# Patient Record
Sex: Male | Born: 1955 | Race: Black or African American | Hispanic: No | Marital: Single | Smoking: Never smoker
Health system: Southern US, Community
[De-identification: ages and names within clinical notes are randomized; demographics above are authoritative.]

## PROBLEM LIST (undated history)

## (undated) DIAGNOSIS — M1990 Unspecified osteoarthritis, unspecified site: Secondary | ICD-10-CM

## (undated) DIAGNOSIS — I7101 Dissection of ascending aorta: Secondary | ICD-10-CM

## (undated) DIAGNOSIS — I82409 Acute embolism and thrombosis of unspecified deep veins of unspecified lower extremity: Secondary | ICD-10-CM

## (undated) DIAGNOSIS — N200 Calculus of kidney: Secondary | ICD-10-CM

## (undated) DIAGNOSIS — O2230 Deep phlebothrombosis in pregnancy, unspecified trimester: Secondary | ICD-10-CM

## (undated) DIAGNOSIS — E119 Type 2 diabetes mellitus without complications: Secondary | ICD-10-CM

## (undated) DIAGNOSIS — E785 Hyperlipidemia, unspecified: Secondary | ICD-10-CM

## (undated) DIAGNOSIS — I10 Essential (primary) hypertension: Secondary | ICD-10-CM

## (undated) HISTORY — PX: DENTAL SURGERY: SHX609

## (undated) HISTORY — PX: REPAIR THORACIC AORTA: SUR1211

## (undated) HISTORY — PX: CARDIAC SURGERY: SHX584

---

## 1997-07-01 ENCOUNTER — Emergency Department (HOSPITAL_COMMUNITY): Admission: EM | Admit: 1997-07-01 | Discharge: 1997-07-01 | Payer: Self-pay | Admitting: Emergency Medicine

## 1997-08-17 ENCOUNTER — Emergency Department (HOSPITAL_COMMUNITY): Admission: EM | Admit: 1997-08-17 | Discharge: 1997-08-17 | Payer: Self-pay | Admitting: Emergency Medicine

## 1997-09-23 ENCOUNTER — Emergency Department (HOSPITAL_COMMUNITY): Admission: EM | Admit: 1997-09-23 | Discharge: 1997-09-23 | Payer: Self-pay | Admitting: Emergency Medicine

## 1997-09-23 ENCOUNTER — Encounter: Payer: Self-pay | Admitting: Internal Medicine

## 1997-11-14 ENCOUNTER — Encounter: Payer: Self-pay | Admitting: *Deleted

## 1997-11-14 ENCOUNTER — Emergency Department (HOSPITAL_COMMUNITY): Admission: EM | Admit: 1997-11-14 | Discharge: 1997-11-14 | Payer: Self-pay | Admitting: *Deleted

## 1997-12-04 ENCOUNTER — Emergency Department (HOSPITAL_COMMUNITY): Admission: EM | Admit: 1997-12-04 | Discharge: 1997-12-04 | Payer: Self-pay | Admitting: Emergency Medicine

## 1997-12-04 ENCOUNTER — Encounter: Payer: Self-pay | Admitting: Emergency Medicine

## 1998-01-19 ENCOUNTER — Emergency Department (HOSPITAL_COMMUNITY): Admission: EM | Admit: 1998-01-19 | Discharge: 1998-01-19 | Payer: Self-pay | Admitting: Emergency Medicine

## 1998-04-06 ENCOUNTER — Emergency Department (HOSPITAL_COMMUNITY): Admission: EM | Admit: 1998-04-06 | Discharge: 1998-04-06 | Payer: Self-pay | Admitting: Emergency Medicine

## 1998-06-15 ENCOUNTER — Encounter: Payer: Self-pay | Admitting: Emergency Medicine

## 1998-06-15 ENCOUNTER — Emergency Department (HOSPITAL_COMMUNITY): Admission: EM | Admit: 1998-06-15 | Discharge: 1998-06-15 | Payer: Self-pay | Admitting: Emergency Medicine

## 1998-08-19 ENCOUNTER — Emergency Department (HOSPITAL_COMMUNITY): Admission: EM | Admit: 1998-08-19 | Discharge: 1998-08-19 | Payer: Self-pay | Admitting: Emergency Medicine

## 1998-08-19 ENCOUNTER — Encounter: Payer: Self-pay | Admitting: Emergency Medicine

## 1999-08-22 ENCOUNTER — Emergency Department (HOSPITAL_COMMUNITY): Admission: EM | Admit: 1999-08-22 | Discharge: 1999-08-23 | Payer: Self-pay | Admitting: Dentistry

## 2000-03-30 ENCOUNTER — Encounter: Admission: RE | Admit: 2000-03-30 | Discharge: 2000-03-30 | Payer: Self-pay | Admitting: *Deleted

## 2000-07-19 ENCOUNTER — Emergency Department (HOSPITAL_COMMUNITY): Admission: EM | Admit: 2000-07-19 | Discharge: 2000-07-19 | Payer: Self-pay | Admitting: Emergency Medicine

## 2000-07-19 ENCOUNTER — Encounter: Payer: Self-pay | Admitting: Emergency Medicine

## 2000-08-10 ENCOUNTER — Emergency Department (HOSPITAL_COMMUNITY): Admission: EM | Admit: 2000-08-10 | Discharge: 2000-08-10 | Payer: Self-pay | Admitting: Emergency Medicine

## 2000-08-10 ENCOUNTER — Encounter: Payer: Self-pay | Admitting: Emergency Medicine

## 2001-01-22 ENCOUNTER — Emergency Department (HOSPITAL_COMMUNITY): Admission: EM | Admit: 2001-01-22 | Discharge: 2001-01-22 | Payer: Self-pay | Admitting: Emergency Medicine

## 2001-02-27 ENCOUNTER — Emergency Department (HOSPITAL_COMMUNITY): Admission: EM | Admit: 2001-02-27 | Discharge: 2001-02-27 | Payer: Self-pay

## 2002-04-10 ENCOUNTER — Emergency Department (HOSPITAL_COMMUNITY): Admission: EM | Admit: 2002-04-10 | Discharge: 2002-04-10 | Payer: Self-pay | Admitting: Emergency Medicine

## 2002-08-22 ENCOUNTER — Emergency Department (HOSPITAL_COMMUNITY): Admission: EM | Admit: 2002-08-22 | Discharge: 2002-08-22 | Payer: Self-pay

## 2002-08-22 ENCOUNTER — Encounter: Payer: Self-pay | Admitting: Emergency Medicine

## 2009-11-29 ENCOUNTER — Emergency Department (HOSPITAL_COMMUNITY): Admission: EM | Admit: 2009-11-29 | Discharge: 2009-11-29 | Payer: Self-pay | Admitting: Emergency Medicine

## 2011-06-17 DIAGNOSIS — Z86718 Personal history of other venous thrombosis and embolism: Secondary | ICD-10-CM | POA: Insufficient documentation

## 2011-06-17 DIAGNOSIS — I82401 Acute embolism and thrombosis of unspecified deep veins of right lower extremity: Secondary | ICD-10-CM | POA: Insufficient documentation

## 2012-04-19 DIAGNOSIS — M1612 Unilateral primary osteoarthritis, left hip: Secondary | ICD-10-CM | POA: Insufficient documentation

## 2012-06-23 DIAGNOSIS — J811 Chronic pulmonary edema: Secondary | ICD-10-CM | POA: Insufficient documentation

## 2012-06-23 DIAGNOSIS — J9691 Respiratory failure, unspecified with hypoxia: Secondary | ICD-10-CM | POA: Insufficient documentation

## 2012-06-26 DIAGNOSIS — I2699 Other pulmonary embolism without acute cor pulmonale: Secondary | ICD-10-CM | POA: Insufficient documentation

## 2013-05-28 DIAGNOSIS — I10 Essential (primary) hypertension: Secondary | ICD-10-CM | POA: Insufficient documentation

## 2013-10-04 DIAGNOSIS — I7101 Dissection of ascending aorta: Secondary | ICD-10-CM | POA: Insufficient documentation

## 2014-02-06 DIAGNOSIS — E118 Type 2 diabetes mellitus with unspecified complications: Secondary | ICD-10-CM | POA: Insufficient documentation

## 2014-04-24 DIAGNOSIS — K219 Gastro-esophageal reflux disease without esophagitis: Secondary | ICD-10-CM | POA: Insufficient documentation

## 2014-04-24 DIAGNOSIS — Z7901 Long term (current) use of anticoagulants: Secondary | ICD-10-CM | POA: Insufficient documentation

## 2014-06-04 DIAGNOSIS — I7101 Dissection of thoracic aorta: Secondary | ICD-10-CM | POA: Diagnosis not present

## 2014-06-04 DIAGNOSIS — E119 Type 2 diabetes mellitus without complications: Secondary | ICD-10-CM | POA: Insufficient documentation

## 2014-06-04 DIAGNOSIS — R591 Generalized enlarged lymph nodes: Secondary | ICD-10-CM | POA: Diagnosis present

## 2014-06-04 DIAGNOSIS — I10 Essential (primary) hypertension: Secondary | ICD-10-CM | POA: Diagnosis not present

## 2014-06-04 DIAGNOSIS — E65 Localized adiposity: Secondary | ICD-10-CM | POA: Diagnosis not present

## 2014-06-04 NOTE — ED Notes (Signed)
Patient ambulatory to triage with steady gait, without difficulty or distress noted; pt reports noted swelling to left side neck/clavicular area; denies pain or itching

## 2014-06-05 ENCOUNTER — Emergency Department: Payer: Medicaid Other

## 2014-06-05 ENCOUNTER — Emergency Department
Admission: EM | Admit: 2014-06-05 | Discharge: 2014-06-05 | Disposition: A | Discharge status: Home / Self Care | Payer: Medicaid Other | Attending: Emergency Medicine | Admitting: Emergency Medicine

## 2014-06-05 DIAGNOSIS — I7101 Dissection of thoracic aorta: Secondary | ICD-10-CM

## 2014-06-05 DIAGNOSIS — E65 Localized adiposity: Secondary | ICD-10-CM

## 2014-06-05 DIAGNOSIS — I71019 Dissection of thoracic aorta, unspecified: Secondary | ICD-10-CM

## 2014-06-05 HISTORY — DX: Essential (primary) hypertension: I10

## 2014-06-05 HISTORY — DX: Type 2 diabetes mellitus without complications: E11.9

## 2014-06-05 LAB — CBC WITH DIFFERENTIAL/PLATELET
Basophils Absolute: 0 10*3/uL (ref 0–0.1)
Basophils Relative: 1 %
Eosinophils Absolute: 0.2 10*3/uL (ref 0–0.7)
Eosinophils Relative: 3 %
HCT: 36.5 % — ABNORMAL LOW (ref 40.0–52.0)
Hemoglobin: 11.5 g/dL — ABNORMAL LOW (ref 13.0–18.0)
Lymphocytes Relative: 23 %
Lymphs Abs: 1.7 10*3/uL (ref 1.0–3.6)
MCH: 23.9 pg — ABNORMAL LOW (ref 26.0–34.0)
MCHC: 31.5 g/dL — ABNORMAL LOW (ref 32.0–36.0)
MCV: 75.8 fL — ABNORMAL LOW (ref 80.0–100.0)
Monocytes Absolute: 0.7 10*3/uL (ref 0.2–1.0)
Monocytes Relative: 10 %
Neutro Abs: 4.5 10*3/uL (ref 1.4–6.5)
Neutrophils Relative %: 63 %
Platelets: 188 10*3/uL (ref 150–440)
RBC: 4.82 MIL/uL (ref 4.40–5.90)
RDW: 18.8 % — ABNORMAL HIGH (ref 11.5–14.5)
WBC: 7.2 10*3/uL (ref 3.8–10.6)

## 2014-06-05 LAB — BASIC METABOLIC PANEL
Anion gap: 7 (ref 5–15)
BUN: 16 mg/dL (ref 6–20)
CO2: 26 mmol/L (ref 22–32)
Calcium: 9.3 mg/dL (ref 8.9–10.3)
Chloride: 107 mmol/L (ref 101–111)
Creatinine, Ser: 1.18 mg/dL (ref 0.61–1.24)
GFR calc Af Amer: >60 mL/min (ref >60)
GFR calc non Af Amer: >60 mL/min (ref >60)
Glucose, Bld: 118 mg/dL — ABNORMAL HIGH (ref 65–99)
Potassium: 4 mmol/L (ref 3.5–5.1)
Sodium: 140 mmol/L (ref 135–145)

## 2014-06-05 LAB — PROTIME-INR
INR: 2.98
PROTHROMBIN TIME: 31 s — AB (ref 11.4–15.0)

## 2014-06-05 LAB — POCT RAPID STREP A: Strep A Ag: NOT DETECTED

## 2014-06-05 MED ORDER — IOHEXOL 300 MG/ML  SOLN
100.0000 mL | Freq: Once | INTRAMUSCULAR | Status: AC | PRN
  Administered 2014-06-05: 100 mL via INTRAVENOUS

## 2014-06-05 MED ORDER — SODIUM CHLORIDE 0.9 % IV BOLUS (SEPSIS)
500.0000 mL | Freq: Once | INTRAVENOUS | Status: AC
  Administered 2014-06-05: 500 mL via INTRAVENOUS

## 2014-06-05 NOTE — ED Notes (Signed)
Received report from Laura RN.

## 2014-06-05 NOTE — Discharge Instructions (Signed)
The lump near your left collarbone is a collection of fat and not infection. The CT scan also shows that you have a chronic aortic dissection. Please keep your appointment with your heart surgeon next Monday. Return to the ER for chest pain, difficulty breathing, feeling faint or other concerns.  Aortic Dissection Aortic dissection happens when there is a tear in the inner wall of the aorta. The aorta is the largest blood vessel in the body. It carries blood from the heart to the arteries, and those arteries send blood to all parts of the body. When there is a tear, the blood enters inside the aortic wall and creates a new space for the blood. This causes the aorta to split even more. The collection of blood into this new space may lower the amount of blood that reaches the rest of the body. A dissection causes the aortic wall to be weakened and can cause rupture. Death can occur. It is critical to have medical care right away. CAUSES  Aortic dissection is caused by two things:  A small tear that develops in a weak or injured part of the aortic wall.  The tear spreads inside the aortic wall and the aorta becomes "double-barreled." The following increase the risk of aortic dissection:  High blood pressure.  Hardening of the walls of arteries.  Use of cocaine.  Blunt injury to the chest.  Genetic disorders that affect the connective tissue (one example is Marfan syndrome).  Problems (such as infection) that affect either the aorta or the heart valve.  Problems that affect the tissue that holds together different parts of your body (connective tissue). For example, a few uncommon arthritis problems can increase the chance of an aortic dissection. SYMPTOMS   Sudden onset of severe chest pain. The pain may be sharp, stabbing, or ripping in nature.  The pain may then shift to the shoulder, arm, neck, jaw, abdomen, or hips. Other symptoms may include:  Breathing trouble.  Weakness of any  part of the body.  Decreased feeling of any part of the body.  Fainting.  Dizziness.  Feeling sick to your stomach (nausea) and vomiting.  Sweating a lot.  Confusion/dazed. DIAGNOSIS  Testing may include the following:  Electrocardiogram.  Chest x-ray.  Imaging study done after injecting a dye to clearly view the blood vessel (aortic angiography).  Specialized x-ray of the chest is done after injecting a dye (CT scan) or an MRI.  A test where the image of the heart is studied using sound waves (Echocardiogram). TREATMENT  Treatment will vary depending on what part of the aorta has the problem. Your caregiver may admit you into an intensive care unit. Medicines that reduce the blood pressure and strong painkillers may be given right away. In many cases, heart medicines may be given to relieve the symptoms and help blood pressure.  If medicines are used first and they do not help, then surgery may be done to repair or replace the damaged portion of the aorta. In some cases, surgery is needed right away. You may have to stay in the hospital for 7-10 days. If the aortic valve is damaged due to aortic dissection, repair or replacement of the valve may be done. If the arteries of the heart are affected, bypass surgery of the heart may be done. THIS IS AN EMERGENCY. Call your local emergency services (911 in [place] right away or the closest medical facility.  Document Released: 03/29/2007 Document Revised: 10/10/2012 Document Reviewed: 03/29/2007 [employer]  Patient Information 2015 [place]. This information is not intended to replace advice given to you by your health care provider. Make sure you discuss any questions you have with your health care provider.

## 2014-06-05 NOTE — ED Notes (Signed)
Pt laying quietly in stretcher, resp even and unlabored, NAD.

## 2014-06-05 NOTE — ED Notes (Signed)
Pt resting quietly at this time, resp even and unlabored.

## 2014-06-05 NOTE — ED Provider Notes (Signed)
-----------------------------------------   7:28 AM on 06/05/2014 -----------------------------------------  Patient care is him from Dr.Sung.  Patient's CT of his chest incidentally shows an aortic dissection, upon receiving [facility] this appears largely unchanged since 01/08/14. Patient denies any chest pain. Patient has a follow-up appointment with his cardiologist on Monday. Patient to follow-up as scheduled. We will discharge the patient home at this time.  Harvest Dark, MD 06/05/14 0730

## 2014-06-05 NOTE — ED Provider Notes (Signed)
[facility] Emergency Department Provider Note  ____________________________________________  Time seen: Approximately 3:19 AM  I have reviewed the triage vital signs and the nursing notes.   HISTORY  Chief Complaint Lymphadenopathy    HPI Shane Bean is a 59 y.o. male who presents with bilateral neck swelling, left greater than right. Patient states he incidentally noticed the swelling tonight while scratching his neck. Denies trauma/injury. Denies pain. Complains of recent sore throat. Denies fever, chills, breathing difficulty, chest pain, abdominal pain, vomiting, diarrhea, headache, weakness, numbness, tingling.Denies difficulty swallowing.   Past medical history  Diabetes mellitus without complication [B09.6]      Hypertension [I10]     Pertinent Negatives Date Noted Comments   Asthma [J45.909] 06/05/2014    Cancer [C80.1] 06/05/2014    Collagen vascular disease [M35.9, M31.0] 06/05/2014    CHF (congestive heart failure) [I50.9] 06/05/2014    Multiple myeloma [C90.00] 06/05/2014    Renal insufficiency [N28.9] 06/05/2014    Sickle cell anemia [D57.1] 06/05/2014   Gout   There are no active problems to display for this patient.   Past surgical history Thoracic aneurysm surgery   No current outpatient prescriptions on file.  Allergies Review of patient's allergies indicates no known allergies.  No family history on file.  Social History History  Substance Use Topics  . Smoking status: Not on file  . Smokeless tobacco: Not on file  . Alcohol Use: Not on file  Nonsmoker  Review of Systems Constitutional: No fever/chills Eyes: No visual changes. ENT: Positive for sore throat. Positive for neck swelling. Cardiovascular: Denies chest pain. Respiratory: Denies shortness of breath. Gastrointestinal: No abdominal pain.  No nausea, no vomiting.  No diarrhea.  No constipation. Genitourinary: Negative for dysuria. Musculoskeletal:  Negative for back pain. Skin: Negative for rash. Neurological: Negative for headaches, focal weakness or numbness.  10-point ROS otherwise negative.  ____________________________________________   PHYSICAL EXAM:  VITAL SIGNS: ED Triage Vitals  Enc Vitals Group     BP 06/04/14 2355 126/83 mmHg     Pulse Rate 06/04/14 2355 69     Resp 06/04/14 2355 18     Temp 06/04/14 2355 98.1 F (36.7 C)     Temp Source 06/04/14 2355 Oral     SpO2 06/04/14 2355 98 %     Weight 06/04/14 2355 298 lb (135.172 kg)     Height 06/04/14 2355 $RemoveBefor'5\' 10"'zQvLVGxEwaVD$  (1.778 m)     Head Cir --      Peak Flow --      Pain Score --      Pain Loc --      Pain Edu? --      Excl. in [place]? --     Constitutional: Alert and oriented. Well appearing and in no acute distress. Eyes: Conjunctivae are normal. PERRL. EOMI. Head: Atraumatic. Nose: No congestion/rhinnorhea. Mouth/Throat: Mucous membranes are moist.  Oropharynx non-erythematous. Neck: No stridor. Bilateral super clavicular masses which are soft and nontender; left greater than right.  Hematological/Lymphatic/Immunilogical: No cervical lymphadenopathy. Cardiovascular: Normal rate, regular rhythm. Grossly normal heart sounds.  Good peripheral circulation. Respiratory: Normal respiratory effort.  No retractions. Lungs CTAB. Gastrointestinal: Soft and nontender. No distention. No abdominal bruits. No CVA tenderness. Musculoskeletal: No lower extremity tenderness nor edema.  No joint effusions. Neurologic:  Normal speech and language. No gross focal neurologic deficits are appreciated. Speech is normal. No gait instability. Skin:  Skin is warm, dry and intact. No rash noted. Psychiatric: Mood and affect are normal. Speech  and behavior are normal.  ____________________________________________   LABS (all labs ordered are listed, but only abnormal results are displayed)  Labs Reviewed  CBC WITH DIFFERENTIAL/PLATELET - Abnormal; Notable for the following:     Hemoglobin 11.5 (*)    HCT 36.5 (*)    MCV 75.8 (*)    MCH 23.9 (*)    MCHC 31.5 (*)    RDW 18.8 (*)    All other components within normal limits  BASIC METABOLIC PANEL - Abnormal; Notable for the following:    Glucose, Bld 118 (*)    All other components within normal limits  PROTIME-INR - Abnormal; Notable for the following:    Prothrombin Time 31.0 (*)    All other components within normal limits   ____________________________________________  EKG  None ____________________________________________  RADIOLOGY  CT neck discussed with Dr. Dorann Lodge:  Prominent symmetric supraclavicular fat corresponding to palpable abnormality with acute process in the neck.  Ascending aortic dissection (Stanford type A), extending into the RIGHT subclavian artery and descending aorta is likely chronic though, would be better characterized on CT angiogram of the chest as clinically indicated.  Enlarged main pulmonary artery can be seen with chronic pulmonary arterial hypertension. _________________________________________   PROCEDURES  Procedure(s) performed: None  Critical Care performed: No  ____________________________________________   INITIAL IMPRESSION / ASSESSMENT AND PLAN / ED COURSE  Pertinent labs & imaging results that were available during my care of the patient were reviewed by me and considered in my medical decision making (see chart for details).  59 year old male with bilateral neck swelling, left greater than right. Will obtain rapid strep test, CT neck to evaluate etiology of masses.  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 Patient believes he has a chronic aortic dissection. 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 States he is due to get his Coumadin level  checked tomorrow as well as an appointment with his vascular surgeon on Monday. While he is here this morning we will check INR level. I am unable to view patient's records and care everywhere; medical record consent signed and requests fax to [facility] for patient's medical records, specifically recent CT results. Patient resting in no acute distress and denies chest pain or shortness of breath.  ----------------------------------------- 7:07 AM on 06/05/2014 -----------------------------------------  Pt resting in NAD. Awaiting medical records from [facility] discharge home if CT results stable. Care transferred to Dr. Kerman Passey. ____________________________________________   FINAL CLINICAL IMPRESSION(S) / ED DIAGNOSES  Final diagnoses:  Fat pad  Chronic thoracic aortic dissection      Paulette Blanch, MD 06/05/14 [phone]

## 2014-06-05 NOTE — ED Notes (Signed)
Lab stated they did not have blue top for PT-INR, RN to redraw.

## 2014-06-05 NOTE — ED Notes (Signed)
Records requested from [facility], consent signed by patient

## 2014-06-08 LAB — CULTURE, GROUP A STREP (THRC)

## 2014-09-20 ENCOUNTER — Emergency Department: Payer: Medicaid Other

## 2014-09-20 ENCOUNTER — Emergency Department
Admission: EM | Admit: 2014-09-20 | Discharge: 2014-09-20 | Disposition: A | Discharge status: Home / Self Care | Payer: Medicaid Other | Attending: Emergency Medicine | Admitting: Emergency Medicine

## 2014-09-20 ENCOUNTER — Encounter: Payer: Self-pay | Admitting: Emergency Medicine

## 2014-09-20 DIAGNOSIS — I10 Essential (primary) hypertension: Secondary | ICD-10-CM | POA: Insufficient documentation

## 2014-09-20 DIAGNOSIS — R319 Hematuria, unspecified: Secondary | ICD-10-CM

## 2014-09-20 DIAGNOSIS — E119 Type 2 diabetes mellitus without complications: Secondary | ICD-10-CM | POA: Insufficient documentation

## 2014-09-20 DIAGNOSIS — Z87891 Personal history of nicotine dependence: Secondary | ICD-10-CM | POA: Insufficient documentation

## 2014-09-20 DIAGNOSIS — N390 Urinary tract infection, site not specified: Secondary | ICD-10-CM | POA: Insufficient documentation

## 2014-09-20 HISTORY — DX: Calculus of kidney: N20.0

## 2014-09-20 LAB — CBC
HEMATOCRIT: 39.8 % — AB (ref 40.0–52.0)
Hemoglobin: 12.8 g/dL — ABNORMAL LOW (ref 13.0–18.0)
MCH: 25.5 pg — AB (ref 26.0–34.0)
MCHC: 32.1 g/dL (ref 32.0–36.0)
MCV: 79.5 fL — ABNORMAL LOW (ref 80.0–100.0)
Platelets: 197 10*3/uL (ref 150–440)
RBC: 5.01 MIL/uL (ref 4.40–5.90)
RDW: 17.1 % — AB (ref 11.5–14.5)
WBC: 8.2 10*3/uL (ref 3.8–10.6)

## 2014-09-20 LAB — URINALYSIS COMPLETE WITH MICROSCOPIC (ARMC ONLY)
BACTERIA UA: NONE SEEN
Bilirubin Urine: NEGATIVE
Glucose, UA: NEGATIVE mg/dL
Ketones, ur: NEGATIVE mg/dL
Nitrite: NEGATIVE
Protein, ur: 30 mg/dL — AB
Specific Gravity, Urine: 1.021 (ref 1.005–1.030)
pH: 5 (ref 5.0–8.0)

## 2014-09-20 LAB — BASIC METABOLIC PANEL
ANION GAP: 10 (ref 5–15)
BUN: 16 mg/dL (ref 6–20)
CO2: 24 mmol/L (ref 22–32)
CREATININE: 1.19 mg/dL (ref 0.61–1.24)
Calcium: 9.5 mg/dL (ref 8.9–10.3)
Chloride: 102 mmol/L (ref 101–111)
GFR calc Af Amer: >60 mL/min (ref >60)
GLUCOSE: 205 mg/dL — AB (ref 65–99)
Potassium: 4.4 mmol/L (ref 3.5–5.1)
Sodium: 136 mmol/L (ref 135–145)

## 2014-09-20 MED ORDER — LEVOFLOXACIN 500 MG PO TABS
500.0000 mg | ORAL_TABLET | Freq: Once | ORAL | Status: AC
  Administered 2014-09-20: 500 mg via ORAL
  Filled 2014-09-20: qty 1

## 2014-09-20 MED ORDER — LEVOFLOXACIN 500 MG PO TABS: 500.0000 mg | ORAL_TABLET | Freq: Every day | ORAL | Status: AC

## 2014-09-20 NOTE — Discharge Instructions (Signed)

## 2014-09-20 NOTE — ED Notes (Signed)
Assumed pt care at this time. NAD noted. RR even and nonlabored. Will continue to monitor. 

## 2014-09-20 NOTE — ED Notes (Signed)
Pt ambulatory to and from restroom without incident.

## 2014-09-20 NOTE — ED Notes (Signed)
Denies pain with urination

## 2014-09-20 NOTE — ED Provider Notes (Signed)
[facility] Emergency Department Provider Note  Time seen: 6:54 PM  I have reviewed the triage vital signs and the nursing notes.   HISTORY  Chief Complaint Hematuria    HPI Shane Bean is a 59 y.o. male with a past medical history of diabetes, hypertension, kidney stones. Just the emergency department with hematuria. According to the patient since this morning he has noted blood in his urine as well as some mild left flank pain. Patient states he has a history of kidney stones but has not had one in many many years. Denies any dysuria. Denies any abdominal pain. Denies any nausea, vomiting, fever. Denies diarrhea. Describes his abdominal pain is minimal to mild, dull pain in the left flank.   Past Medical History  Diagnosis Date  . Diabetes mellitus without complication   . Hypertension   . Kidney stones     There are no active problems to display for this patient.   Past Surgical History  Procedure Laterality Date  . Repair thoracic aorta      No current outpatient prescriptions on file.  Allergies Lisinopril  No family history on file.  Social History Social History  Substance Use Topics  . Smoking status: Former Research scientist (life sciences)  . Smokeless tobacco: None  . Alcohol Use: Yes    Review of Systems Constitutional: Negative for fever. Cardiovascular: Negative for chest pain. Respiratory: Negative for shortness of breath. Gastrointestinal: Mild left flank pain. Negative for nausea, vomiting, diarrhea. Genitourinary: Negative for dysuria. Positive for gross hematuria. Musculoskeletal: Negative for back pain. Neurological: Negative for headache 10-point ROS otherwise negative.  ____________________________________________   PHYSICAL EXAM:  VITAL SIGNS: ED Triage Vitals  Enc Vitals Group     BP 09/20/14 1642 139/76 mmHg     Pulse Rate 09/20/14 1642 69     Resp 09/20/14 1642 20     Temp 09/20/14 1642 98.8 F (37.1 C)     Temp Source  09/20/14 1642 Oral     SpO2 09/20/14 1642 94 %     Weight 09/20/14 1642 300 lb (136.079 kg)     Height 09/20/14 1642 5\' 10"  (1.778 m)     Head Cir --      Peak Flow --      Pain Score --      Pain Loc --      Pain Edu? --      Excl. in [place]? --     Constitutional: Alert and oriented. Well appearing and in no distress. Eyes: Normal exam ENT   Mouth/Throat: Mucous membranes are moist. Cardiovascular: Normal rate, regular rhythm. No murmur Respiratory: Normal respiratory effort without tachypnea nor retractions. Breath sounds are clear and equal bilaterally. No wheezes/rales/rhonchi. Gastrointestinal: Soft and nontender. No distention.  There is no CVA tenderness. Genitourinary: Minimal amount of bleeding from the urethral meatus, genitals otherwise appear normal, nontender. Musculoskeletal: Nontender with normal range of motion in all extremities. No lower extremity tenderness or edema. Neurologic:  Normal speech and language. No gross focal neurologic deficits are appreciated. Speech is normal. Skin:  Skin is warm, dry and intact.  Psychiatric: Mood and affect are normal. Speech and behavior are normal. Patient exhibits appropriate insight and judgment.  ____________________________________________   RADIOLOGY  CT shows no acute abnormality.  ____________________________________________    INITIAL IMPRESSION / ASSESSMENT AND PLAN / ED COURSE  Pertinent labs & imaging results that were available during my care of the patient were reviewed by me and considered in my medical  decision making (see chart for details).  Patient with mild left flank pain and hematuria since this morning. Labs are largely within normal limits besides hematuria on urinalysis. We'll obtain a noncontrasted CT scan to evaluate for ureterolithiasis.  CT shows no acute abnormalities. Discussed with the patient the need to follow-up with a urologist. Patient is agreeable. We'll discharge the patient on  Levaquin for possible urinary tract infection. Patient agreeable.  ____________________________________________   FINAL CLINICAL IMPRESSION(S) / ED DIAGNOSES  Hematuria UTI  Harvest Dark, MD 09/20/14 2021

## 2014-09-24 ENCOUNTER — Emergency Department
Admission: EM | Admit: 2014-09-24 | Discharge: 2014-09-24 | Disposition: A | Discharge status: Home / Self Care | Payer: Self-pay | Attending: Emergency Medicine | Admitting: Emergency Medicine

## 2014-09-24 ENCOUNTER — Encounter: Payer: Self-pay | Admitting: Emergency Medicine

## 2014-09-24 DIAGNOSIS — M542 Cervicalgia: Secondary | ICD-10-CM | POA: Insufficient documentation

## 2014-09-24 DIAGNOSIS — E119 Type 2 diabetes mellitus without complications: Secondary | ICD-10-CM | POA: Insufficient documentation

## 2014-09-24 DIAGNOSIS — Z792 Long term (current) use of antibiotics: Secondary | ICD-10-CM | POA: Insufficient documentation

## 2014-09-24 DIAGNOSIS — M436 Torticollis: Secondary | ICD-10-CM | POA: Insufficient documentation

## 2014-09-24 DIAGNOSIS — I10 Essential (primary) hypertension: Secondary | ICD-10-CM | POA: Insufficient documentation

## 2014-09-24 DIAGNOSIS — Z87891 Personal history of nicotine dependence: Secondary | ICD-10-CM | POA: Insufficient documentation

## 2014-09-24 DIAGNOSIS — N61 Inflammatory disorders of breast: Secondary | ICD-10-CM | POA: Insufficient documentation

## 2014-09-24 MED ORDER — CEPHALEXIN 500 MG PO CAPS: 500.0000 mg | ORAL_CAPSULE | Freq: Four times a day (QID) | ORAL | Status: DC

## 2014-09-24 MED ORDER — CYCLOBENZAPRINE HCL 5 MG PO TABS: 5.0000 mg | ORAL_TABLET | Freq: Three times a day (TID) | ORAL | Status: DC | PRN

## 2014-09-24 NOTE — ED Notes (Signed)
Patient to ER for c/o pain and swelling to area between neck and clavicles. Patient states pain sometimes goes from ears down to area. Also c/o stiffness in neck when waking in am. States he was seen at time symptoms first began approx 2 months ago and had negative CT scan.

## 2014-09-24 NOTE — ED Notes (Signed)
States he developed some neck to neck about 2 months ago.states pain is going from ears into clavicle areas . Swelling noted

## 2014-09-24 NOTE — Discharge Instructions (Signed)
Cellulitis Cellulitis is an infection of the skin and the tissue beneath it. The infected area is usually red and tender. Cellulitis occurs most often in the arms and lower legs.  CAUSES  Cellulitis is caused by bacteria that enter the skin through cracks or cuts in the skin. The most common types of bacteria that cause cellulitis are staphylococci and streptococci. SIGNS AND SYMPTOMS   Redness and warmth.  Swelling.  Tenderness or pain.  Fever. DIAGNOSIS  Your health care provider can usually determine what is wrong based on a physical exam. Blood tests may also be done. TREATMENT  Treatment usually involves taking an antibiotic medicine. HOME CARE INSTRUCTIONS   Take your antibiotic medicine as directed by your health care provider. Finish the antibiotic even if you start to feel better.  Keep the infected arm or leg elevated to reduce swelling.  Apply a warm cloth to the affected area up to 4 times per day to relieve pain.  Take medicines only as directed by your health care provider.  Keep all follow-up visits as directed by your health care provider. SEEK MEDICAL CARE IF:   You notice red streaks coming from the infected area.  Your red area gets larger or turns dark in color.  Your bone or joint underneath the infected area becomes painful after the skin has healed.  Your infection returns in the same area or another area.  You notice a swollen bump in the infected area.  You develop new symptoms.  You have a fever. SEEK IMMEDIATE MEDICAL CARE IF:   You feel very sleepy.  You develop vomiting or diarrhea.  You have a general ill feeling (malaise) with muscle aches and pains. MAKE SURE YOU:   Understand these instructions.  Will watch your condition.  Will get help right away if you are not doing well or get worse. Document Released: 09/29/2004 Document Revised: 05/06/2013 Document Reviewed: 03/07/2011 [facility] Patient Information 2015 [place], [place].  This information is not intended to replace advice given to you by your health care provider. Make sure you discuss any questions you have with your health care provider.  Torticollis, Acute You have suddenly (acutely) developed a twisted neck (torticollis). This is usually a self-limited condition. CAUSES  Acute torticollis may be caused by malposition, trauma or infection. Most commonly, acute torticollis is caused by sleeping in an awkward position. Torticollis may also be caused by the flexion, extension or twisting of the neck muscles beyond their normal position. Sometimes, the exact cause may not be known. SYMPTOMS  Usually, there is pain and limited movement of the neck. Your neck may twist to one side. DIAGNOSIS  The diagnosis is often made by physical examination. X-rays, CT scans or MRIs may be done if there is a history of trauma or concern of infection. TREATMENT  For a common, stiff neck that develops during sleep, treatment is focused on relaxing the contracted neck muscle. Medications (including shots) may be used to treat the problem. Most cases resolve in several days. Torticollis usually responds to conservative physical therapy. If left untreated, the shortened and spastic neck muscle can cause deformities in the face and neck. Rarely, surgery is required. HOME CARE INSTRUCTIONS   Use over-the-counter and prescription medications as directed by your caregiver.  Do stretching exercises and massage the neck as directed by your caregiver.  Follow up with physical therapy if needed and as directed by your caregiver. SEEK IMMEDIATE MEDICAL CARE IF:   You develop difficulty breathing or  noisy breathing (stridor).  You drool, develop trouble swallowing or have pain with swallowing.  You develop numbness or weakness in the hands or feet.  You have changes in speech or vision.  You have problems with urination or bowel movements.  You have difficulty walking.  You have a  fever.  You have increased pain. MAKE SURE YOU:   Understand these instructions.  Will watch your condition.  Will get help right away if you are not doing well or get worse. Document Released: 12/18/1999 Document Revised: 03/14/2011 Document Reviewed: 01/28/2009 [facility] Patient Information 2015 [place], [place]. This information is not intended to replace advice given to you by your health care provider. Make sure you discuss any questions you have with your health care provider.   Your CT scan from June did not show any bony changes to account for your neck pain. The fullness you note above your collar bones is found to be subcutaneous fat on the CT scan. You should take the prescription meds as directed for the infection in your nipple and your neck pain as directed. Apply warm compresses to the nipple as discussed. Follow-up with your provider or return as needed for further care.

## 2014-09-24 NOTE — ED Provider Notes (Signed)
[facility] Emergency Department Provider Note ____________________________________________  Time seen: 1247  I have reviewed the triage vital signs and the nursing notes.  HISTORY  Chief Complaint  Neck Pain  HPI Shane Bean is a 59 y.o. male reports to the ED for evaluation of ongoing neck pain over the last 2 months. He also reports continued swelling in the supraclavicular fossae, for which he was evaluated at that time.He has not followed up with his primary care provider as discussed. He reports that he is required to me a very high deductible with his insurance, was advised to report to the ED for any interim medical concerns. He reports pain to the posterior aspect of the neck that seemed to be worse at night as well as some stiffness with rotation of the neck. He denies any distal paresthesias, recent head injury, or neck trauma.  Past Medical History  Diagnosis Date  . Diabetes mellitus without complication   . Hypertension   . Kidney stones     There are no active problems to display for this patient.   Past Surgical History  Procedure Laterality Date  . Repair thoracic aorta      Current Outpatient Rx  Name  Route  Sig  Dispense  Refill  . cephALEXin (KEFLEX) 500 MG capsule   Oral   Take 1 capsule (500 mg total) by mouth 4 (four) times daily.   40 capsule   0   . cyclobenzaprine (FLEXERIL) 5 MG tablet   Oral   Take 1 tablet (5 mg total) by mouth every 8 (eight) hours as needed for muscle spasms.   12 tablet   0   . levofloxacin (LEVAQUIN) 500 MG tablet   Oral   Take 1 tablet (500 mg total) by mouth daily.   10 tablet   0    Allergies Lisinopril  No family history on file.  Social History Social History  Substance Use Topics  . Smoking status: Former Research scientist (life sciences)  . Smokeless tobacco: None  . Alcohol Use: Yes   Review of Systems  Constitutional: Negative for fever. Eyes: Negative for visual changes. ENT: Negative for  sore throat. Cardiovascular: Negative for chest pain. Respiratory: Negative for shortness of breath. Gastrointestinal: Negative for abdominal pain, vomiting and diarrhea. Genitourinary: Negative for dysuria. Musculoskeletal: Negative for back pain. Reports neck pain posteriorly. Skin: Negative for rash. Neurological: Negative for headaches, focal weakness or numbness. ____________________________________________  PHYSICAL EXAM:  VITAL SIGNS: ED Triage Vitals  Enc Vitals Group     BP 09/24/14 1200 138/76 mmHg     Pulse Rate 09/24/14 1200 72     Resp 09/24/14 1200 18     Temp 09/24/14 1200 98.9 F (37.2 C)     Temp Source 09/24/14 1200 Oral     SpO2 09/24/14 1200 96 %     Weight 09/24/14 1200 300 lb (136.079 kg)     Height 09/24/14 1200 5\' 10"  (1.778 m)     Head Cir --      Peak Flow --      Pain Score 09/24/14 1202 5     Pain Loc --      Pain Edu? --      Excl. in [place]? --    Constitutional: Alert and oriented. Well appearing and in no distress. Eyes: Conjunctivae are normal. PERRL. Normal extraocular movements. ENT   Head: Normocephalic and atraumatic.   Nose: No congestion/rhinorrhea.   Mouth/Throat: Mucous membranes are moist.   Neck:  Supple. No thyromegaly. Soft tissue swelling appreciated to the bilateral supraclavicular fossa, without indication of an acute process. Hematological/Lymphatic/Immunological: No cervical lymphadenopathy. Cardiovascular: Normal rate, regular rhythm.  Respiratory: Normal respiratory effort. No wheezes/rales/rhonchi. Gastrointestinal: Soft and nontender. No distention. Musculoskeletal: Normal spinal alignment without spasm, deformity, or step-off. He notes some decreased left neck rotation range of motion. Otherwise neck is supple without any palpable rigidity. Nontender with normal range of motion in all extremities.  Neurologic:  Normal gait without ataxia. Normal speech and language. No gross focal neurologic deficits are  appreciated. Skin:  Skin is warm, dry and intact. No rash noted. Psychiatric: Mood and affect are normal. Patient exhibits appropriate insight and judgment. ____________________________________________   RADIOLOGY  Soft Tissue Neck CT (06/05/14) IMPRESSION: Prominent symmetric supraclavicular fat corresponding to palpable abnormality with acute process in the neck. ____________________________________________  INITIAL IMPRESSION / ASSESSMENT AND PLAN / ED COURSE  Review of the previous soft tissue CT from June was reviewed with the patient. Reassurance to him that there are no significant findings on the CT except for a normal subcutaneous fat signaled to the area of concern at the collarbones. The posterior neck pain seems to be consistent with musculoskeletal strain. He'll be treated with anti-spasm medication as needed. He may dose over-the-counter anti-inflammatories as needed. Is also being treated for an acute left nipple cellulitis. He will follow with his primary care provider as needed. ____________________________________________  FINAL CLINICAL IMPRESSION(S) / ED DIAGNOSES  Final diagnoses:  Posterior neck pain  Wry neck  Cellulitis of areola or nipple of male      Melvenia Needles, PA-C 09/24/14 1546  Earleen Newport, MD 09/25/14 [phone]

## 2015-01-14 DIAGNOSIS — R5383 Other fatigue: Secondary | ICD-10-CM | POA: Insufficient documentation

## 2015-01-14 DIAGNOSIS — R058 Other specified cough: Secondary | ICD-10-CM | POA: Insufficient documentation

## 2015-01-14 DIAGNOSIS — E669 Obesity, unspecified: Secondary | ICD-10-CM | POA: Insufficient documentation

## 2015-01-14 DIAGNOSIS — R05 Cough: Secondary | ICD-10-CM | POA: Insufficient documentation

## 2015-06-28 ENCOUNTER — Encounter: Payer: Self-pay | Admitting: Emergency Medicine

## 2015-06-28 ENCOUNTER — Emergency Department
Admission: EM | Admit: 2015-06-28 | Discharge: 2015-06-28 | Disposition: A | Discharge status: Home / Self Care | Payer: Self-pay | Attending: Emergency Medicine | Admitting: Emergency Medicine

## 2015-06-28 DIAGNOSIS — E119 Type 2 diabetes mellitus without complications: Secondary | ICD-10-CM | POA: Insufficient documentation

## 2015-06-28 DIAGNOSIS — I10 Essential (primary) hypertension: Secondary | ICD-10-CM | POA: Insufficient documentation

## 2015-06-28 DIAGNOSIS — L03011 Cellulitis of right finger: Secondary | ICD-10-CM | POA: Insufficient documentation

## 2015-06-28 MED ORDER — MUPIROCIN 2 % EX OINT: TOPICAL_OINTMENT | CUTANEOUS | Status: AC

## 2015-06-28 MED ORDER — OXYCODONE-ACETAMINOPHEN 5-325 MG PO TABS: 1.0000 | ORAL_TABLET | ORAL | Status: DC | PRN

## 2015-06-28 MED ORDER — AMOXICILLIN-POT CLAVULANATE 875-125 MG PO TABS: 1.0000 | ORAL_TABLET | Freq: Two times a day (BID) | ORAL | Status: AC

## 2015-06-28 NOTE — ED Provider Notes (Signed)
[facility] Emergency Department Provider Note  ____________________________________________  Time seen: Approximately 1:34 PM  I have reviewed the triage vital signs and the nursing notes.   HISTORY  Chief Complaint Hand Pain    HPI Shane Bean is a 60 y.o. male presents for evaluation of swollen right index finger. Patient has noticed the fluid just inferior to the nail bed and to the medial aspect of his finger. 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 Reports pain is 7/10.   Past Medical History  Diagnosis Date  . Diabetes mellitus without complication ([place])   . Hypertension   . Kidney stones     There are no active problems to display for this patient.   Past Surgical History  Procedure Laterality Date  . Repair thoracic aorta      Current Outpatient Rx  Name  Route  Sig  Dispense  Refill  . amoxicillin-clavulanate (AUGMENTIN) 875-125 MG tablet   Oral   Take 1 tablet by mouth 2 (two) times daily.   20 tablet   0   . mupirocin ointment (BACTROBAN) 2 %      Apply to affected area 3 times daily   22 g   0   . oxyCODONE-acetaminophen (ROXICET) 5-325 MG tablet   Oral   Take 1-2 tablets by mouth every 4 (four) hours as needed for severe pain.   15 tablet   0     Allergies Lisinopril  History reviewed. No pertinent family history.  Social History Social History  Substance Use Topics  . Smoking status: Never Smoker   . Smokeless tobacco: None  . Alcohol Use: Yes    Review of Systems Constitutional: No fever/chills Cardiovascular: Denies chest pain. Respiratory: Denies shortness of breath. Gastrointestinal: No abdominal pain.  No nausea, no vomiting.  No diarrhea.  No constipation. Genitourinary: Negative for dysuria. Musculoskeletal: Negative for back pain. Skin: Positive for right index fingertip swelling. Neurological: Negative for headaches, focal weakness or  numbness.  10-point ROS otherwise negative.  ____________________________________________   PHYSICAL EXAM:  VITAL SIGNS: ED Triage Vitals  Enc Vitals Group     BP 06/28/15 1329 139/79 mmHg     Pulse Rate 06/28/15 1329 72     Resp 06/28/15 1329 19     Temp 06/28/15 1329 98.3 F (36.8 C)     Temp src --      SpO2 06/28/15 1329 96 %     Weight 06/28/15 1329 290 lb (131.543 kg)     Height 06/28/15 1329 5\' 10"  (1.778 m)     Head Cir --      Peak Flow --      Pain Score --      Pain Loc --      Pain Edu? --      Excl. in [place]? --     Constitutional: Alert and oriented. Well appearing and in no acute distress. Respiratory: Normal respiratory effort.  No retractions. Lungs CTAB. Gastrointestinal: Soft and nontender. No distention. No abdominal bruits. No CVA tenderness. Musculoskeletal: No lower extremity tenderness nor edema.  No joint effusions. Neurologic:  Normal speech and language. No gross focal neurologic deficits are appreciated. No gait instability. Skin:  Skin is warm, dry and intact. No rash noted.Erythema and edema noted to the distal aspect of the right index finger. Psychiatric: Mood and affect are normal. Speech and behavior are normal.  ____________________________________________   LABS (all labs ordered are listed,  but only abnormal results are displayed)  Labs Reviewed - No data to display ____________________________________________  EKG   ____________________________________________  RADIOLOGY   ____________________________________________   PROCEDURES  Procedure(s) performed: None  Critical Care performed: No  ____________________________________________   INITIAL IMPRESSION / ASSESSMENT AND PLAN / ED COURSE  Pertinent labs & imaging results that were available during my care of the patient were reviewed by me and considered in my medical decision making (see chart for details).  Acute. Neck into the right index finger. Area was  marked with a marker to determine growth in size. Patient placed on Augmentin 875 and mupirocin ointment. Patient given Percocet as needed for pain he is to follow-up with orthopedics for further evaluation and surgical consult needed. ____________________________________________   FINAL CLINICAL IMPRESSION(S) / ED DIAGNOSES  Final diagnoses:  Paronychia, right     This chart was dictated using voice recognition software/Dragon. Despite best efforts to proofread, errors can occur which can change the meaning. Any change was purely unintentional.   Arlyss Repress, PA-C 06/28/15 [place], MD 06/28/15 1356

## 2015-06-28 NOTE — ED Notes (Addendum)
Pt states last week right index finger started swelling.  Swells typically 2-3x per year.  Pt thinks there is an infection under his finger nail. Pt has not seen any drainage at this time. Denies injury.

## 2015-06-28 NOTE — Discharge Instructions (Signed)
Paronychia °Paronychia is an infection of the skin that surrounds a nail. It usually affects the skin around a fingernail, but it may also occur near a toenail. It often causes pain and swelling around the nail. This condition may come on suddenly or develop over a longer period. In some cases, a collection of pus (abscess) can form near or under the nail. Usually, paronychia is not serious and it clears up with treatment. °CAUSES °This condition may be caused by bacteria or fungi. It is commonly caused by either Streptococcus or Staphylococcus bacteria. The bacteria or fungi often cause the infection by getting into the affected area through an opening in the skin, such as a cut or a hangnail. °RISK FACTORS °This condition is more likely to develop in: °· People who get their hands wet often, such as those who work as dishwashers, bartenders, or nurses. °· People who bite their fingernails or suck their thumbs. °· People who trim their nails too short. °· People who have hangnails or injured fingertips. °· People who get manicures. °· People who have diabetes. °SYMPTOMS °Symptoms of this condition include: °· Redness and swelling of the skin near the nail. °· Tenderness around the nail when you touch the area. °· Pus-filled bumps under the cuticle. The cuticle is the skin at the base or sides of the nail. °· Fluid or pus under the nail. °· Throbbing pain in the area. °DIAGNOSIS °This condition is usually diagnosed with a physical exam. In some cases, a sample of pus may be taken from an abscess to be tested in a lab. This can help to determine what type of bacteria or fungi is causing the condition. °TREATMENT °Treatment for this condition depends on the cause and severity of the condition. If the condition is mild, it may clear up on its own in a few days. Your health care provider may recommend soaking the affected area in warm water a few times a day. When treatment is needed, the options may  include: °· Antibiotic medicine, if the condition is caused by a bacterial infection. °· Antifungal medicine, if the condition is caused by a fungal infection. °· Incision and drainage, if an abscess is present. In this procedure, the health care provider will cut open the abscess so the pus can drain out. °HOME CARE INSTRUCTIONS °· Soak the affected area in warm water if directed to do so by your health care provider. You may be told to do this for 20 minutes, 2-3 times a day. Keep the area dry in between soakings. °· Take medicines only as directed by your health care provider. °· If you were prescribed an antibiotic medicine, finish all of it even if you start to feel better. °· Keep the affected area clean. °· Do not try to drain a fluid-filled bump yourself. °· If you will be washing dishes or performing other tasks that require your hands to get wet, wear rubber gloves. You should also wear gloves if your hands might come in contact with irritating substances, such as cleaners or chemicals. °· Follow your health care provider's instructions about: °¨ Wound care. °¨ Bandage (dressing) changes and removal. °SEEK MEDICAL CARE IF: °· Your symptoms get worse or do not improve with treatment. °· You have a fever or chills. °· You have redness spreading from the affected area. °· You have continued or increased fluid, blood, or pus coming from the affected area. °· Your finger or knuckle becomes swollen or is difficult to move. °  °  This information is not intended to replace advice given to you by your health care provider. Make sure you discuss any questions you have with your health care provider. °  °Document Released: 06/15/2000 Document Revised: 05/06/2014 Document Reviewed: 11/27/2013 °Elsevier Interactive Patient Education ©2016 [employer] ° °

## 2015-11-06 DIAGNOSIS — N200 Calculus of kidney: Secondary | ICD-10-CM | POA: Insufficient documentation

## 2016-01-30 ENCOUNTER — Encounter: Payer: Self-pay | Admitting: Emergency Medicine

## 2016-01-30 ENCOUNTER — Emergency Department: Payer: Self-pay

## 2016-01-30 ENCOUNTER — Emergency Department
Admission: EM | Admit: 2016-01-30 | Discharge: 2016-01-30 | Disposition: A | Discharge status: Home / Self Care | Payer: Self-pay | Attending: Emergency Medicine | Admitting: Emergency Medicine

## 2016-01-30 DIAGNOSIS — E119 Type 2 diabetes mellitus without complications: Secondary | ICD-10-CM | POA: Insufficient documentation

## 2016-01-30 DIAGNOSIS — I10 Essential (primary) hypertension: Secondary | ICD-10-CM | POA: Insufficient documentation

## 2016-01-30 DIAGNOSIS — R609 Edema, unspecified: Secondary | ICD-10-CM | POA: Insufficient documentation

## 2016-01-30 DIAGNOSIS — R0989 Other specified symptoms and signs involving the circulatory and respiratory systems: Secondary | ICD-10-CM | POA: Insufficient documentation

## 2016-01-30 DIAGNOSIS — Z79899 Other long term (current) drug therapy: Secondary | ICD-10-CM | POA: Insufficient documentation

## 2016-01-30 HISTORY — DX: Dissection of ascending aorta: I71.010

## 2016-01-30 HISTORY — DX: Dissection of thoracic aorta: I71.01

## 2016-01-30 LAB — BASIC METABOLIC PANEL
Anion gap: 6 (ref 5–15)
BUN: 17 mg/dL (ref 6–20)
CALCIUM: 9.2 mg/dL (ref 8.9–10.3)
CO2: 26 mmol/L (ref 22–32)
Chloride: 102 mmol/L (ref 101–111)
Creatinine, Ser: 1.14 mg/dL (ref 0.61–1.24)
GFR calc Af Amer: >60 mL/min (ref >60)
GLUCOSE: 236 mg/dL — AB (ref 65–99)
Potassium: 4.2 mmol/L (ref 3.5–5.1)
Sodium: 134 mmol/L — ABNORMAL LOW (ref 135–145)

## 2016-01-30 LAB — CBC
HCT: 39.9 % — ABNORMAL LOW (ref 40.0–52.0)
Hemoglobin: 12.7 g/dL — ABNORMAL LOW (ref 13.0–18.0)
MCH: 25.5 pg — ABNORMAL LOW (ref 26.0–34.0)
MCHC: 31.8 g/dL — AB (ref 32.0–36.0)
MCV: 80.2 fL (ref 80.0–100.0)
Platelets: 249 10*3/uL (ref 150–440)
RBC: 4.98 MIL/uL (ref 4.40–5.90)
RDW: 15.5 % — ABNORMAL HIGH (ref 11.5–14.5)
WBC: 7.1 10*3/uL (ref 3.8–10.6)

## 2016-01-30 LAB — TROPONIN I

## 2016-01-30 MED ORDER — GUAIFENESIN 200 MG PO TABS: 200.0000 mg | ORAL_TABLET | ORAL | 0 refills | Status: DC | PRN

## 2016-01-30 MED ORDER — GUAIFENESIN-CODEINE 100-10 MG/5ML PO SOLN: 5.0000 mL | Freq: Four times a day (QID) | ORAL | 0 refills | Status: DC | PRN

## 2016-01-30 NOTE — ED Triage Notes (Signed)
Pt arrived with reports of chest tightness and cough. Recently been taking care of family member that had positive flu diagnosis.  Pt c/o congestion and drainage.  Pt has hx of AAA. Pt denies any shortness of breath.

## 2016-01-30 NOTE — ED Notes (Signed)

## 2016-01-30 NOTE — ED Provider Notes (Signed)
[facility] Emergency Department Provider Note  Time seen: 10:49 AM  I have reviewed the triage vital signs and the nursing notes.   HISTORY  Chief Complaint Chest Pain    HPI Shane Bean is a 61 y.o. 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 Patient states a feeling of congestion in the chest. Denies any fever nausea, vomiting or body aches. Denies any shortness of breath. Patient does state mild lower extremity edema but denies any increased edema. Patient states the chest is not hurting but it feels like there is congestion in the chest that will come out. He states since having the aortic dissection 2 years ago he is very cautious when it comes to any chest discomfort.  Past Medical History:  Diagnosis Date  . Ascending aortic dissection ([place])   . Diabetes mellitus without complication ([place])   . Hypertension   . Kidney stones     There are no active problems to display for this patient.   Past Surgical History:  Procedure Laterality Date  . CARDIAC SURGERY    . REPAIR THORACIC AORTA      Prior to Admission medications   Medication Sig Start Date End Date Taking? Authorizing Provider  mupirocin ointment (BACTROBAN) 2 % Apply to affected area 3 times daily 06/28/15 06/27/16  Arlyss Repress, PA-C  oxyCODONE-acetaminophen (ROXICET) 5-325 MG tablet Take 1-2 tablets by mouth every 4 (four) hours as needed for severe pain. 06/28/15   Arlyss Repress, PA-C    Allergies  Allergen Reactions  . Lisinopril Swelling    History reviewed. No pertinent family history.  Social History Social History  Substance Use Topics  . Smoking status: Never Smoker  . Smokeless tobacco: Never Used  . Alcohol use Yes     Review of Systems Constitutional: Negative for fever. Cardiovascular: Negative for chest pain. Positive for chest congestion. Respiratory: Negative for shortness of breath. Gastrointestinal: Negative for abdominal pain Neurological: Negative for headache 10-point ROS otherwise negative.  ____________________________________________   PHYSICAL EXAM:  VITAL SIGNS: ED Triage Vitals  Enc Vitals Group     BP 01/30/16 1011 (!) 148/83     Pulse Rate 01/30/16 1011 69     Resp 01/30/16 1011 18     Temp 01/30/16 1011 98.1 F (36.7 C)     Temp Source 01/30/16 1011 Oral     SpO2 01/30/16 1011 97 %     Weight 01/30/16 1010 290 lb (131.5 kg)     Height 01/30/16 1010 6' (1.829 m)     Head Circumference --      Peak Flow --      Pain Score 01/30/16 1011 4     Pain Loc --      Pain Edu? --      Excl. in [place]? --     Constitutional: Alert and oriented. Well appearing and in no distress. Eyes: Normal exam ENT   Head: Normocephalic and atraumatic.   Mouth/Throat: Mucous membranes are moist. Cardiovascular: Normal rate, regular rhythm. No murmur Respiratory: Normal respiratory effort without tachypnea nor retractions. Breath sounds are clear  Gastrointestinal: Soft and nontender. No distention.  Musculoskeletal: Nontender with normal range of motion in all extremities. Mild lower extremity edema which the patient states  is baseline. Neurologic:  Normal speech and language. No gross focal neurologic deficits  Skin:  Skin is warm, dry and intact.  Psychiatric: Mood and affect are normal.   ____________________________________________    EKG  EKG reviewed and interpreted by myself shows normal sinus rhythm at 70 bpm. Narrow QRS with normal axis, normal intervals. The patient does have nonspecific ST changes with T-wave inversions in the lateral leads. No ST elevation noted. No EKGs for comparison.  ____________________________________________    RADIOLOGY  Chest x-ray  shows cardiomegaly without any acute process.  ____________________________________________   INITIAL IMPRESSION / ASSESSMENT AND PLAN / ED COURSE  Pertinent labs & imaging results that were available during my care of the patient were reviewed by me and considered in my medical decision making (see chart for details).  Patient presents the emergency department with chest congestion. Denies any pain, shortness of breath or increased large from edema. Patient does state cough for the past 2 days with occasional white sputum production. Denies any fever bodyaches nausea or vomiting. We will check labs including cardiac enzymes and BNP, chest x-ray and closely monitor. Patient's EKG although abnormal does not appear to show any ST elevation. We have no EKG for comparison.  Patient's labs are within normal limits. Troponin is negative. Chest x-rays negative. Patient states he feels he just feels like he has congestion in his chest that he needs to cough out. We will place the patient on guaifenesin and a cough medication at night as he states he was up last night coughing. Denies any fever, nasal congestion or chest pain.  ____________________________________________   FINAL CLINICAL IMPRESSION(S) / ED DIAGNOSES  Cough    Harvest Dark, MD 01/30/16 [id number]

## 2016-01-30 NOTE — Discharge Instructions (Signed)
Please use her medications as needed, as prescribed. Please follow-up with your primary care provider in the next 2-3 days for recheck/reevaluation. Return to the emergency department for any chest pain, trouble breathing, or any other symptom personally concerning to yourself.

## 2016-01-30 NOTE — ED Notes (Signed)
Patient transported to X-ray 

## 2017-01-09 ENCOUNTER — Emergency Department
Admission: EM | Admit: 2017-01-09 | Discharge: 2017-01-09 | Disposition: A | Discharge status: Home / Self Care | Payer: Medicare Other | Attending: Emergency Medicine | Admitting: Emergency Medicine

## 2017-01-09 ENCOUNTER — Emergency Department: Payer: Medicare Other

## 2017-01-09 ENCOUNTER — Other Ambulatory Visit: Payer: Self-pay

## 2017-01-09 ENCOUNTER — Encounter: Payer: Self-pay | Admitting: Emergency Medicine

## 2017-01-09 DIAGNOSIS — E119 Type 2 diabetes mellitus without complications: Secondary | ICD-10-CM | POA: Insufficient documentation

## 2017-01-09 DIAGNOSIS — R52 Pain, unspecified: Secondary | ICD-10-CM

## 2017-01-09 DIAGNOSIS — I10 Essential (primary) hypertension: Secondary | ICD-10-CM | POA: Diagnosis not present

## 2017-01-09 DIAGNOSIS — R160 Hepatomegaly, not elsewhere classified: Secondary | ICD-10-CM | POA: Diagnosis not present

## 2017-01-09 DIAGNOSIS — R1013 Epigastric pain: Secondary | ICD-10-CM | POA: Diagnosis not present

## 2017-01-09 LAB — CBC
HCT: 38.8 % — ABNORMAL LOW (ref 40.0–52.0)
HEMOGLOBIN: 12.6 g/dL — AB (ref 13.0–18.0)
MCH: 25.2 pg — AB (ref 26.0–34.0)
MCHC: 32.6 g/dL (ref 32.0–36.0)
MCV: 77.3 fL — AB (ref 80.0–100.0)
Platelets: 161 10*3/uL (ref 150–440)
RBC: 5.02 MIL/uL (ref 4.40–5.90)
RDW: 16.4 % — ABNORMAL HIGH (ref 11.5–14.5)
WBC: 4.8 10*3/uL (ref 3.8–10.6)

## 2017-01-09 LAB — TROPONIN I: Troponin I: <0.03 ng/mL (ref <0.03)

## 2017-01-09 LAB — COMPREHENSIVE METABOLIC PANEL
ALBUMIN: 3.7 g/dL (ref 3.5–5.0)
ALT: 17 U/L (ref 17–63)
ANION GAP: 10 (ref 5–15)
AST: 31 U/L (ref 15–41)
Alkaline Phosphatase: 48 U/L (ref 38–126)
BUN: 13 mg/dL (ref 6–20)
CHLORIDE: 99 mmol/L — AB (ref 101–111)
CO2: 23 mmol/L (ref 22–32)
Calcium: 9.1 mg/dL (ref 8.9–10.3)
Creatinine, Ser: 1.01 mg/dL (ref 0.61–1.24)
GFR calc non Af Amer: >60 mL/min (ref >60)
GLUCOSE: 236 mg/dL — AB (ref 65–99)
Potassium: 3.6 mmol/L (ref 3.5–5.1)
SODIUM: 132 mmol/L — AB (ref 135–145)
Total Bilirubin: 0.8 mg/dL (ref 0.3–1.2)
Total Protein: 7.5 g/dL (ref 6.5–8.1)

## 2017-01-09 LAB — LIPASE, BLOOD: LIPASE: 54 U/L — AB (ref 11–51)

## 2017-01-09 MED ORDER — OMEPRAZOLE 40 MG PO CPDR: 40.0000 mg | DELAYED_RELEASE_CAPSULE | Freq: Every day | ORAL | 0 refills | Status: DC

## 2017-01-09 MED ORDER — ONDANSETRON 4 MG PO TBDP
4.0000 mg | ORAL_TABLET | Freq: Once | ORAL | Status: AC
  Administered 2017-01-09: 4 mg via ORAL

## 2017-01-09 MED ORDER — DICYCLOMINE HCL 10 MG PO CAPS
20.0000 mg | ORAL_CAPSULE | Freq: Once | ORAL | Status: AC
  Administered 2017-01-09: 20 mg via ORAL

## 2017-01-09 MED ORDER — ONDANSETRON 4 MG PO TBDP
ORAL_TABLET | ORAL | Status: AC
  Administered 2017-01-09: 4 mg via ORAL
  Filled 2017-01-09: qty 1

## 2017-01-09 MED ORDER — DICYCLOMINE HCL 10 MG PO CAPS
ORAL_CAPSULE | ORAL | Status: AC
  Administered 2017-01-09: 20 mg via ORAL
  Filled 2017-01-09: qty 2

## 2017-01-09 NOTE — ED Notes (Signed)
ED Provider at bedside. 

## 2017-01-09 NOTE — ED Provider Notes (Signed)
[facility] Emergency Department Provider Note  ____________________________________________   First MD Initiated Contact with Patient 01/09/17 1004     (approximate)  I have reviewed the triage vital signs and the nursing notes.   HISTORY  Chief Complaint Abdominal Pain    HPI Shane Bean is a 62 y.o. male who self presents the emergency department with 1 day worsening epigastric discomfort.  He has had roughly 2 weeks of symptoms.  His pain is postprandial associated with nausea but no vomiting.  It is epigastric burning aching moderate severity.  It seems to be somewhat worse when lying down and somewhat improved when sitting up.  Yesterday he ate fried chicken from [place] [employer].  His symptoms began insidiously at that point sleep.  He has had history of inguinal hernia repair but no other abdominal surgeries.  He has had normal bowel movements and flatus.  He did attempt to take ranitidine and Maalox with only minimal relief.  He denies chest pain or shortness of breath.  Past Medical History:  Diagnosis Date  . Ascending aortic dissection ([place])   . Diabetes mellitus without complication ([place])   . Hypertension   . Kidney stones     There are no active problems to display for this patient.   Past Surgical History:  Procedure Laterality Date  . CARDIAC SURGERY    . REPAIR THORACIC AORTA      Prior to Admission medications   Medication Sig Start Date End Date Taking? Authorizing Provider  guaiFENesin 200 MG tablet Take 1 tablet (200 mg total) by mouth every 4 (four) hours as needed for cough or to loosen phlegm. 01/30/16   Harvest Dark, MD  guaiFENesin-codeine 100-10 MG/5ML syrup Take 5 mLs by mouth every 6 (six) hours as needed for cough. 01/30/16   Harvest Dark, MD  omeprazole (PRILOSEC) 40 MG capsule Take 1 capsule (40 mg total) by mouth daily. 01/09/17 01/09/18  Darel Hong, MD  oxyCODONE-acetaminophen (ROXICET)  5-325 MG tablet Take 1-2 tablets by mouth every 4 (four) hours as needed for severe pain. 06/28/15   Beers, Pierce Crane, PA-C    Allergies Lisinopril  No family history on file.  Social History Social History   Tobacco Use  . Smoking status: Never Smoker  . Smokeless tobacco: Never Used  Substance Use Topics  . Alcohol use: Yes  . Drug use: Not on file    Review of Systems Constitutional: No fever/chills Eyes: No visual changes. ENT: No sore throat. Cardiovascular: Denies chest pain. Respiratory: Denies shortness of breath. Gastrointestinal: Positive for abdominal pain.  Positive for nausea, no vomiting.  No diarrhea.  No constipation. Genitourinary: Negative for dysuria. Musculoskeletal: Negative for back pain. Skin: Negative for rash. Neurological: Negative for headaches, focal weakness or numbness.   ____________________________________________   PHYSICAL EXAM:  VITAL SIGNS: ED Triage Vitals  Enc Vitals Group     BP 01/09/17 0808 (!) 158/91     Pulse Rate 01/09/17 0808 80     Resp 01/09/17 0808 18     Temp 01/09/17 0808 98.3 F (36.8 C)     Temp Source 01/09/17 0808 Oral     SpO2 01/09/17 0808 95 %     Weight 01/09/17 0810 295 lb (133.8 kg)     Height 01/09/17 0810 5\' 10"  (1.778 m)     Head Circumference --      Peak Flow --      Pain Score 01/09/17 0814 7  Pain Loc --      Pain Edu? --      Excl. in [place]? --     Constitutional: Alert and oriented x4 well-appearing nontoxic no diaphoresis speaks in full clear sentences Eyes: PERRL EOMI. Head: Atraumatic. Nose: No congestion/rhinnorhea. Mouth/Throat: No trismus Neck: No stridor.   Cardiovascular: Normal rate, regular rhythm. Grossly normal heart sounds.  Good peripheral circulation. Respiratory: Normal respiratory effort.  No retractions. Lungs CTAB and moving good air Gastrointestinal: Morbidly obese soft mild right upper quadrant tenderness with negative Murphy's no rebound or guarding no peritonitis  umbilical hernia easily reduced with no skin changes Musculoskeletal: No lower extremity edema   Neurologic:  Normal speech and language. No gross focal neurologic deficits are appreciated. Skin:  Skin is warm, dry and intact. No rash noted. Psychiatric: Mood and affect are normal. Speech and behavior are normal.    ____________________________________________   DIFFERENTIAL includes but not limited to  Biliary colic, cholecystitis, cholangitis, small bowel obstruction, gastric reflux, acute coronary syndrome ____________________________________________   LABS (all labs ordered are listed, but only abnormal results are displayed)  Labs Reviewed  LIPASE, BLOOD - Abnormal; Notable for the following components:      Result Value   Lipase 54 (*)    All other components within normal limits  COMPREHENSIVE METABOLIC PANEL - Abnormal; Notable for the following components:   Sodium 132 (*)    Chloride 99 (*)    Glucose, Bld 236 (*)    All other components within normal limits  CBC - Abnormal; Notable for the following components:   Hemoglobin 12.6 (*)    HCT 38.8 (*)    MCV 77.3 (*)    MCH 25.2 (*)    RDW 16.4 (*)    All other components within normal limits  TROPONIN I    Lab work reviewed by me shows slightly elevated lipase.  Also slightly elevated glucose but no signs of DKA. __________________________________________  EKG  ED ECG REPORT I, Darel Hong, the attending physician, personally viewed and interpreted this ECG.  Date: 01/09/2017 EKG Time:  Rate: 77 Rhythm: normal sinus rhythm QRS Axis: normal Intervals: First-degree AV block ST/T Wave abnormalities: T wave inversion in leads I and aVL consistent with old Narrative Interpretation: Normal sinus rhythm poor R wave progression first-degree AV block high lateral T wave inversion no ST depression or elevation abnormal EKG.  T wave inversions high laterally are consistent with previous EKG January 30, 2016  ____________________________________________  RADIOLOGY  Right upper quadrant ultrasound reviewed by me with a liver mass but no biliary pathology ____________________________________________   PROCEDURES  Procedure(s) performed: no  Procedures  Critical Care performed: no  Observation: no ____________________________________________   INITIAL IMPRESSION / ASSESSMENT AND PLAN / ED COURSE  Pertinent labs & imaging results that were available during my care of the patient were reviewed by me and considered in my medical decision making (see chart for details).  The patient arrives with postprandial abdominal acid for the past 2 weeks.  His symptoms are nonexertional and I do not suspect ACS.  Ultrasound right upper quadrant is pending to evaluate for biliary disease.    Fortunately the patient's right upper quadrant ultrasound is negative for acute pathology.  I did disclose the patient a liver mass that was identified and he verbalizes understanding that it needs to be followed up by his primary care physician.  His upper abdominal discomfort is likely gastric in etiology so we will treat him symptomatically and  refer him back to primary care.  Strict return precautions have been given and the patient verbalized understanding agree with plan. ____________________________________________   FINAL CLINICAL IMPRESSION(S) / ED DIAGNOSES  Final diagnoses:  Pain  Epigastric pain  Liver mass      NEW MEDICATIONS STARTED DURING THIS VISIT:  This SmartLink is deprecated. Use AVSMEDLIST instead to display the medication list for a patient.   Note:  This document was prepared using Dragon voice recognition software and may include unintentional dictation errors.     Darel Hong, MD 01/10/17 1119

## 2017-01-09 NOTE — ED Notes (Signed)
Patient transported to Ultrasound 

## 2017-01-09 NOTE — Discharge Instructions (Signed)
Fortunately today your blood work, your EKG, and your ultrasound were reassuring.  Your ultrasound did show a possible mass in your liver that needs to be evaluated by your primary care physician.  Please begin taking your acid reflux medication every day as prescribed and make an appointment to follow-up with the gastroenterologist.  Return to the emergency department for any concerns.  It was a pleasure to take care of you today, and thank you for coming to our emergency department.  If you have any questions or concerns before leaving please ask the nurse to grab me and I'm more than happy to go through your aftercare instructions again.  If you were prescribed any opioid pain medication today such as Norco, Vicodin, Percocet, morphine, hydrocodone, or oxycodone please make sure you do not drive when you are taking this medication as it can alter your ability to drive safely.  If you have any concerns once you are home that you are not improving or are in fact getting worse before you can make it to your follow-up appointment, please do not hesitate to call 911 and come back for further evaluation.  Darel Hong, MD  Results for orders placed or performed during the hospital encounter of 01/09/17  Lipase, blood  Result Value Ref Range   Lipase 54 (H) 11 - 51 U/L  Comprehensive metabolic panel  Result Value Ref Range   Sodium 132 (L) 135 - 145 mmol/L   Potassium 3.6 3.5 - 5.1 mmol/L   Chloride 99 (L) 101 - 111 mmol/L   CO2 23 22 - 32 mmol/L   Glucose, Bld 236 (H) 65 - 99 mg/dL   BUN 13 6 - 20 mg/dL   Creatinine, Ser 1.01 0.61 - 1.24 mg/dL   Calcium 9.1 8.9 - 10.3 mg/dL   Total Protein 7.5 6.5 - 8.1 g/dL   Albumin 3.7 3.5 - 5.0 g/dL   AST 31 15 - 41 U/L   ALT 17 17 - 63 U/L   Alkaline Phosphatase 48 38 - 126 U/L   Total Bilirubin 0.8 0.3 - 1.2 mg/dL   GFR calc non Af Amer >60 >60 mL/min   GFR calc Af Amer >60 >60 mL/min   Anion gap 10 5 - 15  CBC  Result Value Ref Range   WBC  4.8 3.8 - 10.6 K/uL   RBC 5.02 4.40 - 5.90 MIL/uL   Hemoglobin 12.6 (L) 13.0 - 18.0 g/dL   HCT 38.8 (L) 40.0 - 52.0 %   MCV 77.3 (L) 80.0 - 100.0 fL   MCH 25.2 (L) 26.0 - 34.0 pg   MCHC 32.6 32.0 - 36.0 g/dL   RDW 16.4 (H) 11.5 - 14.5 %   Platelets 161 150 - 440 K/uL  Troponin I  Result Value Ref Range   Troponin I <0.03 <0.03 ng/mL   US Abdomen Limited Ruq  Result Date: 01/09/2017 CLINICAL DATA:  Epigastric abdominal pain since last night. EXAM: ULTRASOUND ABDOMEN LIMITED RIGHT UPPER QUADRANT COMPARISON:  09/20/2014 CT abdomen/pelvis. FINDINGS: Gallbladder: No gallstones or wall thickening visualized. No sonographic Murphy sign noted by sonographer. Common bile duct: Diameter: 3 mm Liver: Liver parenchyma is mildly diffusely echogenic with mild posterior acoustic attenuation, most compatible with mild diffuse hepatic steatosis. No definite liver surface irregularity. There is a hypoechoic 1.3 x 1.1 x 1.4 cm focus in the caudate lobe, not definitely seen on the noncontrast 09/20/2014 CT study. No additional focal liver lesion, noting decreased sensitivity in the setting of an echogenic  liver. Portal vein is patent on color Doppler imaging with normal direction of blood flow towards the liver. IMPRESSION: 1. Normal gallbladder with no cholelithiasis. 2. No biliary ductal dilatation. 3. Mild diffuse hepatic steatosis. 4. Hypoechoic 1.4 cm focus in the caudate lobe, not seen on 09/20/2014 noncontrast CT study. New liver mass not excluded. Follow-up MRI abdomen without and with IV contrast recommended in 3-6 months, or earlier as clinically warranted. Electronically Signed   By: Ilona Sorrel M.D.   On: 01/09/2017 11:06

## 2017-01-09 NOTE — ED Triage Notes (Signed)
Epigastric pain since last night.

## 2017-01-09 NOTE — ED Notes (Signed)
Pt alert and oriented X4, active, cooperative, pt in NAD. RR even and unlabored, color WNL.  Pt informed to return if any life threatening symptoms occur.  Discharge and followup instructions reviewed. Pt left with all of belongings.

## 2017-01-12 ENCOUNTER — Other Ambulatory Visit: Payer: Self-pay

## 2017-01-12 ENCOUNTER — Encounter: Payer: Self-pay | Admitting: Gastroenterology

## 2017-01-12 ENCOUNTER — Other Ambulatory Visit
Admission: RE | Admit: 2017-01-12 | Discharge: 2017-01-12 | Disposition: A | Discharge status: Home / Self Care | Payer: Medicare Other | Source: Ambulatory Visit | Attending: Gastroenterology | Admitting: Gastroenterology

## 2017-01-12 ENCOUNTER — Ambulatory Visit (INDEPENDENT_AMBULATORY_CARE_PROVIDER_SITE_OTHER): Payer: Medicare Other | Admitting: Gastroenterology

## 2017-01-12 VITALS — BP 140/86 | HR 68 | Ht 70.0 in | Wt 298.0 lb

## 2017-01-12 DIAGNOSIS — K7031 Alcoholic cirrhosis of liver with ascites: Secondary | ICD-10-CM

## 2017-01-12 DIAGNOSIS — D649 Anemia, unspecified: Secondary | ICD-10-CM

## 2017-01-12 NOTE — Progress Notes (Signed)
Gastroenterology Consultation  Referring Provider:     Janyth Pupa,* Primary Care Physician:  Janyth Pupa, NP Primary Gastroenterologist:  Dr. Allen Norris     Reason for Consultation:     Liver lesion        HPI:   Shane Bean is a 62 y.o. y/o male referred for consultation & management of liver lesion by Dr. Noberto Retort, Waynard Reeds, NP.  This patient was in the emergency room with epigastric pain that he reported to have been present for 2 weeks. The patient underwent a ultrasound that showed him to have a liver lesion. The patient had previous imaging that did not show this liver lesion. The patient had any in [place] fried chicken prior to going to the emergency room which she reports made the pain starts and it was not helped by Maalox or Zantac. The patient reports that he drinks 2-3 beers a day. The patient hasn't had as much alcohol recently and states that his abdominal pain has been gone for 2 days. Does have a history of a valvular repair with open heart surgery. The patient also reports that he has had clots which require him to be on Coumadin. There is no report of any black stools or bloody stools. The patient also had a slightly low hemoglobin with a low MCV.  Past Medical History:  Diagnosis Date  . Ascending aortic dissection ([place])   . Diabetes mellitus without complication ([place])   . Hypertension   . Kidney stones     Past Surgical History:  Procedure Laterality Date  . CARDIAC SURGERY    . REPAIR THORACIC AORTA      Prior to Admission medications   Medication Sig Start Date End Date Taking? Authorizing Provider  guaiFENesin 200 MG tablet Take 1 tablet (200 mg total) by mouth every 4 (four) hours as needed for cough or to loosen phlegm. 01/30/16   Harvest Dark, MD  guaiFENesin-codeine 100-10 MG/5ML syrup Take 5 mLs by mouth every 6 (six) hours as needed for cough. 01/30/16   Harvest Dark, MD  omeprazole (PRILOSEC) 40 MG capsule Take 1  capsule (40 mg total) by mouth daily. 01/09/17 01/09/18  Darel Hong, MD  oxyCODONE-acetaminophen (ROXICET) 5-325 MG tablet Take 1-2 tablets by mouth every 4 (four) hours as needed for severe pain. 06/28/15   Beers, Pierce Crane, PA-C    No family history on file.   Social History   Tobacco Use  . Smoking status: Never Smoker  . Smokeless tobacco: Never Used  Substance Use Topics  . Alcohol use: Yes  . Drug use: Not on file    Allergies as of 01/12/2017 - Review Complete 01/09/2017  Allergen Reaction Noted  . Lisinopril Swelling 09/20/2014    Review of Systems:    All systems reviewed and negative except where noted in HPI.   Physical Exam:  There were no vitals taken for this visit. No LMP for male patient. Psych:  Alert and cooperative. Normal mood and affect. General:   Alert,  Well-developed, well-nourished, pleasant and cooperative in NAD Head:  Normocephalic and atraumatic. Eyes:  Sclera clear, no icterus.   Conjunctiva pink. Ears:  Normal auditory acuity. Nose:  No deformity, discharge, or lesions. Mouth:  No deformity or lesions,oropharynx pink & moist. Neck:  Supple; no masses or thyromegaly. Lungs:  Respirations even and unlabored.  Clear throughout to auscultation.   No wheezes, crackles, or rhonchi. No acute distress. Heart:  Regular rate and rhythm; no  murmurs, clicks, rubs, or gallops. Abdomen:  Normal bowel sounds.  No bruits.  Soft, non-tender and non-distended without masses, hepatosplenomegaly or hernias noted.  No guarding or rebound tenderness.  Negative Carnett sign.   Rectal:  Deferred.  Msk:  Symmetrical without gross deformities.  Good, equal movement & strength bilaterally. Pulses:  Normal pulses noted. Extremities:  No clubbing or edema.  No cyanosis. Neurologic:  Alert and oriented x3;  grossly normal neurologically. Skin:  Intact without significant lesions or rashes.  No jaundice. Lymph Nodes:  No significant cervical adenopathy. Psych:  Alert and  cooperative. Normal mood and affect.  Imaging Studies: US Abdomen Limited Ruq  Result Date: 01/09/2017 CLINICAL DATA:  Epigastric abdominal pain since last night. EXAM: ULTRASOUND ABDOMEN LIMITED RIGHT UPPER QUADRANT COMPARISON:  09/20/2014 CT abdomen/pelvis. FINDINGS: Gallbladder: No gallstones or wall thickening visualized. No sonographic Murphy sign noted by sonographer. Common bile duct: Diameter: 3 mm Liver: Liver parenchyma is mildly diffusely echogenic with mild posterior acoustic attenuation, most compatible with mild diffuse hepatic steatosis. No definite liver surface irregularity. There is a hypoechoic 1.3 x 1.1 x 1.4 cm focus in the caudate lobe, not definitely seen on the noncontrast 09/20/2014 CT study. No additional focal liver lesion, noting decreased sensitivity in the setting of an echogenic liver. Portal vein is patent on color Doppler imaging with normal direction of blood flow towards the liver. IMPRESSION: 1. Normal gallbladder with no cholelithiasis. 2. No biliary ductal dilatation. 3. Mild diffuse hepatic steatosis. 4. Hypoechoic 1.4 cm focus in the caudate lobe, not seen on 09/20/2014 noncontrast CT study. New liver mass not excluded. Follow-up MRI abdomen without and with IV contrast recommended in 3-6 months, or earlier as clinically warranted. Electronically Signed   By: Ilona Sorrel M.D.   On: 01/09/2017 11:06    Assessment and Plan:   Shane Bean is a 62 y.o. y/o male who has epigastric pain with imaging that showed a liver lesion. The patient will be set up for a blood test for alpha-fetoprotein and he will also be set up for an MRI of the liver lesion. The patient has not had a colonoscopy in the past and will be set up for an EGD and colonoscopy because of his anemia and epigastric pain. His cardiologist will be contacted to see if he can come off his Coumadin for the procedure. I have discussed risks & benefits which include, but are not limited to, bleeding,  infection, perforation & drug reaction.  The patient agrees with this plan & written consent will be obtained.     Lucilla Lame, MD. Marval Regal   Note: This dictation was prepared with Dragon dictation along with smaller phrase technology. Any transcriptional errors that result from this process are unintentional.

## 2017-01-13 LAB — AFP TUMOR MARKER: AFP, Serum, Tumor Marker: 4.9 ng/mL (ref 0.0–8.3)

## 2017-01-23 ENCOUNTER — Other Ambulatory Visit: Payer: Self-pay

## 2017-01-25 ENCOUNTER — Telehealth: Payer: Self-pay

## 2017-01-25 ENCOUNTER — Telehealth: Payer: Self-pay | Admitting: Gastroenterology

## 2017-01-25 NOTE — Telephone Encounter (Signed)
Patient needs to r/s his procedure.

## 2017-01-25 NOTE — Telephone Encounter (Signed)
Pt notified of lab results

## 2017-01-25 NOTE — Telephone Encounter (Signed)
-----   Message from Lucilla Lame, MD sent at 01/16/2017 11:51 AM EST ----- The patient know that his tumor marker was negative

## 2017-01-25 NOTE — Telephone Encounter (Signed)
Pt rescheduled his colonoscopy to 02/28/17 at [facility]. Message was given to [facility] End[facility].

## 2017-01-30 ENCOUNTER — Ambulatory Visit
Admission: RE | Admit: 2017-01-30 | Discharge: 2017-01-30 | Disposition: A | Discharge status: Home / Self Care | Payer: Medicare Other | Source: Ambulatory Visit | Attending: Gastroenterology | Admitting: Gastroenterology

## 2017-01-30 DIAGNOSIS — N2889 Other specified disorders of kidney and ureter: Secondary | ICD-10-CM | POA: Insufficient documentation

## 2017-01-30 DIAGNOSIS — K7689 Other specified diseases of liver: Secondary | ICD-10-CM | POA: Diagnosis not present

## 2017-01-30 DIAGNOSIS — K7031 Alcoholic cirrhosis of liver with ascites: Secondary | ICD-10-CM | POA: Diagnosis not present

## 2017-01-30 DIAGNOSIS — K760 Fatty (change of) liver, not elsewhere classified: Secondary | ICD-10-CM | POA: Diagnosis not present

## 2017-01-30 DIAGNOSIS — I700 Atherosclerosis of aorta: Secondary | ICD-10-CM | POA: Diagnosis not present

## 2017-01-30 MED ORDER — GADOBENATE DIMEGLUMINE 529 MG/ML IV SOLN
20.0000 mL | Freq: Once | INTRAVENOUS | Status: AC | PRN
  Administered 2017-01-30: 20 mL via INTRAVENOUS

## 2017-02-07 ENCOUNTER — Telehealth: Payer: Self-pay

## 2017-02-07 NOTE — Telephone Encounter (Signed)
-----   Message from Lucilla Lame, MD sent at 02/06/2017 10:09 AM EST ----- Let the patient know that his Liver lesion looks to be benign but the aorta or on the MRI did not appear normal and he should be set up to see vascular surgery as soon as possible.

## 2017-02-07 NOTE — Telephone Encounter (Signed)
Pt notified of MRI results. Results faxed to his vascular surgeon, Dr. Robynn Pane for review to see if this is a new issue that needs a sooner appt. Pt stated his had his Aorta repair in 2015 and follows Dr. Steva Ready every year.

## 2017-02-13 ENCOUNTER — Telehealth: Payer: Self-pay | Admitting: Gastroenterology

## 2017-02-13 NOTE — Telephone Encounter (Signed)
Left vm for Pt's NP, Reeves Dam to return my call regarding pt's blood thinner.

## 2017-02-13 NOTE — Telephone Encounter (Signed)
Per Tati patients NP LVM wanting to know if patient had a procedure on 1/29 and has questions regarding a blood thinner form. Reeves Dam [phone]

## 2017-02-15 NOTE — Telephone Encounter (Signed)
Spoke with Reeves Dam in regards to pt's upcoming colonoscopy. Advised her pt's procedure had been changed to 02/28/17. She will fax my request back this afternoon.

## 2017-02-16 ENCOUNTER — Other Ambulatory Visit: Payer: Self-pay

## 2017-02-16 ENCOUNTER — Emergency Department
Admission: EM | Admit: 2017-02-16 | Discharge: 2017-02-16 | Disposition: A | Discharge status: Home / Self Care | Payer: Medicare Other | Attending: Emergency Medicine | Admitting: Emergency Medicine

## 2017-02-16 ENCOUNTER — Emergency Department: Payer: Medicare Other

## 2017-02-16 ENCOUNTER — Encounter: Payer: Self-pay | Admitting: Emergency Medicine

## 2017-02-16 DIAGNOSIS — Z7982 Long term (current) use of aspirin: Secondary | ICD-10-CM | POA: Insufficient documentation

## 2017-02-16 DIAGNOSIS — J029 Acute pharyngitis, unspecified: Secondary | ICD-10-CM | POA: Diagnosis not present

## 2017-02-16 DIAGNOSIS — I10 Essential (primary) hypertension: Secondary | ICD-10-CM | POA: Diagnosis not present

## 2017-02-16 DIAGNOSIS — Z7901 Long term (current) use of anticoagulants: Secondary | ICD-10-CM | POA: Diagnosis not present

## 2017-02-16 DIAGNOSIS — R059 Cough, unspecified: Secondary | ICD-10-CM

## 2017-02-16 DIAGNOSIS — R079 Chest pain, unspecified: Secondary | ICD-10-CM | POA: Diagnosis not present

## 2017-02-16 DIAGNOSIS — E119 Type 2 diabetes mellitus without complications: Secondary | ICD-10-CM | POA: Insufficient documentation

## 2017-02-16 DIAGNOSIS — R05 Cough: Secondary | ICD-10-CM | POA: Diagnosis not present

## 2017-02-16 DIAGNOSIS — Z7984 Long term (current) use of oral hypoglycemic drugs: Secondary | ICD-10-CM | POA: Diagnosis not present

## 2017-02-16 LAB — BASIC METABOLIC PANEL
ANION GAP: 8 (ref 5–15)
BUN: 16 mg/dL (ref 6–20)
CHLORIDE: 102 mmol/L (ref 101–111)
CO2: 25 mmol/L (ref 22–32)
Calcium: 9.2 mg/dL (ref 8.9–10.3)
Creatinine, Ser: 1.2 mg/dL (ref 0.61–1.24)
GFR calc Af Amer: >60 mL/min (ref >60)
Glucose, Bld: 232 mg/dL — ABNORMAL HIGH (ref 65–99)
POTASSIUM: 4.2 mmol/L (ref 3.5–5.1)
Sodium: 135 mmol/L (ref 135–145)

## 2017-02-16 LAB — CBC
HEMATOCRIT: 39.5 % — AB (ref 40.0–52.0)
HEMOGLOBIN: 12.2 g/dL — AB (ref 13.0–18.0)
MCH: 23.9 pg — ABNORMAL LOW (ref 26.0–34.0)
MCHC: 30.9 g/dL — ABNORMAL LOW (ref 32.0–36.0)
MCV: 77.3 fL — AB (ref 80.0–100.0)
Platelets: 231 10*3/uL (ref 150–440)
RBC: 5.1 MIL/uL (ref 4.40–5.90)
RDW: 17 % — AB (ref 11.5–14.5)
WBC: 7.1 10*3/uL (ref 3.8–10.6)

## 2017-02-16 LAB — PROTIME-INR
INR: 2.77
PROTHROMBIN TIME: 29 s — AB (ref 11.4–15.2)

## 2017-02-16 LAB — TROPONIN I
Troponin I: <0.03 ng/mL (ref <0.03)
Troponin I: <0.03 ng/mL (ref <0.03)

## 2017-02-16 LAB — BRAIN NATRIURETIC PEPTIDE: B NATRIURETIC PEPTIDE 5: 90 pg/mL (ref 0.0–100.0)

## 2017-02-16 MED ORDER — IOPAMIDOL (ISOVUE-370) INJECTION 76%
100.0000 mL | Freq: Once | INTRAVENOUS | Status: AC | PRN
  Administered 2017-02-16: 100 mL via INTRAVENOUS
  Filled 2017-02-16: qty 100

## 2017-02-16 NOTE — ED Notes (Signed)
Pt in CT.

## 2017-02-16 NOTE — ED Notes (Signed)
Pt returned from CT °

## 2017-02-16 NOTE — ED Provider Notes (Addendum)
[facility] Emergency Department Provider Note  ____________________________________________   I have reviewed the triage vital signs and the nursing notes. Where available I have reviewed prior notes and, if possible and indicated, outside hospital notes.    HISTORY  Chief Complaint Chest Pain and Cough    HPI Shane Bean is a 62 y.o. male a history of aortic repair, and PEs in the past on Coumadin presents today complaining of runny nose, sore throat and cough.  When he coughs it hurts his chest.  The chest is also feeling a little bit tight in the upper parts of his chest.  This is been going on for several days.  Denies any exertional symptoms infected feels better when he walks around and worse when he sits down.  Does not have a history of stents he states. He has most of his care at outside hospital.  Has had several CTAs of his chest in his life, is known to have a type I aortic dissection with pseudoaneurysm.  CT in July 2018.  Had a aortic dissection in 2015.  He thereafter had PEs.  Has been on Coumadin ever since is compliant with his Coumadin.  Has had no bleeding or lightheadedness.  Only a sensation of tightness in his chest especially with coughing.  No leg swelling.  Denies orthopnea.  Really the only positives are productive cough, postnasal drip and rhinorrhea and tightness in conjunction with this. No radiation of the discomfort nothing makes it better except for walking around.  No pleuritic chest pain or shortness of breath   Past Medical History:  Diagnosis Date  . Ascending aortic dissection ([place])   . Diabetes mellitus without complication ([place])   . Hypertension   . Kidney stones     Patient Active Problem List   Diagnosis Date Noted  . Nephrolithiasis 11/06/2015  . Cough with sputum 01/14/2015  . Fatigue 01/14/2015  . Obesity 01/14/2015  . GERD (gastroesophageal reflux disease) 04/24/2014  . Long term current use of  anticoagulant therapy 04/24/2014  . Type 2 diabetes mellitus with complication ([place]) 81/27/5170  . Ascending aortic dissection ([place]) 10/04/2013  . HTN (hypertension) 05/28/2013  . Pulmonary emboli ([place]) 06/26/2012  . Pulmonary edema 06/23/2012  . Respiratory failure with hypoxia ([place]) 06/23/2012  . Osteoarthritis of left hip 04/19/2012  . Right leg DVT ([place]) 06/17/2011    Past Surgical History:  Procedure Laterality Date  . CARDIAC SURGERY    . REPAIR THORACIC AORTA      Prior to Admission medications   Medication Sig Start Date End Date Taking? Authorizing Provider  allopurinol (ZYLOPRIM) 100 MG tablet TAKE ONE TABLET BY MOUTH ONCE DAILY 09/16/16   [provider]  amLODipine (NORVASC) 10 MG tablet TAKE ONE TABLET BY MOUTH ONCE DAILY (FOR HIGH BLOOD PRESSURE) 11/15/16   [provider]  aspirin EC 325 MG tablet Take by mouth. 10/15/13   [provider]  doxazosin (CARDURA) 8 MG tablet TAKE TWO TABLETS BY MOUTH NIGHTLY 11/07/16   [provider]  glipiZIDE (GLUCOTROL) 5 MG tablet TAKE TWO TABLETS BY MOUTH TWICE DAILY BEFORE MEAL(S) 07/15/16   [provider]  guaiFENesin 200 MG tablet Take 1 tablet (200 mg total) by mouth every 4 (four) hours as needed for cough or to loosen phlegm. 01/30/16   Harvest Dark, MD  guaiFENesin-codeine 100-10 MG/5ML syrup Take 5 mLs by mouth every 6 (six) hours as needed for cough. 01/30/16   Harvest Dark, MD  metoprolol  tartrate (LOPRESSOR) 100 MG tablet TAKE ONE TABLET BY MOUTH TWICE DAILY 07/15/16   [provider]  omeprazole (PRILOSEC) 40 MG capsule Take 1 capsule (40 mg total) by mouth daily. 01/09/17 01/09/18  Darel Hong, MD  oxyCODONE-acetaminophen (ROXICET) 5-325 MG tablet Take 1-2 tablets by mouth every 4 (four) hours as needed for severe pain. 06/28/15   Beers, Pierce Crane, PA-C  ranitidine (ZANTAC) 150 MG capsule Take by mouth. 10/15/13   [provider]  simvastatin (ZOCOR) 10 MG  tablet TAKE ONE TABLET BY MOUTH ONCE DAILY 07/15/16   [provider]  spironolactone (ALDACTONE) 25 MG tablet TAKE ONE TABLET BY MOUTH ONCE DAILY 06/03/16   [provider]  warfarin (COUMADIN) 6 MG tablet TAKE ONE TABLET BY MOUTH ONCE DAILY OR  AS  DIRECTED 08/05/16   [provider]  warfarin (COUMADIN) 7.5 MG tablet Take as directed 05/23/14   [provider]    Allergies Chlorthalidone; Hydralazine; and Lisinopril  No family history on file.  Social History Social History   Tobacco Use  . Smoking status: Never Smoker  . Smokeless tobacco: Never Used  Substance Use Topics  . Alcohol use: Yes  . Drug use: No    Review of Systems Constitutional: No fever/chills Eyes: No visual changes. ENT: No sore throat. No stiff neck no neck pain Cardiovascular: Denies chest pain. Respiratory: Denies shortness of breath. Gastrointestinal:   no vomiting.  No diarrhea.  No constipation. Genitourinary: Negative for dysuria. Musculoskeletal: Negative lower extremity swelling Skin: Negative for rash. Neurological: Negative for severe headaches, focal weakness or numbness.   ____________________________________________   PHYSICAL EXAM:  VITAL SIGNS: ED Triage Vitals  Enc Vitals Group     BP 02/16/17 1543 128/80     Pulse Rate 02/16/17 1543 70     Resp 02/16/17 1543 18     Temp 02/16/17 1543 98.3 F (36.8 C)     Temp Source 02/16/17 1543 Oral     SpO2 02/16/17 1543 96 %     Weight 02/16/17 1544 295 lb (133.8 kg)     Height 02/16/17 1544 5\' 10"  (1.778 m)     Head Circumference --      Peak Flow --      Pain Score 02/16/17 1543 7     Pain Loc --      Pain Edu? --      Excl. in [place]? --     Constitutional: Alert and oriented. Well appearing and in no acute distress. Eyes: Conjunctivae are normal Head: Atraumatic HEENT: No congestion/rhinnorhea. Mucous membranes are moist.  Oropharynx non-erythematous Neck:   Nontender with no meningismus, no  masses, no stridor Cardiovascular: Normal rate, regular rhythm. Grossly normal heart sounds.  Good peripheral circulation. Respiratory: Normal respiratory effort.  No retractions. Lungs CTAB. Abdominal: Soft and nontender. No distention. No guarding no rebound Chest: Tender palpation left chest wall I touch this area patient states abscess the pain right there" and pulls back, but this is an area devoid of erythema, or other lesions, no flail chest no crepitus no rib fracture palpated Back:  There is no focal tenderness or step off.  there is no midline tenderness there are no lesions noted. there is no CVA tenderness Musculoskeletal: No lower extremity tenderness, no upper extremity tenderness. No joint effusions, no DVT signs strong distal pulses no edema Neurologic:  Normal speech and language. No gross focal neurologic deficits are appreciated.  Skin:  Skin is warm, dry and intact. No rash  noted. Psychiatric: Mood and affect are normal. Speech and behavior are normal.  ____________________________________________   LABS (all labs ordered are listed, but only abnormal results are displayed)  Labs Reviewed  BASIC METABOLIC PANEL - Abnormal; Notable for the following components:      Result Value   Glucose, Bld 232 (*)    All other components within normal limits  CBC - Abnormal; Notable for the following components:   Hemoglobin 12.2 (*)    HCT 39.5 (*)    MCV 77.3 (*)    MCH 23.9 (*)    MCHC 30.9 (*)    RDW 17.0 (*)    All other components within normal limits  TROPONIN I  TROPONIN I  PROTIME-INR  BRAIN NATRIURETIC PEPTIDE    Pertinent labs  results that were available during my care of the patient were reviewed by me and considered in my medical decision making (see chart for details). ____________________________________________  EKG  I personally interpreted any EKGs ordered by me or triage Sinus rhythm rate 74 bpm no acute ST elevation or depression normal axis,  nonspecific ST changes no acute ischemia ____________________________________________  RADIOLOGY  Pertinent labs & imaging results that were available during my care of the patient were reviewed by me and considered in my medical decision making (see chart for details). If possible, patient and/or family made aware of any abnormal findings.  Dg Chest 2 View  Result Date: 02/16/2017 CLINICAL DATA:  Chest pain.  Hypertension. EXAM: CHEST  2 VIEW COMPARISON:  January 30, 2016 FINDINGS: There is no edema or consolidation. Heart is upper normal in size with pulmonary vascularity within normal limits. No adenopathy. Aorta is mildly prominent, stable. Patient is status post median sternotomy. No adenopathy. There is degenerative change in the thoracic spine. IMPRESSION: No edema or consolidation. Stable cardiac silhouette. Aortic prominence is likely indicative of chronic hypertension. Electronically Signed   By: Lowella Grip III M.D.   On: 02/16/2017 16:06   ____________________________________________    PROCEDURES  Procedure(s) performed: None  Procedures  Critical Care performed: None  ____________________________________________   INITIAL IMPRESSION / ASSESSMENT AND PLAN / ED COURSE  Pertinent labs & imaging results that were available during my care of the patient were reviewed by me and considered in my medical decision making (see chart for details).  Patient with a history of PEs after and probably as a complication of aortic dissection and repair presents today complaining of runny nose, mild sore throat, productive cough and chest tightness.  Most likely, this is exactly what appears to be, is reproducible chest wall pain/discomfort in the context of a viral URI.  However, given that the patient has significant past history of as noted above, will obtain CT scan to rule out new PE, we will also send repeat troponin and BNP.  Patient in no acute  distress,  ----------------------------------------- 7:46 PM on 02/16/2017 -----------------------------------------  Patient in no acute distress vital signs are reassuring blood work is reassuring CT scan is reassuring, here with runny nose and cough, I do not see any indication for antibiotics and antibiotics only afffect his Coumadin anyway.  At this time, there does not appear to be clinical evidence to support the diagnosis of pulmonary embolus, dissection, myocarditis, endocarditis, pericarditis, pericardial tamponade, acute coronary syndrome, pneumothorax, pneumonia, or any other acute intrathoracic pathology that will require admission or acute intervention. Nor is there evidence of any significant intra-abdominal pathology causing this discomfort.   ____________________________________________   FINAL CLINICAL IMPRESSION(S) /  ED DIAGNOSES  Final diagnoses:  None      This chart was dictated using voice recognition software.  Despite best efforts to proofread,  errors can occur which can change meaning.      Schuyler Amor, MD 02/16/17 0932    Schuyler Amor, MD 02/16/17 1946

## 2017-02-16 NOTE — ED Triage Notes (Signed)
Pt states mid to upper sternal cp that has been off and on for a few days now. Pt states it has been when he is resting and active. Pt states s/p open heart surgery with aorta repair in 2015, had no chest pain prior to that surgery.  States he has been coughing with thick green sputum, occasional chills but no thermometer at home.  Occasional shob, none at this time.

## 2017-02-23 ENCOUNTER — Telehealth: Payer: Self-pay

## 2017-02-23 ENCOUNTER — Telehealth: Payer: Self-pay | Admitting: Gastroenterology

## 2017-02-23 NOTE — Telephone Encounter (Signed)
Received call from Mr. Shane Bean with concerns that he cannot afford his prep for colonoscopy. Mr. Shane Bean was redirected to [facility]. He states he just pulled in their parking lot to get further information about his procedure. Oncology Nurse Navigator Documentation  Navigator Location: [facility] (02/23/17 1400)   )                                                     Time Spent with Patient: 15 (02/23/17 1400)

## 2017-02-23 NOTE — Telephone Encounter (Signed)
Patient confused about procedure, please call patient.

## 2017-02-24 ENCOUNTER — Other Ambulatory Visit: Payer: Self-pay

## 2017-02-24 MED ORDER — NA SULFATE-K SULFATE-MG SULF 17.5-3.13-1.6 GM/177ML PO SOLN: 1.0000 | Freq: Once | ORAL | 0 refills | Status: AC

## 2017-02-27 ENCOUNTER — Telehealth: Payer: Self-pay | Admitting: Gastroenterology

## 2017-02-27 ENCOUNTER — Other Ambulatory Visit: Payer: Self-pay

## 2017-02-27 MED ORDER — PEG 3350-KCL-NABCB-NACL-NASULF 236 G PO SOLR: ORAL | 0 refills | Status: DC

## 2017-02-27 NOTE — Telephone Encounter (Signed)
pt states nurse called in cheaper bowel prep but when he went to pharmacy it was the same rx called in as before please call pt

## 2017-03-01 NOTE — Telephone Encounter (Signed)
Golytely rx has been sent to pt's pharmacy per his request.

## 2017-03-07 ENCOUNTER — Telehealth: Payer: Self-pay | Admitting: Gastroenterology

## 2017-03-07 NOTE — Telephone Encounter (Signed)
Spoke with Judson Roch from [facility] and advise her we prefer his PCP or cardiologist to make the decision on whether pt will need a Lovonox bridge for when he needs to stop his warfarin for the colonoscopy. Judson Roch will contact Reeves Dam, NP in regards to this.

## 2017-03-07 NOTE — Telephone Encounter (Signed)
Shane Bean with [facility]. Clarify Warfarin and bridge with Lovanox? [place]. Please call Sarah Warehouse manager)

## 2017-03-10 ENCOUNTER — Telehealth: Payer: Self-pay | Admitting: Gastroenterology

## 2017-03-10 NOTE — Telephone Encounter (Signed)
Patient needs to reschedule his colonoscopy. Please call.

## 2017-03-10 NOTE — Telephone Encounter (Signed)
Pt would like to reschedule colonoscopy to April 2nd.

## 2017-04-03 ENCOUNTER — Encounter: Payer: Self-pay | Admitting: *Deleted

## 2017-04-04 ENCOUNTER — Encounter
Admission: RE | Disposition: A | Discharge status: Home / Self Care | Payer: Self-pay | Source: Ambulatory Visit | Attending: Gastroenterology

## 2017-04-04 ENCOUNTER — Ambulatory Visit: Payer: Medicare Other | Admitting: Anesthesiology

## 2017-04-04 ENCOUNTER — Ambulatory Visit
Admission: RE | Admit: 2017-04-04 | Discharge: 2017-04-04 | Disposition: A | Discharge status: Home / Self Care | Payer: Medicare Other | Source: Ambulatory Visit | Attending: Gastroenterology | Admitting: Gastroenterology

## 2017-04-04 DIAGNOSIS — K2950 Unspecified chronic gastritis without bleeding: Secondary | ICD-10-CM | POA: Diagnosis not present

## 2017-04-04 DIAGNOSIS — Z87442 Personal history of urinary calculi: Secondary | ICD-10-CM | POA: Insufficient documentation

## 2017-04-04 DIAGNOSIS — D509 Iron deficiency anemia, unspecified: Secondary | ICD-10-CM | POA: Diagnosis not present

## 2017-04-04 DIAGNOSIS — Z888 Allergy status to other drugs, medicaments and biological substances status: Secondary | ICD-10-CM | POA: Diagnosis not present

## 2017-04-04 DIAGNOSIS — D125 Benign neoplasm of sigmoid colon: Secondary | ICD-10-CM

## 2017-04-04 DIAGNOSIS — I739 Peripheral vascular disease, unspecified: Secondary | ICD-10-CM | POA: Insufficient documentation

## 2017-04-04 DIAGNOSIS — K5790 Diverticulosis of intestine, part unspecified, without perforation or abscess without bleeding: Secondary | ICD-10-CM | POA: Diagnosis not present

## 2017-04-04 DIAGNOSIS — K2970 Gastritis, unspecified, without bleeding: Secondary | ICD-10-CM | POA: Diagnosis not present

## 2017-04-04 DIAGNOSIS — Z79899 Other long term (current) drug therapy: Secondary | ICD-10-CM | POA: Insufficient documentation

## 2017-04-04 DIAGNOSIS — Z794 Long term (current) use of insulin: Secondary | ICD-10-CM | POA: Insufficient documentation

## 2017-04-04 DIAGNOSIS — Z7982 Long term (current) use of aspirin: Secondary | ICD-10-CM | POA: Insufficient documentation

## 2017-04-04 DIAGNOSIS — E119 Type 2 diabetes mellitus without complications: Secondary | ICD-10-CM | POA: Diagnosis not present

## 2017-04-04 DIAGNOSIS — D124 Benign neoplasm of descending colon: Secondary | ICD-10-CM

## 2017-04-04 DIAGNOSIS — R1013 Epigastric pain: Secondary | ICD-10-CM

## 2017-04-04 DIAGNOSIS — I10 Essential (primary) hypertension: Secondary | ICD-10-CM | POA: Insufficient documentation

## 2017-04-04 DIAGNOSIS — Z951 Presence of aortocoronary bypass graft: Secondary | ICD-10-CM | POA: Diagnosis not present

## 2017-04-04 DIAGNOSIS — Z8679 Personal history of other diseases of the circulatory system: Secondary | ICD-10-CM | POA: Diagnosis not present

## 2017-04-04 DIAGNOSIS — Z7901 Long term (current) use of anticoagulants: Secondary | ICD-10-CM | POA: Insufficient documentation

## 2017-04-04 DIAGNOSIS — D120 Benign neoplasm of cecum: Secondary | ICD-10-CM | POA: Insufficient documentation

## 2017-04-04 DIAGNOSIS — K635 Polyp of colon: Secondary | ICD-10-CM

## 2017-04-04 DIAGNOSIS — D490 Neoplasm of unspecified behavior of digestive system: Secondary | ICD-10-CM | POA: Diagnosis not present

## 2017-04-04 DIAGNOSIS — B9681 Helicobacter pylori [H. pylori] as the cause of diseases classified elsewhere: Secondary | ICD-10-CM | POA: Diagnosis not present

## 2017-04-04 DIAGNOSIS — D649 Anemia, unspecified: Secondary | ICD-10-CM

## 2017-04-04 HISTORY — PX: ESOPHAGOGASTRODUODENOSCOPY (EGD) WITH PROPOFOL: SHX5813

## 2017-04-04 HISTORY — PX: COLONOSCOPY WITH PROPOFOL: SHX5780

## 2017-04-04 LAB — GLUCOSE, CAPILLARY: Glucose-Capillary: 157 mg/dL — ABNORMAL HIGH (ref 65–99)

## 2017-04-04 SURGERY — COLONOSCOPY WITH PROPOFOL
Anesthesia: General

## 2017-04-04 MED ORDER — SODIUM CHLORIDE 0.9 % IV SOLN
INTRAVENOUS | Status: DC
  Administered 2017-04-04: 1000 mL via INTRAVENOUS

## 2017-04-04 MED ORDER — PROPOFOL 10 MG/ML IV BOLUS
INTRAVENOUS | Status: DC | PRN
  Administered 2017-04-04: 60 mg via INTRAVENOUS
  Administered 2017-04-04: 40 mg via INTRAVENOUS
  Administered 2017-04-04: 60 mg via INTRAVENOUS
  Administered 2017-04-04: 40 mg via INTRAVENOUS

## 2017-04-04 MED ORDER — FENTANYL CITRATE (PF) 100 MCG/2ML IJ SOLN
INTRAMUSCULAR | Status: AC
  Filled 2017-04-04: qty 2

## 2017-04-04 MED ORDER — LACTATED RINGERS IV SOLN
INTRAVENOUS | Status: DC | PRN
  Administered 2017-04-04: 10:00:00 via INTRAVENOUS

## 2017-04-04 MED ORDER — GLYCOPYRROLATE 0.2 MG/ML IJ SOLN
INTRAMUSCULAR | Status: DC | PRN
  Administered 2017-04-04: 0.2 mg via INTRAVENOUS

## 2017-04-04 MED ORDER — GLYCOPYRROLATE 0.2 MG/ML IJ SOLN
INTRAMUSCULAR | Status: AC
  Filled 2017-04-04: qty 1

## 2017-04-04 MED ORDER — LIDOCAINE HCL (PF) 2 % IJ SOLN
INTRAMUSCULAR | Status: AC
  Filled 2017-04-04: qty 10

## 2017-04-04 MED ORDER — LIDOCAINE HCL (CARDIAC) 20 MG/ML IV SOLN
INTRAVENOUS | Status: DC | PRN
  Administered 2017-04-04: 60 mg via INTRAVENOUS
  Administered 2017-04-04: 40 mg via INTRAVENOUS

## 2017-04-04 MED ORDER — PROPOFOL 500 MG/50ML IV EMUL
INTRAVENOUS | Status: DC | PRN
  Administered 2017-04-04: 120 ug/kg/min via INTRAVENOUS

## 2017-04-04 MED ORDER — MIDAZOLAM HCL 2 MG/2ML IJ SOLN
INTRAMUSCULAR | Status: AC
  Filled 2017-04-04: qty 2

## 2017-04-04 MED ORDER — PHENYLEPHRINE HCL 10 MG/ML IJ SOLN
INTRAMUSCULAR | Status: DC | PRN
  Administered 2017-04-04: 100 ug via INTRAVENOUS
  Administered 2017-04-04: 150 ug via INTRAVENOUS
  Administered 2017-04-04: 50 ug via INTRAVENOUS

## 2017-04-04 MED ORDER — MIDAZOLAM HCL 2 MG/2ML IJ SOLN
INTRAMUSCULAR | Status: DC | PRN
  Administered 2017-04-04: 2 mg via INTRAVENOUS

## 2017-04-04 MED ORDER — FENTANYL CITRATE (PF) 100 MCG/2ML IJ SOLN
INTRAMUSCULAR | Status: DC | PRN
  Administered 2017-04-04 (×2): 50 ug via INTRAVENOUS

## 2017-04-04 NOTE — Anesthesia Preprocedure Evaluation (Signed)
Anesthesia Evaluation  Patient identified by MRN, date of birth, ID band Patient awake    Reviewed: Allergy & Precautions, H&P , NPO status , Patient's Chart, lab work & pertinent test results, reviewed documented beta blocker date and time   Airway Mallampati: III   Neck ROM: full    Dental  (+) Poor Dentition, Teeth Intact, Chipped   Pulmonary neg pulmonary ROS,    Pulmonary exam normal        Cardiovascular Exercise Tolerance: Poor hypertension, On Medications (-) angina+ CABG and + Peripheral Vascular Disease  negative cardio ROS Normal cardiovascular exam Rhythm:regular Rate:Normal     Neuro/Psych negative neurological ROS  negative psych ROS   GI/Hepatic negative GI ROS, Neg liver ROS, GERD  Medicated,  Endo/Other  negative endocrine ROSdiabetes, Well Controlled, Type 2, Oral Hypoglycemic Agents  Renal/GU Renal diseasenegative Renal ROS  negative genitourinary   Musculoskeletal   Abdominal   Peds  Hematology negative hematology ROS (+)   Anesthesia Other Findings Past Medical History: No date: Ascending aortic dissection ([facility]) No date: Diabetes mellitus without complication ([facility]) No date: Hypertension No date: Kidney stones Past Surgical History: No date: CARDIAC SURGERY No date: REPAIR THORACIC AORTA BMI    Body Mass Index:  41.61 kg/m     Reproductive/Obstetrics negative OB ROS                             Anesthesia Physical Anesthesia Plan  ASA: III  Anesthesia Plan: General   Post-op Pain Management:    Induction:   PONV Risk Score and Plan:   Airway Management Planned:   Additional Equipment:   Intra-op Plan:   Post-operative Plan:   Informed Consent: I have reviewed the patients History and Physical, chart, labs and discussed the procedure including the risks, benefits and alternatives for the proposed anesthesia with the patient or authorized  representative who has indicated his/her understanding and acceptance.   Dental Advisory Given  Plan Discussed with: CRNA  Anesthesia Plan Comments:         Anesthesia Quick Evaluation

## 2017-04-04 NOTE — Anesthesia Post-op Follow-up Note (Signed)
Anesthesia QCDR form completed.        

## 2017-04-04 NOTE — Op Note (Signed)
[facility] Patient Name: Shane Bean Procedure Date: 04/04/2017 9:38 AM MRN: [id number] Account #: [id number] Date of Birth: 28-Dec-1955 Admit Type: Outpatient Age: 62 Room: [facility] ENDO ROOM 4 Gender: Male Note Status: Finalized Procedure:            Upper GI endoscopy Indications:          Epigastric abdominal pain, Iron deficiency anemia Providers:            Lucilla Lame MD, MD Referring MD:         No Local Md, MD (Referring MD) Medicines:            Propofol per Anesthesia Complications:        No immediate complications. Procedure:            Pre-Anesthesia Assessment:                       - Prior to the procedure, a History and Physical was                        performed, and patient medications and allergies were                        reviewed. The patient's tolerance of previous                        anesthesia was also reviewed. The risks and benefits of                        the procedure and the sedation options and risks were                        discussed with the patient. All questions were                        answered, and informed consent was obtained. Prior                        Anticoagulants: The patient has taken no previous                        anticoagulant or antiplatelet agents. ASA Grade                        Assessment: II - A patient with mild systemic disease.                        After reviewing the risks and benefits, the patient was                        deemed in satisfactory condition to undergo the                        procedure.                       After obtaining informed consent, the endoscope was                        passed under direct vision. Throughout the procedure,  the patient's blood pressure, pulse, and oxygen                        saturations were monitored continuously. The Endoscope                        was introduced through the mouth, and advanced  to the                        second part of duodenum. The upper GI endoscopy was                        accomplished without difficulty. The patient tolerated                        the procedure well. Findings:      The examined esophagus was normal.      Localized moderate inflammation was found in the gastric body. Biopsies       were taken with a cold forceps for histology.      The examined duodenum was normal. Impression:           - Normal esophagus.                       - Gastritis. Biopsied.                       - Normal examined duodenum. Recommendation:       - Discharge patient to home.                       - Resume previous diet.                       - Continue present medications. Procedure Code(s):    --- Professional ---                       [phone], Esophagogastroduodenoscopy, flexible, transoral;                        with biopsy, single or multiple Diagnosis Code(s):    --- Professional ---                       D50.9, Iron deficiency anemia, unspecified                       R10.13, Epigastric pain                       K29.70, Gastritis, unspecified, without bleeding CPT copyright 2017 American Medical Association. All rights reserved. The codes documented in this report are preliminary and upon coder review may  be revised to meet current compliance requirements. Lucilla Lame MD, MD 04/04/2017 9:58:59 AM This report has been signed electronically. Number of Addenda: 0 Note Initiated On: 04/04/2017 9:38 AM      [facility]

## 2017-04-04 NOTE — Transfer of Care (Signed)
Immediate Anesthesia Transfer of Care Note  Patient: [name]  Procedure(s) Performed: COLONOSCOPY WITH PROPOFOL (N/A ) ESOPHAGOGASTRODUODENOSCOPY (EGD) WITH PROPOFOL (N/A )  Patient Location: PACU  Anesthesia Type:General  Level of Consciousness: awake, alert  and oriented  Airway & Oxygen Therapy: Patient Spontanous Breathing  Post-op Assessment: Report given to RN and Post -op Vital signs reviewed and stable  Post vital signs: Reviewed and stable  Last Vitals:  Vitals Value Taken Time  BP 99/53 04/04/2017 10:26 AM  Temp 36.3 C 04/04/2017 10:26 AM  Pulse 81 04/04/2017 10:27 AM  Resp 20 04/04/2017 10:27 AM  SpO2 93 % 04/04/2017 10:27 AM  Vitals shown include unvalidated device data.  Last Pain:  Vitals:   04/04/17 1026  TempSrc: Tympanic  PainSc: 0-No pain         Complications: No apparent anesthesia complications

## 2017-04-04 NOTE — H&P (Signed)
Lucilla Lame, MD [facility] [address], [place], [place] [address] Phone:[phone] Fax : [phone]  Primary Care Physician:  Janyth Pupa, NP Primary Gastroenterologist:  Dr. Allen Norris  Pre-Procedure History & Physical: HPI:  Shane Bean is a 62 y.o. male is here for an endoscopy and colonoscopy.   Past Medical History:  Diagnosis Date  . Ascending aortic dissection ([place])   . Diabetes mellitus without complication ([place])   . Hypertension   . Kidney stones     Past Surgical History:  Procedure Laterality Date  . CARDIAC SURGERY    . REPAIR THORACIC AORTA      Prior to Admission medications   Medication Sig Start Date End Date Taking? Authorizing Provider  allopurinol (ZYLOPRIM) 100 MG tablet TAKE ONE TABLET BY MOUTH ONCE DAILY 09/16/16  Yes [provider]  amLODipine (NORVASC) 10 MG tablet TAKE ONE TABLET BY MOUTH ONCE DAILY (FOR HIGH BLOOD PRESSURE) 11/15/16  Yes [provider]  aspirin EC 325 MG tablet Take by mouth. 10/15/13  Yes [provider]  doxazosin (CARDURA) 8 MG tablet TAKE TWO TABLETS BY MOUTH NIGHTLY 11/07/16  Yes [provider]  glipiZIDE (GLUCOTROL) 5 MG tablet TAKE TWO TABLETS BY MOUTH TWICE DAILY BEFORE MEAL(S) 07/15/16  Yes [provider]  guaiFENesin 200 MG tablet Take 1 tablet (200 mg total) by mouth every 4 (four) hours as needed for cough or to loosen phlegm. 01/30/16  Yes Harvest Dark, MD  guaiFENesin-codeine 100-10 MG/5ML syrup Take 5 mLs by mouth every 6 (six) hours as needed for cough. 01/30/16  Yes Paduchowski, Lennette Bihari, MD  metoprolol tartrate (LOPRESSOR) 100 MG tablet TAKE ONE TABLET BY MOUTH TWICE DAILY 07/15/16  Yes [provider]  omeprazole (PRILOSEC) 40 MG capsule Take 1 capsule (40 mg total) by mouth daily. 01/09/17 01/09/18 Yes Darel Hong, MD  oxyCODONE-acetaminophen (ROXICET) 5-325 MG tablet Take 1-2 tablets by mouth every 4 (four) hours as needed for severe pain.  06/28/15  Yes Beers, Pierce Crane, PA-C  polyethylene glycol (GOLYTELY) 236 g solution Drink one 8 oz glass every 20 mins until stools are clear starting at 5:00pm on 03/13/17 02/27/17  Yes Lucilla Lame, MD  ranitidine (ZANTAC) 150 MG capsule Take by mouth. 10/15/13  Yes [provider]  simvastatin (ZOCOR) 10 MG tablet TAKE ONE TABLET BY MOUTH ONCE DAILY 07/15/16  Yes [provider]  spironolactone (ALDACTONE) 25 MG tablet TAKE ONE TABLET BY MOUTH ONCE DAILY 06/03/16  Yes [provider]  warfarin (COUMADIN) 6 MG tablet TAKE ONE TABLET BY MOUTH ONCE DAILY OR  AS  DIRECTED 08/05/16  Yes [provider]  warfarin (COUMADIN) 7.5 MG tablet Take as directed 05/23/14  Yes [provider]    Allergies as of 01/12/2017 - Review Complete 01/12/2017  Allergen Reaction Noted  . Chlorthalidone Swelling 05/11/2011  . Hydralazine  06/22/2011  . Lisinopril Swelling 05/21/2012    History reviewed. No pertinent family history.  Social History   Socioeconomic History  . Marital status: Single    Spouse name: Not on file  . Number of children: Not on file  . Years of education: Not on file  . Highest education level: Not on file  Occupational History  . Not on file  Social Needs  . Financial resource strain: Not on file  . Food insecurity:    Worry: Not on file    Inability: Not on file  . Transportation needs:    Medical: Not on file  Non-medical: Not on file  Tobacco Use  . Smoking status: Never Smoker  . Smokeless tobacco: Never Used  Substance and Sexual Activity  . Alcohol use: Yes  . Drug use: No  . Sexual activity: Not on file  Lifestyle  . Physical activity:    Days per week: Not on file    Minutes per session: Not on file  . Stress: Not on file  Relationships  . Social connections:    Talks on phone: Not on file    Gets together: Not on file    Attends religious service: Not on file    Active member of club or organization: Not on file      Attends meetings of clubs or organizations: Not on file    Relationship status: Not on file  . Intimate partner violence:    Fear of current or ex partner: Not on file    Emotionally abused: Not on file    Physically abused: Not on file    Forced sexual activity: Not on file  Other Topics Concern  . Not on file  Social History Narrative  . Not on file    Review of Systems: See HPI, otherwise negative ROS  Physical Exam: BP (!) 154/94   Pulse 70   Temp (!) 97.2 F (36.2 C) (Tympanic)   Resp (!) 22   Ht 5\' 10"  (1.778 m)   Wt 290 lb (131.5 kg)   SpO2 97%   BMI 41.61 kg/m  General:   Alert,  pleasant and cooperative in NAD Head:  Normocephalic and atraumatic. Neck:  Supple; no masses or thyromegaly. Lungs:  Clear throughout to auscultation.    Heart:  Regular rate and rhythm. Abdomen:  Soft, nontender and nondistended. Normal bowel sounds, without guarding, and without rebound.   Neurologic:  Alert and  oriented x4;  grossly normal neurologically.  Impression/Plan: Shane Bean is here for an endoscopy and colonoscopy to be performed for IDA  Risks, benefits, limitations, and alternatives regarding  endoscopy and colonoscopy have been reviewed with the patient.  Questions have been answered.  All parties agreeable.   Lucilla Lame, MD  04/04/2017, 9:26 AM

## 2017-04-04 NOTE — Anesthesia Procedure Notes (Signed)
Date/Time: 04/04/2017 9:41 AM Performed by: Molli Barrows, MD Pre-anesthesia Checklist: Timeout performed, Patient being monitored, Suction available, Emergency Drugs available and Patient identified Patient Re-evaluated:Patient Re-evaluated prior to induction Oxygen Delivery Method: Nasal cannula

## 2017-04-04 NOTE — Op Note (Signed)
[facility] Patient Name: Shane Bean Procedure Date: 04/04/2017 9:37 AM MRN: [id number] Account #: [id number] Date of Birth: Dec 11, 1955 Admit Type: Outpatient Age: 62 Room: [facility] ENDO ROOM 4 Gender: Male Note Status: Finalized Procedure:            Colonoscopy Indications:          Iron deficiency anemia Providers:            Lucilla Lame MD, MD Referring MD:         No Local Md, MD (Referring MD) Medicines:            Propofol per Anesthesia Complications:        No immediate complications. Procedure:            Pre-Anesthesia Assessment:                       - Prior to the procedure, a History and Physical was                        performed, and patient medications and allergies were                        reviewed. The patient's tolerance of previous                        anesthesia was also reviewed. The risks and benefits of                        the procedure and the sedation options and risks were                        discussed with the patient. All questions were                        answered, and informed consent was obtained. Prior                        Anticoagulants: The patient has taken no previous                        anticoagulant or antiplatelet agents. ASA Grade                        Assessment: II - A patient with mild systemic disease.                        After reviewing the risks and benefits, the patient was                        deemed in satisfactory condition to undergo the                        procedure.                       After obtaining informed consent, the colonoscope was                        passed under direct vision. Throughout the procedure,  the patient's blood pressure, pulse, and oxygen                        saturations were monitored continuously. The                        Colonoscope was introduced through the anus and                        advanced to the  the cecum, identified by appendiceal                        orifice and ileocecal valve. The colonoscopy was                        performed without difficulty. The patient tolerated the                        procedure well. The quality of the bowel preparation                        was excellent. Findings:      The perianal and digital rectal examinations were normal.      A 3 mm polyp was found in the sigmoid colon. The polyp was sessile. The       polyp was removed with a cold biopsy forceps. Resection and retrieval       were complete.      A 3 mm polyp was found in the descending colon. The polyp was sessile.       The polyp was removed with a cold biopsy forceps. Resection and       retrieval were complete.      A non-obstructing large mass was found in the cecum. The mass was       non-circumferential. No bleeding was present. This was biopsied with a       cold forceps for histology.      Multiple small-mouthed diverticula were found in the entire colon. Impression:           - One 3 mm polyp in the sigmoid colon, removed with a                        cold biopsy forceps. Resected and retrieved.                       - One 3 mm polyp in the descending colon, removed with                        a cold biopsy forceps. Resected and retrieved.                       - Tumor in the cecum. Biopsied.                       - Diverticulosis in the entire examined colon. Recommendation:       - Discharge patient to home.                       - Resume previous diet.                       -  Continue present medications.                       - Await pathology results.                       - Refer to a Psychologist, sport and exercise. Procedure Code(s):    --- Professional ---                       [id number], Colonoscopy, flexible; with biopsy, single or                        multiple Diagnosis Code(s):    --- Professional ---                       D50.9, Iron deficiency anemia, unspecified                        D12.5, Benign neoplasm of sigmoid colon                       D12.4, Benign neoplasm of descending colon                       D49.0, Neoplasm of unspecified behavior of digestive                        system CPT copyright 2017 American Medical Association. All rights reserved. The codes documented in this report are preliminary and upon coder review may  be revised to meet current compliance requirements. Lucilla Lame MD, MD 04/04/2017 10:19:00 AM This report has been signed electronically. Number of Addenda: 0 Note Initiated On: 04/04/2017 9:37 AM Scope Withdrawal Time: 0 hours 9 minutes 19 seconds  Total Procedure Duration: 0 hours 12 minutes 36 seconds       [facility]

## 2017-04-05 NOTE — Anesthesia Postprocedure Evaluation (Signed)
Anesthesia Post Note  Patient: Shane Bean  Procedure(s) Performed: COLONOSCOPY WITH PROPOFOL (N/A ) ESOPHAGOGASTRODUODENOSCOPY (EGD) WITH PROPOFOL (N/A )  Patient location during evaluation: PACU Anesthesia Type: General Level of consciousness: awake and alert Pain management: pain level controlled Vital Signs Assessment: post-procedure vital signs reviewed and stable Respiratory status: spontaneous breathing, nonlabored ventilation, respiratory function stable and patient connected to nasal cannula oxygen Cardiovascular status: blood pressure returned to baseline and stable Postop Assessment: no apparent nausea or vomiting Anesthetic complications: no     Last Vitals:  Vitals:   04/04/17 1039 04/04/17 1049  BP: 114/74 118/78  Pulse:    Resp:    Temp:    SpO2:      Last Pain:  Vitals:   04/04/17 1029  TempSrc:   PainSc: 0-No pain                 Molli Barrows

## 2017-04-06 ENCOUNTER — Telehealth: Payer: Self-pay | Admitting: Gastroenterology

## 2017-04-06 LAB — SURGICAL PATHOLOGY

## 2017-04-06 NOTE — Telephone Encounter (Signed)
Patient LVM for pathology results. 

## 2017-04-06 NOTE — Telephone Encounter (Signed)
Returned pt's call and advised him results were not back yet. Advised him I will contact him as soon as they are available.

## 2017-04-07 ENCOUNTER — Other Ambulatory Visit: Payer: Self-pay

## 2017-04-07 ENCOUNTER — Telehealth: Payer: Self-pay | Admitting: Gastroenterology

## 2017-04-07 ENCOUNTER — Encounter: Payer: Self-pay | Admitting: Gastroenterology

## 2017-04-07 MED ORDER — CLARITHROMYCIN 500 MG PO TABS: 500.0000 mg | ORAL_TABLET | Freq: Two times a day (BID) | ORAL | 0 refills | Status: DC

## 2017-04-07 MED ORDER — AMOXICILLIN 500 MG PO CAPS: 1000.0000 mg | ORAL_CAPSULE | Freq: Two times a day (BID) | ORAL | 0 refills | Status: DC

## 2017-04-07 NOTE — Telephone Encounter (Signed)
Antibiotics was very expensive. It was 70.00

## 2017-04-07 NOTE — Telephone Encounter (Signed)
[facility] calling about medication (Biaxin) interaction simvastatin and Warifarin. Please call Pharmacy [phone]

## 2017-04-07 NOTE — Telephone Encounter (Signed)
Pt notified of procedure results.   Clarithromycin was too expensive. Please recommend different option.

## 2017-04-07 NOTE — Telephone Encounter (Signed)
PPI bid Pepto QID Tetracycline 300mg  QID Flgyl 250mg  qid

## 2017-04-07 NOTE — Telephone Encounter (Signed)
-----   Message from Lucilla Lame, MD sent at 04/07/2017  7:40 AM EDT ----- The patient should have a follow up with surgery but needs to be treated for H. Pylori.

## 2017-04-07 NOTE — Telephone Encounter (Signed)
Spoke with pt and faxed Clarithromycin and Biaxin to the [facility] per pt request. He was approved for pt assistance.

## 2017-04-17 ENCOUNTER — Other Ambulatory Visit: Payer: Self-pay | Admitting: Surgery

## 2017-04-17 DIAGNOSIS — K635 Polyp of colon: Secondary | ICD-10-CM

## 2017-05-01 ENCOUNTER — Telehealth: Payer: Self-pay | Admitting: Gastroenterology

## 2017-05-01 NOTE — Telephone Encounter (Signed)
Pt states he needs to schedule a Fu Colonoscopy per [facility] please call pt

## 2017-05-03 ENCOUNTER — Other Ambulatory Visit: Payer: Self-pay

## 2017-05-03 DIAGNOSIS — Z01818 Encounter for other preprocedural examination: Secondary | ICD-10-CM

## 2017-05-03 DIAGNOSIS — K6389 Other specified diseases of intestine: Secondary | ICD-10-CM

## 2017-05-03 MED ORDER — PEG 3350-KCL-NABCB-NACL-NASULF 236 G PO SOLR: ORAL | 0 refills | Status: DC

## 2017-05-03 NOTE — Telephone Encounter (Signed)
Tried calling pt back but vm was not set up.

## 2017-05-03 NOTE — Telephone Encounter (Signed)
Pt has been scheduled for repeat colonoscopy at [facility] with [place] on 05/30/17.

## 2017-05-19 ENCOUNTER — Telehealth: Payer: Self-pay | Admitting: Gastroenterology

## 2017-05-19 NOTE — Telephone Encounter (Signed)
Pt left vm to reschedule his procedure her states he has other procedure that need to be done

## 2017-05-19 NOTE — Telephone Encounter (Signed)
Pt has decided to keep colonoscopy appt for 05/30/17.

## 2017-05-30 ENCOUNTER — Encounter
Admission: RE | Disposition: A | Discharge status: Home / Self Care | Payer: Self-pay | Source: Ambulatory Visit | Attending: Gastroenterology

## 2017-05-30 ENCOUNTER — Ambulatory Visit: Payer: Medicare Other | Admitting: Anesthesiology

## 2017-05-30 ENCOUNTER — Ambulatory Visit
Admission: RE | Admit: 2017-05-30 | Discharge: 2017-05-30 | Disposition: A | Discharge status: Home / Self Care | Payer: Medicare Other | Source: Ambulatory Visit | Attending: Gastroenterology | Admitting: Gastroenterology

## 2017-05-30 ENCOUNTER — Encounter: Payer: Self-pay | Admitting: Anesthesiology

## 2017-05-30 ENCOUNTER — Other Ambulatory Visit: Payer: Self-pay

## 2017-05-30 DIAGNOSIS — Z5309 Procedure and treatment not carried out because of other contraindication: Secondary | ICD-10-CM | POA: Insufficient documentation

## 2017-05-30 DIAGNOSIS — D509 Iron deficiency anemia, unspecified: Secondary | ICD-10-CM | POA: Diagnosis present

## 2017-05-30 DIAGNOSIS — Z7901 Long term (current) use of anticoagulants: Secondary | ICD-10-CM | POA: Diagnosis not present

## 2017-05-30 DIAGNOSIS — K6389 Other specified diseases of intestine: Secondary | ICD-10-CM

## 2017-05-30 HISTORY — DX: Hyperlipidemia, unspecified: E78.5

## 2017-05-30 HISTORY — DX: Deep phlebothrombosis in pregnancy, unspecified trimester: O22.30

## 2017-05-30 LAB — GLUCOSE, CAPILLARY: Glucose-Capillary: 148 mg/dL — ABNORMAL HIGH (ref 65–99)

## 2017-05-30 SURGERY — COLONOSCOPY WITH PROPOFOL
Anesthesia: General

## 2017-05-30 MED ORDER — SODIUM CHLORIDE 0.9 % IV SOLN: INTRAVENOUS | Status: DC

## 2017-05-30 MED ORDER — PHENYLEPHRINE HCL 10 MG/ML IJ SOLN
INTRAMUSCULAR | Status: AC
  Filled 2017-05-30: qty 1

## 2017-05-30 MED ORDER — LIDOCAINE HCL (PF) 2 % IJ SOLN
INTRAMUSCULAR | Status: AC
  Filled 2017-05-30: qty 10

## 2017-05-30 MED ORDER — PROPOFOL 500 MG/50ML IV EMUL
INTRAVENOUS | Status: AC
  Filled 2017-05-30: qty 50

## 2017-05-30 NOTE — OR Nursing (Signed)
Dr. Allen Norris into speak with pt regarding stopping Coumadin 2 days ago.  Procedure has been cancelled for today. Dr. Allen Norris to talk with surgeon and will contact pt regarding plan of care.

## 2017-07-20 NOTE — Progress Notes (Signed)
Please place orders in epic. 

## 2017-07-25 ENCOUNTER — Ambulatory Visit: Payer: Self-pay | Admitting: Surgery

## 2017-07-25 NOTE — H&P (Signed)
CC: Endoscopically unresectable polyp - per colonoscopy report in cecum  HPI: Shane Bean is a very pleasant 62yoM with hx of HTN, DM, HLD, aortic valve s/p replacement (on warfarin), aortic dissection here today for follow-up from Dr. Allen Bean. He underwent screening colonoscopy 04/04/17 as well as EGD. On colonoscopy he had a 3 mm polyp in the sigmoid as well as descending colon which were both removed and came back HP in sigmoid and TA in descending; a large nonobstructing mass was found in the cecum which was non-circumferential and biopsied-returned tubular adenoma; pan colonic diverticulosis. The cecal polyp was not felt to be endoscopically resectable. On EGD was found to have H. pylori and is currently on treatment for this. He denies any complaints today. He denies any abdominal pain, nausea, vomiting, blood in stool or melena. 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  FHx: Denies FHx of malignancy  Social: Denies use of tobacco/EtOH/drugs  ROS: A comprehensive 10 system review of systems was completed with the patient and pertinent findings as noted above.  The patient is a 62 year old male.   Past Surgical History (Shane Bean, Shane Bean; 04/12/2017 3:41 PM) Colon Polyp Removal - Colonoscopy  Laparoscopic Inguinal Hernia Surgery  Left.  Diagnostic Studies History (Shane Bean, Shane Bean; 04/12/2017 3:41 PM) Colonoscopy  within last year  Allergies (Shane Bean, Bean; 04/12/2017 3:43 PM) Chlorthalidone *DIURETICS*  HydrALAZINE HCl *ANTIHYPERTENSIVES*  Lisinopril *ANTIHYPERTENSIVES*  Allergies Reconciled   Medication History (Shane Bean, Bean; 04/12/2017 3:47 PM) Allopurinol (100MG   Tablet, Oral) Active. AmLODIPine Besylate (10MG  Tablet, Oral) Active. Amoxicillin (500MG  Tablet, Oral) Active. Aspirin (325MG  Tablet, Oral) Active. Biaxin (500MG  Tablet, Oral) Active. Doxazosin Mesylate (8MG  Tablet, Oral) Active. GlipiZIDE (5MG  Tablet, Oral) Active. GuaiFENesin (200MG  Tablet, Oral) Active. Metoprolol Succinate ER (100MG  Tablet ER 24HR, Oral) Active. Omeprazole (40MG  Capsule DR, Oral) Active. Zantac (150MG  Tablet, Oral) Active. Simvastatin (10MG  Tablet, Oral) Active. Aldactone (25MG  Tablet, Oral) Active. Warfarin Sodium (7.5MG  Tablet, Oral) Active. Medications Reconciled  Social History (Shane Bean, [place]; 04/12/2017 3:41 PM) Alcohol use  Moderate alcohol use. No caffeine use  No drug use  Tobacco use  Never smoker.  Family History (Shane Bean, Shane Bean; 04/12/2017 3:41 PM) Arthritis  Father. Colon Cancer  Brother. Diabetes Mellitus  Family Members In General. Hypertension  Family Members In General, Father. Malignant Neoplasm Of Pancreas  Brother.  Other Problems (Shane Bean, Shane Bean; 04/12/2017 3:41 PM) Arthritis  Diabetes Mellitus  Gastroesophageal Reflux Disease  Hepatitis  High blood pressure  Inguinal Hernia  Kidney Stone     Review of Systems Shane Gave M. Shae Hinnenkamp MD; 04/12/2017 4:42 PM) General Not Present- Chills and Fever. Skin Not Present- Change in Wart/Mole, Dryness, Hives, Jaundice, New Lesions, Non-Healing Wounds, Rash and Ulcer. HEENT Present- Seasonal Allergies and Sinus Pain. Not Present- Earache, Hearing Loss, Hoarseness, Nose Bleed, Oral Ulcers, Ringing in the Ears, Sore Throat, Visual Disturbances, Wears glasses/contact lenses and Yellow Eyes. Cardiovascular Present- Swelling of Extremities. Not Present- Chest Pain, Difficulty Breathing Lying Down, Leg Cramps, Palpitations, Rapid Heart Rate and Shortness of Breath. Gastrointestinal Present- Bloating. Not Present- Abdominal Pain, Bloody Stool, Change in  Bowel Habits, Chronic diarrhea, Constipation, Difficulty Swallowing, Excessive gas, Gets full quickly at meals, Hemorrhoids, Indigestion, Nausea,  Rectal Pain and Vomiting. Male Genitourinary Not Present- Blood in Urine, Change in Urinary Stream, Frequency, Impotence, Nocturia, Painful Urination, Urgency and Urine Leakage. Musculoskeletal Present- Swelling of Extremities. Not Present- Back Pain, Joint Pain, Joint Stiffness, Muscle Pain and Muscle Weakness. Neurological Not Present- Decreased Memory, Fainting, Headaches, Numbness, Seizures, Tingling, Tremor, Trouble walking and Weakness. Psychiatric Not Present- Anxiety, Bipolar, Change in Sleep Pattern, Depression, Fearful and Frequent crying. Endocrine Not Present- Cold Intolerance, Excessive Hunger, Hair Changes, Heat Intolerance, Hot flashes and New Diabetes. Hematology Present- Blood Thinners. Not Present- Easy Bruising, Excessive bleeding, Gland problems, HIV and Persistent Infections.  Vitals (Shane Bean; 04/12/2017 3:42 PM) 04/12/2017 3:42 PM Weight: 292 lb Height: 70in Body Surface Area: 2.45 m Body Mass Index: 41.9 kg/m  Temp.: 97.10F  Pulse: 81 (Regular)  BP: 138/84 (Sitting, Left Arm, Standard)       Physical Exam Shane Gave M. Shane Stoltz MD; 04/12/2017 4:43 PM) The physical exam findings are as follows: Note:Constitutional: No acute distress; conversant; no deformities Eyes: Moist conjunctiva; no lid lag; anicteric sclerae; pupils equal round and reactive to light Neck: Trachea midline; no palpable thyromegaly Lungs: Normal respiratory effort; no tactile fremitus; midline sternal scar. CV: Regular rate and rhythm; no palpable thrill; no pitting edema GI: Abdomen obese soft, nontender, nondistended; freely reducible umbilial hernia. No palpable hepatosplenomegaly MSK: Normal gait; no clubbing/cyanosis Psychiatric: Appropriate affect; alert and oriented 3 Lymphatic: No palpable cervical or axillary  lymphadenopathy    Assessment & Plan Shane Gave M. Anthonymichael Munday MD; 04/12/2017 4:48 PM) COLON POLYP (K63.5) Impression: [name] is a very pleasant 62yoM with HTN, DM, HLD, aortic valve s/p replacement (on warfarin), aortic dissection - here with endoscopically unresectable polyp in the cecum  -Will have him evaluated for cardiac clearance, anticoagulation plan perioperatively and assessment of his chronic dissection with his cardiologist Dr. Corey Skains -CTA A/P for aortic dissection -I will have him follow-up following the above prior to scheulding for surgery -We discussed the anatomy physiology of the GI tract at length with associated pictures and pathophysiology of colon polyps. We discussed the potential for the polyp to harbor or develop into colon cancer. -In the event this is in the cecum, will plan laparoscopic vs open right hemicolectomy; all other indicated procedures. -The planned procedure, material risks (including, but not limited to, pain, bleeding, infection, scarring, need for blood transfusion, damage to surrounding structures- blood vessels/nerves/viscus/organs, damage to ureter, urine leak, leak from anastomosis, need for additional procedures, need for stoma which may be permanent, hernia, recurrence, pneumonia, heart attack, stroke, death) benefits and alternatives to surgery were discussed at length. The patient's questions were answered to his satisfaction, he voiced understanding and elected to proceed with surgery.

## 2017-08-08 NOTE — Progress Notes (Signed)
05-19-17 Cardiothoracic clearance from [facility], [place]  02-16-17 (Epic) EKG, and CXR

## 2017-08-08 NOTE — Patient Instructions (Addendum)
Shane Bean  08/08/2017   Your procedure is scheduled on: 08-17-17   Report to [facility] Main  Entrance    Report to Admitting at 7:30 AM    Call this number if you have problems the morning of surgery [phone]    Remember: DRINK 2 PRESURGERY ENSURE DRINKS THE NIGHT BEFORE SURGERY AT  1000 PM AND 1 PRESURGERY DRINK THE DAY OF THE PROCEDURE 3 HOURS PRIOR TO SCHEDULED SURGERY. NO SOLIDS AFTER MIDNIGHT THE DAY PRIOR TO THE SURGERY. NOTHING BY MOUTH EXCEPT CLEAR LIQUIDS UNTIL THREE HOURS PRIOR TO SCHEDULED SURGERY. PLEASE FINISH PRESURGERY ENSURE DRINK PER SURGEON ORDER 3 HOURS PRIOR TO SCHEDULED SURGERY TIME WHICH NEEDS TO BE COMPLETED AT 6:30 AM_________.     CLEAR LIQUID DIET   Foods Allowed                                                                     Foods Excluded  Coffee and tea, regular and decaf                             liquids that you cannot  Plain Jell-O in any flavor                                             see through such as: Fruit ices (not with fruit pulp)                                     milk, soups, orange juice  Iced Popsicles                                    All solid food Carbonated beverages, regular and diet                                    Cranberry, grape and apple juices Sports drinks like Gatorade Lightly seasoned clear broth or consume(fat free) Sugar, honey syrup  Sample Menu Breakfast                                Lunch                                     Supper Cranberry juice                    Beef broth                            Chicken broth Jell-O  Grape juice                           Apple juice Coffee or tea                        Jell-O                                      Popsicle                                                Coffee or tea                        Coffee or  tea  _____________________________________________________________________     Take these medicines the morning of surgery with A SIP OF WATER: Metoprolol Tartrate (Lopressor)   DO NOT TAKE ANY DIABETIC MEDICATIONS DAY OF YOUR SURGERY                               You may not have any metal on your body including hair pins and              piercings  Do not wear jewelry, make-up, lotions, powders, cologne or deodorant             Men may shave face and neck.   Do not bring valuables to the hospital. [place].  Contacts, dentures or bridgework may not be worn into surgery.  Leave suitcase in the car. After surgery it may be brought to your room.       Special Instructions: Follow your prep instructions as provided by your surgeon              Please read over the following fact sheets you were given: _____________________________________________________________________  How to Manage Your Diabetes Before and After Surgery  Why is it important to control my blood sugar before and after surgery? . Improving blood sugar levels before and after surgery helps healing and can limit problems. . A way of improving blood sugar control is eating a healthy diet by: o  Eating less sugar and carbohydrates o  Increasing activity/exercise o  Talking with your doctor about reaching your blood sugar goals . High blood sugars (greater than 180 mg/dL) can raise your risk of infections and slow your recovery, so you will need to focus on controlling your diabetes during the weeks before surgery. . Make sure that the doctor who takes care of your diabetes knows about your planned surgery including the date and location.  How do I manage my blood sugar before surgery? . Check your blood sugar at least 4 times a day, starting 2 days before surgery, to make sure that the level is not too high or low. o Check your blood sugar the morning of your surgery  when you wake up and every 2 hours until you get to the Short Stay unit. . If your blood sugar is less than 70 mg/dL, you will need to treat for  low blood sugar: o Do not take insulin. o Treat a low blood sugar (less than 70 mg/dL) with  cup of clear juice (cranberry or apple), 4 glucose tablets, OR glucose gel. o Recheck blood sugar in 15 minutes after treatment (to make sure it is greater than 70 mg/dL). If your blood sugar is not greater than 70 mg/dL on recheck, call [phone] for further instructions. . Report your blood sugar to the short stay nurse when you get to Short Stay.  . If you are admitted to the hospital after surgery: o Your blood sugar will be checked by the staff and you will probably be given insulin after surgery (instead of oral diabetes medicines) to make sure you have good blood sugar levels. o The goal for blood sugar control after surgery is 80-180 mg/dL.   WHAT DO I DO ABOUT MY DIABETES MEDICATION?  Marland Kitchen Do not take oral diabetes medicines (pills) the morning of surgery.  . THE DAY BEFORE SURGERY, take only your morning and/or lunch dose of Glipizide. Take your usual dose of Metformin                [facility] - Preparing for Surgery Before surgery, you can play an important role.  Because skin is not sterile, your skin needs to be as free of germs as possible.  You can reduce the number of germs on your skin by washing with CHG (chlorahexidine gluconate) soap before surgery.  CHG is an antiseptic cleaner which kills germs and bonds with the skin to continue killing germs even after washing. Please DO NOT use if you have an allergy to CHG or antibacterial soaps.  If your skin becomes reddened/irritated stop using the CHG and inform your nurse when you arrive at Short Stay. Do not shave (including legs and underarms) for at least 48 hours prior to the first CHG shower.  You may shave your face/neck. Please follow these instructions carefully:  1.  Shower with CHG  Soap the night before surgery and the  morning of Surgery.  2.  If you choose to wash your hair, wash your hair first as usual with your  normal  shampoo.  3.  After you shampoo, rinse your hair and body thoroughly to remove the  shampoo.                           4.  Use CHG as you would any other liquid soap.  You can apply chg directly  to the skin and wash                       Gently with a scrungie or clean washcloth.  5.  Apply the CHG Soap to your body ONLY FROM THE NECK DOWN.   Do not use on face/ open                           Wound or open sores. Avoid contact with eyes, ears mouth and genitals (private parts).                       Wash face,  Genitals (private parts) with your normal soap.             6.  Wash thoroughly, paying special attention to the area where your surgery  will be performed.  7.  Thoroughly rinse your body  with warm water from the neck down.  8.  DO NOT shower/wash with your normal soap after using and rinsing off  the CHG Soap.                9.  Pat yourself dry with a clean towel.            10.  Wear clean pajamas.            11.  Place clean sheets on your bed the night of your first shower and do not  sleep with pets. Day of Surgery : Do not apply any lotions/deodorants the morning of surgery.  Please wear clean clothes to the hospital/surgery center.  FAILURE TO FOLLOW THESE INSTRUCTIONS MAY RESULT IN THE CANCELLATION OF YOUR SURGERY PATIENT SIGNATURE_________________________________  NURSE SIGNATURE__________________________________  ________________________________________________________________________  WHAT IS A BLOOD TRANSFUSION? Blood Transfusion Information  A transfusion is the replacement of blood or some of its parts. Blood is made up of multiple cells which provide different functions.  Red blood cells carry oxygen and are used for blood loss replacement.  White blood cells fight against infection.  Platelets control  bleeding.  Plasma helps clot blood.  Other blood products are available for specialized needs, such as hemophilia or other clotting disorders. BEFORE THE TRANSFUSION  Who gives blood for transfusions?   Healthy volunteers who are fully evaluated to make sure their blood is safe. This is blood bank blood. Transfusion therapy is the safest it has ever been in the practice of medicine. Before blood is taken from a donor, a complete history is taken to make sure that person has no history of diseases nor engages in risky social behavior (examples are intravenous drug use or sexual activity with multiple partners). The donor's travel history is screened to minimize risk of transmitting infections, such as malaria. The donated blood is tested for signs of infectious diseases, such as HIV and hepatitis. The blood is then tested to be sure it is compatible with you in order to minimize the chance of a transfusion reaction. If you or a relative donates blood, this is often done in anticipation of surgery and is not appropriate for emergency situations. It takes many days to process the donated blood. RISKS AND COMPLICATIONS Although transfusion therapy is very safe and saves many lives, the main dangers of transfusion include:   Getting an infectious disease.  Developing a transfusion reaction. This is an allergic reaction to something in the blood you were given. Every precaution is taken to prevent this. The decision to have a blood transfusion has been considered carefully by your caregiver before blood is given. Blood is not given unless the benefits outweigh the risks. AFTER THE TRANSFUSION  Right after receiving a blood transfusion, you will usually feel much better and more energetic. This is especially true if your red blood cells have gotten low (anemic). The transfusion raises the level of the red blood cells which carry oxygen, and this usually causes an energy increase.  The nurse  administering the transfusion will monitor you carefully for complications. HOME CARE INSTRUCTIONS  No special instructions are needed after a transfusion. You may find your energy is better. Speak with your caregiver about any limitations on activity for underlying diseases you may have. SEEK MEDICAL CARE IF:   Your condition is not improving after your transfusion.  You develop redness or irritation at the intravenous (IV) site. SEEK IMMEDIATE MEDICAL CARE IF:  Any of the following symptoms occur  over the next 12 hours:  Shaking chills.  You have a temperature by mouth above 102 F (38.9 C), not controlled by medicine.  Chest, back, or muscle pain.  People around you feel you are not acting correctly or are confused.  Shortness of breath or difficulty breathing.  Dizziness and fainting.  You get a rash or develop hives.  You have a decrease in urine output.  Your urine turns a dark color or changes to pink, red, or brown. Any of the following symptoms occur over the next 10 days:  You have a temperature by mouth above 102 F (38.9 C), not controlled by medicine.  Shortness of breath.  Weakness after normal activity.  The white part of the eye turns yellow (jaundice).  You have a decrease in the amount of urine or are urinating less often.  Your urine turns a dark color or changes to pink, red, or brown. Document Released: 12/18/1999 Document Revised: 03/14/2011 Document Reviewed: 08/06/2007 [facility] Patient Information 2014 [place], [place].  _______________________________________________________________________

## 2017-08-10 ENCOUNTER — Encounter (HOSPITAL_COMMUNITY): Payer: Self-pay

## 2017-08-10 ENCOUNTER — Encounter (HOSPITAL_COMMUNITY)
Admission: RE | Admit: 2017-08-10 | Discharge: 2017-08-10 | Disposition: A | Discharge status: Home / Self Care | Payer: Medicare Other | Source: Ambulatory Visit | Attending: Surgery | Admitting: Surgery

## 2017-08-10 ENCOUNTER — Other Ambulatory Visit: Payer: Self-pay

## 2017-08-10 DIAGNOSIS — K635 Polyp of colon: Secondary | ICD-10-CM | POA: Insufficient documentation

## 2017-08-10 DIAGNOSIS — Z01812 Encounter for preprocedural laboratory examination: Secondary | ICD-10-CM | POA: Insufficient documentation

## 2017-08-10 LAB — CBC WITH DIFFERENTIAL/PLATELET
BASOS ABS: 0 10*3/uL (ref 0.0–0.1)
BASOS PCT: 0 %
EOS PCT: 4 %
Eosinophils Absolute: 0.3 10*3/uL (ref 0.0–0.7)
HCT: 39.3 % (ref 39.0–52.0)
Hemoglobin: 12.6 g/dL — ABNORMAL LOW (ref 13.0–17.0)
LYMPHS PCT: 21 %
Lymphs Abs: 1.5 10*3/uL (ref 0.7–4.0)
MCH: 25 pg — ABNORMAL LOW (ref 26.0–34.0)
MCHC: 32.1 g/dL (ref 30.0–36.0)
MCV: 78 fL (ref 78.0–100.0)
MONO ABS: 0.6 10*3/uL (ref 0.1–1.0)
Monocytes Relative: 9 %
NEUTROS ABS: 4.7 10*3/uL (ref 1.7–7.7)
Neutrophils Relative %: 66 %
PLATELETS: 228 10*3/uL (ref 150–400)
RBC: 5.04 MIL/uL (ref 4.22–5.81)
RDW: 16.4 % — AB (ref 11.5–15.5)
WBC: 7.1 10*3/uL (ref 4.0–10.5)

## 2017-08-10 LAB — COMPREHENSIVE METABOLIC PANEL
ALBUMIN: 3.9 g/dL (ref 3.5–5.0)
ALT: 21 U/L (ref 0–44)
AST: 24 U/L (ref 15–41)
Alkaline Phosphatase: 43 U/L (ref 38–126)
Anion gap: 10 (ref 5–15)
BUN: 19 mg/dL (ref 8–23)
CHLORIDE: 104 mmol/L (ref 98–111)
CO2: 25 mmol/L (ref 22–32)
CREATININE: 1.21 mg/dL (ref 0.61–1.24)
Calcium: 9.4 mg/dL (ref 8.9–10.3)
GFR calc Af Amer: >60 mL/min (ref >60)
GLUCOSE: 243 mg/dL — AB (ref 70–99)
POTASSIUM: 4.4 mmol/L (ref 3.5–5.1)
Sodium: 139 mmol/L (ref 135–145)
Total Bilirubin: 0.8 mg/dL (ref 0.3–1.2)
Total Protein: 7.6 g/dL (ref 6.5–8.1)

## 2017-08-10 LAB — HEMOGLOBIN A1C
Hgb A1c MFr Bld: 8.8 % — ABNORMAL HIGH (ref 4.8–5.6)
Mean Plasma Glucose: 205.86 mg/dL

## 2017-08-10 LAB — APTT: APTT: 32 s (ref 24–36)

## 2017-08-10 LAB — GLUCOSE, CAPILLARY: GLUCOSE-CAPILLARY: 223 mg/dL — AB (ref 70–99)

## 2017-08-10 LAB — PROTIME-INR
INR: 2.03
Prothrombin Time: 22.8 seconds — ABNORMAL HIGH (ref 11.4–15.2)

## 2017-08-11 LAB — ABO/RH: ABO/RH(D): B POS

## 2017-08-11 NOTE — Progress Notes (Signed)
HGA1C routed to Dr. Dema Severin for review.

## 2017-08-16 NOTE — Anesthesia Preprocedure Evaluation (Addendum)
Anesthesia Evaluation  Patient identified by MRN, date of birth, ID band Patient awake    Reviewed: Allergy & Precautions, H&P , NPO status , Patient's Chart, lab work & pertinent test results, reviewed documented beta blocker date and time   History of Anesthesia Complications Negative for: history of anesthetic complications  Airway Mallampati: III  TM Distance: >3 FB Neck ROM: Full    Dental  (+) Partial Upper, Dental Advisory Given   Pulmonary neg pulmonary ROS,    Pulmonary exam normal        Cardiovascular Exercise Tolerance: Poor hypertension, On Medications and Pt. on home beta blockers (-) angina+ CABG and + Peripheral Vascular Disease  Normal cardiovascular exam  The left ventricular size is normal.  Left ventricular systolic function is normal. LV ejection fraction = 65-70%. The left ventricular wall motion is normal.   Neuro/Psych negative neurological ROS  negative psych ROS   GI/Hepatic Neg liver ROS, GERD  Medicated,  Endo/Other  diabetes, Well Controlled, Type 2, Oral Hypoglycemic AgentsMorbid obesity  Renal/GU Renal disease  negative genitourinary   Musculoskeletal   Abdominal   Peds  Hematology negative hematology ROS (+)   Anesthesia Other Findings   Reproductive/Obstetrics negative OB ROS                          Anesthesia Physical  Anesthesia Plan  ASA: III  Anesthesia Plan: General   Post-op Pain Management:    Induction: Intravenous  PONV Risk Score and Plan: 3 and Ondansetron, Dexamethasone and Scopolamine patch - Pre-op  Airway Management Planned: Oral ETT  Additional Equipment:   Intra-op Plan:   Post-operative Plan: Extubation in OR  Informed Consent: I have reviewed the patients History and Physical, chart, labs and discussed the procedure including the risks, benefits and alternatives for the proposed anesthesia with the patient or authorized  representative who has indicated his/her understanding and acceptance.   Dental advisory given  Plan Discussed with: CRNA, Anesthesiologist and Surgeon  Anesthesia Plan Comments:        Anesthesia Quick Evaluation

## 2017-08-17 ENCOUNTER — Other Ambulatory Visit: Payer: Self-pay

## 2017-08-17 ENCOUNTER — Encounter (HOSPITAL_COMMUNITY): Payer: Self-pay

## 2017-08-17 ENCOUNTER — Encounter (HOSPITAL_COMMUNITY)
Admission: RE | Disposition: A | Discharge status: Home / Self Care | Payer: Self-pay | Source: Other Acute Inpatient Hospital | Attending: Surgery

## 2017-08-17 ENCOUNTER — Inpatient Hospital Stay (HOSPITAL_COMMUNITY): Payer: Medicare Other | Admitting: Certified Registered Nurse Anesthetist

## 2017-08-17 ENCOUNTER — Inpatient Hospital Stay (HOSPITAL_COMMUNITY)
Admission: RE | Admit: 2017-08-17 | Discharge: 2017-08-26 | DRG: 330 | Disposition: A | Discharge status: Home / Self Care | Payer: Medicare Other | Source: Other Acute Inpatient Hospital | Attending: Surgery | Admitting: Surgery

## 2017-08-17 DIAGNOSIS — E785 Hyperlipidemia, unspecified: Secondary | ICD-10-CM | POA: Diagnosis present

## 2017-08-17 DIAGNOSIS — K429 Umbilical hernia without obstruction or gangrene: Secondary | ICD-10-CM | POA: Diagnosis present

## 2017-08-17 DIAGNOSIS — Z952 Presence of prosthetic heart valve: Secondary | ICD-10-CM | POA: Diagnosis not present

## 2017-08-17 DIAGNOSIS — Z951 Presence of aortocoronary bypass graft: Secondary | ICD-10-CM | POA: Diagnosis not present

## 2017-08-17 DIAGNOSIS — I10 Essential (primary) hypertension: Secondary | ICD-10-CM | POA: Diagnosis present

## 2017-08-17 DIAGNOSIS — K635 Polyp of colon: Secondary | ICD-10-CM | POA: Diagnosis present

## 2017-08-17 DIAGNOSIS — Z86711 Personal history of pulmonary embolism: Secondary | ICD-10-CM | POA: Diagnosis not present

## 2017-08-17 DIAGNOSIS — D120 Benign neoplasm of cecum: Secondary | ICD-10-CM | POA: Diagnosis present

## 2017-08-17 DIAGNOSIS — K5730 Diverticulosis of large intestine without perforation or abscess without bleeding: Secondary | ICD-10-CM | POA: Diagnosis present

## 2017-08-17 DIAGNOSIS — Z6841 Body Mass Index (BMI) 40.0 and over, adult: Secondary | ICD-10-CM | POA: Diagnosis not present

## 2017-08-17 DIAGNOSIS — E1151 Type 2 diabetes mellitus with diabetic peripheral angiopathy without gangrene: Secondary | ICD-10-CM | POA: Diagnosis not present

## 2017-08-17 DIAGNOSIS — Z888 Allergy status to other drugs, medicaments and biological substances status: Secondary | ICD-10-CM | POA: Diagnosis not present

## 2017-08-17 DIAGNOSIS — K921 Melena: Secondary | ICD-10-CM | POA: Diagnosis not present

## 2017-08-17 DIAGNOSIS — K219 Gastro-esophageal reflux disease without esophagitis: Secondary | ICD-10-CM | POA: Diagnosis present

## 2017-08-17 DIAGNOSIS — Z86718 Personal history of other venous thrombosis and embolism: Secondary | ICD-10-CM

## 2017-08-17 HISTORY — PX: LAPAROSCOPIC RIGHT HEMI COLECTOMY: SHX5926

## 2017-08-17 LAB — PROTIME-INR
INR: 1.33
Prothrombin Time: 16.4 seconds — ABNORMAL HIGH (ref 11.4–15.2)

## 2017-08-17 LAB — GLUCOSE, CAPILLARY
GLUCOSE-CAPILLARY: 249 mg/dL — AB (ref 70–99)
GLUCOSE-CAPILLARY: 271 mg/dL — AB (ref 70–99)
GLUCOSE-CAPILLARY: 266 mg/dL — AB (ref 70–99)
GLUCOSE-CAPILLARY: 249 mg/dL — AB (ref 70–99)

## 2017-08-17 LAB — TYPE AND SCREEN
ABO/RH(D): B POS
ANTIBODY SCREEN: NEGATIVE

## 2017-08-17 SURGERY — LAPAROSCOPIC RIGHT HEMI COLECTOMY
Anesthesia: General | Laterality: Right

## 2017-08-17 MED ORDER — FAMOTIDINE 20 MG PO TABS
10.0000 mg | ORAL_TABLET | Freq: Every day | ORAL | Status: DC
  Administered 2017-08-17 – 2017-08-25 (×9): 10 mg via ORAL
  Filled 2017-08-17 (×10): qty 1

## 2017-08-17 MED ORDER — LIDOCAINE 2% (20 MG/ML) 5 ML SYRINGE
INTRAMUSCULAR | Status: DC | PRN
  Administered 2017-08-17: 100 mg via INTRAVENOUS

## 2017-08-17 MED ORDER — BUPIVACAINE LIPOSOME 1.3 % IJ SUSP
INTRAMUSCULAR | Status: DC | PRN
  Administered 2017-08-17: 20 mL

## 2017-08-17 MED ORDER — HYDROMORPHONE HCL 1 MG/ML IJ SOLN
0.5000 mg | INTRAMUSCULAR | Status: DC | PRN
  Administered 2017-08-17: 0.5 mg via INTRAVENOUS
  Filled 2017-08-17: qty 0.5

## 2017-08-17 MED ORDER — SACCHAROMYCES BOULARDII 250 MG PO CAPS
250.0000 mg | ORAL_CAPSULE | Freq: Two times a day (BID) | ORAL | Status: DC
  Administered 2017-08-17 – 2017-08-25 (×17): 250 mg via ORAL
  Filled 2017-08-17 (×18): qty 1

## 2017-08-17 MED ORDER — INSULIN ASPART 100 UNIT/ML ~~LOC~~ SOLN
0.0000 [IU] | Freq: Three times a day (TID) | SUBCUTANEOUS | Status: DC
  Administered 2017-08-17 (×2): 8 [IU] via SUBCUTANEOUS
  Administered 2017-08-18 (×2): 3 [IU] via SUBCUTANEOUS
  Administered 2017-08-18: 5 [IU] via SUBCUTANEOUS
  Administered 2017-08-19 (×2): 3 [IU] via SUBCUTANEOUS
  Administered 2017-08-19: 5 [IU] via SUBCUTANEOUS
  Administered 2017-08-20 (×2): 3 [IU] via SUBCUTANEOUS
  Administered 2017-08-20 – 2017-08-21 (×2): 5 [IU] via SUBCUTANEOUS

## 2017-08-17 MED ORDER — GABAPENTIN 300 MG PO CAPS
300.0000 mg | ORAL_CAPSULE | ORAL | Status: AC
  Administered 2017-08-17: 300 mg via ORAL
  Filled 2017-08-17: qty 1

## 2017-08-17 MED ORDER — LACTATED RINGERS IV SOLN
INTRAVENOUS | Status: DC | PRN
  Administered 2017-08-17: 08:00:00 via INTRAVENOUS

## 2017-08-17 MED ORDER — ONDANSETRON HCL 4 MG/2ML IJ SOLN
INTRAMUSCULAR | Status: DC | PRN
  Administered 2017-08-17: 4 mg via INTRAVENOUS

## 2017-08-17 MED ORDER — BUPIVACAINE-EPINEPHRINE (PF) 0.25% -1:200000 IJ SOLN
INTRAMUSCULAR | Status: AC
  Filled 2017-08-17: qty 30

## 2017-08-17 MED ORDER — PROPOFOL 10 MG/ML IV BOLUS
INTRAVENOUS | Status: AC
Start: 2017-08-17 — End: ?
  Filled 2017-08-17: qty 40

## 2017-08-17 MED ORDER — GLIPIZIDE 10 MG PO TABS
10.0000 mg | ORAL_TABLET | Freq: Two times a day (BID) | ORAL | Status: DC
  Administered 2017-08-17 – 2017-08-26 (×18): 10 mg via ORAL
  Filled 2017-08-17 (×18): qty 1

## 2017-08-17 MED ORDER — ALBUMIN HUMAN 5 % IV SOLN
INTRAVENOUS | Status: DC | PRN
  Administered 2017-08-17: 11:00:00 via INTRAVENOUS

## 2017-08-17 MED ORDER — FENTANYL CITRATE (PF) 100 MCG/2ML IJ SOLN
INTRAMUSCULAR | Status: AC
  Filled 2017-08-17: qty 2

## 2017-08-17 MED ORDER — SPIRONOLACTONE 25 MG PO TABS
25.0000 mg | ORAL_TABLET | Freq: Every day | ORAL | Status: DC
  Administered 2017-08-17 – 2017-08-25 (×9): 25 mg via ORAL
  Filled 2017-08-17 (×9): qty 1

## 2017-08-17 MED ORDER — DOXAZOSIN MESYLATE 2 MG PO TABS
8.0000 mg | ORAL_TABLET | Freq: Every day | ORAL | Status: DC
  Administered 2017-08-17 – 2017-08-25 (×9): 8 mg via ORAL
  Filled 2017-08-17 (×9): qty 4

## 2017-08-17 MED ORDER — ACETAMINOPHEN 500 MG PO TABS
1000.0000 mg | ORAL_TABLET | Freq: Four times a day (QID) | ORAL | Status: AC
  Administered 2017-08-17 – 2017-08-24 (×15): 1000 mg via ORAL
  Filled 2017-08-17 (×19): qty 2

## 2017-08-17 MED ORDER — ALVIMOPAN 12 MG PO CAPS
12.0000 mg | ORAL_CAPSULE | Freq: Two times a day (BID) | ORAL | Status: DC
  Administered 2017-08-18 – 2017-08-19 (×4): 12 mg via ORAL
  Filled 2017-08-17 (×4): qty 1

## 2017-08-17 MED ORDER — HEPARIN SODIUM (PORCINE) 5000 UNIT/ML IJ SOLN
5000.0000 [IU] | Freq: Once | INTRAMUSCULAR | Status: AC
  Administered 2017-08-17: 5000 [IU] via SUBCUTANEOUS
  Filled 2017-08-17: qty 1

## 2017-08-17 MED ORDER — ALVIMOPAN 12 MG PO CAPS
12.0000 mg | ORAL_CAPSULE | ORAL | Status: AC
  Administered 2017-08-17: 12 mg via ORAL
  Filled 2017-08-17: qty 1

## 2017-08-17 MED ORDER — ONDANSETRON HCL 4 MG/2ML IJ SOLN
INTRAMUSCULAR | Status: AC
  Filled 2017-08-17: qty 2

## 2017-08-17 MED ORDER — MIDAZOLAM HCL 2 MG/2ML IJ SOLN
INTRAMUSCULAR | Status: AC
  Filled 2017-08-17: qty 2

## 2017-08-17 MED ORDER — MIDAZOLAM HCL 5 MG/5ML IJ SOLN
INTRAMUSCULAR | Status: DC | PRN
  Administered 2017-08-17: 2 mg via INTRAVENOUS

## 2017-08-17 MED ORDER — SCOPOLAMINE 1 MG/3DAYS TD PT72
1.0000 | MEDICATED_PATCH | TRANSDERMAL | Status: DC
  Administered 2017-08-17: 1.5 mg via TRANSDERMAL
  Filled 2017-08-17: qty 1

## 2017-08-17 MED ORDER — METRONIDAZOLE 500 MG PO TABS: 1000.0000 mg | ORAL_TABLET | ORAL | Status: DC

## 2017-08-17 MED ORDER — LIDOCAINE HCL 2 % IJ SOLN
INTRAMUSCULAR | Status: AC
  Filled 2017-08-17: qty 20

## 2017-08-17 MED ORDER — ROCURONIUM BROMIDE 10 MG/ML (PF) SYRINGE
PREFILLED_SYRINGE | INTRAVENOUS | Status: DC | PRN
  Administered 2017-08-17: 20 mg via INTRAVENOUS
  Administered 2017-08-17 (×2): 10 mg via INTRAVENOUS
  Administered 2017-08-17: 50 mg via INTRAVENOUS
  Administered 2017-08-17: 10 mg via INTRAVENOUS

## 2017-08-17 MED ORDER — SUGAMMADEX SODIUM 500 MG/5ML IV SOLN
INTRAVENOUS | Status: DC | PRN
  Administered 2017-08-17: 260 mg via INTRAVENOUS

## 2017-08-17 MED ORDER — SODIUM CHLORIDE 0.9 % IV SOLN
2.0000 g | INTRAVENOUS | Status: AC
  Administered 2017-08-17: 2 g via INTRAVENOUS
  Filled 2017-08-17: qty 2

## 2017-08-17 MED ORDER — METOPROLOL TARTRATE 50 MG PO TABS
100.0000 mg | ORAL_TABLET | Freq: Two times a day (BID) | ORAL | Status: DC
  Administered 2017-08-17 – 2017-08-26 (×14): 100 mg via ORAL
  Filled 2017-08-17 (×17): qty 2

## 2017-08-17 MED ORDER — ALLOPURINOL 100 MG PO TABS
100.0000 mg | ORAL_TABLET | Freq: Every day | ORAL | Status: DC
  Administered 2017-08-17 – 2017-08-25 (×9): 100 mg via ORAL
  Filled 2017-08-17 (×10): qty 1

## 2017-08-17 MED ORDER — FENTANYL CITRATE (PF) 100 MCG/2ML IJ SOLN
25.0000 ug | INTRAMUSCULAR | Status: DC | PRN
  Administered 2017-08-17 (×2): 25 ug via INTRAVENOUS
  Administered 2017-08-17: 50 ug via INTRAVENOUS

## 2017-08-17 MED ORDER — BUPIVACAINE LIPOSOME 1.3 % IJ SUSP
20.0000 mL | Freq: Once | INTRAMUSCULAR | Status: DC
  Filled 2017-08-17: qty 20

## 2017-08-17 MED ORDER — ACETAMINOPHEN 500 MG PO TABS
1000.0000 mg | ORAL_TABLET | ORAL | Status: AC
  Administered 2017-08-17: 1000 mg via ORAL
  Filled 2017-08-17: qty 2

## 2017-08-17 MED ORDER — CHLORHEXIDINE GLUCONATE CLOTH 2 % EX PADS: 6.0000 | MEDICATED_PAD | Freq: Once | CUTANEOUS | Status: DC

## 2017-08-17 MED ORDER — DEXAMETHASONE SODIUM PHOSPHATE 10 MG/ML IJ SOLN
INTRAMUSCULAR | Status: AC
  Filled 2017-08-17: qty 1

## 2017-08-17 MED ORDER — ALUM & MAG HYDROXIDE-SIMETH 200-200-20 MG/5ML PO SUSP
30.0000 mL | Freq: Four times a day (QID) | ORAL | Status: DC | PRN
  Administered 2017-08-18 – 2017-08-20 (×4): 30 mL via ORAL
  Filled 2017-08-17 (×4): qty 30

## 2017-08-17 MED ORDER — POLYETHYLENE GLYCOL 3350 17 GM/SCOOP PO POWD: 1.0000 | Freq: Once | ORAL | Status: DC

## 2017-08-17 MED ORDER — ALBUMIN HUMAN 5 % IV SOLN
INTRAVENOUS | Status: AC
  Filled 2017-08-17: qty 250

## 2017-08-17 MED ORDER — BUPIVACAINE-EPINEPHRINE 0.25% -1:200000 IJ SOLN
INTRAMUSCULAR | Status: DC | PRN
  Administered 2017-08-17: 30 mL

## 2017-08-17 MED ORDER — SPOT INK MARKER SYRINGE KIT
PACK | SUBMUCOSAL | Status: AC
  Filled 2017-08-17: qty 5

## 2017-08-17 MED ORDER — SODIUM CHLORIDE 0.9 % IJ SOLN
INTRAMUSCULAR | Status: AC
  Filled 2017-08-17: qty 50

## 2017-08-17 MED ORDER — PROMETHAZINE HCL 25 MG/ML IJ SOLN: 6.2500 mg | INTRAMUSCULAR | Status: DC | PRN

## 2017-08-17 MED ORDER — FENTANYL CITRATE (PF) 250 MCG/5ML IJ SOLN
INTRAMUSCULAR | Status: AC
  Filled 2017-08-17: qty 5

## 2017-08-17 MED ORDER — TRAMADOL HCL 50 MG PO TABS
50.0000 mg | ORAL_TABLET | Freq: Four times a day (QID) | ORAL | Status: DC | PRN
  Administered 2017-08-18 – 2017-08-25 (×4): 50 mg via ORAL
  Filled 2017-08-17 (×5): qty 1

## 2017-08-17 MED ORDER — LIDOCAINE 2% (20 MG/ML) 5 ML SYRINGE
INTRAMUSCULAR | Status: DC | PRN
  Administered 2017-08-17: 1 mg/kg/h via INTRAVENOUS

## 2017-08-17 MED ORDER — DIPHENHYDRAMINE HCL 12.5 MG/5ML PO ELIX: 12.5000 mg | ORAL_SOLUTION | Freq: Four times a day (QID) | ORAL | Status: DC | PRN

## 2017-08-17 MED ORDER — DEXAMETHASONE SODIUM PHOSPHATE 10 MG/ML IJ SOLN
INTRAMUSCULAR | Status: DC | PRN
  Administered 2017-08-17: 4 mg via INTRAVENOUS

## 2017-08-17 MED ORDER — SUGAMMADEX SODIUM 500 MG/5ML IV SOLN
INTRAVENOUS | Status: AC
  Filled 2017-08-17: qty 5

## 2017-08-17 MED ORDER — DIPHENHYDRAMINE HCL 50 MG/ML IJ SOLN: 12.5000 mg | Freq: Four times a day (QID) | INTRAMUSCULAR | Status: DC | PRN

## 2017-08-17 MED ORDER — NEOMYCIN SULFATE 500 MG PO TABS: 1000.0000 mg | ORAL_TABLET | ORAL | Status: DC

## 2017-08-17 MED ORDER — LIDOCAINE 2% (20 MG/ML) 5 ML SYRINGE
INTRAMUSCULAR | Status: AC
  Filled 2017-08-17: qty 5

## 2017-08-17 MED ORDER — ONDANSETRON HCL 4 MG PO TABS: 4.0000 mg | ORAL_TABLET | Freq: Four times a day (QID) | ORAL | Status: DC | PRN

## 2017-08-17 MED ORDER — LACTATED RINGERS IV SOLN
INTRAVENOUS | Status: DC
  Administered 2017-08-17 – 2017-08-18 (×2): via INTRAVENOUS

## 2017-08-17 MED ORDER — LACTATED RINGERS IR SOLN
Status: DC | PRN
  Administered 2017-08-17: 1000 mL

## 2017-08-17 MED ORDER — FENTANYL CITRATE (PF) 100 MCG/2ML IJ SOLN
INTRAMUSCULAR | Status: DC | PRN
  Administered 2017-08-17 (×5): 50 ug via INTRAVENOUS

## 2017-08-17 MED ORDER — FENTANYL CITRATE (PF) 100 MCG/2ML IJ SOLN
INTRAMUSCULAR | Status: AC
  Administered 2017-08-17: 25 ug via INTRAVENOUS
  Filled 2017-08-17: qty 2

## 2017-08-17 MED ORDER — SIMVASTATIN 10 MG PO TABS
10.0000 mg | ORAL_TABLET | Freq: Every evening | ORAL | Status: DC
  Administered 2017-08-17 – 2017-08-25 (×9): 10 mg via ORAL
  Filled 2017-08-17 (×10): qty 1

## 2017-08-17 MED ORDER — HEPARIN SODIUM (PORCINE) 5000 UNIT/ML IJ SOLN
5000.0000 [IU] | Freq: Three times a day (TID) | INTRAMUSCULAR | Status: DC
  Administered 2017-08-17 – 2017-08-19 (×5): 5000 [IU] via SUBCUTANEOUS
  Filled 2017-08-17 (×5): qty 1

## 2017-08-17 MED ORDER — PROPOFOL 10 MG/ML IV BOLUS
INTRAVENOUS | Status: DC | PRN
  Administered 2017-08-17: 230 mg via INTRAVENOUS

## 2017-08-17 MED ORDER — SUCCINYLCHOLINE CHLORIDE 200 MG/10ML IV SOSY
PREFILLED_SYRINGE | INTRAVENOUS | Status: DC | PRN
  Administered 2017-08-17: 120 mg via INTRAVENOUS

## 2017-08-17 MED ORDER — AMLODIPINE BESYLATE 10 MG PO TABS
10.0000 mg | ORAL_TABLET | Freq: Every day | ORAL | Status: DC
  Administered 2017-08-17 – 2017-08-20 (×4): 10 mg via ORAL
  Filled 2017-08-17 (×4): qty 1

## 2017-08-17 MED ORDER — INSULIN ASPART 100 UNIT/ML ~~LOC~~ SOLN
SUBCUTANEOUS | Status: AC
  Administered 2017-08-17: 8 [IU] via SUBCUTANEOUS
  Filled 2017-08-17: qty 1

## 2017-08-17 MED ORDER — ONDANSETRON HCL 4 MG/2ML IJ SOLN: 4.0000 mg | Freq: Four times a day (QID) | INTRAMUSCULAR | Status: DC | PRN

## 2017-08-17 MED ORDER — ROCURONIUM BROMIDE 10 MG/ML (PF) SYRINGE
PREFILLED_SYRINGE | INTRAVENOUS | Status: AC
Start: 2017-08-17 — End: ?
  Filled 2017-08-17: qty 10

## 2017-08-17 SURGICAL SUPPLY — 64 items
ADH SKN CLS APL DERMABOND .7 (GAUZE/BANDAGES/DRESSINGS) ×1
APPLIER CLIP ROT 10 11.4 M/L (STAPLE)
APR CLP MED LRG 11.4X10 (STAPLE)
BLADE CLIPPER SURG (BLADE) ×2 IMPLANT
CABLE HIGH FREQUENCY MONO STRZ (ELECTRODE) ×6 IMPLANT
CELLS DAT CNTRL 66122 CELL SVR (MISCELLANEOUS) IMPLANT
CHLORAPREP W/TINT 26ML (MISCELLANEOUS) ×3 IMPLANT
CLIP APPLIE ROT 10 11.4 M/L (STAPLE) IMPLANT
DECANTER SPIKE VIAL GLASS SM (MISCELLANEOUS) ×3 IMPLANT
DERMABOND ADVANCED (GAUZE/BANDAGES/DRESSINGS) ×2
DERMABOND ADVANCED .7 DNX12 (GAUZE/BANDAGES/DRESSINGS) IMPLANT
DISSECTOR BLUNT TIP ENDO 5MM (MISCELLANEOUS) IMPLANT
DRAPE SHEET LG 3/4 BI-LAMINATE (DRAPES) ×4 IMPLANT
DRSG OPSITE POSTOP 4X6 (GAUZE/BANDAGES/DRESSINGS) ×2 IMPLANT
ELECT REM PT RETURN 15FT ADLT (MISCELLANEOUS) ×3 IMPLANT
GAUZE SPONGE 4X4 12PLY STRL (GAUZE/BANDAGES/DRESSINGS) ×1 IMPLANT
GLOVE BIO SURGEON STRL SZ7.5 (GLOVE) ×26 IMPLANT
GLOVE ECLIPSE 8.0 STRL XLNG CF (GLOVE) ×6 IMPLANT
GOWN STRL REUS W/TWL XL LVL3 (GOWN DISPOSABLE) ×24 IMPLANT
GRASPER SUT TROCAR 14GX15 (MISCELLANEOUS) ×2 IMPLANT
LIGASURE IMPACT 36 18CM CVD LR (INSTRUMENTS) IMPLANT
NS IRRIG 1000ML POUR BTL (IV SOLUTION) ×3 IMPLANT
PACK COLON (CUSTOM PROCEDURE TRAY) ×3 IMPLANT
PAD POSITIONING PINK XL (MISCELLANEOUS) ×2 IMPLANT
PORT LAP GEL ALEXIS MED 5-9CM (MISCELLANEOUS) ×2 IMPLANT
POSITIONER SURGICAL ARM (MISCELLANEOUS) ×4 IMPLANT
RELOAD PROXIMATE 75MM BLUE (ENDOMECHANICALS) ×6 IMPLANT
RELOAD STAPLE 75 3.8 BLU REG (ENDOMECHANICALS) IMPLANT
RETRACTOR WND ALEXIS 18 MED (MISCELLANEOUS) IMPLANT
RTRCTR WOUND ALEXIS 18CM MED (MISCELLANEOUS)
SCISSORS LAP 5X35 DISP (ENDOMECHANICALS) ×3 IMPLANT
SEALER TISSUE G2 STRG ARTC 35C (ENDOMECHANICALS) ×2 IMPLANT
SET IRRIG TUBING LAPAROSCOPIC (IRRIGATION / IRRIGATOR) ×2 IMPLANT
SHEARS HARMONIC ACE PLUS 36CM (ENDOMECHANICALS) IMPLANT
SLEEVE ADV FIXATION 5X100MM (TROCAR) ×4 IMPLANT
SLEEVE ENDOPATH XCEL 5M (ENDOMECHANICALS) ×1 IMPLANT
STAPLER 90 3.5 STAND SLIM (STAPLE) ×3
STAPLER 90 3.5 STD SLIM (STAPLE) IMPLANT
STAPLER PROXIMATE 75MM BLUE (STAPLE) ×2 IMPLANT
STAPLER VISISTAT 35W (STAPLE) ×1 IMPLANT
SUT MNCRL AB 4-0 PS2 18 (SUTURE) ×3 IMPLANT
SUT PDS AB 1 CT1 27 (SUTURE) ×8 IMPLANT
SUT PROLENE 2 0 CT2 30 (SUTURE) IMPLANT
SUT PROLENE 2 0 KS (SUTURE) IMPLANT
SUT SILK 2 0 (SUTURE)
SUT SILK 2 0 SH CR/8 (SUTURE) ×3 IMPLANT
SUT SILK 2-0 18XBRD TIE 12 (SUTURE) ×1 IMPLANT
SUT SILK 3 0 (SUTURE)
SUT SILK 3 0 SH CR/8 (SUTURE) ×3 IMPLANT
SUT SILK 3-0 18XBRD TIE 12 (SUTURE) ×1 IMPLANT
SUT VICRYL 0 UR6 27IN ABS (SUTURE) ×4 IMPLANT
SYS LAPSCP GELPORT 120MM (MISCELLANEOUS)
SYSTEM LAPSCP GELPORT 120MM (MISCELLANEOUS) IMPLANT
TAPE CLOTH 4X10 WHT NS (GAUZE/BANDAGES/DRESSINGS) ×2 IMPLANT
TOWEL OR 17X26 10 PK STRL BLUE (TOWEL DISPOSABLE) ×3 IMPLANT
TRAY FOLEY MTR SLVR 14FR STAT (SET/KITS/TRAYS/PACK) ×1 IMPLANT
TRAY FOLEY MTR SLVR 16FR STAT (SET/KITS/TRAYS/PACK) ×2 IMPLANT
TROCAR ADV FIXATION 5X100MM (TROCAR) ×3 IMPLANT
TROCAR BALLN 12MMX100 BLUNT (TROCAR) ×3 IMPLANT
TROCAR XCEL NON-BLD 11X100MML (ENDOMECHANICALS) IMPLANT
TUBING CONNECTING 10 (TUBING) ×4 IMPLANT
TUBING CONNECTING 10' (TUBING) ×2
TUBING INSUF HEATED (TUBING) ×3 IMPLANT
YANKAUER SUCT BULB TIP NO VENT (SUCTIONS) ×3 IMPLANT

## 2017-08-17 NOTE — Progress Notes (Signed)
Inpatient Diabetes Program Recommendations  AACE/ADA: New Consensus Statement on Inpatient Glycemic Control (2015)  Target Ranges:  Prepandial:   less than 140 mg/dL      Peak postprandial:   less than 180 mg/dL (1-2 hours)      Critically ill patients:  140 - 180 mg/dL   Lab Results  Component Value Date   GLUCAP 271 (H) 08/17/2017   HGBA1C 8.8 (H) 08/10/2017    Review of Glycemic Control  Diabetes history: DM 2 Outpatient Diabetes medications: Glipizide 10 mg BID Current orders for Inpatient glycemic control: Glipizide 10 mg BID, Novolog 0-15 units tid  Inpatient Diabetes Program Recommendations:    A1c 8.8% on 8/8  Consult received. Post op glucose 271 mg/dl. Patient received first dose of correction insulin. Patient received Decadron 4 mg during surgery. Renal function normal. Clear liquid diet ordered. Agree with current regimen. Will watch trends this evening and see what fasting glucose looks like.   Thanks,  Tama Headings RN, MSN, BC-ADM Inpatient Diabetes Coordinator Team Pager [phone] (8a-5p)

## 2017-08-17 NOTE — Op Note (Addendum)
PATIENT: Shane Bean  62 y.o. male  Patient Care Team: Janyth Pupa, NP as PCP - General (Internal Medicine)  PREOP DIAGNOSIS: ENDOSCOPICALLY UNRESECTABLE COLON POLYP  POSTOP DIAGNOSIS:  1. Endoscopically unresectable polyp of cecum 2. Umbilical hernia  PROCEDURE:  1. Laparoscopic right hemicolectomy 2. Colonoscopy 3. Bilateral transversus abdominis plane (TAP) block 4. Umbilical hernia repair  SURGEON: Sharon Mt. Dema Severin, MD  ASSISTANT: Michael Boston, MD  ANESTHESIA: General endotracheal  EBL: 50cc Total I/O In: 1250 [I.V.:1000; IV Piggyback:250] Out: 225 [Urine:175; Blood:50]  DRAINS: None  SPECIMEN: 1. Right colon + appendix + terminal ileum as one unit 2. Umbilical nodule  COUNTS: Sponge, needle and instrument counts were reported correct x2  FINDINGS: Colonoscopy demonstrated polypoid lesion to be in cecum. Right hemicolectomy performed with stapled side to side functional end to end ileocolic anastomosis. Umbilical hernia which contained omentum and presumably infarcted fat had to be reduced to perform the colectomy. Given this, the umbilical hernia was repaired in an open fashion without mesh. The hernia defect was approximately 2 x 2cm in size.  STATEMENT OF MEDICAL NECESSITY: Mr. Quast is a very pleasant 47yoM with hx of HTN, DM, HLD, aortic valve s/p replacement (on warfarin), aortic dissection whom underwent screening colonoscopy 04/04/17 as well as EGD. On colonoscopy he had a 3 mm polyp in the sigmoid as well as descending colon which were both removed and came back HP in sigmoid and TA in descending; a large nonobstructing mass was found in the cecum which was non-circumferential and biopsied-returned tubular adenoma; pan colonic diverticulosis. The cecal polyp was not felt to be endoscopically resectable. On EGD was found to have H. pylori and is currently on treatment for this. He saw Dr.Jada at [facility] preoperatively for cardiac  clearance given history and dissection. Options were discussed moving forward and he opted to undergo surgical resection of the right colon to address this polyp. Please refer to H&P for details regarding this discussion.  NARRATIVE:  The patient was identified & brought into the operating room, placed supine on the operating table and SCDs were applied to the lower extremities. General endotracheal anesthesia was induced. The patient was positioned in lithotomy using Allen stirrups with left arm tucked. Pressure points were padded and then verified. Antibiotics were administered. A foley catheter was placed under sterile conditions. The abdomen was prepped and draped in a sterile fashion. A timeout was performed confirming our patient and plan.   A supraumbilical midline incision was created at the location of the planned extraction site.  This was carried down to the level of the fascia.  Fascia was incised sharply.  The fascia was then grasped elevated and the peritoneum gently entered bluntly.  The wound was opened to accommodate a small Alexis wound protector.  The wound protector was placed and a yellow Placed on top.  A port was placed through the.  The abdomen is insufflated with CO2 to a pressure of 15 mmHg.  Inspection revealed a umbilical hernia which contained omentum and was in one location chronically incarcerated with associated nodule.  Attention was turned to performing bilateral transversus abdominis plane blocks.  This was done using a dilute mixture of 0.25% Marcaine with epinephrine and Exparel.  Following this, 3 additional 5 mm trochars were placed.  Two were placed in the left hemiabdomen and one in the right under direct visualization.  The patient was then positioned in Trendelenburg with right side up.  Attention was turned in the appendix and  cecum.  This was found and traced back to the terminal ileum.  The terminal ileum was then clamped with a bowel clamp.  Attention was then  turned to performing the colonoscopy to ensure location of the polyp as it has not been tattooed preoperatively.  I then went below and passed the colonoscope under direct visualization via the anus all the way through the colon into the cecum.  The polyp was identified in the cecum just proximal to the ileocecal valve.  The scope was then withdrawn under direct visualization and the colon decompressed on the way out.  Of note, there was pandiverticulosis noted but no other remarkable findings. The visualization was poor for visualizing polyps <1cm in size.  I then rescrubbed and changed gowns/gloves. The terminal ileal clamp was released.  The omentum containing hernia at the umbilicus was reduced and noted to be hemostatic.  There was a nodule at this level presumably prior infarct and omentum which was removed and passed off the specimen with extraction through the midline Sherrill port.  The cecum and terminal ileum were then elevated anteriorly and the ileocolic pedicle was identified. Gentle blunt dissection commenced around the pedicle after incising the overlying peritoneum. The pedicle was dissected circumferentially and the duodenum was identified lateral to the dissection and kept "down" for the dissection. The ileocolic pedicle at this point had been circumferentially dissected and the duodenum was well away from it.  The ileocolic pedicle was then ligated using the Enseal device.  The pedicle was inspected and noted to be hemostatic.  The plane was developed working cephalad.  This was carried up to the level of the hepatic flexure.  Following this, attention was turned to performing the lateral dissection and mobilization of the descending colon.  The patient was repositioned in reverse Trendelenburg with right side up.  The Shaylene Paganelli line of Toldt was then incised up to the level of the hepatic flexure and the colon was completely mobilized medially.  The retroperitoneal attachments of the terminal  ileum were also freed along the same plane working along the Jannell Franta line of Toldt.  Care was taken to preserve and protect the retroperitoneal structures.  The transverse colon was retracted caudad and the omentum was elevated anteriorly.  The omentum was entered just above the mid transverse colon and the lesser sac was then visualized.  The omentum was taken off the colon out to the level of the hepatic flexure.  Attachments of the hepatic flexure were freed from the hepatic flexure mesocolon.  At this point the entire right colon mobile and reached beyond the midline.  The cecum was grasped and noted to be able to be flipped into the left upper quadrant. The entire colon was then flipped medially and mobilized off of the retroperitoneal structures until the lateral edge of the duodenum was visualized underneath.  The abdomen was then desufflated and the [place] wound protector was removed. The terminal ileum and right colon were then removed from the extraction port. The ligament of Johnella Moloney was taken down. The terminal ileum was transected using a GIA blue load stapler after creating a window in the mesentery at this location. The distal point of transection was identified on the mid transverse colon at the level of the middle colic vessels.  Preserving the main branch of the middle colic, a window was created in the mesentery at this level.  The intervening mesentery between the cecum in this location were then ligated using the Enseal device. The transverse  colon was then transected using another blue load GIA stapler.  The staple lines were inspected and hemostasis was achieved with 3-0 silk U stitches.  The antimesenteric corners of the respective staple line were trimmed. The bowel orientation confirmed such that there was no twisting of either limb or mesentery. No small bowel was inside the mesenteric defect either.  The anastomosis was then created between the distal ileum and transverse colon using a 75  mm GIA blue load stapler.  Both sides were free of any intervening structures and mesentery.  The stapler was fired and the staple line inspected and noted to be hemostatic.  The common enterotomy channel was closed using a TA 90 blue load stapler.  Staple line oozers were controlled with 3-0 silk U stitches.  The corner of the staple lines were then dunked using 3-0 silk sutures.  A 3-0 silk suture was used to create a crotch stitch. The anastomosis was palpated and noted to be pink, widely patent and now hemostatic. Of note, the middle colic supplied the transverse colon at this level and there was a palpable pulse in the associated mesentery on both sides. This was then placed back into the abdomen. The omentum was then brought down over the anastomosis. The Shiquita Collignon [place] wound protector was removed. I opened the specimen on the back table to confirm findings. At this point, we switched to clean instruments, gowns and drapes.   The umbilical hernia was then addressed. Making a supraumbilical transverse incision, the skin was incised and dissection down to fascia carried out. The distance between skin and fascia was close enough that this could not be carried out laparoscopically with a PMI. The fascia was circumferentially freed from overlying subcutaneous tissue. The defect measured approximately 2 x 2 cm. The closure was under the least amount of tension when fascia approximated transversely. Therefore this closure technique was selected. The fascia was closed primarily using #1 PDS suture. A part of the umbilical skin was then excised to facilitate a cosmetically appealing closure. The fascia was then palpated and noted to be completely closed. All counts were reported correct x2.  The supraumbilical extraction site fascia was then closed using two running #1 non-looped suture. The fascia was palpated and noted to be completely closed with no gaps. The skin of all incision sites was approximated with staples and  subsequently covered with sterile dressings. The patient was then awakened from anesthesia, taken out of the lithotomy position, extubated and transferred to a stretcher for transport to PACU in satisfactory condition.  DISPOSITION: PACU in satisfactory condition

## 2017-08-17 NOTE — Transfer of Care (Signed)
Immediate Anesthesia Transfer of Care Note  Patient: Shane Bean  Procedure(s) Performed: LAPAROSCOPIC  RIGHT HEMI COLECTOMY, COLONOSCOPY, UMBILICAL HERNIA REPAIR (Right )  Patient Location: PACU  Anesthesia Type:General  Level of Consciousness: drowsy and patient cooperative  Airway & Oxygen Therapy: Patient Spontanous Breathing and Patient connected to face mask oxygen  Post-op Assessment: Report given to RN and Post -op Vital signs reviewed and stable  Post vital signs: Reviewed and stable  Last Vitals:  Vitals Value Taken Time  BP 127/82 08/17/2017 12:40 PM  Temp 37.5 C 08/17/2017 12:40 PM  Pulse 75 08/17/2017 12:45 PM  Resp 16 08/17/2017 12:45 PM  SpO2 100 % 08/17/2017 12:45 PM  Vitals shown include unvalidated device data.  Last Pain:  Vitals:   08/17/17 0822  TempSrc:   PainSc: 0-No pain         Complications: No apparent anesthesia complications

## 2017-08-17 NOTE — H&P (Signed)
CC: Here today for surgery - endoscopically unresectable polyp  HPI: Shane Bean is a very pleasant 52yoM with hx of HTN, DM, HLD, aortic valve s/p replacement (on warfarin), aortic dissection here today for follow-up from Dr. Allen Norris. He underwent screening colonoscopy 04/04/17 as well as EGD. On colonoscopy he had a 3 mm polyp in the sigmoid as well as descending colon which were both removed and came back HP in sigmoid and TA in descending; a large nonobstructing mass was found in the cecum which was non-circumferential and biopsied-returned tubular adenoma; pan colonic diverticulosis. The cecal polyp was not felt to be endoscopically resectable. On EGD was found to have H. pylori and is currently on treatment for this. He denies any complaints today. He denies any abdominal pain, nausea, vomiting, blood in stool or melena. He sees Dr[facility]Jada at [facility] whom he says has been managing his chronic aortic dissection with blood pressure control and also managing his anticoagulation  PMH: HTN, DM, HLD, aortic valve s/p replacement (on warfarin), aortic dissection  PSH: Left inguinal hernia repair open; denies any other prior abdominal procedures.  FHx: Denies FHx of malignancy  Social: Denies use of tobacco/EtOH/drugs  ROS: A comprehensive 10 system review of systems was completed with the patient and pertinent findings as noted above  Past Medical History:  Diagnosis Date  . Ascending aortic dissection ([place])   . Diabetes mellitus without complication ([place])   . DVT (deep vein thrombosis) in pregnancy (H[facility])    legs and lungs   . Hyperlipemia   . Hypertension   . Kidney stones     Past Surgical History:  Procedure Laterality Date  . CARDIAC SURGERY    . COLONOSCOPY WITH PROPOFOL N/A 04/04/2017   Procedure: COLONOSCOPY WITH PROPOFOL;  Surgeon: Lucilla Lame, MD;  Location: [facility] ENDOSCOPY;  Service: Endoscopy;  Laterality: N/A;  . DENTAL SURGERY    . ESOPHAGOGASTRODUODENOSCOPY (EGD)  WITH PROPOFOL N/A 04/04/2017   Procedure: ESOPHAGOGASTRODUODENOSCOPY (EGD) WITH PROPOFOL;  Surgeon: Lucilla Lame, MD;  Location: [facility] ENDOSCOPY;  Service: Endoscopy;  Laterality: N/A;  . REPAIR THORACIC AORTA     Social:  reports that he has never smoked. He has never used smokeless tobacco. He reports that he drinks about 28.0 standard drinks of alcohol per week. He reports that he does not use drugs.  Allergies:  Allergies  Allergen Reactions  . Chlorthalidone Swelling    Testicular/Scrotum edema  . Hydralazine Other (See Comments)    GI Upset (intolerance)  . Lisinopril Swelling    Facial Edema (intolerance)    Medications: I have reviewed the patient's current medications.  Results for orders placed or performed during the hospital encounter of 08/17/17 (from the past 48 hour(s))  Glucose, capillary     Status: Abnormal   Collection Time: 08/17/17  7:42 AM  Result Value Ref Range   Glucose-Capillary 249 (H) 70 - 99 mg/dL   Comment 1 Notify RN    Comment 2 Document in Chart     No results found.  ROS - all of the below systems have been reviewed with the patient and positives are indicated with bold text General: chills, fever or night sweats Eyes: blurry vision or double vision ENT: epistaxis or sore throat Allergy/Immunology: itchy/watery eyes or nasal congestion Hematologic/Lymphatic: bleeding problems, blood clots or swollen lymph nodes Endocrine: temperature intolerance or unexpected weight changes Breast: new or changing breast lumps or nipple discharge Resp: cough, shortness of breath, or wheezing CV: chest pain or dyspnea on exertion  GI: as per HPI GU: dysuria, trouble voiding, or hematuria MSK: joint pain or joint stiffness Neuro: TIA or stroke symptoms Derm: pruritus and skin lesion changes Psych: anxiety and depression  PE Blood pressure (!) 170/105, pulse 80, temperature 98.8 F (37.1 C), temperature source Oral, resp. rate 20, SpO2 99 %. Constitutional:  NAD; conversant; no deformities Eyes: Moist conjunctiva; no lid lag; anicteric; PERRL Neck: Trachea midline; no thyromegaly Lungs: Normal respiratory effort; no tactile fremitus CV: RRR; no palpable thrills; no pitting edema GI: Abd obese, soft, NT/ND; no palpable hepatosplenomegaly MSK: Normal gait; no clubbing/cyanosis Psychiatric: Appropriate affect; alert and oriented x3 Lymphatic: No palpable cervical or axillary lymphadenopathy  Results for orders placed or performed during the hospital encounter of 08/17/17 (from the past 48 hour(s))  Glucose, capillary     Status: Abnormal   Collection Time: 08/17/17  7:42 AM  Result Value Ref Range   Glucose-Capillary 249 (H) 70 - 99 mg/dL   Comment 1 Notify RN    Comment 2 Document in Chart      A/P: Shane Bean is a very pleasant 80yoM with HTN, DM, HLD, aortic valve s/p replacement (on warfarin), aortic dissection - here today for surgery for endoscopically unresectable polyp in the cecum although not tattoo'd  -Last dose of Lovenox was yesterday AM by his report;  Last dose of warfarin Saturday, 08/12/17 -We discussed again today the anatomy physiology of the GI tract at length with associated pictures and pathophysiology of colon polyps. We discussed the potential for the polyp to harbor or develop into colon cancer. -Will plan on table colonoscopy for localization to ensure in cecum and likely laparoscopic vs open right hemicolectomy; all other indicated procedures.  -The planned procedure, material risks (including, but not limited to, pain, bleeding, infection, scarring, need for blood transfusion, damage to surrounding structures- blood vessels/nerves/viscus/organs, damage to ureter, urine leak, leak from anastomosis, need for additional procedures, need for stoma which may be permanent, hernia, worsening of pre-existing medical conditions, recurrence, pneumonia, heart attack, stroke, death) benefits and alternatives to surgery were  discussed at length. The patient's questions were answered to his satisfaction, he voiced understanding and elected to proceed with surgery  Sharon Mt. Dema Severin, M.D. General and Colorectal Surgery [facility]

## 2017-08-17 NOTE — Anesthesia Procedure Notes (Signed)

## 2017-08-18 ENCOUNTER — Encounter (HOSPITAL_COMMUNITY): Payer: Self-pay | Admitting: Surgery

## 2017-08-18 LAB — PHOSPHORUS: PHOSPHORUS: 3.2 mg/dL (ref 2.5–4.6)

## 2017-08-18 LAB — GLUCOSE, CAPILLARY
GLUCOSE-CAPILLARY: 162 mg/dL — AB (ref 70–99)
GLUCOSE-CAPILLARY: 183 mg/dL — AB (ref 70–99)
GLUCOSE-CAPILLARY: 200 mg/dL — AB (ref 70–99)
Glucose-Capillary: 203 mg/dL — ABNORMAL HIGH (ref 70–99)

## 2017-08-18 LAB — BASIC METABOLIC PANEL
ANION GAP: 7 (ref 5–15)
BUN: 9 mg/dL (ref 8–23)
CHLORIDE: 106 mmol/L (ref 98–111)
CO2: 26 mmol/L (ref 22–32)
Calcium: 9.3 mg/dL (ref 8.9–10.3)
Creatinine, Ser: 1.06 mg/dL (ref 0.61–1.24)
GFR calc Af Amer: >60 mL/min (ref >60)
GFR calc non Af Amer: >60 mL/min (ref >60)
Glucose, Bld: 155 mg/dL — ABNORMAL HIGH (ref 70–99)
Potassium: 3.7 mmol/L (ref 3.5–5.1)
SODIUM: 139 mmol/L (ref 135–145)

## 2017-08-18 LAB — CBC
HEMATOCRIT: 35.2 % — AB (ref 39.0–52.0)
HEMOGLOBIN: 11.2 g/dL — AB (ref 13.0–17.0)
MCH: 25.1 pg — ABNORMAL LOW (ref 26.0–34.0)
MCHC: 31.8 g/dL (ref 30.0–36.0)
MCV: 78.7 fL (ref 78.0–100.0)
Platelets: 170 10*3/uL (ref 150–400)
RBC: 4.47 MIL/uL (ref 4.22–5.81)
RDW: 16.4 % — ABNORMAL HIGH (ref 11.5–15.5)
WBC: 9.4 10*3/uL (ref 4.0–10.5)

## 2017-08-18 LAB — MAGNESIUM: MAGNESIUM: 1.7 mg/dL (ref 1.7–2.4)

## 2017-08-18 MED ORDER — IBUPROFEN 200 MG PO TABS
600.0000 mg | ORAL_TABLET | Freq: Four times a day (QID) | ORAL | Status: DC | PRN
  Administered 2017-08-18 – 2017-08-19 (×2): 600 mg via ORAL
  Filled 2017-08-18 (×2): qty 3

## 2017-08-18 NOTE — Anesthesia Postprocedure Evaluation (Signed)
Anesthesia Post Note  Patient: [name]  Procedure(s) Performed: LAPAROSCOPIC  RIGHT HEMI COLECTOMY, COLONOSCOPY, UMBILICAL HERNIA REPAIR (Right )     Patient location during evaluation: PACU Anesthesia Type: General Level of consciousness: awake and alert Pain management: pain level controlled Vital Signs Assessment: post-procedure vital signs reviewed and stable Respiratory status: spontaneous breathing, nonlabored ventilation and respiratory function stable Cardiovascular status: blood pressure returned to baseline and stable Postop Assessment: no apparent nausea or vomiting Anesthetic complications: no    Last Vitals:  Vitals:   08/18/17 0141 08/18/17 0433  BP: 132/79 130/79  Pulse: 70 75  Resp: 18 18  Temp: 37.2 C 36.8 C  SpO2: 94% 95%    Last Pain:  Vitals:   08/18/17 0523  TempSrc:   PainSc: 0-No pain                 Lynda Rainwater

## 2017-08-18 NOTE — Progress Notes (Signed)
Subjective No acute events. Feeling well today. Mild soreness in bilateral lower quadrants. No n/v, tolerating liquids without issue. Had 2 small bowel movements last night and passed gas once or twice. Ambulating.  Objective: Vital signs in last 24 hours: Temp:  [98.3 F (36.8 C)-99.5 F (37.5 C)] 98.3 F (36.8 C) (08/16 0433) Pulse Rate:  [66-78] 75 (08/16 0433) Resp:  [15-25] 18 (08/16 0433) BP: (121-153)/(72-93) 130/79 (08/16 0433) SpO2:  [94 %-100 %] 95 % (08/16 0433) Weight:  [129 kg-129.5 kg] 129.5 kg (08/16 0528)    Intake/Output from previous day: 08/15 0701 - 08/16 0700 In: 3244.9 [P.O.:400; I.V.:2594.9; IV Piggyback:250] Out: 3046 [Urine:2995; Stool:1; Blood:50] Intake/Output this shift: No intake/output data recorded.  Gen: NAD, comfortable CV: RRR Pulm: Normal work of breathing Abd: Obese, soft, NT; no significantly distended relative to preop - large abdomen at baseline Ext: SCDs in place  Lab Results: CBC  Recent Labs    08/18/17 0437  WBC 9.4  HGB 11.2*  HCT 35.2*  PLT 170   BMET Recent Labs    08/18/17 0437  NA 139  K 3.7  CL 106  CO2 26  GLUCOSE 155*  BUN 9  CREATININE 1.06  CALCIUM 9.3   PT/INR Recent Labs    08/17/17 0819  LABPROT 16.4*  INR 1.33   ABG No results for input(s): PHART, HCO3 in the last 72 hours.  Invalid input(s): PCO2, PO2  Studies/Results:  Anti-infectives: Anti-infectives (From admission, onward)   Start     Dose/Rate Route Frequency Ordered Stop   08/17/17 1400  neomycin (MYCIFRADIN) tablet 1,000 mg  Status:  Discontinued     1,000 mg Oral 3 times per day 08/17/17 0752 08/17/17 0802   08/17/17 1400  metroNIDAZOLE (FLAGYL) tablet 1,000 mg  Status:  Discontinued     1,000 mg Oral 3 times per day 08/17/17 0752 08/17/17 0802   08/17/17 0800  cefoTEtan (CEFOTAN) 2 g in sodium chloride 0.9 % 100 mL IVPB     2 g 200 mL/hr over 30 Minutes Intravenous On call to O.R. 08/17/17 0752 08/17/17 0949        Assessment/Plan: Patient Active Problem List   Diagnosis Date Noted  . Colon polyp 08/17/2017  . Anemia   . Polyp of sigmoid colon   . Benign neoplasm of descending colon   . Colon neoplasm   . Abdominal pain, epigastric   . Gastritis without bleeding   . Nephrolithiasis 11/06/2015  . Cough with sputum 01/14/2015  . Fatigue 01/14/2015  . Obesity 01/14/2015  . GERD (gastroesophageal reflux disease) 04/24/2014  . Long term current use of anticoagulant therapy 04/24/2014  . Type 2 diabetes mellitus with complication ([place]) 94/85/4627  . Ascending aortic dissection ([place]) 10/04/2013  . HTN (hypertension) 05/28/2013  . Pulmonary emboli ([place]) 06/26/2012  . Pulmonary edema 06/23/2012  . Respiratory failure with hypoxia ([place]) 06/23/2012  . Osteoarthritis of left hip 04/19/2012  . Right leg DVT ([place]) 06/17/2011   s/p Procedure(s): LAPAROSCOPIC  RIGHT HEMICOLECTOMY, COLONOSCOPY, BILATERAL TAP BLOCKS, UMBILICAL HERNIA REPAIR 0/35/0093  -Ambulate 5x/day -Discontinue foley catheter -Full liquids - patient is a diabetic and should not have concentrated sweets -Continue sliding scale insulin - will transition back to glyburide once reliably tolerating diet -Continue Entereg today - 2 smears of BMs would not classify as actual BM yet. -Trend hemoglobin - if H&H stable after 48hrs (8/17), will consider stopping prophylactic heparin and starting him on a heparin drip without boluses and restarting home warfarin -PPx:  SQH, SCDs, H2B -Dispo: Pending toleration of diet, pain controlled on oral analgesics, having bowel fxn and back on therapeutic levels of warfarin.   LOS: 1 day   Sharon Mt. Dema Severin, M.D. General and Colorectal Surgery [facility]

## 2017-08-18 NOTE — Progress Notes (Signed)
Inpatient Diabetes Program Recommendations  AACE/ADA: New Consensus Statement on Inpatient Glycemic Control (2015)  Target Ranges:  Prepandial:   less than 140 mg/dL      Peak postprandial:   less than 180 mg/dL (1-2 hours)      Critically ill patients:  140 - 180 mg/dL   Review of Glycemic Control  Diabetes history: DM 2 Outpatient Diabetes medications: Glipizide 10 mg BID Current orders for Inpatient glycemic control: Glipizide 10 mg BID, Novolog 0-15 units tid  Inpatient Diabetes Program Recommendations:    A1c 8.8% on 8/8  Fasting glucose 162 mg/dl. Agree with current regimen.   Thanks,  Tama Headings RN, MSN, BC-ADM Inpatient Diabetes Coordinator Team Pager [phone] (8a-5p)

## 2017-08-19 LAB — BASIC METABOLIC PANEL
Anion gap: 5 (ref 5–15)
BUN: 10 mg/dL (ref 8–23)
CHLORIDE: 106 mmol/L (ref 98–111)
CO2: 27 mmol/L (ref 22–32)
CREATININE: 0.83 mg/dL (ref 0.61–1.24)
Calcium: 9.4 mg/dL (ref 8.9–10.3)
GFR calc Af Amer: >60 mL/min (ref >60)
GFR calc non Af Amer: >60 mL/min (ref >60)
GLUCOSE: 139 mg/dL — AB (ref 70–99)
Potassium: 3.7 mmol/L (ref 3.5–5.1)
SODIUM: 138 mmol/L (ref 135–145)

## 2017-08-19 LAB — CBC
HCT: 34.3 % — ABNORMAL LOW (ref 39.0–52.0)
HEMOGLOBIN: 10.7 g/dL — AB (ref 13.0–17.0)
MCH: 24.7 pg — AB (ref 26.0–34.0)
MCHC: 31.2 g/dL (ref 30.0–36.0)
MCV: 79.2 fL (ref 78.0–100.0)
Platelets: 166 10*3/uL (ref 150–400)
RBC: 4.33 MIL/uL (ref 4.22–5.81)
RDW: 16.6 % — ABNORMAL HIGH (ref 11.5–15.5)
WBC: 7.8 10*3/uL (ref 4.0–10.5)

## 2017-08-19 LAB — GLUCOSE, CAPILLARY
GLUCOSE-CAPILLARY: 181 mg/dL — AB (ref 70–99)
GLUCOSE-CAPILLARY: 204 mg/dL — AB (ref 70–99)
GLUCOSE-CAPILLARY: 159 mg/dL — AB (ref 70–99)
Glucose-Capillary: 164 mg/dL — ABNORMAL HIGH (ref 70–99)

## 2017-08-19 LAB — HEPARIN LEVEL (UNFRACTIONATED): HEPARIN UNFRACTIONATED: 0.15 [IU]/mL — AB (ref 0.30–0.70)

## 2017-08-19 LAB — PHOSPHORUS: Phosphorus: 3.5 mg/dL (ref 2.5–4.6)

## 2017-08-19 LAB — MAGNESIUM: MAGNESIUM: 1.9 mg/dL (ref 1.7–2.4)

## 2017-08-19 MED ORDER — WARFARIN - PHARMACIST DOSING INPATIENT
Freq: Every day | Status: DC
  Administered 2017-08-24: 17:00:00

## 2017-08-19 MED ORDER — WARFARIN SODIUM 5 MG PO TABS
5.0000 mg | ORAL_TABLET | Freq: Once | ORAL | Status: AC
  Administered 2017-08-19: 5 mg via ORAL
  Filled 2017-08-19: qty 1

## 2017-08-19 MED ORDER — HEPARIN (PORCINE) IN NACL 100-0.45 UNIT/ML-% IJ SOLN
1800.0000 [IU]/h | INTRAMUSCULAR | Status: DC
  Administered 2017-08-20: 1500 [IU]/h via INTRAVENOUS
  Administered 2017-08-20 – 2017-08-24 (×7): 1800 [IU]/h via INTRAVENOUS
  Filled 2017-08-19 (×10): qty 250

## 2017-08-19 MED ORDER — HEPARIN (PORCINE) IN NACL 100-0.45 UNIT/ML-% IJ SOLN
1200.0000 [IU]/h | INTRAMUSCULAR | Status: DC
  Administered 2017-08-19: 1200 [IU]/h via INTRAVENOUS
  Filled 2017-08-19 (×2): qty 250

## 2017-08-19 NOTE — Progress Notes (Signed)
Subjective No n/v.  No additional BMs, but some flatus.    Objective: Vital signs in last 24 hours: Temp:  [98.3 F (36.8 C)-98.6 F (37 C)] 98.6 F (37 C) (08/17 0557) Pulse Rate:  [61-73] 65 (08/17 0557) Resp:  [16-18] 18 (08/17 0557) BP: (115-143)/(64-89) 116/77 (08/17 0557) SpO2:  [94 %-97 %] 96 % (08/17 0557)    Intake/Output from previous day: 08/16 0701 - 08/17 0700 In: 1693.8 [P.O.:960; I.V.:733.8] Out: 1150 [Urine:1150] Intake/Output this shift: No intake/output data recorded.  Gen: NAD, comfortable Pulm: Normal work of breathing Abd: Obese, soft, NT; sl distended/protuberant. ? Any change from baseline.  Bruising abdominal wall LLQ Ext: SCDs in place  Lab Results: CBC  Recent Labs    08/18/17 0437 08/19/17 0440  WBC 9.4 7.8  HGB 11.2* 10.7*  HCT 35.2* 34.3*  PLT 170 166   BMET Recent Labs    08/18/17 0437 08/19/17 0440  NA 139 138  K 3.7 3.7  CL 106 106  CO2 26 27  GLUCOSE 155* 139*  BUN 9 10  CREATININE 1.06 0.83  CALCIUM 9.3 9.4   PT/INR Recent Labs    08/17/17 0819  LABPROT 16.4*  INR 1.33   ABG No results for input(s): PHART, HCO3 in the last 72 hours.  Invalid input(s): PCO2, PO2  Studies/Results:  Anti-infectives: Anti-infectives (From admission, onward)   Start     Dose/Rate Route Frequency Ordered Stop   08/17/17 1400  neomycin (MYCIFRADIN) tablet 1,000 mg  Status:  Discontinued     1,000 mg Oral 3 times per day 08/17/17 0752 08/17/17 0802   08/17/17 1400  metroNIDAZOLE (FLAGYL) tablet 1,000 mg  Status:  Discontinued     1,000 mg Oral 3 times per day 08/17/17 0752 08/17/17 0802   08/17/17 0800  cefoTEtan (CEFOTAN) 2 g in sodium chloride 0.9 % 100 mL IVPB     2 g 200 mL/hr over 30 Minutes Intravenous On call to O.R. 08/17/17 0752 08/17/17 0949       Assessment/Plan: Patient Active Problem List   Diagnosis Date Noted  . Colon polyp 08/17/2017  . Anemia   . Polyp of sigmoid colon   . Benign neoplasm of descending  colon   . Colon neoplasm   . Abdominal pain, epigastric   . Gastritis without bleeding   . Nephrolithiasis 11/06/2015  . Cough with sputum 01/14/2015  . Fatigue 01/14/2015  . Obesity 01/14/2015  . GERD (gastroesophageal reflux disease) 04/24/2014  . Long term current use of anticoagulant therapy 04/24/2014  . Type 2 diabetes mellitus with complication ([place]) 76/28/3151  . Ascending aortic dissection ([place]) 10/04/2013  . HTN (hypertension) 05/28/2013  . Pulmonary emboli ([place]) 06/26/2012  . Pulmonary edema 06/23/2012  . Respiratory failure with hypoxia ([place]) 06/23/2012  . Osteoarthritis of left hip 04/19/2012  . Right leg DVT ([place]) 06/17/2011   s/p Procedure(s): LAPAROSCOPIC  RIGHT HEMICOLECTOMY, COLONOSCOPY, BILATERAL TAP BLOCKS, UMBILICAL HERNIA REPAIR 7/61/6073  -Ambulate 5x/day -Full liquids -adv to soft diet. -Continue sliding scale insulin - will transition back to glyburide once reliably tolerating diet -Continue Entereg today - 2 smears of BMs would not classify as actual BM yet. Heparin gtt.   -PPx: SQH, SCDs, H2B -Dispo: Pending toleration of diet, pain controlled on oral analgesics, having bowel fxn and back on therapeutic levels of warfarin.   LOS: 2 days

## 2017-08-19 NOTE — Progress Notes (Signed)
[place] for Heparin/Warfarin Indication: History of DVT, aortic valve replacement  Allergies  Allergen Reactions  . Chlorthalidone Swelling    Testicular/Scrotum edema  . Hydralazine Other (See Comments)    GI Upset (intolerance)  . Lisinopril Swelling    Facial Edema (intolerance)    Patient Measurements: Height: 5\' 10"  (177.8 cm) Weight: 285 lb 6.4 oz (129.5 kg)(285.4 lb) IBW/kg (Calculated) : 73 Heparin Dosing Weight: 102.6  Vital Signs: Temp: 98.3 F (36.8 C) (08/17 1416) Temp Source: Oral (08/17 1416) BP: 110/60 (08/17 1416) Pulse Rate: 71 (08/17 1416)  Labs: Recent Labs    08/17/17 0819 08/18/17 0437 08/19/17 0440 08/19/17 1813  HGB  --  11.2* 10.7*  --   HCT  --  35.2* 34.3*  --   PLT  --  170 166  --   LABPROT 16.4*  --   --   --   INR 1.33  --   --   --   HEPARINUNFRC  --   --   --  0.15*  CREATININE  --  1.06 0.83  --    Estimated Creatinine Clearance: 126.4 mL/min (by C-G formula based on SCr of 0.83 mg/dL).  Medical History: Past Medical History:  Diagnosis Date  . Ascending aortic dissection ([place])   . Diabetes mellitus without complication ([place])   . DVT (deep vein thrombosis) in pregnancy (HCC)    legs and lungs   . Hyperlipemia   . Hypertension   . Kidney stones    Medications:  Scheduled:  . acetaminophen  1,000 mg Oral Q6H  . allopurinol  100 mg Oral Q1500  . alvimopan  12 mg Oral BID  . amLODipine  10 mg Oral QHS  . doxazosin  8 mg Oral QHS  . famotidine  10 mg Oral Daily  . glipiZIDE  10 mg Oral BID  . insulin aspart  0-15 Units Subcutaneous TID WC  . metoprolol tartrate  100 mg Oral BID  . saccharomyces boulardii  250 mg Oral BID  . simvastatin  10 mg Oral QPM  . spironolactone  25 mg Oral QHS  . Warfarin - Pharmacist Dosing Inpatient   Does not apply q1800   Infusions:  . heparin 1,200 Units/hr (08/19/17 1018)   Assessment: 62 yo male with history of HTN, DM, HLD, aortic valve s/p  replacement (on warfarin), aortic dissection admitted 8/15 for right hemicolectomy, colonoscopy, umbilical hernia repair. Anticoagulation has been on hold.  Today 8/17, POD2, starting IV heparin without a bolus and resume warfarin.  Warfarin dose PTA reported as 6mg  daily except 3mg  on Wednesdays; last dose was taken 8/10 in preparation for surgery.  He was bridged with Lovenox 150mg  SQ q12h with last dose taken 8/14.  Goal of Therapy:  Heparin level 0.3-0.7 units/ml  INR 2-3 (confirmed with outpatient anticoag clinic notes) Monitor platelets by anticoagulation protocol: Yes   Today, 08/19/2017  Heparin infusion begun at 1200 units/hr, no bolus 1st Heparin level = 0.15 units/ml, subtherapeutic   Plan:   Increase Heparin infusion to 1500 units/hr, no bolus  Next Heparin level 0400  Warfarin 5mg  PO x 1 today at 18:00  Monitor closely for signs/symptoms of bleeding  Daily heparin level, CBC and PT/INR  Minda Ditto PharmD Pager [phone] 08/19/2017, 7:31 PM

## 2017-08-19 NOTE — Progress Notes (Signed)
ANTICOAGULATION CONSULT NOTE - Initial Consult  Pharmacy Consult for Heparin/Warfarin Indication: History of DVT, aortic valve replacement  Allergies  Allergen Reactions  . Chlorthalidone Swelling    Testicular/Scrotum edema  . Hydralazine Other (See Comments)    GI Upset (intolerance)  . Lisinopril Swelling    Facial Edema (intolerance)    Patient Measurements: Height: 5\' 10"  (177.8 cm) Weight: 285 lb 6.4 oz (129.5 kg)(285.4 lb) IBW/kg (Calculated) : 73 Heparin Dosing Weight: 102.6  Vital Signs: Temp: 98.6 F (37 C) (08/17 0557) Temp Source: Oral (08/17 0557) BP: 116/77 (08/17 0557) Pulse Rate: 65 (08/17 0557)  Labs: Recent Labs    08/17/17 0819 08/18/17 0437 08/19/17 0440  HGB  --  11.2* 10.7*  HCT  --  35.2* 34.3*  PLT  --  170 166  LABPROT 16.4*  --   --   INR 1.33  --   --   CREATININE  --  1.06 0.83    Estimated Creatinine Clearance: 126.4 mL/min (by C-G formula based on SCr of 0.83 mg/dL).   Medical History: Past Medical History:  Diagnosis Date  . Ascending aortic dissection ([place])   . Diabetes mellitus without complication ([place])   . DVT (deep vein thrombosis) in pregnancy (HCC)    legs and lungs   . Hyperlipemia   . Hypertension   . Kidney stones     Medications:  Scheduled:  . acetaminophen  1,000 mg Oral Q6H  . allopurinol  100 mg Oral Q1500  . alvimopan  12 mg Oral BID  . amLODipine  10 mg Oral QHS  . doxazosin  8 mg Oral QHS  . famotidine  10 mg Oral Daily  . glipiZIDE  10 mg Oral BID  . heparin injection (subcutaneous)  5,000 Units Subcutaneous Q8H  . insulin aspart  0-15 Units Subcutaneous TID WC  . metoprolol tartrate  100 mg Oral BID  . saccharomyces boulardii  250 mg Oral BID  . simvastatin  10 mg Oral QPM  . spironolactone  25 mg Oral QHS   Infusions:   PRN: alum & mag hydroxide-simeth, diphenhydrAMINE **OR** diphenhydrAMINE, HYDROmorphone (DILAUDID) injection, ibuprofen, ondansetron **OR** ondansetron (ZOFRAN) IV,  traMADol  Assessment: 62 yo male with history of HTN, DM, HLD, aortic valve s/p replacement (on warfarin), aortic dissection admitted 8/15 for right hemicolectomy, colonoscopy, umbilical hernia repair. Anticoagulation has been on hold.  Today 8/17, POD2, starting IV heparin without a bolus and resume warfarin.  Warfarin dose PTA reported as 6mg  daily except 3mg  on Wednesdays; last dose was taken 8/10 in preparation for surgery.  He was bridged with Lovenox 150mg  SQ q12h with last dose taken 8/14.  Goal of Therapy:  Heparin level 0.3-0.7 units/ml  INR 2-3 (confirmed with outpatient anticoag clinic notes) Monitor platelets by anticoagulation protocol: Yes   Plan:   No heparin bolus per discussion with MD  Heparin 1200 units/hr (~12 units/kg/hr) IV infusion  Check heparin level in 8hrs  Warfarin 5mg  PO x 1 today at 18:00  Monitor closely for signs/symptoms of bleeding  Daily heparin level, CBC and PT/INR  Peggyann Juba, PharmD, BCPS Pager: [phone] 08/19/2017,9:01 AM

## 2017-08-20 LAB — CBC
HCT: 33.1 % — ABNORMAL LOW (ref 39.0–52.0)
HEMOGLOBIN: 10.5 g/dL — AB (ref 13.0–17.0)
MCH: 25.2 pg — AB (ref 26.0–34.0)
MCHC: 31.7 g/dL (ref 30.0–36.0)
MCV: 79.4 fL (ref 78.0–100.0)
PLATELETS: 184 10*3/uL (ref 150–400)
RBC: 4.17 MIL/uL — ABNORMAL LOW (ref 4.22–5.81)
RDW: 16.6 % — AB (ref 11.5–15.5)
WBC: 7.3 10*3/uL (ref 4.0–10.5)

## 2017-08-20 LAB — BASIC METABOLIC PANEL
Anion gap: 6 (ref 5–15)
BUN: 11 mg/dL (ref 8–23)
CALCIUM: 8.8 mg/dL — AB (ref 8.9–10.3)
CO2: 28 mmol/L (ref 22–32)
CREATININE: 0.9 mg/dL (ref 0.61–1.24)
Chloride: 103 mmol/L (ref 98–111)
Glucose, Bld: 159 mg/dL — ABNORMAL HIGH (ref 70–99)
Potassium: 3.7 mmol/L (ref 3.5–5.1)
Sodium: 137 mmol/L (ref 135–145)

## 2017-08-20 LAB — GLUCOSE, CAPILLARY
GLUCOSE-CAPILLARY: 205 mg/dL — AB (ref 70–99)
GLUCOSE-CAPILLARY: 163 mg/dL — AB (ref 70–99)
Glucose-Capillary: 197 mg/dL — ABNORMAL HIGH (ref 70–99)

## 2017-08-20 LAB — HEPARIN LEVEL (UNFRACTIONATED)
HEPARIN UNFRACTIONATED: 0.41 [IU]/mL (ref 0.30–0.70)
Heparin Unfractionated: 0.19 IU/mL — ABNORMAL LOW (ref 0.30–0.70)
Heparin Unfractionated: 0.35 IU/mL (ref 0.30–0.70)

## 2017-08-20 LAB — PHOSPHORUS: PHOSPHORUS: 3.4 mg/dL (ref 2.5–4.6)

## 2017-08-20 LAB — MAGNESIUM: Magnesium: 1.8 mg/dL (ref 1.7–2.4)

## 2017-08-20 LAB — PROTIME-INR
INR: 1.16
PROTHROMBIN TIME: 14.7 s (ref 11.4–15.2)

## 2017-08-20 MED ORDER — WARFARIN SODIUM 6 MG PO TABS
6.0000 mg | ORAL_TABLET | Freq: Once | ORAL | Status: AC
  Administered 2017-08-20: 6 mg via ORAL
  Filled 2017-08-20: qty 1

## 2017-08-20 NOTE — Progress Notes (Signed)
  Progress Note: General Surgery Service   Assessment/Plan:  Active Problems:   Colon polyp s/p Procedure(s): LAPAROSCOPIC  RIGHT HEMI COLECTOMY, COLONOSCOPY, UMBILICAL HERNIA REPAIR 7/48/2707 -warfarin restarted, no signs of bleeding -continue diet    LOS: 3 days  Chief Complaint/Subjective: Tolerating some diet, multiple bouts of diarrhea overnight, pain minimal  Objective: Vital signs in last 24 hours: Temp:  [98.3 F (36.8 C)-99.1 F (37.3 C)] 98.3 F (36.8 C) (08/18 0524) Pulse Rate:  [62-76] 62 (08/18 0524) Resp:  [16-20] 16 (08/18 0524) BP: (108-135)/(60-90) 109/77 (08/18 0524) SpO2:  [96 %-97 %] 96 % (08/18 0524) Weight:  [129.5 kg] 129.5 kg (08/17 0857)    Intake/Output from previous day: 08/17 0701 - 08/18 0700 In: 844.8 [P.O.:600; I.V.:244.8] Out: 900 [Urine:900] Intake/Output this shift: No intake/output data recorded.  Lungs: CTAB  Abd: dressings in place without seep through, left lower quadrant ecchymosis  Extremities: no edema  Neuro: AOx4  Lab Results: CBC  Recent Labs    08/19/17 0440 08/20/17 0441  WBC 7.8 7.3  HGB 10.7* 10.5*  HCT 34.3* 33.1*  PLT 166 184   BMET Recent Labs    08/19/17 0440 08/20/17 0441  NA 138 137  K 3.7 3.7  CL 106 103  CO2 27 28  GLUCOSE 139* 159*  BUN 10 11  CREATININE 0.83 0.90  CALCIUM 9.4 8.8*   PT/INR Recent Labs    08/20/17 0441  LABPROT 14.7  INR 1.16   ABG No results for input(s): PHART, HCO3 in the last 72 hours.  Invalid input(s): PCO2, PO2  Studies/Results:  Anti-infectives: Anti-infectives (From admission, onward)   Start     Dose/Rate Route Frequency Ordered Stop   08/17/17 1400  neomycin (MYCIFRADIN) tablet 1,000 mg  Status:  Discontinued     1,000 mg Oral 3 times per day 08/17/17 0752 08/17/17 0802   08/17/17 1400  metroNIDAZOLE (FLAGYL) tablet 1,000 mg  Status:  Discontinued     1,000 mg Oral 3 times per day 08/17/17 0752 08/17/17 0802   08/17/17 0800  cefoTEtan  (CEFOTAN) 2 g in sodium chloride 0.9 % 100 mL IVPB     2 g 200 mL/hr over 30 Minutes Intravenous On call to O.R. 08/17/17 8675 08/17/17 0949      Medications: Scheduled Meds: . acetaminophen  1,000 mg Oral Q6H  . allopurinol  100 mg Oral Q1500  . alvimopan  12 mg Oral BID  . amLODipine  10 mg Oral QHS  . doxazosin  8 mg Oral QHS  . famotidine  10 mg Oral Daily  . glipiZIDE  10 mg Oral BID  . insulin aspart  0-15 Units Subcutaneous TID WC  . metoprolol tartrate  100 mg Oral BID  . saccharomyces boulardii  250 mg Oral BID  . simvastatin  10 mg Oral QPM  . spironolactone  25 mg Oral QHS  . Warfarin - Pharmacist Dosing Inpatient   Does not apply q1800   Continuous Infusions: . heparin 1,800 Units/hr (08/20/17 0700)   PRN Meds:.alum & mag hydroxide-simeth, diphenhydrAMINE **OR** diphenhydrAMINE, HYDROmorphone (DILAUDID) injection, ibuprofen, ondansetron **OR** ondansetron (ZOFRAN) IV, traMADol  Mickeal Skinner, MD Pg# [phone] [facility]

## 2017-08-20 NOTE — Progress Notes (Signed)
[place] for Heparin/Warfarin Indication: History of DVT, aortic valve replacement  Allergies  Allergen Reactions  . Chlorthalidone Swelling    Testicular/Scrotum edema  . Hydralazine Other (See Comments)    GI Upset (intolerance)  . Lisinopril Swelling    Facial Edema (intolerance)    Patient Measurements: Height: 5\' 10"  (177.8 cm) Weight: 285 lb 6.4 oz (129.5 kg)(285.4 lb) IBW/kg (Calculated) : 73 Heparin Dosing Weight: 102.6  Vital Signs: Temp: 98.4 F (36.9 C) (08/18 1312) Temp Source: Oral (08/18 1312) BP: 97/68 (08/18 1312) Pulse Rate: 68 (08/18 1312)  Labs: Recent Labs    08/18/17 0437 08/19/17 0440 08/19/17 1813 08/20/17 0441 08/20/17 0448 08/20/17 1235  HGB 11.2* 10.7*  --  10.5*  --   --   HCT 35.2* 34.3*  --  33.1*  --   --   PLT 170 166  --  184  --   --   LABPROT  --   --   --  14.7  --   --   INR  --   --   --  1.16  --   --   HEPARINUNFRC  --   --  0.15*  --  0.19* 0.35  CREATININE 1.06 0.83  --  0.90  --   --    Estimated Creatinine Clearance: 116.5 mL/min (by C-G formula based on SCr of 0.9 mg/dL).  Medical History: Past Medical History:  Diagnosis Date  . Ascending aortic dissection ([place])   . Diabetes mellitus without complication ([place])   . DVT (deep vein thrombosis) in pregnancy (HCC)    legs and lungs   . Hyperlipemia   . Hypertension   . Kidney stones    Medications:  Scheduled:  . acetaminophen  1,000 mg Oral Q6H  . allopurinol  100 mg Oral Q1500  . amLODipine  10 mg Oral QHS  . doxazosin  8 mg Oral QHS  . famotidine  10 mg Oral Daily  . glipiZIDE  10 mg Oral BID  . insulin aspart  0-15 Units Subcutaneous TID WC  . metoprolol tartrate  100 mg Oral BID  . saccharomyces boulardii  250 mg Oral BID  . simvastatin  10 mg Oral QPM  . spironolactone  25 mg Oral QHS  . Warfarin - Pharmacist Dosing Inpatient   Does not apply q1800   Infusions:  . heparin 1,800 Units/hr (08/20/17 0700)    Assessment: 62 yo male with history of HTN, DM, HLD, aortic valve s/p replacement (on warfarin), aortic dissection admitted 8/15 for right hemicolectomy, colonoscopy, umbilical hernia repair. Anticoagulation has been on hold.  Today 8/17, POD2, starting IV heparin without a bolus and resume warfarin.  Warfarin dose PTA reported as 6mg  daily except 3mg  on Wednesdays; last dose was taken 8/10 in preparation for surgery.  He was bridged with Lovenox 150mg  SQ q12h with last dose taken 8/14.  Today, 08/20/2017  Heparin level therapeutic (0.35) on heparin 1800 units/hr  INR subtherapeutic (1.16) as expected after resuming last PM  CBC: Hgb low but stable at 10.5, Plts wnl  No bleeding reported  Regular diet ordered  PRN ibuprofen ordered, can increase risk of bleeding  Goal of Therapy:  Heparin level 0.3-0.7 units/ml  INR 2-3 (confirmed with outpatient anticoag clinic notes) Monitor platelets by anticoagulation protocol: Yes   Today, 08/20/2017    Plan:   Continue Heparin infusion at 1800 units/hr  Recheck heparin level in 6hr  Warfarin 6mg  PO x 1  today at 18:00  Monitor closely for signs/symptoms of bleeding  Daily heparin level, CBC and PT/INR  Peggyann Juba, PharmD, BCPS Pager: [phone] 08/20/2017, 1:25 PM

## 2017-08-20 NOTE — Progress Notes (Signed)
ANTICOAGULATION CONSULT NOTE - Follow Up Consult  Pharmacy Consult for Heparin Indication: History of DVT, aortic valve replacement  Allergies  Allergen Reactions  . Chlorthalidone Swelling    Testicular/Scrotum edema  . Hydralazine Other (See Comments)    GI Upset (intolerance)  . Lisinopril Swelling    Facial Edema (intolerance)    Patient Measurements: Height: 5\' 10"  (177.8 cm) Weight: 285 lb 6.4 oz (129.5 kg)(285.4 lb) IBW/kg (Calculated) : 73 Heparin Dosing Weight:   Vital Signs: Temp: 98.3 F (36.8 C) (08/18 0524) Temp Source: Oral (08/18 0524) BP: 109/77 (08/18 0524) Pulse Rate: 62 (08/18 0524)  Labs: Recent Labs    08/17/17 0819  08/18/17 0437 08/19/17 0440 08/19/17 1813 08/20/17 0441 08/20/17 0448  HGB  --    < > 11.2* 10.7*  --  10.5*  --   HCT  --   --  35.2* 34.3*  --  33.1*  --   PLT  --   --  170 166  --  184  --   LABPROT 16.4*  --   --   --   --  14.7  --   INR 1.33  --   --   --   --  1.16  --   HEPARINUNFRC  --   --   --   --  0.15*  --  0.19*  CREATININE  --   --  1.06 0.83  --  0.90  --    < > = values in this interval not displayed.    Estimated Creatinine Clearance: 116.5 mL/min (by C-G formula based on SCr of 0.9 mg/dL).   Medications:  Infusions:  . heparin 1,500 Units/hr (08/20/17 0205)    Assessment: Patient with low heparin level.  No heparin issues per RN.  Goal of Therapy:  Heparin level 0.3-0.7 units/ml Monitor platelets by anticoagulation protocol: Yes   Plan:  Increase heparin to 1800 units/hr Recheck level at [address] [place] 08/20/2017,6:34 AM

## 2017-08-20 NOTE — Progress Notes (Signed)
[place] for Heparin/Warfarin Indication: History of DVT, aortic valve replacement  Allergies  Allergen Reactions  . Chlorthalidone Swelling    Testicular/Scrotum edema  . Hydralazine Other (See Comments)    GI Upset (intolerance)  . Lisinopril Swelling    Facial Edema (intolerance)   Patient Measurements: Height: 5\' 10"  (177.8 cm) Weight: 285 lb 6.4 oz (129.5 kg)(285.4 lb) IBW/kg (Calculated) : 73 Heparin Dosing Weight: 102.6  Vital Signs: Temp: 98.4 F (36.9 C) (08/18 1312) Temp Source: Oral (08/18 1312) BP: 97/68 (08/18 1312) Pulse Rate: 68 (08/18 1312)  Labs: Recent Labs    08/18/17 0437 08/19/17 0440 08/19/17 1813 08/20/17 0441 08/20/17 0448 08/20/17 1235  HGB 11.2* 10.7*  --  10.5*  --   --   HCT 35.2* 34.3*  --  33.1*  --   --   PLT 170 166  --  184  --   --   LABPROT  --   --   --  14.7  --   --   INR  --   --   --  1.16  --   --   HEPARINUNFRC  --   --  0.15*  --  0.19* 0.35  CREATININE 1.06 0.83  --  0.90  --   --    Estimated Creatinine Clearance: 116.5 mL/min (by C-G formula based on SCr of 0.9 mg/dL).  Medical History: Past Medical History:  Diagnosis Date  . Ascending aortic dissection ([place])   . Diabetes mellitus without complication ([place])   . DVT (deep vein thrombosis) in pregnancy (HCC)    legs and lungs   . Hyperlipemia   . Hypertension   . Kidney stones    Medications:  Scheduled:  . acetaminophen  1,000 mg Oral Q6H  . allopurinol  100 mg Oral Q1500  . amLODipine  10 mg Oral QHS  . doxazosin  8 mg Oral QHS  . famotidine  10 mg Oral Daily  . glipiZIDE  10 mg Oral BID  . insulin aspart  0-15 Units Subcutaneous TID WC  . metoprolol tartrate  100 mg Oral BID  . saccharomyces boulardii  250 mg Oral BID  . simvastatin  10 mg Oral QPM  . spironolactone  25 mg Oral QHS  . Warfarin - Pharmacist Dosing Inpatient   Does not apply q1800   Infusions:  . heparin 1,800 Units/hr (08/20/17 1743)    Assessment: 62 yo male with history of HTN, DM, HLD, aortic valve s/p replacement (on warfarin), aortic dissection admitted 8/15 for right hemicolectomy, colonoscopy, umbilical hernia repair. Anticoagulation has been on hold.  Today 8/17, POD2, starting IV heparin without a bolus and resume warfarin.  Warfarin dose PTA reported as 6mg  daily except 3mg  on Wednesdays; last dose was taken 8/10 in preparation for surgery.  He was bridged with Lovenox 150mg  SQ q12h with last dose taken 8/14.  Today, 08/20/2017  Heparin level therapeutic (0.35) on heparin 1800 units/hr  Repeat Heparin level 0.41, remains in range  INR subtherapeutic (1.16) as expected after resuming last PM  CBC: Hgb low but stable at 10.5, Plts wnl  No bleeding reported  Regular diet ordered  PRN ibuprofen ordered, can increase risk of bleeding  Goal of Therapy:  Heparin level 0.3-0.7 units/ml  INR 2-3 (confirmed with outpatient anticoag clinic notes) Monitor platelets by anticoagulation protocol: Yes    Plan:   Continue Heparin infusion at 1800 units/hr  Warfarin 6mg  PO x 1 today at 18:00  Monitor closely for signs/symptoms of bleeding  Daily heparin level, CBC and PT/INR  Minda Ditto PharmD Pager [phone] 08/20/2017, 8:00 PM

## 2017-08-21 LAB — PROTIME-INR
INR: 1.14
Prothrombin Time: 14.6 seconds (ref 11.4–15.2)

## 2017-08-21 LAB — BASIC METABOLIC PANEL
ANION GAP: 9 (ref 5–15)
BUN: 14 mg/dL (ref 8–23)
CO2: 26 mmol/L (ref 22–32)
Calcium: 9.3 mg/dL (ref 8.9–10.3)
Chloride: 101 mmol/L (ref 98–111)
Creatinine, Ser: 0.99 mg/dL (ref 0.61–1.24)
GFR calc Af Amer: >60 mL/min (ref >60)
GFR calc non Af Amer: >60 mL/min (ref >60)
GLUCOSE: 205 mg/dL — AB (ref 70–99)
POTASSIUM: 3.7 mmol/L (ref 3.5–5.1)
Sodium: 136 mmol/L (ref 135–145)

## 2017-08-21 LAB — CBC
HEMATOCRIT: 32.5 % — AB (ref 39.0–52.0)
Hemoglobin: 10.3 g/dL — ABNORMAL LOW (ref 13.0–17.0)
MCH: 25.1 pg — ABNORMAL LOW (ref 26.0–34.0)
MCHC: 31.7 g/dL (ref 30.0–36.0)
MCV: 79.1 fL (ref 78.0–100.0)
Platelets: 201 10*3/uL (ref 150–400)
RBC: 4.11 MIL/uL — AB (ref 4.22–5.81)
RDW: 16.4 % — ABNORMAL HIGH (ref 11.5–15.5)
WBC: 6.4 10*3/uL (ref 4.0–10.5)

## 2017-08-21 LAB — HEMOGLOBIN AND HEMATOCRIT, BLOOD
HEMATOCRIT: 32.8 % — AB (ref 39.0–52.0)
HEMOGLOBIN: 10.4 g/dL — AB (ref 13.0–17.0)

## 2017-08-21 LAB — GLUCOSE, CAPILLARY
GLUCOSE-CAPILLARY: 133 mg/dL — AB (ref 70–99)
GLUCOSE-CAPILLARY: 218 mg/dL — AB (ref 70–99)
Glucose-Capillary: 128 mg/dL — ABNORMAL HIGH (ref 70–99)
Glucose-Capillary: 129 mg/dL — ABNORMAL HIGH (ref 70–99)
Glucose-Capillary: 138 mg/dL — ABNORMAL HIGH (ref 70–99)
Glucose-Capillary: 159 mg/dL — ABNORMAL HIGH (ref 70–99)

## 2017-08-21 LAB — PHOSPHORUS: Phosphorus: 3.3 mg/dL (ref 2.5–4.6)

## 2017-08-21 LAB — HEPARIN LEVEL (UNFRACTIONATED): HEPARIN UNFRACTIONATED: 0.43 [IU]/mL (ref 0.30–0.70)

## 2017-08-21 LAB — MAGNESIUM: Magnesium: 1.8 mg/dL (ref 1.7–2.4)

## 2017-08-21 MED ORDER — AMLODIPINE BESYLATE 5 MG PO TABS
5.0000 mg | ORAL_TABLET | Freq: Every day | ORAL | Status: DC
  Administered 2017-08-21 – 2017-08-25 (×5): 5 mg via ORAL
  Filled 2017-08-21 (×5): qty 1

## 2017-08-21 MED ORDER — METOCLOPRAMIDE HCL 5 MG PO TABS
5.0000 mg | ORAL_TABLET | Freq: Three times a day (TID) | ORAL | Status: DC
  Administered 2017-08-21 – 2017-08-24 (×13): 5 mg via ORAL
  Filled 2017-08-21 (×15): qty 1

## 2017-08-21 MED ORDER — INSULIN ASPART 100 UNIT/ML ~~LOC~~ SOLN
0.0000 [IU] | Freq: Three times a day (TID) | SUBCUTANEOUS | Status: DC
  Administered 2017-08-21: 3 [IU] via SUBCUTANEOUS
  Administered 2017-08-21 – 2017-08-22 (×2): 2 [IU] via SUBCUTANEOUS
  Administered 2017-08-22 (×2): 3 [IU] via SUBCUTANEOUS
  Administered 2017-08-23 (×3): 2 [IU] via SUBCUTANEOUS
  Administered 2017-08-24: 3 [IU] via SUBCUTANEOUS
  Administered 2017-08-24: 2 [IU] via SUBCUTANEOUS
  Administered 2017-08-24 – 2017-08-25 (×2): 3 [IU] via SUBCUTANEOUS
  Administered 2017-08-25 (×2): 2 [IU] via SUBCUTANEOUS

## 2017-08-21 MED ORDER — WARFARIN SODIUM 6 MG PO TABS
6.0000 mg | ORAL_TABLET | Freq: Once | ORAL | Status: AC
  Administered 2017-08-21: 6 mg via ORAL
  Filled 2017-08-21: qty 1

## 2017-08-21 NOTE — Progress Notes (Signed)
Subjective Had 2 BMs this morning that were brown admixed with dark red blood. Denies abdominal pain. Denies n/v; does have some early satiety but he states this was his baseline prior to surgery. Eating ~50% of trays. Ambulating. Denies any other complaints.  Objective: Vital signs in last 24 hours: Temp:  [98.4 F (36.9 C)-98.7 F (37.1 C)] 98.7 F (37.1 C) (08/19 0536) Pulse Rate:  [67-68] 67 (08/19 0536) Resp:  [16-18] 18 (08/19 0536) BP: (97-112)/(68-72) 112/72 (08/19 0536) SpO2:  [96 %-97 %] 97 % (08/19 0536) Weight:  [133.1 kg] 133.1 kg (08/19 0536) Last BM Date: 08/20/17  Intake/Output from previous day: 08/18 0701 - 08/19 0700 In: 908 [P.O.:480; I.V.:428] Out: 300 [Urine:300] Intake/Output this shift: No intake/output data recorded.  Gen: NAD, comfortable CV: RRR Pulm: Normal work of breathing Abd: Obese, soft, NT; no significantly distended relative to preop - large abdomen at baseline. Incisions c/d/i without erythema, staples in place Ext: SCDs in place  Lab Results: CBC  Recent Labs    08/20/17 0441 08/21/17 0456  WBC 7.3 6.4  HGB 10.5* 10.3*  HCT 33.1* 32.5*  PLT 184 201   BMET Recent Labs    08/20/17 0441 08/21/17 0456  NA 137 136  K 3.7 3.7  CL 103 101  CO2 28 26  GLUCOSE 159* 205*  BUN 11 14  CREATININE 0.90 0.99  CALCIUM 8.8* 9.3   PT/INR Recent Labs    08/20/17 0441 08/21/17 0456  LABPROT 14.7 14.6  INR 1.16 1.14   ABG No results for input(s): PHART, HCO3 in the last 72 hours.  Invalid input(s): PCO2, PO2  Studies/Results:  Anti-infectives: Anti-infectives (From admission, onward)   Start     Dose/Rate Route Frequency Ordered Stop   08/17/17 1400  neomycin (MYCIFRADIN) tablet 1,000 mg  Status:  Discontinued     1,000 mg Oral 3 times per day 08/17/17 0752 08/17/17 0802   08/17/17 1400  metroNIDAZOLE (FLAGYL) tablet 1,000 mg  Status:  Discontinued     1,000 mg Oral 3 times per day 08/17/17 0752 08/17/17 0802   08/17/17 0800   cefoTEtan (CEFOTAN) 2 g in sodium chloride 0.9 % 100 mL IVPB     2 g 200 mL/hr over 30 Minutes Intravenous On call to O.R. 08/17/17 0752 08/17/17 0949       Assessment/Plan: Patient Active Problem List   Diagnosis Date Noted  . Colon polyp 08/17/2017  . Anemia   . Polyp of sigmoid colon   . Benign neoplasm of descending colon   . Colon neoplasm   . Abdominal pain, epigastric   . Gastritis without bleeding   . Nephrolithiasis 11/06/2015  . Cough with sputum 01/14/2015  . Fatigue 01/14/2015  . Obesity 01/14/2015  . GERD (gastroesophageal reflux disease) 04/24/2014  . Long term current use of anticoagulant therapy 04/24/2014  . Type 2 diabetes mellitus with complication ([place]) 17/40/8144  . Ascending aortic dissection ([place]) 10/04/2013  . HTN (hypertension) 05/28/2013  . Pulmonary emboli ([place]) 06/26/2012  . Pulmonary edema 06/23/2012  . Respiratory failure with hypoxia ([place]) 06/23/2012  . Osteoarthritis of left hip 04/19/2012  . Right leg DVT ([place]) 06/17/2011   s/p Procedure(s): LAPAROSCOPIC  RIGHT HEMICOLECTOMY, COLONOSCOPY, BILATERAL TAP BLOCKS, UMBILICAL HERNIA REPAIR 08/20/5629  -Ambulate 5x/day -Diabetic diet as tolerated -Continue sliding scale insulin + glyburide -Repeat Hgb around noon; monitor for additional bloody BMs - if this occurs, will plan to hold heparin drip for another 24-48hrs -PPx: Heparin gtt; SCDs, H2B -Dispo: Pending  toleration of diet, pain controlled on oral analgesics, having bowel fxn and back on therapeutic levels of warfarin without bleeding   LOS: 4 days   Sharon Mt. Dema Severin, M.D. General and Colorectal Surgery [facility]

## 2017-08-21 NOTE — Care Management Important Message (Signed)
Important Message  Patient Details  Name: Shane Bean MRN: [id number] Date of Birth: 1955/01/11   Medicare Important Message Given:  Yes    Kerin Salen 08/21/2017, 11:27 AMImportant Message  Patient Details  Name: Shane Bean MRN: [id number] Date of Birth: 10/07/1955   Medicare Important Message Given:  Yes    Kerin Salen 08/21/2017, 11:27 AM

## 2017-08-21 NOTE — Progress Notes (Signed)
Was approached by lab and informed that a small skin tear occurred on patient's right [facility] after a previously put bandage was removed. Came into room and assessed patient. Ointment, a nonstick bandage, and tape applied. No signs of bleeding. Will continue to monitor.

## 2017-08-21 NOTE — Progress Notes (Signed)
[place] for Heparin/Warfarin Indication: History of DVT, aortic valve replacement  Allergies  Allergen Reactions  . Chlorthalidone Swelling    Testicular/Scrotum edema  . Hydralazine Other (See Comments)    GI Upset (intolerance)  . Lisinopril Swelling    Facial Edema (intolerance)   Patient Measurements: Height: 5\' 10"  (177.8 cm) Weight: 293 lb 6.9 oz (133.1 kg) IBW/kg (Calculated) : 73 Heparin Dosing Weight: 102.6  Vital Signs: Temp: 98.7 F (37.1 C) (08/19 0536) Temp Source: Oral (08/19 0536) BP: 110/72 (08/19 1003) Pulse Rate: 71 (08/19 1003)  Labs: Recent Labs    08/19/17 0440  08/20/17 0441  08/20/17 1235 08/20/17 1813 08/21/17 0456  HGB 10.7*  --  10.5*  --   --   --  10.3*  HCT 34.3*  --  33.1*  --   --   --  32.5*  PLT 166  --  184  --   --   --  201  LABPROT  --   --  14.7  --   --   --  14.6  INR  --   --  1.16  --   --   --  1.14  HEPARINUNFRC  --    < >  --    < > 0.35 0.41 0.43  CREATININE 0.83  --  0.90  --   --   --  0.99   < > = values in this interval not displayed.   Estimated Creatinine Clearance: 107.5 mL/min (by C-G formula based on SCr of 0.99 mg/dL).  Medical History: Past Medical History:  Diagnosis Date  . Ascending aortic dissection ([place])   . Diabetes mellitus without complication ([place])   . DVT (deep vein thrombosis) in pregnancy (HCC)    legs and lungs   . Hyperlipemia   . Hypertension   . Kidney stones    Medications:  Scheduled:  . acetaminophen  1,000 mg Oral Q6H  . allopurinol  100 mg Oral Q1500  . amLODipine  10 mg Oral QHS  . doxazosin  8 mg Oral QHS  . famotidine  10 mg Oral Daily  . glipiZIDE  10 mg Oral BID  . insulin aspart  0-15 Units Subcutaneous TID WC  . metoprolol tartrate  100 mg Oral BID  . saccharomyces boulardii  250 mg Oral BID  . simvastatin  10 mg Oral QPM  . spironolactone  25 mg Oral QHS  . Warfarin - Pharmacist Dosing Inpatient   Does not apply q1800    Infusions:  . heparin 1,800 Units/hr (08/21/17 7591)   Assessment: 62 yo male with history of HTN, DM, HLD, aortic valve s/p replacement (on warfarin), aortic dissection admitted 8/15 for right hemicolectomy, colonoscopy, umbilical hernia repair. Anticoagulation has been on hold.  Today 8/17, POD2, starting IV heparin without a bolus and resume warfarin.  Warfarin dose PTA reported as 6mg  daily except 3mg  on Wednesdays; last dose was taken 8/10 in preparation for surgery.  He was bridged with Lovenox 150mg  SQ q12h with last dose taken 8/14.  Today, 08/21/2017  Heparin level remains therapeutic on heparin 1800 units/hr  INR subtherapeutic as expected after 2 doses of warfarin so far post op  CBC: Hgb low but stable at 10.5, Plts wnl  Per Surgery note, some blood in bowel movements - to continue to monitor closely and may need to hold heparin if they continue  Regular diet ordered  PRN ibuprofen ordered, can increase risk of bleeding  Goal of Therapy:  Heparin level 0.3-0.7 units/ml  INR 2-3 (confirmed with outpatient anticoag clinic notes) Monitor platelets by anticoagulation protocol: Yes    Plan:   Continue Heparin infusion at 1800 units/hr  Repeat 6mg  warfarin today  Monitor closely for signs/symptoms of bleeding  Daily heparin level, CBC and PT/INR   Adrian Saran, PharmD, BCPS Pager [phone] 08/21/2017 11:05 AM

## 2017-08-22 LAB — MAGNESIUM: Magnesium: 1.7 mg/dL (ref 1.7–2.4)

## 2017-08-22 LAB — BASIC METABOLIC PANEL
Anion gap: 9 (ref 5–15)
BUN: 14 mg/dL (ref 8–23)
CO2: 27 mmol/L (ref 22–32)
Calcium: 9.4 mg/dL (ref 8.9–10.3)
Chloride: 103 mmol/L (ref 98–111)
Creatinine, Ser: 1.06 mg/dL (ref 0.61–1.24)
GFR calc Af Amer: >60 mL/min (ref >60)
GLUCOSE: 163 mg/dL — AB (ref 70–99)
Potassium: 3.7 mmol/L (ref 3.5–5.1)
Sodium: 139 mmol/L (ref 135–145)

## 2017-08-22 LAB — CBC
HCT: 31.8 % — ABNORMAL LOW (ref 39.0–52.0)
Hemoglobin: 10.2 g/dL — ABNORMAL LOW (ref 13.0–17.0)
MCH: 25.4 pg — ABNORMAL LOW (ref 26.0–34.0)
MCHC: 32.1 g/dL (ref 30.0–36.0)
MCV: 79.1 fL (ref 78.0–100.0)
PLATELETS: 192 10*3/uL (ref 150–400)
RBC: 4.02 MIL/uL — AB (ref 4.22–5.81)
RDW: 16.4 % — ABNORMAL HIGH (ref 11.5–15.5)
WBC: 6.3 10*3/uL (ref 4.0–10.5)

## 2017-08-22 LAB — GLUCOSE, CAPILLARY
GLUCOSE-CAPILLARY: 144 mg/dL — AB (ref 70–99)
Glucose-Capillary: 151 mg/dL — ABNORMAL HIGH (ref 70–99)
Glucose-Capillary: 200 mg/dL — ABNORMAL HIGH (ref 70–99)
Glucose-Capillary: 128 mg/dL — ABNORMAL HIGH (ref 70–99)
Glucose-Capillary: 172 mg/dL — ABNORMAL HIGH (ref 70–99)

## 2017-08-22 LAB — PHOSPHORUS: Phosphorus: 4 mg/dL (ref 2.5–4.6)

## 2017-08-22 LAB — PROTIME-INR
INR: 1.28
PROTHROMBIN TIME: 15.9 s — AB (ref 11.4–15.2)

## 2017-08-22 LAB — HEMOGLOBIN AND HEMATOCRIT, BLOOD
HEMATOCRIT: 32 % — AB (ref 39.0–52.0)
Hemoglobin: 10 g/dL — ABNORMAL LOW (ref 13.0–17.0)

## 2017-08-22 LAB — HEPARIN LEVEL (UNFRACTIONATED): HEPARIN UNFRACTIONATED: 0.42 [IU]/mL (ref 0.30–0.70)

## 2017-08-22 MED ORDER — WARFARIN SODIUM 6 MG PO TABS
6.0000 mg | ORAL_TABLET | Freq: Once | ORAL | Status: AC
  Administered 2017-08-22: 6 mg via ORAL
  Filled 2017-08-22: qty 1

## 2017-08-22 MED ORDER — PSYLLIUM 95 % PO PACK
1.0000 | PACK | Freq: Every day | ORAL | Status: DC
  Administered 2017-08-22 – 2017-08-25 (×4): 1 via ORAL
  Filled 2017-08-22 (×5): qty 1

## 2017-08-22 MED ORDER — MAGNESIUM OXIDE 400 (241.3 MG) MG PO TABS
400.0000 mg | ORAL_TABLET | Freq: Once | ORAL | Status: AC
  Administered 2017-08-22: 400 mg via ORAL
  Filled 2017-08-22: qty 1

## 2017-08-22 NOTE — Progress Notes (Signed)
[place] for Heparin/Warfarin Indication: History of DVT, aortic valve replacement  Allergies  Allergen Reactions  . Chlorthalidone Swelling    Testicular/Scrotum edema  . Hydralazine Other (See Comments)    GI Upset (intolerance)  . Lisinopril Swelling    Facial Edema (intolerance)   Patient Measurements: Height: 5\' 10"  (177.8 cm) Weight: 293 lb 6.9 oz (133.1 kg) IBW/kg (Calculated) : 73 Heparin Dosing Weight: 102.6  Vital Signs: Temp: 98.6 F (37 C) (08/20 0951) Temp Source: Oral (08/20 0648) BP: 101/68 (08/20 0951) Pulse Rate: 78 (08/20 0951)  Labs: Recent Labs    08/20/17 0441  08/20/17 1813 08/21/17 0456 08/21/17 1211 08/22/17 0535  HGB 10.5*  --   --  10.3* 10.4* 10.2*  HCT 33.1*  --   --  32.5* 32.8* 31.8*  PLT 184  --   --  201  --  192  LABPROT 14.7  --   --  14.6  --  15.9*  INR 1.16  --   --  1.14  --  1.28  HEPARINUNFRC  --    < > 0.41 0.43  --  0.42  CREATININE 0.90  --   --  0.99  --  1.06   < > = values in this interval not displayed.   Estimated Creatinine Clearance: 100.4 mL/min (by C-G formula based on SCr of 1.06 mg/dL).  Medical History: Past Medical History:  Diagnosis Date  . Ascending aortic dissection ([place])   . Diabetes mellitus without complication ([place])   . DVT (deep vein thrombosis) in pregnancy (HCC)    legs and lungs   . Hyperlipemia   . Hypertension   . Kidney stones    Medications:  Scheduled:  . acetaminophen  1,000 mg Oral Q6H  . allopurinol  100 mg Oral Q1500  . amLODipine  5 mg Oral QHS  . doxazosin  8 mg Oral QHS  . famotidine  10 mg Oral Daily  . glipiZIDE  10 mg Oral BID  . insulin aspart  0-15 Units Subcutaneous TID WC  . metoCLOPramide  5 mg Oral TID AC & HS  . metoprolol tartrate  100 mg Oral BID  . psyllium  1 packet Oral Daily  . saccharomyces boulardii  250 mg Oral BID  . simvastatin  10 mg Oral QPM  . spironolactone  25 mg Oral QHS  . Warfarin - Pharmacist Dosing  Inpatient   Does not apply q1800   Infusions:  . heparin 1,800 Units/hr (08/22/17 0600)   Assessment: 62 yo male with history of HTN, DM, HLD, aortic valve s/p replacement (on warfarin), aortic dissection admitted 8/15 for right hemicolectomy, colonoscopy, umbilical hernia repair. Anticoagulation has been on hold.  Today 8/17, POD2, starting IV heparin without a bolus and resume warfarin.  Warfarin dose PTA reported as 6mg  daily except 3mg  on Wednesdays; last dose was taken 8/10 in preparation for surgery.  He was bridged with Lovenox 150mg  SQ q12h with last dose taken 8/14.  Today, 08/22/2017  Heparin level remains therapeutic on heparin 1800 units/hr  INR subtherapeutic but starting to respond after warfarin doses of 5mg , 6mg  and 6mg   CBC: Hgb low but stable at 10.2, Plts wnl  Per Surgery note, blood has ceased being seen in bowel movement the last 24hr  Regular diet ordered  PRN ibuprofen ordered, can increase risk of bleeding  Goal of Therapy:  Heparin level 0.3-0.7 units/ml  INR 2-3 (confirmed with outpatient anticoag clinic notes) Monitor platelets  by anticoagulation protocol: Yes    Plan:   Continue Heparin infusion at 1800 units/hr  Repeat 6mg  warfarin today  Monitor closely for signs/symptoms of bleeding  Daily heparin level, CBC and PT/INR   Adrian Saran, PharmD, BCPS Pager [phone] 08/22/2017 10:28 AM

## 2017-08-22 NOTE — Progress Notes (Signed)
MD paged due to patient having an episode of a bloody bowel movement. Slight bright red blood noted in toilet and on toilet paper. No clots noted. Given verbal orders to place H&H for 1800. Will continue to monitor and await further orders.

## 2017-08-22 NOTE — Progress Notes (Signed)
Subjective Had 4 BMs in last 24hrs - no further dark red ones though, all brown and loose. Hgb stable. Denies abdominal pain. Denies n/v; early satiety better with reglan. Ate 2 chicken sandwiches yesterday and some grits. Ambulating. Denies any other complaints.  Objective: Vital signs in last 24 hours: Temp:  [98.3 F (36.8 C)-98.9 F (37.2 C)] 98.3 F (36.8 C) (08/20 0648) Pulse Rate:  [67-74] 71 (08/20 0648) Resp:  [17-18] 18 (08/20 0648) BP: (103-131)/(70-86) 131/86 (08/20 0648) SpO2:  [93 %-96 %] 93 % (08/20 0648) Last BM Date: 08/21/17  Intake/Output from previous day: 08/19 0701 - 08/20 0700 In: 1751.7 [P.O.:1320; I.V.:431.7] Out: 450 [Urine:450] Intake/Output this shift: No intake/output data recorded.  Gen: NAD, comfortable CV: RRR Pulm: Normal work of breathing Abd: Obese, soft, NT/ND. Incisions c/d/i without erythema, staples in place Ext: SCDs in place  Lab Results: CBC  Recent Labs    08/21/17 0456 08/21/17 1211 08/22/17 0535  WBC 6.4  --  6.3  HGB 10.3* 10.4* 10.2*  HCT 32.5* 32.8* 31.8*  PLT 201  --  192   BMET Recent Labs    08/21/17 0456 08/22/17 0535  NA 136 139  K 3.7 3.7  CL 101 103  CO2 26 27  GLUCOSE 205* 163*  BUN 14 14  CREATININE 0.99 1.06  CALCIUM 9.3 9.4   PT/INR Recent Labs    08/21/17 0456 08/22/17 0535  LABPROT 14.6 15.9*  INR 1.14 1.28   ABG No results for input(s): PHART, HCO3 in the last 72 hours.  Invalid input(s): PCO2, PO2  Studies/Results:  Anti-infectives: Anti-infectives (From admission, onward)   Start     Dose/Rate Route Frequency Ordered Stop   08/17/17 1400  neomycin (MYCIFRADIN) tablet 1,000 mg  Status:  Discontinued     1,000 mg Oral 3 times per day 08/17/17 0752 08/17/17 0802   08/17/17 1400  metroNIDAZOLE (FLAGYL) tablet 1,000 mg  Status:  Discontinued     1,000 mg Oral 3 times per day 08/17/17 0752 08/17/17 0802   08/17/17 0800  cefoTEtan (CEFOTAN) 2 g in sodium chloride 0.9 % 100 mL IVPB     2 g 200 mL/hr over 30 Minutes Intravenous On call to O.R. 08/17/17 0752 08/17/17 0949       Assessment/Plan: Patient Active Problem List   Diagnosis Date Noted  . Colon polyp 08/17/2017  . Anemia   . Polyp of sigmoid colon   . Benign neoplasm of descending colon   . Colon neoplasm   . Abdominal pain, epigastric   . Gastritis without bleeding   . Nephrolithiasis 11/06/2015  . Cough with sputum 01/14/2015  . Fatigue 01/14/2015  . Obesity 01/14/2015  . GERD (gastroesophageal reflux disease) 04/24/2014  . Long term current use of anticoagulant therapy 04/24/2014  . Type 2 diabetes mellitus with complication ([place]) 44/81/8563  . Ascending aortic dissection ([place]) 10/04/2013  . HTN (hypertension) 05/28/2013  . Pulmonary emboli ([place]) 06/26/2012  . Pulmonary edema 06/23/2012  . Respiratory failure with hypoxia ([place]) 06/23/2012  . Osteoarthritis of left hip 04/19/2012  . Right leg DVT ([place]) 06/17/2011   s/p Procedure(s): LAPAROSCOPIC  RIGHT HEMICOLECTOMY, COLONOSCOPY, BILATERAL TAP BLOCKS, UMBILICAL HERNIA REPAIR 1/49/7026  -Diarrhea expected after R hemicolectomy - afebrile and normal wbc. Starting metamucil today -Ambulate 5x/day -Hypomagnesemia - will replace with oral mag ox tabs today -Diabetic diet as tolerated -Continue sliding scale insulin + glyburide; reglan -PPx: Heparin gtt; SCDs, H2B -Dispo: Repeat INR and hgb tomorrow.   LOS: 5  days   Sharon Mt. Dema Severin, M.D. General and Colorectal Surgery [facility]

## 2017-08-23 LAB — CBC
HCT: 30.4 % — ABNORMAL LOW (ref 39.0–52.0)
HEMOGLOBIN: 9.7 g/dL — AB (ref 13.0–17.0)
MCH: 25.2 pg — ABNORMAL LOW (ref 26.0–34.0)
MCHC: 31.9 g/dL (ref 30.0–36.0)
MCV: 79 fL (ref 78.0–100.0)
Platelets: 195 10*3/uL (ref 150–400)
RBC: 3.85 MIL/uL — AB (ref 4.22–5.81)
RDW: 16.5 % — ABNORMAL HIGH (ref 11.5–15.5)
WBC: 5.6 10*3/uL (ref 4.0–10.5)

## 2017-08-23 LAB — HEPARIN LEVEL (UNFRACTIONATED): HEPARIN UNFRACTIONATED: 0.36 [IU]/mL (ref 0.30–0.70)

## 2017-08-23 LAB — GLUCOSE, CAPILLARY
GLUCOSE-CAPILLARY: 124 mg/dL — AB (ref 70–99)
GLUCOSE-CAPILLARY: 126 mg/dL — AB (ref 70–99)
Glucose-Capillary: 133 mg/dL — ABNORMAL HIGH (ref 70–99)

## 2017-08-23 LAB — PROTIME-INR
INR: 1.44
PROTHROMBIN TIME: 17.5 s — AB (ref 11.4–15.2)

## 2017-08-23 MED ORDER — WARFARIN SODIUM 7.5 MG PO TABS
7.5000 mg | ORAL_TABLET | Freq: Once | ORAL | Status: AC
  Administered 2017-08-23: 7.5 mg via ORAL
  Filled 2017-08-23: qty 1

## 2017-08-23 NOTE — Progress Notes (Signed)
Subjective Having BMs - blood tinged yesterday but hgb stable. Had another overnight that was brown with what he describes as minimal blood discoloration. Denies abdominal pain. Denies n/v; tolerating diet without n/v. Ambulating 3-4x/day. Denies any other complaints.  Objective: Vital signs in last 24 hours: Temp:  [98.2 F (36.8 C)-98.8 F (37.1 C)] 98.8 F (37.1 C) (08/21 0612) Pulse Rate:  [68-79] 79 (08/21 0612) Resp:  [14-18] 16 (08/21 0612) BP: (100-125)/(68-81) 114/76 (08/21 0612) SpO2:  [96 %-99 %] 96 % (08/21 0612) Weight:  [128.5 kg] 128.5 kg (08/21 0500) Last BM Date: 08/22/17  Intake/Output from previous day: 08/20 0701 - 08/21 0700 In: 1211.3 [P.O.:780; I.V.:431.3] Out: 1050 [Urine:1050] Intake/Output this shift: No intake/output data recorded.  Gen: NAD, comfortable CV: RRR Pulm: Normal work of breathing Abd: Obese, soft, NT/ND. Incisions c/d/i without erythema, staples in place Ext: SCDs in place  Lab Results: CBC  Recent Labs    08/22/17 0535 08/22/17 1752 08/23/17 0547  WBC 6.3  --  5.6  HGB 10.2* 10.0* 9.7*  HCT 31.8* 32.0* 30.4*  PLT 192  --  195   BMET Recent Labs    08/21/17 0456 08/22/17 0535  NA 136 139  K 3.7 3.7  CL 101 103  CO2 26 27  GLUCOSE 205* 163*  BUN 14 14  CREATININE 0.99 1.06  CALCIUM 9.3 9.4   PT/INR Recent Labs    08/22/17 0535 08/23/17 0547  LABPROT 15.9* 17.5*  INR 1.28 1.44   ABG No results for input(s): PHART, HCO3 in the last 72 hours.  Invalid input(s): PCO2, PO2  Studies/Results:  Anti-infectives: Anti-infectives (From admission, onward)   Start     Dose/Rate Route Frequency Ordered Stop   08/17/17 1400  neomycin (MYCIFRADIN) tablet 1,000 mg  Status:  Discontinued     1,000 mg Oral 3 times per day 08/17/17 0752 08/17/17 0802   08/17/17 1400  metroNIDAZOLE (FLAGYL) tablet 1,000 mg  Status:  Discontinued     1,000 mg Oral 3 times per day 08/17/17 0752 08/17/17 0802   08/17/17 0800  cefoTEtan  (CEFOTAN) 2 g in sodium chloride 0.9 % 100 mL IVPB     2 g 200 mL/hr over 30 Minutes Intravenous On call to O.R. 08/17/17 0752 08/17/17 0949       Assessment/Plan: Patient Active Problem List   Diagnosis Date Noted  . Colon polyp 08/17/2017  . Anemia   . Polyp of sigmoid colon   . Benign neoplasm of descending colon   . Colon neoplasm   . Abdominal pain, epigastric   . Gastritis without bleeding   . Nephrolithiasis 11/06/2015  . Cough with sputum 01/14/2015  . Fatigue 01/14/2015  . Obesity 01/14/2015  . GERD (gastroesophageal reflux disease) 04/24/2014  . Long term current use of anticoagulant therapy 04/24/2014  . Type 2 diabetes mellitus with complication ([place]) 28/41/3244  . Ascending aortic dissection ([place]) 10/04/2013  . HTN (hypertension) 05/28/2013  . Pulmonary emboli ([place]) 06/26/2012  . Pulmonary edema 06/23/2012  . Respiratory failure with hypoxia ([place]) 06/23/2012  . Osteoarthritis of left hip 04/19/2012  . Right leg DVT ([place]) 06/17/2011   s/p Procedure(s): LAPAROSCOPIC  RIGHT HEMICOLECTOMY, COLONOSCOPY, BILATERAL TAP BLOCKS, UMBILICAL HERNIA REPAIR 0/10/2723  -Continue metamucil -Ambulate 5x/day -Daily CBC -Diabetic diet as tolerated -Continue sliding scale insulin + glyburide; reglan -PPx: Heparin gtt; SCDs, H2B -Dispo: Repeat INR and hgb tomorrow.   LOS: 6 days   Sharon Mt. Dema Severin, M.D. General and Colorectal [facility]

## 2017-08-23 NOTE — Progress Notes (Signed)
[place] for Heparin/Warfarin Indication: History of DVT, aortic valve replacement  Allergies  Allergen Reactions  . Chlorthalidone Swelling    Testicular/Scrotum edema  . Hydralazine Other (See Comments)    GI Upset (intolerance)  . Lisinopril Swelling    Facial Edema (intolerance)   Patient Measurements: Height: 5\' 10"  (177.8 cm) Weight: 283 lb 3.2 oz (128.5 kg) IBW/kg (Calculated) : 73 Heparin Dosing Weight: 102.4  Vital Signs: Temp: 98.8 F (37.1 C) (08/21 0612) Temp Source: Oral (08/21 0612) BP: 114/76 (08/21 0612) Pulse Rate: 79 (08/21 0612)  Labs: Recent Labs    08/21/17 0456  08/22/17 0535 08/22/17 1752 08/23/17 0547  HGB 10.3*   < > 10.2* 10.0* 9.7*  HCT 32.5*   < > 31.8* 32.0* 30.4*  PLT 201  --  192  --  195  LABPROT 14.6  --  15.9*  --  17.5*  INR 1.14  --  1.28  --  1.44  HEPARINUNFRC 0.43  --  0.42  --  0.36  CREATININE 0.99  --  1.06  --   --    < > = values in this interval not displayed.   Estimated Creatinine Clearance: 98.5 mL/min (by C-G formula based on SCr of 1.06 mg/dL).  Medical History: Past Medical History:  Diagnosis Date  . Ascending aortic dissection ([place])   . Diabetes mellitus without complication ([place])   . DVT (deep vein thrombosis) in pregnancy (HCC)    legs and lungs   . Hyperlipemia   . Hypertension   . Kidney stones    Medications:  Scheduled:  . acetaminophen  1,000 mg Oral Q6H  . allopurinol  100 mg Oral Q1500  . amLODipine  5 mg Oral QHS  . doxazosin  8 mg Oral QHS  . famotidine  10 mg Oral Daily  . glipiZIDE  10 mg Oral BID  . insulin aspart  0-15 Units Subcutaneous TID WC  . metoCLOPramide  5 mg Oral TID AC & HS  . metoprolol tartrate  100 mg Oral BID  . psyllium  1 packet Oral Daily  . saccharomyces boulardii  250 mg Oral BID  . simvastatin  10 mg Oral QPM  . spironolactone  25 mg Oral QHS  . Warfarin - Pharmacist Dosing Inpatient   Does not apply q1800   Infusions:   . heparin 1,800 Units/hr (08/23/17 0600)   Assessment: 62 yo male with history of HTN, DM, HLD, aortic valve s/p replacement (on warfarin), aortic dissection admitted 8/15 for right hemicolectomy, colonoscopy, umbilical hernia repair. Anticoagulation has been on hold.  Today 8/17, POD2, starting IV heparin without a bolus and resume warfarin.  Warfarin dose PTA reported as 6mg  daily except 3mg  on Wednesdays; last dose was taken 8/10 in preparation for surgery.  He was bridged with Lovenox 150mg  SQ q12h with last dose taken 8/14.  Today, 08/23/2017  Heparin level remains therapeutic on heparin 1800 units/hr  INR subtherapeutic but starting to respond after warfarin doses of 5mg , 6mg , 6mg , and 6mg , but continues to rise slowly  CBC: Hgb low at 9.7, Plts wnl  Pt has blood in bowl movement yesterday, but appears to have improved since then per surgery note  Regular diet ordered  Goal of Therapy:  Heparin level 0.3-0.7 units/ml  INR 2-3 (confirmed with outpatient anticoag clinic notes) Monitor platelets by anticoagulation protocol: Yes    Plan:   Continue Heparin infusion at 1800 units/hr  Increase warfarin dose today to  7.5 mg  Monitor closely for signs/symptoms of bleeding  Daily heparin level, CBC and PT/INR   Simonne Come, PharmD Candidate

## 2017-08-23 NOTE — Progress Notes (Signed)
rm [place], 8/15 had R Hemicolectomy, Umbilicus hernia Repair. on Heparin gtt for hx Pulm Emboli.Tonight BP is 100/67 HR 71.  Notified Dr. Hassell Done orders to hold metoprolol as patient stated he does not take metoprolol at night.  It was held to night.

## 2017-08-24 LAB — CBC
HCT: 28.6 % — ABNORMAL LOW (ref 39.0–52.0)
HEMOGLOBIN: 9.1 g/dL — AB (ref 13.0–17.0)
MCH: 25.2 pg — ABNORMAL LOW (ref 26.0–34.0)
MCHC: 31.8 g/dL (ref 30.0–36.0)
MCV: 79.2 fL (ref 78.0–100.0)
Platelets: 196 10*3/uL (ref 150–400)
RBC: 3.61 MIL/uL — AB (ref 4.22–5.81)
RDW: 16.4 % — ABNORMAL HIGH (ref 11.5–15.5)
WBC: 5.5 10*3/uL (ref 4.0–10.5)

## 2017-08-24 LAB — GLUCOSE, CAPILLARY
GLUCOSE-CAPILLARY: 170 mg/dL — AB (ref 70–99)
GLUCOSE-CAPILLARY: 150 mg/dL — AB (ref 70–99)
Glucose-Capillary: 158 mg/dL — ABNORMAL HIGH (ref 70–99)
Glucose-Capillary: 144 mg/dL — ABNORMAL HIGH (ref 70–99)

## 2017-08-24 LAB — PROTIME-INR
INR: 1.74
PROTHROMBIN TIME: 20.2 s — AB (ref 11.4–15.2)

## 2017-08-24 LAB — HEPARIN LEVEL (UNFRACTIONATED): Heparin Unfractionated: 0.44 IU/mL (ref 0.30–0.70)

## 2017-08-24 MED ORDER — WARFARIN SODIUM 7.5 MG PO TABS
7.5000 mg | ORAL_TABLET | Freq: Once | ORAL | Status: AC
  Administered 2017-08-24: 7.5 mg via ORAL
  Filled 2017-08-24: qty 1

## 2017-08-24 NOTE — Care Management Important Message (Signed)
Important Message  Patient Details  Name: DEVARIUS NELLES MRN: [id number] Date of Birth: 1955-09-01   Medicare Important Message Given:  Yes    Kerin Salen 08/24/2017, 11:09 AMImportant Message  Patient Details  Name: SONU KRUCKENBERG MRN: [id number] Date of Birth: [birth year]   Medicare Important Message Given:  Yes    Kerin Salen 08/24/2017, 11:09 AM

## 2017-08-24 NOTE — Progress Notes (Signed)
[place] for Heparin/Warfarin Indication: History of DVT, aortic valve replacement  Allergies  Allergen Reactions  . Chlorthalidone Swelling    Testicular/Scrotum edema  . Hydralazine Other (See Comments)    GI Upset (intolerance)  . Lisinopril Swelling    Facial Edema (intolerance)   Patient Measurements: Height: 5\' 10"  (177.8 cm) Weight: 281 lb 3.2 oz (127.6 kg) IBW/kg (Calculated) : 73 Heparin Dosing Weight: 102.4  Vital Signs: Temp: 98.7 F (37.1 C) (08/22 0911) Temp Source: Oral (08/22 0911) BP: 113/63 (08/22 0911) Pulse Rate: 73 (08/22 0911)  Labs: Recent Labs    08/22/17 0535 08/22/17 1752 08/23/17 0547 08/24/17 0448  HGB 10.2* 10.0* 9.7* 9.1*  HCT 31.8* 32.0* 30.4* 28.6*  PLT 192  --  195 196  LABPROT 15.9*  --  17.5* 20.2*  INR 1.28  --  1.44 1.74  HEPARINUNFRC 0.42  --  0.36 0.44  CREATININE 1.06  --   --   --    Estimated Creatinine Clearance: 98.1 mL/min (by C-G formula based on SCr of 1.06 mg/dL).  Medical History: Past Medical History:  Diagnosis Date  . Ascending aortic dissection ([place])   . Diabetes mellitus without complication ([place])   . DVT (deep vein thrombosis) in pregnancy (HCC)    legs and lungs   . Hyperlipemia   . Hypertension   . Kidney stones    Medications:  Scheduled:  . acetaminophen  1,000 mg Oral Q6H  . allopurinol  100 mg Oral Q1500  . amLODipine  5 mg Oral QHS  . doxazosin  8 mg Oral QHS  . famotidine  10 mg Oral Daily  . glipiZIDE  10 mg Oral BID  . insulin aspart  0-15 Units Subcutaneous TID WC  . metoCLOPramide  5 mg Oral TID AC & HS  . metoprolol tartrate  100 mg Oral BID  . psyllium  1 packet Oral Daily  . saccharomyces boulardii  250 mg Oral BID  . simvastatin  10 mg Oral QPM  . spironolactone  25 mg Oral QHS  . Warfarin - Pharmacist Dosing Inpatient   Does not apply q1800   Infusions:   Assessment: 63 yo male with history of HTN, DM, HLD, aortic valve s/p  replacement (on warfarin), aortic dissection admitted 8/15 for right hemicolectomy, colonoscopy, umbilical hernia repair. Anticoagulation has been on hold.  Today 8/17, POD2, starting IV heparin without a bolus and resume warfarin.  Warfarin dose PTA reported as 6mg  daily except 3mg  on Wednesdays; last dose was taken 8/10 in preparation for surgery.  He was bridged with Lovenox 150mg  SQ q12h with last dose taken 8/14.  Today, 08/24/2017  Heparin level 0.44, remains therapeutic on heparin 1800 units/hr at the time when labs were drawn.  INR subtherapeutic but increased to 1.74.  Warfarin doses given:  5mg , 6mg  x3 days, then 7.5 mg   CBC: Hgb low/decreased to 9.1, Plts wnl  RN noted blood in BM on 8/21, BM this morning is described as having dark, old blood.  No bright red blood.  Per surgery note, NO active bleeding suspected.  Regular diet ordered  Goal of Therapy:  INR 2-3 Heparin level 0.3-0.7 units/ml  Monitor platelets by anticoagulation protocol: Yes    Plan:   Heparin infusion stopped at 10:23 on 8/22 per surgery d/t blood in BMs and Hgb drop.  Repeat Warfarin dose today 7.5 mg PO x1 dose  Per Surgery, OK to continue warfarin, but holding heparin gtt.  Monitor closely for signs/symptoms of bleeding  Daily heparin level, CBC and PT/INR  Gretta Arab PharmD, BCPS Pager [phone] 08/24/2017 11:30 AM

## 2017-08-24 NOTE — Progress Notes (Signed)
Subjective Having BMs - blood tinged yesterday - last BM was yesterday in early afternoon; nothing since.  Denies abdominal pain. Denies n/v; tolerating diet without n/v. Ambulating 3-4x/day. Denies any other complaints.  Objective: Vital signs in last 24 hours: Temp:  [98 F (36.7 C)-98.7 F (37.1 C)] 98.7 F (37.1 C) (08/22 0911) Pulse Rate:  [67-138] 73 (08/22 0911) Resp:  [18] 18 (08/22 0911) BP: (94-127)/(63-76) 113/63 (08/22 0911) SpO2:  [95 %-97 %] 95 % (08/22 0911) Weight:  [127.6 kg] 127.6 kg (08/22 0531) Last BM Date: 08/23/17  Intake/Output from previous day: 08/21 0701 - 08/22 0700 In: 1079.5 [P.O.:720; I.V.:359.5] Out: 1000 [Urine:1000] Intake/Output this shift: Total I/O In: 240 [P.O.:240] Out: 200 [Urine:200]  Gen: NAD, comfortable CV: RRR Pulm: Normal work of breathing Abd: Obese, soft, NT/ND. Incisions c/d/i without erythema, staples in place Ext: SCDs in place  Lab Results: CBC  Recent Labs    08/23/17 0547 08/24/17 0448  WBC 5.6 5.5  HGB 9.7* 9.1*  HCT 30.4* 28.6*  PLT 195 196   BMET Recent Labs    08/22/17 0535  NA 139  K 3.7  CL 103  CO2 27  GLUCOSE 163*  BUN 14  CREATININE 1.06  CALCIUM 9.4   PT/INR Recent Labs    08/23/17 0547 08/24/17 0448  LABPROT 17.5* 20.2*  INR 1.44 1.74   ABG No results for input(s): PHART, HCO3 in the last 72 hours.  Invalid input(s): PCO2, PO2  Studies/Results:  Anti-infectives: Anti-infectives (From admission, onward)   Start     Dose/Rate Route Frequency Ordered Stop   08/17/17 1400  neomycin (MYCIFRADIN) tablet 1,000 mg  Status:  Discontinued     1,000 mg Oral 3 times per day 08/17/17 0752 08/17/17 0802   08/17/17 1400  metroNIDAZOLE (FLAGYL) tablet 1,000 mg  Status:  Discontinued     1,000 mg Oral 3 times per day 08/17/17 0752 08/17/17 0802   08/17/17 0800  cefoTEtan (CEFOTAN) 2 g in sodium chloride 0.9 % 100 mL IVPB     2 g 200 mL/hr over 30 Minutes Intravenous On call to O.R. 08/17/17  0752 08/17/17 0949       Assessment/Plan: Patient Active Problem List   Diagnosis Date Noted  . Colon polyp 08/17/2017  . Anemia   . Polyp of sigmoid colon   . Benign neoplasm of descending colon   . Colon neoplasm   . Abdominal pain, epigastric   . Gastritis without bleeding   . Nephrolithiasis 11/06/2015  . Cough with sputum 01/14/2015  . Fatigue 01/14/2015  . Obesity 01/14/2015  . GERD (gastroesophageal reflux disease) 04/24/2014  . Long term current use of anticoagulant therapy 04/24/2014  . Type 2 diabetes mellitus with complication ([place]) 84/16/6063  . Ascending aortic dissection ([place]) 10/04/2013  . HTN (hypertension) 05/28/2013  . Pulmonary emboli ([place]) 06/26/2012  . Pulmonary edema 06/23/2012  . Respiratory failure with hypoxia ([place]) 06/23/2012  . Osteoarthritis of left hip 04/19/2012  . Right leg DVT ([place]) 06/17/2011   s/p Procedure(s): LAPAROSCOPIC  RIGHT HEMICOLECTOMY, COLONOSCOPY, BILATERAL TAP BLOCKS, UMBILICAL HERNIA REPAIR 0/16/0109  -Continue metamucil -Ambulate 5x/day -Daily CBC; holding heparin gtt today given hgb drop to 9 from 10 and some blood yesterday. If he was actively bleeding, would have expected multiple bloody BMs yesterday afternoon and overnight which was not the case. Will continue warfarin and daily cbc/trend -Diabetic diet as tolerated -Continue sliding scale insulin + glyburide; reglan -PPx: SCDs, H2B -Dispo: Repeat INR and hgb tomorrow.  LOS: 7 days   Sharon Mt. Dema Severin, M.D. General and Colorectal Surgery [facility]

## 2017-08-25 LAB — CBC
HCT: 28.5 % — ABNORMAL LOW (ref 39.0–52.0)
HEMOGLOBIN: 9 g/dL — AB (ref 13.0–17.0)
MCH: 25 pg — ABNORMAL LOW (ref 26.0–34.0)
MCHC: 31.6 g/dL (ref 30.0–36.0)
MCV: 79.2 fL (ref 78.0–100.0)
Platelets: 215 10*3/uL (ref 150–400)
RBC: 3.6 MIL/uL — AB (ref 4.22–5.81)
RDW: 16.2 % — ABNORMAL HIGH (ref 11.5–15.5)
WBC: 6.1 10*3/uL (ref 4.0–10.5)

## 2017-08-25 LAB — PROTIME-INR
INR: 1.94
PROTHROMBIN TIME: 22 s — AB (ref 11.4–15.2)

## 2017-08-25 LAB — GLUCOSE, CAPILLARY
GLUCOSE-CAPILLARY: 130 mg/dL — AB (ref 70–99)
GLUCOSE-CAPILLARY: 190 mg/dL — AB (ref 70–99)
GLUCOSE-CAPILLARY: 133 mg/dL — AB (ref 70–99)
GLUCOSE-CAPILLARY: 134 mg/dL — AB (ref 70–99)

## 2017-08-25 MED ORDER — WARFARIN SODIUM 7.5 MG PO TABS
7.5000 mg | ORAL_TABLET | Freq: Once | ORAL | Status: AC
  Administered 2017-08-25: 7.5 mg via ORAL
  Filled 2017-08-25: qty 1

## 2017-08-25 MED ORDER — TRAMADOL HCL 50 MG PO TABS: 50.0000 mg | ORAL_TABLET | Freq: Four times a day (QID) | ORAL | 0 refills | Status: AC | PRN

## 2017-08-25 NOTE — Progress Notes (Signed)
[place] for Warfarin Indication: History of DVT, aortic valve replacement  Allergies  Allergen Reactions  . Chlorthalidone Swelling    Testicular/Scrotum edema  . Hydralazine Other (See Comments)    GI Upset (intolerance)  . Lisinopril Swelling    Facial Edema (intolerance)   Patient Measurements: Height: 5\' 10"  (177.8 cm) Weight: 288 lb 12.8 oz (131 kg) IBW/kg (Calculated) : 73 Heparin Dosing Weight: 102.4  Vital Signs: Temp: 98.6 F (37 C) (08/23 0558) Temp Source: Oral (08/23 0558) BP: 105/68 (08/23 0558) Pulse Rate: 71 (08/23 0558)  Labs: Recent Labs    08/23/17 0547 08/24/17 0448 08/25/17 0411  HGB 9.7* 9.1* 9.0*  HCT 30.4* 28.6* 28.5*  PLT 195 196 215  LABPROT 17.5* 20.2* 22.0*  INR 1.44 1.74 1.94  HEPARINUNFRC 0.36 0.44  --    Estimated Creatinine Clearance: 99.6 mL/min (by C-G formula based on SCr of 1.06 mg/dL).  Medications:  Scheduled:  . allopurinol  100 mg Oral Q1500  . amLODipine  5 mg Oral QHS  . doxazosin  8 mg Oral QHS  . famotidine  10 mg Oral Daily  . glipiZIDE  10 mg Oral BID  . insulin aspart  0-15 Units Subcutaneous TID WC  . metoCLOPramide  5 mg Oral TID AC & HS  . metoprolol tartrate  100 mg Oral BID  . psyllium  1 packet Oral Daily  . saccharomyces boulardii  250 mg Oral BID  . simvastatin  10 mg Oral QPM  . spironolactone  25 mg Oral QHS  . Warfarin - Pharmacist Dosing Inpatient   Does not apply q1800   Infusions:   Assessment: 62 yo male with history of HTN, DM, HLD, aortic valve s/p replacement (on warfarin), aortic dissection admitted 8/15 for right hemicolectomy, colonoscopy, umbilical hernia repair. Anticoagulation has been on hold.  Pharmacy is consulted on 8/17, POD2, to start IV heparin without a bolus and resume warfarin.  Warfarin dose PTA reported as 6mg  daily except 3mg  on Wednesdays; last dose was taken 8/10 in preparation for surgery.  He was bridged with Lovenox 150mg  SQ  q12h with last dose taken 8/14.  Today, 08/25/2017  IR subtherapeutic but increased to 1.94  Warfarin doses given:  5mg , 6mg  x3 days, 7.5 mg x2 days  CBC: Hgb low/stable at 9, Plts wnl  RN noted blood in BM on 8/21; on 8/22 RN reports BM with dark, old blood in both AM and PM.  No BM yet on 8/23.  No bright red blood.  Per surgery note, NO active bleeding suspected.  Regular diet ordered  Goal of Therapy:  INR 2-3  Monitor platelets by anticoagulation protocol: Yes    Plan:   Repeat Warfarin dose today 7.5 mg PO x1 dose  Per Surgery, OK to continue warfarin, but holding heparin gtt.  Monitor closely for signs/symptoms of bleeding  Daily heparin level, CBC and PT/INR   Gretta Arab PharmD, BCPS Pager [phone] 08/25/2017 8:27 AM

## 2017-08-25 NOTE — Discharge Instructions (Addendum)
POST OP INSTRUCTIONS  1. DIET: As tolerated. Make sure you chew your food well and avoid raw fruits/vegetables if possible for the next 2-3 weeks.  2. Take your usually prescribed home medications unless otherwise directed.  3. PAIN CONTROL: a. Pain is best controlled by a usual combination of three different methods TOGETHER: i. Ice/Heat ii. Over the counter pain medication iii. Prescription pain medication b. Most patients will experience some swelling and bruising around the surgical site.  Ice packs or heating pads (30-60 minutes up to 6 times a day) will help. Some people prefer to use ice alone, heat alone, alternating between ice & heat.  Experiment to what works for you.  Swelling and bruising can take several weeks to resolve.   c. It is helpful to take an over-the-counter pain medication regularly for the first few weeks i. Acetaminophen (Tylenol) - you may take 650mg  every 6 hours as needed. You can take this with motrin as they act differently on the body. If you are taking a narcotic pain medication that has acetaminophen in it, do not take over the counter tylenol at the same time. A  prescription for pain medication should be given to you upon discharge.  Take your pain medication as prescribed if your pain is not adequatly controlled with the over-the-counter pain reliefs mentioned above.  4. Avoid getting constipated.  Between the surgery and the pain medications, it is common to experience some constipation.  Increasing fluid intake and taking a fiber supplement (such as Metamucil, Citrucel, FiberCon, MiraLax, etc) 1-2 times a day regularly will usually help prevent this problem from occurring.  A mild laxative (prune juice, Milk of Magnesia, MiraLax, etc) should be taken according to package directions if there are no bowel movements after 48 hours.    5. Dressing: You may bathe normally with soap/water over wounds. Your staples will be removed this week  In the office by one of  our nurses. They should be contacting you with this appointment information. I will see you back 1-2 weeks later for follow-up as well.  6. ACTIVITIES as tolerated:   a. Avoid heavy lifting (>10lbs or 1 gallon of milk) for the next 6 weeks. b. You may resume regular (light) daily activities beginning the next day--such as daily self-care, walking, climbing stairs--gradually increasing activities as tolerated.  If you can walk 30 minutes without difficulty, it is safe to try more intense activity such as jogging, treadmill, bicycling, low-impact aerobics.  c. DO NOT PUSH THROUGH PAIN.  Let pain be your guide: If it hurts to do something, don't do it. d. Dennis Bast may drive when you are no longer taking prescription pain medication, you can comfortably wear a seatbelt, and you can safely maneuver your car and apply brakes. e. Dennis Bast may have sexual intercourse when it is comfortable.   7. FOLLOW UP in our office a. Please call [facility] at [phone] to set up an appointment to see your surgeon in the office for a follow-up appointment approximately 2 weeks after your surgery. b. Make sure that you call for this appointment the day you arrive home to insure a convenient appointment time. c. You have an appt this Wednesday, 8/28, for staple removal.  Call the office to confirm the appt.  9. If you have disability or family leave forms that need to be completed, you may have them completed by your primary care physician's office; for return to work instructions, please ask our office staff and they will  be happy to assist you in obtaining this documentation   When to call us [phone]: 1. Poor pain control 2. Reactions / problems with new medications (rash/itching, etc)  3. Fever over 101.5 F (38.5 C) 4. Inability to urinate 5. Nausea/vomiting 6. Worsening swelling or bruising 7. Continued bleeding from incision. 8. Increased pain, redness, or drainage from the incision  The clinic staff is available  to answer your questions during regular business hours (8:30am-5pm).  Please dont hesitate to call and ask to speak to one of our nurses for clinical concerns.   A surgeon from [facility] is always on call at the hospitals   If you have a medical emergency, go to the nearest emergency room or call 911.  [facility], [place] [address] [place], [place], [place]  [address] MAIN: [phone] FAX: [phone] [email or web address]

## 2017-08-25 NOTE — Progress Notes (Signed)
Subjective BM yesterday with less blood, appears to have slowed/stopped. Denies abdominal pain. Denies n/v; tolerating diet. Ambulating 3-4x/day. Denies any other complaints.  Objective: Vital signs in last 24 hours: Temp:  [98.3 F (36.8 C)-98.7 F (37.1 C)] 98.6 F (37 C) (08/23 0558) Pulse Rate:  [71-76] 71 (08/23 0558) Resp:  [16-18] 18 (08/23 0558) BP: (105-130)/(63-75) 105/68 (08/23 0558) SpO2:  [94 %-97 %] 94 % (08/23 0558) Weight:  [131 kg] 131 kg (08/23 0558) Last BM Date: 08/24/17  Intake/Output from previous day: 08/22 0701 - 08/23 0700 In: 1320 [P.O.:1320] Out: 875 [Urine:875] Intake/Output this shift: No intake/output data recorded.  Gen: NAD, comfortable CV: RRR Pulm: Normal work of breathing Abd: Obese, soft, NT/ND. Incisions c/d/i without erythema, staples in place Ext: SCDs in place  Lab Results: CBC  Recent Labs    08/24/17 0448 08/25/17 0411  WBC 5.5 6.1  HGB 9.1* 9.0*  HCT 28.6* 28.5*  PLT 196 215   BMET No results for input(s): NA, K, CL, CO2, GLUCOSE, BUN, CREATININE, CALCIUM in the last 72 hours. PT/INR Recent Labs    08/24/17 0448 08/25/17 0411  LABPROT 20.2* 22.0*  INR 1.74 1.94   ABG No results for input(s): PHART, HCO3 in the last 72 hours.  Invalid input(s): PCO2, PO2  Studies/Results:  Anti-infectives: Anti-infectives (From admission, onward)   Start     Dose/Rate Route Frequency Ordered Stop   08/17/17 1400  neomycin (MYCIFRADIN) tablet 1,000 mg  Status:  Discontinued     1,000 mg Oral 3 times per day 08/17/17 0752 08/17/17 0802   08/17/17 1400  metroNIDAZOLE (FLAGYL) tablet 1,000 mg  Status:  Discontinued     1,000 mg Oral 3 times per day 08/17/17 0752 08/17/17 0802   08/17/17 0800  cefoTEtan (CEFOTAN) 2 g in sodium chloride 0.9 % 100 mL IVPB     2 g 200 mL/hr over 30 Minutes Intravenous On call to O.R. 08/17/17 0752 08/17/17 0949       Assessment/Plan: Patient Active Problem List   Diagnosis Date Noted  . Colon  polyp 08/17/2017  . Anemia   . Polyp of sigmoid colon   . Benign neoplasm of descending colon   . Colon neoplasm   . Abdominal pain, epigastric   . Gastritis without bleeding   . Nephrolithiasis 11/06/2015  . Cough with sputum 01/14/2015  . Fatigue 01/14/2015  . Obesity 01/14/2015  . GERD (gastroesophageal reflux disease) 04/24/2014  . Long term current use of anticoagulant therapy 04/24/2014  . Type 2 diabetes mellitus with complication ([place]) 38/88/7579  . Ascending aortic dissection ([place]) 10/04/2013  . HTN (hypertension) 05/28/2013  . Pulmonary emboli ([place]) 06/26/2012  . Pulmonary edema 06/23/2012  . Respiratory failure with hypoxia ([place]) 06/23/2012  . Osteoarthritis of left hip 04/19/2012  . 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 Bloody BMs have almost resolved now that heparin gtt is off. Will continue warfarin, repeat CBC/INR tomorrow. If near therapeutic, will plan discharge home assuming no further bloody BMs -Diabetic diet as tolerated -Continue sliding scale insulin + glyburide; reglan -PPx: SCDs, H2B   LOS: 8 days   Sharon Mt. Dema Severin, M.D. General and Colorectal Surgery [facility]

## 2017-08-26 LAB — PROTIME-INR
INR: 1.95
PROTHROMBIN TIME: 22.1 s — AB (ref 11.4–15.2)

## 2017-08-26 LAB — GLUCOSE, CAPILLARY
Glucose-Capillary: 111 mg/dL — ABNORMAL HIGH (ref 70–99)
Glucose-Capillary: 173 mg/dL — ABNORMAL HIGH (ref 70–99)

## 2017-08-26 LAB — CBC
HCT: 30.4 % — ABNORMAL LOW (ref 39.0–52.0)
HEMOGLOBIN: 9.4 g/dL — AB (ref 13.0–17.0)
MCH: 24.6 pg — AB (ref 26.0–34.0)
MCHC: 30.9 g/dL (ref 30.0–36.0)
MCV: 79.6 fL (ref 78.0–100.0)
Platelets: 215 10*3/uL (ref 150–400)
RBC: 3.82 MIL/uL — ABNORMAL LOW (ref 4.22–5.81)
RDW: 16.4 % — AB (ref 11.5–15.5)
WBC: 6.3 10*3/uL (ref 4.0–10.5)

## 2017-08-26 MED ORDER — WARFARIN SODIUM 7.5 MG PO TABS
7.5000 mg | ORAL_TABLET | Freq: Once | ORAL | Status: DC
  Filled 2017-08-26: qty 1

## 2017-08-26 NOTE — Progress Notes (Signed)
[place] for Warfarin Indication: History of DVT, aortic valve replacement  Allergies  Allergen Reactions  . Chlorthalidone Swelling    Testicular/Scrotum edema  . Hydralazine Other (See Comments)    GI Upset (intolerance)  . Lisinopril Swelling    Facial Edema (intolerance)   Patient Measurements: Height: 5\' 10"  (177.8 cm) Weight: 288 lb 12.8 oz (131 kg) IBW/kg (Calculated) : 73 Heparin Dosing Weight: 102.4  Vital Signs: Temp: 98.1 F (36.7 C) (08/24 0643) Temp Source: Oral (08/24 0643) BP: 106/64 (08/24 0643) Pulse Rate: 72 (08/24 0643)  Labs: Recent Labs    08/24/17 0448 08/25/17 0411 08/26/17 0522  HGB 9.1* 9.0* 9.4*  HCT 28.6* 28.5* 30.4*  PLT 196 215 215  LABPROT 20.2* 22.0* 22.1*  INR 1.74 1.94 1.95  HEPARINUNFRC 0.44  --   --    Estimated Creatinine Clearance: 99.6 mL/min (by C-G formula based on SCr of 1.06 mg/dL).  Medications:  Scheduled:  . allopurinol  100 mg Oral Q1500  . amLODipine  5 mg Oral QHS  . doxazosin  8 mg Oral QHS  . famotidine  10 mg Oral Daily  . glipiZIDE  10 mg Oral BID  . insulin aspart  0-15 Units Subcutaneous TID WC  . metoCLOPramide  5 mg Oral TID AC & HS  . metoprolol tartrate  100 mg Oral BID  . psyllium  1 packet Oral Daily  . saccharomyces boulardii  250 mg Oral BID  . simvastatin  10 mg Oral QPM  . spironolactone  25 mg Oral QHS  . Warfarin - Pharmacist Dosing Inpatient   Does not apply q1800   Infusions:   Assessment: 62 yo male with history of HTN, DM, HLD, aortic valve s/p replacement (on warfarin), aortic dissection admitted 8/15 for right hemicolectomy, colonoscopy, umbilical hernia repair. Anticoagulation has been on hold.  Pharmacy is consulted on 8/17, POD2, to start IV heparin without a bolus and resume warfarin.  Warfarin dose PTA reported as 6mg  daily except 3mg  on Wednesdays; last dose was taken 8/10 in preparation for surgery.  He was bridged with Lovenox 150mg  SQ  q12h with last dose taken 8/14.  Today, 08/26/2017 INR remains slightly subtherapeutic at 1.95, may require higher weekly dose than PTA. H/H low but remains stable, plts wnl, and no bleeding or recent bloody BM reported at this time.    Goal of Therapy:  INR 2-3  Monitor platelets by anticoagulation protocol: Yes    Plan:  Warfarin 7.5mg  PO x 1 tonight Daily INR, CBC, s/s bleeding  Bertis Ruddy, PharmD Clinical Pharmacist Please check AMION for all [place] numbers 08/26/2017 8:53 AM

## 2017-08-26 NOTE — Plan of Care (Signed)
Reviewed discharge instructions. IV removed. Pt ready for discharge.

## 2017-08-26 NOTE — Discharge Summary (Signed)
Physician Discharge Summary  Patient ID:  Shane Bean  MRN: [id number]  DOB/AGE: 05-15-1955 62 y.o.  Admit date: 08/17/2017 Discharge date: 08/26/2017  Discharge Diagnoses:  1.  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  HTN 3.  DM 4.  Mechanical heart valve - [facility] - 2015             Anticoagulated - Coumadin - PT/INR - 22.1/1.95  He has follow up this Wednesday, 8/28, with Coumadin clinic 5.  Hgb - 9.4 - 08/26/2017   Active Problems:   Colon polyp  Operation: Procedure(s):  LAPAROSCOPIC  RIGHT HEMI COLECTOMY, COLONOSCOPY, UMBILICAL HERNIA REPAIR on 08/17/2017 - C. White  Discharged Condition: good  Hospital Course: TAAVI HOOSE is an 62 y.o. male whose primary care physician is Janyth Pupa, NP and who was admitted 08/17/2017 with a chief complaint of right colon polyp that is not amendable to colonic resection.   He was brought to the operating room on 08/17/2017 and underwent LAPAROSCOPIC  RIGHT HEMI COLECTOMY, COLONOSCOPY, UMBILICAL HERNIA REPAIR.   Hx of HTN, DM and mechanical aortic valve. On anticoagulation - had issues preop with spontaneous bleeding on Lovenox (150mg  BID) so was bridged postop on heparin. He developed some bloody stools on heparin while awaiting therapeutic INR for warfarin. Dr. Dema Severin stopped his heparin 8/22 for this reason and the bleeding stopped. Hgb has been relatively stable, dropped 1-1.5 pts but nothing too major.  He has otherwise done well post op.  His INR is at therapeutic level, he is tolerating a diet, and he is ready to go home.  The discharge instructions were reviewed with the patient.  Consults: None  Significant Diagnostic Studies: Results for orders placed or performed during the hospital encounter of 08/17/17  Glucose, capillary  Result Value Ref Range   Glucose-Capillary 249 (H) 70 - 99 mg/dL   Comment 1 Notify RN    Comment 2 Document in Chart   Protime-INR  Result Value Ref Range    Prothrombin Time 16.4 (H) 11.4 - 15.2 seconds   INR 1.33   Glucose, capillary  Result Value Ref Range   Glucose-Capillary 271 (H) 70 - 99 mg/dL   Comment 1 Document in Chart    Comment 2 Call MD NNP PA CNM   Glucose, capillary  Result Value Ref Range   Glucose-Capillary 266 (H) 70 - 99 mg/dL  CBC  Result Value Ref Range   WBC 9.4 4.0 - 10.5 K/uL   RBC 4.47 4.22 - 5.81 MIL/uL   Hemoglobin 11.2 (L) 13.0 - 17.0 g/dL   HCT 35.2 (L) 39.0 - 52.0 %   MCV 78.7 78.0 - 100.0 fL   MCH 25.1 (L) 26.0 - 34.0 pg   MCHC 31.8 30.0 - 36.0 g/dL   RDW 16.4 (H) 11.5 - 15.5 %   Platelets 170 150 - 400 K/uL  Basic metabolic panel  Result Value Ref Range   Sodium 139 135 - 145 mmol/L   Potassium 3.7 3.5 - 5.1 mmol/L   Chloride 106 98 - 111 mmol/L   CO2 26 22 - 32 mmol/L   Glucose, Bld 155 (H) 70 - 99 mg/dL   BUN 9 8 - 23 mg/dL   Creatinine, Ser 1.06 0.61 - 1.24 mg/dL   Calcium 9.3 8.9 - 10.3 mg/dL   GFR calc non Af Amer >60 >60 mL/min   GFR calc Af Amer >60 >60 mL/min   Anion gap 7 5 - 15  Magnesium  Result Value Ref Range   Magnesium 1.7 1.7 - 2.4 mg/dL  Phosphorus  Result Value Ref Range   Phosphorus 3.2 2.5 - 4.6 mg/dL  Glucose, capillary  Result Value Ref Range   Glucose-Capillary 249 (H) 70 - 99 mg/dL  Glucose, capillary  Result Value Ref Range   Glucose-Capillary 162 (H) 70 - 99 mg/dL  Glucose, capillary  Result Value Ref Range   Glucose-Capillary 203 (H) 70 - 99 mg/dL  Glucose, capillary  Result Value Ref Range   Glucose-Capillary 183 (H) 70 - 99 mg/dL  CBC  Result Value Ref Range   WBC 7.8 4.0 - 10.5 K/uL   RBC 4.33 4.22 - 5.81 MIL/uL   Hemoglobin 10.7 (L) 13.0 - 17.0 g/dL   HCT 34.3 (L) 39.0 - 52.0 %   MCV 79.2 78.0 - 100.0 fL   MCH 24.7 (L) 26.0 - 34.0 pg   MCHC 31.2 30.0 - 36.0 g/dL   RDW 16.6 (H) 11.5 - 15.5 %   Platelets 166 150 - 400 K/uL  Basic metabolic panel  Result Value Ref Range   Sodium 138 135 - 145 mmol/L   Potassium 3.7 3.5 - 5.1 mmol/L   Chloride  106 98 - 111 mmol/L   CO2 27 22 - 32 mmol/L   Glucose, Bld 139 (H) 70 - 99 mg/dL   BUN 10 8 - 23 mg/dL   Creatinine, Ser 0.83 0.61 - 1.24 mg/dL   Calcium 9.4 8.9 - 10.3 mg/dL   GFR calc non Af Amer >60 >60 mL/min   GFR calc Af Amer >60 >60 mL/min   Anion gap 5 5 - 15  Magnesium  Result Value Ref Range   Magnesium 1.9 1.7 - 2.4 mg/dL  Phosphorus  Result Value Ref Range   Phosphorus 3.5 2.5 - 4.6 mg/dL  Glucose, capillary  Result Value Ref Range   Glucose-Capillary 200 (H) 70 - 99 mg/dL  Glucose, capillary  Result Value Ref Range   Glucose-Capillary 164 (H) 70 - 99 mg/dL  Heparin level (unfractionated)  Result Value Ref Range   Heparin Unfractionated 0.15 (L) 0.30 - 0.70 IU/mL  Glucose, capillary  Result Value Ref Range   Glucose-Capillary 181 (H) 70 - 99 mg/dL  Glucose, capillary  Result Value Ref Range   Glucose-Capillary 204 (H) 70 - 99 mg/dL  Basic metabolic panel  Result Value Ref Range   Sodium 137 135 - 145 mmol/L   Potassium 3.7 3.5 - 5.1 mmol/L   Chloride 103 98 - 111 mmol/L   CO2 28 22 - 32 mmol/L   Glucose, Bld 159 (H) 70 - 99 mg/dL   BUN 11 8 - 23 mg/dL   Creatinine, Ser 0.90 0.61 - 1.24 mg/dL   Calcium 8.8 (L) 8.9 - 10.3 mg/dL   GFR calc non Af Amer >60 >60 mL/min   GFR calc Af Amer >60 >60 mL/min   Anion gap 6 5 - 15  Magnesium  Result Value Ref Range   Magnesium 1.8 1.7 - 2.4 mg/dL  Phosphorus  Result Value Ref Range   Phosphorus 3.4 2.5 - 4.6 mg/dL  CBC  Result Value Ref Range   WBC 7.3 4.0 - 10.5 K/uL   RBC 4.17 (L) 4.22 - 5.81 MIL/uL   Hemoglobin 10.5 (L) 13.0 - 17.0 g/dL   HCT 33.1 (L) 39.0 - 52.0 %   MCV 79.4 78.0 - 100.0 fL   MCH 25.2 (L) 26.0 - 34.0 pg   MCHC 31.7 30.0 -  36.0 g/dL   RDW 16.6 (H) 11.5 - 15.5 %   Platelets 184 150 - 400 K/uL  Protime-INR  Result Value Ref Range   Prothrombin Time 14.7 11.4 - 15.2 seconds   INR 1.16   Glucose, capillary  Result Value Ref Range   Glucose-Capillary 159 (H) 70 - 99 mg/dL  Heparin level  (unfractionated)  Result Value Ref Range   Heparin Unfractionated 0.19 (L) 0.30 - 0.70 IU/mL  Heparin level (unfractionated)  Result Value Ref Range   Heparin Unfractionated 0.35 0.30 - 0.70 IU/mL  Glucose, capillary  Result Value Ref Range   Glucose-Capillary 205 (H) 70 - 99 mg/dL  Glucose, capillary  Result Value Ref Range   Glucose-Capillary 163 (H) 70 - 99 mg/dL  Heparin level (unfractionated)  Result Value Ref Range   Heparin Unfractionated 0.41 0.30 - 0.70 IU/mL  Basic metabolic panel  Result Value Ref Range   Sodium 136 135 - 145 mmol/L   Potassium 3.7 3.5 - 5.1 mmol/L   Chloride 101 98 - 111 mmol/L   CO2 26 22 - 32 mmol/L   Glucose, Bld 205 (H) 70 - 99 mg/dL   BUN 14 8 - 23 mg/dL   Creatinine, Ser 0.99 0.61 - 1.24 mg/dL   Calcium 9.3 8.9 - 10.3 mg/dL   GFR calc non Af Amer >60 >60 mL/min   GFR calc Af Amer >60 >60 mL/min   Anion gap 9 5 - 15  Magnesium  Result Value Ref Range   Magnesium 1.8 1.7 - 2.4 mg/dL  Phosphorus  Result Value Ref Range   Phosphorus 3.3 2.5 - 4.6 mg/dL  CBC  Result Value Ref Range   WBC 6.4 4.0 - 10.5 K/uL   RBC 4.11 (L) 4.22 - 5.81 MIL/uL   Hemoglobin 10.3 (L) 13.0 - 17.0 g/dL   HCT 32.5 (L) 39.0 - 52.0 %   MCV 79.1 78.0 - 100.0 fL   MCH 25.1 (L) 26.0 - 34.0 pg   MCHC 31.7 30.0 - 36.0 g/dL   RDW 16.4 (H) 11.5 - 15.5 %   Platelets 201 150 - 400 K/uL  Protime-INR  Result Value Ref Range   Prothrombin Time 14.6 11.4 - 15.2 seconds   INR 1.14   Heparin level (unfractionated)  Result Value Ref Range   Heparin Unfractionated 0.43 0.30 - 0.70 IU/mL  Glucose, capillary  Result Value Ref Range   Glucose-Capillary 197 (H) 70 - 99 mg/dL  Glucose, capillary  Result Value Ref Range   Glucose-Capillary 218 (H) 70 - 99 mg/dL  Glucose, capillary  Result Value Ref Range   Glucose-Capillary 133 (H) 70 - 99 mg/dL  Hemoglobin and hematocrit, blood  Result Value Ref Range   Hemoglobin 10.4 (L) 13.0 - 17.0 g/dL   HCT 32.8 (L) 39.0 - 52.0 %   Glucose, capillary  Result Value Ref Range   Glucose-Capillary 159 (H) 70 - 99 mg/dL  Basic metabolic panel  Result Value Ref Range   Sodium 139 135 - 145 mmol/L   Potassium 3.7 3.5 - 5.1 mmol/L   Chloride 103 98 - 111 mmol/L   CO2 27 22 - 32 mmol/L   Glucose, Bld 163 (H) 70 - 99 mg/dL   BUN 14 8 - 23 mg/dL   Creatinine, Ser 1.06 0.61 - 1.24 mg/dL   Calcium 9.4 8.9 - 10.3 mg/dL   GFR calc non Af Amer >60 >60 mL/min   GFR calc Af Amer >60 >60 mL/min   Anion gap 9  5 - 15  Magnesium  Result Value Ref Range   Magnesium 1.7 1.7 - 2.4 mg/dL  Phosphorus  Result Value Ref Range   Phosphorus 4.0 2.5 - 4.6 mg/dL  CBC  Result Value Ref Range   WBC 6.3 4.0 - 10.5 K/uL   RBC 4.02 (L) 4.22 - 5.81 MIL/uL   Hemoglobin 10.2 (L) 13.0 - 17.0 g/dL   HCT 31.8 (L) 39.0 - 52.0 %   MCV 79.1 78.0 - 100.0 fL   MCH 25.4 (L) 26.0 - 34.0 pg   MCHC 32.1 30.0 - 36.0 g/dL   RDW 16.4 (H) 11.5 - 15.5 %   Platelets 192 150 - 400 K/uL  Protime-INR  Result Value Ref Range   Prothrombin Time 15.9 (H) 11.4 - 15.2 seconds   INR 1.28   Heparin level (unfractionated)  Result Value Ref Range   Heparin Unfractionated 0.42 0.30 - 0.70 IU/mL  Glucose, capillary  Result Value Ref Range   Glucose-Capillary 129 (H) 70 - 99 mg/dL  Glucose, capillary  Result Value Ref Range   Glucose-Capillary 138 (H) 70 - 99 mg/dL  Glucose, capillary  Result Value Ref Range   Glucose-Capillary 128 (H) 70 - 99 mg/dL  Glucose, capillary  Result Value Ref Range   Glucose-Capillary 144 (H) 70 - 99 mg/dL  Glucose, capillary  Result Value Ref Range   Glucose-Capillary 151 (H) 70 - 99 mg/dL  Glucose, capillary  Result Value Ref Range   Glucose-Capillary 200 (H) 70 - 99 mg/dL  Hemoglobin and hematocrit, blood  Result Value Ref Range   Hemoglobin 10.0 (L) 13.0 - 17.0 g/dL   HCT 32.0 (L) 39.0 - 52.0 %  CBC  Result Value Ref Range   WBC 5.6 4.0 - 10.5 K/uL   RBC 3.85 (L) 4.22 - 5.81 MIL/uL   Hemoglobin 9.7 (L) 13.0 - 17.0  g/dL   HCT 30.4 (L) 39.0 - 52.0 %   MCV 79.0 78.0 - 100.0 fL   MCH 25.2 (L) 26.0 - 34.0 pg   MCHC 31.9 30.0 - 36.0 g/dL   RDW 16.5 (H) 11.5 - 15.5 %   Platelets 195 150 - 400 K/uL  Protime-INR  Result Value Ref Range   Prothrombin Time 17.5 (H) 11.4 - 15.2 seconds   INR 1.44   Heparin level (unfractionated)  Result Value Ref Range   Heparin Unfractionated 0.36 0.30 - 0.70 IU/mL  Glucose, capillary  Result Value Ref Range   Glucose-Capillary 128 (H) 70 - 99 mg/dL  Glucose, capillary  Result Value Ref Range   Glucose-Capillary 172 (H) 70 - 99 mg/dL  Glucose, capillary  Result Value Ref Range   Glucose-Capillary 124 (H) 70 - 99 mg/dL  Glucose, capillary  Result Value Ref Range   Glucose-Capillary 126 (H) 70 - 99 mg/dL  Glucose, capillary  Result Value Ref Range   Glucose-Capillary 133 (H) 70 - 99 mg/dL  CBC  Result Value Ref Range   WBC 5.5 4.0 - 10.5 K/uL   RBC 3.61 (L) 4.22 - 5.81 MIL/uL   Hemoglobin 9.1 (L) 13.0 - 17.0 g/dL   HCT 28.6 (L) 39.0 - 52.0 %   MCV 79.2 78.0 - 100.0 fL   MCH 25.2 (L) 26.0 - 34.0 pg   MCHC 31.8 30.0 - 36.0 g/dL   RDW 16.4 (H) 11.5 - 15.5 %   Platelets 196 150 - 400 K/uL  Protime-INR  Result Value Ref Range   Prothrombin Time 20.2 (H) 11.4 - 15.2 seconds  INR 1.74   Heparin level (unfractionated)  Result Value Ref Range   Heparin Unfractionated 0.44 0.30 - 0.70 IU/mL  Glucose, capillary  Result Value Ref Range   Glucose-Capillary 170 (H) 70 - 99 mg/dL  Glucose, capillary  Result Value Ref Range   Glucose-Capillary 150 (H) 70 - 99 mg/dL  Glucose, capillary  Result Value Ref Range   Glucose-Capillary 158 (H) 70 - 99 mg/dL  CBC  Result Value Ref Range   WBC 6.1 4.0 - 10.5 K/uL   RBC 3.60 (L) 4.22 - 5.81 MIL/uL   Hemoglobin 9.0 (L) 13.0 - 17.0 g/dL   HCT 28.5 (L) 39.0 - 52.0 %   MCV 79.2 78.0 - 100.0 fL   MCH 25.0 (L) 26.0 - 34.0 pg   MCHC 31.6 30.0 - 36.0 g/dL   RDW 16.2 (H) 11.5 - 15.5 %   Platelets 215 150 - 400 K/uL   Protime-INR  Result Value Ref Range   Prothrombin Time 22.0 (H) 11.4 - 15.2 seconds   INR 1.94   Glucose, capillary  Result Value Ref Range   Glucose-Capillary 144 (H) 70 - 99 mg/dL  Glucose, capillary  Result Value Ref Range   Glucose-Capillary 130 (H) 70 - 99 mg/dL  Glucose, capillary  Result Value Ref Range   Glucose-Capillary 190 (H) 70 - 99 mg/dL  Glucose, capillary  Result Value Ref Range   Glucose-Capillary 133 (H) 70 - 99 mg/dL  CBC  Result Value Ref Range   WBC 6.3 4.0 - 10.5 K/uL   RBC 3.82 (L) 4.22 - 5.81 MIL/uL   Hemoglobin 9.4 (L) 13.0 - 17.0 g/dL   HCT 30.4 (L) 39.0 - 52.0 %   MCV 79.6 78.0 - 100.0 fL   MCH 24.6 (L) 26.0 - 34.0 pg   MCHC 30.9 30.0 - 36.0 g/dL   RDW 16.4 (H) 11.5 - 15.5 %   Platelets 215 150 - 400 K/uL  Protime-INR  Result Value Ref Range   Prothrombin Time 22.1 (H) 11.4 - 15.2 seconds   INR 1.95   Glucose, capillary  Result Value Ref Range   Glucose-Capillary 134 (H) 70 - 99 mg/dL  Glucose, capillary  Result Value Ref Range   Glucose-Capillary 111 (H) 70 - 99 mg/dL    No results found.  Discharge Exam:  Vitals:   08/25/17 2141 08/26/17 0643  BP: 132/84 106/64  Pulse: 69 72  Resp: 16 18  Temp: 99.2 F (37.3 C) 98.1 F (36.7 C)  SpO2: 96% 100%    General: Obese AA M who is alert and generally healthy appearing.  Lungs: Clear to auscultation and symmetric breath sounds. Heart:  RRR. No murmur or rub. Abdomen: Soft. No hernia. Normal bowel sounds.  He has BS.  His incisions look good.  Will remove the single staples prior to discharge.  Discharge Medications:   Allergies as of 08/26/2017      Reactions   Chlorthalidone Swelling   Testicular/Scrotum edema   Hydralazine Other (See Comments)   GI Upset (intolerance)   Lisinopril Swelling   Facial Edema (intolerance)      Medication List    STOP taking these medications   amoxicillin 500 MG capsule Commonly known as:  AMOXIL   clarithromycin 500 MG tablet Commonly  known as:  BIAXIN   enoxaparin 150 MG/ML injection Commonly known as:  LOVENOX   metroNIDAZOLE 500 MG tablet Commonly known as:  FLAGYL   neomycin 500 MG tablet Commonly known as:  MYCIFRADIN  TAKE these medications   allopurinol 100 MG tablet Commonly known as:  ZYLOPRIM Take 100 mg by mouth daily at 3 pm.   amLODipine 10 MG tablet Commonly known as:  NORVASC Take 10 mg by mouth at bedtime.   aspirin EC 325 MG tablet Take 162.5 mg by mouth daily.   doxazosin 8 MG tablet Commonly known as:  CARDURA Take 8 mg by mouth at bedtime.   glipiZIDE 5 MG tablet Commonly known as:  GLUCOTROL Take 10 mg by mouth 2 (two) times daily.   metoprolol tartrate 100 MG tablet Commonly known as:  LOPRESSOR Take 100 mg by mouth 2 (two) times daily.   ranitidine 150 MG capsule Commonly known as:  ZANTAC Take 150 mg by mouth daily at 3 pm.   simvastatin 10 MG tablet Commonly known as:  ZOCOR Take 10 mg by mouth every evening.   spironolactone 25 MG tablet Commonly known as:  ALDACTONE Take 25 mg by mouth at bedtime.   traMADol 50 MG tablet Commonly known as:  ULTRAM Take 1 tablet (50 mg total) by mouth every 6 (six) hours as needed for up to 7 days (postoperative pain not controlled with tylenol).   warfarin 6 MG tablet Commonly known as:  COUMADIN Take 6 mg by mouth every evening.       Disposition: Discharge disposition: 01-Home or Self Care       Discharge Instructions    Diet - low sodium heart healthy   Complete by:  As directed    Increase activity slowly   Complete by:  As directed       Follow-up Information    Ileana Roup, MD Follow up in 2 week(s).   Specialty:  General Surgery Contact information: [place] [address] [phone]            Signed: Alphonsa Overall, M.D., [facility] Office:  [phone]  08/26/2017, 10:06 AM

## 2017-08-26 NOTE — Progress Notes (Signed)
[place] [facility] Office:  [phone] General Surgery Progress Note   LOS: 9 days  POD -  9 Days Post-Op  Chief Complaint: Colonic polyp  Assessment and Plan: 1.  LAPAROSCOPIC  RIGHT HEMI COLECTOMY, COLONOSCOPY, UMBILICAL HERNIA REPAIR - 08/17/2017 - [facility]. [facility]  For unresectable polyp - tubular adenoma with focal high grade dysplasia  2.  HTN 3.  DM 4.  Mechanical heart valve - [facility] - 2015  Anticoagulated - Coumadin - PT/INR - 22.1/1.95  5.  Hgb - 9.4 - 08/26/2017 6.  DVT prophylaxis - On coumadin   Active Problems:   Colon polyp   Subjective:  Doing well.  Ready to go home.  Objective:   Vitals:   08/25/17 2141 08/26/17 0643  BP: 132/84 106/64  Pulse: 69 72  Resp: 16 18  Temp: 99.2 F (37.3 C) 98.1 F (36.7 C)  SpO2: 96% 100%     Intake/Output from previous day:  08/23 0701 - 08/24 0700 In: 1180 [P.O.:1180] Out: 1125 [Urine:1125]  Intake/Output this shift:  Total I/O In: 325 [P.O.:325] Out: -    Physical Exam:   General: Obese AA M who is alert and oriented.    HEENT: Normal. Pupils equal. .   Lungs: Clear.  IS= 2,000 cc   Abdomen: Large.  Has BS.   Wound: Clean.   Lab Results:    Recent Labs    08/25/17 0411 08/26/17 0522  WBC 6.1 6.3  HGB 9.0* 9.4*  HCT 28.5* 30.4*  PLT 215 215    BMET  No results for input(s): NA, K, CL, CO2, GLUCOSE, BUN, CREATININE, CALCIUM in the last 72 hours.  PT/INR   Recent Labs    08/25/17 0411 08/26/17 0522  LABPROT 22.0* 22.1*  INR 1.94 1.95    ABG  No results for input(s): PHART, HCO3 in the last 72 hours.  Invalid input(s): PCO2, PO2   Studies/Results:  No results found.   Anti-infectives:   Anti-infectives (From admission, onward)   Start     Dose/Rate Route Frequency Ordered Stop   08/17/17 1400  neomycin (MYCIFRADIN) tablet 1,000 mg  Status:  Discontinued     1,000 mg Oral 3 times per day 08/17/17 0752 08/17/17 0802   08/17/17 1400  metroNIDAZOLE (FLAGYL) tablet 1,000 mg  Status:   Discontinued     1,000 mg Oral 3 times per day 08/17/17 0752 08/17/17 0802   08/17/17 0800  cefoTEtan (CEFOTAN) 2 g in sodium chloride 0.9 % 100 mL IVPB     2 g 200 mL/hr over 30 Minutes Intravenous On call to O.R. 08/17/17 0752 08/17/17 0949      Alphonsa Overall, MD, FACS Pager: [place] [facility] Office: [phone] 08/26/2017

## 2018-02-14 ENCOUNTER — Emergency Department: Payer: Medicare Other

## 2018-02-14 ENCOUNTER — Encounter: Payer: Self-pay | Admitting: *Deleted

## 2018-02-14 ENCOUNTER — Emergency Department
Admission: EM | Admit: 2018-02-14 | Discharge: 2018-02-14 | Disposition: A | Discharge status: Home / Self Care | Payer: Medicare Other | Attending: Emergency Medicine | Admitting: Emergency Medicine

## 2018-02-14 DIAGNOSIS — Z7984 Long term (current) use of oral hypoglycemic drugs: Secondary | ICD-10-CM | POA: Insufficient documentation

## 2018-02-14 DIAGNOSIS — I10 Essential (primary) hypertension: Secondary | ICD-10-CM | POA: Diagnosis not present

## 2018-02-14 DIAGNOSIS — M542 Cervicalgia: Secondary | ICD-10-CM | POA: Diagnosis present

## 2018-02-14 DIAGNOSIS — M62838 Other muscle spasm: Secondary | ICD-10-CM | POA: Diagnosis not present

## 2018-02-14 DIAGNOSIS — R51 Headache: Secondary | ICD-10-CM | POA: Insufficient documentation

## 2018-02-14 DIAGNOSIS — E119 Type 2 diabetes mellitus without complications: Secondary | ICD-10-CM | POA: Insufficient documentation

## 2018-02-14 DIAGNOSIS — Z7901 Long term (current) use of anticoagulants: Secondary | ICD-10-CM | POA: Diagnosis not present

## 2018-02-14 DIAGNOSIS — Z7982 Long term (current) use of aspirin: Secondary | ICD-10-CM | POA: Diagnosis not present

## 2018-02-14 DIAGNOSIS — Z79899 Other long term (current) drug therapy: Secondary | ICD-10-CM | POA: Diagnosis not present

## 2018-02-14 HISTORY — DX: Unspecified osteoarthritis, unspecified site: M19.90

## 2018-02-14 MED ORDER — METHOCARBAMOL 500 MG PO TABS: 500.0000 mg | ORAL_TABLET | Freq: Three times a day (TID) | ORAL | 0 refills | Status: AC | PRN

## 2018-02-14 MED ORDER — DEXAMETHASONE SODIUM PHOSPHATE 10 MG/ML IJ SOLN
10.0000 mg | Freq: Once | INTRAMUSCULAR | Status: AC
  Administered 2018-02-14: 10 mg via INTRAMUSCULAR
  Filled 2018-02-14: qty 1

## 2018-02-14 MED ORDER — ORPHENADRINE CITRATE 30 MG/ML IJ SOLN
60.0000 mg | Freq: Two times a day (BID) | INTRAMUSCULAR | Status: DC
  Administered 2018-02-14: 60 mg via INTRAMUSCULAR
  Filled 2018-02-14: qty 2

## 2018-02-14 MED ORDER — PREDNISONE 50 MG PO TABS: ORAL_TABLET | ORAL | 0 refills | Status: DC

## 2018-02-14 NOTE — ED Triage Notes (Signed)
Pt states that his arthritis flared up and it is in his neck and hips.  Pt states that he could barely move due to the pain and stiffness in his neck.  Pt states that he has had this before but this time is the worst.

## 2018-02-14 NOTE — ED Notes (Signed)
See triage note  Presents with pain from top of head  And pain is moving into neck and shoulder  States he is also having pain to left hip which he thinks is arthritis and feels like neck/shoulder pain is the same  Denies any injury

## 2018-02-14 NOTE — ED Provider Notes (Signed)
[facility] Emergency Department Provider Note  ____________________________________________  Time seen: Approximately 9:01 PM  I have reviewed the triage vital signs and the nursing notes.   HISTORY  Chief Complaint Arthritis    HPI Shane Bean is a 63 y.o. male with a history of hypertension, hyperlipidemia and aortic dissection, presents to the emergency department with posterior neck pain that radiates along the occipital scalp.  Patient also reports that "my hips hurt sometimes".  Patient reports that discomfort has kept him from doing typical activities such as cleaning his house.  He denies falls or mechanisms of trauma.  He denies changes in vision or numbness or tingling in the upper or lower extremities.  No recent falls.  Patient reports that he has been steady on his feet.  No recent episodes of heavy lifting.  He is accompanied by his brother.   Past Medical History:  Diagnosis Date  . Arthritis   . Ascending aortic dissection ([place])   . Diabetes mellitus without complication ([place])   . 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 Hyperlipemia   . Hypertension   . Kidney stones     Patient Active Problem List   Diagnosis Date Noted  . Colon polyp 08/17/2017  . Anemia   . Polyp of sigmoid colon   . Benign neoplasm of descending colon   . Colon neoplasm   . Abdominal pain, epigastric   . Gastritis without bleeding   . Nephrolithiasis 11/06/2015  . Cough with sputum 01/14/2015  . Fatigue 01/14/2015  . Obesity 01/14/2015  . GERD (gastroesophageal reflux disease) 04/24/2014  . Long term current use of anticoagulant therapy 04/24/2014  . Type 2 diabetes mellitus with complication ([place]) 94/70/9628  . Ascending aortic dissection ([place]) 10/04/2013  . HTN (hypertension) 05/28/2013  . Pulmonary emboli ([place]) 06/26/2012  . Pulmonary edema 06/23/2012  . Respiratory failure with hypoxia ([place]) 06/23/2012  . Osteoarthritis of  left hip 04/19/2012  . Right leg DVT ([place]) 06/17/2011    Past Surgical History:  Procedure Laterality Date  . CARDIAC SURGERY    . COLONOSCOPY WITH PROPOFOL N/A 04/04/2017   Procedure: COLONOSCOPY WITH PROPOFOL;  Surgeon: Lucilla Lame, MD;  Location: [facility] ENDOSCOPY;  Service: Endoscopy;  Laterality: N/A;  . DENTAL SURGERY    . ESOPHAGOGASTRODUODENOSCOPY (EGD) WITH PROPOFOL N/A 04/04/2017   Procedure: ESOPHAGOGASTRODUODENOSCOPY (EGD) WITH PROPOFOL;  Surgeon: Lucilla Lame, MD;  Location: [facility] ENDOSCOPY;  Service: Endoscopy;  Laterality: N/A;  . LAPAROSCOPIC RIGHT HEMI COLECTOMY Right 08/17/2017   Procedure: LAPAROSCOPIC  RIGHT HEMI COLECTOMY, COLONOSCOPY, UMBILICAL HERNIA REPAIR;  Surgeon: Ileana Roup, MD;  Location: [facility] ORS;  Service: General;  Laterality: Right;  . REPAIR THORACIC AORTA      Prior to Admission medications   Medication Sig Start Date End Date Taking? Authorizing Provider  allopurinol (ZYLOPRIM) 100 MG tablet Take 100 mg by mouth daily at 3 pm.    [provider]  amLODipine (NORVASC) 10 MG tablet Take 10 mg by mouth at bedtime.    [provider]  aspirin EC 325 MG tablet Take 162.5 mg by mouth daily.  10/15/13   [provider]  doxazosin (CARDURA) 8 MG tablet Take 8 mg by mouth at bedtime.     [provider]  glipiZIDE (GLUCOTROL) 5 MG tablet Take 10 mg by mouth 2 (two) times daily.    [provider]  methocarbamol (ROBAXIN) 500 MG tablet Take 1 tablet (500  mg total) by mouth every 8 (eight) hours as needed for up to 5 days. 02/14/18 02/19/18  Lannie Fields, PA-C  metoprolol tartrate (LOPRESSOR) 100 MG tablet Take 100 mg by mouth 2 (two) times daily.    [provider]  predniSONE (DELTASONE) 50 MG tablet Take one 50 mg tablet once daily for the next five days. 02/14/18   Lannie Fields, PA-C  ranitidine (ZANTAC) 150 MG capsule Take 150 mg by mouth daily at 3 pm.  10/15/13   [provider]  simvastatin  (ZOCOR) 10 MG tablet Take 10 mg by mouth every evening.    [provider]  spironolactone (ALDACTONE) 25 MG tablet Take 25 mg by mouth at bedtime.    [provider]  warfarin (COUMADIN) 6 MG tablet Take 6 mg by mouth every evening.    [provider]    Allergies Chlorthalidone; Hydralazine; and Lisinopril  No family history on file.  Social History Social History   Tobacco Use  . Smoking status: Never Smoker  . Smokeless tobacco: Never Used  Substance Use Topics  . Alcohol use: Yes    Alcohol/week: 28.0 standard drinks    Types: 28 Cans of beer per week  . Drug use: No     Review of Systems  Constitutional: No fever/chills Eyes: No visual changes. No discharge ENT: No upper respiratory complaints. Cardiovascular: no chest pain. Respiratory: no cough. No SOB. Gastrointestinal: No abdominal pain.  No nausea, no vomiting.  No diarrhea.  No constipation. Genitourinary: Negative for dysuria. No hematuria Musculoskeletal: Patient has neck pain.  Skin: Negative for rash, abrasions, lacerations, ecchymosis. Neurological: Negative for headaches, focal weakness or numbness.  ____________________________________________   PHYSICAL EXAM:  VITAL SIGNS: ED Triage Vitals [02/14/18 1847]  Enc Vitals Group     BP (!) 143/82     Pulse Rate 69     Resp 16     Temp 98.9 F (37.2 C)     Temp Source Oral     SpO2 96 %     Weight 270 lb (122.5 kg)     Height      Head Circumference      Peak Flow      Pain Score 8     Pain Loc      Pain Edu?      Excl. in [place]?      Constitutional: Alert and oriented. Well appearing and in no acute distress. Eyes: Conjunctivae are normal. PERRL. EOMI. Head: Atraumatic. ENT:      Ears: TMs are pearly.      Nose: No congestion/rhinnorhea.      Mouth/Throat: Mucous membranes are moist.  Neck: No stridor.  Patient has pain with palpation over the upper trapezius and pain with paraspinal muscle palpation along the  cervical spine. Hematological/Lymphatic/Immunilogical: No cervical lymphadenopathy.  Cardiovascular: Normal rate, regular rhythm. Normal S1 and S2.  Good peripheral circulation. Respiratory: Normal respiratory effort without tachypnea or retractions. Lungs CTAB. Good air entry to the bases with no decreased or absent breath sounds. Gastrointestinal: Bowel sounds 4 quadrants. Soft and nontender to palpation. No guarding or rigidity. No palpable masses. No distention. No CVA tenderness. Musculoskeletal: Full range of motion to all extremities. No gross deformities appreciated. Neurologic:  Normal speech and language. No gross focal neurologic deficits are appreciated.  Skin:  Skin is warm, dry and intact. No rash noted. Psychiatric: Mood and affect are normal. Speech and behavior are normal. Patient exhibits appropriate insight and judgement.  ____________________________________________   LABS (all labs ordered are listed, but only abnormal results are displayed)  Labs Reviewed - No data to display ____________________________________________  EKG   ____________________________________________  RADIOLOGY I personally viewed and evaluated these images as part of my medical decision making, as well as reviewing the written report by the radiologist.  Ct Head Wo Contrast  Result Date: 02/14/2018 CLINICAL DATA:  Pain and stiffness and neck from arthritis. Head trauma. Intracranial venous injury suspected. EXAM: CT HEAD WITHOUT CONTRAST CT CERVICAL SPINE WITHOUT CONTRAST TECHNIQUE: Multidetector CT imaging of the head and cervical spine was performed following the standard protocol without intravenous contrast. Multiplanar CT image reconstructions of the cervical spine were also generated. COMPARISON:  Neck CT 06/05/2014 FINDINGS: CT HEAD FINDINGS Brain: Age related involutional changes of the brain with likely chronic minimal small vessel ischemic disease of periventricular and subcortical  white matter. No acute intracranial hemorrhage, large vascular territory infarction, intra-axial mass nor extra-axial fluid collections. No hydrocephalus. Midline fourth ventricle and basal cisterns. Brainstem and cerebellum are nonacute. Vascular: Atherosclerosis of the carotid siphons bilaterally. No hyperdense vessel sign. Skull: Intact Sinuses/Orbits: Nonacute Other: None CT CERVICAL SPINE FINDINGS Alignment: Straightening of cervical lordosis. Skull base and vertebrae: Intact craniocervical relationship and the Lantus dental interval. Skull base is intact. No acute cervical spine fracture or suspicious osseous lesions. Soft tissues and spinal canal: No prevertebral fluid or swelling. No visible canal hematoma. Disc levels: C5-6 and C7-T1 degenerative disc disease. Uncovertebral joint osteoarthritis with mild uncinate spurring is noted on the right at C2-3, bilaterally at C3-4 and C5-6. Associated right-sided C2-3, left C3-4 and right C4-5 facet arthrosis. Upper chest: Negative Other: None IMPRESSION: 1. Age related involutional changes of the brain without acute intracranial abnormality. 2. Cervical spondylosis without acute cervical spine fracture or posttraumatic listhesis. Electronically Signed   By: Ashley Royalty M.D.   On: 02/14/2018 20:04   Ct Cervical Spine Wo Contrast  Result Date: 02/14/2018 CLINICAL DATA:  Pain and stiffness and neck from arthritis. Head trauma. Intracranial venous injury suspected. EXAM: CT HEAD WITHOUT CONTRAST CT CERVICAL SPINE WITHOUT CONTRAST TECHNIQUE: Multidetector CT imaging of the head and cervical spine was performed following the standard protocol without intravenous contrast. Multiplanar CT image reconstructions of the cervical spine were also generated. COMPARISON:  Neck CT 06/05/2014 FINDINGS: CT HEAD FINDINGS Brain: Age related involutional changes of the brain with likely chronic minimal small vessel ischemic disease of periventricular and subcortical white matter.  No acute intracranial hemorrhage, large vascular territory infarction, intra-axial mass nor extra-axial fluid collections. No hydrocephalus. Midline fourth ventricle and basal cisterns. Brainstem and cerebellum are nonacute. Vascular: Atherosclerosis of the carotid siphons bilaterally. No hyperdense vessel sign. Skull: Intact Sinuses/Orbits: Nonacute Other: None CT CERVICAL SPINE FINDINGS Alignment: Straightening of cervical lordosis. Skull base and vertebrae: Intact craniocervical relationship and the Lantus dental interval. Skull base is intact. No acute cervical spine fracture or suspicious osseous lesions. Soft tissues and spinal canal: No prevertebral fluid or swelling. No visible canal hematoma. Disc levels: C5-6 and C7-T1 degenerative disc disease. Uncovertebral joint osteoarthritis with mild uncinate spurring is noted on the right at C2-3, bilaterally at C3-4 and C5-6. Associated right-sided C2-3, left C3-4 and right C4-5 facet arthrosis. Upper chest: Negative Other: None IMPRESSION: 1. Age related involutional changes of the brain without acute intracranial abnormality. 2. Cervical spondylosis without acute cervical spine fracture or posttraumatic listhesis. Electronically Signed   By: Ashley Royalty M.D.   On: 02/14/2018 20:04    ____________________________________________  PROCEDURES  Procedure(s) performed:    Procedures    Medications  orphenadrine (NORFLEX) injection 60 mg (60 mg Intramuscular Given 02/14/18 1936)  dexamethasone (DECADRON) injection 10 mg (10 mg Intramuscular Given 02/14/18 2015)     ____________________________________________   INITIAL IMPRESSION / ASSESSMENT AND PLAN / ED COURSE  Pertinent labs & imaging results that were available during my care of the patient were reviewed by me and considered in my medical decision making (see chart for details).  Review of the [place] CSRS was performed in accordance of the [place] prior to dispensing any controlled drugs.       Assessment and plan Muscle spasm Patient presents to the emergency department with neck pain that radiates along the occipital scalp that has kept patient from typical activities of daily living.  CT head and CT cervical spine revealed no acute abnormality.  Patient's pain improved significantly in the emergency department with Decadron and Norflex.  Patient was discharged with prednisone and Robaxin.  He was advised to follow-up with primary care if symptoms persist.  All patient questions were answered.     ____________________________________________  FINAL CLINICAL IMPRESSION(S) / ED DIAGNOSES  Final diagnoses:  Muscle spasm      NEW MEDICATIONS STARTED DURING THIS VISIT:  ED Discharge Orders         Ordered    predniSONE (DELTASONE) 50 MG tablet     02/14/18 2056    methocarbamol (ROBAXIN) 500 MG tablet  Every 8 hours PRN     02/14/18 2056              This chart was dictated using voice recognition software/Dragon. Despite best efforts to proofread, errors can occur which can change the meaning. Any change was purely unintentional.    Karren Cobble 02/14/18 2108    Orbie Pyo, MD 02/14/18 [phone]

## 2018-02-22 ENCOUNTER — Other Ambulatory Visit: Payer: Self-pay

## 2018-02-22 ENCOUNTER — Encounter: Payer: Self-pay | Admitting: *Deleted

## 2018-02-22 ENCOUNTER — Emergency Department: Payer: Medicare Other

## 2018-02-22 ENCOUNTER — Emergency Department
Admission: EM | Admit: 2018-02-22 | Discharge: 2018-02-23 | Disposition: A | Discharge status: Home / Self Care | Payer: Medicare Other | Source: Home / Self Care | Attending: Student in an Organized Health Care Education/Training Program | Admitting: Student in an Organized Health Care Education/Training Program

## 2018-02-22 DIAGNOSIS — Z79899 Other long term (current) drug therapy: Secondary | ICD-10-CM | POA: Insufficient documentation

## 2018-02-22 DIAGNOSIS — Z7984 Long term (current) use of oral hypoglycemic drugs: Secondary | ICD-10-CM | POA: Insufficient documentation

## 2018-02-22 DIAGNOSIS — N509 Disorder of male genital organs, unspecified: Secondary | ICD-10-CM

## 2018-02-22 DIAGNOSIS — Z7901 Long term (current) use of anticoagulants: Secondary | ICD-10-CM | POA: Insufficient documentation

## 2018-02-22 DIAGNOSIS — N50819 Testicular pain, unspecified: Secondary | ICD-10-CM

## 2018-02-22 DIAGNOSIS — N433 Hydrocele, unspecified: Secondary | ICD-10-CM

## 2018-02-22 DIAGNOSIS — E119 Type 2 diabetes mellitus without complications: Secondary | ICD-10-CM | POA: Insufficient documentation

## 2018-02-22 DIAGNOSIS — R1030 Lower abdominal pain, unspecified: Secondary | ICD-10-CM | POA: Diagnosis not present

## 2018-02-22 LAB — URINALYSIS, COMPLETE (UACMP) WITH MICROSCOPIC
BACTERIA UA: NONE SEEN
Bilirubin Urine: NEGATIVE
Glucose, UA: >=500 mg/dL — AB
Ketones, ur: 5 mg/dL — AB
Nitrite: NEGATIVE
PROTEIN: 100 mg/dL — AB
RBC / HPF: >50 RBC/hpf — ABNORMAL HIGH (ref 0–5)
Specific Gravity, Urine: 1.024 (ref 1.005–1.030)
pH: 5 (ref 5.0–8.0)

## 2018-02-22 MED ORDER — OXYCODONE-ACETAMINOPHEN 5-325 MG PO TABS
1.0000 | ORAL_TABLET | Freq: Once | ORAL | Status: AC
  Administered 2018-02-22: 1 via ORAL

## 2018-02-22 MED ORDER — LEVOFLOXACIN 500 MG PO TABS
500.0000 mg | ORAL_TABLET | Freq: Once | ORAL | Status: AC
  Administered 2018-02-23: 500 mg via ORAL
  Filled 2018-02-22: qty 1

## 2018-02-22 MED ORDER — HYDROCODONE-ACETAMINOPHEN 5-325 MG PO TABS: 1.0000 | ORAL_TABLET | ORAL | 0 refills | Status: DC | PRN

## 2018-02-22 MED ORDER — LEVOFLOXACIN 500 MG PO TABS: 500.0000 mg | ORAL_TABLET | Freq: Every day | ORAL | 0 refills | Status: DC

## 2018-02-22 MED ORDER — OXYCODONE-ACETAMINOPHEN 5-325 MG PO TABS
ORAL_TABLET | ORAL | Status: AC
  Administered 2018-02-22: 1 via ORAL
  Filled 2018-02-22: qty 1

## 2018-02-22 NOTE — Discharge Instructions (Signed)
As we discussed, you need to pay very close attention to your blood sugars while taking this antibiotic as it can increase the effect of your glipizide medication.  You also need to have follow-up with your PCP to check your INR as this medication also can interact with Coumadin.  Unfortunately, according to IDSA guidelines and recommendations for treatment of epididymitis there are not any other alternatives

## 2018-02-22 NOTE — ED Provider Notes (Signed)
[facility] Emergency Department Provider Note    First MD Initiated Contact with Patient 02/22/18 2157     (approximate)  I have reviewed the triage vital signs and the nursing notes.   HISTORY  Chief Complaint Groin Pain    HPI Shane Bean is a 63 y.o. male for evaluation of left groin and testicle pain for the past several days.  He states he has a history of kidney stones as well as many hernias.  Denies any fevers or chills.  Denies any dysuria but does endorse increased urinary frequency.  States the pain is mild to moderate and has essentially been constant since Tuesday.  Denies any heavy straining at that point.  Denies any new rashes.  Pain is worse with palpation but bearable when he is not moving.    Past Medical History:  Diagnosis Date  . Arthritis   . Ascending aortic dissection ([place])   . Diabetes mellitus without complication ([place])   . 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 Hyperlipemia   . Hypertension   . Kidney stones    History reviewed. No pertinent family history. Past Surgical History:  Procedure Laterality Date  . CARDIAC SURGERY    . COLONOSCOPY WITH PROPOFOL N/A 04/04/2017   Procedure: COLONOSCOPY WITH PROPOFOL;  Surgeon: Lucilla Lame, MD;  Location: [facility] ENDOSCOPY;  Service: Endoscopy;  Laterality: N/A;  . DENTAL SURGERY    . ESOPHAGOGASTRODUODENOSCOPY (EGD) WITH PROPOFOL N/A 04/04/2017   Procedure: ESOPHAGOGASTRODUODENOSCOPY (EGD) WITH PROPOFOL;  Surgeon: Lucilla Lame, MD;  Location: [facility] ENDOSCOPY;  Service: Endoscopy;  Laterality: N/A;  . LAPAROSCOPIC RIGHT HEMI COLECTOMY Right 08/17/2017   Procedure: LAPAROSCOPIC  RIGHT HEMI COLECTOMY, COLONOSCOPY, UMBILICAL HERNIA REPAIR;  Surgeon: Ileana Roup, MD;  Location: [facility] ORS;  Service: General;  Laterality: Right;  . REPAIR THORACIC AORTA     Patient Active Problem List   Diagnosis Date Noted  . Colon polyp 08/17/2017  . Anemia   . Polyp  of sigmoid colon   . Benign neoplasm of descending colon   . Colon neoplasm   . Abdominal pain, epigastric   . Gastritis without bleeding   . Nephrolithiasis 11/06/2015  . Cough with sputum 01/14/2015  . Fatigue 01/14/2015  . Obesity 01/14/2015  . GERD (gastroesophageal reflux disease) 04/24/2014  . Long term current use of anticoagulant therapy 04/24/2014  . Type 2 diabetes mellitus with complication ([place]) 41/93/7902  . Ascending aortic dissection ([place]) 10/04/2013  . HTN (hypertension) 05/28/2013  . Pulmonary emboli ([place]) 06/26/2012  . Pulmonary edema 06/23/2012  . Respiratory failure with hypoxia ([place]) 06/23/2012  . Osteoarthritis of left hip 04/19/2012  . Right leg DVT ([place]) 06/17/2011      Prior to Admission medications   Medication Sig Start Date End Date Taking? Authorizing Provider  allopurinol (ZYLOPRIM) 100 MG tablet Take 100 mg by mouth daily at 3 pm.    [provider]  amLODipine (NORVASC) 10 MG tablet Take 10 mg by mouth at bedtime.    [provider]  aspirin EC 325 MG tablet Take 162.5 mg by mouth daily.  10/15/13   [provider]  doxazosin (CARDURA) 8 MG tablet Take 8 mg by mouth at bedtime.     [provider]  glipiZIDE (GLUCOTROL) 5 MG tablet Take 10 mg by mouth 2 (two) times daily.    [provider]  HYDROcodone-acetaminophen (NORCO) 5-325 MG tablet Take 1 tablet by mouth  every 4 (four) hours as needed for moderate pain. 02/22/18   Merlyn Lot, MD  levofloxacin (LEVAQUIN) 500 MG tablet Take 1 tablet (500 mg total) by mouth daily for 10 days. 02/22/18 03/04/18  Merlyn Lot, MD  metoprolol tartrate (LOPRESSOR) 100 MG tablet Take 100 mg by mouth 2 (two) times daily.    [provider]  predniSONE (DELTASONE) 50 MG tablet Take one 50 mg tablet once daily for the next five days. 02/14/18   Lannie Fields, PA-C  ranitidine (ZANTAC) 150 MG capsule Take 150 mg by mouth daily at 3 pm.  10/15/13   [provider]  simvastatin (ZOCOR) 10 MG tablet Take 10 mg by mouth every evening.    [provider]  spironolactone (ALDACTONE) 25 MG tablet Take 25 mg by mouth at bedtime.    [provider]  warfarin (COUMADIN) 6 MG tablet Take 6 mg by mouth every evening.    [provider]    Allergies Chlorthalidone; Hydralazine; and Lisinopril    Social History Social History   Tobacco Use  . Smoking status: Never Smoker  . Smokeless tobacco: Never Used  Substance Use Topics  . Alcohol use: Yes    Alcohol/week: 28.0 standard drinks    Types: 28 Cans of beer per week  . Drug use: No    Review of Systems Patient denies headaches, rhinorrhea, blurry vision, numbness, shortness of breath, chest pain, edema, cough, abdominal pain, nausea, vomiting, diarrhea, dysuria, fevers, rashes or hallucinations unless otherwise stated above in HPI. ____________________________________________   PHYSICAL EXAM:  VITAL SIGNS: Vitals:   02/22/18 1657  BP: 115/68  Pulse: 84  Resp: 18  Temp: 98.6 F (37 C)  SpO2: 98%    Constitutional: Alert and oriented.  Eyes: Conjunctivae are normal.  Head: Atraumatic. Nose: No congestion/rhinnorhea. Mouth/Throat: Mucous membranes are moist.   Neck: No stridor. Painless ROM.  Cardiovascular: Normal rate, regular rhythm. Grossly normal heart sounds.  Good peripheral circulation. Respiratory: Normal respiratory effort.  No retractions. Lungs CTAB. Gastrointestinal: Soft and nontender. No distention. No abdominal bruits. No CVA tenderness. Genitourinary: Tenderness over the posterior aspect of the left testicle.  Cremasteric reflexes present and equal bilaterally.  No crepitus erythema or blistering.  No evidence of cellulitic changes particularly in the perineum. Musculoskeletal: No lower extremity tenderness nor edema.  No joint effusions. Neurologic:  Normal speech and language. No gross focal neurologic deficits are appreciated.  No facial droop Skin:  Skin is warm, dry and intact. No rash noted. Psychiatric: Mood and affect are normal. Speech and behavior are normal.  ____________________________________________   LABS (all labs ordered are listed, but only abnormal results are displayed)  Results for orders placed or performed during the hospital encounter of 02/22/18 (from the past 24 hour(s))  Urinalysis, Complete w Microscopic     Status: Abnormal   Collection Time: 02/22/18  5:19 PM  Result Value Ref Range   Color, Urine AMBER (A) YELLOW   APPearance HAZY (A) CLEAR   Specific Gravity, Urine 1.024 1.005 - 1.030   pH 5.0 5.0 - 8.0   Glucose, UA >=500 (A) NEGATIVE mg/dL   Hgb urine dipstick MODERATE (A) NEGATIVE   Bilirubin Urine NEGATIVE NEGATIVE   Ketones, ur 5 (A) NEGATIVE mg/dL   Protein, ur 100 (A) NEGATIVE mg/dL   Nitrite NEGATIVE NEGATIVE   Leukocytes,Ua TRACE (A) NEGATIVE   RBC / HPF >50 (H) 0 - 5 RBC/hpf   WBC, UA 21-50 0 - 5 WBC/hpf  Bacteria, UA NONE SEEN NONE SEEN   Squamous Epithelial / LPF 0-5 0 - 5   Mucus PRESENT    ____________________________________________ ____________________________________________  RADIOLOGY  I personally reviewed all radiographic images ordered to evaluate for the above acute complaints and reviewed radiology reports and findings.  These findings were personally discussed with the patient.  Please see medical record for radiology report.  ____________________________________________   PROCEDURES  Procedure(s) performed:  Procedures    Critical Care performed: no ____________________________________________   INITIAL IMPRESSION / ASSESSMENT AND PLAN / ED COURSE  Pertinent labs & imaging results that were available during my care of the patient were reviewed by me and considered in my medical decision making (see chart for details).   DDX: Epididymitis, stone, torsion, hernia, orchitis, abscess, malignancy  Shane Bean is a 63 y.o. who  presents to the ED with symptoms as described above.  Patient's exam without any abdominal tenderness.  No perennial abnormality or anything to suggest Fournier's gangrene.  Does have tenderness palpation particular the left posterior scrotum and testicle.  Ultrasound of scrotum shows bilateral complex hydroceles without any evidence of torsion.  Given his history of stone and as does have significant history of hernias will also order CT imaging to exclude complex hernia that could be causing the patient's pain.  Clinical Course as of Feb 23 2307  Thu Feb 22, 2018  2256 CT imaging results discussed with patient.  On exam there is no signs of [place] green green.  Seems to be focally tender in the posterior aspect of the left testicle concerning clinically for epididymitis.  This also may be where the complex hydrocele is however.  Given his symptoms I will start on antibiotics for epididymitis.  We will give pain medication and referral to urologist.  The remainder of exam is reassuring.  He is afebrile and vital signs are otherwise stable.  Discussed signs and symptoms for which she should return to the ER.   [PR]    Clinical Course User Index [PR] Merlyn Lot, MD     As part of my medical decision making, I reviewed the following data within the [place] notes reviewed and incorporated, Labs reviewed, notes from prior ED visits and [place] Controlled Substance Database   ____________________________________________   FINAL CLINICAL IMPRESSION(S) / ED DIAGNOSES  Final diagnoses:  Bilateral hydrocele  Testicle tenderness      NEW MEDICATIONS STARTED DURING THIS VISIT:  New Prescriptions   HYDROCODONE-ACETAMINOPHEN (NORCO) 5-325 MG TABLET    Take 1 tablet by mouth every 4 (four) hours as needed for moderate pain.   LEVOFLOXACIN (LEVAQUIN) 500 MG TABLET    Take 1 tablet (500 mg total) by mouth daily for 10 days.     Note:  This document was prepared using  Dragon voice recognition software and may include unintentional dictation errors.    Merlyn Lot, MD 02/22/18 [phone]

## 2018-02-22 NOTE — ED Notes (Signed)
Spoke with triage nurse who st verbal order obtained for u/s, no labs at this time

## 2018-02-22 NOTE — ED Triage Notes (Signed)
Pt to ED reporting pain in his groin since Tuesday. Pain is worse in the left testicle and left groin. Pt reports swelling in the left testicle. No trauma noted. Pt has been having difficulty with ambulation due to the pain. Pt was placed on prednisone last week for arthritis but since starting medication groin has worsened. No changes in urination.

## 2018-02-24 ENCOUNTER — Emergency Department
Admission: EM | Admit: 2018-02-24 | Discharge: 2018-02-24 | Disposition: A | Discharge status: Home / Self Care | Payer: Medicare Other | Attending: Emergency Medicine | Admitting: Emergency Medicine

## 2018-02-24 ENCOUNTER — Encounter: Payer: Self-pay | Admitting: Emergency Medicine

## 2018-02-24 ENCOUNTER — Emergency Department: Payer: Medicare Other

## 2018-02-24 ENCOUNTER — Other Ambulatory Visit: Payer: Self-pay

## 2018-02-24 DIAGNOSIS — N433 Hydrocele, unspecified: Secondary | ICD-10-CM

## 2018-02-24 DIAGNOSIS — E119 Type 2 diabetes mellitus without complications: Secondary | ICD-10-CM | POA: Insufficient documentation

## 2018-02-24 DIAGNOSIS — Z7984 Long term (current) use of oral hypoglycemic drugs: Secondary | ICD-10-CM | POA: Insufficient documentation

## 2018-02-24 DIAGNOSIS — Z7901 Long term (current) use of anticoagulants: Secondary | ICD-10-CM | POA: Insufficient documentation

## 2018-02-24 DIAGNOSIS — Z79899 Other long term (current) drug therapy: Secondary | ICD-10-CM | POA: Insufficient documentation

## 2018-02-24 DIAGNOSIS — D124 Benign neoplasm of descending colon: Secondary | ICD-10-CM | POA: Insufficient documentation

## 2018-02-24 DIAGNOSIS — L03818 Cellulitis of other sites: Secondary | ICD-10-CM

## 2018-02-24 DIAGNOSIS — I10 Essential (primary) hypertension: Secondary | ICD-10-CM | POA: Insufficient documentation

## 2018-02-24 LAB — CBC WITH DIFFERENTIAL/PLATELET
ABS IMMATURE GRANULOCYTES: 0.11 10*3/uL — AB (ref 0.00–0.07)
BASOS PCT: 0 %
Basophils Absolute: 0 10*3/uL (ref 0.0–0.1)
Eosinophils Absolute: 0.1 10*3/uL (ref 0.0–0.5)
Eosinophils Relative: 1 %
HCT: 32 % — ABNORMAL LOW (ref 39.0–52.0)
Hemoglobin: 9.5 g/dL — ABNORMAL LOW (ref 13.0–17.0)
Immature Granulocytes: 1 %
Lymphocytes Relative: 11 %
Lymphs Abs: 1.3 10*3/uL (ref 0.7–4.0)
MCH: 20.9 pg — AB (ref 26.0–34.0)
MCHC: 29.7 g/dL — ABNORMAL LOW (ref 30.0–36.0)
MCV: 70.5 fL — AB (ref 80.0–100.0)
MONO ABS: 0.9 10*3/uL (ref 0.1–1.0)
MONOS PCT: 8 %
Neutro Abs: 8.8 10*3/uL — ABNORMAL HIGH (ref 1.7–7.7)
Neutrophils Relative %: 79 %
PLATELETS: 227 10*3/uL (ref 150–400)
RBC: 4.54 MIL/uL (ref 4.22–5.81)
RDW: 19.6 % — ABNORMAL HIGH (ref 11.5–15.5)
WBC: 11.1 10*3/uL — ABNORMAL HIGH (ref 4.0–10.5)
nRBC: 0 % (ref 0.0–0.2)

## 2018-02-24 LAB — COMPREHENSIVE METABOLIC PANEL
ALT: 13 U/L (ref 0–44)
AST: 16 U/L (ref 15–41)
Albumin: 3.7 g/dL (ref 3.5–5.0)
Alkaline Phosphatase: 38 U/L (ref 38–126)
Anion gap: 8 (ref 5–15)
BUN: 15 mg/dL (ref 8–23)
CO2: 25 mmol/L (ref 22–32)
Calcium: 9 mg/dL (ref 8.9–10.3)
Chloride: 100 mmol/L (ref 98–111)
Creatinine, Ser: 1.09 mg/dL (ref 0.61–1.24)
GFR calc Af Amer: >60 mL/min (ref >60)
GFR calc non Af Amer: >60 mL/min (ref >60)
GLUCOSE: 229 mg/dL — AB (ref 70–99)
Potassium: 3.9 mmol/L (ref 3.5–5.1)
Sodium: 133 mmol/L — ABNORMAL LOW (ref 135–145)
TOTAL PROTEIN: 7.4 g/dL (ref 6.5–8.1)
Total Bilirubin: 0.9 mg/dL (ref 0.3–1.2)

## 2018-02-24 LAB — PROTIME-INR
INR: 2.24
Prothrombin Time: 24.5 seconds — ABNORMAL HIGH (ref 11.4–15.2)

## 2018-02-24 MED ORDER — IOHEXOL 300 MG/ML  SOLN
100.0000 mL | Freq: Once | INTRAMUSCULAR | Status: AC | PRN
  Administered 2018-02-24: 100 mL via INTRAVENOUS

## 2018-02-24 MED ORDER — OXYCODONE-ACETAMINOPHEN 5-325 MG PO TABS: 1.0000 | ORAL_TABLET | Freq: Four times a day (QID) | ORAL | 0 refills | Status: DC | PRN

## 2018-02-24 MED ORDER — OXYCODONE-ACETAMINOPHEN 5-325 MG PO TABS
1.0000 | ORAL_TABLET | Freq: Once | ORAL | Status: AC
  Administered 2018-02-24: 1 via ORAL
  Filled 2018-02-24: qty 1

## 2018-02-24 MED ORDER — SULFAMETHOXAZOLE-TRIMETHOPRIM 800-160 MG PO TABS: 1.0000 | ORAL_TABLET | Freq: Two times a day (BID) | ORAL | 0 refills | Status: DC

## 2018-02-24 MED ORDER — SULFAMETHOXAZOLE-TRIMETHOPRIM 800-160 MG PO TABS
1.0000 | ORAL_TABLET | Freq: Once | ORAL | Status: AC
  Administered 2018-02-24: 1 via ORAL
  Filled 2018-02-24: qty 1

## 2018-02-24 NOTE — ED Notes (Signed)
Reports swelling to left testicle. Awaiting md to assess,. Labs sent from triage.

## 2018-02-24 NOTE — ED Notes (Signed)
Off unit to CT, Lab drawn and sent.

## 2018-02-24 NOTE — ED Provider Notes (Signed)
[facility] Emergency Department Provider Note  ____________________________________________  Time seen: Approximately 8:37 PM  I have reviewed the triage vital signs and the nursing notes.   HISTORY  Chief Complaint Groin Swelling    HPI Shane Bean is a 63 y.o. male with a history of diabetes DVT hypertension who complains of left scrotal swelling for the past 4 days, gradual onset, worsening.  Was here 2 days ago and discharged on Levaquin for hydrocele.  Denies fevers chills or vomiting.  Pain is moderate intensity, nonradiating, no aggravating or alleviating factors.  Denies penile discharge or hematuria.      Past Medical History:  Diagnosis Date  . Arthritis   . Ascending aortic dissection ([place])   . Diabetes mellitus without complication ([place])   . 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 Hyperlipemia   . Hypertension   . Kidney stones      Patient Active Problem List   Diagnosis Date Noted  . Colon polyp 08/17/2017  . Anemia   . Polyp of sigmoid colon   . Benign neoplasm of descending colon   . Colon neoplasm   . Abdominal pain, epigastric   . Gastritis without bleeding   . Nephrolithiasis 11/06/2015  . Cough with sputum 01/14/2015  . Fatigue 01/14/2015  . Obesity 01/14/2015  . GERD (gastroesophageal reflux disease) 04/24/2014  . Long term current use of anticoagulant therapy 04/24/2014  . Type 2 diabetes mellitus with complication ([place]) 62/83/1517  . Ascending aortic dissection ([place]) 10/04/2013  . HTN (hypertension) 05/28/2013  . Pulmonary emboli ([place]) 06/26/2012  . Pulmonary edema 06/23/2012  . Respiratory failure with hypoxia ([place]) 06/23/2012  . Osteoarthritis of left hip 04/19/2012  . Right leg DVT ([place]) 06/17/2011     Past Surgical History:  Procedure Laterality Date  . CARDIAC SURGERY    . COLONOSCOPY WITH PROPOFOL N/A 04/04/2017   Procedure: COLONOSCOPY WITH PROPOFOL;  Surgeon: Lucilla Lame,  MD;  Location: [facility] ENDOSCOPY;  Service: Endoscopy;  Laterality: N/A;  . DENTAL SURGERY    . ESOPHAGOGASTRODUODENOSCOPY (EGD) WITH PROPOFOL N/A 04/04/2017   Procedure: ESOPHAGOGASTRODUODENOSCOPY (EGD) WITH PROPOFOL;  Surgeon: Lucilla Lame, MD;  Location: [facility] ENDOSCOPY;  Service: Endoscopy;  Laterality: N/A;  . LAPAROSCOPIC RIGHT HEMI COLECTOMY Right 08/17/2017   Procedure: LAPAROSCOPIC  RIGHT HEMI COLECTOMY, COLONOSCOPY, UMBILICAL HERNIA REPAIR;  Surgeon: Ileana Roup, MD;  Location: [facility] ORS;  Service: General;  Laterality: Right;  . REPAIR THORACIC AORTA       Prior to Admission medications   Medication Sig Start Date End Date Taking? Authorizing Provider  allopurinol (ZYLOPRIM) 100 MG tablet Take 100 mg by mouth daily at 3 pm.    [provider]  amLODipine (NORVASC) 10 MG tablet Take 10 mg by mouth at bedtime.    [provider]  aspirin EC 325 MG tablet Take 162.5 mg by mouth daily.  10/15/13   [provider]  doxazosin (CARDURA) 8 MG tablet Take 8 mg by mouth at bedtime.     [provider]  glipiZIDE (GLUCOTROL) 5 MG tablet Take 10 mg by mouth 2 (two) times daily.    [provider]  HYDROcodone-acetaminophen (NORCO) 5-325 MG tablet Take 1 tablet by mouth every 4 (four) hours as needed for moderate pain. 02/22/18   Merlyn Lot, MD  levofloxacin (LEVAQUIN) 500 MG tablet Take 1 tablet (500 mg total) by mouth daily for 10 days. 02/22/18 03/04/18  Merlyn Lot, MD  metoprolol tartrate (LOPRESSOR) 100 MG tablet Take 100 mg by mouth 2 (two) times daily.    [provider]  oxyCODONE-acetaminophen (PERCOCET) 5-325 MG tablet Take 1 tablet by mouth every 6 (six) hours as needed for severe pain. 02/24/18 02/24/19  Carrie Mew, MD  predniSONE (DELTASONE) 50 MG tablet Take one 50 mg tablet once daily for the next five days. 02/14/18   Lannie Fields, PA-C  ranitidine (ZANTAC) 150 MG capsule Take 150 mg by mouth daily at 3 pm.   10/15/13   [provider]  simvastatin (ZOCOR) 10 MG tablet Take 10 mg by mouth every evening.    [provider]  spironolactone (ALDACTONE) 25 MG tablet Take 25 mg by mouth at bedtime.    [provider]  sulfamethoxazole-trimethoprim (BACTRIM DS) 800-160 MG tablet Take 1 tablet by mouth 2 (two) times daily. 02/24/18   Carrie Mew, MD  warfarin (COUMADIN) 6 MG tablet Take 6 mg by mouth every evening.    [provider]     Allergies Chlorthalidone; Hydralazine; and Lisinopril   No family history on file.  Social History Social History   Tobacco Use  . Smoking status: Never Smoker  . Smokeless tobacco: Never Used  Substance Use Topics  . Alcohol use: Yes    Alcohol/week: 28.0 standard drinks    Types: 28 Cans of beer per week  . Drug use: No    Review of Systems  Constitutional:   No fever or chills.  ENT:   No sore throat. No rhinorrhea. Cardiovascular:   No chest pain or syncope. Respiratory:   No dyspnea or cough. Gastrointestinal:   Negative for abdominal pain, vomiting and diarrhea.  Musculoskeletal:   Negative for focal pain or swelling All other systems reviewed and are negative except as documented above in ROS and HPI.  ____________________________________________   PHYSICAL EXAM:  VITAL SIGNS: ED Triage Vitals  Enc Vitals Group     BP 02/24/18 1201 119/72     Pulse Rate 02/24/18 1201 72     Resp 02/24/18 1201 18     Temp 02/24/18 1201 98.5 F (36.9 C)     Temp Source 02/24/18 1201 Oral     SpO2 02/24/18 1201 97 %     Weight 02/24/18 1202 280 lb (127 kg)     Height 02/24/18 1202 5\' 10"  (1.778 m)     Head Circumference --      Peak Flow --      Pain Score 02/24/18 1202 10     Pain Loc --      Pain Edu? --      Excl. in [place]? --     Vital signs reviewed, nursing assessments reviewed.   Constitutional:   Alert and oriented. Non-toxic appearance. Eyes:   Conjunctivae are normal. EOMI. PERRL. ENT       Head:   Normocephalic and atraumatic.      Nose:   No congestion/rhinnorhea.       Mouth/Throat:   MMM, no pharyngeal erythema. No peritonsillar mass.       Neck:   No meningismus. Full ROM. Hematological/Lymphatic/Immunilogical:   No cervical or inguinal lymphadenopathy. Cardiovascular:   RRR. Symmetric bilateral radial and DP pulses.  No murmurs. Cap refill less than 2 seconds. Respiratory:   Normal respiratory effort without tachypnea/retractions. Breath sounds are clear and equal bilaterally. No wheezes/rales/rhonchi. Gastrointestinal:   Soft and nontender. Non distended. There is no CVA tenderness.  No rebound, rigidity, or guarding. Genitourinary:  Scrotal swelling, scrotal induration on the left.  Tenderness on the left.  Mild erythema as well.  No crepitus.  Perineum normal.  No inguinal lymphadenopathy.  No hernias. Musculoskeletal:   Normal range of motion in all extremities. No joint effusions.  No lower extremity tenderness.  No edema. Neurologic:   Normal speech and language.  Motor grossly intact. No acute focal neurologic deficits are appreciated.  Skin:    Skin is warm, dry and intact. No rash noted.  No petechiae, purpura, or bullae.  ____________________________________________    LABS (pertinent positives/negatives) (all labs ordered are listed, but only abnormal results are displayed) Labs Reviewed  CBC WITH DIFFERENTIAL/PLATELET - Abnormal; Notable for the following components:      Result Value   WBC 11.1 (*)    Hemoglobin 9.5 (*)    HCT 32.0 (*)    MCV 70.5 (*)    MCH 20.9 (*)    MCHC 29.7 (*)    RDW 19.6 (*)    Neutro Abs 8.8 (*)    Abs Immature Granulocytes 0.11 (*)    All other components within normal limits  COMPREHENSIVE METABOLIC PANEL - Abnormal; Notable for the following components:   Sodium 133 (*)    Glucose, Bld 229 (*)    All other components within normal limits  PROTIME-INR - Abnormal; Notable for the following components:   Prothrombin  Time 24.5 (*)    All other components within normal limits   ____________________________________________   EKG    ____________________________________________    RADIOLOGY  Ct Pelvis W Contrast  Result Date: 02/24/2018 CLINICAL DATA:  Left testicle swelling. EXAM: CT PELVIS WITH CONTRAST TECHNIQUE: Multidetector CT imaging of the pelvis was performed using the standard protocol following the bolus administration of intravenous contrast. CONTRAST:  175mL OMNIPAQUE IOHEXOL 300 MG/ML  SOLN COMPARISON:  02/22/2018 FINDINGS: Urinary Tract: Visualized distal ureters are decompressed as is the urinary bladder, grossly unremarkable. Bowel: Diverticulosis in the visualized sigmoid colon and cecum. Visualized small bowel decompressed. Appendix is normal. Vascular/Lymphatic: Atherosclerotic change.  No aneurysm. Reproductive: Mild prominence of the prostate. Bilateral hydroceles noted, left greater than right. This is similar to prior study. Other:  No free fluid or free air. Musculoskeletal: No acute bony abnormality. IMPRESSION: Bilateral hydroceles, left larger than right. This is similar to recent study. Diverticulosis in the visualized sigmoid colon and cecum. Electronically Signed   By: Rolm Baptise M.D.   On: 02/24/2018 18:10    ____________________________________________   PROCEDURES Procedures  ____________________________________________  DIFFERENTIAL DIAGNOSIS   Inguinal hernia, scrotal abscess, Fournier's gangrene  CLINICAL IMPRESSION / ASSESSMENT AND PLAN / ED COURSE  Medications ordered in the ED: Medications  sulfamethoxazole-trimethoprim (BACTRIM DS,SEPTRA DS) 800-160 MG per tablet 1 tablet (has no administration in time range)  iohexol (OMNIPAQUE) 300 MG/ML solution 100 mL (100 mLs Intravenous Contrast Given 02/24/18 1759)    Pertinent labs & imaging results that were available during my care of the patient were reviewed by me and considered in my medical decision  making (see chart for details).    Patient presents with worsening scrotal swelling and pain despite Levaquin for 48 hours.  He is obese and diabetic, concerning for complicated infection.  Will obtain CT scan and labs.  Clinical Course as of Feb 25 2035  Sat Feb 24, 2018  1844 CT unremarkable.  Will recommend he follow-up with urology in 2 days for further assessment of his large hydroceles which are symptomatic.  Continue the Levaquin for now.  INR is normal, vitals and white count are normal.   [PS]    Clinical Course User Index [PS] Carrie Mew, MD     ----------------------------------------- 8:39 PM on 02/24/2018 -----------------------------------------  Bactrim added for MRSA coverage.  He will need a INR recheck in the next week.  He will follow-up with urology on Monday.  Doubt torsion, no incarcerated hernia.  No evidence of Fournier's gangrene or deeper space infection. ____________________________________________   FINAL CLINICAL IMPRESSION(S) / ED DIAGNOSES    Final diagnoses:  Hydrocele, unspecified hydrocele type  Cellulitis of other specified site     ED Discharge Orders         Ordered    sulfamethoxazole-trimethoprim (BACTRIM DS) 800-160 MG tablet  2 times daily     02/24/18 2037    oxyCODONE-acetaminophen (PERCOCET) 5-325 MG tablet  Every 6 hours PRN     02/24/18 2037          Portions of this note were generated with dragon dictation software. Dictation errors may occur despite best attempts at proofreading.   Carrie Mew, MD 02/24/18 2042

## 2018-02-24 NOTE — ED Triage Notes (Signed)
L scotal swelling x 4 days, was here Thursday and has been taking antibiotics since yesterday. States swelling getting worse.

## 2018-02-24 NOTE — ED Triage Notes (Signed)
First Nurse Note:  Arrives with C/O groin swelling.  Seen through ED for same complaint two days ago.  States no improvement.

## 2018-02-25 LAB — URINE CULTURE

## 2018-02-26 ENCOUNTER — Emergency Department: Payer: Medicare Other

## 2018-02-26 ENCOUNTER — Other Ambulatory Visit: Payer: Self-pay

## 2018-02-26 ENCOUNTER — Inpatient Hospital Stay
Admission: EM | Admit: 2018-02-26 | Discharge: 2018-02-28 | DRG: 729 | Disposition: A | Discharge status: Home / Self Care | Payer: Medicare Other | Attending: Internal Medicine | Admitting: Internal Medicine

## 2018-02-26 ENCOUNTER — Encounter: Payer: Self-pay | Admitting: Emergency Medicine

## 2018-02-26 DIAGNOSIS — N200 Calculus of kidney: Secondary | ICD-10-CM | POA: Diagnosis present

## 2018-02-26 DIAGNOSIS — I10 Essential (primary) hypertension: Secondary | ICD-10-CM | POA: Diagnosis present

## 2018-02-26 DIAGNOSIS — Z9049 Acquired absence of other specified parts of digestive tract: Secondary | ICD-10-CM | POA: Diagnosis not present

## 2018-02-26 DIAGNOSIS — Z79899 Other long term (current) drug therapy: Secondary | ICD-10-CM

## 2018-02-26 DIAGNOSIS — N390 Urinary tract infection, site not specified: Secondary | ICD-10-CM | POA: Diagnosis present

## 2018-02-26 DIAGNOSIS — E785 Hyperlipidemia, unspecified: Secondary | ICD-10-CM | POA: Diagnosis present

## 2018-02-26 DIAGNOSIS — N430 Encysted hydrocele: Secondary | ICD-10-CM | POA: Diagnosis not present

## 2018-02-26 DIAGNOSIS — Z79891 Long term (current) use of opiate analgesic: Secondary | ICD-10-CM | POA: Diagnosis not present

## 2018-02-26 DIAGNOSIS — N5082 Scrotal pain: Secondary | ICD-10-CM

## 2018-02-26 DIAGNOSIS — N289 Disorder of kidney and ureter, unspecified: Secondary | ICD-10-CM

## 2018-02-26 DIAGNOSIS — N503 Cyst of epididymis: Secondary | ICD-10-CM | POA: Diagnosis present

## 2018-02-26 DIAGNOSIS — B9561 Methicillin susceptible Staphylococcus aureus infection as the cause of diseases classified elsewhere: Secondary | ICD-10-CM | POA: Diagnosis present

## 2018-02-26 DIAGNOSIS — Z7982 Long term (current) use of aspirin: Secondary | ICD-10-CM | POA: Diagnosis not present

## 2018-02-26 DIAGNOSIS — Z7901 Long term (current) use of anticoagulants: Secondary | ICD-10-CM | POA: Diagnosis not present

## 2018-02-26 DIAGNOSIS — Z86718 Personal history of other venous thrombosis and embolism: Secondary | ICD-10-CM

## 2018-02-26 DIAGNOSIS — N433 Hydrocele, unspecified: Secondary | ICD-10-CM | POA: Diagnosis not present

## 2018-02-26 DIAGNOSIS — N452 Orchitis: Secondary | ICD-10-CM | POA: Diagnosis present

## 2018-02-26 DIAGNOSIS — I7100 Dissection of unspecified site of aorta: Secondary | ICD-10-CM | POA: Diagnosis present

## 2018-02-26 DIAGNOSIS — R310 Gross hematuria: Secondary | ICD-10-CM | POA: Diagnosis present

## 2018-02-26 DIAGNOSIS — Z23 Encounter for immunization: Secondary | ICD-10-CM | POA: Diagnosis present

## 2018-02-26 DIAGNOSIS — Z888 Allergy status to other drugs, medicaments and biological substances status: Secondary | ICD-10-CM

## 2018-02-26 DIAGNOSIS — E119 Type 2 diabetes mellitus without complications: Secondary | ICD-10-CM | POA: Diagnosis present

## 2018-02-26 DIAGNOSIS — G8929 Other chronic pain: Secondary | ICD-10-CM | POA: Diagnosis present

## 2018-02-26 DIAGNOSIS — N179 Acute kidney failure, unspecified: Secondary | ICD-10-CM | POA: Diagnosis present

## 2018-02-26 DIAGNOSIS — Z7984 Long term (current) use of oral hypoglycemic drugs: Secondary | ICD-10-CM

## 2018-02-26 DIAGNOSIS — N23 Unspecified renal colic: Secondary | ICD-10-CM

## 2018-02-26 DIAGNOSIS — Z87442 Personal history of urinary calculi: Secondary | ICD-10-CM | POA: Diagnosis not present

## 2018-02-26 DIAGNOSIS — R609 Edema, unspecified: Secondary | ICD-10-CM

## 2018-02-26 DIAGNOSIS — R1030 Lower abdominal pain, unspecified: Secondary | ICD-10-CM | POA: Diagnosis present

## 2018-02-26 LAB — CBC
HCT: 32.8 % — ABNORMAL LOW (ref 39.0–52.0)
Hemoglobin: 9.5 g/dL — ABNORMAL LOW (ref 13.0–17.0)
MCH: 20.8 pg — ABNORMAL LOW (ref 26.0–34.0)
MCHC: 29 g/dL — ABNORMAL LOW (ref 30.0–36.0)
MCV: 71.9 fL — ABNORMAL LOW (ref 80.0–100.0)
Platelets: 244 10*3/uL (ref 150–400)
RBC: 4.56 MIL/uL (ref 4.22–5.81)
RDW: 19.6 % — ABNORMAL HIGH (ref 11.5–15.5)
WBC: 10.9 10*3/uL — ABNORMAL HIGH (ref 4.0–10.5)
nRBC: 0 % (ref 0.0–0.2)

## 2018-02-26 LAB — URINALYSIS, ROUTINE W REFLEX MICROSCOPIC
Bacteria, UA: NONE SEEN
Bilirubin Urine: NEGATIVE
Glucose, UA: >=500 mg/dL — AB
Ketones, ur: NEGATIVE mg/dL
Leukocytes,Ua: NEGATIVE
Nitrite: NEGATIVE
PROTEIN: 30 mg/dL — AB
RBC / HPF: >50 RBC/hpf — ABNORMAL HIGH (ref 0–5)
Specific Gravity, Urine: 1.024 (ref 1.005–1.030)
pH: 5 (ref 5.0–8.0)

## 2018-02-26 LAB — COMPREHENSIVE METABOLIC PANEL
ALT: 14 U/L (ref 0–44)
AST: 19 U/L (ref 15–41)
Albumin: 3.5 g/dL (ref 3.5–5.0)
Alkaline Phosphatase: 41 U/L (ref 38–126)
Anion gap: 10 (ref 5–15)
BUN: 19 mg/dL (ref 8–23)
CO2: 24 mmol/L (ref 22–32)
Calcium: 9.2 mg/dL (ref 8.9–10.3)
Chloride: 101 mmol/L (ref 98–111)
Creatinine, Ser: 1.32 mg/dL — ABNORMAL HIGH (ref 0.61–1.24)
GFR calc Af Amer: >60 mL/min (ref >60)
GFR calc non Af Amer: 57 mL/min — ABNORMAL LOW (ref >60)
Glucose, Bld: 255 mg/dL — ABNORMAL HIGH (ref 70–99)
Potassium: 4.1 mmol/L (ref 3.5–5.1)
Sodium: 135 mmol/L (ref 135–145)
TOTAL PROTEIN: 7.4 g/dL (ref 6.5–8.1)
Total Bilirubin: 0.7 mg/dL (ref 0.3–1.2)

## 2018-02-26 LAB — HEMOGLOBIN A1C
Hgb A1c MFr Bld: 8 % — ABNORMAL HIGH (ref 4.8–5.6)
Mean Plasma Glucose: 182.9 mg/dL

## 2018-02-26 LAB — GLUCOSE, CAPILLARY: Glucose-Capillary: 154 mg/dL — ABNORMAL HIGH (ref 70–99)

## 2018-02-26 LAB — PROTIME-INR
INR: 2.3
Prothrombin Time: 24.8 seconds — ABNORMAL HIGH (ref 11.4–15.2)

## 2018-02-26 MED ORDER — SODIUM CHLORIDE 0.9 % IV SOLN
INTRAVENOUS | Status: DC
  Administered 2018-02-26 – 2018-02-27 (×2): via INTRAVENOUS

## 2018-02-26 MED ORDER — ONDANSETRON HCL 4 MG PO TABS: 4.0000 mg | ORAL_TABLET | Freq: Four times a day (QID) | ORAL | Status: DC | PRN

## 2018-02-26 MED ORDER — HYDROCODONE-ACETAMINOPHEN 5-325 MG PO TABS
1.0000 | ORAL_TABLET | ORAL | Status: DC | PRN
  Administered 2018-02-26 – 2018-02-27 (×3): 1 via ORAL
  Filled 2018-02-26 (×3): qty 1

## 2018-02-26 MED ORDER — INSULIN ASPART 100 UNIT/ML ~~LOC~~ SOLN
0.0000 [IU] | Freq: Three times a day (TID) | SUBCUTANEOUS | Status: DC
  Administered 2018-02-27 – 2018-02-28 (×5): 2 [IU] via SUBCUTANEOUS
  Filled 2018-02-26 (×5): qty 1

## 2018-02-26 MED ORDER — HYDROMORPHONE HCL 1 MG/ML IJ SOLN
0.5000 mg | Freq: Once | INTRAMUSCULAR | Status: AC
  Administered 2018-02-26: 0.5 mg via INTRAVENOUS
  Filled 2018-02-26: qty 1

## 2018-02-26 MED ORDER — ASPIRIN EC 81 MG PO TBEC
162.5000 mg | DELAYED_RELEASE_TABLET | Freq: Every day | ORAL | Status: DC
  Filled 2018-02-26: qty 3

## 2018-02-26 MED ORDER — ONDANSETRON HCL 4 MG/2ML IJ SOLN: 4.0000 mg | Freq: Four times a day (QID) | INTRAMUSCULAR | Status: DC | PRN

## 2018-02-26 MED ORDER — HYDROMORPHONE HCL 1 MG/ML IJ SOLN
1.0000 mg | INTRAMUSCULAR | Status: DC | PRN
  Administered 2018-02-27: 1 mg via INTRAVENOUS
  Filled 2018-02-26: qty 1

## 2018-02-26 MED ORDER — LEVOFLOXACIN IN D5W 750 MG/150ML IV SOLN
750.0000 mg | Freq: Once | INTRAVENOUS | Status: AC
  Administered 2018-02-26: 750 mg via INTRAVENOUS
  Filled 2018-02-26: qty 150

## 2018-02-26 MED ORDER — LEVOFLOXACIN 500 MG PO TABS
500.0000 mg | ORAL_TABLET | Freq: Every day | ORAL | Status: DC
  Administered 2018-02-27: 500 mg via ORAL
  Filled 2018-02-26 (×2): qty 1

## 2018-02-26 MED ORDER — ALLOPURINOL 100 MG PO TABS
100.0000 mg | ORAL_TABLET | Freq: Every day | ORAL | Status: DC
  Administered 2018-02-27: 100 mg via ORAL
  Filled 2018-02-26: qty 1

## 2018-02-26 MED ORDER — SODIUM CHLORIDE 0.9 % IV BOLUS
1000.0000 mL | Freq: Once | INTRAVENOUS | Status: AC
  Administered 2018-02-26: 1000 mL via INTRAVENOUS

## 2018-02-26 MED ORDER — PNEUMOCOCCAL VAC POLYVALENT 25 MCG/0.5ML IJ INJ
0.5000 mL | INJECTION | INTRAMUSCULAR | Status: AC
  Administered 2018-02-28: 0.5 mL via INTRAMUSCULAR
  Filled 2018-02-26: qty 0.5

## 2018-02-26 MED ORDER — SULFAMETHOXAZOLE-TRIMETHOPRIM 800-160 MG PO TABS
1.0000 | ORAL_TABLET | Freq: Two times a day (BID) | ORAL | Status: DC
  Administered 2018-02-26 – 2018-02-27 (×2): 1 via ORAL
  Filled 2018-02-26 (×3): qty 1

## 2018-02-26 MED ORDER — POLYETHYLENE GLYCOL 3350 17 G PO PACK: 17.0000 g | PACK | Freq: Every day | ORAL | Status: DC | PRN

## 2018-02-26 MED ORDER — ACETAMINOPHEN 650 MG RE SUPP: 650.0000 mg | Freq: Four times a day (QID) | RECTAL | Status: DC | PRN

## 2018-02-26 MED ORDER — INSULIN ASPART 100 UNIT/ML ~~LOC~~ SOLN: 0.0000 [IU] | Freq: Every day | SUBCUTANEOUS | Status: DC

## 2018-02-26 MED ORDER — ACETAMINOPHEN 325 MG PO TABS: 650.0000 mg | ORAL_TABLET | Freq: Four times a day (QID) | ORAL | Status: DC | PRN

## 2018-02-26 NOTE — Consult Note (Signed)
H&P Physician requesting consult: Eula Listen, MD  Chief Complaint: Left scrotal pain, left hydrocele  History of Present Illness: 63 year old male currently on warfarin for history of DVT presents with a one-week history of left-sided scrotal swelling and pain.  He states that he has a difficult time walking and that the pain is severe.  INR is 2.3.  He denies any trauma to the area.  He also admits to history of nephrolithiasis.  This is his third presentation to the emergency department in the last week.  On 02/22/2018, he underwent a scrotal ultrasound that confirmed a left-sided hydrocele.  Had a small right-sided hydrocele.  CT scan of the abdomen and pelvis was performed as well which revealed nonobstructing renal calculus but no ureteral calculi.  He does admit to gross hematuria.  He had no associated pain but thinks that he passed a stone yesterday.  Creatinine is mildly elevated.  He is afebrile with vital signs stable, no leukocytosis.  Diabetes appears to be inadequately controlled.  Scrotal ultrasound today showed no evidence of testicular mass or torsion.  There were multiple bilateral epididymal cyst.  Bilateral hydroceles were observed which were larger left.  Past Medical History:  Diagnosis Date  . Arthritis   . Ascending aortic dissection ([place])   . Diabetes mellitus without complication ([place])   . 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 Hyperlipemia   . Hypertension   . Kidney stones    Past Surgical History:  Procedure Laterality Date  . CARDIAC SURGERY    . COLONOSCOPY WITH PROPOFOL N/A 04/04/2017   Procedure: COLONOSCOPY WITH PROPOFOL;  Surgeon: Lucilla Lame, MD;  Location: [facility] ENDOSCOPY;  Service: Endoscopy;  Laterality: N/A;  . DENTAL SURGERY    . ESOPHAGOGASTRODUODENOSCOPY (EGD) WITH PROPOFOL N/A 04/04/2017   Procedure: ESOPHAGOGASTRODUODENOSCOPY (EGD) WITH PROPOFOL;  Surgeon: Lucilla Lame, MD;  Location: [facility] ENDOSCOPY;  Service:  Endoscopy;  Laterality: N/A;  . LAPAROSCOPIC RIGHT HEMI COLECTOMY Right 08/17/2017   Procedure: LAPAROSCOPIC  RIGHT HEMI COLECTOMY, COLONOSCOPY, UMBILICAL HERNIA REPAIR;  Surgeon: Ileana Roup, MD;  Location: [facility] ORS;  Service: General;  Laterality: Right;  . REPAIR THORACIC AORTA      Home Medications:  (Not in a hospital admission)  Allergies:  Allergies  Allergen Reactions  . Chlorthalidone Swelling    Testicular/Scrotum edema  . Hydralazine Other (See Comments)    GI Upset (intolerance)  . Lisinopril Swelling    Facial Edema (intolerance)    No family history on file. Social History:  reports that he has never smoked. He has never used smokeless tobacco. He reports current alcohol use of about 28.0 standard drinks of alcohol per week. He reports that he does not use drugs.  ROS: A complete review of systems was performed.  All systems are negative except for pertinent findings as noted. ROS   Physical Exam:  Vital signs in last 24 hours: Temp:  [98.1 F (36.7 C)] 98.1 F (36.7 C) (02/24 1021) Pulse Rate:  [63-65] 63 (02/24 1200) Resp:  [16] 16 (02/24 1021) BP: (97-130)/(66-88) 130/88 (02/24 1200) SpO2:  [95 %-98 %] 98 % (02/24 1200) Weight:  [127 kg] 127 kg (02/24 1018) General:  Alert and oriented, No acute distress HEENT: Normocephalic, atraumatic Neck: No JVD or lymphadenopathy Cardiovascular: Regular rate and rhythm Lungs: Regular rate and effort Abdomen: Soft, nontender, nondistended, no abdominal masses, obese Genitourinary: Phallus is normal.  He has left hemiscrotal swelling that is tender to palpation  consistent with hydrocele.  No evidence of Fournier's or obvious superficial scrotal infection.  Digital rectal exam deferred Back: No CVA tenderness Extremities: No edema Neurologic: Grossly intact  Laboratory Data:  Results for orders placed or performed during the hospital encounter of 02/26/18 (from the past 24 hour(s))  Comprehensive metabolic  panel     Status: Abnormal   Collection Time: 02/26/18 10:30 AM  Result Value Ref Range   Sodium 135 135 - 145 mmol/L   Potassium 4.1 3.5 - 5.1 mmol/L   Chloride 101 98 - 111 mmol/L   CO2 24 22 - 32 mmol/L   Glucose, Bld 255 (H) 70 - 99 mg/dL   BUN 19 8 - 23 mg/dL   Creatinine, Ser 1.32 (H) 0.61 - 1.24 mg/dL   Calcium 9.2 8.9 - 10.3 mg/dL   Total Protein 7.4 6.5 - 8.1 g/dL   Albumin 3.5 3.5 - 5.0 g/dL   AST 19 15 - 41 U/L   ALT 14 0 - 44 U/L   Alkaline Phosphatase 41 38 - 126 U/L   Total Bilirubin 0.7 0.3 - 1.2 mg/dL   GFR calc non Af Amer 57 (L) >60 mL/min   GFR calc Af Amer >60 >60 mL/min   Anion gap 10 5 - 15  CBC     Status: Abnormal   Collection Time: 02/26/18 10:30 AM  Result Value Ref Range   WBC 10.9 (H) 4.0 - 10.5 K/uL   RBC 4.56 4.22 - 5.81 MIL/uL   Hemoglobin 9.5 (L) 13.0 - 17.0 g/dL   HCT 32.8 (L) 39.0 - 52.0 %   MCV 71.9 (L) 80.0 - 100.0 fL   MCH 20.8 (L) 26.0 - 34.0 pg   MCHC 29.0 (L) 30.0 - 36.0 g/dL   RDW 19.6 (H) 11.5 - 15.5 %   Platelets 244 150 - 400 K/uL   nRBC 0.0 0.0 - 0.2 %  Urinalysis, Routine w reflex microscopic     Status: Abnormal   Collection Time: 02/26/18 12:22 PM  Result Value Ref Range   Color, Urine YELLOW (A) YELLOW   APPearance CLEAR (A) CLEAR   Specific Gravity, Urine 1.024 1.005 - 1.030   pH 5.0 5.0 - 8.0   Glucose, UA >=500 (A) NEGATIVE mg/dL   Hgb urine dipstick LARGE (A) NEGATIVE   Bilirubin Urine NEGATIVE NEGATIVE   Ketones, ur NEGATIVE NEGATIVE mg/dL   Protein, ur 30 (A) NEGATIVE mg/dL   Nitrite NEGATIVE NEGATIVE   Leukocytes,Ua NEGATIVE NEGATIVE   RBC / HPF >50 (H) 0 - 5 RBC/hpf   WBC, UA 6-10 0 - 5 WBC/hpf   Bacteria, UA NONE SEEN NONE SEEN   Squamous Epithelial / LPF 0-5 0 - 5   Mucus PRESENT    Budding Yeast PRESENT    Hyaline Casts, UA PRESENT   Protime-INR     Status: Abnormal   Collection Time: 02/26/18 12:58 PM  Result Value Ref Range   Prothrombin Time 24.8 (H) 11.4 - 15.2 seconds   INR 2.3    Recent  Results (from the past 240 hour(s))  Urine culture     Status: Abnormal   Collection Time: 02/22/18  5:19 PM  Result Value Ref Range Status   Specimen Description   Final    URINE, RANDOM Performed at [facility], [address], [place], [place] [address]    Special Requests   Final    NONE Performed at [facility], [address] [place], [place] [address]  Culture >=100,000 COLONIES/mL STAPHYLOCOCCUS AUREUS (A)  Final   Report Status 02/25/2018 FINAL  Final   Organism ID, Bacteria STAPHYLOCOCCUS AUREUS (A)  Final      Susceptibility   Staphylococcus aureus - MIC*    CIPROFLOXACIN <=0.5 SENSITIVE Sensitive     GENTAMICIN <=0.5 SENSITIVE Sensitive     NITROFURANTOIN <=16 SENSITIVE Sensitive     OXACILLIN <=0.25 SENSITIVE Sensitive     TETRACYCLINE <=1 SENSITIVE Sensitive     VANCOMYCIN 1 SENSITIVE Sensitive     TRIMETH/SULFA <=10 SENSITIVE Sensitive     CLINDAMYCIN RESISTANT Resistant     RIFAMPIN <=0.5 SENSITIVE Sensitive     Inducible Clindamycin POSITIVE Resistant     * >=100,000 COLONIES/mL STAPHYLOCOCCUS AUREUS   Creatinine: Recent Labs    02/24/18 1209 02/26/18 1030  CREATININE 1.09 1.32*   CT scan of the abdomen and pelvis from 02/22/2018 personally reviewed as well as his ultrasound from today.  Impression/Assessment:  Left hydrocele gross hematuria, resolved Acute renal insufficiency Left-sided testicular pain History of nephrolithiasis  Plan:  No acute surgical intervention necessary.  He is being admitted for pain control and monitoring of blood sugar and renal function.  If renal insufficiency does not improve, consider renal bladder ultrasound.  Would not perform a CT scan unless there is hydronephrosis.  Once renal function improves, recommend outpatient hematuria work-up with cystoscopy and CT IVP.  I discussed with him the management of left-sided hydrocele.  He is currently on warfarin and his INR is 2.3.  1 option is scrotal  hydrocelectomy which would require him to be off warfarin.  There is no urgent indication for this.  Another option would be percutaneous drainage but this has a high rate of recurrence.  Also no urgent indication for this especially if he can come off blood thinner.  If pain does not improve, I would favor percutaneous drainage in the more urgent setting.  If we are able to get his pain under control, can consider hydrocelectomy as an outpatient assuming he can come off blood thinner for a period of time.  He also needs good control of his diabetes.  Marton Redwood, III 02/26/2018, 5:42 PM

## 2018-02-26 NOTE — ED Triage Notes (Signed)
Says pain left groin and has blood in urine since yesterday.  Says this happens when he gets a kidney stone.

## 2018-02-26 NOTE — ED Provider Notes (Addendum)
[facility] Emergency Department Provider Note  ____________________________________________  Time seen: Approximately 12:39 PM  I have reviewed the triage vital signs and the nursing notes.   HISTORY  Chief Complaint Hematuria and Groin Pain    HPI BASCOM BIEL is a 63 y.o. male diagnosed with bilateral left greater than right hydroceles on 02/22/2018, with repeat presentation 02/24/2018, presenting for scrotal pain and swelling.  The patient was seen here 2/20 for scrotal pain and ultrasound did not show any torsion; he did have evidence of complex left greater than right hydroceles.  Was discharged with Levaquin.  He did have a CT scan which did not show any other acute process.  On 2/22, the patient was more symptomatic and came back.  After reassuring examination and repeat CT scan not showing any stones or evidence of Fournier's growing gangrene or any other acute process, the patient was discharged with a prescription for Bactrim for double coverage and instructions to follow-up with urology on Monday.  The patient states that he has been unable to fill his prescription due to the amount of pain and swelling that he has because he is unable to walk.  He has not had any fevers or chills, nausea or vomiting.  Yesterday, he began to have hematuria and passed a small stone which he brought with him.  He denies any nausea or vomiting.  Past Medical History:  Diagnosis Date  . Arthritis   . Ascending aortic dissection ([place])   . Diabetes mellitus without complication ([place])   . 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 Hyperlipemia   . Hypertension   . Kidney stones     Patient Active Problem List   Diagnosis Date Noted  . Colon polyp 08/17/2017  . Anemia   . Polyp of sigmoid colon   . Benign neoplasm of descending colon   . Colon neoplasm   . Abdominal pain, epigastric   . Gastritis without bleeding   . Nephrolithiasis 11/06/2015   . Cough with sputum 01/14/2015  . Fatigue 01/14/2015  . Obesity 01/14/2015  . GERD (gastroesophageal reflux disease) 04/24/2014  . Long term current use of anticoagulant therapy 04/24/2014  . Type 2 diabetes mellitus with complication ([place]) 76/28/3151  . Ascending aortic dissection ([place]) 10/04/2013  . HTN (hypertension) 05/28/2013  . Pulmonary emboli ([place]) 06/26/2012  . Pulmonary edema 06/23/2012  . Respiratory failure with hypoxia ([place]) 06/23/2012  . Osteoarthritis of left hip 04/19/2012  . Right leg DVT ([place]) 06/17/2011    Past Surgical History:  Procedure Laterality Date  . CARDIAC SURGERY    . COLONOSCOPY WITH PROPOFOL N/A 04/04/2017   Procedure: COLONOSCOPY WITH PROPOFOL;  Surgeon: Lucilla Lame, MD;  Location[facility] ENDOSCOPY;  Service: Endoscopy;  Laterality: N/A;  . DENTAL SURGERY    . ESOPHAGOGASTRODUODENOSCOPY (EGD) WITH PROPOFOL N/A 04/04/2017   Procedure: ESOPHAGOGASTRODUODENOSCOPY (EGD) WITH PROPOFOL;  Surgeon: Lucilla Lame, MD;  Location: [facility] ENDOSCOPY;  Service: Endoscopy;  Laterality: N/A;  . LAPAROSCOPIC RIGHT HEMI COLECTOMY Right 08/17/2017   Procedure: LAPAROSCOPIC  RIGHT HEMI COLECTOMY, COLONOSCOPY, UMBILICAL HERNIA REPAIR;  Surgeon: Ileana Roup, MD;  Location: [facility] ORS;  Service: General;  Laterality: Right;  . REPAIR THORACIC AORTA      Current Outpatient Rx  . Order #: [id number] Class: Historical Med  . Order #: [id number] Class: Historical Med  . Order #: [id number] Class: Historical Med  . Order #: [id number] Class: Historical Med  . Order #: [id number] Class: Historical Med  .  Order #: [id number] Class: Normal  . Order #: [id number] Class: Normal  . Order #: [id number] Class: Historical Med  . Order #: [id number] Class: Normal  . Order #: [id number] Class: Print  . Order #: [id number] Class: Historical Med  . Order #: [id number] Class: Historical Med  . Order #: [id number] Class: Historical Med  . Order #: [id number] Class: Normal  . Order #: [id number] Class: Historical  Med    Allergies Chlorthalidone; Hydralazine; and Lisinopril  No family history on file.  Social History Social History   Tobacco Use  . Smoking status: Never Smoker  . Smokeless tobacco: Never Used  Substance Use Topics  . Alcohol use: Yes    Alcohol/week: 28.0 standard drinks    Types: 28 Cans of beer per week  . Drug use: No    Review of Systems Constitutional: No fever/chills.  S or syncope. Eyes: No visual changes. ENT: No sore throat. No congestion or rhinorrhea. Cardiovascular: Denies chest pain. Denies palpitations. Respiratory: Denies shortness of breath.  No cough. Gastrointestinal: No abdominal pain.  No flank pain.  No nausea, no vomiting.  No diarrhea.  No constipation. Genitourinary: + urinary frequency.  Positive hematuria.  Positive passage of renal stone.  Positive left greater than right scrotal swelling and pain. Musculoskeletal: Negative for back pain. Skin: Negative for rash. Neurological: Negative for headaches. No focal numbness, tingling or weakness.     ____________________________________________   PHYSICAL EXAM:  VITAL SIGNS: ED Triage Vitals  Enc Vitals Group     BP 02/26/18 1021 97/66     Pulse Rate 02/26/18 1021 65     Resp 02/26/18 1021 16     Temp 02/26/18 1021 98.1 F (36.7 C)     Temp Source 02/26/18 1021 Oral     SpO2 02/26/18 1021 95 %     Weight 02/26/18 1018 279 lb 15.8 oz (127 kg)     Height 02/26/18 1018 5\' 10"  (1.778 m)     Head Circumference --      Peak Flow --      Pain Score 02/26/18 1018 10     Pain Loc --      Pain Edu? --      Excl. in [place]? --     Constitutional: Alert and oriented. Answers questions appropriately.  Chronically ill-appearing Eyes: Conjunctivae are normal.  EOMI. No scleral icterus. Head: Atraumatic. Nose: No congestion/rhinnorhea. Mouth/Throat: Mucous membranes are moist.  Neck: No stridor.  Supple.   Cardiovascular: Normal rate, regular rhythm. No murmurs, rubs or gallops.  Respiratory:  Normal respiratory effort.  No accessory muscle use or retractions. Lungs CTAB.  No wheezes, rales or ronchi. Gastrointestinal: Soft, nontender and nondistended.  No guarding or rebound.  No peritoneal signs. Genitourinary: Normal-appearing penis without lesions.  The patient has gross swelling of the scrotal sac with significant tenderness and boggy sensation in the left testicle, and swelling with pain on the right testicle as well.  The patient is unable to tolerate inguinal canal evaluation.  There are no external skin lesions. Musculoskeletal: No LE edema.  Neurologic:  A&Ox3.  Speech is clear.  Face and smile are symmetric.  EOMI.  Moves all extremities well. Skin:  Skin is warm, dry and intact. No rash noted. Psychiatric: Mood and affect are normal. Speech and behavior are normal.  Normal judgement.  ____________________________________________   LABS (all labs ordered are listed, but only abnormal results are displayed)  Labs Reviewed  COMPREHENSIVE METABOLIC PANEL - Abnormal; Notable for the following components:      Result  Value   Glucose, Bld 255 (*)    Creatinine, Ser 1.32 (*)    GFR calc non Af Amer 57 (*)    All other components within normal limits  CBC - Abnormal; Notable for the following components:   WBC 10.9 (*)    Hemoglobin 9.5 (*)    HCT 32.8 (*)    MCV 71.9 (*)    MCH 20.8 (*)    MCHC 29.0 (*)    RDW 19.6 (*)    All other components within normal limits  URINALYSIS, ROUTINE W REFLEX MICROSCOPIC - Abnormal; Notable for the following components:   Color, Urine YELLOW (*)    APPearance CLEAR (*)    Glucose, UA >=500 (*)    Hgb urine dipstick LARGE (*)    Protein, ur 30 (*)    RBC / HPF >50 (*)    All other components within normal limits  PROTIME-INR - Abnormal; Notable for the following components:   Prothrombin Time 24.8 (*)    All other components within normal limits   ____________________________________________  EKG  Not  indicated ____________________________________________  RADIOLOGY  US Scrotum W/doppler  Result Date: 02/26/2018 CLINICAL DATA:  Scrotal pain and swelling. EXAM: SCROTAL ULTRASOUND DOPPLER ULTRASOUND OF THE TESTICLES TECHNIQUE: Complete ultrasound examination of the testicles, epididymis, and other scrotal structures was performed. Color and spectral Doppler ultrasound were also utilized to evaluate blood flow to the testicles. COMPARISON:  None. FINDINGS: Right testicle Measurements: 5.7 x 3.3 x 3.3 cm. No mass or microlithiasis visualized. Left testicle Measurements: 4.5 x 3.5 x 3.4 cm. No mass or microlithiasis visualized. Right epididymis: Multiple epididymal cysts are noted with the largest measuring 7 mm. Left epididymis: Multiple epididymal cysts are noted with the largest measuring 7 mm. Hydrocele: Bilateral hydroceles are noted, with left greater than right. Varicocele:  None visualized. Pulsed Doppler interrogation of both testes demonstrates normal low resistance arterial and venous waveforms bilaterally. IMPRESSION: No evidence of testicular mass or torsion. Multiple bilateral epididymal cysts are noted. Bilateral hydroceles are noted, left greater than right. Electronically Signed   By: Marijo Conception, M.D.   On: 02/26/2018 13:57    ____________________________________________   PROCEDURES  Procedure(s) performed: None  Procedures  Critical Care performed: No ____________________________________________   INITIAL IMPRESSION / ASSESSMENT AND PLAN / ED COURSE  Pertinent labs & imaging results that were available during my care of the patient were reviewed by me and considered in my medical decision making (see chart for details).  63 y.o. male with a history of bilateral hydroceles on Levaquin presenting for worsening scrotal swelling and pain.  Patient also has been passing kidney stones.  Today, I am concerned that the pain patient is unable to be compliant with his regimen,  and that his scrotum has been increasing in size.  I will repeat his ultrasound and talk to urology.  In addition, he has some renal insufficiency which may be due to dehydration or his kidney stone.  ----------------------------------------- 3:33 PM on 02/26/2018 -----------------------------------------  The patient's ultrasound shows bilateral hydroceles, as well as epididymal cysts.  I have tried to contact Dr. Gloriann Loan, the urologist on-call; unfortunately he is in a procedure but I have left a detailed description of the patient's evaluation with his nurse, and he will call back.  The patient will be signed out to Dr. Kerman Passey regarding his urologic recommendations, but I will admit the patient to the hospitalist for ongoing pain control and fluids for his renal insufficiency.  ____________________________________________  FINAL CLINICAL IMPRESSION(S) / ED DIAGNOSES  Final diagnoses:  Epididymal cyst  Bilateral hydrocele  Acute renal insufficiency         NEW MEDICATIONS STARTED DURING THIS VISIT:  New Prescriptions   No medications on file      Eula Listen, MD 02/26/18 1535    Eula Listen, MD 02/26/18 [phone]

## 2018-02-26 NOTE — ED Notes (Signed)
Report received from [place], [place]. Pt care assumed at this time.

## 2018-02-26 NOTE — ED Notes (Signed)
Pt was here Thursday for left sided groin swelling was given antibiotic and pain medicine - filled prescription Friday morning and took around noon Friday - pt was seen Saturday as well and given another antibiotic  - did not fill pick up prescription d/t scrotal pain and swelling worsened to the point of not being able to ambulate. Pt is now having hematuria "after passing kidney stone yesterday" - pt has kidney stone in envelope to show provider

## 2018-02-26 NOTE — ED Notes (Signed)
Pt off the floor at this time with NA and family.

## 2018-02-26 NOTE — H&P (Signed)
[place] [facility] [place] NAME: Shane Bean    MR#:  [id number]  DATE OF BIRTH:  February 21, 1955  DATE OF ADMISSION:  02/26/2018  PRIMARY CARE PHYSICIAN: Janyth Pupa, NP   REQUESTING/REFERRING PHYSICIAN: dr Mariea Clonts  CHIEF COMPLAINT:   Assistant groin pain HISTORY OF PRESENT ILLNESS:  Shane Bean  is a 63 y.o. male with a known history of DVT on Coumadin who presents to the emergency room due to above complaint. Patient has been seen twice in the emergency room due to scrotal pain and swelling.  He was diagnosed with bilateral hydroceles.  He was discharged on Levaquin on 20 February and then again seen on the 22nd due to symptomatic hydroceles and sent home with Bactrim in addition to Levaquin for MRSA coverage.  He was supposed to follow-up with urology as an outpatient this week.  He had a CT scan that did not show evidence of stones or Fourneirs gangrene. He presents today complaining of hematuria and felt that he passed a small stone.  He also has scrotal pain and swelling and is unable to walk.  He denies fever, chills, nausea or vomiting.  He denies hematochezia. ER physician has spoken with urologist who is currently recommending pain control and IV fluids. Urologist will see patient later today and possibly schedule surgery for tomorrow depending on the INR. PAST MEDICAL HISTORY:   Past Medical History:  Diagnosis Date  . Arthritis   . Ascending aortic dissection ([place])   . Diabetes mellitus without complication ([place])   . 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 Hyperlipemia   . Hypertension   . Kidney stones     PAST SURGICAL HISTORY:   Past Surgical History:  Procedure Laterality Date  . CARDIAC SURGERY    . COLONOSCOPY WITH PROPOFOL N/A 04/04/2017   Procedure: COLONOSCOPY WITH PROPOFOL;  Surgeon: Lucilla Lame, MD;  Location: [facility] ENDOSCOPY;  Service: Endoscopy;  Laterality: N/A;  . DENTAL SURGERY     . ESOPHAGOGASTRODUODENOSCOPY (EGD) WITH PROPOFOL N/A 04/04/2017   Procedure: ESOPHAGOGASTRODUODENOSCOPY (EGD) WITH PROPOFOL;  Surgeon: Lucilla Lame, MD;  Location[facility] ENDOSCOPY;  Service: Endoscopy;  Laterality: N/A;  . LAPAROSCOPIC RIGHT HEMI COLECTOMY Right 08/17/2017   Procedure: LAPAROSCOPIC  RIGHT HEMI COLECTOMY, COLONOSCOPY, UMBILICAL HERNIA REPAIR;  Surgeon: Ileana Roup, MD;  Location: [facility] ORS;  Service: General;  Laterality: Right;  . REPAIR THORACIC AORTA      SOCIAL HISTORY:   Social History   Tobacco Use  . Smoking status: Never Smoker  . Smokeless tobacco: Never Used  Substance Use Topics  . Alcohol use: Yes    Alcohol/week: 28.0 standard drinks    Types: 28 Cans of beer per week    FAMILY HISTORY:  No family history on file.  DRUG ALLERGIES:   Allergies  Allergen Reactions  . Chlorthalidone Swelling    Testicular/Scrotum edema  . Hydralazine Other (See Comments)    GI Upset (intolerance)  . Lisinopril Swelling    Facial Edema (intolerance)    REVIEW OF SYSTEMS:   Review of Systems  Constitutional: Negative.  Negative for chills, fever and malaise/fatigue.  HENT: Negative.  Negative for ear discharge, ear pain, hearing loss, nosebleeds and sore throat.   Eyes: Negative.  Negative for blurred vision and pain.  Respiratory: Negative.  Negative for cough, hemoptysis, shortness of breath and wheezing.   Cardiovascular: Negative.  Negative for chest pain, palpitations and leg  swelling.  Gastrointestinal: Negative.  Negative for abdominal pain, blood in stool, diarrhea, nausea and vomiting.  Genitourinary: Positive for hematuria. Negative for dysuria.       Scrotal pain and edema  Musculoskeletal: Negative.  Negative for back pain.  Skin: Negative.   Neurological: Negative for dizziness, tremors, speech change, focal weakness, seizures and headaches.  Endo/Heme/Allergies: Negative.  Does not bruise/bleed easily.  Psychiatric/Behavioral: Negative.   Negative for depression, hallucinations and suicidal ideas.    MEDICATIONS AT HOME:   Prior to Admission medications   Medication Sig Start Date End Date Taking? Authorizing Provider  allopurinol (ZYLOPRIM) 100 MG tablet Take 100 mg by mouth daily at 3 pm.   Yes [provider]  amLODipine (NORVASC) 10 MG tablet Take 10 mg by mouth at bedtime.   Yes [provider]  aspirin EC 325 MG tablet Take 162.5 mg by mouth daily.  10/15/13  Yes [provider]  doxazosin (CARDURA) 8 MG tablet Take 8 mg by mouth at bedtime.    Yes [provider]  glipiZIDE (GLUCOTROL) 5 MG tablet Take 10 mg by mouth 2 (two) times daily.   Yes [provider]  HYDROcodone-acetaminophen (NORCO) 5-325 MG tablet Take 1 tablet by mouth every 4 (four) hours as needed for moderate pain. 02/22/18  Yes Merlyn Lot, MD  levofloxacin (LEVAQUIN) 500 MG tablet Take 1 tablet (500 mg total) by mouth daily for 10 days. 02/22/18 03/04/18 Yes Merlyn Lot, MD  metoprolol tartrate (LOPRESSOR) 100 MG tablet Take 100 mg by mouth 2 (two) times daily.   Yes [provider]  oxyCODONE-acetaminophen (PERCOCET) 5-325 MG tablet Take 1 tablet by mouth every 6 (six) hours as needed for severe pain. 02/24/18 02/24/19 Yes Carrie Mew, MD  predniSONE (DELTASONE) 50 MG tablet Take one 50 mg tablet once daily for the next five days. 02/14/18  Yes Vallarie Mare M, PA-C  ranitidine (ZANTAC) 150 MG capsule Take 150 mg by mouth daily at 3 pm.  10/15/13  Yes [provider]  simvastatin (ZOCOR) 10 MG tablet Take 10 mg by mouth every evening.   Yes [provider]  spironolactone (ALDACTONE) 25 MG tablet Take 25 mg by mouth at bedtime.   Yes [provider]  warfarin (COUMADIN) 6 MG tablet Take 6 mg by mouth every evening.   Yes [provider]  sulfamethoxazole-trimethoprim (BACTRIM DS) 800-160 MG tablet Take 1 tablet by mouth 2 (two) times daily. 02/24/18    Carrie Mew, MD      VITAL SIGNS:  Blood pressure 130/88, pulse 63, temperature 98.1 F (36.7 C), temperature source Oral, resp. rate 16, height 5\' 10"  (1.778 m), weight 127 kg, SpO2 98 %.  PHYSICAL EXAMINATION:   Physical Exam Constitutional:      General: He is not in acute distress. HENT:     Head: Normocephalic.  Eyes:     General: No scleral icterus. Neck:     Musculoskeletal: Normal range of motion and neck supple.     Vascular: No JVD.     Trachea: No tracheal deviation.  Cardiovascular:     Rate and Rhythm: Normal rate and regular rhythm.     Heart sounds: Normal heart sounds. No murmur. No friction rub. No gallop.   Pulmonary:     Effort: Pulmonary effort is normal. No respiratory distress.     Breath sounds: Normal breath sounds. No wheezing or rales.  Chest:     Chest wall: No tenderness.  Abdominal:  General: Bowel sounds are normal. There is no distension.     Palpations: Abdomen is soft. There is no mass.     Tenderness: There is no abdominal tenderness. There is no guarding or rebound.  Genitourinary:    Comments: gross swelling of the scrotal sac with significant tenderness both testicles Musculoskeletal: Normal range of motion.  Skin:    General: Skin is warm.     Findings: No erythema or rash.  Neurological:     Mental Status: He is alert and oriented to person, place, and time.  Psychiatric:        Mood and Affect: Mood normal.        Behavior: Behavior normal.        Judgment: Judgment normal.       LABORATORY PANEL:   CBC Recent Labs  Lab 02/26/18 1030  WBC 10.9*  HGB 9.5*  HCT 32.8*  PLT 244   ------------------------------------------------------------------------------------------------------------------  Chemistries  Recent Labs  Lab 02/26/18 1030  NA 135  K 4.1  CL 101  CO2 24  GLUCOSE 255*  BUN 19  CREATININE 1.32*  CALCIUM 9.2  AST 19  ALT 14  ALKPHOS 41  BILITOT 0.7    ------------------------------------------------------------------------------------------------------------------  Cardiac Enzymes No results for input(s): TROPONINI in the last 168 hours. ------------------------------------------------------------------------------------------------------------------  RADIOLOGY:  Ct Pelvis W Contrast  Result Date: 02/24/2018 CLINICAL DATA:  Left testicle swelling. EXAM: CT PELVIS WITH CONTRAST TECHNIQUE: Multidetector CT imaging of the pelvis was performed using the standard protocol following the bolus administration of intravenous contrast. CONTRAST:  163mL OMNIPAQUE IOHEXOL 300 MG/ML  SOLN COMPARISON:  02/22/2018 FINDINGS: Urinary Tract: Visualized distal ureters are decompressed as is the urinary bladder, grossly unremarkable. Bowel: Diverticulosis in the visualized sigmoid colon and cecum. Visualized small bowel decompressed. Appendix is normal. Vascular/Lymphatic: Atherosclerotic change.  No aneurysm. Reproductive: Mild prominence of the prostate. Bilateral hydroceles noted, left greater than right. This is similar to prior study. Other:  No free fluid or free air. Musculoskeletal: No acute bony abnormality. IMPRESSION: Bilateral hydroceles, left larger than right. This is similar to recent study. Diverticulosis in the visualized sigmoid colon and cecum. Electronically Signed   By: Rolm Baptise M.D.   On: 02/24/2018 18:10   US Scrotum W/doppler  Result Date: 02/26/2018 CLINICAL DATA:  Scrotal pain and swelling. EXAM: SCROTAL ULTRASOUND DOPPLER ULTRASOUND OF THE TESTICLES TECHNIQUE: Complete ultrasound examination of the testicles, epididymis, and other scrotal structures was performed. Color and spectral Doppler ultrasound were also utilized to evaluate blood flow to the testicles. COMPARISON:  None. FINDINGS: Right testicle Measurements: 5.7 x 3.3 x 3.3 cm. No mass or microlithiasis visualized. Left testicle Measurements: 4.5 x 3.5 x 3.4 cm. No mass or  microlithiasis visualized. Right epididymis: Multiple epididymal cysts are noted with the largest measuring 7 mm. Left epididymis: Multiple epididymal cysts are noted with the largest measuring 7 mm. Hydrocele: Bilateral hydroceles are noted, with left greater than right. Varicocele:  None visualized. Pulsed Doppler interrogation of both testes demonstrates normal low resistance arterial and venous waveforms bilaterally. IMPRESSION: No evidence of testicular mass or torsion. Multiple bilateral epididymal cysts are noted. Bilateral hydroceles are noted, left greater than right. Electronically Signed   By: Marijo Conception, M.D.   On: 02/26/2018 13:57    EKG:   Orders placed or performed during the hospital encounter of 02/16/17  . EKG 12-Lead  . EKG 12-Lead  . ED EKG within 10 minutes  . ED EKG within 10 minutes  .  EKG    IMPRESSION AND PLAN:   63 year old male with history of DVT on Coumadin who presents for the third time to the emergency room due to groin pain and diagnosed with bilateral hydrocele.  1.  Bilateral hydrocele: Further management as per urology Pain control Elevate scrotum Continue Bactrim and Levaquin   2.  Acute kidney injury in the setting of poor p.o. intake due to pain: Continue IV fluids and hold nephrotoxic medications  3.  History of DVT on Coumadin: Hold Coumadin for presumed surgery May need to transition to heparin depending on INR  4.  Diabetes: Hold diabetic medications Diabetes nurse consult Sliding scale initiated  5.  Essential hypertension: Continue Norvasc, Cardura, metoprolol Hold Aldactone for now  6.  Hyperlipidemia: Continue statin  All the records are reviewed and case discussed with ED provider. Management plans discussed with the patient and he is in agreement  CODE STATUS: FULL  TOTAL TIME TAKING CARE OF THIS PATIENT: 44 minutes.    Sunny Aguon M.D on 02/26/2018 at 4:38 PM  Between 7am to 6pm - Pager - [phone]  After 6pm  go to [email or web address] - password [email or web address] [facility]  Office  [phone]  CC: Primary care physician; Janyth Pupa, NP

## 2018-02-26 NOTE — ED Notes (Signed)
Pt scrotum elevated and ice pack applied

## 2018-02-26 NOTE — ED Notes (Signed)
Patient transported to Ultrasound 

## 2018-02-27 DIAGNOSIS — N433 Hydrocele, unspecified: Principal | ICD-10-CM

## 2018-02-27 LAB — CBC
HEMATOCRIT: 31.2 % — AB (ref 39.0–52.0)
HEMOGLOBIN: 9.2 g/dL — AB (ref 13.0–17.0)
MCH: 21 pg — ABNORMAL LOW (ref 26.0–34.0)
MCHC: 29.5 g/dL — ABNORMAL LOW (ref 30.0–36.0)
MCV: 71.2 fL — ABNORMAL LOW (ref 80.0–100.0)
Platelets: 215 10*3/uL (ref 150–400)
RBC: 4.38 MIL/uL (ref 4.22–5.81)
RDW: 19.2 % — ABNORMAL HIGH (ref 11.5–15.5)
WBC: 9.9 10*3/uL (ref 4.0–10.5)
nRBC: 0 % (ref 0.0–0.2)

## 2018-02-27 LAB — BASIC METABOLIC PANEL
ANION GAP: 4 — AB (ref 5–15)
BUN: 13 mg/dL (ref 8–23)
CHLORIDE: 105 mmol/L (ref 98–111)
CO2: 26 mmol/L (ref 22–32)
Calcium: 8.9 mg/dL (ref 8.9–10.3)
Creatinine, Ser: 1.03 mg/dL (ref 0.61–1.24)
GFR calc Af Amer: >60 mL/min (ref >60)
GFR calc non Af Amer: >60 mL/min (ref >60)
Glucose, Bld: 161 mg/dL — ABNORMAL HIGH (ref 70–99)
Potassium: 4 mmol/L (ref 3.5–5.1)
Sodium: 135 mmol/L (ref 135–145)

## 2018-02-27 LAB — GLUCOSE, CAPILLARY
Glucose-Capillary: 169 mg/dL — ABNORMAL HIGH (ref 70–99)
Glucose-Capillary: 140 mg/dL — ABNORMAL HIGH (ref 70–99)
Glucose-Capillary: 151 mg/dL — ABNORMAL HIGH (ref 70–99)
Glucose-Capillary: 166 mg/dL — ABNORMAL HIGH (ref 70–99)

## 2018-02-27 MED ORDER — NEPRO/CARBSTEADY PO LIQD
237.0000 mL | Freq: Two times a day (BID) | ORAL | Status: DC
  Administered 2018-02-27: 237 mL via ORAL

## 2018-02-27 MED ORDER — WARFARIN - PHARMACIST DOSING INPATIENT
Freq: Every day | Status: DC
  Administered 2018-02-27: 18:00:00

## 2018-02-27 MED ORDER — HYDROCODONE-ACETAMINOPHEN 5-325 MG PO TABS
1.0000 | ORAL_TABLET | ORAL | Status: DC | PRN
  Administered 2018-02-27 – 2018-02-28 (×5): 2 via ORAL
  Filled 2018-02-27 (×5): qty 2

## 2018-02-27 MED ORDER — WARFARIN SODIUM 6 MG PO TABS
6.0000 mg | ORAL_TABLET | Freq: Once | ORAL | Status: AC
  Administered 2018-02-27: 6 mg via ORAL
  Filled 2018-02-27: qty 1

## 2018-02-27 MED ORDER — ASPIRIN EC 81 MG PO TBEC
162.0000 mg | DELAYED_RELEASE_TABLET | Freq: Every day | ORAL | Status: DC
  Administered 2018-02-27 – 2018-02-28 (×2): 162 mg via ORAL
  Filled 2018-02-27: qty 2

## 2018-02-27 NOTE — ACP (Advance Care Planning) (Signed)
Pt declined the AD completion while in the hospital. Pt shared he did not want to make a decision while in pain. Ch understood but still educated pt on options. Pt shared that he will talked to daughter about it. AD provided to pt to complete at his leisure.

## 2018-02-27 NOTE — Plan of Care (Signed)
  Problem: Education: Goal: Knowledge of General Education information will improve Description Including pain rating scale, medication(s)/side effects and non-pharmacologic comfort measures Outcome: Progressing   Problem: Clinical Measurements: Goal: Ability to maintain clinical measurements within normal limits will improve Outcome: Progressing Goal: Will remain free from infection Outcome: Progressing Goal: Diagnostic test results will improve Outcome: Progressing Goal: Respiratory complications will improve Outcome: Progressing Goal: Cardiovascular complication will be avoided Outcome: Progressing   Problem: Elimination: Goal: Will not experience complications related to bowel motility Outcome: Progressing Goal: Will not experience complications related to urinary retention Outcome: Progressing   Problem: Nutrition: Goal: Adequate nutrition will be maintained Outcome: Progressing  Patient added nutritional supplement

## 2018-02-27 NOTE — Consult Note (Signed)
ANTICOAGULATION CONSULT NOTE  Pharmacy Consult for warfarin dose management Indication: VTE prophylaxis  Patient Measurements: Height: 5\' 10"  (177.8 cm) Weight: 276 lb 10.8 oz (125.5 kg) IBW/kg (Calculated) : 73  Vital Signs: Temp: 98.4 F (36.9 C) (02/25 1124) Temp Source: Oral (02/25 1124) BP: 126/84 (02/25 1124) Pulse Rate: 69 (02/25 1124)  Labs: Recent Labs    02/24/18 1753 02/26/18 1030 02/26/18 1258 02/27/18 0428  HGB  --  9.5*  --  9.2*  HCT  --  32.8*  --  31.2*  PLT  --  244  --  215  LABPROT 24.5*  --  24.8*  --   INR 2.24  --  2.3  --   CREATININE  --  1.32*  --  1.03    Estimated Creatinine Clearance: 98.9 mL/min (by C-G formula based on SCr of 1.03 mg/dL).   Medical History: Past Medical History:  Diagnosis Date  . Arthritis   . Ascending aortic dissection ([place])   . Diabetes mellitus without complication ([place])   . 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 Hyperlipemia   . Hypertension   . Kidney stones     Medications:  Scheduled:  . allopurinol  100 mg Oral Q1500  . aspirin EC  162 mg Oral Daily  . feeding supplement (NEPRO CARB STEADY)  237 mL Oral BID BM  . insulin aspart  0-5 Units Subcutaneous QHS  . insulin aspart  0-9 Units Subcutaneous TID WC  . levofloxacin  500 mg Oral Daily  . pneumococcal 23 valent vaccine  0.5 mL Intramuscular Tomorrow-1000  . warfarin  6 mg Oral ONCE-1800  . Warfarin - Pharmacist Dosing Inpatient   Does not apply q1800    Assessment: 63 y.o. male with a known history of DVT on Coumadin who presents to the ED due to a hydrocele. He was admitted with a therapeutic INR on his home dose of 6 mg daily. His last dose of warfarin was 2/23. New DDIs include levofloxacin and Bactrim, which he received only a single dose of and is no longer taking. Hgb low at baseline but stable; will follow.  Goal of Therapy:  INR 2-3 Monitor platelets by anticoagulation protocol: Yes   Plan: INR remains  therapeutic despite missed dose. We will restart at 6 mg today and allow INRs to guide therapy beginning tomorrow.  Dallie Piles, PharmD 02/27/2018,1:33 PM

## 2018-02-27 NOTE — Progress Notes (Signed)
Inpatient Diabetes Program Recommendations  AACE/ADA: New Consensus Statement on Inpatient Glycemic Control (2015)  Target Ranges:  Prepandial:   less than 140 mg/dL      Peak postprandial:   less than 180 mg/dL (1-2 hours)      Critically ill patients:  140 - 180 mg/dL   Lab Results  Component Value Date   GLUCAP 169 (H) 02/27/2018   HGBA1C 8.0 (H) 02/26/2018    Review of Glycemic Control Results for HUNT, ZAJICEK (MRN [id number]) as of 02/27/2018 09:38  Ref. Range 02/26/2018 21:39 02/27/2018 07:28  Glucose-Capillary Latest Ref Range: 70 - 99 mg/dL 154 (H) 169 (H)   Diabetes history: DM 2 Outpatient Diabetes medications:  Glucotrol 10 mg bid Current orders for Inpatient glycemic control:  Novolog sensitive tid with meals and HS Inpatient Diabetes Program Recommendations:   Referral received.  Blood sugars currently within hospital goals.  A1C is > goal however improved from August 2019.  Agree with current orders.  Will follow.   Thanks,  Adah Perl, RN, BC-ADM Inpatient Diabetes Coordinator Pager [phone] (8a-5p)

## 2018-02-27 NOTE — Progress Notes (Signed)
Initial Nutrition Assessment  DOCUMENTATION CODES:   Obesity unspecified  INTERVENTION:   Nepro Shake po BID, each supplement provides 425 kcal and 19 grams protein  NUTRITION DIAGNOSIS:   Inadequate oral intake related to acute illness as evidenced by per patient/family report.  GOAL:   Patient will meet greater than or equal to 90% of their needs  MONITOR:   PO intake, Supplement acceptance, Labs, Weight trends, Skin, I & O's  REASON FOR ASSESSMENT:   Malnutrition Screening Tool    ASSESSMENT:   63 y.o. male with a history of diabetes DVT hypertension admitted with bilateral hydrocele   Met with pt in room today. Pt reports decreased appetite and oral intake for several days pta r/t pain. Pt reports that he is afraid to eat because, that will cause him to have to go to the bathroom which is painful. Pt reports that he is willing to drink a protein supplement; pt would like butter pecan Nepro. Pt reports his UBW is ~275lbs; pt reports his weight is stable.   Of note, pt with microcytic anemia. Pt reports passing a kidney stone yesterday with some blood loss. Would recommend checking iron/anemia labs to r/o deficiency.   Medications reviewed and include: allopurinol, aspirin, insulin  Labs reviewed: Hgb 9.2(L), Hct 31.2(L), MCV 71.2(L), MCH 21.0(L), MCHC 29.5(L) cbgs- 255, 161 x 24 hrs AIC 8.0(H)- 2/24  NUTRITION - FOCUSED PHYSICAL EXAM:    Most Recent Value  Orbital Region  No depletion  Upper Arm Region  No depletion  Thoracic and Lumbar Region  No depletion  Buccal Region  No depletion  Temple Region  No depletion  Clavicle Bone Region  No depletion  Clavicle and Acromion Bone Region  No depletion  Scapular Bone Region  No depletion  Dorsal Hand  No depletion  Patellar Region  No depletion  Anterior Thigh Region  No depletion  Posterior Calf Region  No depletion  Edema (RD Assessment)  None  Hair  Reviewed  Eyes  Reviewed  Mouth  Reviewed  Skin  Reviewed   Nails  Reviewed     Diet Order:   Diet Order            Diet heart healthy/carb modified Room service appropriate? Yes; Fluid consistency: Thin  Diet effective now             EDUCATION NEEDS:   Education needs have been addressed  Skin:  Skin Assessment: Reviewed RN Assessment  Last BM:  2/22  Height:   Ht Readings from Last 1 Encounters:  02/26/18 5' 10"  (1.778 m)    Weight:   Wt Readings from Last 1 Encounters:  02/27/18 125.5 kg    Ideal Body Weight:  75.4 kg  BMI:  Body mass index is 39.7 kg/m.  Estimated Nutritional Needs:   Kcal:  2300-2600kcal/day   Protein:  >125g/day   Fluid:  2.2L/day   Koleen Distance MS, RD, LDN Pager #- [phone] Office#- [phone] After Hours Pager: [phone]

## 2018-02-27 NOTE — Progress Notes (Signed)
Ch provided education to pt about AD. Pt declined moving forward with completing it while the hospital. Pt shared he did not want to make a decision while in pain. Pt provided a life review and ch provided a compassionate presence. Ch prayed w/ pt for him take have a change of perspective about what it means to help others and to take the time to recover so he can improve physically. Pt seemed spiritually sound yet may benefit from social interaction that is more recreational rather than work/service related. Pt is open to future visits.

## 2018-02-27 NOTE — Progress Notes (Signed)
[facility] at [facility]                                                                                                                                                                                  Patient Demographics   Shane Bean, is a 63 y.o. male, DOB - [birth year], KYH:[id number]  Admit date - 02/26/2018   Admitting Physician Bettey Costa, MD  Outpatient Primary MD for the patient is Janyth Pupa, NP   LOS - 1  Subjective:  Patient complains of pain in both of the scrotal areas and swelling   Review of Systems:   CONSTITUTIONAL: No documented fever. No fatigue, weakness. No weight gain, no weight loss.  EYES: No blurry or double vision.  ENT: No tinnitus. No postnasal drip. No redness of the oropharynx.  RESPIRATORY: No cough, no wheeze, no hemoptysis. No dyspnea.  CARDIOVASCULAR: No chest pain. No orthopnea. No palpitations. No syncope.  GASTROINTESTINAL: No nausea, no vomiting or diarrhea. No abdominal pain. No melena or hematochezia.  GENITOURINARY: No dysuria positive hematuria positive scrotal swelling ENDOCRINE: No polyuria or nocturia. No heat or cold intolerance.  HEMATOLOGY: No anemia. No bruising. No bleeding.  INTEGUMENTARY: No rashes. No lesions.  MUSCULOSKELETAL: No arthritis. No swelling. No gout.  NEUROLOGIC: No numbness, tingling, or ataxia. No seizure-type activity.  PSYCHIATRIC: No anxiety. No insomnia. No ADD.    Vitals:   Vitals:   02/26/18 2047 02/27/18 0500 02/27/18 0600 02/27/18 1124  BP: 121/79  118/83 126/84  Pulse: 72  72 69  Resp: 20  20   Temp: 99 F (37.2 C)  98.8 F (37.1 C) 98.4 F (36.9 C)  TempSrc: Oral  Oral Oral  SpO2: 97%  97% 97%  Weight: 123.4 kg 125.5 kg    Height: 5\' 10"  (1.778 m)       Wt Readings from Last 3 Encounters:  02/27/18 125.5 kg  02/24/18 127 kg  02/22/18 122.5 kg     Intake/Output Summary (Last 24 hours) at 02/27/2018 1247 Last data filed at 02/27/2018  1002 Gross per 24 hour  Intake 2296.53 ml  Output 275 ml  Net 2021.53 ml    Physical Exam:   GENERAL: Pleasant-appearing in no apparent distress.  HEAD, EYES, EARS, NOSE AND THROAT: Atraumatic, normocephalic. Extraocular muscles are intact. Pupils equal and reactive to light. Sclerae anicteric. No conjunctival injection. No oro-pharyngeal erythema.  NECK: Supple. There is no jugular venous distention. No bruits, no lymphadenopathy, no thyromegaly.  HEART: Regular rate and rhythm,. No murmurs, no rubs, no clicks.  LUNGS: Clear to auscultation bilaterally. No rales or rhonchi. No wheezes.  ABDOMEN: Soft, flat, nontender, nondistended. Has good  bowel sounds. No hepatosplenomegaly appreciated.  EXTREMITIES: No evidence of any cyanosis, clubbing, or peripheral edema.  +2 pedal and radial pulses bilaterally.  NEUROLOGIC: The patient is alert, awake, and oriented x3 with no focal motor or sensory deficits appreciated bilaterally.  SKIN: Moist and warm with no rashes appreciated.  Psych: Not anxious, depressed LN: No inguinal LN enlargement GU: Scrotal swelling with some tenderness   Antibiotics   Anti-infectives (From admission, onward)   Start     Dose/Rate Route Frequency Ordered Stop   02/27/18 1800  levofloxacin (LEVAQUIN) tablet 500 mg     500 mg Oral Daily 02/26/18 2034     02/26/18 2200  sulfamethoxazole-trimethoprim (BACTRIM DS,SEPTRA DS) 800-160 MG per tablet 1 tablet  Status:  Discontinued     1 tablet Oral Every 12 hours 02/26/18 1638 02/27/18 1155   02/26/18 1545  levofloxacin (LEVAQUIN) IVPB 750 mg     750 mg 100 mL/hr over 90 Minutes Intravenous  Once 02/26/18 1534 02/26/18 1847      Medications   Scheduled Meds: . allopurinol  100 mg Oral Q1500  . aspirin EC  162 mg Oral Daily  . feeding supplement (NEPRO CARB STEADY)  237 mL Oral BID BM  . insulin aspart  0-5 Units Subcutaneous QHS  . insulin aspart  0-9 Units Subcutaneous TID WC  . levofloxacin  500 mg Oral Daily   . pneumococcal 23 valent vaccine  0.5 mL Intramuscular Tomorrow-1000   Continuous Infusions: PRN Meds:.acetaminophen **OR** acetaminophen, HYDROcodone-acetaminophen, HYDROmorphone (DILAUDID) injection, ondansetron **OR** ondansetron (ZOFRAN) IV, polyethylene glycol   Data Review:   Micro Results Recent Results (from the past 240 hour(s))  Urine culture     Status: Abnormal   Collection Time: 02/22/18  5:19 PM  Result Value Ref Range Status   Specimen Description   Final    URINE, RANDOM Performed at [facility], [address] [place], [place] [address]    Special Requests   Final    NONE Performed at [facility], [address] [place], [place] [address]    Culture >=100,000 COLONIES/mL STAPHYLOCOCCUS AUREUS (A)  Final   Report Status 02/25/2018 FINAL  Final   Organism ID, Bacteria STAPHYLOCOCCUS AUREUS (A)  Final      Susceptibility   Staphylococcus aureus - MIC*    CIPROFLOXACIN <=0.5 SENSITIVE Sensitive     GENTAMICIN <=0.5 SENSITIVE Sensitive     NITROFURANTOIN <=16 SENSITIVE Sensitive     OXACILLIN <=0.25 SENSITIVE Sensitive     TETRACYCLINE <=1 SENSITIVE Sensitive     VANCOMYCIN 1 SENSITIVE Sensitive     TRIMETH/SULFA <=10 SENSITIVE Sensitive     CLINDAMYCIN RESISTANT Resistant     RIFAMPIN <=0.5 SENSITIVE Sensitive     Inducible Clindamycin POSITIVE Resistant     * >=100,000 COLONIES/mL STAPHYLOCOCCUS AUREUS    Radiology Reports Ct Head Wo Contrast  Result Date: 02/14/2018 CLINICAL DATA:  Pain and stiffness and neck from arthritis. Head trauma. Intracranial venous injury suspected. EXAM: CT HEAD WITHOUT CONTRAST CT CERVICAL SPINE WITHOUT CONTRAST TECHNIQUE: Multidetector CT imaging of the head and cervical spine was performed following the standard protocol without intravenous contrast. Multiplanar CT image reconstructions of the cervical spine were also generated. COMPARISON:  Neck CT 06/05/2014 FINDINGS: CT HEAD FINDINGS Brain: Age related  involutional changes of the brain with likely chronic minimal small vessel ischemic disease of periventricular and subcortical white matter. No acute intracranial hemorrhage, large vascular territory infarction, intra-axial mass nor extra-axial fluid collections. No hydrocephalus. Midline  fourth ventricle and basal cisterns. Brainstem and cerebellum are nonacute. Vascular: Atherosclerosis of the carotid siphons bilaterally. No hyperdense vessel sign. Skull: Intact Sinuses/Orbits: Nonacute Other: None CT CERVICAL SPINE FINDINGS Alignment: Straightening of cervical lordosis. Skull base and vertebrae: Intact craniocervical relationship and the Lantus dental interval. Skull base is intact. No acute cervical spine fracture or suspicious osseous lesions. Soft tissues and spinal canal: No prevertebral fluid or swelling. No visible canal hematoma. Disc levels: C5-6 and C7-T1 degenerative disc disease. Uncovertebral joint osteoarthritis with mild uncinate spurring is noted on the right at C2-3, bilaterally at C3-4 and C5-6. Associated right-sided C2-3, left C3-4 and right C4-5 facet arthrosis. Upper chest: Negative Other: None IMPRESSION: 1. Age related involutional changes of the brain without acute intracranial abnormality. 2. Cervical spondylosis without acute cervical spine fracture or posttraumatic listhesis. Electronically Signed   By: Ashley Royalty M.D.   On: 02/14/2018 20:04   Ct Cervical Spine Wo Contrast  Result Date: 02/14/2018 CLINICAL DATA:  Pain and stiffness and neck from arthritis. Head trauma. Intracranial venous injury suspected. EXAM: CT HEAD WITHOUT CONTRAST CT CERVICAL SPINE WITHOUT CONTRAST TECHNIQUE: Multidetector CT imaging of the head and cervical spine was performed following the standard protocol without intravenous contrast. Multiplanar CT image reconstructions of the cervical spine were also generated. COMPARISON:  Neck CT 06/05/2014 FINDINGS: CT HEAD FINDINGS Brain: Age related involutional  changes of the brain with likely chronic minimal small vessel ischemic disease of periventricular and subcortical white matter. No acute intracranial hemorrhage, large vascular territory infarction, intra-axial mass nor extra-axial fluid collections. No hydrocephalus. Midline fourth ventricle and basal cisterns. Brainstem and cerebellum are nonacute. Vascular: Atherosclerosis of the carotid siphons bilaterally. No hyperdense vessel sign. Skull: Intact Sinuses/Orbits: Nonacute Other: None CT CERVICAL SPINE FINDINGS Alignment: Straightening of cervical lordosis. Skull base and vertebrae: Intact craniocervical relationship and the Lantus dental interval. Skull base is intact. No acute cervical spine fracture or suspicious osseous lesions. Soft tissues and spinal canal: No prevertebral fluid or swelling. No visible canal hematoma. Disc levels: C5-6 and C7-T1 degenerative disc disease. Uncovertebral joint osteoarthritis with mild uncinate spurring is noted on the right at C2-3, bilaterally at C3-4 and C5-6. Associated right-sided C2-3, left C3-4 and right C4-5 facet arthrosis. Upper chest: Negative Other: None IMPRESSION: 1. Age related involutional changes of the brain without acute intracranial abnormality. 2. Cervical spondylosis without acute cervical spine fracture or posttraumatic listhesis. Electronically Signed   By: Ashley Royalty M.D.   On: 02/14/2018 20:04   Ct Pelvis W Contrast  Result Date: 02/24/2018 CLINICAL DATA:  Left testicle swelling. EXAM: CT PELVIS WITH CONTRAST TECHNIQUE: Multidetector CT imaging of the pelvis was performed using the standard protocol following the bolus administration of intravenous contrast. CONTRAST:  186mL OMNIPAQUE IOHEXOL 300 MG/ML  SOLN COMPARISON:  02/22/2018 FINDINGS: Urinary Tract: Visualized distal ureters are decompressed as is the urinary bladder, grossly unremarkable. Bowel: Diverticulosis in the visualized sigmoid colon and cecum. Visualized small bowel decompressed.  Appendix is normal. Vascular/Lymphatic: Atherosclerotic change.  No aneurysm. Reproductive: Mild prominence of the prostate. Bilateral hydroceles noted, left greater than right. This is similar to prior study. Other:  No free fluid or free air. Musculoskeletal: No acute bony abnormality. IMPRESSION: Bilateral hydroceles, left larger than right. This is similar to recent study. Diverticulosis in the visualized sigmoid colon and cecum. Electronically Signed   By: Rolm Baptise M.D.   On: 02/24/2018 18:10   Ct Renal Stone Study  Result Date: 02/22/2018 CLINICAL DATA:  Pain radiating to  RIGHT groin for 3 days. LEFT testicle swelling. History of kidney stones, diabetes and aortic dissection. EXAM: CT ABDOMEN AND PELVIS WITHOUT CONTRAST TECHNIQUE: Multidetector CT imaging of the abdomen and pelvis was performed following the standard protocol without IV contrast. COMPARISON:  CT abdomen and pelvis September 20, 2014 FINDINGS: LOWER CHEST: Lung bases are clear. The visualized heart size is normal. No pericardial effusion. HEPATOBILIARY: Mild suspected hepatomegaly limited by habitus. PANCREAS: Normal. SPLEEN: Normal. ADRENALS/URINARY TRACT: Kidneys are orthotopic, Atrophic RIGHT kidney, compensatory LEFT nephromegaly. 3 LEFT lower pole stones measuring to 10 mm. 7 mm RIGHT lower pole nephrolithiasis. No hydronephrosis; limited assessment for renal masses by nonenhanced CT. Similar exophytic low-density probable cyst upper pole RIGHT kidney measuring to 13 mm. Additional too small to characterize hypodensities bilateral kidneys. The unopacified ureters are normal in course and caliber. Urinary bladder is partially distended and unremarkable. 1 cm benign RIGHT adrenal adenoma. STOMACH/BOWEL: The stomach, small and large bowel are normal in course and caliber without inflammatory changes, sensitivity decreased by lack of enteric contrast. Moderate colonic diverticulosis. Status post RIGHT hemicolectomy. VASCULAR/LYMPHATIC:  Aortoiliac vessels are normal in caliber. Mild calcific atherosclerosis with central luminal calcifications most compatible with chronic dissection. No lymphadenopathy by CT size criteria. REPRODUCTIVE: Normal prostate size. Hydroceles incompletely evaluated. OTHER: No intraperitoneal free fluid or free air. MUSCULOSKELETAL: Non-acute. Small LEFT fat containing inguinal hernia. Anterior abdominal wall ligamentous laxity. LEFT bridging sacroiliac osteophyte. IMPRESSION: 1. Bilateral nonobstructing nephrolithiasis. Similarly Atrophic RIGHT kidney. 2. Bilateral hydroceles. 3. Colonic diverticulosis without acute diverticulitis nor acute intra-abdominal/pelvic process. Aortic Atherosclerosis (ICD10-I70.0). Electronically Signed   By: Elon Alas M.D.   On: 02/22/2018 22:29   US Scrotum W/doppler  Result Date: 02/26/2018 CLINICAL DATA:  Scrotal pain and swelling. EXAM: SCROTAL ULTRASOUND DOPPLER ULTRASOUND OF THE TESTICLES TECHNIQUE: Complete ultrasound examination of the testicles, epididymis, and other scrotal structures was performed. Color and spectral Doppler ultrasound were also utilized to evaluate blood flow to the testicles. COMPARISON:  None. FINDINGS: Right testicle Measurements: 5.7 x 3.3 x 3.3 cm. No mass or microlithiasis visualized. Left testicle Measurements: 4.5 x 3.5 x 3.4 cm. No mass or microlithiasis visualized. Right epididymis: Multiple epididymal cysts are noted with the largest measuring 7 mm. Left epididymis: Multiple epididymal cysts are noted with the largest measuring 7 mm. Hydrocele: Bilateral hydroceles are noted, with left greater than right. Varicocele:  None visualized. Pulsed Doppler interrogation of both testes demonstrates normal low resistance arterial and venous waveforms bilaterally. IMPRESSION: No evidence of testicular mass or torsion. Multiple bilateral epididymal cysts are noted. Bilateral hydroceles are noted, left greater than right. Electronically Signed   By: Marijo Conception, M.D.   On: 02/26/2018 13:57   US Scrotum W/doppler  Result Date: 02/22/2018 CLINICAL DATA:  LEFT scrotal pain and swelling for 3 days. EXAM: SCROTAL ULTRASOUND DOPPLER ULTRASOUND OF THE TESTICLES TECHNIQUE: Complete ultrasound examination of the testicles, epididymis, and other scrotal structures was performed. Color and spectral Doppler ultrasound were also utilized to evaluate blood flow to the testicles. COMPARISON:  None. FINDINGS: Right testicle Measurements: 5.6 x 3.5 x 3 cm. Homogeneous echogenicity. No mass. Scattered microlithiasis. Left testicle Measurements: 4.7 x 3.3 x 3.2 cm. Homogeneous echogenicity. No mass. Scattered microlithiasis. Right epididymis: Normal in size. Multiple anechoic cysts measuring 8 mm. Left epididymis: Normal in size. Multiple anechoic cysts measuring to 9 mm. Hydrocele:  Moderate LEFT and mild RIGHT hydroceles. Varicocele:  None visualized. Pulsed Doppler interrogation of both testes demonstrates normal low resistance arterial and venous  waveforms bilaterally. IMPRESSION: 1. Complex moderate LEFT and mild RIGHT hydroceles. 2. Bilateral testicular microlithiasis. Current literature suggests that testicular microlithiasis is not a significant independent risk factor for development of testicular carcinoma, and that follow up imaging is not warranted in the absence of other risk factors. Monthly testicular self-examination and annual physical exams are considered appropriate surveillance. If patient has other risk factors for testicular carcinoma, then referral to Urology should be considered. (Reference: DeCastro, et al.: A 5-Year Follow up Study of Asymptomatic Men with Testicular Microlithiasis. J Urol 2008; [id number].) Electronically Signed   By: Elon Alas M.D.   On: 02/22/2018 20:56     CBC Recent Labs  Lab 02/24/18 1209 02/26/18 1030 02/27/18 0428  WBC 11.1* 10.9* 9.9  HGB 9.5* 9.5* 9.2*  HCT 32.0* 32.8* 31.2*  PLT 227 244 215  MCV 70.5*  71.9* 71.2*  MCH 20.9* 20.8* 21.0*  MCHC 29.7* 29.0* 29.5*  RDW 19.6* 19.6* 19.2*  LYMPHSABS 1.3  --   --   MONOABS 0.9  --   --   EOSABS 0.1  --   --   BASOSABS 0.0  --   --     Chemistries  Recent Labs  Lab 02/24/18 1209 02/26/18 1030 02/27/18 0428  NA 133* 135 135  K 3.9 4.1 4.0  CL 100 101 105  CO2 25 24 26   GLUCOSE 229* 255* 161*  BUN 15 19 13   CREATININE 1.09 1.32* 1.03  CALCIUM 9.0 9.2 8.9  AST 16 19  --   ALT 13 14  --   ALKPHOS 38 41  --   BILITOT 0.9 0.7  --    ------------------------------------------------------------------------------------------------------------------ estimated creatinine clearance is 98.9 mL/min (by C-G formula based on SCr of 1.03 mg/dL). ------------------------------------------------------------------------------------------------------------------ Recent Labs    02/26/18 1030  HGBA1C 8.0*   ------------------------------------------------------------------------------------------------------------------ No results for input(s): CHOL, HDL, LDLCALC, TRIG, CHOLHDL, LDLDIRECT in the last 72 hours. ------------------------------------------------------------------------------------------------------------------ No results for input(s): TSH, T4TOTAL, T3FREE, THYROIDAB in the last 72 hours.  Invalid input(s): FREET3 ------------------------------------------------------------------------------------------------------------------ No results for input(s): VITAMINB12, FOLATE, FERRITIN, TIBC, IRON, RETICCTPCT in the last 72 hours.  Coagulation profile Recent Labs  Lab 02/24/18 1753 02/26/18 1258  INR 2.24 2.3    No results for input(s): DDIMER in the last 72 hours.  Cardiac Enzymes No results for input(s): CKMB, TROPONINI, MYOGLOBIN in the last 168 hours.  Invalid input(s): CK ------------------------------------------------------------------------------------------------------------------ Invalid input(s):  POCBNP    Assessment & Plan   63 year old male with history of DVT on Coumadin who presents for the third time to the emergency room due to groin pain and diagnosed with bilateral hydrocele.  1.  Bilateral hydrocele: No plan for surgery per urology Pain control Elevate scrotum We will discontinue Bactrim just to Levaquin in light of acute kidney injury   2.  Acute kidney injury in the setting of poor p.o. intake due to pain: This has resolved with IV fluids  3.  History of DVT on Coumadin: Coumadin to be resumed   4.  Diabetes: Continue sliding scale insulin  5.  Essential hypertension: Continue Norvasc, Cardura, metoprolol Hold Aldactone for now  6.  Hyperlipidemia: Continue statin      Code Status Orders  (From admission, onward)         Start     Ordered   02/26/18 2035  Full code  Continuous     02/26/18 2034        Code Status History    Date Active Date Inactive  Code Status Order ID Comments User Context   08/17/2017 1408 08/26/2017 1639 Full Code [id number]  Ileana Roup, MD Inpatient           Consults neurology  DVT Prophylaxis Coumadin Lab Results  Component Value Date   PLT 215 02/27/2018     Time Spent in minutes 35 minutes greater than 50% of time spent in care coordination and counseling patient regarding the condition and plan of care.   Dustin Flock M.D on 02/27/2018 at 12:47 PM  Between 7am to 6pm - Pager - [phone]  After 6pm go to [email or web address] - Proofreader  [employer]   Office  [phone]

## 2018-02-27 NOTE — Progress Notes (Signed)
   Subjective Afebrile overnight Moderately controlled bilateral scrotal pain, worse on the left  Physical Exam: BP 118/83 (BP Location: Right Arm)   Pulse 72   Temp 98.8 F (37.1 C) (Oral)   Resp 20   Ht 5\' 10"  (1.778 m)   Wt 125.5 kg   SpO2 97%   BMI 39.70 kg/m    Constitutional:  Alert and oriented, No acute distress. Respiratory: Normal respiratory effort, no increased work of breathing. GI: Abdomen is soft, non-tender, non-distended GU: No CVA tenderness, phallus without lesions, hydroceles bilaterally, scrotum tender to palpation bilaterally, worse on the left.  There is no erythema, induration, or drainage  Laboratory Data: Reviewed  Pertinent Imaging: Ultrasound and CT scan personally reviewed  Assessment & Plan:   Shane Bean is a 63 year old male with chronic pain who has had multiple ER visits over the last week for scrotal pain, worse on the left.  He has had a reassuring CT abdomen pelvis and 2 scrotal ultrasounds with no indication of obstructing ureteral stones or abscess.  He does have a culture documented staph aureus UTI from 2/20.  I suspect his pain is secondary to a UTI with orchitis.  He has numerous comorbidities including anticoagulation for history of DVT, and poorly controlled diabetes.  Continue culture appropriate antibiotics, recommend 14-day course total Continue snug fitting underwear and anti-inflammatories PRN We will arrange urology follow-up in 4 weeks for symptom check No plans for surgical intervention Call if questions  Billey Co, MD

## 2018-02-28 LAB — PROTIME-INR
INR: 2.3 — ABNORMAL HIGH (ref 0.8–1.2)
Prothrombin Time: 25.2 seconds — ABNORMAL HIGH (ref 11.4–15.2)

## 2018-02-28 LAB — CBC
HCT: 31.9 % — ABNORMAL LOW (ref 39.0–52.0)
Hemoglobin: 9.5 g/dL — ABNORMAL LOW (ref 13.0–17.0)
MCH: 20.8 pg — ABNORMAL LOW (ref 26.0–34.0)
MCHC: 29.8 g/dL — ABNORMAL LOW (ref 30.0–36.0)
MCV: 70 fL — AB (ref 80.0–100.0)
Platelets: 227 10*3/uL (ref 150–400)
RBC: 4.56 MIL/uL (ref 4.22–5.81)
RDW: 19 % — ABNORMAL HIGH (ref 11.5–15.5)
WBC: 10.3 10*3/uL (ref 4.0–10.5)
nRBC: 0 % (ref 0.0–0.2)

## 2018-02-28 LAB — BASIC METABOLIC PANEL
Anion gap: 8 (ref 5–15)
BUN: 9 mg/dL (ref 8–23)
CO2: 25 mmol/L (ref 22–32)
Calcium: 9.2 mg/dL (ref 8.9–10.3)
Chloride: 101 mmol/L (ref 98–111)
Creatinine, Ser: 0.95 mg/dL (ref 0.61–1.24)
GFR calc Af Amer: >60 mL/min (ref >60)
Glucose, Bld: 169 mg/dL — ABNORMAL HIGH (ref 70–99)
Potassium: 4 mmol/L (ref 3.5–5.1)
Sodium: 134 mmol/L — ABNORMAL LOW (ref 135–145)

## 2018-02-28 LAB — GLUCOSE, CAPILLARY
Glucose-Capillary: 167 mg/dL — ABNORMAL HIGH (ref 70–99)
Glucose-Capillary: 163 mg/dL — ABNORMAL HIGH (ref 70–99)

## 2018-02-28 LAB — HIV ANTIBODY (ROUTINE TESTING W REFLEX): HIV Screen 4th Generation wRfx: NONREACTIVE

## 2018-02-28 MED ORDER — WARFARIN SODIUM 4 MG PO TABS
4.0000 mg | ORAL_TABLET | Freq: Once | ORAL | Status: DC
  Filled 2018-02-28: qty 1

## 2018-02-28 MED ORDER — LEVOFLOXACIN 500 MG PO TABS: 500.0000 mg | ORAL_TABLET | Freq: Every day | ORAL | 0 refills | Status: AC

## 2018-02-28 MED ORDER — LEVOFLOXACIN 500 MG PO TABS
500.0000 mg | ORAL_TABLET | Freq: Every day | ORAL | Status: DC
  Administered 2018-02-28: 500 mg via ORAL
  Filled 2018-02-28: qty 1

## 2018-02-28 MED ORDER — HYDROCODONE-ACETAMINOPHEN 5-325 MG PO TABS: 1.0000 | ORAL_TABLET | ORAL | 0 refills | Status: DC | PRN

## 2018-02-28 NOTE — Progress Notes (Signed)
Discharge instructions reviewed with the patient and his brother.;  Patient will be sent out via wheelchair with belongings

## 2018-02-28 NOTE — Discharge Summary (Signed)
[place] at [facility], 63 y.o., DOB 04-15-1955, MRN [id number]. Admission date: 02/26/2018 Discharge Date 02/28/2018 Primary MD Janyth Pupa, NP Admitting Physician Bettey Costa, MD  Admission Diagnosis  Renal colic [M57] Epididymal cyst [N50.3] Swelling [R60.9] Acute renal insufficiency [N28.9] Bilateral hydrocele [N43.3]  Discharge Diagnosis   Active Problems: Bilateral hydrocele Orchitis UTI Acute kidney injury History of DVT Diabetes type 2 Essential hypertension Hyperlipidemia  Hospital Course  63 year old male with history of DVT on Coumadin who presents for the third time to the emergency room due to groin pain and diagnosed with bilateral hydrocele.  Patient was having significant pain therefore he was admitted to the hospital.  He was treated with antibiotics and pain control.  He was seen in consultation by urology who recommended conservative approach.  They stated that if patient symptoms worsen then potentially he would need surgery or drainage.  Patient is doing better and is stable for discharge.  With outpatient urology follow-up.             Consults  urology  Significant Tests:  See full reports for all details     Ct Head Wo Contrast  Result Date: 02/14/2018 CLINICAL DATA:  Pain and stiffness and neck from arthritis. Head trauma. Intracranial venous injury suspected. EXAM: CT HEAD WITHOUT CONTRAST CT CERVICAL SPINE WITHOUT CONTRAST TECHNIQUE: Multidetector CT imaging of the head and cervical spine was performed following the standard protocol without intravenous contrast. Multiplanar CT image reconstructions of the cervical spine were also generated. COMPARISON:  Neck CT 06/05/2014 FINDINGS: CT HEAD FINDINGS Brain: Age related involutional changes of the brain with likely chronic minimal small vessel ischemic disease of periventricular and subcortical white matter. No acute intracranial hemorrhage,  large vascular territory infarction, intra-axial mass nor extra-axial fluid collections. No hydrocephalus. Midline fourth ventricle and basal cisterns. Brainstem and cerebellum are nonacute. Vascular: Atherosclerosis of the carotid siphons bilaterally. No hyperdense vessel sign. Skull: Intact Sinuses/Orbits: Nonacute Other: None CT CERVICAL SPINE FINDINGS Alignment: Straightening of cervical lordosis. Skull base and vertebrae: Intact craniocervical relationship and the Lantus dental interval. Skull base is intact. No acute cervical spine fracture or suspicious osseous lesions. Soft tissues and spinal canal: No prevertebral fluid or swelling. No visible canal hematoma. Disc levels: C5-6 and C7-T1 degenerative disc disease. Uncovertebral joint osteoarthritis with mild uncinate spurring is noted on the right at C2-3, bilaterally at C3-4 and C5-6. Associated right-sided C2-3, left C3-4 and right C4-5 facet arthrosis. Upper chest: Negative Other: None IMPRESSION: 1. Age related involutional changes of the brain without acute intracranial abnormality. 2. Cervical spondylosis without acute cervical spine fracture or posttraumatic listhesis. Electronically Signed   By: Ashley Royalty M.D.   On: 02/14/2018 20:04   Ct Cervical Spine Wo Contrast  Result Date: 02/14/2018 CLINICAL DATA:  Pain and stiffness and neck from arthritis. Head trauma. Intracranial venous injury suspected. EXAM: CT HEAD WITHOUT CONTRAST CT CERVICAL SPINE WITHOUT CONTRAST TECHNIQUE: Multidetector CT imaging of the head and cervical spine was performed following the standard protocol without intravenous contrast. Multiplanar CT image reconstructions of the cervical spine were also generated. COMPARISON:  Neck CT 06/05/2014 FINDINGS: CT HEAD FINDINGS Brain: Age related involutional changes of the brain with likely chronic minimal small vessel ischemic disease of periventricular and subcortical white matter. No acute intracranial hemorrhage, large vascular  territory infarction, intra-axial mass nor extra-axial fluid collections. No hydrocephalus. Midline fourth ventricle and basal cisterns. Brainstem and cerebellum are nonacute. Vascular: Atherosclerosis of the  carotid siphons bilaterally. No hyperdense vessel sign. Skull: Intact Sinuses/Orbits: Nonacute Other: None CT CERVICAL SPINE FINDINGS Alignment: Straightening of cervical lordosis. Skull base and vertebrae: Intact craniocervical relationship and the Lantus dental interval. Skull base is intact. No acute cervical spine fracture or suspicious osseous lesions. Soft tissues and spinal canal: No prevertebral fluid or swelling. No visible canal hematoma. Disc levels: C5-6 and C7-T1 degenerative disc disease. Uncovertebral joint osteoarthritis with mild uncinate spurring is noted on the right at C2-3, bilaterally at C3-4 and C5-6. Associated right-sided C2-3, left C3-4 and right C4-5 facet arthrosis. Upper chest: Negative Other: None IMPRESSION: 1. Age related involutional changes of the brain without acute intracranial abnormality. 2. Cervical spondylosis without acute cervical spine fracture or posttraumatic listhesis. Electronically Signed   By: Ashley Royalty M.D.   On: 02/14/2018 20:04   Ct Pelvis W Contrast  Result Date: 02/24/2018 CLINICAL DATA:  Left testicle swelling. EXAM: CT PELVIS WITH CONTRAST TECHNIQUE: Multidetector CT imaging of the pelvis was performed using the standard protocol following the bolus administration of intravenous contrast. CONTRAST:  177mL OMNIPAQUE IOHEXOL 300 MG/ML  SOLN COMPARISON:  02/22/2018 FINDINGS: Urinary Tract: Visualized distal ureters are decompressed as is the urinary bladder, grossly unremarkable. Bowel: Diverticulosis in the visualized sigmoid colon and cecum. Visualized small bowel decompressed. Appendix is normal. Vascular/Lymphatic: Atherosclerotic change.  No aneurysm. Reproductive: Mild prominence of the prostate. Bilateral hydroceles noted, left greater than  right. This is similar to prior study. Other:  No free fluid or free air. Musculoskeletal: No acute bony abnormality. IMPRESSION: Bilateral hydroceles, left larger than right. This is similar to recent study. Diverticulosis in the visualized sigmoid colon and cecum. Electronically Signed   By: Rolm Baptise M.D.   On: 02/24/2018 18:10   Ct Renal Stone Study  Result Date: 02/22/2018 CLINICAL DATA:  Pain radiating to RIGHT groin for 3 days. LEFT testicle swelling. History of kidney stones, diabetes and aortic dissection. EXAM: CT ABDOMEN AND PELVIS WITHOUT CONTRAST TECHNIQUE: Multidetector CT imaging of the abdomen and pelvis was performed following the standard protocol without IV contrast. COMPARISON:  CT abdomen and pelvis September 20, 2014 FINDINGS: LOWER CHEST: Lung bases are clear. The visualized heart size is normal. No pericardial effusion. HEPATOBILIARY: Mild suspected hepatomegaly limited by habitus. PANCREAS: Normal. SPLEEN: Normal. ADRENALS/URINARY TRACT: Kidneys are orthotopic, Atrophic RIGHT kidney, compensatory LEFT nephromegaly. 3 LEFT lower pole stones measuring to 10 mm. 7 mm RIGHT lower pole nephrolithiasis. No hydronephrosis; limited assessment for renal masses by nonenhanced CT. Similar exophytic low-density probable cyst upper pole RIGHT kidney measuring to 13 mm. Additional too small to characterize hypodensities bilateral kidneys. The unopacified ureters are normal in course and caliber. Urinary bladder is partially distended and unremarkable. 1 cm benign RIGHT adrenal adenoma. STOMACH/BOWEL: The stomach, small and large bowel are normal in course and caliber without inflammatory changes, sensitivity decreased by lack of enteric contrast. Moderate colonic diverticulosis. Status post RIGHT hemicolectomy. VASCULAR/LYMPHATIC: Aortoiliac vessels are normal in caliber. Mild calcific atherosclerosis with central luminal calcifications most compatible with chronic dissection. No lymphadenopathy by  CT size criteria. REPRODUCTIVE: Normal prostate size. Hydroceles incompletely evaluated. OTHER: No intraperitoneal free fluid or free air. MUSCULOSKELETAL: Non-acute. Small LEFT fat containing inguinal hernia. Anterior abdominal wall ligamentous laxity. LEFT bridging sacroiliac osteophyte. IMPRESSION: 1. Bilateral nonobstructing nephrolithiasis. Similarly Atrophic RIGHT kidney. 2. Bilateral hydroceles. 3. Colonic diverticulosis without acute diverticulitis nor acute intra-abdominal/pelvic process. Aortic Atherosclerosis (ICD10-I70.0). Electronically Signed   By: Elon Alas M.D.   On: 02/22/2018 22:29  US Scrotum W/doppler  Result Date: 02/26/2018 CLINICAL DATA:  Scrotal pain and swelling. EXAM: SCROTAL ULTRASOUND DOPPLER ULTRASOUND OF THE TESTICLES TECHNIQUE: Complete ultrasound examination of the testicles, epididymis, and other scrotal structures was performed. Color and spectral Doppler ultrasound were also utilized to evaluate blood flow to the testicles. COMPARISON:  None. FINDINGS: Right testicle Measurements: 5.7 x 3.3 x 3.3 cm. No mass or microlithiasis visualized. Left testicle Measurements: 4.5 x 3.5 x 3.4 cm. No mass or microlithiasis visualized. Right epididymis: Multiple epididymal cysts are noted with the largest measuring 7 mm. Left epididymis: Multiple epididymal cysts are noted with the largest measuring 7 mm. Hydrocele: Bilateral hydroceles are noted, with left greater than right. Varicocele:  None visualized. Pulsed Doppler interrogation of both testes demonstrates normal low resistance arterial and venous waveforms bilaterally. IMPRESSION: No evidence of testicular mass or torsion. Multiple bilateral epididymal cysts are noted. Bilateral hydroceles are noted, left greater than right. Electronically Signed   By: Marijo Conception, M.D.   On: 02/26/2018 13:57   US Scrotum W/doppler  Result Date: 02/22/2018 CLINICAL DATA:  LEFT scrotal pain and swelling for 3 days. EXAM: SCROTAL  ULTRASOUND DOPPLER ULTRASOUND OF THE TESTICLES TECHNIQUE: Complete ultrasound examination of the testicles, epididymis, and other scrotal structures was performed. Color and spectral Doppler ultrasound were also utilized to evaluate blood flow to the testicles. COMPARISON:  None. FINDINGS: Right testicle Measurements: 5.6 x 3.5 x 3 cm. Homogeneous echogenicity. No mass. Scattered microlithiasis. Left testicle Measurements: 4.7 x 3.3 x 3.2 cm. Homogeneous echogenicity. No mass. Scattered microlithiasis. Right epididymis: Normal in size. Multiple anechoic cysts measuring 8 mm. Left epididymis: Normal in size. Multiple anechoic cysts measuring to 9 mm. Hydrocele:  Moderate LEFT and mild RIGHT hydroceles. Varicocele:  None visualized. Pulsed Doppler interrogation of both testes demonstrates normal low resistance arterial and venous waveforms bilaterally. IMPRESSION: 1. Complex moderate LEFT and mild RIGHT hydroceles. 2. Bilateral testicular microlithiasis. Current literature suggests that testicular microlithiasis is not a significant independent risk factor for development of testicular carcinoma, and that follow up imaging is not warranted in the absence of other risk factors. Monthly testicular self-examination and annual physical exams are considered appropriate surveillance. If patient has other risk factors for testicular carcinoma, then referral to Urology should be considered. (Reference: DeCastro, et al.: A 5-Year Follow up Study of Asymptomatic Men with Testicular Microlithiasis. J Urol 2008; 737:1062-6948.) Electronically Signed   By: Elon Alas M.D.   On: 02/22/2018 20:56       Today   Subjective:   Shane Bean patient doing better scrotal swelling less  Objective:   Blood pressure 135/86, pulse 78, temperature 98.8 F (37.1 C), temperature source Oral, resp. rate 20, height 5\' 10"  (1.778 m), weight 125.5 kg, SpO2 97 %.  .  Intake/Output Summary (Last 24 hours) at 02/28/2018  1502 Last data filed at 02/28/2018 1300 Gross per 24 hour  Intake 240 ml  Output 350 ml  Net -110 ml    Exam VITAL SIGNS: Blood pressure 135/86, pulse 78, temperature 98.8 F (37.1 C), temperature source Oral, resp. rate 20, height 5\' 10"  (1.778 m), weight 125.5 kg, SpO2 97 %.  GENERAL:  63 y.o.-year-old patient lying in the bed with no acute distress.  EYES: Pupils equal, round, reactive to light and accommodation. No scleral icterus. Extraocular muscles intact.  HEENT: Head atraumatic, normocephalic. Oropharynx and nasopharynx clear.  NECK:  Supple, no jugular venous distention. No thyroid enlargement, no tenderness.  LUNGS: Normal breath sounds bilaterally, no  wheezing, rales,rhonchi or crepitation. No use of accessory muscles of respiration.  CARDIOVASCULAR: S1, S2 normal. No murmurs, rubs, or gallops.  ABDOMEN: Soft, nontender, nondistended. Bowel sounds present. No organomegaly or mass.  EXTREMITIES: No pedal edema, cyanosis, or clubbing.  NEUROLOGIC: Cranial nerves II through XII are intact. Muscle strength 5/5 in all extremities. Sensation intact. Gait not checked.  PSYCHIATRIC: The patient is alert and oriented x 3.  SKIN: No obvious rash, lesion, or ulcer.   Data Review     CBC w Diff:  Lab Results  Component Value Date   WBC 10.3 02/28/2018   HGB 9.5 (L) 02/28/2018   HCT 31.9 (L) 02/28/2018   PLT 227 02/28/2018   LYMPHOPCT 11 02/24/2018   MONOPCT 8 02/24/2018   EOSPCT 1 02/24/2018   BASOPCT 0 02/24/2018   CMP:  Lab Results  Component Value Date   NA 134 (L) 02/28/2018   K 4.0 02/28/2018   CL 101 02/28/2018   CO2 25 02/28/2018   BUN 9 02/28/2018   CREATININE 0.95 02/28/2018   PROT 7.4 02/26/2018   ALBUMIN 3.5 02/26/2018   BILITOT 0.7 02/26/2018   ALKPHOS 41 02/26/2018   AST 19 02/26/2018   ALT 14 02/26/2018  .  Micro Results Recent Results (from the past 240 hour(s))  Urine culture     Status: Abnormal   Collection Time: 02/22/18  5:19 PM  Result  Value Ref Range Status   Specimen Description   Final    URINE, RANDOM Performed at [facility], [address] [place], [place] [address]    Special Requests   Final    NONE Performed at [facility], [address] [place], [place] [address]    Culture >=100,000 COLONIES/mL STAPHYLOCOCCUS AUREUS (A)  Final   Report Status 02/25/2018 FINAL  Final   Organism ID, Bacteria STAPHYLOCOCCUS AUREUS (A)  Final      Susceptibility   Staphylococcus aureus - MIC*    CIPROFLOXACIN <=0.5 SENSITIVE Sensitive     GENTAMICIN <=0.5 SENSITIVE Sensitive     NITROFURANTOIN <=16 SENSITIVE Sensitive     OXACILLIN <=0.25 SENSITIVE Sensitive     TETRACYCLINE <=1 SENSITIVE Sensitive     VANCOMYCIN 1 SENSITIVE Sensitive     TRIMETH/SULFA <=10 SENSITIVE Sensitive     CLINDAMYCIN RESISTANT Resistant     RIFAMPIN <=0.5 SENSITIVE Sensitive     Inducible Clindamycin POSITIVE Resistant     * >=100,000 COLONIES/mL STAPHYLOCOCCUS AUREUS        Code Status Orders  (From admission, onward)         Start     Ordered   02/26/18 2035  Full code  Continuous     02/26/18 2034        Code Status History    Date Active Date Inactive Code Status Order ID Comments User Context   08/17/2017 1408 08/26/2017 1639 Full Code [id number]  Ileana Roup, MD Inpatient          Follow-up Information    Janyth Pupa, NP. Schedule an appointment as soon as possible for a visit in 6 days.   Specialty:  Internal Medicine Why:  Please have patient to call to schedule an appointment for a hospital follow up Contact information: [place] [address] [phone]        Billey Co, MD. Go on 04/04/2018.   Specialty:  Urology Why:  @9 :45am Contact information: [place]  [place] [address] [phone]           Discharge Medications   Allergies as of 02/28/2018      Reactions   Chlorthalidone Swelling    Testicular/Scrotum edema   Hydralazine Other (See Comments)   GI Upset (intolerance)   Lisinopril Swelling   Facial Edema (intolerance)      Medication List    STOP taking these medications   oxyCODONE-acetaminophen 5-325 MG tablet Commonly known as:  PERCOCET   predniSONE 50 MG tablet Commonly known as:  DELTASONE   sulfamethoxazole-trimethoprim 800-160 MG tablet Commonly known as:  BACTRIM DS     TAKE these medications   allopurinol 100 MG tablet Commonly known as:  ZYLOPRIM Take 100 mg by mouth daily at 3 pm.   amLODipine 10 MG tablet Commonly known as:  NORVASC Take 10 mg by mouth at bedtime.   aspirin EC 325 MG tablet Take 162.5 mg by mouth daily.   doxazosin 8 MG tablet Commonly known as:  CARDURA Take 8 mg by mouth at bedtime.   glipiZIDE 5 MG tablet Commonly known as:  GLUCOTROL Take 10 mg by mouth 2 (two) times daily.   HYDROcodone-acetaminophen 5-325 MG tablet Commonly known as:  NORCO Take 1 tablet by mouth every 4 (four) hours as needed for moderate pain. Notes to patient:  Last dose given today at 5:35am   levofloxacin 500 MG tablet Commonly known as:  LEVAQUIN Take 1 tablet (500 mg total) by mouth daily for 10 days.   metoprolol tartrate 100 MG tablet Commonly known as:  LOPRESSOR Take 100 mg by mouth 2 (two) times daily.   ranitidine 150 MG capsule Commonly known as:  ZANTAC Take 150 mg by mouth daily at 3 pm.   simvastatin 10 MG tablet Commonly known as:  ZOCOR Take 10 mg by mouth every evening.   spironolactone 25 MG tablet Commonly known as:  ALDACTONE Take 25 mg by mouth at bedtime.   warfarin 6 MG tablet Commonly known as:  COUMADIN Take 6 mg by mouth every evening.          Total Time in preparing paper work, data evaluation and todays exam - 79 minutes  Dustin Flock M.D on 02/28/2018 at 3:02 PM [place]  [phone]

## 2018-02-28 NOTE — Consult Note (Signed)
ANTICOAGULATION CONSULT NOTE  Pharmacy Consult for warfarin dose management Indication: VTE prophylaxis  Patient Measurements: Height: 5\' 10"  (177.8 cm) Weight: 276 lb 10.8 oz (125.5 kg) IBW/kg (Calculated) : 73  Vital Signs: Temp: 98.7 F (37.1 C) (02/26 0556) Temp Source: Oral (02/26 0556) BP: 110/73 (02/26 0556) Pulse Rate: 75 (02/26 0556)  Labs: Recent Labs    02/26/18 1030 02/26/18 1258 02/27/18 0428 02/28/18 0520  HGB 9.5*  --  9.2* 9.5*  HCT 32.8*  --  31.2* 31.9*  PLT 244  --  215 227  LABPROT  --  24.8*  --  25.2*  INR  --  2.3  --  2.3*  CREATININE 1.32*  --  1.03 0.95    Estimated Creatinine Clearance: 107.2 mL/min (by C-G formula based on SCr of 0.95 mg/dL).   Medical History: Past Medical History:  Diagnosis Date  . Arthritis   . Ascending aortic dissection ([place])   . Diabetes mellitus without complication ([place])   . 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 Hyperlipemia   . Hypertension   . Kidney stones     Medications:  Scheduled:  . allopurinol  100 mg Oral Q1500  . aspirin EC  162 mg Oral Daily  . feeding supplement (NEPRO CARB STEADY)  237 mL Oral BID BM  . insulin aspart  0-5 Units Subcutaneous QHS  . insulin aspart  0-9 Units Subcutaneous TID WC  . levofloxacin  500 mg Oral Daily  . pneumococcal 23 valent vaccine  0.5 mL Intramuscular Tomorrow-1000  . Warfarin - Pharmacist Dosing Inpatient   Does not apply q1800    Assessment: 63 y.o. male with a known history of DVT on Coumadin who presents to the ED due to a hydrocele. He was admitted with a therapeutic INR on his home dose of 6 mg daily. His last dose of warfarin was 2/23. New DDIs include levofloxacin and Bactrim, which he received only a single dose of and is no longer taking. Hgb low at baseline but stable; will follow.  Goal of Therapy:  INR 2-3 Monitor platelets by anticoagulation protocol: Yes   Plan:  --INR remains therapeutic despite missed dose on  2/24 --Home dose was given yesterday due to a missed dose but the patient is still on Levaquin therefore a smaller than home dose of warfarin is warranted. --We will give a 4 mg dose of warfarin today and allow INRs to guide therapy  Dallie Piles, PharmD 02/28/2018,7:13 AM

## 2018-02-28 NOTE — Progress Notes (Signed)
   Subjective Afebrile overnight, continues to report left scrotal "soreness"  Physical Exam: BP 110/73 (BP Location: Right Arm)   Pulse 75   Temp 98.7 F (37.1 C) (Oral)   Resp 18   Ht 5\' 10"  (1.778 m)   Wt 125.5 kg   SpO2 95%   BMI 39.70 kg/m    Constitutional:  Alert and oriented, No acute distress. Respiratory: Normal respiratory effort, no increased work of breathing. GI: Abdomen is soft, non-tender, non-distended GU: No CVA tenderness, phallus without lesions, hydroceles bilaterally, scrotum tender to palpation bilaterally, worse on the left.  There is no erythema, induration, or drainage  Laboratory Data: Reviewed  Pertinent Imaging: Ultrasound and CT scan personally reviewed  Assessment & Plan:   Shane Bean is a 63 year old male with chronic pain who has had multiple ER visits over the last week for scrotal pain, worse on the left.  He has had a reassuring CT abdomen pelvis and 2 scrotal ultrasounds with no indication of obstructing ureteral stones or scrotal abscess.  He does have a culture documented staph aureus UTI from 2/20.  I suspect his pain is secondary to a UTI with orchitis.  He has numerous co-morbidities including anticoagulation for history of DVT, and poorly controlled diabetes.  Continue culture appropriate antibiotics, recommend 14-day course total Continue snug fitting underwear and anti-inflammatories We will arrange urology follow-up in 4 weeks for symptom check No plans for surgical intervention Call if questions  Billey Co, MD

## 2018-03-07 ENCOUNTER — Telehealth: Payer: Self-pay | Admitting: Urology

## 2018-03-07 NOTE — Telephone Encounter (Signed)
Has a question about his diagnosis for Hydrocele, Please advise

## 2018-03-09 ENCOUNTER — Telehealth: Payer: Self-pay | Admitting: Urology

## 2018-03-09 NOTE — Telephone Encounter (Signed)
Pt calling to state that his symptoms have not improved and swelling seems to be getting "bigger" pt also states he has been vomiting, not sure what's causing vomiting. Pt states he can't sleep at night due to pain, taking a lot of tylenol with no relief. Please advise pt at [phone] Pt states he has been calling for days to get some advice of what to do. pLease advise.

## 2018-03-09 NOTE — Telephone Encounter (Signed)
Patient spoke with [facility]. Appt made

## 2018-03-09 NOTE — Telephone Encounter (Signed)
Called pt advised him that per Dr.Sninsky hydrocele does not warrant narcotic pain medication. Advised pt on alternating motrin and tylenol in addition to warm compresses not applied directly to the skin. Pt gave verbal understanding.

## 2018-03-13 ENCOUNTER — Ambulatory Visit (INDEPENDENT_AMBULATORY_CARE_PROVIDER_SITE_OTHER): Payer: Medicare Other | Admitting: Urology

## 2018-03-13 ENCOUNTER — Encounter: Payer: Self-pay | Admitting: Urology

## 2018-03-13 VITALS — BP 124/75 | HR 66 | Ht 70.0 in | Wt 270.0 lb

## 2018-03-13 DIAGNOSIS — N453 Epididymo-orchitis: Secondary | ICD-10-CM

## 2018-03-13 NOTE — Progress Notes (Signed)
   03/13/2018 4:35 PM   [name] December 22, 1955 [id number]  Reason for visit: Follow up UTI  HPI: I saw Mr. Mode in urology clinic in hospital follow-up.  Briefly, he is an extremely comorbid 63 year old male with history of DVT and diabetes on anticoagulation with Coumadin who was admitted to the hospital 02/26/2018 with severe left scrotal pain.  He had a culture documented UTI at that time with greater than 100K staph aureus.  He had a CT scan and scrotal ultrasound that showed simple appearing hydroceles bilaterally.  There is no hydronephrosis or urolithiasis.  He was treated with a 14-day course of culture appropriate Bactrim.  He reports he is doing much better since his hospitalization.  He does have some mild residual left scrotal pain, but this continues to improve.  He continues to have some left-sided scrotal swelling which is also improved from discharge.  He denies any fevers or chills.  Denies any difficulty with urination.  On physical exam, he has minimal tenderness to palpation of the scrotum, there is a stable left hydrocele.  63 year old comorbid man recovering appropriately after hospitalization for likely UTI with epididymoorchitis.  His exam is benign today.  I provided reassurance that it can take many weeks for epididymoorchitis to completely resolve in terms of swelling and discomfort.  We discussed options for his hydrocele in the future if he continues to have significant left-sided pain, however his comorbidities make any intervention high risk.  I would recommend percutaneous drainage in clinic before a hydrocelectomy in the setting of his anticoagulation and poorly controlled diabetes.  RTC 8 weeks for symptom check  A total of 15 minutes were spent face-to-face with the patient, greater than 50% was spent in patient education, counseling, and coordination of care regarding UTI and epididymoorchitis and hydrocele.   Billey Co, [place]  [facility] [address] [place], [place] [address] [phone]

## 2018-03-13 NOTE — Patient Instructions (Signed)
Hydrocele, Adult  A hydrocele is a collection of fluid in the loose pouch of skin that holds the testicles (scrotum). This may happen because:  The amount of fluid produced in the scrotum is not absorbed by the rest of the body.  Fluid from the abdomen fills the scrotum. Normally, the testicles develop in the abdomen then move (drop) into to the scrotum before birth. The tube that the testicles travel through usually closes after the testicles drop. If the tube does not close, fluid from the abdomen can fill the scrotum. This is less common in adults.  What are the causes?  The cause of a hydrocele in adults is usually not known. However, it may be caused by:  An injury to the scrotum.  An infection (epididymitis).  Decreased blood flow to the scrotum.  Twisting of a testicle (testicular torsion).  A birth defect.  A tumor or cancer of the testicle.  What are the signs or symptoms?  A hydrocele feels like a water-filled balloon. It may also feel heavy. Other symptoms include:  Swelling of the scrotum. The swelling may decrease when you lie down. You may also notice more swelling at night than in the morning.  Swelling of the groin.  Mild discomfort in the scrotum.  Pain. This can develop if the hydrocele was caused by infection or twisting. The larger the hydrocele, the more likely you are to have pain.  How is this diagnosed?  This condition may be diagnosed based on:  Physical exam.  Medical history.  You may also have other tests, including:  Imaging tests, such as ultrasound.  Blood or urine tests.  How is this treated?  Most hydroceles go away on their own. If you have no discomfort or pain, your health care provider may suggest close monitoring of your condition (called watch and wait or watchful waiting) until the condition goes away or symptoms develop. If treatment is needed, it may include:  Treating an underlying condition. This may include using an antibiotic medicine to treat an infection.  Surgery to  stop fluid from collecting in the scrotum.  Surgery to drain the fluid. Options include:  Needle aspiration. A needle is used to drain fluid. However, the fluid buildup will come back quickly.  Hydrocelectomy. For this procedure, an incision is made in the scrotum to remove the fluid sac.  Follow these instructions at home:  Watch the hydrocele for any changes.  Take over-the-counter and prescription medicines only as told by your health care provider.  If you were prescribed an antibiotic medicine, use it as told by your health care provider. Do not stop taking the antibiotic even if you start to feel better.  Keep all follow-up visits as told by your health care provider. This is important.  Contact a health care provider if:  You notice any changes in the hydrocele.  The swelling in your scrotum or groin gets worse.  The hydrocele becomes red, firm, painful, or tender to the touch.  You have a fever.  Get help right away if you:  Develop a lot of pain, or your pain becomes worse.  Summary  A hydrocele is a collection of fluid in the loose pouch of skin that holds the testicles (scrotum).  Hydroceles can cause swelling, discomfort, and sometimes pain.  In adults, the cause of a hydrocele usually is not known. However, it is sometimes caused by an infection or a rotation and twisting of the scrotum.  Treatment is usually not   may be given to ease the pain. This information is not intended to replace advice given to you by your health care provider. Make sure you discuss any questions you have with your health care provider. Document Released: 06/09/2009 Document Revised: 12/31/2016 Document Reviewed: 12/31/2016 Elsevier Interactive Patient Education  2019 [employer]     Orchitis  Orchitis is inflammation of a testicle. Testicles are the male organs that  produce sperm. The testicles are held in a fleshy sac (scrotum) located behind the penis. Orchitis usually affects only one testicle, but it can affect both. Orchitis is caused by infection. Many kinds of bacteria and viruses can cause this infection. The condition can develop suddenly. What are the causes? This condition may be caused by:  Infection from viruses or bacteria.  Other organisms, such as fungi or parasites (rare). This is common in men who have a weak body defense system (immune system), such as men with HIV. Bacteria   Bacterial orchitis often occurs along with an infection of the tube that collects and stores sperm (epididymis).  In men who are not sexually active, this infection usually starts as a urinary tract infection and spreads to the testicle.  In sexually active men, sexually transmitted infections (STIs) are the most common cause of bacterial orchitis. These can include: ? Gonorrhea. ? Chlamydia. Viruses  Mumps is the most common cause of viral orchitis, though mumps is now rare in many areas because of vaccination.  Other viruses that can cause orchitis include: ? The chickenpox virus (varicella-zoster virus). ? The virus that causes mononucleosis (Epstein-Barr virus). What increases the risk? The following factors may make you more likely to develop this condition:  For viral orchitis: ? Not having been vaccinated against mumps.  For bacterial orchitis: ? Having had frequent urinary tract infections. ? Engaging in high-risk sexual behaviors, such as having multiple sexual partners or having sex without using a condom. ? Having a sexual partner with an STI. ? Having had urinary tract surgery. ? Using a tube that is passed through the penis to drain urine (Foley catheter). ? Having an enlarged prostate gland. What are the signs or symptoms? The most common symptoms of orchitis are swelling and pain in the scrotum. Other signs and symptoms may  include:  Feeling generally sick (malaise).  Fever and chills.  Painful urination.  Painful ejaculation.  Headache.  Fatigue.  Nausea.  Blood or discharge from the penis.  Swollen lymph nodes in the groin area (inguinal nodes). How is this diagnosed? This condition may be diagnosed based on:  Your symptoms. Your health care provider may suspect orchitis if you have a painful, swollen testicle along with other signs and symptoms of the condition.  A physical exam. You may also have other tests, including:  A blood test to check for signs of infection.  A urine test to check for a urinary tract infection or STI.  Using a swab to collect a fluid sample from the tip of the penis to test for STIs.  Taking an image of the testicle using sound waves and a computer (testicular ultrasound). How is this treated? Treatment for this condition depends on the cause.  For bacterial orchitis, your health care provider may prescribe antibiotic medicines. Bacterial infections usually clear up within a few days. For both viral infections and bacterial infections, you may be treated with:  Rest.  Anti-inflammatory medicines.  Pain medicines.  Raising (elevating) the scrotum with a towel or pillow underneath and applying ice.  Follow these instructions at home:  Rest as directed by your health care provider.  Take over-the-counter and prescription medicines only as told by your health care provider.  If you were prescribed an antibiotic medicine, take it as told by your health care provider. Do not stop taking the antibiotic even if you start to feel better.  Do not have sex until your health care provider says it is okay to do so.  Elevate your scrotum and apply ice as directed: ? Put ice in a plastic bag. ? Place a small towel or pillow between your legs. ? Rest your scrotum on the pillow or towel. ? Place another towel between your skin and the plastic bag. ? Leave the ice  on for 20 minutes, 2-3 times a day.  Keep all follow-up visits as told by your health care provider. This is important. Contact a health care provider if:  You have a fever.  Pain and swelling have not gotten better after 3 days. Get help right away if:  Your pain is getting worse.  The swelling in your testicle gets worse. Summary  Orchitis is inflammation of a testicle. It is caused by an infection from bacteria or a virus.  The most common symptoms of orchitis are swelling and pain in the scrotum.  Treatment for this condition depends on the cause. It may include medicines to fight the infection, reduce inflammation, and relieve the pain.  Follow your health care provider's instructions about resting, icing, not having sex, and taking medicines. This information is not intended to replace advice given to you by your health care provider. Make sure you discuss any questions you have with your health care provider. Document Released: 12/18/1999 Document Revised: 01/06/2017 Document Reviewed: 01/06/2017 Elsevier Interactive Patient Education  2019 [employer].

## 2018-03-30 ENCOUNTER — Telehealth: Payer: Self-pay | Admitting: Urology

## 2018-03-30 NOTE — Telephone Encounter (Signed)
Pt called and states that he is still having pain in his groin and can not wait until 05/10/2018. Please advise.

## 2018-03-30 NOTE — Telephone Encounter (Signed)
This is a challenging patient with chronic pain, and normal imaging in the last month. We can have him drop off a urine sample next week to see if has another UTI, but he may be better suited for chronic pain clinic. I am not prescribing any additional narcotics. He was doing well when I saw him a few weeks ago and his pain had almost completely resolved.  Nickolas Madrid, MD 03/30/2018

## 2018-03-30 NOTE — Telephone Encounter (Signed)
I have spoken with this patient in the past, he does seem to have significant ongoing pain. Is he a candidate to be moved up? Please advise on scheduling. Thanks

## 2018-04-02 ENCOUNTER — Telehealth: Payer: Self-pay | Admitting: Urology

## 2018-04-02 NOTE — Telephone Encounter (Signed)
Patient called stating that the hydrocele has not improved and that it is still the same size it was when he came in for his first appointment. He said that it has gotten harder? He said that the pain is not all the time. I mentioned  Him coming in to provide a UA but he said he doesn't know why he would have any kind of infection in his body?  Please have a clinical staff call him.   Sharyn Lull

## 2018-04-02 NOTE — Telephone Encounter (Signed)
Spoke with [place] and he stated that we unfortunately are unable to treat the hydrocele at this time. It is stable and until we are able to do surgery this will have to be put on hold. I tried calling the patient back to let him know but his voicemail was not set up.  Sharyn Lull

## 2018-04-03 NOTE — Telephone Encounter (Signed)
Sharyn Lull attempted to contact pt no answer. See documentation

## 2018-04-04 ENCOUNTER — Ambulatory Visit: Payer: Self-pay | Admitting: Urology

## 2018-05-07 ENCOUNTER — Telehealth (INDEPENDENT_AMBULATORY_CARE_PROVIDER_SITE_OTHER): Payer: Medicare Other | Admitting: Urology

## 2018-05-07 ENCOUNTER — Other Ambulatory Visit: Payer: Self-pay

## 2018-05-07 DIAGNOSIS — N433 Hydrocele, unspecified: Secondary | ICD-10-CM | POA: Diagnosis not present

## 2018-05-07 NOTE — Progress Notes (Signed)
Virtual Visit via Telephone Note  I connected with Shane Bean on 05/07/18 at  9:30 AM EDT by telephone and verified that I am speaking with the correct person using two identifiers.   I discussed the limitations, risks, security and privacy concerns of performing an evaluation and management service by telephone and the availability of in person appointments. We discussed the impact of the COVID-19 on the healthcare system, and the importance of social distancing and reducing patient and provider exposure. I also discussed with the patient that there may be a patient responsible charge related to this service. The patient expressed understanding and agreed to proceed.  Reason for visit: Follow up left hydrocele, hx of UTI/orchitis  History of Present Illness: Shane Bean is a 63 year old extremely co-morbid male with history of DVT/PE, CHF, uncontrolled diabetes, and history of aortic arch and aortic valve repair in 2015, and chronic anticoagulation with Coumadin who presents for follow-up of a left hydrocele.  Briefly, he was hospitalized in February 2020 for uncontrol left scrotal pain, grossly infected urine with >100k Staph Aureus, benign scrotal ultrasound, and likely UTI/left epididymoorchitis.  His pain improved with antibiotics, splint fitting underwear, and anti-inflammatories.  I last saw him 03/13/2018 in hospital follow-up, and he was doing relatively well with decreased left-sided scrotal pain.  He reports since then he is continued to improve.  He denies any significant scrotal pain, however he is bothered by the bulk of his left hydrocele and it can be uncomfortable when he lays down to sleep.  He denies any flank pain, hematuria, dysuria, or fevers.  He remains on chronic anticoagulation Coumadin.  Assessment and Plan: In summary, the patient is a 63 year old extremely co-morbid male on chronic anticoagulation with a stable left hydrocele.  His pain has improved over the last few  months, but he continues to have some left-sided scrotal discomfort secondary to the bulk of his moderate size left hydrocele.  I had a very long and frank conversation with the patient today about his numerous comorbidities putting him at high risk for any surgical intervention.  We discussed options including ongoing conservative management with snug fitting underwear, clinic aspiration of hydrocele with high risk for recurrence, or definitive management with a left hydrocelectomy.  He would have to be off all anticoagulation for any type of surgical intervention, and with his poorly controlled diabetes he would be at high risk for wound infection and poor wound healing.  I strongly recommended conservative management as he does not seem to be severely bothered by the hydrocele.  Additionally, in the setting of the COVID-19 pandemic, I reiterated that we are not performing any elective surgeries at this time.  Follow Up: -RTC 2 to 3 months for symptom check   I discussed the assessment and treatment plan with the patient. The patient was provided an opportunity to ask questions and all were answered. The patient agreed with the plan and demonstrated an understanding of the instructions.   The patient was advised to call back or seek an in-person evaluation if the symptoms worsen or if the condition fails to improve as anticipated.  I provided 15 minutes of non-face-to-face time during this encounter.   Billey Co, MD

## 2018-05-10 ENCOUNTER — Ambulatory Visit: Payer: Medicare Other | Admitting: Urology

## 2018-07-18 ENCOUNTER — Encounter: Payer: Self-pay | Admitting: Urology

## 2018-07-18 ENCOUNTER — Ambulatory Visit: Payer: Medicare Other | Admitting: Urology

## 2018-10-23 ENCOUNTER — Other Ambulatory Visit: Payer: Self-pay | Admitting: *Deleted

## 2018-10-23 DIAGNOSIS — Z20822 Contact with and (suspected) exposure to covid-19: Secondary | ICD-10-CM

## 2018-10-24 LAB — NOVEL CORONAVIRUS, NAA: SARS-CoV-2, NAA: NOT DETECTED

## 2018-12-07 ENCOUNTER — Other Ambulatory Visit: Payer: Self-pay

## 2018-12-07 DIAGNOSIS — Z20822 Contact with and (suspected) exposure to covid-19: Secondary | ICD-10-CM

## 2018-12-09 LAB — NOVEL CORONAVIRUS, NAA: SARS-CoV-2, NAA: NOT DETECTED

## 2018-12-10 ENCOUNTER — Telehealth: Payer: Self-pay

## 2018-12-10 NOTE — Telephone Encounter (Signed)

## 2019-01-16 ENCOUNTER — Ambulatory Visit: Payer: Medicare PPO | Attending: Internal Medicine

## 2019-01-16 DIAGNOSIS — Z20822 Contact with and (suspected) exposure to covid-19: Secondary | ICD-10-CM

## 2019-01-17 LAB — NOVEL CORONAVIRUS, NAA: SARS-CoV-2, NAA: NOT DETECTED

## 2019-02-27 ENCOUNTER — Ambulatory Visit: Payer: Medicare PPO | Attending: Internal Medicine

## 2019-02-27 DIAGNOSIS — Z20822 Contact with and (suspected) exposure to covid-19: Secondary | ICD-10-CM

## 2019-02-28 LAB — NOVEL CORONAVIRUS, NAA: SARS-CoV-2, NAA: NOT DETECTED

## 2019-04-17 ENCOUNTER — Telehealth: Payer: Self-pay

## 2019-04-17 ENCOUNTER — Other Ambulatory Visit: Payer: Self-pay

## 2019-04-17 DIAGNOSIS — Z85038 Personal history of other malignant neoplasm of large intestine: Secondary | ICD-10-CM

## 2019-04-17 NOTE — Telephone Encounter (Signed)
Pt has been scheduled for a repeat colonoscopy with Wohl at [facility] on 05/21/19. Paperwork mailed. Request for blood thinner clearance has been sent to pt's PCP.

## 2019-04-17 NOTE — Telephone Encounter (Signed)
Patient says he's over due for his colon procedure. Says Dr. Allen Norris recommends he get one every year. Hasn't had one since 2019 due to Covid. Please research and advise.

## 2019-04-29 ENCOUNTER — Telehealth: Payer: Self-pay

## 2019-04-29 NOTE — Telephone Encounter (Signed)
Patient left a voicemail to find out if he still needed the COVID test on 05/17/2019 since he has had both COVID vaccines. Tried to call patient back to inform patient he did but the voicemail is not set up

## 2019-05-15 ENCOUNTER — Telehealth: Payer: Self-pay

## 2019-05-15 NOTE — Telephone Encounter (Signed)
Pt notified today, 05/15/19 to stop his Warfarin tomorrow, 05/16/19 5 days prior to his colonoscopy. See clearance scanned into chart.

## 2019-05-17 ENCOUNTER — Other Ambulatory Visit: Payer: Self-pay

## 2019-05-17 ENCOUNTER — Other Ambulatory Visit
Admission: RE | Admit: 2019-05-17 | Discharge: 2019-05-17 | Disposition: A | Discharge status: Home / Self Care | Payer: Medicare PPO | Source: Ambulatory Visit | Attending: Gastroenterology | Admitting: Gastroenterology

## 2019-05-17 DIAGNOSIS — Z01812 Encounter for preprocedural laboratory examination: Secondary | ICD-10-CM | POA: Diagnosis present

## 2019-05-17 DIAGNOSIS — Z20822 Contact with and (suspected) exposure to covid-19: Secondary | ICD-10-CM | POA: Diagnosis not present

## 2019-05-17 LAB — SARS CORONAVIRUS 2 (TAT 6-24 HRS): SARS Coronavirus 2: NEGATIVE

## 2019-05-20 ENCOUNTER — Encounter: Payer: Self-pay | Admitting: Gastroenterology

## 2019-05-21 ENCOUNTER — Ambulatory Visit: Payer: Medicare PPO | Admitting: Anesthesiology

## 2019-05-21 ENCOUNTER — Other Ambulatory Visit: Payer: Self-pay

## 2019-05-21 ENCOUNTER — Encounter: Payer: Self-pay | Admitting: Gastroenterology

## 2019-05-21 ENCOUNTER — Ambulatory Visit
Admission: RE | Admit: 2019-05-21 | Discharge: 2019-05-21 | Disposition: A | Discharge status: Home / Self Care | Payer: Medicare PPO | Attending: Gastroenterology | Admitting: Gastroenterology

## 2019-05-21 ENCOUNTER — Encounter
Admission: RE | Disposition: A | Discharge status: Home / Self Care | Payer: Self-pay | Source: Home / Self Care | Attending: Gastroenterology

## 2019-05-21 DIAGNOSIS — Z7982 Long term (current) use of aspirin: Secondary | ICD-10-CM | POA: Insufficient documentation

## 2019-05-21 DIAGNOSIS — K5730 Diverticulosis of large intestine without perforation or abscess without bleeding: Secondary | ICD-10-CM | POA: Diagnosis not present

## 2019-05-21 DIAGNOSIS — K635 Polyp of colon: Secondary | ICD-10-CM

## 2019-05-21 DIAGNOSIS — E119 Type 2 diabetes mellitus without complications: Secondary | ICD-10-CM | POA: Insufficient documentation

## 2019-05-21 DIAGNOSIS — I10 Essential (primary) hypertension: Secondary | ICD-10-CM | POA: Diagnosis not present

## 2019-05-21 DIAGNOSIS — Z1211 Encounter for screening for malignant neoplasm of colon: Secondary | ICD-10-CM | POA: Insufficient documentation

## 2019-05-21 DIAGNOSIS — D123 Benign neoplasm of transverse colon: Secondary | ICD-10-CM | POA: Insufficient documentation

## 2019-05-21 DIAGNOSIS — Z86010 Personal history of colonic polyps: Secondary | ICD-10-CM | POA: Insufficient documentation

## 2019-05-21 DIAGNOSIS — Z86718 Personal history of other venous thrombosis and embolism: Secondary | ICD-10-CM | POA: Diagnosis not present

## 2019-05-21 DIAGNOSIS — Z7901 Long term (current) use of anticoagulants: Secondary | ICD-10-CM | POA: Insufficient documentation

## 2019-05-21 DIAGNOSIS — Z79899 Other long term (current) drug therapy: Secondary | ICD-10-CM | POA: Diagnosis not present

## 2019-05-21 DIAGNOSIS — Z888 Allergy status to other drugs, medicaments and biological substances status: Secondary | ICD-10-CM | POA: Insufficient documentation

## 2019-05-21 DIAGNOSIS — M1990 Unspecified osteoarthritis, unspecified site: Secondary | ICD-10-CM | POA: Insufficient documentation

## 2019-05-21 DIAGNOSIS — Z7984 Long term (current) use of oral hypoglycemic drugs: Secondary | ICD-10-CM | POA: Insufficient documentation

## 2019-05-21 DIAGNOSIS — I7100 Dissection of unspecified site of aorta: Secondary | ICD-10-CM | POA: Diagnosis not present

## 2019-05-21 DIAGNOSIS — Z85038 Personal history of other malignant neoplasm of large intestine: Secondary | ICD-10-CM

## 2019-05-21 DIAGNOSIS — E785 Hyperlipidemia, unspecified: Secondary | ICD-10-CM | POA: Diagnosis not present

## 2019-05-21 HISTORY — PX: COLONOSCOPY WITH PROPOFOL: SHX5780

## 2019-05-21 HISTORY — DX: Acute embolism and thrombosis of unspecified deep veins of unspecified lower extremity: I82.409

## 2019-05-21 LAB — GLUCOSE, CAPILLARY
Glucose-Capillary: 223 mg/dL — ABNORMAL HIGH (ref 70–99)
Glucose-Capillary: 189 mg/dL — ABNORMAL HIGH (ref 70–99)

## 2019-05-21 SURGERY — COLONOSCOPY WITH PROPOFOL
Anesthesia: General

## 2019-05-21 MED ORDER — PROPOFOL 10 MG/ML IV BOLUS
INTRAVENOUS | Status: DC | PRN
  Administered 2019-05-21: 80 mg via INTRAVENOUS
  Administered 2019-05-21: 20 mg via INTRAVENOUS

## 2019-05-21 MED ORDER — PROPOFOL 500 MG/50ML IV EMUL
INTRAVENOUS | Status: AC
  Filled 2019-05-21: qty 50

## 2019-05-21 MED ORDER — PROPOFOL 500 MG/50ML IV EMUL
INTRAVENOUS | Status: DC | PRN
  Administered 2019-05-21: 150 ug/kg/min via INTRAVENOUS

## 2019-05-21 MED ORDER — PROPOFOL 10 MG/ML IV BOLUS
INTRAVENOUS | Status: AC
  Filled 2019-05-21: qty 20

## 2019-05-21 MED ORDER — SPOT INK MARKER SYRINGE KIT
PACK | SUBMUCOSAL | Status: DC | PRN
  Administered 2019-05-21: 5 mL via SUBMUCOSAL

## 2019-05-21 MED ORDER — LIDOCAINE 2% (20 MG/ML) 5 ML SYRINGE
INTRAMUSCULAR | Status: DC | PRN
  Administered 2019-05-21: 50 mg via INTRAVENOUS

## 2019-05-21 MED ORDER — SODIUM CHLORIDE 0.9 % IV SOLN: INTRAVENOUS | Status: DC

## 2019-05-21 NOTE — Op Note (Signed)
[facility] Gastroenterology Patient Name: Shane Bean Procedure Date: 05/21/2019 9:56 AM MRN: [id number] Account #: [id number] Date of Birth: December 20, 1955 Admit Type: Outpatient Age: 64 Room: [facility] ENDO ROOM 4 Gender: Male Note Status: Finalized Procedure:             Colonoscopy Indications:           High risk colon cancer surveillance: Personal history                         of colonic polyps Providers:             Lucilla Lame MD, MD Referring MD:          Janyth Pupa NP Medicines:             Propofol per Anesthesia Complications:         No immediate complications. Procedure:             Pre-Anesthesia Assessment:                        - Prior to the procedure, a History and Physical was                         performed, and patient medications and allergies were                         reviewed. The patient's tolerance of previous                         anesthesia was also reviewed. The risks and benefits                         of the procedure and the sedation options and risks                         were discussed with the patient. All questions were                         answered, and informed consent was obtained. Prior                         Anticoagulants: The patient has taken no previous                         anticoagulant or antiplatelet agents. ASA Grade                         Assessment: II - A patient with mild systemic disease.                         After reviewing the risks and benefits, the patient                         was deemed in satisfactory condition to undergo the                         procedure.  After obtaining informed consent, the colonoscope was                         passed under direct vision. Throughout the procedure,                         the patient's blood pressure, pulse, and oxygen                         saturations were monitored continuously. The        Colonoscope was introduced through the anus and                         advanced to the the ileocolonic anastomosis. The                         colonoscopy was performed without difficulty. The                         patient tolerated the procedure well. The quality of                         the bowel preparation was good. Findings:      The perianal and digital rectal examinations were normal.      A 15 mm polyp was found in the transverse colon. The polyp was sessile.       Area was successfully injected with 5 mL saline with indigo carmine for       a lift polypectomy. Area was successfully injected with 5 mL [place] ink       for tattooing. The polyp was removed with a hot snare. Resection and       retrieval were complete. To prevent bleeding post-intervention, four       hemostatic clips were successfully placed (MR conditional). There was no       bleeding at the end of the procedure.      Multiple small-mouthed diverticula were found in the entire colon. Impression:            - One 15 mm polyp in the transverse colon, removed                         with a hot snare. Resected and retrieved. Injected.                         Clips (MR conditional) were placed.                        - Diverticulosis in the entire examined colon. Recommendation:        - Discharge patient to home.                        - Resume previous diet.                        - Continue present medications.                        - Await pathology results.                        -  Repeat colonoscopy in 1 year for surveillance. Procedure Code(s):     --- Professional ---                        [phone], Colonoscopy, flexible; with removal of                         tumor(s), polyp(s), or other lesion(s) by snare                         technique                        [id number], Colonoscopy, flexible; with directed submucosal                         injection(s), any substance Diagnosis Code(s):     ---  Professional ---                        Z86.010, Personal history of colonic polyps                        K63.5, Polyp of colon CPT copyright 2019 [employer]. All rights reserved. The codes documented in this report are preliminary and upon coder review may  be revised to meet current compliance requirements. Lucilla Lame MD, MD 05/21/2019 10:46:55 AM This report has been signed electronically. Number of Addenda: 0 Note Initiated On: 05/21/2019 9:56 AM Scope Withdrawal Time: 0 hours 31 minutes 43 seconds  Total Procedure Duration: 0 hours 35 minutes 58 seconds  Estimated Blood Loss:  Estimated blood loss: none.      [facility]

## 2019-05-21 NOTE — H&P (Signed)
Lucilla Lame, MD [facility] [address], [place], [place] [address] Phone:[phone] Fax : [phone]  Primary Care Physician:  Janyth Pupa, NP Primary Gastroenterologist:  Dr. Allen Norris  Pre-Procedure History & Physical: HPI:  Shane Bean is a 64 y.o. male is here for an colonoscopy.   Past Medical History:  Diagnosis Date  . Arthritis   . Ascending aortic dissection ([place])   . Diabetes mellitus without complication ([place])   . DVT (deep venous thrombosis) (HCC)    legs and lungs   . Hyperlipemia   . Hypertension   . Kidney stones     Past Surgical History:  Procedure Laterality Date  . CARDIAC SURGERY    . COLONOSCOPY WITH PROPOFOL N/A 04/04/2017   Procedure: COLONOSCOPY WITH PROPOFOL;  Surgeon: Lucilla Lame, MD;  Location: [facility] ENDOSCOPY;  Service: Endoscopy;  Laterality: N/A;  . DENTAL SURGERY    . ESOPHAGOGASTRODUODENOSCOPY (EGD) WITH PROPOFOL N/A 04/04/2017   Procedure: ESOPHAGOGASTRODUODENOSCOPY (EGD) WITH PROPOFOL;  Surgeon: Lucilla Lame, MD;  Location: [facility] ENDOSCOPY;  Service: Endoscopy;  Laterality: N/A;  . LAPAROSCOPIC RIGHT HEMI COLECTOMY Right 08/17/2017   Procedure: LAPAROSCOPIC  RIGHT HEMI COLECTOMY, COLONOSCOPY, UMBILICAL HERNIA REPAIR;  Surgeon: Ileana Roup, MD;  Location: [facility] ORS;  Service: General;  Laterality: Right;  . REPAIR THORACIC AORTA      Prior to Admission medications   Medication Sig Start Date End Date Taking? Authorizing Provider  allopurinol (ZYLOPRIM) 100 MG tablet Take 100 mg by mouth daily at 3 pm.   Yes [provider]  amLODipine (NORVASC) 10 MG tablet Take 10 mg by mouth at bedtime.   Yes [provider]  doxazosin (CARDURA) 8 MG tablet Take 8 mg by mouth at bedtime.    Yes [provider]  glipiZIDE (GLUCOTROL) 5 MG tablet Take 10 mg by mouth 2 (two) times daily.   Yes [provider]  metoprolol tartrate (LOPRESSOR) 100 MG tablet Take 100 mg by mouth 2 (two) times daily.    Yes [provider]  ranitidine (ZANTAC) 150 MG capsule Take 150 mg by mouth daily at 3 pm.  10/15/13  Yes [provider]  simvastatin (ZOCOR) 10 MG tablet Take 10 mg by mouth every evening.   Yes [provider]  spironolactone (ALDACTONE) 25 MG tablet Take 25 mg by mouth at bedtime.   Yes [provider]  aspirin EC 325 MG tablet Take 162.5 mg by mouth daily.  10/15/13   [provider]  HYDROcodone-acetaminophen (NORCO) 5-325 MG tablet Take 1 tablet by mouth every 4 (four) hours as needed for moderate pain. 02/28/18   Dustin Flock, MD  warfarin (COUMADIN) 6 MG tablet Take 6 mg by mouth every evening.    [provider]    Allergies as of 04/18/2019 - Review Complete 03/13/2018  Allergen Reaction Noted  . Chlorthalidone Swelling 05/11/2011  . Hydralazine Other (See Comments) 06/22/2011  . Lisinopril Swelling 05/21/2012    History reviewed. No pertinent family history.  Social History   Socioeconomic History  . Marital status: Single    Spouse name: Not on file  . Number of children: Not on file  . Years of education: Not on file  . Highest education level: Not on file  Occupational History  . Not on file  Tobacco Use  . Smoking status: Never Smoker  . Smokeless tobacco: Never Used  Substance and Sexual Activity  . Alcohol use: Yes    Alcohol/week: 28.0 standard drinks  Types: 28 Cans of beer per week    Comment: none olast 24hrs  . Drug use: No  . Sexual activity: Not on file  Other Topics Concern  . Not on file  Social History Narrative  . Not on file   Social Determinants of Health   Financial Resource Strain:   . Difficulty of Paying Living Expenses:   Food Insecurity:   . Worried About Charity fundraiser in the Last Year:   . Arboriculturist in the Last Year:   Transportation Needs:   . Film/video editor (Medical):   Marland Kitchen Lack of Transportation (Non-Medical):   Physical Activity:   . Days of  Exercise per Week:   . Minutes of Exercise per Session:   Stress:   . Feeling of Stress :   Social Connections:   . Frequency of Communication with Friends and Family:   . Frequency of Social Gatherings with Friends and Family:   . Attends Religious Services:   . Active Member of Clubs or Organizations:   . Attends Archivist Meetings:   Marland Kitchen Marital Status:   Intimate Partner Violence:   . Fear of Current or Ex-Partner:   . Emotionally Abused:   Marland Kitchen Physically Abused:   . Sexually Abused:     Review of Systems: See HPI, otherwise negative ROS  Physical Exam: BP (!) 168/90   Pulse 80   Temp 97.9 F (36.6 C) (Temporal)   Resp (!) 22   Ht 5\' 10"  (1.778 m)   Wt 129.3 kg   SpO2 96%   BMI 40.89 kg/m  General:   Alert,  pleasant and cooperative in NAD Head:  Normocephalic and atraumatic. Neck:  Supple; no masses or thyromegaly. Lungs:  Clear throughout to auscultation.    Heart:  Regular rate and rhythm. Abdomen:  Soft, nontender and nondistended. Normal bowel sounds, without guarding, and without rebound.   Neurologic:  Alert and  oriented x4;  grossly normal neurologically.  Impression/Plan: Shane Bean is here for an colonoscopy to be performed for cecal 4 cm adenomatous polyp with high-grade dysplasia last year.  The lesion was removed surgically.  Risks, benefits, limitations, and alternatives regarding  colonoscopy have been reviewed with the patient.  Questions have been answered.  All parties agreeable.   Lucilla Lame, MD  05/21/2019, 9:26 AM

## 2019-05-21 NOTE — OR Nursing (Signed)
PER DR [email or web address] TO RESUME COUMADIN IN 3DAYS ON Thursday. PT VERBALIZED UNDERSTANDING

## 2019-05-21 NOTE — Transfer of Care (Signed)
Immediate Anesthesia Transfer of Care Note  Patient: Shane Bean  Procedure(s) Performed: COLONOSCOPY WITH PROPOFOL (N/A )  Patient Location: Endoscopy Unit  Anesthesia Type:General  Level of Consciousness: awake  Airway & Oxygen Therapy: Patient connected to nasal cannula oxygen  Post-op Assessment: Post -op Vital signs reviewed and stable  Post vital signs: stable  Last Vitals:  Vitals Value Taken Time  BP 96/74 05/21/19 1048  Temp    Pulse 86 05/21/19 1048  Resp 32 05/21/19 1048  SpO2 97 % 05/21/19 1048  Vitals shown include unvalidated device data.  Last Pain:  Vitals:   05/21/19 0913  TempSrc: Temporal  PainSc: 0-No pain         Complications: No apparent anesthesia complications

## 2019-05-21 NOTE — Anesthesia Preprocedure Evaluation (Signed)
Anesthesia Evaluation  Patient identified by MRN, date of birth, ID band Patient awake    Reviewed: Allergy & Precautions, H&P , NPO status , Patient's Chart, lab work & pertinent test results, reviewed documented beta blocker date and time   Airway Mallampati: III   Neck ROM: full    Dental  (+) Poor Dentition   Pulmonary neg pulmonary ROS,    Pulmonary exam normal        Cardiovascular Exercise Tolerance: Poor hypertension, On Medications Normal cardiovascular exam+ Valvular Problems/Murmurs AS  Rhythm:regular Rate:Normal  Sp repair of aortic valve.    Neuro/Psych negative neurological ROS  negative psych ROS   GI/Hepatic Neg liver ROS, GERD  Medicated,  Endo/Other  diabetes, Well Controlled, Type 2, Oral Hypoglycemic AgentsMorbid obesity  Renal/GU Renal disease  negative genitourinary   Musculoskeletal   Abdominal   Peds  Hematology  (+) Blood dyscrasia, anemia ,   Anesthesia Other Findings Past Medical History: No date: Arthritis No date: Ascending aortic dissection (HCC) No date: Diabetes mellitus without complication (HCC) No date: DVT (deep venous thrombosis) (HCC)     Comment:  legs and lungs  No date: Hyperlipemia No date: Hypertension No date: Kidney stones Past Surgical History: No date: CARDIAC SURGERY 04/04/2017: COLONOSCOPY WITH PROPOFOL; N/A     Comment:  Procedure: COLONOSCOPY WITH PROPOFOL;  Surgeon: Lucilla Lame, MD;  Location: [facility] ENDOSCOPY;  Service:               Endoscopy;  Laterality: N/A; No date: DENTAL SURGERY 04/04/2017: ESOPHAGOGASTRODUODENOSCOPY (EGD) WITH PROPOFOL; N/A     Comment:  Procedure: ESOPHAGOGASTRODUODENOSCOPY (EGD) WITH               PROPOFOL;  Surgeon: Lucilla Lame, MD;  Location: [facility]               ENDOSCOPY;  Service: Endoscopy;  Laterality: N/A; 08/17/2017: LAPAROSCOPIC RIGHT HEMI COLECTOMY; Right     Comment:  Procedure: LAPAROSCOPIC  RIGHT HEMI  COLECTOMY,               COLONOSCOPY, UMBILICAL HERNIA REPAIR;  Surgeon: Ileana Roup, MD;  Location: [facility] ORS;  Service: General;               Laterality: Right; No date: REPAIR THORACIC AORTA BMI    Body Mass Index: 40.89 kg/m     Reproductive/Obstetrics negative OB ROS                             Anesthesia Physical Anesthesia Plan  ASA: III  Anesthesia Plan: General   Post-op Pain Management:    Induction:   PONV Risk Score and Plan:   Airway Management Planned:   Additional Equipment:   Intra-op Plan:   Post-operative Plan:   Informed Consent: I have reviewed the patients History and Physical, chart, labs and discussed the procedure including the risks, benefits and alternatives for the proposed anesthesia with the patient or authorized representative who has indicated his/her understanding and acceptance.     Dental Advisory Given  Plan Discussed with: CRNA  Anesthesia Plan Comments:         Anesthesia Quick Evaluation

## 2019-05-21 NOTE — OR Nursing (Signed)
Extent reached at 10:44 on; surgical anastomosis reached and withdrawal started at 10:44

## 2019-05-22 ENCOUNTER — Encounter: Payer: Self-pay | Admitting: *Deleted

## 2019-05-22 ENCOUNTER — Encounter: Payer: Self-pay | Admitting: Gastroenterology

## 2019-05-22 LAB — SURGICAL PATHOLOGY

## 2019-05-24 ENCOUNTER — Encounter: Payer: Self-pay | Admitting: Emergency Medicine

## 2019-05-24 ENCOUNTER — Emergency Department: Payer: Medicare PPO

## 2019-05-24 ENCOUNTER — Emergency Department
Admission: EM | Admit: 2019-05-24 | Discharge: 2019-05-24 | Disposition: A | Discharge status: Home / Self Care | Payer: Medicare PPO | Attending: Emergency Medicine | Admitting: Emergency Medicine

## 2019-05-24 ENCOUNTER — Other Ambulatory Visit: Payer: Self-pay

## 2019-05-24 DIAGNOSIS — I10 Essential (primary) hypertension: Secondary | ICD-10-CM | POA: Diagnosis not present

## 2019-05-24 DIAGNOSIS — R109 Unspecified abdominal pain: Secondary | ICD-10-CM | POA: Insufficient documentation

## 2019-05-24 DIAGNOSIS — Z7984 Long term (current) use of oral hypoglycemic drugs: Secondary | ICD-10-CM | POA: Diagnosis not present

## 2019-05-24 DIAGNOSIS — K625 Hemorrhage of anus and rectum: Secondary | ICD-10-CM | POA: Diagnosis present

## 2019-05-24 DIAGNOSIS — Z79899 Other long term (current) drug therapy: Secondary | ICD-10-CM | POA: Insufficient documentation

## 2019-05-24 DIAGNOSIS — Z7901 Long term (current) use of anticoagulants: Secondary | ICD-10-CM | POA: Insufficient documentation

## 2019-05-24 DIAGNOSIS — E119 Type 2 diabetes mellitus without complications: Secondary | ICD-10-CM | POA: Diagnosis not present

## 2019-05-24 DIAGNOSIS — K922 Gastrointestinal hemorrhage, unspecified: Secondary | ICD-10-CM | POA: Insufficient documentation

## 2019-05-24 LAB — URINALYSIS, COMPLETE (UACMP) WITH MICROSCOPIC
Bacteria, UA: NONE SEEN
Bilirubin Urine: NEGATIVE
Glucose, UA: NEGATIVE mg/dL
Hgb urine dipstick: NEGATIVE
Ketones, ur: 5 mg/dL — AB
Nitrite: NEGATIVE
Protein, ur: NEGATIVE mg/dL
Specific Gravity, Urine: 1.024 (ref 1.005–1.030)
pH: 5 (ref 5.0–8.0)

## 2019-05-24 LAB — CBC WITH DIFFERENTIAL/PLATELET
Abs Immature Granulocytes: 0.04 10*3/uL (ref 0.00–0.07)
Basophils Absolute: 0 10*3/uL (ref 0.0–0.1)
Basophils Relative: 0 %
Eosinophils Absolute: 0.1 10*3/uL (ref 0.0–0.5)
Eosinophils Relative: 2 %
HCT: 31.2 % — ABNORMAL LOW (ref 39.0–52.0)
Hemoglobin: 9 g/dL — ABNORMAL LOW (ref 13.0–17.0)
Immature Granulocytes: 1 %
Lymphocytes Relative: 21 %
Lymphs Abs: 1.4 10*3/uL (ref 0.7–4.0)
MCH: 20.8 pg — ABNORMAL LOW (ref 26.0–34.0)
MCHC: 28.8 g/dL — ABNORMAL LOW (ref 30.0–36.0)
MCV: 72.2 fL — ABNORMAL LOW (ref 80.0–100.0)
Monocytes Absolute: 0.6 10*3/uL (ref 0.1–1.0)
Monocytes Relative: 9 %
Neutro Abs: 4.5 10*3/uL (ref 1.7–7.7)
Neutrophils Relative %: 67 %
Platelets: 212 10*3/uL (ref 150–400)
RBC: 4.32 MIL/uL (ref 4.22–5.81)
RDW: 20.6 % — ABNORMAL HIGH (ref 11.5–15.5)
WBC: 6.6 10*3/uL (ref 4.0–10.5)
nRBC: 0 % (ref 0.0–0.2)

## 2019-05-24 LAB — COMPREHENSIVE METABOLIC PANEL
ALT: 16 U/L (ref 0–44)
AST: 22 U/L (ref 15–41)
Albumin: 3.8 g/dL (ref 3.5–5.0)
Alkaline Phosphatase: 40 U/L (ref 38–126)
Anion gap: 11 (ref 5–15)
BUN: 22 mg/dL (ref 8–23)
CO2: 25 mmol/L (ref 22–32)
Calcium: 9.4 mg/dL (ref 8.9–10.3)
Chloride: 102 mmol/L (ref 98–111)
Creatinine, Ser: 1.38 mg/dL — ABNORMAL HIGH (ref 0.61–1.24)
GFR calc Af Amer: >60 mL/min (ref >60)
GFR calc non Af Amer: 54 mL/min — ABNORMAL LOW (ref >60)
Glucose, Bld: 184 mg/dL — ABNORMAL HIGH (ref 70–99)
Potassium: 3.8 mmol/L (ref 3.5–5.1)
Sodium: 138 mmol/L (ref 135–145)
Total Bilirubin: 0.7 mg/dL (ref 0.3–1.2)
Total Protein: 7.5 g/dL (ref 6.5–8.1)

## 2019-05-24 LAB — TYPE AND SCREEN
ABO/RH(D): B POS
Antibody Screen: NEGATIVE

## 2019-05-24 LAB — LIPASE, BLOOD: Lipase: 48 U/L (ref 11–51)

## 2019-05-24 LAB — HEMOGLOBIN AND HEMATOCRIT, BLOOD
HCT: 29.4 % — ABNORMAL LOW (ref 39.0–52.0)
Hemoglobin: 8.9 g/dL — ABNORMAL LOW (ref 13.0–17.0)

## 2019-05-24 LAB — PROTIME-INR
INR: 1.2 (ref 0.8–1.2)
Prothrombin Time: 14.8 seconds (ref 11.4–15.2)

## 2019-05-24 MED ORDER — IOHEXOL 350 MG/ML SOLN
125.0000 mL | Freq: Once | INTRAVENOUS | Status: AC | PRN
  Administered 2019-05-24: 125 mL via INTRAVENOUS
  Filled 2019-05-24: qty 125

## 2019-05-24 NOTE — ED Provider Notes (Signed)
[facility] Emergency Department Provider Note  ____________________________________________   First MD Initiated Contact with Patient 05/24/19 0510     (approximate)  I have reviewed the triage vital signs and the nursing notes.   HISTORY  Chief Complaint Abdominal Pain   HPI Shane Bean is a 64 y.o. male with below list of previous medical conditions including recent colonoscopy that was performed on Tuesday with polypectomies performed at that time presents to the emergency department secondary to painless bright red blood per rectum which patient states that he noted tonight.  Patient states that he had 2 episodes of diarrheal stool with bright red blood noted and subsequently 1 episode of formed stool with bright red blood noted again.  Patient admits to 5 out of 10 left-sided abdominal discomfort.  Patient denies any nausea or vomiting.  Patient denies any fever.    Past Medical History:  Diagnosis Date  . Arthritis   . Ascending aortic dissection ([place])   . Diabetes mellitus without complication ([place])   . DVT (deep venous thrombosis) (HCC)    legs and lungs   . Hyperlipemia   . Hypertension   . Kidney stones     Patient Active Problem List   Diagnosis Date Noted  . Personal history of colon cancer   . Hydrocele 02/26/2018  . Colon polyp 08/17/2017  . Anemia   . Polyp of sigmoid colon   . Benign neoplasm of descending colon   . Colon neoplasm   . Abdominal pain, epigastric   . Gastritis without bleeding   . Nephrolithiasis 11/06/2015  . Cough with sputum 01/14/2015  . Fatigue 01/14/2015  . Obesity 01/14/2015  . GERD (gastroesophageal reflux disease) 04/24/2014  . Long term current use of anticoagulant therapy 04/24/2014  . Type 2 diabetes mellitus with complication ([place]) [id number]  . Ascending aortic dissection ([place]) 10/04/2013  . HTN (hypertension) 05/28/2013  . Pulmonary emboli ([place]) 06/26/2012  . Pulmonary edema 06/23/2012   . Respiratory failure with hypoxia ([place]) 06/23/2012  . Osteoarthritis of left hip 04/19/2012  . Right leg DVT ([place]) 06/17/2011    Past Surgical History:  Procedure Laterality Date  . CARDIAC SURGERY    . COLONOSCOPY WITH PROPOFOL N/A 04/04/2017   Procedure: COLONOSCOPY WITH PROPOFOL;  Surgeon: Lucilla Lame, MD;  Location: [facility] ENDOSCOPY;  Service: Endoscopy;  Laterality: N/A;  . COLONOSCOPY WITH PROPOFOL N/A 05/21/2019   Procedure: COLONOSCOPY WITH PROPOFOL;  Surgeon: Lucilla Lame, MD;  Location: [facility] ENDOSCOPY;  Service: Endoscopy;  Laterality: N/A;  . DENTAL SURGERY    . ESOPHAGOGASTRODUODENOSCOPY (EGD) WITH PROPOFOL N/A 04/04/2017   Procedure: ESOPHAGOGASTRODUODENOSCOPY (EGD) WITH PROPOFOL;  Surgeon: Lucilla Lame, MD;  Location: [facility] ENDOSCOPY;  Service: Endoscopy;  Laterality: N/A;  . LAPAROSCOPIC RIGHT HEMI COLECTOMY Right 08/17/2017   Procedure: LAPAROSCOPIC  RIGHT HEMI COLECTOMY, COLONOSCOPY, UMBILICAL HERNIA REPAIR;  Surgeon: Ileana Roup, MD;  Location: [facility] ORS;  Service: General;  Laterality: Right;  . REPAIR THORACIC AORTA      Prior to Admission medications   Medication Sig Start Date End Date Taking? Authorizing Provider  allopurinol (ZYLOPRIM) 100 MG tablet Take 100 mg by mouth daily at 3 pm.    [provider]  amLODipine (NORVASC) 10 MG tablet Take 10 mg by mouth at bedtime.    [provider]  aspirin EC 325 MG tablet Take 162.5 mg by mouth daily.  10/15/13   [provider]  doxazosin (CARDURA) 8 MG tablet Take 8 mg by mouth  at bedtime.     [provider]  glipiZIDE (GLUCOTROL) 5 MG tablet Take 10 mg by mouth 2 (two) times daily.    [provider]  HYDROcodone-acetaminophen (NORCO) 5-325 MG tablet Take 1 tablet by mouth every 4 (four) hours as needed for moderate pain. 02/28/18   Dustin Flock, MD  metoprolol tartrate (LOPRESSOR) 100 MG tablet Take 100 mg by mouth 2 (two) times daily.    [provider]   ranitidine (ZANTAC) 150 MG capsule Take 150 mg by mouth daily at 3 pm.  10/15/13   [provider]  simvastatin (ZOCOR) 10 MG tablet Take 10 mg by mouth every evening.    [provider]  spironolactone (ALDACTONE) 25 MG tablet Take 25 mg by mouth at bedtime.    [provider]  warfarin (COUMADIN) 6 MG tablet Take 6 mg by mouth every evening.    [provider]    Allergies Chlorthalidone, Hydralazine, and Lisinopril  No family history on file.  Social History Social History   Tobacco Use  . Smoking status: Never Smoker  . Smokeless tobacco: Never Used  Substance Use Topics  . Alcohol use: Yes    Alcohol/week: 28.0 standard drinks    Types: 28 Cans of beer per week    Comment: none olast 24hrs  . Drug use: No    Review of Systems Constitutional: No fever/chills Eyes: No visual changes. ENT: No sore throat. Cardiovascular: Denies chest pain. Respiratory: Denies shortness of breath. Gastrointestinal: Positive for abdominal pain.  No nausea, no vomiting.  No diarrhea.  No constipation.  Positive for bright red blood per rectum Genitourinary: Negative for dysuria. Musculoskeletal: Negative for neck pain.  Negative for back pain. Integumentary: Negative for rash. Neurological: Negative for headaches, focal weakness or numbness.   ____________________________________________   PHYSICAL EXAM:  VITAL SIGNS: ED Triage Vitals [05/24/19 0324]  Enc Vitals Group     BP 126/66     Pulse Rate 78     Resp 20     Temp 98.2 F (36.8 C)     Temp Source Oral     SpO2 92 %     Weight 131.1 kg (289 lb)     Height 1.778 m (5\' 10" )     Head Circumference      Peak Flow      Pain Score 5     Pain Loc      Pain Edu?      Excl. in [name]?     Constitutional: Alert and oriented.  Eyes: Conjunctivae are normal.  Mouth/Throat: Patient is wearing a mask. Neck: No stridor.  No meningeal signs.   Cardiovascular: Normal rate, regular rhythm. Good  peripheral circulation. Grossly normal heart sounds. Respiratory: Normal respiratory effort.  No retractions. Gastrointestinal: Soft and nontender. No distention.  Musculoskeletal: No lower extremity tenderness nor edema. No gross deformities of extremities. Neurologic:  Normal speech and language. No gross focal neurologic deficits are appreciated.  Skin:  Skin is warm, dry and intact. Psychiatric: Mood and affect are normal. Speech and behavior are normal.  ____________________________________________   LABS (all labs ordered are listed, but only abnormal results are displayed)  Labs Reviewed  CBC WITH DIFFERENTIAL/PLATELET - Abnormal; Notable for the following components:      Result Value   Hemoglobin 9.0 (*)    HCT 31.2 (*)    MCV 72.2 (*)    MCH 20.8 (*)    MCHC 28.8 (*)    RDW 20.6 (*)  All other components within normal limits  COMPREHENSIVE METABOLIC PANEL - Abnormal; Notable for the following components:   Glucose, Bld 184 (*)    Creatinine, Ser 1.38 (*)    GFR calc non Af Amer 54 (*)    All other components within normal limits  URINALYSIS, COMPLETE (UACMP) WITH MICROSCOPIC - Abnormal; Notable for the following components:   Color, Urine YELLOW (*)    APPearance HAZY (*)    Ketones, ur 5 (*)    Leukocytes,Ua SMALL (*)    All other components within normal limits  LIPASE, BLOOD  TYPE AND SCREEN   ____________________________________________  EKG    RADIOLOGY I, Hansen N Jesyka Slaght, personally viewed and evaluated these images (plain radiographs) as part of my medical decision making, as well as reviewing the written report by the radiologist.  ED MD interpretation: No acute findings no visible intraluminal hemorrhage at the site of the recent colonic polypectomy.  Chronic thoracoabdominal aortic dissection.  Official radiology report(s): CT Angio Abd/Pel w/ and/or w/o  Result Date: 05/24/2019 CLINICAL DATA:  GI bleeding after colonoscopy and polypectomy.  EXAM: CTA ABDOMEN AND PELVIS WITHOUT AND WITH CONTRAST TECHNIQUE: Multidetector CT imaging of the abdomen and pelvis was performed using the standard protocol during bolus administration of intravenous contrast. Multiplanar reconstructed images and MIPs were obtained and reviewed to evaluate the vascular anatomy. CONTRAST:  144mL OMNIPAQUE IOHEXOL 350 MG/ML SOLN COMPARISON:  02/22/2018 FINDINGS: VASCULAR Aorta: Dissection with flap roughly dividing the lumen. Flap continues from the lower chest, where it is incompletely covered, to the aortic terminus. There was chest CTA 02/16/2017. No aneurysm. Celiac: Arises from the true lumen-see chest CTA from 2019. No branch occlusion or aneurysm is seen. SMA: Arises from the true lumen. Calcified plaque at the ostium. No visible extravasation into the postoperative colonic lumen. Renals: The right vessel originates from the true lumen and may have been previously compromised as there is right renal atrophy. On prior chest CTA of the associated lumen enhances early. IMA: Patent and arising from the true lumen. Inflow: Mild atheromatous changes.  No visible stenosis or aneurysm Proximal Outflow: Mild atheromatous changes. No visible stenosis or aneurysm Veins: No venous phase occlusion or bleeding seen. Review of the MIP images confirms the above findings. NON-VASCULAR Lower chest:  No contributory findings. Hepatobiliary: Subtle hemangioma in the caudate lobe as seen by MRI in 2019.no evidence of biliary obstruction or stone. Pancreas: Unremarkable. Spleen: Unremarkable. Adrenals/Urinary Tract: Negative adrenals. No hydronephrosis or stone. Asymmetric right renal atrophy. Cystic densities in the right kidney without worrisome enhancement or convincing change from 2019 abdominal MRI. Prominent bladder wall thickness, but incompletely distended. Stomach/Bowel: Right hemicolectomy. Clips within the proximal transverse colon correlating with recent polypectomy. No intraluminal  hemorrhage or swelling is seen. Colonic diverticulosis. No small bowel obstruction. Lymphatic: No mass or adenopathy. Reproductive:No pathologic findings. Other: No ascites or pneumoperitoneum. Musculoskeletal: Prominent spondylosis. No acute or aggressive finding Other: Suboptimal arterial phase due to injector difficulties. IMPRESSION: 1. No acute finding. No visible intraluminal hemorrhage at the site of recent colonic polypectomy. 2. Chronic thoracoabdominal aortic dissection. 3. Colonic diverticulosis.  Right hemicolectomy. Electronically Signed   By: Monte Fantasia M.D.   On: 05/24/2019 06:17      Procedures   ____________________________________________   INITIAL IMPRESSION / MDM / [place] / ED COURSE  As part of my medical decision making, I reviewed the following data within the electronic MEDICAL RECORD NUMBER  64 year old male presented with above-stated history and physical  exam secondary to painless rectal bleeding.  Differential diagnosis including but not limited to bleeding secondary to recent polypectomy, diverticulosis diverticulitis colitis.  Laboratory data revealed a hemoglobin of 9.0 which is consistent with the patient's hemoglobin from February 26 of 2020.  Patient admits to known history of anemia.  CT scan revealed no active hemorrhage from polypectomy site as well as chronic thoracoabdominal aortic aneurysm which is consistent with scan that was performed at [name] on 12/04/2018.  We will obtain a repeat H&H on the patient and if stable patient will be cleared for discharge home with outpatient follow-up with Dr. Verl Blalock gastroenterology. ____________________________________________  FINAL CLINICAL IMPRESSION(S) / ED DIAGNOSES  Final diagnoses:  Gastrointestinal hemorrhage, unspecified gastrointestinal hemorrhage type     MEDICATIONS GIVEN DURING THIS VISIT:  Medications  iohexol (OMNIPAQUE) 350 MG/ML injection 125 mL (125 mLs Intravenous  Contrast Given 05/24/19 0547)     ED Discharge Orders    None      *Please note:  Shane Bean was evaluated in Emergency Department on 05/24/2019 for the symptoms described in the history of present illness. He was evaluated in the context of the global COVID-19 pandemic, which necessitated consideration that the patient might be at risk for infection with the SARS-CoV-2 virus that causes COVID-19. Institutional protocols and algorithms that pertain to the evaluation of patients at risk for COVID-19 are in a state of rapid change based on information released by regulatory bodies including the CDC and federal and state organizations. These policies and algorithms were followed during the patient's care in the ED.  Some ED evaluations and interventions may be delayed as a result of limited staffing during the pandemic.*  Note:  This document was prepared using Dragon voice recognition software and may include unintentional dictation errors.   Gregor Hams, MD 05/29/19 [phone]

## 2019-05-24 NOTE — ED Notes (Signed)
Pt to CT scan.

## 2019-05-24 NOTE — ED Notes (Signed)
Pt states had colonoscopy Tuesday and had some polyps clipped, states yesterday he started having some bloody stools. States has had 2 episodes of bloody diarrhea, no vomiting. Does co nausea, denies any abd pain at this time.

## 2019-05-24 NOTE — ED Triage Notes (Addendum)
Patient ambulatory to triage with steady gait, without difficulty or distress noted, mask in place; pt reports colonoscopy on Tuesday with polyp removal; tonight noted some generalized abd discomfort and rectal bleeding with diarrheal stool x 2

## 2019-05-27 NOTE — Anesthesia Postprocedure Evaluation (Signed)
Anesthesia Post Note  Patient: Shane Bean  Procedure(s) Performed: COLONOSCOPY WITH PROPOFOL (N/A )  Patient location during evaluation: PACU Anesthesia Type: General Level of consciousness: awake and alert Pain management: pain level controlled Vital Signs Assessment: post-procedure vital signs reviewed and stable Respiratory status: spontaneous breathing, nonlabored ventilation, respiratory function stable and patient connected to nasal cannula oxygen Cardiovascular status: blood pressure returned to baseline and stable Postop Assessment: no apparent nausea or vomiting Anesthetic complications: no     Last Vitals:  Vitals:   05/21/19 1110 05/21/19 1120  BP: 114/68 128/74  Pulse: 85 71  Resp: 18 18  Temp:    SpO2: 97% 97%    Last Pain:  Vitals:   05/21/19 1040  TempSrc: Temporal  PainSc:                  Molli Barrows

## 2019-06-27 IMAGING — MR MR ABDOMEN WO/W CM
11 of 17 series · 22 of 48 positions shown · IV contrast (20mL Multihance)
Comparison: None.

CLINICAL DATA: 61-year-old male with history of abnormal
ultrasound, with indeterminate liver lesion in the caudate lobe,
presenting with bloating and trouble breathing.

EXAM:
MRI ABDOMEN WITHOUT AND WITH CONTRAST
TECHNIQUE: Multiplanar multisequence MR imaging of the abdomen was performed
both before and after the administration of intravenous contrast.
CONTRAST:  20mL MULTIHANCE GADOBENATE DIMEGLUMINE 529 MG/ML IV SOLN

[Series 3: T2 · coronal · 8.0mm · 0.78mm/px · 1 of 30 slices shown (1 of 2)]
[im 1/30]
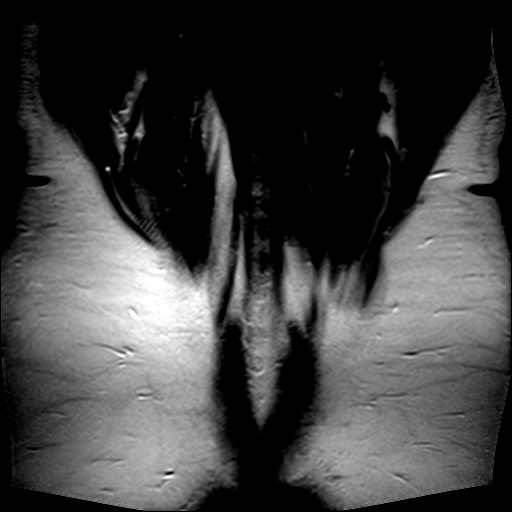

[Series 4: T2 fat-sat · axial · 8.0mm · 0.78mm/px · 1 of 28 slices shown]
[im 1/28]
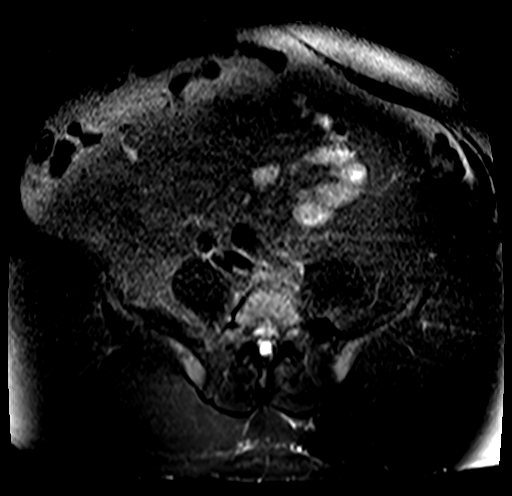

[Series 5: T1 · axial · 8.0mm · 0.78mm/px · 1 of 56 slices shown]
[im 1/56]
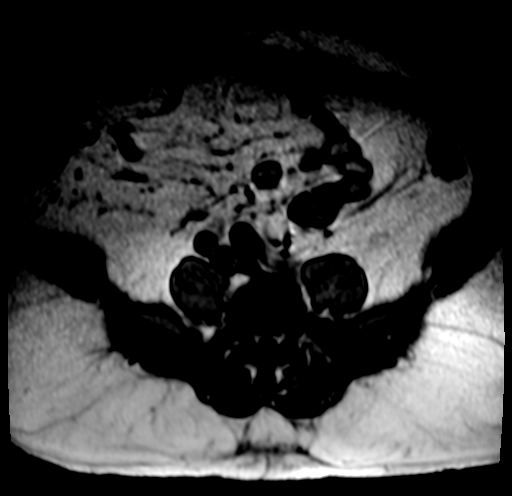

[Series 6: T2 · axial · 8.0mm · 0.78mm/px · 1 of 28 slices shown (2 of 2)]
[im 1/28]
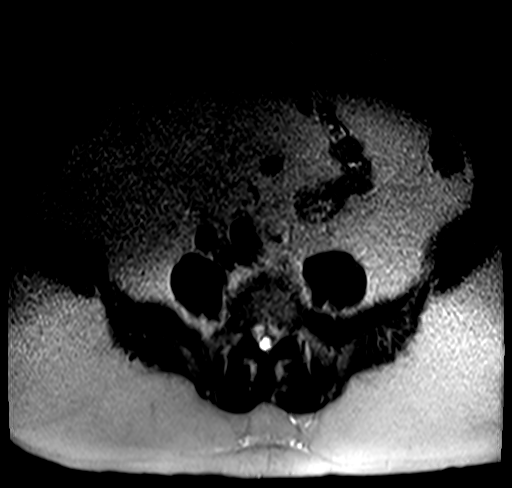

[Series 7: ax fisp · axial · 4.0mm · 0.78mm/px · 1 of 65 slices shown]
[im 1/65]
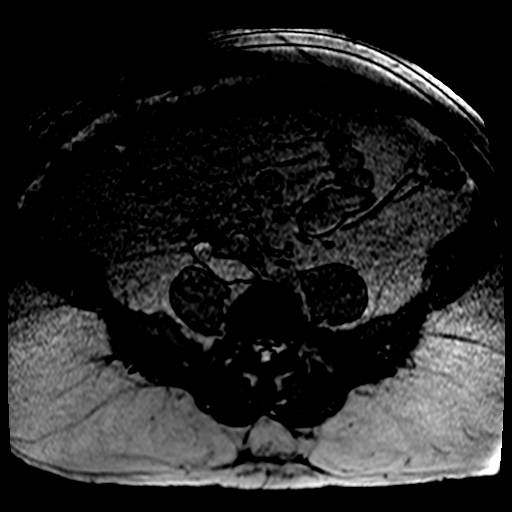

[Series 8: DWI · axial · 6.0mm · 2.08mm/px · z∈[-143,+137]mm · 3 of 120 slices shown]
[im 1/120]
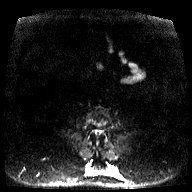
[im 60/120]
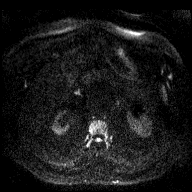
[im 120/120]
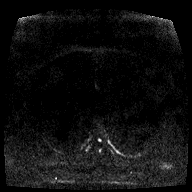

[Series 9: ax dwi_adc · axial · 6.0mm · 2.08mm/px · 1 of 40 slices shown]
[im 1/40]
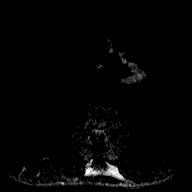

[Series 10: T1 dynamic fat-sat · axial · non-contrast · 2.5mm · 0.82mm/px · z∈[-166,+92]mm · 3 of 104 slices shown (1 of 3)]
[im 1/104]
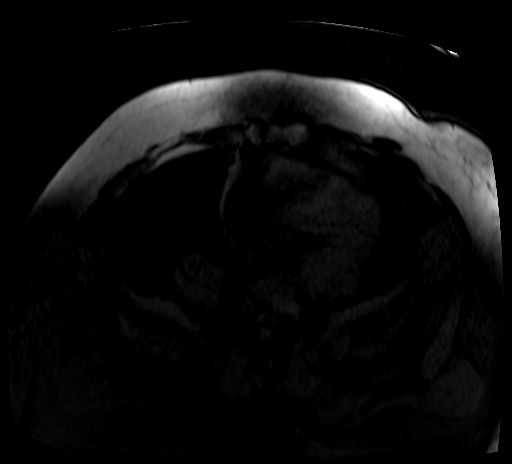
[im 52/104]
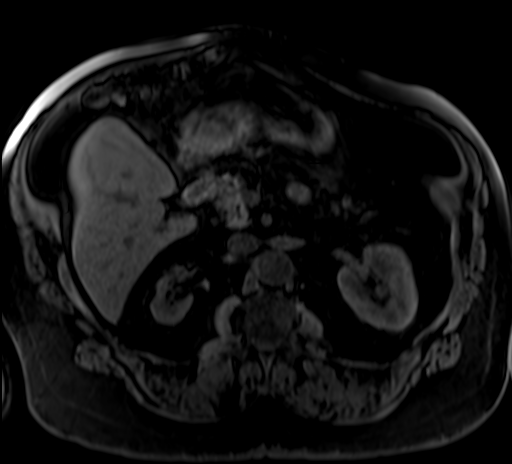
[im 104/104]
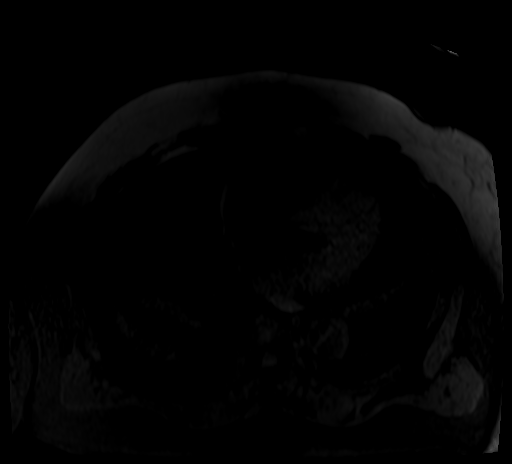

[Series 11: T1 dynamic fat-sat · axial · 2.5mm · 0.82mm/px · z∈[-166,+92]mm · 4 of 104 slices shown (2 of 3)]
[im 1/104]
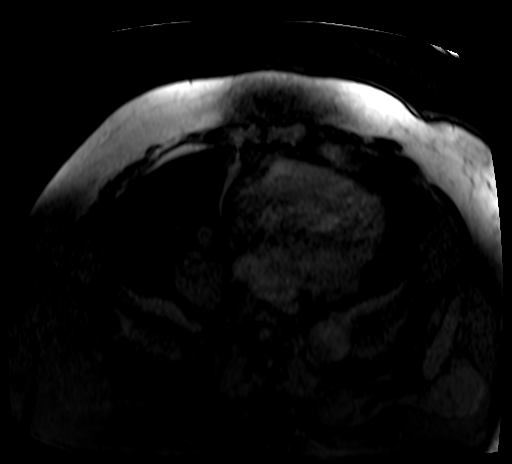
[im 35/104]
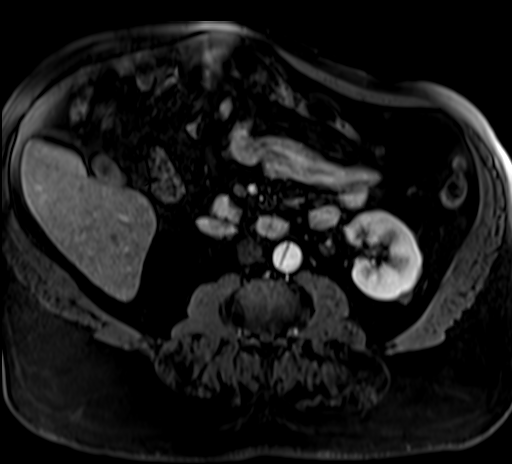
[im 69/104]
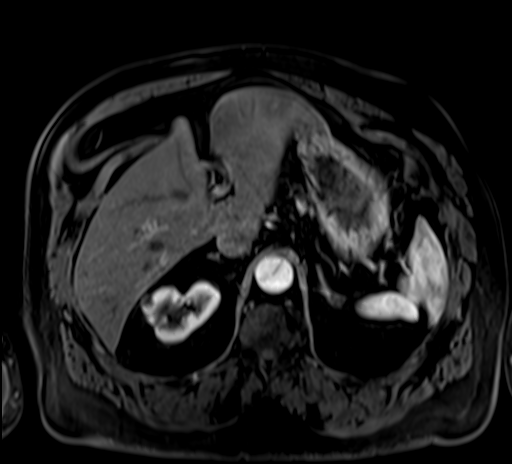
[im 104/104]
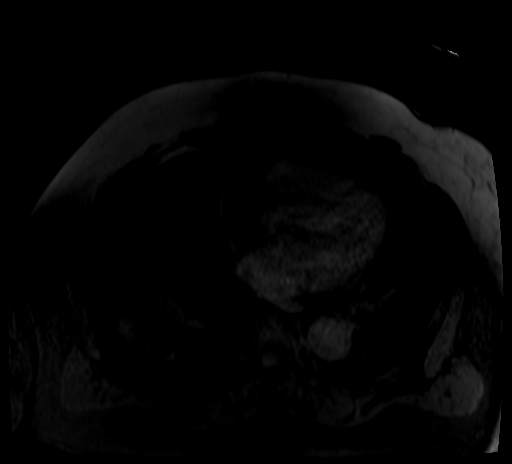

[Series 12: T1 dynamic fat-sat · axial · 2.5mm · 0.82mm/px · z∈[-166,+92]mm · 4 of 104 slices shown (3 of 3)]
[im 1/104]
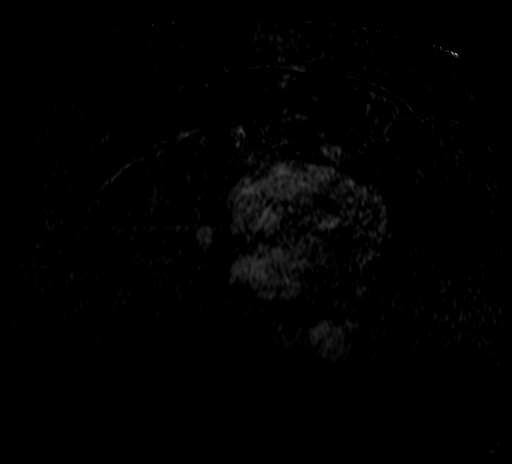
[im 35/104]
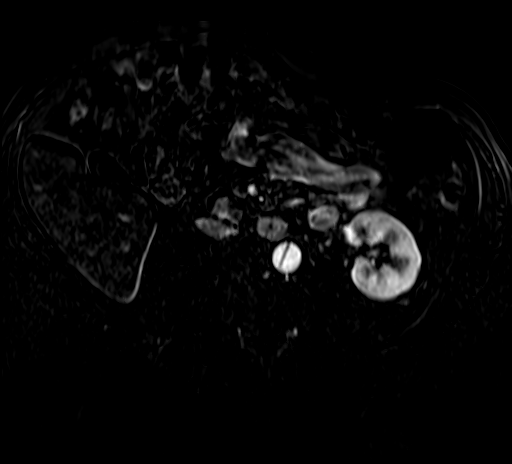
[im 69/104]
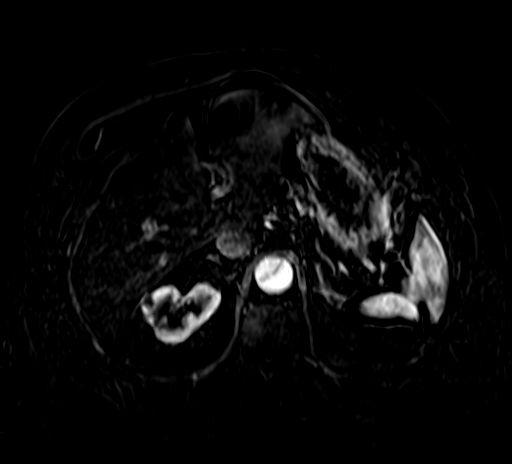
[im 104/104]
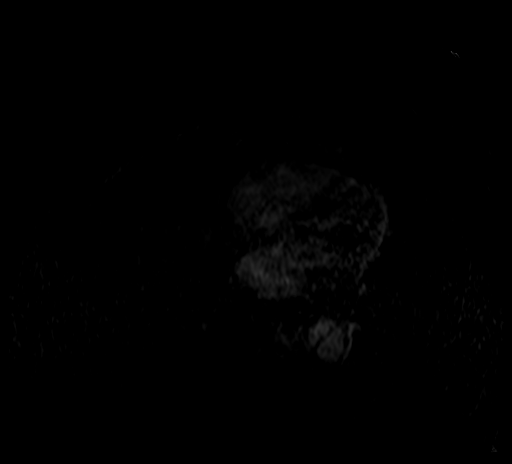

[Series 13: T1 dynamic fat-sat post-contrast · axial · 2.5mm · 0.82mm/px · z∈[-166,-81]mm · 2 of 104 slices shown]
[im 1/104]
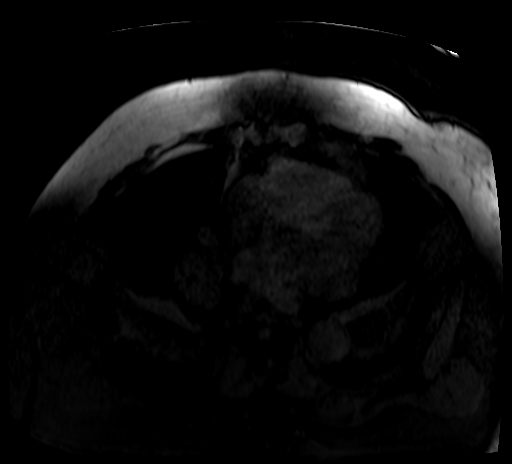
[im 35/104]
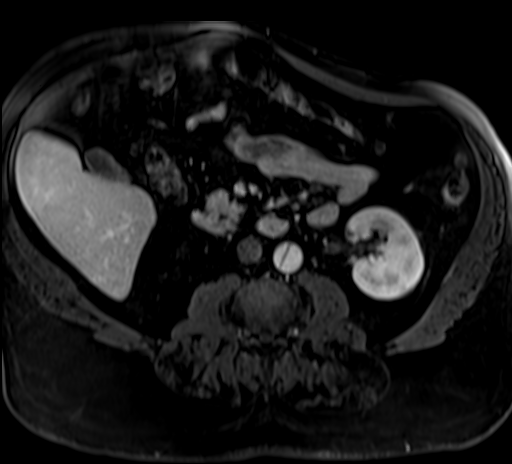

[22 of 48 positions shown; findings below may reference images not displayed]

FINDINGS: Lower chest: Susceptibility artifact in the sternum related to
sternal wires.

Hepatobiliary: Diffuse loss of signal intensity throughout the
hepatic parenchyma on out of phase dual echo images, indicative of a
background of hepatic steatosis. The lesion of concern in the
caudate lobe of the liver measures 1 cm (axial image 9 of series 4)
and is low T1 signal intensity, slightly high T2 signal intensity,
demonstrates peripheral nodular enhancement on arterial phase
imaging with progressive centripetal filling, diagnostic of a
cavernous hemangioma. A tiny 4 mm T1 hypointense, T2 hyperintense,
nonenhancing lesion in segment 5 of the liver is compatible with a
tiny cyst or biliary hamartoma. No other suspicious hepatic lesions
are noted. MRCP images demonstrate no intra or extrahepatic biliary
ductal dilatation. Gallbladder is normal in appearance.

Pancreas: No pancreatic mass. No pancreatic ductal dilatation. No
pancreatic or peripancreatic fluid or inflammatory changes.

Spleen:  Unremarkable.

Adrenals/Urinary Tract: Several areas of severe cortical thinning in
the right kidney, presumably scarring from prior infection or
infarction. Multiple well-defined T1 hypointense, T2 hyperintense,
nonenhancing lesions in the kidneys bilaterally are compatible with
simple cysts, largest of which measures 12 mm in the interpolar
region of the right kidney. No suspicious renal lesions. No
hydroureteronephrosis in the visualized portions of the abdomen.
Bilateral adrenal glands are normal in appearance.

Stomach/Bowel: Visualized portions are unremarkable.

Vascular/Lymphatic: Aortic atherosclerosis with dissection flap
extending from the distal descending thoracic aorta in to the distal
infrarenal abdominal aorta, likely extending into the left common
iliac artery (although assessment is limited by signal loss). The
dissection flap overrides the ostium of the celiac axis, however the
celiac axis is grossly patent. Renal artery origins arise from
separate lumens, but both appear patent. No lymphadenopathy noted in
the abdomen.

Other: No significant volume of ascites noted in the visualized
portions of the peritoneal cavity.

Musculoskeletal: No aggressive appearing osseous lesions are noted
in the visualized portions of the skeleton.
IMPRESSION: 1. The lesion of concern in the caudate lobe of the liver has
imaging characteristics compatible with a small cavernous
hemangioma. No suspicious hepatic lesions are noted.
2. Aortic atherosclerosis with probable chronic aortic dissection
involving at least the distal descending thoracic aorta and the
abdominal aorta, with likely extension into the left common iliac
artery. This is favored to be chronic based on displacement of
intimal calcification on prior CT the abdomen and pelvis 09/20/2014
which is evident in retrospect on axial image 27 of series 2 of that
examination. Both of the lumens of the aortic dissection are patent,
and although the dissection flap overrides the ostium of the celiac
axis, the celiac axis is patent at this time. There is extensive
scarring in the right kidney which is likely sequela of prior
infarctions. Followup nonemergent evaluation with CTA chest, abdomen
and pelvis is recommended in the near future to better evaluate this
finding and to establish a baseline for future followup
examinations.
3. Mild hepatic steatosis.

These results will be called to the ordering clinician or
representative by the Radiologist Assistant, and communication
documented in the PACS or zVision Dashboard.

Aortic Atherosclerosis ([id number]).

## 2019-11-01 ENCOUNTER — Other Ambulatory Visit: Payer: Self-pay | Admitting: Internal Medicine

## 2019-11-01 ENCOUNTER — Ambulatory Visit: Payer: Medicare PPO | Attending: Internal Medicine

## 2019-11-01 DIAGNOSIS — Z23 Encounter for immunization: Secondary | ICD-10-CM

## 2019-11-01 NOTE — Progress Notes (Signed)
   Covid-19 Vaccination Clinic  Name:  DENNIES COATE    MRN: [id number] DOB: November 13, 1955  11/01/2019  Mr. Haider was observed post Covid-19 immunization for 15 minutes without incident. He was provided with Vaccine Information Sheet and instruction to access the V-Safe system.   Mr. Rio was instructed to call 911 with any severe reactions post vaccine: Marland Kitchen Difficulty breathing  . Swelling of face and throat  . A fast heartbeat  . A bad rash all over body  . Dizziness and weakness

## 2019-11-01 NOTE — Progress Notes (Signed)
   Covid-19 Vaccination Clinic  Name:  DEWIE AHART    MRN: [id number] DOB: 1955-03-24  11/01/2019  Mr. Bahri was observed post Covid-19 immunization for 15 minutes without incident. He was provided with Vaccine Information Sheet and instruction to access the V-Safe system.   Mr. Macera was instructed to call 911 with any severe reactions post vaccine: Marland Kitchen Difficulty breathing  . Swelling of face and throat  . A fast heartbeat  . A bad rash all over body  . Dizziness and weakness

## 2020-04-29 ENCOUNTER — Emergency Department
Admission: EM | Admit: 2020-04-29 | Discharge: 2020-04-29 | Disposition: A | Discharge status: Home / Self Care | Payer: Medicare PPO | Attending: Emergency Medicine | Admitting: Emergency Medicine

## 2020-04-29 ENCOUNTER — Emergency Department: Payer: Medicare PPO

## 2020-04-29 ENCOUNTER — Other Ambulatory Visit: Payer: Self-pay

## 2020-04-29 DIAGNOSIS — Z7982 Long term (current) use of aspirin: Secondary | ICD-10-CM | POA: Insufficient documentation

## 2020-04-29 DIAGNOSIS — M79605 Pain in left leg: Secondary | ICD-10-CM

## 2020-04-29 DIAGNOSIS — Z7901 Long term (current) use of anticoagulants: Secondary | ICD-10-CM | POA: Insufficient documentation

## 2020-04-29 DIAGNOSIS — I10 Essential (primary) hypertension: Secondary | ICD-10-CM | POA: Insufficient documentation

## 2020-04-29 DIAGNOSIS — Z79899 Other long term (current) drug therapy: Secondary | ICD-10-CM | POA: Diagnosis not present

## 2020-04-29 DIAGNOSIS — E119 Type 2 diabetes mellitus without complications: Secondary | ICD-10-CM | POA: Diagnosis not present

## 2020-04-29 DIAGNOSIS — Z7984 Long term (current) use of oral hypoglycemic drugs: Secondary | ICD-10-CM | POA: Diagnosis not present

## 2020-04-29 DIAGNOSIS — L98491 Non-pressure chronic ulcer of skin of other sites limited to breakdown of skin: Secondary | ICD-10-CM | POA: Diagnosis not present

## 2020-04-29 LAB — CBC WITH DIFFERENTIAL/PLATELET
Abs Immature Granulocytes: 0.02 10*3/uL (ref 0.00–0.07)
Basophils Absolute: 0 10*3/uL (ref 0.0–0.1)
Basophils Relative: 0 %
Eosinophils Absolute: 0.1 10*3/uL (ref 0.0–0.5)
Eosinophils Relative: 3 %
HCT: 32.7 % — ABNORMAL LOW (ref 39.0–52.0)
Hemoglobin: 10.1 g/dL — ABNORMAL LOW (ref 13.0–17.0)
Immature Granulocytes: 0 %
Lymphocytes Relative: 18 %
Lymphs Abs: 1 10*3/uL (ref 0.7–4.0)
MCH: 22.7 pg — ABNORMAL LOW (ref 26.0–34.0)
MCHC: 30.9 g/dL (ref 30.0–36.0)
MCV: 73.6 fL — ABNORMAL LOW (ref 80.0–100.0)
Monocytes Absolute: 0.4 10*3/uL (ref 0.1–1.0)
Monocytes Relative: 8 %
Neutro Abs: 3.8 10*3/uL (ref 1.7–7.7)
Neutrophils Relative %: 71 %
Platelets: 198 10*3/uL (ref 150–400)
RBC: 4.44 MIL/uL (ref 4.22–5.81)
RDW: 17.9 % — ABNORMAL HIGH (ref 11.5–15.5)
WBC: 5.4 10*3/uL (ref 4.0–10.5)
nRBC: 0 % (ref 0.0–0.2)

## 2020-04-29 LAB — COMPREHENSIVE METABOLIC PANEL
ALT: 13 U/L (ref 0–44)
AST: 25 U/L (ref 15–41)
Albumin: 3.5 g/dL (ref 3.5–5.0)
Alkaline Phosphatase: 40 U/L (ref 38–126)
Anion gap: 10 (ref 5–15)
BUN: 10 mg/dL (ref 8–23)
CO2: 23 mmol/L (ref 22–32)
Calcium: 8.8 mg/dL — ABNORMAL LOW (ref 8.9–10.3)
Chloride: 101 mmol/L (ref 98–111)
Creatinine, Ser: 1.05 mg/dL (ref 0.61–1.24)
GFR, Estimated: >60 mL/min (ref >60)
Glucose, Bld: 219 mg/dL — ABNORMAL HIGH (ref 70–99)
Potassium: 3.4 mmol/L — ABNORMAL LOW (ref 3.5–5.1)
Sodium: 134 mmol/L — ABNORMAL LOW (ref 135–145)
Total Bilirubin: 1.2 mg/dL (ref 0.3–1.2)
Total Protein: 7 g/dL (ref 6.5–8.1)

## 2020-04-29 MED ORDER — CEPHALEXIN 500 MG PO CAPS: 500.0000 mg | ORAL_CAPSULE | Freq: Three times a day (TID) | ORAL | 0 refills | Status: DC

## 2020-04-29 NOTE — Discharge Instructions (Addendum)
Keep your appointment with your doctor on May 3.  Keep the area clean and dry and watch for any signs of drainage or increased redness.  Return to the emergency department if you begin running fever over 101.  Also begin watching your sugar intake to help control your diabetes.  Elevate your leg often to reduce any swelling that may occur during the day when you are up walking.  The antibiotic was sent to your pharmacy.  Begin taking as directed and continue until you have completely finished this.  You may take Tylenol if needed for pain sparingly.

## 2020-04-29 NOTE — ED Notes (Signed)
Ultrasound tech at bedside Labs obtained and sent to lab.

## 2020-04-29 NOTE — ED Notes (Signed)
Restore dressing applied

## 2020-04-29 NOTE — ED Provider Notes (Signed)
[facility] Emergency Department Provider Note  ____________________________________________   Event Date/Time   First MD Initiated Contact with Patient 04/29/20 1023     (approximate)  I have reviewed the triage vital signs and the nursing notes.   HISTORY  Chief Complaint Skin Ulcer   HPI Shane Bean is a 65 y.o. male presents to the ED with complaint of ulcer to the posterior left lower leg that has been going on for "months".  Patient states he thought it was healing and has an appointment to see his PCP on 05/05/2020.  He states that during the night pain is worse and "sharp".  Patient states the pain is relieved with elevation and is increased with weightbearing and walking.  Patient denies any fever, chills or known drainage from the area.  Patient is type II diabetic and states that his blood sugars usually run around the 120s.  Currently patient rates his pain as a 0/10.         Past Medical History:  Diagnosis Date  . Arthritis   . Ascending aortic dissection ([place])   . Diabetes mellitus without complication ([place])   . DVT (deep venous thrombosis) ([facility])    legs and lungs   . Hyperlipemia   . Hypertension   . Kidney stones     Patient Active Problem List   Diagnosis Date Noted  . Personal history of colon cancer   . Hydrocele 02/26/2018  . Colon polyp 08/17/2017  . Anemia   . Polyp of sigmoid colon   . Benign neoplasm of descending colon   . Colon neoplasm   . Abdominal pain, epigastric   . Gastritis without bleeding   . Nephrolithiasis 11/06/2015  . Cough with sputum 01/14/2015  . Fatigue 01/14/2015  . Obesity 01/14/2015  . GERD (gastroesophageal reflux disease) 04/24/2014  . Long term current use of anticoagulant therapy 04/24/2014  . Type 2 diabetes mellitus with complication ([place]) [id number]  . Ascending aortic dissection ([place]) 10/04/2013  . HTN (hypertension) 05/28/2013  . Pulmonary emboli ([place]) 06/26/2012  . Pulmonary  edema 06/23/2012  . Respiratory failure with hypoxia ([place]) 06/23/2012  . Osteoarthritis of left hip 04/19/2012  . Right leg DVT ([place]) 06/17/2011    Past Surgical History:  Procedure Laterality Date  . CARDIAC SURGERY    . COLONOSCOPY WITH PROPOFOL N/A 04/04/2017   Procedure: COLONOSCOPY WITH PROPOFOL;  Surgeon: Lucilla Lame, MD;  Location: [facility] ENDOSCOPY;  Service: Endoscopy;  Laterality: N/A;  . COLONOSCOPY WITH PROPOFOL N/A 05/21/2019   Procedure: COLONOSCOPY WITH PROPOFOL;  Surgeon: Lucilla Lame, MD;  Location: [facility] ENDOSCOPY;  Service: Endoscopy;  Laterality: N/A;  . DENTAL SURGERY    . ESOPHAGOGASTRODUODENOSCOPY (EGD) WITH PROPOFOL N/A 04/04/2017   Procedure: ESOPHAGOGASTRODUODENOSCOPY (EGD) WITH PROPOFOL;  Surgeon: Lucilla Lame, MD;  Location: [facility] ENDOSCOPY;  Service: Endoscopy;  Laterality: N/A;  . LAPAROSCOPIC RIGHT HEMI COLECTOMY Right 08/17/2017   Procedure: LAPAROSCOPIC  RIGHT HEMI COLECTOMY, COLONOSCOPY, UMBILICAL HERNIA REPAIR;  Surgeon: Ileana Roup, MD;  Location: [facility] ORS;  Service: General;  Laterality: Right;  . REPAIR THORACIC AORTA      Prior to Admission medications   Medication Sig Start Date End Date Taking? Authorizing Provider  cephALEXin (KEFLEX) 500 MG capsule Take 1 capsule (500 mg total) by mouth 3 (three) times daily. 04/29/20  Yes Johnn Hai, PA-C  allopurinol (ZYLOPRIM) 100 MG tablet Take 100 mg by mouth daily at 3 pm.    [provider]  amLODipine ([place])  10 MG tablet Take 10 mg by mouth at bedtime.    [provider]  aspirin EC 325 MG tablet Take 162.5 mg by mouth daily.  10/15/13   [provider]  COVID-19 mRNA vaccine, Moderna, 100 MCG/0.5ML injection USE AS DIRECTED 11/01/19 10/31/20  Carlyle Basques, MD  doxazosin (CARDURA) 8 MG tablet Take 8 mg by mouth at bedtime.     [provider]  glipiZIDE (GLUCOTROL) 5 MG tablet Take 10 mg by mouth 2 (two) times daily.    [provider]   HYDROcodone-acetaminophen (NORCO) 5-325 MG tablet Take 1 tablet by mouth every 4 (four) hours as needed for moderate pain. 02/28/18   Dustin Flock, MD  metoprolol tartrate (LOPRESSOR) 100 MG tablet Take 100 mg by mouth 2 (two) times daily.    [provider]  ranitidine (ZANTAC) 150 MG capsule Take 150 mg by mouth daily at 3 pm.  10/15/13   [provider]  simvastatin (ZOCOR) 10 MG tablet Take 10 mg by mouth every evening.    [provider]  spironolactone (ALDACTONE) 25 MG tablet Take 25 mg by mouth at bedtime.    [provider]  warfarin (COUMADIN) 6 MG tablet Take 6 mg by mouth every evening.    [provider]    Allergies Chlorthalidone, Hydralazine, and Lisinopril  No family history on file.  Social History Social History   Tobacco Use  . Smoking status: Never Smoker  . Smokeless tobacco: Never Used  Vaping Use  . Vaping Use: Never used  Substance Use Topics  . Alcohol use: Yes    Alcohol/week: 28.0 standard drinks    Types: 28 Cans of beer per week    Comment: none olast 24hrs  . Drug use: No    Review of Systems Constitutional: No fever/chills Eyes: No visual changes. ENT: No sore throat. Cardiovascular: Denies chest pain. Respiratory: Denies shortness of breath.  Negative for cough. Gastrointestinal: No abdominal pain.  No nausea, no vomiting.  Genitourinary: Negative for dysuria. Musculoskeletal: Positive for left leg pain. Skin: Positive for chronic skin ulcer to left leg. Neurological: Negative for headaches, focal weakness or numbness. Endocrine: Diabetes mellitus ____________________________________________   PHYSICAL EXAM:  VITAL SIGNS: ED Triage Vitals [04/29/20 1023]  Enc Vitals Group     BP 133/78     Pulse Rate 82     Resp 16     Temp 98.1 F (36.7 C)     Temp Source Oral     SpO2 97 %     Weight      Height      Head Circumference      Peak Flow      Pain Score 0     Pain Loc       Pain Edu?      Excl. in [place]?    Constitutional: Alert and oriented. Well appearing and in no acute distress. Eyes: Conjunctivae are normal.  Head: Atraumatic. Neck: No stridor.   Cardiovascular: Normal rate, regular rhythm. Grossly normal heart sounds.  Good peripheral circulation. Respiratory: Normal respiratory effort.  No retractions. Lungs CTAB. Gastrointestinal: Soft and nontender. No distention. Musculoskeletal: Examination of the left lower extremity there is no gross deformity however there is a superficial skin ulcer present in the lower one third tib-fib.  No active drainage is noted at this time.  There is no erythema present.  Area is mildly tender to palpation.  Mild soft tissue edema is noted lower extremity but in  comparison with the right is equal.  Patient has fungal nails present on his left foot.  Bevelyn Buckles' sign was negative.  Good muscle strength bilaterally.  No warmth or erythema is appreciated in the left lower extremity. Neurologic:  Normal speech and language. No gross focal neurologic deficits are appreciated.  Skin:  Skin is warm, dry.  Superficial skin ulcer measuring approximately 2 cm and shallow as discussed above. Psychiatric: Mood and affect are normal. Speech and behavior are normal.  ____________________________________________   LABS (all labs ordered are listed, but only abnormal results are displayed)  Labs Reviewed  CBC WITH DIFFERENTIAL/PLATELET - Abnormal; Notable for the following components:      Result Value   Hemoglobin 10.1 (*)    HCT 32.7 (*)    MCV 73.6 (*)    MCH 22.7 (*)    RDW 17.9 (*)    All other components within normal limits  COMPREHENSIVE METABOLIC PANEL - Abnormal; Notable for the following components:   Sodium 134 (*)    Potassium 3.4 (*)    Glucose, Bld 219 (*)    Calcium 8.8 (*)    All other components within normal limits   ____________________________________________   RADIOLOGY Leana Gamer, personally viewed  and evaluated these images (plain radiographs) as part of my medical decision making, as well as reviewing the written report by the radiologist.   Official radiology report(s): US Venous Img Lower Unilateral Left  Result Date: 04/29/2020 CLINICAL DATA:  Lower leg pain, cutaneous ulceration EXAM: Left LOWER EXTREMITY VENOUS DOPPLER ULTRASOUND TECHNIQUE: Gray-scale sonography with compression, as well as color and duplex ultrasound, were performed to evaluate the deep venous system(s) from the level of the common femoral vein through the popliteal and proximal calf veins. COMPARISON:  None. FINDINGS: VENOUS Normal compressibility of the common femoral, superficial femoral, and popliteal veins, as well as the visualized calf veins. Visualized portions of profunda femoral vein and great saphenous vein unremarkable. No filling defects to suggest DVT on grayscale or color Doppler imaging. Doppler waveforms show normal direction of venous flow, normal respiratory plasticity and response to augmentation. Limited views of the contralateral common femoral vein are unremarkable. OTHER None. Limitations: none IMPRESSION: No findings of left lower extremity DVT. Electronically Signed   By: Van Clines M.D.   On: 04/29/2020 11:32    ____________________________________________   PROCEDURES  Procedure(s) performed (including Critical Care):  Procedures   ____________________________________________   INITIAL IMPRESSION / ASSESSMENT AND PLAN / ED COURSE  As part of my medical decision making, I reviewed the following data within the electronic MEDICAL RECORD NUMBER Notes from prior ED visits and [place] Controlled Substance Database  65 year old male presents to the ED with concerns of a chronic skin ulcer posterior calf area lower one third that has been there for several months.  Patient states that for the last week it has been increasingly painful.  Patient states he has a PCP visit on 05/05/2020 but was  concerned that there was infection or a DVT.  Ultrasound was negative for DVT and white count was reassuring.  His hemoglobin was 10.1 which in looking back through his chart hemoglobin has been as low as 9 and seems to be chronic.  Patient denies any symptoms.  He was informed that his blood glucose was 219 and he agrees to pay closer attention to his diabetic diet.  Patient was started on a prescription of Keflex 500 mg 3 times daily for 7 days.  He is to  elevate his legs as needed for discomfort or swelling.  A restore dressing was applied to the skin ulcer until he is seen by his PCP.  ____________________________________________   FINAL CLINICAL IMPRESSION(S) / ED DIAGNOSES  Final diagnoses:  Leg pain, posterior, left  Chronic skin ulcer, limited to breakdown of skin [facility])     ED Discharge Orders         Ordered    cephALEXin (KEFLEX) 500 MG capsule  3 times daily        04/29/20 1212          *Please note:  Shane Bean was evaluated in Emergency Department on 04/29/2020 for the symptoms described in the history of present illness. He was evaluated in the context of the global COVID-19 pandemic, which necessitated consideration that the patient might be at risk for infection with the SARS-CoV-2 virus that causes COVID-19. Institutional protocols and algorithms that pertain to the evaluation of patients at risk for COVID-19 are in a state of rapid change based on information released by regulatory bodies including the CDC and federal and state organizations. These policies and algorithms were followed during the patient's care in the ED.  Some ED evaluations and interventions may be delayed as a result of limited staffing during and the pandemic.*   Note:  This document was prepared using Dragon voice recognition software and may include unintentional dictation errors.    Johnn Hai, PA-C 04/29/20 1611    Merlyn Lot, MD 04/29/20 [phone]

## 2020-04-29 NOTE — ED Triage Notes (Signed)
Pt to ED for ulcer to posterior LLE, states he has had the ulcer for "months" and "thought it was healing." appointment to see PCP on 5/3 however pain is worsening at night, described as "sharp." States pain is relieved or eased with elevation. AO x4, talking in full sentences with regular and unlabored breathing.

## 2020-05-10 ENCOUNTER — Encounter: Payer: Self-pay | Admitting: Physician Assistant

## 2020-05-10 ENCOUNTER — Other Ambulatory Visit: Payer: Self-pay

## 2020-05-10 ENCOUNTER — Emergency Department
Admission: EM | Admit: 2020-05-10 | Discharge: 2020-05-10 | Disposition: A | Discharge status: Home / Self Care | Payer: Medicare (Managed Care) | Attending: Emergency Medicine | Admitting: Emergency Medicine

## 2020-05-10 DIAGNOSIS — S81809D Unspecified open wound, unspecified lower leg, subsequent encounter: Secondary | ICD-10-CM

## 2020-05-10 DIAGNOSIS — Z7984 Long term (current) use of oral hypoglycemic drugs: Secondary | ICD-10-CM | POA: Diagnosis not present

## 2020-05-10 DIAGNOSIS — Z79899 Other long term (current) drug therapy: Secondary | ICD-10-CM | POA: Diagnosis not present

## 2020-05-10 DIAGNOSIS — Z7901 Long term (current) use of anticoagulants: Secondary | ICD-10-CM | POA: Diagnosis not present

## 2020-05-10 DIAGNOSIS — E119 Type 2 diabetes mellitus without complications: Secondary | ICD-10-CM | POA: Diagnosis not present

## 2020-05-10 DIAGNOSIS — X58XXXD Exposure to other specified factors, subsequent encounter: Secondary | ICD-10-CM | POA: Insufficient documentation

## 2020-05-10 DIAGNOSIS — Z7982 Long term (current) use of aspirin: Secondary | ICD-10-CM | POA: Insufficient documentation

## 2020-05-10 DIAGNOSIS — Z86718 Personal history of other venous thrombosis and embolism: Secondary | ICD-10-CM | POA: Insufficient documentation

## 2020-05-10 DIAGNOSIS — S81802D Unspecified open wound, left lower leg, subsequent encounter: Secondary | ICD-10-CM | POA: Diagnosis not present

## 2020-05-10 DIAGNOSIS — Z85038 Personal history of other malignant neoplasm of large intestine: Secondary | ICD-10-CM | POA: Insufficient documentation

## 2020-05-10 DIAGNOSIS — S8992XD Unspecified injury of left lower leg, subsequent encounter: Secondary | ICD-10-CM | POA: Diagnosis present

## 2020-05-10 DIAGNOSIS — I10 Essential (primary) hypertension: Secondary | ICD-10-CM | POA: Insufficient documentation

## 2020-05-10 LAB — CBC WITH DIFFERENTIAL/PLATELET
Abs Immature Granulocytes: 0.02 10*3/uL (ref 0.00–0.07)
Basophils Absolute: 0 10*3/uL (ref 0.0–0.1)
Basophils Relative: 0 %
Eosinophils Absolute: 0.2 10*3/uL (ref 0.0–0.5)
Eosinophils Relative: 4 %
HCT: 34.1 % — ABNORMAL LOW (ref 39.0–52.0)
Hemoglobin: 10.5 g/dL — ABNORMAL LOW (ref 13.0–17.0)
Immature Granulocytes: 0 %
Lymphocytes Relative: 23 %
Lymphs Abs: 1.5 10*3/uL (ref 0.7–4.0)
MCH: 22.7 pg — ABNORMAL LOW (ref 26.0–34.0)
MCHC: 30.8 g/dL (ref 30.0–36.0)
MCV: 73.8 fL — ABNORMAL LOW (ref 80.0–100.0)
Monocytes Absolute: 0.5 10*3/uL (ref 0.1–1.0)
Monocytes Relative: 7 %
Neutro Abs: 4.1 10*3/uL (ref 1.7–7.7)
Neutrophils Relative %: 66 %
Platelets: 144 10*3/uL — ABNORMAL LOW (ref 150–400)
RBC: 4.62 MIL/uL (ref 4.22–5.81)
RDW: 18.1 % — ABNORMAL HIGH (ref 11.5–15.5)
WBC: 6.3 10*3/uL (ref 4.0–10.5)
nRBC: 0 % (ref 0.0–0.2)

## 2020-05-10 LAB — COMPREHENSIVE METABOLIC PANEL
ALT: 14 U/L (ref 0–44)
AST: 24 U/L (ref 15–41)
Albumin: 3.7 g/dL (ref 3.5–5.0)
Alkaline Phosphatase: 43 U/L (ref 38–126)
Anion gap: 9 (ref 5–15)
BUN: 12 mg/dL (ref 8–23)
CO2: 24 mmol/L (ref 22–32)
Calcium: 8.7 mg/dL — ABNORMAL LOW (ref 8.9–10.3)
Chloride: 99 mmol/L (ref 98–111)
Creatinine, Ser: 1.01 mg/dL (ref 0.61–1.24)
GFR, Estimated: >60 mL/min (ref >60)
Glucose, Bld: 213 mg/dL — ABNORMAL HIGH (ref 70–99)
Potassium: 3.6 mmol/L (ref 3.5–5.1)
Sodium: 132 mmol/L — ABNORMAL LOW (ref 135–145)
Total Bilirubin: 0.8 mg/dL (ref 0.3–1.2)
Total Protein: 7.2 g/dL (ref 6.5–8.1)

## 2020-05-10 MED ORDER — TRAMADOL HCL 50 MG PO TABS
50.0000 mg | ORAL_TABLET | Freq: Once | ORAL | Status: AC
  Administered 2020-05-10: 50 mg via ORAL
  Filled 2020-05-10: qty 1

## 2020-05-10 MED ORDER — TRAMADOL HCL 50 MG PO TABS: 50.0000 mg | ORAL_TABLET | Freq: Two times a day (BID) | ORAL | 0 refills | Status: AC

## 2020-05-10 MED ORDER — SULFAMETHOXAZOLE-TRIMETHOPRIM 800-160 MG PO TABS: 1.0000 | ORAL_TABLET | Freq: Two times a day (BID) | ORAL | 0 refills | Status: DC

## 2020-05-10 MED ORDER — SULFAMETHOXAZOLE-TRIMETHOPRIM 800-160 MG PO TABS
1.0000 | ORAL_TABLET | Freq: Once | ORAL | Status: AC
  Administered 2020-05-10: 1 via ORAL
  Filled 2020-05-10: qty 1

## 2020-05-10 NOTE — ED Notes (Signed)
Pt c/o throbbing lower left leg pain that has not improved w/ antibiotics/OTC meds. Wound noted on left calf- no swelling, redness, or discharge noted.

## 2020-05-10 NOTE — Discharge Instructions (Addendum)
Take the antibiotic as directed, and the pain medicine as needed.  Keep the wound clean, dry, and covered as discussed.  Follow-up with your primary provider for wound check as scheduled.  Consider referral to the [facility] wound care center as above.

## 2020-05-10 NOTE — ED Provider Notes (Signed)
[facility] Emergency Department Provider Note   ____________________________________________   Event Date/Time   First MD Initiated Contact with Patient 05/10/20 1924     (approximate)  I have reviewed the triage vital signs and the nursing notes.   HISTORY  Chief Complaint Leg Pain  HPI Shane Bean is a 65 y.o. male below medical history, presents himself to the ED for evaluation of a persistent leg pain and a chronic wound.  Patient presents with a chronic, nonhealing wound to the left lower extremity.  He apparently made contact with his PCP in [place] via telephone, with request for a interim wound check on his visit on May 4.  Based on chart review that appointment has been rescheduled to May 10.  According to chart review, he was expecting a referral to the wound care center pending that visit.  His primary complaint at this time is some sharp, intermittent "nerve" pain at the proximal calf.  Patient reports this is the pain that keeps him up at night, secondary to this lower leg wound.  Patient denies any interim fever, chills, sweats, chest pain, or distal paresthesias. He denies any skin temp or color changes, and denies any swelling. He also denies any spontaneous purulent drainage from the wound.  He had previously been using Silvadene ointment but discontinued all but after it began to slough off the scab with each dressing change.  He has been leaving the wound uncovered to air for the last 3 to 4 days.     Past Medical History:  Diagnosis Date  . Arthritis   . Ascending aortic dissection ([place])   . Diabetes mellitus without complication ([place])   . DVT (deep venous thrombosis) ([facility])    legs and lungs   . Hyperlipemia   . Hypertension   . Kidney stones     Patient Active Problem List   Diagnosis Date Noted  . Personal history of colon cancer   . Hydrocele 02/26/2018  . Colon polyp 08/17/2017  . Anemia   . Polyp of sigmoid colon    . Benign neoplasm of descending colon   . Colon neoplasm   . Abdominal pain, epigastric   . Gastritis without bleeding   . Nephrolithiasis 11/06/2015  . Cough with sputum 01/14/2015  . Fatigue 01/14/2015  . Obesity 01/14/2015  . GERD (gastroesophageal reflux disease) 04/24/2014  . Long term current use of anticoagulant therapy 04/24/2014  . Type 2 diabetes mellitus with complication ([place]) [id number]  . Ascending aortic dissection ([place]) 10/04/2013  . HTN (hypertension) 05/28/2013  . Pulmonary emboli ([place]) 06/26/2012  . Pulmonary edema 06/23/2012  . Respiratory failure with hypoxia ([place]) 06/23/2012  . Osteoarthritis of left hip 04/19/2012  . Right leg DVT ([place]) 06/17/2011    Past Surgical History:  Procedure Laterality Date  . CARDIAC SURGERY    . COLONOSCOPY WITH PROPOFOL N/A 04/04/2017   Procedure: COLONOSCOPY WITH PROPOFOL;  Surgeon: Lucilla Lame, MD;  Location: [facility] ENDOSCOPY;  Service: Endoscopy;  Laterality: N/A;  . COLONOSCOPY WITH PROPOFOL N/A 05/21/2019   Procedure: COLONOSCOPY WITH PROPOFOL;  Surgeon: Lucilla Lame, MD;  Location: [facility] ENDOSCOPY;  Service: Endoscopy;  Laterality: N/A;  . DENTAL SURGERY    . ESOPHAGOGASTRODUODENOSCOPY (EGD) WITH PROPOFOL N/A 04/04/2017   Procedure: ESOPHAGOGASTRODUODENOSCOPY (EGD) WITH PROPOFOL;  Surgeon: Lucilla Lame, MD;  Location[facility] ENDOSCOPY;  Service: Endoscopy;  Laterality: N/A;  . LAPAROSCOPIC RIGHT HEMI COLECTOMY Right 08/17/2017   Procedure: LAPAROSCOPIC  RIGHT HEMI COLECTOMY, COLONOSCOPY, UMBILICAL HERNIA  REPAIR;  Surgeon: Ileana Roup, MD;  Location: [facility] ORS;  Service: General;  Laterality: Right;  . REPAIR THORACIC AORTA      Prior to Admission medications   Medication Sig Start Date End Date Taking? Authorizing Provider  sulfamethoxazole-trimethoprim (BACTRIM DS) 800-160 MG tablet Take 1 tablet by mouth 2 (two) times daily. 05/10/20  Yes Jencarlo Bonadonna, Dannielle Karvonen, PA-C  traMADol (ULTRAM) 50 MG tablet Take 1 tablet (50 mg  total) by mouth 2 (two) times daily for 5 days. 05/10/20 05/15/20 Yes Merlon Alcorta, Dannielle Karvonen, PA-C  allopurinol (ZYLOPRIM) 100 MG tablet Take 100 mg by mouth daily at 3 pm.    [provider]  amLODipine (NORVASC) 10 MG tablet Take 10 mg by mouth at bedtime.    [provider]  aspirin EC 325 MG tablet Take 162.5 mg by mouth daily.  10/15/13   [provider]  COVID-19 mRNA vaccine, Moderna, 100 MCG/0.5ML injection USE AS DIRECTED 11/01/19 10/31/20  Carlyle Basques, MD  doxazosin (CARDURA) 8 MG tablet Take 8 mg by mouth at bedtime.     [provider]  glipiZIDE (GLUCOTROL) 5 MG tablet Take 10 mg by mouth 2 (two) times daily.    [provider]  HYDROcodone-acetaminophen (NORCO) 5-325 MG tablet Take 1 tablet by mouth every 4 (four) hours as needed for moderate pain. 02/28/18   Dustin Flock, MD  metoprolol tartrate (LOPRESSOR) 100 MG tablet Take 100 mg by mouth 2 (two) times daily.    [provider]  ranitidine (ZANTAC) 150 MG capsule Take 150 mg by mouth daily at 3 pm.  10/15/13   [provider]  simvastatin (ZOCOR) 10 MG tablet Take 10 mg by mouth every evening.    [provider]  spironolactone (ALDACTONE) 25 MG tablet Take 25 mg by mouth at bedtime.    [provider]  warfarin (COUMADIN) 6 MG tablet Take 6 mg by mouth every evening.    [provider]    Allergies Chlorthalidone, Hydralazine, and Lisinopril  History reviewed. No pertinent family history.  Social History Social History   Tobacco Use  . Smoking status: Never Smoker  . Smokeless tobacco: Never Used  Vaping Use  . Vaping Use: Never used  Substance Use Topics  . Alcohol use: Yes    Alcohol/week: 28.0 standard drinks    Types: 28 Cans of beer per week    Comment: none olast 24hrs  . Drug use: No    Review of Systems Constitutional: No fever/chills Eyes: No visual changes. ENT: No sore throat. Cardiovascular: Denies chest  pain. Respiratory: Denies shortness of breath. Gastrointestinal: No abdominal pain.  No nausea, no vomiting.  No diarrhea.  No constipation. Genitourinary: Negative for dysuria. Musculoskeletal: Negative for back pain. Skin: Negative for rash. Neurological: Negative for headaches, focal weakness or numbness. ____________________________________________   PHYSICAL EXAM:  VITAL SIGNS: ED Triage Vitals  Enc Vitals Group     BP 05/10/20 1919 (!) 154/87     Pulse Rate 05/10/20 1919 77     Resp 05/10/20 1919 16     Temp 05/10/20 1919 99.2 F (37.3 C)     Temp Source 05/10/20 1919 Oral     SpO2 05/10/20 1919 95 %     Weight --      Height --      Head Circumference --      Peak Flow --      Pain Score 05/10/20 1920 9     Pain  Loc --      Pain Edu? --      Excl. in [place]? --     Constitutional: Alert and oriented. Well appearing and in no acute distress. Eyes: Conjunctivae are normal. PERRL. EOMI. Head: Atraumatic. Nose: No congestion/rhinnorhea. Mouth/Throat: Mucous membranes are moist.  Oropharynx non-erythematous. Neck: No stridor.   Cardiovascular: Normal rate, regular rhythm. Grossly normal heart sounds.  Good peripheral circulation. Respiratory: Normal respiratory effort.  No retractions. Lungs CTAB. Gastrointestinal: Soft and nontender. No distention. No abdominal bruits. No CVA tenderness. Musculoskeletal: No lower extremity tenderness nor edema.  No joint effusions. Neurologic:  Normal speech and language. No gross focal neurologic deficits are appreciated. No gait instability. Skin:  Skin is warm, dry and intact. No rash noted.  No CCE noted distally.  The left posterior lower calf has a scabbed wound with some mild surrounding erythema.  The wound measures approximately 4 cm in diameter.  No warmth, induration, or fluctuance is noted.  No lymphangitis is appreciated. ____________________________________________   LABS (all labs ordered are listed, but only abnormal  results are displayed)  Labs Reviewed  CBC WITH DIFFERENTIAL/PLATELET - Abnormal; Notable for the following components:      Result Value   Hemoglobin 10.5 (*)    HCT 34.1 (*)    MCV 73.8 (*)    MCH 22.7 (*)    RDW 18.1 (*)    Platelets 144 (*)    All other components within normal limits  COMPREHENSIVE METABOLIC PANEL - Abnormal; Notable for the following components:   Sodium 132 (*)    Glucose, Bld 213 (*)    Calcium 8.7 (*)    All other components within normal limits   ____________________________________________  EKG  ____________________________________________  RADIOLOGY I, Melvenia Needles, personally viewed and evaluated these images (plain radiographs) as part of my medical decision making, as well as reviewing the written report by the radiologist.  ED MD interpretation:    Official radiology report(s): No results found.  ____________________________________________   PROCEDURES  Procedure(s) performed (including Critical Care):  Procedures  Bactrim DS 1 p.o. Tramadol 50 mg 1 p.o.  ____________________________________________   INITIAL IMPRESSION / ASSESSMENT AND PLAN / ED COURSE  As part of my medical decision making, I reviewed the following data within the [place] reviewed  WNL, Notes from prior ED visits and [place] Controlled Substance Database    DDX: cellulitis, abscess, venous stasis ulcer, non-healing wound, paresthesia, RLS, neuropathy  Patient with ED evaluation of left leg pain in the presence of a chronic wound.  Patient clinical picture is reassuring at this time.  Labs not reveal any acute signs of infectious process or sepsis.  Exam is overall benign as the wound is not open, weeping, or showing signs of secondary infection.  Patient will be discharged however, started on a empiric course of Bactrim at this time.  Wound care instructions and supplies also provided.  Patient will follow with his PCP in 2 days  for interim wound check.  Has been referred to [place] wound care center for further wound care management.   ____________________________________________  FINAL CLINICAL IMPRESSION(S) / ED DIAGNOSES  Final diagnoses:  Open leg wound, unspecified laterality, subsequent encounter     ED Discharge Orders         Ordered    traMADol (ULTRAM) 50 MG tablet  2 times daily        05/10/20 2117    sulfamethoxazole-trimethoprim (BACTRIM DS) 800-160 MG tablet  2 times daily        05/10/20 2117          *Please note:  Shane Bean was evaluated in Emergency Department on 05/10/2020 for the symptoms described in the history of present illness. He was evaluated in the context of the global COVID-19 pandemic, which necessitated consideration that the patient might be at risk for infection with the SARS-CoV-2 virus that causes COVID-19. Institutional protocols and algorithms that pertain to the evaluation of patients at risk for COVID-19 are in a state of rapid change based on information released by regulatory bodies including the CDC and federal and state organizations. These policies and algorithms were followed during the patient's care in the ED.  Some ED evaluations and interventions may be delayed as a result of limited staffing during and the pandemic.*   Note:  This document was prepared using Dragon voice recognition software and may include unintentional dictation errors.    Melvenia Needles, PA-C 05/10/20 2259    Duffy Bruce, MD 05/15/20 0830

## 2020-05-10 NOTE — ED Notes (Signed)
Wound bandaged by EDP

## 2020-05-10 NOTE — ED Triage Notes (Signed)
Pt presents via POV c/o LLE wound and pain x1 month. Reports seen in ED for wound and given abx which has been completed. Reports pain not improved with Tylenol. Reports pain wakes up in the middle of the night.

## 2020-05-18 ENCOUNTER — Other Ambulatory Visit: Payer: Self-pay

## 2020-05-18 ENCOUNTER — Encounter: Payer: Medicare (Managed Care) | Attending: Physician Assistant | Admitting: Physician Assistant

## 2020-05-18 DIAGNOSIS — I10 Essential (primary) hypertension: Secondary | ICD-10-CM | POA: Diagnosis not present

## 2020-05-18 DIAGNOSIS — I872 Venous insufficiency (chronic) (peripheral): Secondary | ICD-10-CM | POA: Diagnosis not present

## 2020-05-18 DIAGNOSIS — E11622 Type 2 diabetes mellitus with other skin ulcer: Secondary | ICD-10-CM | POA: Insufficient documentation

## 2020-05-18 DIAGNOSIS — L97822 Non-pressure chronic ulcer of other part of left lower leg with fat layer exposed: Secondary | ICD-10-CM | POA: Diagnosis not present

## 2020-05-18 DIAGNOSIS — E1140 Type 2 diabetes mellitus with diabetic neuropathy, unspecified: Secondary | ICD-10-CM | POA: Diagnosis not present

## 2020-05-19 NOTE — Progress Notes (Signed)
Shane Bean ([id number]) Visit Report for 05/18/2020 Chief Complaint Document Details Patient Name: Shane Bean, Shane Bean Date of Service: 05/18/2020 8:30 AM Medical Record Number: [id number] Patient Account Number: [id number] Date of Birth/Sex: 28-Jan-1955 (65 y.o. M) Treating RN: Donnamarie Poag Primary Care Provider: Reeves Dam Other Clinician: Referring Provider: Duffy Bruce Treating Provider/Extender: Skipper Cliche in Treatment: 0 Information Obtained from: Patient Chief Complaint Left LE Ulcer Electronic Signature(s) Signed: 05/18/2020 9:39:10 AM By: Worthy Keeler PA-C Entered By: Worthy Keeler on 05/18/2020 09:39:10 Shane Bean ([id number]) -------------------------------------------------------------------------------- HPI Details Patient Name: Shane Bean Date of Service: 05/18/2020 8:30 AM Medical Record Number: [id number] Patient Account Number: [id number] Date of Birth/Sex: July 19, 1955 (65 y.o. M) Treating RN: Donnamarie Poag Primary Care Provider: Reeves Dam Other Clinician: Referring Provider: Duffy Bruce Treating Provider/Extender: Skipper Cliche in Treatment: 0 History of Present Illness HPI Description: 05/18/2020 upon evaluation today patient appears for initial evaluation here in clinic concerning issues that he has been having with the wound on his left lateral leg. Fortunately there does not appear to be any signs of obvious an active infection at this time which is great news. With that being said the patient unfortunately is continued to have issues with pain although he tells me it is gotten a lot better. He was on Keflex as well as Bactrim DS which seems to have done a good job there. His most recent hemoglobin A1c was 9.8 that was on 04/14/2020. Subsequently I do feel like that the patient is making progress here. He does have a history of chronic venous insufficiency as well as hypertension. I do not see any need for antibiotics  at this point. Electronic Signature(s) Signed: 05/18/2020 10:05:41 AM By: Worthy Keeler PA-C Entered By: Worthy Keeler on 05/18/2020 10:05:41 Shane Bean ([id number]) -------------------------------------------------------------------------------- Physical Exam Details Patient Name: Shane Bean Date of Service: 05/18/2020 8:30 AM Medical Record Number: [id number] Patient Account Number: [id number] Date of Birth/Sex: Jan 03, 1956 (65 y.o. M) Treating RN: Donnamarie Poag Primary Care Provider: Reeves Dam Other Clinician: Referring Provider: Duffy Bruce Treating Provider/Extender: Skipper Cliche in Treatment: 0 Constitutional sitting or standing blood pressure is within target range for patient.. pulse regular and within target range for patient.Marland Kitchen respirations regular, non- labored and within target range for patient.Marland Kitchen temperature within target range for patient.. Well-nourished and well-hydrated in no acute distress. Eyes conjunctiva clear no eyelid edema noted. pupils equal round and reactive to light and accommodation. Ears, Nose, Mouth, and Throat no gross abnormality of ear auricles or external auditory canals. normal hearing noted during conversation. mucus membranes moist. Respiratory normal breathing without difficulty. Cardiovascular 2+ dorsalis pedis/posterior tibialis pulses. 1+ pitting edema of the bilateral lower extremities. Musculoskeletal normal gait and posture. no significant deformity or arthritic changes, no loss or range of motion, no clubbing. Psychiatric this patient is able to make decisions and demonstrates good insight into disease process. Alert and Oriented x 3. pleasant and cooperative. Notes Upon inspection patient's wound bed actually showed signs of good granulation epithelization at this point. There does not appear to be any signs of active infection which is great news and overall very pleased with where things stand today. His blood  pressure seems to be doing quite well today as well. Electronic Signature(s) Signed: 05/18/2020 10:06:28 AM By: Worthy Keeler PA-C Entered By: Worthy Keeler on 05/18/2020 10:06:28 Shane Bean ([id number]) -------------------------------------------------------------------------------- Physician Orders Details Patient Name: Shane Bean Date of Service: 05/18/2020 8:30 AM Medical  Record Number: [id number] Patient Account Number: [id number] Date of Birth/Sex: 1955-06-28 (65 y.o. M) Treating RN: Donnamarie Poag Primary Care Provider: Reeves Dam Other Clinician: Referring Provider: Duffy Bruce Treating Provider/Extender: Skipper Cliche in Treatment: 0 Verbal / Phone Orders: No Diagnosis Coding ICD-10 Coding Code Description E11.622 Type 2 diabetes mellitus with other skin ulcer I87.2 Venous insufficiency (chronic) (peripheral) 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 layer exposed I10 Essential (primary) hypertension Follow-up Appointments o Return Appointment in 1 week. o Nurse Visit as needed o Other: - plan nurse visit for first week on Thursday/Friday Bathing/ Shower/ Hygiene o May shower with wound dressing protected with water repellent cover or cast protector. - cover dressing during shower-do not get it wet Edema Control - Lymphedema / Segmental Compressive Device / Other o Optional: One layer of unna paste to top of compression wrap (to act as an anchor). Wound Treatment Wound #1 - Lower Leg Wound Laterality: Left, Posterior Cleanser: Soap and Water Discharge Instructions: Gently cleanse wound with antibacterial soap, rinse and pat dry prior to dressing wounds Primary Dressing: Iodosorb 40 (g) Discharge Instructions: Apply IodoSorb to wound bed only as directed. Secondary Dressing: ABD Pad 5x9 (in/in) Discharge Instructions: Cover with ABD pad Compression Wrap: Profore Lite LF 3 Multilayer Compression  Bandaging System Discharge Instructions: Apply 3 multi-layer wrap as prescribed. Electronic Signature(s) Signed: 05/18/2020 5:15:41 PM By: Worthy Keeler PA-C Signed: 05/19/2020 2:35:55 PM By: Donnamarie Poag Entered By: Donnamarie Poag on 05/18/2020 09:50:56 Shane Bean ([id number]) -------------------------------------------------------------------------------- Problem List Details Patient Name: Shane Bean Date of Service: 05/18/2020 8:30 AM Medical Record Number: [id number] Patient Account Number: [id number] Date of Birth/Sex: 1955-09-24 (65 y.o. M) Treating RN: Donnamarie Poag Primary Care Provider: Reeves Dam Other Clinician: Referring Provider: Duffy Bruce Treating Provider/Extender: Skipper Cliche in Treatment: 0 Active Problems ICD-10 Encounter Code Description Active Date MDM Diagnosis E11.622 Type 2 diabetes mellitus with other skin ulcer 05/18/2020 No Yes I87.2 Venous insufficiency (chronic) (peripheral) 05/18/2020 No Yes 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 layer 05/18/2020 No Yes exposed [place] (primary) hypertension 05/18/2020 No Yes Inactive Problems Resolved Problems Electronic Signature(s) Signed: 05/18/2020 9:32:15 AM By: Worthy Keeler PA-C Entered By: Worthy Keeler on 05/18/2020 09:32:15 Shane Bean ([id number]) -------------------------------------------------------------------------------- Progress Note Details Patient Name: Shane Bean Date of Service: 05/18/2020 8:30 AM Medical Record Number: [id number] Patient Account Number: [id number] Date of Birth/Sex: [birth year] (65 y.o. M) Treating RN: Donnamarie Poag Primary Care Provider: Reeves Dam Other Clinician: Referring Provider: Duffy Bruce Treating Provider/Extender: Skipper Cliche in Treatment: 0 Subjective Chief Complaint Information obtained from Patient Left LE Ulcer History of Present Illness (HPI) 05/18/2020 upon  evaluation today patient appears for initial evaluation here in clinic concerning issues that he has been having with the wound on his left lateral leg. Fortunately there does not appear to be any signs of obvious an active infection at this time which is great news. With that being said the patient unfortunately is continued to have issues with pain although he tells me it is gotten a lot better. He was on Keflex as well as Bactrim DS which seems to have done a good job there. His most recent hemoglobin A1c was 9.8 that was on 04/14/2020. Subsequently I do feel like that the patient is making progress here. He does have a history of chronic venous insufficiency as well as hypertension. I do not see any need  for antibiotics at this point. Patient History Information obtained from Patient. Allergies chlorthalidone, hydralazine, lisinopril Social History Never smoker, Marital Status - Single, Alcohol Use - Moderate, Drug Use - No History, Caffeine Use - Never. Medical History Eyes Denies history of Cataracts, Glaucoma, Optic Neuritis Ear/Nose/Mouth/Throat Denies history of Chronic sinus problems/congestion Hematologic/Lymphatic Denies history of Anemia, Hemophilia, Human Immunodeficiency Virus, Lymphedema Respiratory Denies history of Aspiration, Asthma, Chronic Obstructive Pulmonary Disease (COPD), Pneumothorax, Sleep Apnea, Tuberculosis Cardiovascular Patient has history of Hypertension Denies history of Angina, Arrhythmia, Congestive Heart Failure, Coronary Artery Disease, Deep Vein Thrombosis, Hypotension, Myocardial Infarction, Peripheral Arterial Disease, Peripheral Venous Disease, Phlebitis, Vasculitis Gastrointestinal Denies history of Cirrhosis , Colitis, Crohn s, Hepatitis A, Hepatitis B Endocrine Patient has history of Type II Diabetes Denies history of Type I Diabetes Genitourinary Denies history of End Stage Renal Disease Immunological Denies history of Lupus Erythematosus,  Raynaud s, Scleroderma Integumentary (Skin) Denies history of History of Burn, History of pressure wounds Musculoskeletal Denies history of Gout, Rheumatoid Arthritis, Osteoarthritis, Osteomyelitis Neurologic Patient has history of Neuropathy Denies history of Dementia, Quadriplegia, Paraplegia, Seizure Disorder Oncologic Denies history of Received Chemotherapy, Received Radiation Psychiatric Denies history of Anorexia/bulimia, Confinement Anxiety Patient is treated with Oral Agents. Blood sugar is not tested. Review of Systems (ROS) Constitutional Symptoms (General Health) Denies complaints or symptoms of Fatigue, Fever, Chills, Marked Weight Change. Eyes Denies complaints or symptoms of Dry Eyes, Vision Changes, Glasses / Contacts. Ear/Nose/Mouth/Throat [place], Virgil Benedict ([id number]) Denies complaints or symptoms of Difficult clearing ears, Sinusitis. Hematologic/Lymphatic Denies complaints or symptoms of Bleeding / Clotting Disorders, Human Immunodeficiency Virus. Respiratory Denies complaints or symptoms of Chronic or frequent coughs, Shortness of Breath. Cardiovascular Denies complaints or symptoms of Chest pain, LE edema. Gastrointestinal Denies complaints or symptoms of Frequent diarrhea, Nausea, Vomiting. Endocrine Denies complaints or symptoms of Hepatitis, Thyroid disease, Polydypsia (Excessive Thirst). Genitourinary Denies complaints or symptoms of Kidney failure/ Dialysis, Incontinence/dribbling. Immunological Denies complaints or symptoms of Hives, Itching. Integumentary (Skin) Complains or has symptoms of Wounds. Denies complaints or symptoms of Bleeding or bruising tendency, Breakdown, Swelling. Musculoskeletal Denies complaints or symptoms of Muscle Pain, Muscle Weakness. Neurologic Denies complaints or symptoms of Numbness/parasthesias, Focal/Weakness. Psychiatric Denies complaints or symptoms of Anxiety, Claustrophobia. Objective Constitutional sitting  or standing blood pressure is within target range for patient.. pulse regular and within target range for patient.Marland Kitchen respirations regular, non- labored and within target range for patient.Marland Kitchen temperature within target range for patient.. Well-nourished and well-hydrated in no acute distress. Vitals Time Taken: 9:00 AM, Height: 70 in, Source: Stated, Weight: 290 lbs, Source: Stated, BMI: 41.6, Temperature: 98.4 F, Pulse: 74 bpm, Respiratory Rate: 18 breaths/min, Blood Pressure: 120/74 mmHg. Eyes conjunctiva clear no eyelid edema noted. pupils equal round and reactive to light and accommodation. Ears, Nose, Mouth, and Throat no gross abnormality of ear auricles or external auditory canals. normal hearing noted during conversation. mucus membranes moist. Respiratory normal breathing without difficulty. Cardiovascular 2+ dorsalis pedis/posterior tibialis pulses. 1+ pitting edema of the bilateral lower extremities. Musculoskeletal normal gait and posture. no significant deformity or arthritic changes, no loss or range of motion, no clubbing. Psychiatric this patient is able to make decisions and demonstrates good insight into disease process. Alert and Oriented x 3. pleasant and cooperative. General Notes: Upon inspection patient's wound bed actually showed signs of good granulation epithelization at this point. There does not appear to be any signs of active infection which is great news and overall very pleased with where things stand today. His  blood pressure seems to be doing quite well today as well. Integumentary (Hair, Skin) Wound #1 status is Open. Original cause of wound was Gradually Appeared. The date acquired was: 04/08/2020. The wound is located on the Left,Posterior Lower Leg. The wound measures 4cm length x 3.5cm width x 0.1cm depth; 10.996cm^2 area and 1.1cm^3 volume. There is Fat Layer (Subcutaneous Tissue) exposed. There is no tunneling or undermining noted. There is a small amount of  serosanguineous drainage noted. There is no granulation within the wound bed. There is a large (67-100%) amount of necrotic tissue within the wound bed including Eschar and Adherent Slough. GREELY, ATIYEH ([id number]) Assessment Active Problems ICD-10 Type 2 diabetes mellitus with other skin ulcer Venous insufficiency (chronic) (peripheral) Non-pressure chronic ulcer of other part of left lower leg with fat layer exposed Essential (primary) hypertension Procedures Wound #1 Pre-procedure diagnosis of Wound #1 is a Diabetic Wound/Ulcer of the Lower Extremity located on the Left,Posterior Lower Leg . There was a Three Layer Compression Therapy Procedure by Donnamarie Poag, RN. Post procedure Diagnosis Wound #1: Same as Pre-Procedure Plan Follow-up Appointments: Return Appointment in 1 week. Nurse Visit as needed Other: - plan nurse visit for first week on Thursday/Friday Bathing/ Shower/ Hygiene: May shower with wound dressing protected with water repellent cover or cast protector. - cover dressing during shower-do not get it wet Edema Control - Lymphedema / Segmental Compressive Device / Other: Optional: One layer of unna paste to top of compression wrap (to act as an anchor). WOUND #1: - Lower Leg Wound Laterality: Left, Posterior Cleanser: Soap and Water Discharge Instructions: Gently cleanse wound with antibacterial soap, rinse and pat dry prior to dressing wounds Primary Dressing: Iodosorb 40 (g) Discharge Instructions: Apply IodoSorb to wound bed only as directed. Secondary Dressing: ABD Pad 5x9 (in/in) Discharge Instructions: Cover with ABD pad Compression Wrap: Profore Lite LF 3 Multilayer Compression Bandaging System Discharge Instructions: Apply 3 multi-layer wrap as prescribed. 1. Would recommend that we go ahead and initiate treatment with Iodosorb gel I think this will probably be the best way to go. The patient is in agreement with that plan. The patient is going to  require sharp debridement but this week I felt like it would be much too painful as it was very dry and hardened on as far as the eschar was concerned. I am hoping the Iodosorb gel will help soften this up a little bit under the wrap of the next week. 2. I am also can recommend that we cover this with an ABD pad and a 3 layer compression wrap to help with edema control. 3. We will have him come back for a nurse visit at the end of this week in order to reevaluate and make sure the wrap is not slipping down. Obviously I think that is of concern for sure at this point. We will see patient back for reevaluation in 1 week here in the clinic. If anything worsens or changes patient will contact our office for additional recommendations. Electronic Signature(s) Signed: 05/18/2020 10:08:19 AM By: Worthy Keeler PA-C Entered By: Worthy Keeler on 05/18/2020 10:08:19 Shane Bean ([id number]) -------------------------------------------------------------------------------- ROS/PFSH Details Patient Name: Shane Bean Date of Service: 05/18/2020 8:30 AM Medical Record Number: [id number] Patient Account Number: [id number] Date of Birth/Sex: September 28, 1955 (65 y.o. M) Treating RN: Carlene Coria Primary Care Provider: Reeves Dam Other Clinician: Referring Provider: Duffy Bruce Treating Provider/Extender: Skipper Cliche in Treatment: 0 Information Obtained From Patient Constitutional Symptoms (General Health) Complaints  and Symptoms: Negative for: Fatigue; Fever; Chills; Marked Weight Change Eyes Complaints and Symptoms: Negative for: Dry Eyes; Vision Changes; Glasses / Contacts Medical History: Negative for: Cataracts; Glaucoma; Optic Neuritis Ear/Nose/Mouth/Throat Complaints and Symptoms: Negative for: Difficult clearing ears; Sinusitis Medical History: Negative for: Chronic sinus problems/congestion Hematologic/Lymphatic Complaints and Symptoms: Negative for: Bleeding /  Clotting Disorders; Human Immunodeficiency Virus Medical History: Negative for: Anemia; Hemophilia; Human Immunodeficiency Virus; Lymphedema Respiratory Complaints and Symptoms: Negative for: Chronic or frequent coughs; Shortness of Breath Medical History: Negative for: Aspiration; Asthma; Chronic Obstructive Pulmonary Disease (COPD); Pneumothorax; Sleep Apnea; Tuberculosis Cardiovascular Complaints and Symptoms: Negative for: Chest pain; LE edema Medical History: Positive for: Hypertension Negative for: Angina; Arrhythmia; Congestive Heart Failure; Coronary Artery Disease; Deep Vein Thrombosis; Hypotension; Myocardial Infarction; Peripheral Arterial Disease; Peripheral Venous Disease; Phlebitis; Vasculitis Gastrointestinal Complaints and Symptoms: Negative for: Frequent diarrhea; Nausea; Vomiting Medical History: Negative for: Cirrhosis ; Colitis; Crohnos; Hepatitis A; Hepatitis B Endocrine Kampe, Vasili V. ([id number]) Complaints and Symptoms: Negative for: Hepatitis; Thyroid disease; Polydypsia (Excessive Thirst) Medical History: Positive for: Type II Diabetes Negative for: Type I Diabetes Time with diabetes: 3 Treated with: Oral agents Blood sugar tested every day: No Genitourinary Complaints and Symptoms: Negative for: Kidney failure/ Dialysis; Incontinence/dribbling Medical History: Negative for: End Stage Renal Disease Immunological Complaints and Symptoms: Negative for: Hives; Itching Medical History: Negative for: Lupus Erythematosus; Raynaudos; Scleroderma Integumentary (Skin) Complaints and Symptoms: Positive for: Wounds Negative for: Bleeding or bruising tendency; Breakdown; Swelling Medical History: Negative for: History of Burn; History of pressure wounds Musculoskeletal Complaints and Symptoms: Negative for: Muscle Pain; Muscle Weakness Medical History: Negative for: Gout; Rheumatoid Arthritis; Osteoarthritis; Osteomyelitis Neurologic Complaints  and Symptoms: Negative for: Numbness/parasthesias; Focal/Weakness Medical History: Positive for: Neuropathy Negative for: Dementia; Quadriplegia; Paraplegia; Seizure Disorder Psychiatric Complaints and Symptoms: Negative for: Anxiety; Claustrophobia Medical History: Negative for: Anorexia/bulimia; Confinement Anxiety Oncologic Medical History: Negative for: Received Chemotherapy; Received Radiation Immunizations Pneumococcal Vaccine: Received Pneumococcal Vaccination: LEXX, MONTE ([id number]) Implantable Devices None Family and Social History Never smoker; Marital Status - Single; Alcohol Use: Moderate; Drug Use: No History; Caffeine Use: Never; Financial Concerns: No; Food, Clothing or Shelter Needs: No; Support System Lacking: No; Transportation Concerns: No Electronic Signature(s) Signed: 05/18/2020 5:15:41 PM By: Worthy Keeler PA-C Signed: 05/18/2020 8:20:21 PM By: Carlene Coria RN Entered By: Carlene Coria on 05/18/2020 09:05:11 Shane Bean ([id number]) -------------------------------------------------------------------------------- SuperBill Details Patient Name: Shane Bean Date of Service: 05/18/2020 Medical Record Number: [id number] Patient Account Number: [id number] Date of Birth/Sex: [birth year] (65 y.o. M) Treating RN: Donnamarie Poag Primary Care Provider: Reeves Dam Other Clinician: Referring Provider: Duffy Bruce Treating Provider/Extender: Skipper Cliche in Treatment: 0 Diagnosis Coding ICD-10 Codes Code Description E11.622 Type 2 diabetes mellitus with other skin ulcer I87.2 Venous insufficiency (chronic) (peripheral) 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 layer exposed I10 Essential (primary) hypertension Facility Procedures CPT4 Code: [id number] Description: 99213 - WOUND CARE VISIT-LEV 3 EST PT Modifier: Quantity: 1 Physician Procedures CPT4 Code: [id number] Description: WC PHYS LEVEL 3 o  NEW PT Modifier: Quantity: 1 CPT4 Code: Description: ICD-10 Diagnosis Description E11.622 Type 2 diabetes mellitus with other skin ulcer I87.2 Venous insufficiency (chronic) (peripheral) 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 l I10 Essential (primary)  hypertension Modifier: ayer exposed Quantity: Electronic Signature(s) Signed: 05/18/2020 10:08:34 AM By: Worthy Keeler PA-C Entered By: Worthy Keeler on 05/18/2020 10:08:34

## 2020-05-19 NOTE — Progress Notes (Signed)
Shane Bean, Shane Bean ([id number]) Visit Report for 05/18/2020 Abuse/Suicide Risk Screen Details Patient Name: Shane Bean, Shane Bean Date of Service: 05/18/2020 8:30 AM Medical Record Number: [id number] Patient Account Number: [id number] Date of Birth/Sex: 03/14/1955 (64 y.o. M) Treating RN: Carlene Coria Primary Care Zane Samson: Reeves Dam Other Clinician: Referring Maudell Stanbrough: Duffy Bruce Treating Antonique Langford/Extender: Skipper Cliche in Treatment: 0 Abuse/Suicide Risk Screen Items Answer ABUSE RISK SCREEN: Has anyone close to you tried to hurt or harm you recentlyo No Do you feel uncomfortable with anyone in your familyo No Has anyone forced you do things that you didnot want to doo No Electronic Signature(s) Signed: 05/18/2020 8:20:21 PM By: Carlene Coria RN Entered By: Carlene Coria on 05/18/2020 09:05:18 Shane Bean ([id number]) -------------------------------------------------------------------------------- Activities of Daily Living Details Patient Name: Shane Bean Date of Service: 05/18/2020 8:30 AM Medical Record Number: [id number] Patient Account Number: [id number] Date of Birth/Sex: 11-07-1955 (64 y.o. M) Treating RN: Carlene Coria Primary Care Ryoma Nofziger: Reeves Dam Other Clinician: Referring Tyron Manetta: Duffy Bruce Treating Redell Nazir/Extender: Skipper Cliche in Treatment: 0 Activities of Daily Living Items Answer Activities of Daily Living (Please select one for each item) Drive Automobile Completely Able Take Medications Completely Able Use Telephone Completely Able Care for Appearance Completely Able Use Toilet Completely Able Bath / Shower Completely Able Dress Self Completely Able Feed Self Completely Able Walk Completely Able Get In / Out Bed Completely Able Housework Completely Able Prepare Meals Completely Able Handle Money Completely Able Shop for Self Completely Able Electronic Signature(s) Signed: 05/18/2020 8:20:21 PM By: Carlene Coria  RN Entered By: Carlene Coria on 05/18/2020 09:05:39 Shane Bean ([id number]) -------------------------------------------------------------------------------- Education Screening Details Patient Name: Shane Bean Date of Service: 05/18/2020 8:30 AM Medical Record Number: [id number] Patient Account Number: [id number] Date of Birth/Sex: 24-Feb-1955 (64 y.o. M) Treating RN: Carlene Coria Primary Care Lamoyne Hessel: Reeves Dam Other Clinician: Referring Esteen Delpriore: Duffy Bruce Treating Abigail Marsiglia/Extender: Skipper Cliche in Treatment: 0 Primary Learner Assessed: Patient Learning Preferences/Education Level/Primary Language Learning Preference: Explanation Highest Education Level: High School Preferred Language: English Cognitive Barrier Language Barrier: No Translator Needed: No Memory Deficit: No Emotional Barrier: No Cultural/Religious Beliefs Affecting Medical Care: No Physical Barrier Impaired Vision: No Impaired Hearing: No Decreased Hand dexterity: No Knowledge/Comprehension Knowledge Level: Medium Comprehension Level: Medium Ability to understand written instructions: High Ability to understand verbal instructions: High Motivation Anxiety Level: Calm Cooperation: Cooperative Education Importance: Acknowledges Need Interest in Health Problems: Asks Questions Perception: Coherent Willingness to Engage in Self-Management High Activities: Electronic Signature(s) Signed: 05/18/2020 8:20:21 PM By: Carlene Coria RN Entered By: Carlene Coria on 05/18/2020 09:06:10 Shane Bean ([id number]) -------------------------------------------------------------------------------- Fall Risk Assessment Details Patient Name: Shane Bean Date of Service: 05/18/2020 8:30 AM Medical Record Number: [id number] Patient Account Number: [id number] Date of Birth/Sex: June 09, 1955 (64 y.o. M) Treating RN: Carlene Coria Primary Care Braedin Millhouse: Reeves Dam Other  Clinician: Referring Jemar Paulsen: Duffy Bruce Treating Almeter Westhoff/Extender: Skipper Cliche in Treatment: 0 Fall Risk Assessment Items Have you had 2 or more falls in the last 12 monthso 0 No Have you had any fall that resulted in injury in the last 12 monthso 0 No FALLS RISK SCREEN History of falling - immediate or within 3 months 0 No Secondary diagnosis (Do you have 2 or more medical diagnoseso) 0 No Ambulatory aid None/bed rest/wheelchair/nurse 0 No Crutches/cane/walker 0 No Furniture 0 No Intravenous therapy Access/Saline/Heparin Lock 0 No Gait/Transferring Normal/ bed rest/ wheelchair 0 No Weak (short steps with or without shuffle, stooped but able to lift  head while walking, may 0 No seek support from furniture) Impaired (short steps with shuffle, may have difficulty arising from chair, head down, impaired 0 No balance) Mental Status Oriented to own ability 0 No Electronic Signature(s) Signed: 05/18/2020 8:20:21 PM By: Carlene Coria RN Entered By: Carlene Coria on 05/18/2020 09:06:21 Shane Bean ([id number]) -------------------------------------------------------------------------------- Foot Assessment Details Patient Name: Shane Bean Date of Service: 05/18/2020 8:30 AM Medical Record Number: [id number] Patient Account Number: [id number] Date of Birth/Sex: 1955/11/09 (64 y.o. M) Treating RN: Carlene Coria Primary Care Fatisha Rabalais: Reeves [place] Other Clinician: Referring Haruki Arnold: Duffy Bruce Treating Amyria Komar/Extender: Skipper Cliche in Treatment: 0 Foot Assessment Items Site Locations + = Sensation present, - = Sensation absent, C = Callus, U = Ulcer R = Redness, W = Warmth, M = Maceration, PU = Pre-ulcerative lesion F = Fissure, S = Swelling, D = Dryness Assessment Right: Left: Other Deformity: No No Prior Foot Ulcer: No No Prior Amputation: No No Charcot Joint: No No Ambulatory Status: Ambulatory Without Help Gait: Steady Electronic  Signature(s) Signed: 05/18/2020 8:20:21 PM By: Carlene Coria RN Entered By: Carlene Coria on 05/18/2020 [place], [place]. ([id number]) -------------------------------------------------------------------------------- Nutrition Risk Screening Details Patient Name: Shane Bean Date of Service: 05/18/2020 8:30 AM Medical Record Number: [id number] Patient Account Number: [id number] Date of Birth/Sex: Nov 17, 1955 (64 y.o. M) Treating RN: Carlene Coria Primary Care Jamy Whyte: Reeves Dam Other Clinician: Referring Josaiah Muhammed: Duffy Bruce Treating Demarus Latterell/Extender: Skipper Cliche in Treatment: 0 Height (in): 70 Weight (lbs): 290 Body Mass Index (BMI): 41.6 Nutrition Risk Screening Items Score Screening NUTRITION RISK SCREEN: I have an illness or condition that made me change the kind and/or amount of food I eat 0 No I eat fewer than two meals per day 0 No I eat few fruits and vegetables, or milk products 0 No I have three or more drinks of beer, liquor or wine almost every day 0 No I have tooth or mouth problems that make it hard for me to eat 0 No I don't always have enough money to buy the food I need 0 No I eat alone most of the time 0 No I take three or more different prescribed or over-the-counter drugs a day 1 Yes Without wanting to, I have lost or gained 10 pounds in the last six months 0 No I am not always physically able to shop, cook and/or feed myself 0 No Nutrition Protocols Good Risk Protocol 0 No interventions needed Moderate Risk Protocol High Risk Proctocol Risk Level: Good Risk Score: 1 Electronic Signature(s) Signed: 05/18/2020 8:20:21 PM By: Carlene Coria RN Entered By: Carlene Coria on 05/18/2020 09:06:31

## 2020-05-19 NOTE — Progress Notes (Signed)
AZAHEL, RISH ([id number]) Visit Report for 05/18/2020 Allergy List Details Patient Name: Shane Bean, Shane Bean Date of Service: 05/18/2020 8:30 AM Medical Record Number: [id number] Patient Account Number: [id number] Date of Birth/Sex: [birth year] (64 y.o. M) Treating RN: Carlene Coria Primary Care Audyn Dimercurio: Reeves Dam Other Clinician: Referring Marbella Markgraf: Duffy Bruce Treating Javante Nilsson/Extender: Skipper Cliche in Treatment: 0 Allergies Active Allergies chlorthalidone hydralazine lisinopril Allergy Notes Electronic Signature(s) Signed: 05/18/2020 8:20:21 PM By: Carlene Coria RN Entered By: Carlene Coria on 05/18/2020 09:02:01 Shane Bean ([id number]) -------------------------------------------------------------------------------- Arrival Information Details Patient Name: Shane Bean Date of Service: 05/18/2020 8:30 AM Medical Record Number: [id number] Patient Account Number: [id number] Date of Birth/Sex: 10-03-1955 (64 y.o. M) Treating RN: Carlene Coria Primary Care Garris Melhorn: Reeves Dam Other Clinician: Referring Bessie Livingood: Duffy Bruce Treating Tommy Goostree/Extender: Skipper Cliche in Treatment: 0 Visit Information Patient Arrived: Ambulatory Arrival Time: 08:56 Accompanied By: self Transfer Assistance: None Patient Identification Verified: Yes Secondary Verification Process Completed: Yes Patient Requires Transmission-Based No Precautions: Patient Has Alerts: Yes Patient Alerts: Patient on Blood Thinner DIABETIC Electronic Signature(s) Signed: 05/19/2020 2:35:55 PM By: Donnamarie Poag Entered By: Donnamarie Poag on 05/18/2020 09:52:14 Shane Bean ([id number]) -------------------------------------------------------------------------------- Clinic Level of Care Assessment Details Patient Name: Shane Bean Date of Service: 05/18/2020 8:30 AM Medical Record Number: [id number] Patient Account Number: [id number] Date of Birth/Sex: 06-26-1955 (64  y.o. M) Treating RN: Donnamarie Poag Primary Care Takako Minckler: Reeves Dam Other Clinician: Referring Caio Devera: Duffy Bruce Treating Marshun Duva/Extender: Skipper Cliche in Treatment: 0 Clinic Level of Care Assessment Items TOOL 1 Quantity Score []  - Use when EandM and Procedure is performed on INITIAL visit 0 ASSESSMENTS - Nursing Assessment / Reassessment X - General Physical Exam (combine w/ comprehensive assessment (listed just below) when performed on new 1 20 pt. evals) X- 1 25 Comprehensive Assessment (HX, ROS, Risk Assessments, Wounds Hx, etc.) ASSESSMENTS - Wound and Skin Assessment / Reassessment []  - Dermatologic / Skin Assessment (not related to wound area) 0 ASSESSMENTS - Ostomy and/or Continence Assessment and Care []  - Incontinence Assessment and Management 0 []  - 0 Ostomy Care Assessment and Management (repouching, etc.) PROCESS - Coordination of Care X - Simple Patient / Family Education for ongoing care 1 15 []  - 0 Complex (extensive) Patient / Family Education for ongoing care X- 1 10 Staff obtains Programmer, systems, Records, Test Results / Process Orders []  - 0 Staff telephones HHA, Nursing Homes / Clarify orders / etc []  - 0 Routine Transfer to another Facility (non-emergent condition) []  - 0 Routine Hospital Admission (non-emergent condition) X- 1 15 New Admissions / Biomedical engineer / Ordering NPWT, Apligraf, etc. []  - 0 Emergency Hospital Admission (emergent condition) PROCESS - Special Needs []  - Pediatric / Minor Patient Management 0 []  - 0 Isolation Patient Management []  - 0 Hearing / Language / Visual special needs []  - 0 Assessment of Community assistance (transportation, D/C planning, etc.) []  - 0 Additional assistance / Altered mentation []  - 0 Support Surface(s) Assessment (bed, cushion, seat, etc.) INTERVENTIONS - Miscellaneous []  - External ear exam 0 []  - 0 Patient Transfer (multiple staff / Civil Service fast streamer / Similar devices) []  -  0 Simple Staple / Suture removal (25 or less) []  - 0 Complex Staple / Suture removal (26 or more) []  - 0 Hypo/Hyperglycemic Management (do not check if billed separately) X- 1 15 Ankle / Brachial Index (ABI) - do not check if billed separately Has the patient been seen at the hospital within the last three years: Yes Total  Score: 100 Level Of Care: New/Established - Level 3 Shane Bean, Shane Bean ([id number]) Electronic Signature(s) Signed: 05/19/2020 2:35:55 PM By: Donnamarie Poag Entered By: Donnamarie Poag on 05/18/2020 09:44:54 Shane Bean ([id number]) -------------------------------------------------------------------------------- Compression Therapy Details Patient Name: Shane Bean Date of Service: 05/18/2020 8:30 AM Medical Record Number: [id number] Patient Account Number: [id number] Date of Birth/Sex: 15-Sep-1955 (64 y.o. M) Treating RN: Donnamarie Poag Primary Care Alleya Demeter: Reeves Dam Other Clinician: Referring Othar Curto: Duffy Bruce Treating Zayan Delvecchio/Extender: Skipper Cliche in Treatment: 0 Compression Therapy Performed for Wound Assessment: Wound #1 Left,Posterior Lower Leg Performed By: Clinician Donnamarie Poag, RN Compression Type: Three Layer Post Procedure Diagnosis Same as Pre-procedure Electronic Signature(s) Signed: 05/19/2020 2:35:55 PM By: Donnamarie Poag Entered By: Donnamarie Poag on 05/18/2020 09:46:07 Shane Bean ([id number]) -------------------------------------------------------------------------------- Encounter Discharge Information Details Patient Name: Shane Bean Date of Service: 05/18/2020 8:30 AM Medical Record Number: [id number] Patient Account Number: [id number] Date of Birth/Sex: 1955-07-02 (64 y.o. M) Treating RN: Donnamarie Poag Primary Care Juanette Urizar: Reeves Dam Other Clinician: Jeanine Luz Referring Kaysen Sefcik: Duffy Bruce Treating Marieme Mcmackin/Extender: Skipper Cliche in Treatment: 0 Encounter Discharge Information  Items Discharge Condition: Stable Ambulatory Status: Ambulatory Discharge Destination: Home Transportation: Private Auto Accompanied By: self Schedule Follow-up Appointment: Yes Clinical Summary of Care: Electronic Signature(s) Signed: 05/18/2020 5:31:07 PM By: Jeanine Luz Entered By: Jeanine Luz on 05/18/2020 10:06:19 Shane Bean ([id number]) -------------------------------------------------------------------------------- Lower Extremity Assessment Details Patient Name: Shane Bean Date of Service: 05/18/2020 8:30 AM Medical Record Number: [id number] Patient Account Number: [id number] Date of Birth/Sex: 04/16/1955 (64 y.o. M) Treating RN: Carlene Coria Primary Care Meriah Shands: Reeves Dam Other Clinician: Referring Yazmin Locher: Duffy Bruce Treating Darric Plante/Extender: Skipper Cliche in Treatment: 0 Edema Assessment Assessed: [Left: No] [Right: No] Edema: [Left: Ye] [Right: s] Calf Left: Right: Point of Measurement: 39 cm From Medial Instep 41 cm Ankle Left: Right: Point of Measurement: 10 cm From Medial Instep 26 cm Knee To Floor Left: Right: From Medial Instep 45 cm Vascular Assessment Pulses: Dorsalis Pedis Palpable: [Left:Yes] Doppler Audible: [Left:Yes] Blood Pressure: Brachial: [Left:120] Ankle: [Left:Dorsalis Pedis: 170 1.42] Electronic Signature(s) Signed: 05/18/2020 8:20:21 PM By: Carlene Coria RN Entered By: Carlene Coria on 05/18/2020 09:22:43 Shane Bean ([id number]) -------------------------------------------------------------------------------- Multi Wound Chart Details Patient Name: Shane Bean Date of Service: 05/18/2020 8:30 AM Medical Record Number: [id number] Patient Account Number: [id number] Date of Birth/Sex: 1955-01-23 (64 y.o. M) Treating RN: Donnamarie Poag Primary Care Denys Salinger: Reeves Dam Other Clinician: Referring Suvan Stcyr: Duffy Bruce Treating Ashyr Hedgepath/Extender: Skipper Cliche in Treatment:  0 Vital Signs Height(in): 70 Pulse(bpm): 74 Weight(lbs): 290 Blood Pressure(mmHg): 120/74 Body Mass Index(BMI): 42 Temperature(F): 98.4 Respiratory Rate(breaths/min): 18 Photos: [N/A:N/A] Wound Location: Left, Posterior Lower Leg N/A N/A Wounding Event: Gradually Appeared N/A N/A Primary Etiology: Diabetic Wound/Ulcer of the Lower N/A N/A Extremity Comorbid History: Hypertension, Type II Diabetes, N/A N/A Neuropathy Date Acquired: 04/08/2020 N/A N/A Weeks of Treatment: 0 N/A N/A Wound Status: Open N/A N/A Measurements L x W x D (cm) 4x3.5x0.1 N/A N/A Area (cm) : 10.996 N/A N/A Volume (cm) : 1.1 N/A N/A % Reduction in Area: 0.00% N/A N/A % Reduction in Volume: 0.00% N/A N/A Classification: Grade 2 N/A N/A Exudate Amount: Small N/A N/A Exudate Type: Serosanguineous N/A N/A Exudate Color: red, brown N/A N/A Granulation Amount: None Present (0%) N/A N/A Necrotic Amount: Large (67-100%) N/A N/A Necrotic Tissue: Eschar, Adherent Slough N/A N/A Exposed Structures: Fat Layer (Subcutaneous Tissue): N/A N/A Yes Fascia: No Tendon: No Muscle: No Joint: No Bone:  No Epithelialization: None N/A N/A Treatment Notes Electronic Signature(s) Signed: 05/19/2020 2:35:55 PM By: Donnamarie Poag Entered By: Donnamarie Poag on 05/18/2020 09:43:10 Shane Bean ([id number]) -------------------------------------------------------------------------------- [place] Details Patient Name: Shane Bean Date of Service: 05/18/2020 8:30 AM Medical Record Number: [id number] Patient Account Number: [id number] Date of Birth/Sex: 1955-10-03 (64 y.o. M) Treating RN: Donnamarie Poag Primary Care Miley Blanchett: Reeves Dam Other Clinician: Referring Ramere Downs: Duffy Bruce Treating Britian Jentz/Extender: Skipper Cliche in Treatment: 0 Active Inactive Nutrition Nursing Diagnoses: Imbalanced nutrition Impaired glucose control: actual or potential Potential for alteratiion in  Nutrition/Potential for imbalanced nutrition Goals: Patient/caregiver agrees to and verbalizes understanding of need to obtain nutritional consultation Date Initiated: 05/18/2020 Target Resolution Date: 06/19/2020 Goal Status: Active Interventions: Assess HgA1c results as ordered upon admission and as needed Assess patient nutrition upon admission and as needed per policy Provide education on elevated blood sugars and impact on wound healing Notes: Orientation to the Wound Care Program Nursing Diagnoses: Knowledge deficit related to the wound healing center program Goals: Patient/caregiver will verbalize understanding of the [place] Date Initiated: 05/18/2020 Target Resolution Date: 06/15/2020 Goal Status: Active Interventions: Provide education on orientation to the wound center Notes: Wound/Skin Impairment Nursing Diagnoses: Impaired tissue integrity Knowledge deficit related to smoking impact on wound healing Knowledge deficit related to ulceration/compromised skin integrity Goals: Patient/caregiver will verbalize understanding of skin care regimen Date Initiated: 05/18/2020 Target Resolution Date: 06/19/2020 Goal Status: Active Ulcer/skin breakdown will have a volume reduction of 30% by week 4 Date Initiated: 05/18/2020 Target Resolution Date: 06/15/2020 Goal Status: Active Ulcer/skin breakdown will have a volume reduction of 50% by week 8 Date Initiated: 05/18/2020 Target Resolution Date: 07/13/2020 Goal Status: Active Ulcer/skin breakdown will have a volume reduction of 80% by week 12 Date Initiated: 05/18/2020 Target Resolution Date: 08/10/2020 Shane Bean, Shane Bean ([id number]) Goal Status: Active Interventions: Assess patient/caregiver ability to obtain necessary supplies Assess patient/caregiver ability to perform ulcer/skin care regimen upon admission and as needed Assess ulceration(s) every visit Notes: Electronic Signature(s) Signed: 05/19/2020  2:35:55 PM By: Donnamarie Poag Entered By: Donnamarie Poag on 05/18/2020 09:42:47 Shane Bean ([id number]) -------------------------------------------------------------------------------- Pain Assessment Details Patient Name: Shane Bean Date of Service: 05/18/2020 8:30 AM Medical Record Number: [id number] Patient Account Number: [id number] Date of Birth/Sex: 08-24-1955 (64 y.o. M) Treating RN: Carlene Coria Primary Care Cortnie Ringel: Reeves Dam Other Clinician: Referring Kelsea Mousel: Duffy Bruce Treating Ransom Nickson/Extender: Skipper Cliche in Treatment: 0 Active Problems Location of Pain Severity and Description of Pain Patient Has Paino Yes Site Locations With Dressing Change: Yes Duration of the Pain. Constant / Intermittento Intermittent How Long Does it Lasto Hours: Minutes: 15 Rate the pain. Current Pain Level: 6 Worst Pain Level: 7 Least Pain Level: 0 Tolerable Pain Level: 5 Character of Pain Describe the Pain: Tender Pain Management and Medication Current Pain Management: Medication: No Cold Application: No Rest: Yes Massage: No Activity: No T.E.N.S.: No Heat Application: No Leg drop or elevation: No Is the Current Pain Management Adequate: Inadequate How does your wound impact your activities of daily livingo Sleep: No Bathing: No Appetite: No Relationship With Others: No Bladder Continence: No Emotions: No Bowel Continence: No Work: No Toileting: No Drive: No Dressing: No Hobbies: No Electronic Signature(s) Signed: 05/18/2020 8:20:21 PM By: Carlene Coria RN Entered By: Carlene Coria on 05/18/2020 09:00:34 Shane Bean ([id number]) -------------------------------------------------------------------------------- Patient/Caregiver Education Details Patient Name: Shane Bean Date of Service: 05/18/2020 8:30 AM Medical Record Number: [id number] Patient Account Number: [id number] Date of  Birth/Gender: December 25, 1955 (65 y.o. M) Treating RN:  Donnamarie Poag Primary Care Physician: Reeves Dam Other Clinician: Referring Physician: Duffy Bruce Treating Physician/Extender: Skipper Cliche in Treatment: 0 Education Assessment Education Provided To: Patient Education Topics Provided Basic Hygiene: Methods: Explain/Verbal Elevated Blood Sugar/ Impact on Healing: Methods: Explain/Verbal Nutrition: Methods: Explain/Verbal, Printed Responses: State content correctly Welcome To [facility]: Methods: Explain/Verbal Responses: State content correctly Wound/Skin Impairment: Methods: Explain/Verbal Responses: State content correctly Electronic Signature(s) Signed: 05/19/2020 2:35:55 PM By: Donnamarie Poag Entered By: Donnamarie Poag on 05/18/2020 09:43:56 Shane Bean ([id number]) -------------------------------------------------------------------------------- Wound Assessment Details Patient Name: Shane Bean Date of Service: 05/18/2020 8:30 AM Medical Record Number: [id number] Patient Account Number: [id number] Date of Birth/Sex: 11-03-1955 (64 y.o. M) Treating RN: Carlene Coria Primary Care Aeisha Minarik: Reeves Dam Other Clinician: Referring Drina Jobst: Duffy Bruce Treating Nakyra Bourn/Extender: Skipper Cliche in Treatment: 0 Wound Status Wound Number: 1 Primary Etiology: Diabetic Wound/Ulcer of the Lower Extremity Wound Location: Left, Posterior Lower Leg Wound Status: Open Wounding Event: Gradually Appeared Comorbid History: Hypertension, Type II Diabetes, Neuropathy Date Acquired: 04/08/2020 Weeks Of Treatment: 0 Clustered Wound: No Photos Wound Measurements Length: (cm) 4 Width: (cm) 3.5 Depth: (cm) 0.1 Area: (cm) 10.996 Volume: (cm) 1.1 % Reduction in Area: 0% % Reduction in Volume: 0% Epithelialization: None Tunneling: No Undermining: No Wound Description Classification: Grade 2 Exudate Amount: Small Exudate Type: Serosanguineous Exudate Color: red, brown Foul Odor After  Cleansing: No Slough/Fibrino Yes Wound Bed Granulation Amount: None Present (0%) Exposed Structure Necrotic Amount: Large (67-100%) Fascia Exposed: No Necrotic Quality: Eschar, Adherent Slough Fat Layer (Subcutaneous Tissue) Exposed: Yes Tendon Exposed: No Muscle Exposed: No Joint Exposed: No Bone Exposed: No Treatment Notes Wound #1 (Lower Leg) Wound Laterality: Left, Posterior Cleanser Soap and Water Discharge Instruction: Gently cleanse wound with antibacterial soap, rinse and pat dry prior to dressing wounds Peri-Wound Care Shane Bean, Shane Bean ([id number]) Topical Primary Dressing Iodosorb 40 (g) Discharge Instruction: Apply IodoSorb to wound bed only as directed. Secondary Dressing ABD Pad 5x9 (in/in) Discharge Instruction: Cover with ABD pad Secured With Compression Wrap Profore Lite LF 3 Multilayer Compression Bandaging System Discharge Instruction: Apply 3 multi-layer wrap as prescribed. Compression Stockings Add-Ons Electronic Signature(s) Signed: 05/18/2020 9:37:11 AM By: Worthy Keeler PA-C Signed: 05/18/2020 8:20:21 PM By: Carlene Coria RN Entered By: Worthy Keeler on 05/18/2020 09:37:11 Shane Bean ([id number]) -------------------------------------------------------------------------------- Vitals Details Patient Name: Shane Bean Date of Service: 05/18/2020 8:30 AM Medical Record Number: [id number] Patient Account Number: [id number] Date of Birth/Sex: Nov 23, 1955 (64 y.o. M) Treating RN: Carlene Coria Primary Care Evelise Reine: Reeves Dam Other Clinician: Referring Lajuanda Penick: Duffy Bruce Treating Omarrion Carmer/Extender: Skipper Cliche in Treatment: 0 Vital Signs Time Taken: 09:00 Temperature (F): 98.4 Height (in): 70 Pulse (bpm): 74 Source: Stated Respiratory Rate (breaths/min): 18 Weight (lbs): 290 Blood Pressure (mmHg): 120/74 Source: Stated Reference Range: 80 - 120 mg / dl Body Mass Index (BMI): 41.6 Electronic  Signature(s) Signed: 05/18/2020 8:20:21 PM By: Carlene Coria RN Entered By: Carlene Coria on 05/18/2020 09:01:02

## 2020-05-21 ENCOUNTER — Other Ambulatory Visit: Payer: Self-pay

## 2020-05-21 DIAGNOSIS — E11622 Type 2 diabetes mellitus with other skin ulcer: Secondary | ICD-10-CM | POA: Diagnosis not present

## 2020-05-21 NOTE — Progress Notes (Signed)
REINER, LOEWEN ([id number]) Visit Report for 05/21/2020 Arrival Information Details Patient Name: Shane Bean, Shane Bean Date of Service: 05/21/2020 11:30 AM Medical Record Number: [id number] Patient Account Number: [id number] Date of Birth/Sex: 09-Aug-1955 (65 y.o. M) Treating RN: Donnamarie Poag Primary Care Charrisse Masley: Reeves Dam Other Clinician: Referring Emersyn Kotarski: Reeves Dam Treating Kidada Ging/Extender: Skipper Cliche in Treatment: 0 Visit Information History Since Last Visit Has Dressing in Place as Prescribed: Yes Patient Arrived: Ambulatory Has Compression in Place as Prescribed: Yes Arrival Time: 11:27 Pain Present Now: Yes Accompanied By: self Transfer Assistance: None Patient Identification Verified: Yes Patient Requires Transmission-Based No Precautions: Patient Has Alerts: Yes Patient Alerts: Patient on Blood Thinner DIABETIC Electronic Signature(s) Signed: 05/21/2020 11:43:10 AM By: Donnamarie Poag Entered By: Donnamarie Poag on 05/21/2020 11:27:34 Shane Bean ([id number]) -------------------------------------------------------------------------------- Clinic Level of Care Assessment Details Patient Name: Shane Bean Date of Service: 05/21/2020 11:30 AM Medical Record Number: [id number] Patient Account Number: [id number] Date of Birth/Sex: March 07, 1955 (65 y.o. M) Treating RN: Donnamarie Poag Primary Care Rocky Rishel: Reeves Dam Other Clinician: Referring Ceilidh Torregrossa: Reeves Dam Treating Jatniel Verastegui/Extender: Skipper Cliche in Treatment: 0 Clinic Level of Care Assessment Items TOOL 1 Quantity Score []  - Use when EandM and Procedure is performed on INITIAL visit 0 ASSESSMENTS - Nursing Assessment / Reassessment []  - General Physical Exam (combine w/ comprehensive assessment (listed just below) when performed on new 0 pt. evals) []  - 0 Comprehensive Assessment (HX, ROS, Risk Assessments, Wounds Hx, etc.) ASSESSMENTS - Wound and Skin Assessment /  Reassessment []  - Dermatologic / Skin Assessment (not related to wound area) 0 ASSESSMENTS - Ostomy and/or Continence Assessment and Care []  - Incontinence Assessment and Management 0 []  - 0 Ostomy Care Assessment and Management (repouching, etc.) PROCESS - Coordination of Care []  - Simple Patient / Family Education for ongoing care 0 []  - 0 Complex (extensive) Patient / Family Education for ongoing care []  - 0 Staff obtains Programmer, systems, Records, Test Results / Process Orders []  - 0 Staff telephones HHA, Nursing Homes / Clarify orders / etc []  - 0 Routine Transfer to another Facility (non-emergent condition) []  - 0 Routine Hospital Admission (non-emergent condition) []  - 0 New Admissions / Biomedical engineer / Ordering NPWT, Apligraf, etc. []  - 0 Emergency Hospital Admission (emergent condition) PROCESS - Special Needs []  - Pediatric / Minor Patient Management 0 []  - 0 Isolation Patient Management []  - 0 Hearing / Language / Visual special needs []  - 0 Assessment of Community assistance (transportation, D/C planning, etc.) []  - 0 Additional assistance / Altered mentation []  - 0 Support Surface(s) Assessment (bed, cushion, seat, etc.) INTERVENTIONS - Miscellaneous []  - External ear exam 0 []  - 0 Patient Transfer (multiple staff / Civil Service fast streamer / Similar devices) []  - 0 Simple Staple / Suture removal (25 or less) []  - 0 Complex Staple / Suture removal (26 or more) []  - 0 Hypo/Hyperglycemic Management (do not check if billed separately) []  - 0 Ankle / Brachial Index (ABI) - do not check if billed separately Has the patient been seen at the hospital within the last three years: Yes Total Score: 0 Level Of Care: ____ Shane Bean ([id number]) Electronic Signature(s) Signed: 05/21/2020 11:43:10 AM By: Donnamarie Poag Entered By: Donnamarie Poag on 05/21/2020 11:42:05 Shane Bean  ([id number]) -------------------------------------------------------------------------------- Compression Therapy Details Patient Name: Shane Bean Date of Service: 05/21/2020 11:30 AM Medical Record Number: [id number] Patient Account Number: [id number] Date of Birth/Sex: 15-Jun-1955 (65 y.o. M) Treating RN: Donnamarie Poag Primary Care Razan Siler:  Reeves Dam Other Clinician: Referring Iseah Plouff: Reeves Dam Treating Lena Gores/Extender: Skipper Cliche in Treatment: 0 Compression Therapy Performed for Wound Assessment: Wound #1 Left,Posterior Lower Leg Performed By: Clinician Donnamarie Poag, RN Compression Type: Three Layer Electronic Signature(s) Signed: 05/21/2020 11:43:10 AM By: Donnamarie Poag Entered By: Donnamarie Poag on 05/21/2020 11:33:01 Shane Bean ([id number]) -------------------------------------------------------------------------------- Encounter Discharge Information Details Patient Name: Shane Bean Date of Service: 05/21/2020 11:30 AM Medical Record Number: [id number] Patient Account Number: [id number] Date of Birth/Sex: Nov 01, 1955 (65 y.o. M) Treating RN: Donnamarie Poag Primary Care Yakira Duquette: Reeves Dam Other Clinician: Referring Yennifer Segovia: Reeves Dam Treating Jhoanna Heyde/Extender: Skipper Cliche in Treatment: 0 Encounter Discharge Information Items Discharge Condition: Stable Ambulatory Status: Ambulatory Discharge Destination: Home Transportation: Private Auto Accompanied By: self Schedule Follow-up Appointment: Yes Clinical Summary of Care: Electronic Signature(s) Signed: 05/21/2020 11:43:10 AM By: Donnamarie Poag Entered By: Donnamarie Poag on 05/21/2020 11:41:57 Shane Bean ([id number]) -------------------------------------------------------------------------------- Wound Assessment Details Patient Name: Shane Bean Date of Service: 05/21/2020 11:30 AM Medical Record Number: [id number] Patient Account Number: [id number] Date of Birth/Sex:  1955/01/22 (65 y.o. M) Treating RN: Donnamarie Poag Primary Care Jaleal Schliep: Reeves Dam Other Clinician: Referring Sheretha Shadd: Reeves Dam Treating Sagan Maselli/Extender: Skipper Cliche in Treatment: 0 Wound Status Wound Number: 1 Primary Etiology: Diabetic Wound/Ulcer of the Lower Extremity Wound Location: Left, Posterior Lower Leg Wound Status: Open Wounding Event: Gradually Appeared Comorbid History: Hypertension, Type II Diabetes, Neuropathy Date Acquired: 04/08/2020 Weeks Of Treatment: 0 Clustered Wound: No Wound Measurements Length: (cm) 4 Width: (cm) 3.5 Depth: (cm) 0.1 Area: (cm) 10.996 Volume: (cm) 1.1 % Reduction in Area: 0% % Reduction in Volume: 0% Epithelialization: None Wound Description Classification: Grade 2 Exudate Amount: Small Exudate Type: Serosanguineous Exudate Color: red, brown Foul Odor After Cleansing: No Slough/Fibrino Yes Wound Bed Granulation Amount: None Present (0%) Exposed Structure Necrotic Amount: Large (67-100%) Fascia Exposed: No Necrotic Quality: Eschar, Adherent Slough Fat Layer (Subcutaneous Tissue) Exposed: Yes Tendon Exposed: No Muscle Exposed: No Joint Exposed: No Bone Exposed: No Treatment Notes Wound #1 (Lower Leg) Wound Laterality: Left, Posterior Cleanser Soap and Water Discharge Instruction: Gently cleanse wound with antibacterial soap, rinse and pat dry prior to dressing wounds Peri-Wound Care Topical Primary Dressing Iodosorb 40 (g) Discharge Instruction: Apply IodoSorb to wound bed only as directed. Secondary Dressing ABD Pad 5x9 (in/in) Discharge Instruction: Cover with ABD pad Secured With Compression Wrap Profore Lite LF 3 Multilayer Compression Bandaging System Discharge Instruction: Apply 3 multi-layer wrap as prescribed. DWIGHT, ADAMCZAK ([id number]) Compression Stockings Add-Ons Electronic Signature(s) Signed: 05/21/2020 11:43:10 AM By: Donnamarie Poag Entered ByDonnamarie Poag on 05/21/2020 11:30:18

## 2020-05-27 ENCOUNTER — Other Ambulatory Visit: Payer: Self-pay

## 2020-05-27 ENCOUNTER — Encounter: Payer: Medicare (Managed Care) | Admitting: Internal Medicine

## 2020-05-27 DIAGNOSIS — E11622 Type 2 diabetes mellitus with other skin ulcer: Secondary | ICD-10-CM | POA: Diagnosis not present

## 2020-05-28 NOTE — Progress Notes (Signed)
Shane Bean, Shane Bean ([id number]) Visit Report for 05/27/2020 Arrival Information Details Patient Name: Shane Bean, Shane Bean Date of Service: 05/27/2020 12:30 PM Medical Record Number: [id number] Patient Account Number: [id number] Date of Birth/Sex: 03/23/1955 (65 y.o. M) Treating RN: Cornell Barman Primary Care Aseret Hoffman: Reeves Dam Other Clinician: Referring Quinnten Calvin: Reeves Dam Treating Briston Lax/Extender: Tito Dine in Treatment: 1 Visit Information History Since Last Visit Added or deleted any medications: No Patient Arrived: Shane Bean Had a fall or experienced change in No Arrival Time: 12:39 activities of daily living that may affect Accompanied By: self risk of falls: Transfer Assistance: None Hospitalized since last visit: No Patient Identification Verified: Yes Pain Present Now: Yes Secondary Verification Process Completed: Yes Patient Requires Transmission-Based No Precautions: Patient Has Alerts: Yes Patient Alerts: Patient on Blood Thinner DIABETIC Electronic Signature(s) Signed: 05/28/2020 4:37:07 PM By: Jeanine Luz Entered By: Jeanine Luz on 05/27/2020 12:42:58 Shane Bean ([id number]) -------------------------------------------------------------------------------- Clinic Level of Care Assessment Details Patient Name: Shane Bean Date of Service: 05/27/2020 12:30 PM Medical Record Number: [id number] Patient Account Number: [id number] Date of Birth/Sex: 1955/03/11 (65 y.o. M) Treating RN: Cornell Barman Primary Care Shivali Quackenbush: Reeves Dam Other Clinician: Referring Ayrabella Labombard: Reeves Dam Treating Eagan Shifflett/Extender: Tito Dine in Treatment: 1 Clinic Level of Care Assessment Items TOOL 1 Quantity Score []  - Use when EandM and Procedure is performed on INITIAL visit 0 ASSESSMENTS - Nursing Assessment / Reassessment []  - General Physical Exam (combine w/ comprehensive assessment (listed just below) when performed on  new 0 pt. evals) []  - 0 Comprehensive Assessment (HX, ROS, Risk Assessments, Wounds Hx, etc.) ASSESSMENTS - Wound and Skin Assessment / Reassessment []  - Dermatologic / Skin Assessment (not related to wound area) 0 ASSESSMENTS - Ostomy and/or Continence Assessment and Care []  - Incontinence Assessment and Management 0 []  - 0 Ostomy Care Assessment and Management (repouching, etc.) PROCESS - Coordination of Care []  - Simple Patient / Family Education for ongoing care 0 []  - 0 Complex (extensive) Patient / Family Education for ongoing care []  - 0 Staff obtains Programmer, systems, Records, Test Results / Process Orders []  - 0 Staff telephones HHA, Nursing Homes / Clarify orders / etc []  - 0 Routine Transfer to another Facility (non-emergent condition) []  - 0 Routine Hospital Admission (non-emergent condition) []  - 0 New Admissions / Biomedical engineer / Ordering NPWT, Apligraf, etc. []  - 0 Emergency Hospital Admission (emergent condition) PROCESS - Special Needs []  - Pediatric / Minor Patient Management 0 []  - 0 Isolation Patient Management []  - 0 Hearing / Language / Visual special needs []  - 0 Assessment of Community assistance (transportation, D/C planning, etc.) []  - 0 Additional assistance / Altered mentation []  - 0 Support Surface(s) Assessment (bed, cushion, seat, etc.) INTERVENTIONS - Miscellaneous []  - External ear exam 0 []  - 0 Patient Transfer (multiple staff / Civil Service fast streamer / Similar devices) []  - 0 Simple Staple / Suture removal (25 or less) []  - 0 Complex Staple / Suture removal (26 or more) []  - 0 Hypo/Hyperglycemic Management (do not check if billed separately) []  - 0 Ankle / Brachial Index (ABI) - do not check if billed separately Has the patient been seen at the hospital within the last three years: Yes Total Score: 0 Level Of Care: ____ Shane Bean ([id number]) Electronic Signature(s) Signed: 05/28/2020 9:33:55 AM By: Gretta Cool, BSN, RN, CWS, Kim RN,  BSN Entered By: Gretta Cool, BSN, RN, CWS, Kim on 05/27/2020 13:32:55 Shane Bean ([id number]) -------------------------------------------------------------------------------- Encounter Discharge Information Details Patient Name:  Shane Bean Date of Service: 05/27/2020 12:30 PM Medical Record Number: [id number] Patient Account Number: [id number] Date of Birth/Sex: June 28, 1955 (65 y.o. M) Treating RN: Dolan Amen Primary Care Mickey Hebel: Reeves Dam Other Clinician: Referring Jhanae Jaskowiak: Reeves Dam Treating Claramae Rigdon/Extender: Tito Dine in Treatment: 1 Encounter Discharge Information Items Post Procedure Vitals Discharge Condition: Stable Temperature (F): 98.3 Ambulatory Status: Ambulatory Pulse (bpm): 71 Discharge Destination: Home Respiratory Rate (breaths/min): 18 Transportation: Private Auto Blood Pressure (mmHg): 130/76 Accompanied By: self Schedule Follow-up Appointment: Yes Clinical Summary of Care: Electronic Signature(s) Signed: 05/27/2020 2:36:15 PM By: Georges Mouse, Minus Breeding RN Entered By: Georges Mouse, Minus Breeding on 05/27/2020 14:36:15 Shane Bean ([id number]) -------------------------------------------------------------------------------- Lower Extremity Assessment Details Patient Name: Shane Bean Date of Service: 05/27/2020 12:30 PM Medical Record Number: [id number] Patient Account Number: [id number] Date of Birth/Sex: [birth year] (65 y.o. M) Treating RN: Cornell Barman Primary Care Rami Waddle: Reeves Dam Other Clinician: Referring Nysia Dell: Reeves Dam Treating Chailyn Racette/Extender: Tito Dine in Treatment: 1 Edema Assessment Assessed: [Left: Yes] [Right: No] Edema: [Left: Ye] [Right: s] Calf Left: Right: Point of Measurement: 39 cm From Medial Instep 42 cm Ankle Left: Right: Point of Measurement: 10 cm From Medial Instep 26 cm Vascular Assessment Pulses: Dorsalis Pedis Palpable: [Left:Yes] Electronic  Signature(s) Signed: 05/28/2020 9:33:55 AM By: Gretta Cool, BSN, RN, CWS, Kim RN, BSN Signed: 05/28/2020 4:37:07 PM By: Jeanine Luz Entered By: Jeanine Luz on 05/27/2020 12:59:06 Shane Bean ([id number]) -------------------------------------------------------------------------------- Multi Wound Chart Details Patient Name: Shane Bean Date of Service: 05/27/2020 12:30 PM Medical Record Number: [id number] Patient Account Number: [id number] Date of Birth/Sex: [birth year] (64 y.o. M) Treating RN: Cornell Barman Primary Care Torence Palmeri: Reeves Dam Other Clinician: Referring Axelle Szwed: Reeves Dam Treating Klover Priestly/Extender: Tito Dine in Treatment: 1 Vital Signs Height(in): 70 Pulse(bpm): 88 Weight(lbs): 290 Blood Pressure(mmHg): 130/76 Body Mass Index(BMI): 42 Temperature(F): 98.3 Respiratory Rate(breaths/min): 18 Photos: [N/A:N/A] Wound Location: Left, Posterior Lower Leg N/A N/A Wounding Event: Gradually Appeared N/A N/A Primary Etiology: Diabetic Wound/Ulcer of the Lower N/A N/A Extremity Comorbid History: Hypertension, Type II Diabetes, N/A N/A Neuropathy Date Acquired: 04/08/2020 N/A N/A Weeks of Treatment: 1 N/A N/A Wound Status: Open N/A N/A Measurements L x W x D (cm) 3x3.8x0.1 N/A N/A Area (cm) : 8.954 N/A N/A Volume (cm) : 0.895 N/A N/A % Reduction in Area: 18.60% N/A N/A % Reduction in Volume: 18.60% N/A N/A Classification: Grade 2 N/A N/A Exudate Amount: Small N/A N/A Exudate Type: Serosanguineous N/A N/A Exudate Color: red, brown N/A N/A Granulation Amount: None Present (0%) N/A N/A Necrotic Amount: Large (67-100%) N/A N/A Necrotic Tissue: Eschar N/A N/A Exposed Structures: Fat Layer (Subcutaneous Tissue): N/A N/A Yes Fascia: No Tendon: No Muscle: No Joint: No Bone: No Epithelialization: None N/A N/A Debridement: Debridement - Excisional N/A N/A Pre-procedure Verification/Time 13:05 N/A N/A Out Taken: Tissue Debrided:  Necrotic/Eschar, Subcutaneous, N/A N/A Slough Level: Skin/Subcutaneous Tissue N/A N/A Debridement Area (sq cm): 11.4 N/A N/A Instrument: Curette N/A N/A Bleeding: Minimum N/A N/A Hemostasis Achieved: Pressure N/A N/A Debridement Treatment Procedure was tolerated well N/A N/A Response: Post Debridement 3x3.8x0.1 N/A N/A Measurements L x W x D (cm) Shane Bean, Shane Bean ([id number]) Post Debridement Volume: 0.895 N/A N/A (cm) Procedures Performed: Debridement N/A N/A Treatment Notes Electronic Signature(s) Signed: 05/28/2020 8:39:06 AM By: Linton Ham MD Entered By: Linton Ham on 05/27/2020 13:12:20 Shane Bean ([id number]) -------------------------------------------------------------------------------- Multi-Disciplinary Care Plan Details Patient Name: Shane Bean Date of Service: 05/27/2020 12:30 PM Medical Record Number: [id number] Patient Account  Number: [id number] Date of Birth/Sex: January 30, 1955 (65 y.o. M) Treating RN: Cornell Barman Primary Care Janice Seales: Reeves Dam Other Clinician: Referring Meilah Delrosario: Reeves Dam Treating Stephnie Parlier/Extender: Tito Dine in Treatment: 1 Active Inactive Nutrition Nursing Diagnoses: Imbalanced nutrition Impaired glucose control: actual or potential Potential for alteratiion in Nutrition/Potential for imbalanced nutrition Goals: Patient/caregiver agrees to and verbalizes understanding of need to obtain nutritional consultation Date Initiated: 05/18/2020 Target Resolution Date: 06/19/2020 Goal Status: Active Interventions: Assess HgA1c results as ordered upon admission and as needed Assess patient nutrition upon admission and as needed per policy Provide education on elevated blood sugars and impact on wound healing Notes: Orientation to the Wound Care Program Nursing Diagnoses: Knowledge deficit related to the wound healing center program Goals: Patient/caregiver will verbalize understanding of the [place] Date Initiated: 05/18/2020 Target Resolution Date: 06/15/2020 Goal Status: Active Interventions: Provide education on orientation to the wound center Notes: Wound/Skin Impairment Nursing Diagnoses: Impaired tissue integrity Knowledge deficit related to smoking impact on wound healing Knowledge deficit related to ulceration/compromised skin integrity Goals: Patient/caregiver will verbalize understanding of skin care regimen Date Initiated: 05/18/2020 Target Resolution Date: 06/19/2020 Goal Status: Active Ulcer/skin breakdown will have a volume reduction of 30% by week 4 Date Initiated: 05/18/2020 Target Resolution Date: 06/15/2020 Goal Status: Active Ulcer/skin breakdown will have a volume reduction of 50% by week 8 Date Initiated: 05/18/2020 Target Resolution Date: 07/13/2020 Goal Status: Active Ulcer/skin breakdown will have a volume reduction of 80% by week 12 Date Initiated: 05/18/2020 Target Resolution Date: 08/10/2020 Shane Bean, Shane Bean ([id number]) Goal Status: Active Interventions: Assess patient/caregiver ability to obtain necessary supplies Assess patient/caregiver ability to perform ulcer/skin care regimen upon admission and as needed Assess ulceration(s) every visit Notes: Electronic Signature(s) Signed: 05/28/2020 9:33:55 AM By: Gretta Cool, BSN, RN, CWS, Kim RN, BSN Entered By: Gretta Cool, BSN, RN, CWS, Kim on 05/27/2020 13:04:19 Shane Bean ([id number]) -------------------------------------------------------------------------------- Pain Assessment Details Patient Name: Shane Bean Date of Service: 05/27/2020 12:30 PM Medical Record Number: [id number] Patient Account Number: [id number] Date of Birth/Sex: 1955/09/14 (64 y.o. M) Treating RN: Cornell Barman Primary Care Akera Snowberger: Reeves Dam Other Clinician: Referring Kazuto Sevey: Reeves Dam Treating Nelle Sayed/Extender: Tito Dine in Treatment: 1 Active Problems Location of Pain  Severity and Description of Pain Patient Has Paino Yes Site Locations Rate the pain. Current Pain Level: 9 Character of Pain Describe the Pain: Stabbing, Tiring Pain Management and Medication Current Pain Management: Electronic Signature(s) Signed: 05/28/2020 9:33:55 AM By: Gretta Cool, BSN, RN, CWS, Kim RN, BSN Signed: 05/28/2020 4:37:07 PM By: Jeanine Luz Entered By: Jeanine Luz on 05/27/2020 12:45:05 Shane Bean ([id number]) -------------------------------------------------------------------------------- Patient/Caregiver Education Details Patient Name: Shane Bean Date of Service: 05/27/2020 12:30 PM Medical Record Number: [id number] Patient Account Number: [id number] Date of Birth/Gender: 1955-09-30 (64 y.o. M) Treating RN: Cornell Barman Primary Care Physician: Reeves Dam Other Clinician: Referring Physician: Reeves Dam Treating Physician/Extender: Tito Dine in Treatment: 1 Education Assessment Education Provided To: Patient Education Topics Provided Wound Debridement: Handouts: Wound Debridement Methods: Demonstration, Explain/Verbal Responses: State content correctly Wound/Skin Impairment: Handouts: Caring for Your Ulcer Methods: Demonstration, Explain/Verbal Responses: State content correctly Electronic Signature(s) Signed: 05/28/2020 9:33:55 AM By: Gretta Cool, BSN, RN, CWS, Kim RN, BSN Entered By: Gretta Cool, BSN, RN, CWS, Kim on 05/27/2020 13:33:29 Shane Bean ([id number]) -------------------------------------------------------------------------------- Wound Assessment Details Patient Name: Shane Bean Date of Service: 05/27/2020 12:30 PM Medical Record Number: [id number] Patient Account Number: [id number] Date of Birth/Sex: 04-05-1955 (64 y.o. M) Treating RN: Gretta Cool,  [place] Primary Care Shamarion Coots: Reeves Dam Other Clinician: Referring Hadas Jessop: Reeves Dam Treating Arin Vanosdol/Extender: Tito Dine in Treatment:  1 Wound Status Wound Number: 1 Primary Etiology: Diabetic Wound/Ulcer of the Lower Extremity Wound Location: Left, Posterior Lower Leg Wound Status: Open Wounding Event: Gradually Appeared Comorbid History: Hypertension, Type II Diabetes, Neuropathy Date Acquired: 04/08/2020 Weeks Of Treatment: 1 Clustered Wound: No Photos Wound Measurements Length: (cm) 3 Width: (cm) 3.8 Depth: (cm) 0.1 Area: (cm) 8.954 Volume: (cm) 0.895 % Reduction in Area: 18.6% % Reduction in Volume: 18.6% Epithelialization: None Tunneling: No Undermining: No Wound Description Classification: Grade 2 Exudate Amount: Small Exudate Type: Serosanguineous Exudate Color: red, brown Foul Odor After Cleansing: No Slough/Fibrino Yes Wound Bed Granulation Amount: None Present (0%) Exposed Structure Necrotic Amount: Large (67-100%) Fascia Exposed: No Necrotic Quality: Eschar Fat Layer (Subcutaneous Tissue) Exposed: Yes Tendon Exposed: No Muscle Exposed: No Joint Exposed: No Bone Exposed: No Treatment Notes Wound #1 (Lower Leg) Wound Laterality: Left, Posterior Cleanser Soap and Water Discharge Instruction: Gently cleanse wound with antibacterial soap, rinse and pat dry prior to dressing wounds Peri-Wound Care Shane Bean, Shane Bean ([id number]) Topical Primary Dressing Iodosorb 40 (g) Discharge Instruction: Apply IodoSorb to wound bed only as directed. Secondary Dressing ABD Pad 5x9 (in/in) Discharge Instruction: Cover with ABD pad Secured With Compression Wrap Profore Lite LF 3 Multilayer Compression Bandaging System Discharge Instruction: Apply 3 multi-layer wrap as prescribed. Compression Stockings Add-Ons Electronic Signature(s) Signed: 05/28/2020 9:33:55 AM By: Gretta Cool, BSN, RN, CWS, Kim RN, BSN Signed: 05/28/2020 4:37:07 PM By: Jeanine Luz Entered By: Jeanine Luz on 05/27/2020 12:56:52 Shane Bean  ([id number]) -------------------------------------------------------------------------------- Vitals Details Patient Name: Shane Bean Date of Service: 05/27/2020 12:30 PM Medical Record Number: [id number] Patient Account Number: [id number] Date of Birth/Sex: August 25, 1955 (64 y.o. M) Treating RN: Cornell Barman Primary Care Yehoshua Vitelli: Reeves Dam Other Clinician: Referring Ashley Bultema: Reeves Dam Treating Natahlia Hoggard/Extender: Tito Dine in Treatment: 1 Vital Signs Time Taken: 12:42 Temperature (F): 98.3 Height (in): 70 Pulse (bpm): 71 Weight (lbs): 290 Respiratory Rate (breaths/min): 18 Body Mass Index (BMI): 41.6 Blood Pressure (mmHg): 130/76 Reference Range: 80 - 120 mg / dl Electronic Signature(s) Signed: 05/28/2020 4:37:07 PM By: Jeanine Luz Entered By: Jeanine Luz on 05/27/2020 12:44:37

## 2020-05-28 NOTE — Progress Notes (Signed)
Shane Bean, Shane Bean ([id number]) Visit Report for 05/27/2020 Debridement Details Patient Name: Shane Bean, Shane Bean Date of Service: 05/27/2020 12:30 PM Medical Record Number: [id number] Patient Account Number: [id number] Date of Birth/Sex: 08/25/1955 (64 y.o. M) Treating RN: Shane Bean Primary Care Provider: Reeves Dam Other Clinician: Referring Provider: Reeves Dam Treating Provider/Extender: Shane Bean in Treatment: 1 Debridement Performed for Wound #1 Left,Posterior Lower Leg Assessment: Performed By: Physician Shane Dillon, MD Debridement Type: Debridement Severity of Tissue Pre Debridement: Fat layer exposed Level of Consciousness (Pre- Awake and Alert procedure): Pre-procedure Verification/Time Out Yes - 13:05 Taken: Total Area Debrided (L x W): 3 (cm) x 3.8 (cm) = 11.4 (cm) Tissue and other material Viable, Non-Viable, Eschar, Slough, Subcutaneous, Skin: Dermis , Skin: Epidermis, Biofilm, Slough debrided: Level: Skin/Subcutaneous Tissue Debridement Description: Excisional Instrument: Curette Bleeding: Minimum Hemostasis Achieved: Pressure Response to Treatment: Procedure was tolerated well Level of Consciousness (Post- Awake and Alert procedure): Post Debridement Measurements of Total Wound Length: (cm) 3 Width: (cm) 3.8 Depth: (cm) 0.1 Volume: (cm) 0.895 Character of Wound/Ulcer Post Debridement: Stable Severity of Tissue Post Debridement: Fat layer exposed Post Procedure Diagnosis Same as Pre-procedure Electronic Signature(s) Signed: 05/28/2020 8:39:06 AM By: Shane Ham MD Signed: 05/28/2020 9:33:55 AM By: Shane Bean, BSN, RN, CWS, Kim RN, BSN Entered By: Shane Bean on 05/27/2020 13:12:33 Shane Bean ([id number]) -------------------------------------------------------------------------------- HPI Details Patient Name: Shane Bean Date of Service: 05/27/2020 12:30 PM Medical Record Number: [id number] Patient Account  Number: [id number] Date of Birth/Sex: 1955/07/23 (64 y.o. M) Treating RN: Shane Bean Primary Care Provider: Reeves Dam Other Clinician: Referring Provider: Reeves Dam Treating Provider/Extender: Shane Bean in Treatment: 1 History of Present Illness HPI Description: 05/18/2020 upon evaluation today patient appears for initial evaluation here in clinic concerning issues that he has been having with the wound on his left lateral leg. Fortunately there does not appear to be any signs of obvious an active infection at this time which is great news. With that being said the patient unfortunately is continued to have issues with pain although he tells me it is gotten a lot better. He was on Keflex as well as Bactrim DS which seems to have done a good job there. His most recent hemoglobin A1c was 9.8 that was on 04/14/2020. Subsequently I do feel like that the patient is making progress here. He does have a history of chronic venous insufficiency as well as hypertension. I do not see any need for antibiotics at this point. 5/25; follow-up of the wound on the left posterior calf. This is completely necrotic on the surface. It does not look infected but it is painful. He is a diabetic but I think there is some suggestion here of chronic venous disease as well. We used Iodoflex under compression last week Electronic Signature(s) Signed: 05/28/2020 8:39:06 AM By: Shane Ham MD Entered By: Shane Bean on 05/27/2020 13:15:43 Shane Bean ([id number]) -------------------------------------------------------------------------------- Physical Exam Details Patient Name: Shane Bean Date of Service: 05/27/2020 12:30 PM Medical Record Number: [id number] Patient Account Number: [id number] Date of Birth/Sex: 01-13-1955 (64 y.o. M) Treating RN: Shane Bean Primary Care Provider: Reeves Dam Other Clinician: Referring Provider: Reeves Dam Treating Provider/Extender: Shane Bean Weeks in Treatment: 1 Constitutional Sitting or standing Blood Pressure is within target range for patient.. Pulse regular and within target range for patient.Marland Kitchen Respirations regular, non- labored and within target range.. Temperature is normal and within the target range for the patient.Marland Kitchen appears in no distress. Cardiovascular  Pedal pulses are palpable. Notes Wound exam; this week there is absolutely no good granulation on this wound thick adherent black/yellow necrotic surface. I used a #5 curette to try and remove as much of this as I could although he tolerated this very poorly. No clear evidence of infection no surrounding soft tissue crepitus. Electronic Signature(s) Signed: 05/28/2020 8:39:06 AM By: Shane Ham MD Entered By: Shane Bean on 05/27/2020 13:16:45 Shane Bean ([id number]) -------------------------------------------------------------------------------- Physician Orders Details Patient Name: Shane Bean Date of Service: 05/27/2020 12:30 PM Medical Record Number: [id number] Patient Account Number: [id number] Date of Birth/Sex: Feb 25, 1955 (64 y.o. M) Treating RN: Shane Bean Primary Care Provider: Reeves Dam Other Clinician: Referring Provider: Reeves Dam Treating Provider/Extender: Shane Bean in Treatment: 1 Verbal / Phone Orders: No Diagnosis Coding ICD-10 Coding Code Description E11.622 Type 2 diabetes mellitus with other skin ulcer I87.2 Venous insufficiency (chronic) (peripheral) 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 layer exposed I10 Essential (primary) hypertension Follow-up Appointments o Return Appointment in 1 week. o Nurse Visit as needed o Other: - plan nurse visit for first week on Thursday/Friday Bathing/ Shower/ Hygiene o May shower with wound dressing protected with water repellent cover or cast protector. - cover dressing during shower-do not get it  wet Edema Control - Lymphedema / Segmental Compressive Device / Other o Optional: One layer of unna paste to top of compression wrap (to act as an anchor). Wound Treatment Wound #1 - Lower Leg Wound Laterality: Left, Posterior Cleanser: Soap and Water Discharge Instructions: Gently cleanse wound with antibacterial soap, rinse and pat dry prior to dressing wounds Primary Dressing: Iodosorb 40 (g) Discharge Instructions: Apply IodoSorb to wound bed only as directed. Secondary Dressing: ABD Pad 5x9 (in/in) Discharge Instructions: Cover with ABD pad Compression Wrap: Profore Lite LF 3 Multilayer Compression Bandaging System Discharge Instructions: Apply 3 multi-layer wrap as prescribed. Electronic Signature(s) Signed: 05/27/2020 1:32:49 PM By: Shane Bean, BSN, RN, CWS, Kim RN, BSN Signed: 05/28/2020 8:39:06 AM By: Shane Ham MD Entered By: Shane Bean, BSN, RN, CWS, Kim on 05/27/2020 13:32:49 Shane Bean, Shane Bean ([id number]) -------------------------------------------------------------------------------- Problem List Details Patient Name: Shane Bean Date of Service: 05/27/2020 12:30 PM Medical Record Number: [id number] Patient Account Number: [id number] Date of Birth/Sex: 1955-04-19 (64 y.o. M) Treating RN: Shane Bean Primary Care Provider: Reeves Dam Other Clinician: Referring Provider: Reeves Dam Treating Provider/Extender: Shane Bean in Treatment: 1 Active Problems ICD-10 Encounter Code Description Active Date MDM Diagnosis E11.622 Type 2 diabetes mellitus with other skin ulcer 05/18/2020 No Yes I87.2 Venous insufficiency (chronic) (peripheral) 05/18/2020 No Yes 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 layer 05/18/2020 No Yes exposed [place] (primary) hypertension 05/18/2020 No Yes Inactive Problems Resolved Problems Electronic Signature(s) Signed: 05/28/2020 8:39:06 AM By: Shane Ham MD Entered By: Shane Bean  on 05/27/2020 13:12:12 Shane Bean ([id number]) -------------------------------------------------------------------------------- Progress Note Details Patient Name: Shane Bean Date of Service: 05/27/2020 12:30 PM Medical Record Number: [id number] Patient Account Number: [id number] Date of Birth/Sex: 1955/01/31 (64 y.o. M) Treating RN: Shane Bean Primary Care Provider: Reeves Dam Other Clinician: Referring Provider: Reeves Dam Treating Provider/Extender: Shane Bean in Treatment: 1 Subjective History of Present Illness (HPI) 05/18/2020 upon evaluation today patient appears for initial evaluation here in clinic concerning issues that he has been having with the wound on his left lateral leg. Fortunately there does not appear to be any signs of obvious an active infection  at this time which is great news. With that being said the patient unfortunately is continued to have issues with pain although he tells me it is gotten a lot better. He was on Keflex as well as Bactrim DS which seems to have done a good job there. His most recent hemoglobin A1c was 9.8 that was on 04/14/2020. Subsequently I do feel like that the patient is making progress here. He does have a history of chronic venous insufficiency as well as hypertension. I do not see any need for antibiotics at this point. 5/25; follow-up of the wound on the left posterior calf. This is completely necrotic on the surface. It does not look infected but it is painful. He is a diabetic but I think there is some suggestion here of chronic venous disease as well. We used Iodoflex under compression last week Objective Constitutional Sitting or standing Blood Pressure is within target range for patient.. Pulse regular and within target range for patient.Marland Kitchen Respirations regular, non- labored and within target range.. Temperature is normal and within the target range for the patient.Marland Kitchen appears in no distress. Vitals Time  Taken: 12:42 PM, Height: 70 in, Weight: 290 lbs, BMI: 41.6, Temperature: 98.3 F, Pulse: 71 bpm, Respiratory Rate: 18 breaths/min, Blood Pressure: 130/76 mmHg. Cardiovascular Pedal pulses are palpable. General Notes: Wound exam; this week there is absolutely no good granulation on this wound thick adherent black/yellow necrotic surface. I used a #5 curette to try and remove as much of this as I could although he tolerated this very poorly. No clear evidence of infection no surrounding soft tissue crepitus. Integumentary (Hair, Skin) Wound #1 status is Open. Original cause of wound was Gradually Appeared. The date acquired was: 04/08/2020. The wound has been in treatment 1 weeks. The wound is located on the Left,Posterior Lower Leg. The wound measures 3cm length x 3.8cm width x 0.1cm depth; 8.954cm^2 area and 0.895cm^3 volume. There is Fat Layer (Subcutaneous Tissue) exposed. There is no tunneling or undermining noted. There is a small amount of serosanguineous drainage noted. There is no granulation within the wound bed. There is a large (67-100%) amount of necrotic tissue within the wound bed including Eschar. Assessment Active Problems ICD-10 Type 2 diabetes mellitus with other skin ulcer Venous insufficiency (chronic) (peripheral) Non-pressure chronic ulcer of other part of left lower leg with fat layer exposed Essential (primary) hypertension Vejar, Lucile V. ([id number]) Procedures Wound #1 Pre-procedure diagnosis of Wound #1 is a Diabetic Wound/Ulcer of the Lower Extremity located on the Left,Posterior Lower Leg .Severity of Tissue Pre Debridement is: Fat layer exposed. There was a Excisional Skin/Subcutaneous Tissue Debridement with a total area of 11.4 sq cm performed by Shane Dillon, MD. With the following instrument(s): Curette to remove Viable and Non-Viable tissue/material. Material removed includes Eschar, Subcutaneous Tissue, Slough, Skin: Dermis, Skin: Epidermis, and  Biofilm. No specimens were taken. A time out was conducted at 13:05, prior to the start of the procedure. A Minimum amount of bleeding was controlled with Pressure. The procedure was tolerated well. Post Debridement Measurements: 3cm length x 3.8cm width x 0.1cm depth; 0.895cm^3 volume. Character of Wound/Ulcer Post Debridement is stable. Severity of Tissue Post Debridement is: Fat layer exposed. Post procedure Diagnosis Wound #1: Same as Pre-Procedure Plan 1. I continued with the Iodoflex under compression 2. He does have a history of wounds and he shows me to scarred areas on the right anterior mid tibia suggest to me that this is probably chronic venous disease. 3. He  is going to need ongoing debridement both mechanically and in terms of what dressings we choose. He was nowhere near a viable granulated surface. 4. Edema was well controlled Electronic Signature(s) Signed: 05/28/2020 8:39:06 AM By: Shane Ham MD Entered By: Shane Bean on 05/27/2020 13:17:33 Shane Bean ([id number]) -------------------------------------------------------------------------------- SuperBill Details Patient Name: Shane Bean Date of Service: 05/27/2020 Medical Record Number: [id number] Patient Account Number: [id number] Date of Birth/Sex: 1955/01/18 (64 y.o. M) Treating RN: Shane Bean Primary Care Provider: Reeves Dam Other Clinician: Referring Provider: Reeves Dam Treating Provider/Extender: Shane Bean in Treatment: 1 Diagnosis Coding ICD-10 Codes Code Description E11.622 Type 2 diabetes mellitus with other skin ulcer I87.2 Venous insufficiency (chronic) (peripheral) 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 layer exposed I10 Essential (primary) hypertension Facility Procedures CPT4 Code: [id number] Description: 67893 - DEB SUBQ TISSUE 20 SQ CM/< Modifier: Quantity: 1 CPT4 Code: Description: ICD-10 Diagnosis Description  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 layer Modifier: exposed Quantity: Physician Procedures CPT4 Code: 8101751 Description: 11042 - WC PHYS SUBQ TISS 20 SQ CM Modifier: Quantity: 1 CPT4 Code: Description: ICD-10 Diagnosis Description 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 layer Modifier: exposed Quantity: Electronic Signature(s) Signed: 05/28/2020 8:39:06 AM By: Shane Ham MD Entered By: Shane Bean on 05/27/2020 14:28:21

## 2020-06-03 ENCOUNTER — Other Ambulatory Visit: Payer: Self-pay

## 2020-06-03 ENCOUNTER — Encounter: Payer: Medicare (Managed Care) | Attending: Internal Medicine | Admitting: Internal Medicine

## 2020-06-03 DIAGNOSIS — I872 Venous insufficiency (chronic) (peripheral): Secondary | ICD-10-CM | POA: Insufficient documentation

## 2020-06-03 DIAGNOSIS — Z7901 Long term (current) use of anticoagulants: Secondary | ICD-10-CM | POA: Insufficient documentation

## 2020-06-03 DIAGNOSIS — E11622 Type 2 diabetes mellitus with other skin ulcer: Secondary | ICD-10-CM | POA: Diagnosis not present

## 2020-06-03 DIAGNOSIS — L97222 Non-pressure chronic ulcer of left calf with fat layer exposed: Secondary | ICD-10-CM | POA: Diagnosis not present

## 2020-06-03 DIAGNOSIS — L97229 Non-pressure chronic ulcer of left calf with unspecified severity: Secondary | ICD-10-CM | POA: Diagnosis present

## 2020-06-03 DIAGNOSIS — I10 Essential (primary) hypertension: Secondary | ICD-10-CM | POA: Insufficient documentation

## 2020-06-04 NOTE — Progress Notes (Signed)
Shane Bean, Shane Bean ([id number]) Visit Report for 06/03/2020 Arrival Information Details Patient Name: Shane Bean, Shane Bean Date of Service: 06/03/2020 1:00 PM Medical Record Number: [id number] Patient Account Number: [id number] Date of Birth/Sex: [birth year] (64 y.o. M) Treating RN: Donnamarie Poag Primary Care Lexany Belknap: Reeves Dam Other Clinician: Referring Susa Bones: Reeves Dam Treating Rylan Bernard/Extender: Tito Dine in Treatment: 2 Visit Information History Since Last Visit Added or deleted any medications: No Patient Arrived: Ambulatory Had a fall or experienced change in No Arrival Time: 12:59 activities of daily living that may affect Accompanied By: self risk of falls: Transfer Assistance: None Hospitalized since last visit: No Patient Requires Transmission-Based No Has Dressing in Place as Prescribed: Yes Precautions: Has Compression in Place as Prescribed: Yes Patient Has Alerts: Yes Pain Present Now: Yes Patient Alerts: Patient on Blood Thinner DIABETIC Electronic Signature(s) Signed: 06/04/2020 9:13:32 AM By: Donnamarie Poag Entered By: Donnamarie Poag on 06/03/2020 13:01:33 Shane Bean ([id number]) -------------------------------------------------------------------------------- Clinic Level of Care Assessment Details Patient Name: Shane Bean Date of Service: 06/03/2020 1:00 PM Medical Record Number: [id number] Patient Account Number: [id number] Date of Birth/Sex: [birth year] (64 y.o. M) Treating RN: Dolan Amen Primary Care Shyne Resch: Reeves Dam Other Clinician: Referring Burnell Matlin: Reeves Dam Treating Tiyonna Sardinha/Extender: Tito Dine in Treatment: 2 Clinic Level of Care Assessment Items TOOL 1 Quantity Score []  - Use when EandM and Procedure is performed on INITIAL visit 0 ASSESSMENTS - Nursing Assessment / Reassessment []  - General Physical Exam (combine w/ comprehensive assessment (listed just below) when performed on new 0 pt.  evals) []  - 0 Comprehensive Assessment (HX, ROS, Risk Assessments, Wounds Hx, etc.) ASSESSMENTS - Wound and Skin Assessment / Reassessment []  - Dermatologic / Skin Assessment (not related to wound area) 0 ASSESSMENTS - Ostomy and/or Continence Assessment and Care []  - Incontinence Assessment and Management 0 []  - 0 Ostomy Care Assessment and Management (repouching, etc.) PROCESS - Coordination of Care []  - Simple Patient / Family Education for ongoing care 0 []  - 0 Complex (extensive) Patient / Family Education for ongoing care []  - 0 Staff obtains Programmer, systems, Records, Test Results / Process Orders []  - 0 Staff telephones HHA, Nursing Homes / Clarify orders / etc []  - 0 Routine Transfer to another Facility (non-emergent condition) []  - 0 Routine Hospital Admission (non-emergent condition) []  - 0 New Admissions / Biomedical engineer / Ordering NPWT, Apligraf, etc. []  - 0 Emergency Hospital Admission (emergent condition) PROCESS - Special Needs []  - Pediatric / Minor Patient Management 0 []  - 0 Isolation Patient Management []  - 0 Hearing / Language / Visual special needs []  - 0 Assessment of Community assistance (transportation, D/C planning, etc.) []  - 0 Additional assistance / Altered mentation []  - 0 Support Surface(s) Assessment (bed, cushion, seat, etc.) INTERVENTIONS - Miscellaneous []  - External ear exam 0 []  - 0 Patient Transfer (multiple staff / Civil Service fast streamer / Similar devices) []  - 0 Simple Staple / Suture removal (25 or less) []  - 0 Complex Staple / Suture removal (26 or more) []  - 0 Hypo/Hyperglycemic Management (do not check if billed separately) []  - 0 Ankle / Brachial Index (ABI) - do not check if billed separately Has the patient been seen at the hospital within the last three years: Yes Total Score: 0 Level Of Care: ____ Shane Bean ([id number]) Electronic Signature(s) Signed: 06/03/2020 4:52:16 PM By: Georges Mouse, Minus Breeding RN Entered By:  Georges Mouse, Minus Breeding on 06/03/2020 13:40:47 Shane Bean ([id number]) -------------------------------------------------------------------------------- Compression Therapy Details Patient Name: Shane Bean,  Shane Bean Date of Service: 06/03/2020 1:00 PM Medical Record Number: [id number] Patient Account Number: [id number] Date of Birth/Sex: 04/11/1955 (64 y.o. M) Treating RN: Dolan Amen Primary Care Amaree Loisel: Reeves Dam Other Clinician: Referring Jamani Bearce: Reeves Dam Treating An Schnabel/Extender: Tito Dine in Treatment: 2 Compression Therapy Performed for Wound Assessment: Wound #1 Left,Posterior Lower Leg Performed By: Clinician Dolan Amen, RN Compression Type: Three Layer Pre Treatment ABI: 1.4 Post Procedure Diagnosis Same as Pre-procedure Electronic Signature(s) Signed: 06/03/2020 4:52:16 PM By: Georges Mouse, Minus Breeding RN Entered By: Georges Mouse, Minus Breeding on 06/03/2020 13:39:22 Shane Bean ([id number]) -------------------------------------------------------------------------------- Encounter Discharge Information Details Patient Name: Shane Bean Date of Service: 06/03/2020 1:00 PM Medical Record Number: [id number] Patient Account Number: [id number] Date of Birth/Sex: Mar 03, 1955 (64 y.o. M) Treating RN: Donnamarie Poag Primary Care Hildred Pharo: Reeves Dam Other Clinician: Referring Belvin Gauss: Reeves Dam Treating Nyron Mozer/Extender: Tito Dine in Treatment: 2 Encounter Discharge Information Items Post Procedure Vitals Discharge Condition: Stable Temperature (F): 97.8 Ambulatory Status: Ambulatory Pulse (bpm): 76 Discharge Destination: Home Respiratory Rate (breaths/min): 16 Transportation: Private Auto Blood Pressure (mmHg): 146/75 Accompanied By: self Schedule Follow-up Appointment: Yes Clinical Summary of Care: Electronic Signature(s) Signed: 06/04/2020 9:13:32 AM By: Donnamarie Poag Entered By: Donnamarie Poag on 06/03/2020  13:53:26 Shane Bean ([id number]) -------------------------------------------------------------------------------- Lower Extremity Assessment Details Patient Name: Shane Bean Date of Service: 06/03/2020 1:00 PM Medical Record Number: [id number] Patient Account Number: [id number] Date of Birth/Sex: [birth year] (64 y.o. M) Treating RN: Donnamarie Poag Primary Care Saki Legore: Reeves Dam Other Clinician: Referring Savannaha Stonerock: Reeves Dam Treating Corrigan Kretschmer/Extender: Tito Dine in Treatment: 2 Edema Assessment Assessed: [Left: Yes] [Right: No] [Left: Edema] [Right: :] Calf Left: Right: Point of Measurement: 39 cm From Medial Instep 41 cm Ankle Left: Right: Point of Measurement: 10 cm From Medial Instep 25.5 cm Knee To Floor Left: Right: From Medial Instep 44 cm Vascular Assessment Pulses: Dorsalis Pedis Palpable: [Left:Yes] Electronic Signature(s) Signed: 06/04/2020 9:13:32 AM By: Donnamarie Poag Entered By: Donnamarie Poag on 06/03/2020 13:08:02 Shane Bean ([id number]) -------------------------------------------------------------------------------- Multi Wound Chart Details Patient Name: Shane Bean Date of Service: 06/03/2020 1:00 PM Medical Record Number: [id number] Patient Account Number: [id number] Date of Birth/Sex: 02/03/1955 (64 y.o. M) Treating RN: Dolan Amen Primary Care Jolena Kittle: Reeves Dam Other Clinician: Referring Michell Kader: Reeves Dam Treating Alashia Brownfield/Extender: Tito Dine in Treatment: 2 Vital Signs Height(in): 70 Pulse(bpm): 69 Weight(lbs): 290 Blood Pressure(mmHg): 146/75 Body Mass Index(BMI): 42 Temperature(F): 98.2 Respiratory Rate(breaths/min): 18 Photos: [N/A:N/A] Wound Location: Left, Posterior Lower Leg N/A N/A Wounding Event: Gradually Appeared N/A N/A Primary Etiology: Diabetic Wound/Ulcer of the Lower N/A N/A Extremity Comorbid History: Hypertension, Type II Diabetes, N/A  N/A Neuropathy Date Acquired: 04/08/2020 N/A N/A Weeks of Treatment: 2 N/A N/A Wound Status: Open N/A N/A Measurements L x W x D (cm) 3x4x0.1 N/A N/A Area (cm) : 9.425 N/A N/A Volume (cm) : 0.942 N/A N/A % Reduction in Area: 14.30% N/A N/A % Reduction in Volume: 14.40% N/A N/A Classification: Grade 2 N/A N/A Exudate Amount: Small N/A N/A Exudate Type: Serosanguineous N/A N/A Exudate Color: red, brown N/A N/A Granulation Amount: None Present (0%) N/A N/A Necrotic Amount: Large (67-100%) N/A N/A Necrotic Tissue: Eschar N/A N/A Exposed Structures: Fat Layer (Subcutaneous Tissue): N/A N/A Yes Fascia: No Tendon: No Muscle: No Joint: No Bone: No Epithelialization: Small (1-33%) N/A N/A Debridement: Debridement - Excisional N/A N/A Pre-procedure Verification/Time 13:37 N/A N/A Out Taken: Tissue Debrided: Necrotic/Eschar, Subcutaneous N/A N/A Level: Skin/Subcutaneous Tissue N/A N/A  Debridement Area (sq cm): 1.5 N/A N/A Instrument: Curette N/A N/A Bleeding: Minimum N/A N/A Hemostasis Achieved: Pressure N/A N/A Debridement Treatment Procedure was tolerated well N/A N/A Response: Post Debridement 3x4x0.2 N/A N/A Measurements L x W x D (cm) 1.885 N/A N/A Shane Bean, Shane Bean ([id number]) Post Debridement Volume: (cm) Procedures Performed: Compression Therapy N/A N/A Debridement Treatment Notes Electronic Signature(s) Signed: 06/03/2020 5:08:05 PM By: Linton Ham MD Entered By: Linton Ham on 06/03/2020 13:43:52 Shane Bean ([id number]) -------------------------------------------------------------------------------- [place] Details Patient Name: Shane Bean Date of Service: 06/03/2020 1:00 PM Medical Record Number: [id number] Patient Account Number: [id number] Date of Birth/Sex: Sep 12, 1955 (64 y.o. M) Treating RN: Dolan Amen Primary Care Pyper Olexa: Reeves Dam Other Clinician: Referring Jasiel Apachito: Reeves Dam Treating  Abou Sterkel/Extender: Tito Dine in Treatment: 2 Active Inactive Nutrition Nursing Diagnoses: Imbalanced nutrition Impaired glucose control: actual or potential Potential for alteratiion in Nutrition/Potential for imbalanced nutrition Goals: Patient/caregiver agrees to and verbalizes understanding of need to obtain nutritional consultation Date Initiated: 05/18/2020 Target Resolution Date: 06/19/2020 Goal Status: Active Interventions: Assess HgA1c results as ordered upon admission and as needed Assess patient nutrition upon admission and as needed per policy Provide education on elevated blood sugars and impact on wound healing Notes: Wound/Skin Impairment Nursing Diagnoses: Impaired tissue integrity Knowledge deficit related to smoking impact on wound healing Knowledge deficit related to ulceration/compromised skin integrity Goals: Patient/caregiver will verbalize understanding of skin care regimen Date Initiated: 05/18/2020 Target Resolution Date: 06/19/2020 Goal Status: Active Ulcer/skin breakdown will have a volume reduction of 30% by week 4 Date Initiated: 05/18/2020 Target Resolution Date: 06/15/2020 Goal Status: Active Ulcer/skin breakdown will have a volume reduction of 50% by week 8 Date Initiated: 05/18/2020 Target Resolution Date: 07/13/2020 Goal Status: Active Ulcer/skin breakdown will have a volume reduction of 80% by week 12 Date Initiated: 05/18/2020 Target Resolution Date: 08/10/2020 Goal Status: Active Interventions: Assess patient/caregiver ability to obtain necessary supplies Assess patient/caregiver ability to perform ulcer/skin care regimen upon admission and as needed Assess ulceration(s) every visit Notes: Electronic Signature(s) Signed: 06/03/2020 4:52:16 PM By: Georges Mouse, Minus Breeding RN Entered By: Georges Mouse, Minus Breeding on 06/03/2020 13:36:02 Shane Bean  ([id number]) -------------------------------------------------------------------------------- Pain Assessment Details Patient Name: Shane Bean Date of Service: 06/03/2020 1:00 PM Medical Record Number: [id number] Patient Account Number: [id number] Date of Birth/Sex: [birth year] (64 y.o. M) Treating RN: Donnamarie Poag Primary Care Chesley Veasey: Reeves Dam Other Clinician: Referring Corazon Nickolas: Reeves Dam Treating Terena Bohan/Extender: Tito Dine in Treatment: 2 Active Problems Location of Pain Severity and Description of Pain Patient Has Paino Yes Site Locations Pain Location: Generalized Pain, Pain in Ulcers Rate the pain. Current Pain Level: 10 Character of Pain Describe the Pain: Shooting Pain Management and Medication Current Pain Management: Notes states feels like needles Electronic Signature(s) Signed: 06/04/2020 9:13:32 AM By: Donnamarie Poag Entered By: Donnamarie Poag on 06/03/2020 13:02:30 Shane Bean ([id number]) -------------------------------------------------------------------------------- Patient/Caregiver Education Details Patient Name: Shane Bean Date of Service: 06/03/2020 1:00 PM Medical Record Number: [id number] Patient Account Number: [id number] Date of Birth/Gender: 1955-04-02 (64 y.o. M) Treating RN: Dolan Amen Primary Care Physician: Reeves Dam Other Clinician: Referring Physician: Reeves Dam Treating Physician/Extender: Tito Dine in Treatment: 2 Education Assessment Education Provided To: Patient Education Topics Provided Wound/Skin Impairment: Methods: Explain/Verbal Responses: State content correctly Electronic Signature(s) Signed: 06/03/2020 4:52:16 PM By: Georges Mouse, Minus Breeding RN Entered By: Georges Mouse, Minus Breeding on 06/03/2020 13:41:03 Shane Bean ([id number]) -------------------------------------------------------------------------------- Wound Assessment Details Patient Name: Shane Bean  Date of Service: 06/03/2020 1:00 PM Medical Record Number: [id number] Patient Account Number: [id number] Date of Birth/Sex: [birth year] (64 y.o. M) Treating RN: Donnamarie Poag Primary Care Zaidee Rion: Reeves Dam Other Clinician: Referring Aryanah Enslow: Reeves Dam Treating Safiyya Stokes/Extender: Tito Dine in Treatment: 2 Wound Status Wound Number: 1 Primary Etiology: Diabetic Wound/Ulcer of the Lower Extremity Wound Location: Left, Posterior Lower Leg Wound Status: Open Wounding Event: Gradually Appeared Comorbid History: Hypertension, Type II Diabetes, Neuropathy Date Acquired: 04/08/2020 Weeks Of Treatment: 2 Clustered Wound: No Photos Wound Measurements Length: (cm) 3 Width: (cm) 4 Depth: (cm) 0.1 Area: (cm) 9.425 Volume: (cm) 0.942 % Reduction in Area: 14.3% % Reduction in Volume: 14.4% Epithelialization: Small (1-33%) Tunneling: No Undermining: No Wound Description Classification: Grade 2 Exudate Amount: Small Exudate Type: Serosanguineous Exudate Color: red, brown Foul Odor After Cleansing: No Slough/Fibrino Yes Wound Bed Granulation Amount: None Present (0%) Exposed Structure Necrotic Amount: Large (67-100%) Fascia Exposed: No Necrotic Quality: Eschar Fat Layer (Subcutaneous Tissue) Exposed: Yes Tendon Exposed: No Muscle Exposed: No Joint Exposed: No Bone Exposed: No Treatment Notes Wound #1 (Lower Leg) Wound Laterality: Left, Posterior Cleanser Soap and Water Discharge Instruction: Gently cleanse wound with antibacterial soap, rinse and pat dry prior to dressing wounds Peri-Wound Care Shane Bean, Shane Bean ([id number]) Topical Primary Dressing Cutimed Sorbact 1.5x 2.38 (in/in) Discharge Instruction: Moisten with hydrogel-A bacteria- and fungi binding wound dressing, suitable for cavities and fistulas. It is suitable as a wound filler and allows the passage of wound exudate into a secondary dressing. The dressing helps reducing odor and pain  and can improve healing. Secondary Dressing ABD Pad 5x9 (in/in) Discharge Instruction: Cover with ABD pad Secured With Compression Wrap Profore Lite LF 3 Multilayer Compression Bandaging System Discharge Instruction: Apply 3 multi-layer wrap as prescribed. Compression Stockings Add-Ons Electronic Signature(s) Signed: 06/04/2020 9:13:32 AM By: Donnamarie Poag Entered By: Donnamarie Poag on 06/03/2020 13:06:28 Shane Bean ([id number]) -------------------------------------------------------------------------------- Vitals Details Patient Name: Shane Bean Date of Service: 06/03/2020 1:00 PM Medical Record Number: [id number] Patient Account Number: [id number] Date of Birth/Sex: 1955/02/07 (64 y.o. M) Treating RN: Donnamarie Poag Primary Care Nielle Duford: Reeves Dam Other Clinician: Referring Klaus Casteneda: Reeves Dam Treating Volanda Mangine/Extender: Tito Dine in Treatment: 2 Vital Signs Time Taken: 13:01 Temperature (F): 98.2 Height (in): 70 Pulse (bpm): 71 Weight (lbs): 290 Respiratory Rate (breaths/min): 18 Body Mass Index (BMI): 41.6 Blood Pressure (mmHg): 146/75 Reference Range: 80 - 120 mg / dl Electronic Signature(s) Signed: 06/04/2020 9:13:32 AM By: Donnamarie Poag Entered ByDonnamarie Poag on 06/03/2020 13:01:59

## 2020-06-04 NOTE — Progress Notes (Signed)
TYRIE, PORZIO ([id number]) Visit Report for 06/03/2020 Debridement Details Patient Name: Shane Bean Date of Service: 06/03/2020 1:00 PM Medical Record Number: [id number] Patient Account Number: [id number] Date of Birth/Sex: 1955/07/08 (65 y.o. M) Treating RN: Cornell Barman Primary Care Provider: Reeves Dam Other Clinician: Referring Provider: Reeves Dam Treating Provider/Extender: Tito Dine in Treatment: 2 Debridement Performed for Wound #1 Left,Posterior Lower Leg Assessment: Performed By: Physician Ricard Dillon, MD Debridement Type: Debridement Severity of Tissue Pre Debridement: Fat layer exposed Level of Consciousness (Pre- Awake and Alert procedure): Pre-procedure Verification/Time Out Yes - 13:37 Taken: Start Time: 13:37 Total Area Debrided (L x W): 1 (cm) x 1.5 (cm) = 1.5 (cm) Tissue and other material Viable, Non-Viable, Eschar, Subcutaneous debrided: Level: Skin/Subcutaneous Tissue Debridement Description: Excisional Instrument: Curette Bleeding: Minimum Hemostasis Achieved: Pressure Response to Treatment: Procedure was tolerated well Level of Consciousness (Post- Awake and Alert procedure): Post Debridement Measurements of Total Wound Length: (cm) 3 Width: (cm) 4 Depth: (cm) 0.2 Volume: (cm) 1.885 Character of Wound/Ulcer Post Debridement: Stable Severity of Tissue Post Debridement: Fat layer exposed Post Procedure Diagnosis Same as Pre-procedure Electronic Signature(s) Signed: 06/03/2020 5:08:05 PM By: Linton Ham MD Signed: 06/04/2020 2:40:56 PM By: Gretta Cool, BSN, RN, CWS, Kim RN, BSN Entered By: Linton Ham on 06/03/2020 13:44:07 Shane Bean ([id number]) -------------------------------------------------------------------------------- HPI Details Patient Name: Shane Bean Date of Service: 06/03/2020 1:00 PM Medical Record Number: [id number] Patient Account Number: [id number] Date of Birth/Sex: [birth year]  (65 y.o. M) Treating RN: Cornell Barman Primary Care Provider: Reeves Dam Other Clinician: Referring Provider: Reeves Dam Treating Provider/Extender: Tito Dine in Treatment: 2 History of Present Illness HPI Description: 05/18/2020 upon evaluation today patient appears for initial evaluation here in clinic concerning issues that he has been having with the wound on his left lateral leg. Fortunately there does not appear to be any signs of obvious an active infection at this time which is great news. With that being said the patient unfortunately is continued to have issues with pain although he tells me it is gotten a lot better. He was on Keflex as well as Bactrim DS which seems to have done a good job there. His most recent hemoglobin A1c was 9.8 that was on 04/14/2020. Subsequently I do feel like that the patient is making progress here. He does have a history of chronic venous insufficiency as well as hypertension. I do not see any need for antibiotics at this point. 5/25; follow-up of the wound on the left posterior calf. This is completely necrotic on the surface. It does not look infected but it is painful. He is a diabetic but I think there is some suggestion here of chronic venous disease as well. We used Iodoflex under compression last week 6/1; again a completely necrotic surface on this with the wound. We have been using Iodoflex under compression he complains of gnawing pain. He has had wounds previously a lot of this looks like chronic venous disease. Electronic Signature(s) Signed: 06/03/2020 5:08:05 PM By: Linton Ham MD Entered By: Linton Ham on 06/03/2020 13:45:02 Shane Bean ([id number]) -------------------------------------------------------------------------------- Physical Exam Details Patient Name: Shane Bean Date of Service: 06/03/2020 1:00 PM Medical Record Number: [id number] Patient Account Number: [id number] Date of Birth/Sex:  07-04-1955 (65 y.o. M) Treating RN: Cornell Barman Primary Care Provider: Reeves Dam Other Clinician: Referring Provider: Reeves Dam Treating Provider/Extender: Tito Dine in Treatment: 2 Constitutional Patient is hypertensive.. Pulse regular and within target range for patient.Marland Kitchen  Respirations regular, non-labored and within target range.. Temperature is normal and within the target range for the patient.Marland Kitchen appears in no distress. Notes Wound exam; completely nonviable surface in spite of last weeks attempt at debridement and 2 weeks worth of Iodoflex. I used a #5 curette doing a painstaking debridement however he does not tolerate this. I was able to get to may be 33% of the wound area. There is absolutely no evidence of surrounding infection the edges are not rolled or heat there is no discoloration Electronic Signature(s) Signed: 06/03/2020 5:08:05 PM By: Linton Ham MD Entered By: Linton Ham on 06/03/2020 13:46:10 Shane Bean ([id number]) -------------------------------------------------------------------------------- Physician Orders Details Patient Name: Shane Bean Date of Service: 06/03/2020 1:00 PM Medical Record Number: [id number] Patient Account Number: [id number] Date of Birth/Sex: 1955-08-18 (65 y.o. M) Treating RN: Dolan Amen Primary Care Provider: Reeves Dam Other Clinician: Referring Provider: Reeves Dam Treating Provider/Extender: Tito Dine in Treatment: 2 Verbal / Phone Orders: No Diagnosis Coding Follow-up Appointments o Return Appointment in 1 week. o Nurse Visit as needed Bathing/ Shower/ Hygiene o May shower with wound dressing protected with water repellent cover or cast protector. - cover dressing during shower-do not get it wet Edema Control - Lymphedema / Segmental Compressive Device / Other o Optional: One layer of unna paste to top of compression wrap (to act as an anchor). Wound  Treatment Wound #1 - Lower Leg Wound Laterality: Left, Posterior Cleanser: Soap and Water Discharge Instructions: Gently cleanse wound with antibacterial soap, rinse and pat dry prior to dressing wounds Primary Dressing: Cutimed Sorbact 1.5x 2.38 (in/in) Discharge Instructions: Moisten with hydrogel-A bacteria- and fungi binding wound dressing, suitable for cavities and fistulas. It is suitable as a wound filler and allows the passage of wound exudate into a secondary dressing. The dressing helps reducing odor and pain and can improve healing. Secondary Dressing: ABD Pad 5x9 (in/in) Discharge Instructions: Cover with ABD pad Compression Wrap: Profore Lite LF 3 Multilayer Compression Bandaging System Discharge Instructions: Apply 3 multi-layer wrap as prescribed. Electronic Signature(s) Signed: 06/03/2020 4:52:16 PM By: Georges Mouse, Minus Breeding RN Signed: 06/03/2020 5:08:05 PM By: Linton Ham MD Entered By: Georges Mouse, Minus Breeding on 06/03/2020 McLaughlin, Morral V. ([id number]) -------------------------------------------------------------------------------- Problem List Details Patient Name: Shane Bean Date of Service: 06/03/2020 1:00 PM Medical Record Number: [id number] Patient Account Number: [id number] Date of Birth/Sex: November 26, 1955 (65 y.o. 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 M) Treating RN: Cornell Barman Primary Care Provider: Reeves Dam Other Clinician: Referring Provider: Reeves Dam Treating Provider/Extender: Tito Dine in Treatment: 2 Subjective History of Present Illness (HPI) 05/18/2020 upon evaluation today patient appears for initial evaluation here in clinic concerning issues that he has been having with the wound on  his left lateral leg. Fortunately there does not appear to be any signs of obvious an active infection at this time which is great news. With that being said the patient unfortunately is continued to have issues with pain although he tells me it is gotten a lot better. He was on Keflex as well as Bactrim DS which seems to have done a good job there. His most recent hemoglobin A1c was 9.8 that was on 04/14/2020. Subsequently I do feel like that the patient is making progress here. He does have a history of chronic venous insufficiency as well as hypertension. I do not see any need for antibiotics at this point. 5/25; follow-up of the wound on the left posterior calf. This is completely necrotic on the surface. It does not look infected but it is painful. He is a diabetic but I think there is some suggestion here of chronic venous disease as well. We used Iodoflex under compression last week 6/1; again a completely necrotic surface on this with the wound. We have been using Iodoflex under compression he complains of gnawing pain. He has had wounds previously a lot of this looks like chronic venous disease. Objective Constitutional Patient  is hypertensive.. Pulse regular and within target range for patient.Marland Kitchen Respirations regular, non-labored and within target range.. Temperature is normal and within the target range for the patient.Marland Kitchen appears in no distress. Vitals Time Taken: 1:01 PM, Height: 70 in, Weight: 290 lbs, BMI: 41.6, Temperature: 98.2 F, Pulse: 71 bpm, Respiratory Rate: 18 breaths/min, Blood Pressure: 146/75 mmHg. General Notes: Wound exam; completely nonviable surface in spite of last weeks attempt at debridement and 2 weeks worth of Iodoflex. I used a #5 curette doing a painstaking debridement however he does not tolerate this. I was able to get to may be 33% of the wound area. There is absolutely no evidence of surrounding infection the edges are not rolled or heat there is no discoloration Integumentary (Hair, Skin) Wound #1 status is Open. Original cause of wound was Gradually Appeared. The date acquired was: 04/08/2020. The wound has been in treatment 2 weeks. The wound is located on the Left,Posterior Lower Leg. The wound measures 3cm length x 4cm width x 0.1cm depth; 9.425cm^2 area and 0.942cm^3 volume. There is Fat Layer (Subcutaneous Tissue) exposed. There is no tunneling or undermining noted. There is a small amount of serosanguineous drainage noted. There is no granulation within the wound bed. There is a large (67-100%) amount of necrotic tissue within the wound bed including Eschar. Assessment Active Problems ICD-10 Type 2 diabetes mellitus with other skin ulcer Venous insufficiency (chronic) (peripheral) Non-pressure chronic ulcer of other part of left lower leg with fat layer exposed Essential (primary) hypertension Bott, Rocco V. ([id number]) Procedures Wound #1 Pre-procedure diagnosis of Wound #1 is a Diabetic Wound/Ulcer of the Lower Extremity located on the Left,Posterior Lower Leg .Severity of Tissue Pre Debridement is: Fat layer exposed. There was a Excisional Skin/Subcutaneous Tissue  Debridement with a total area of 1.5 sq cm performed by Ricard Dillon, MD. With the following instrument(s): Curette to remove Viable and Non-Viable tissue/material. Material removed includes Eschar and Subcutaneous Tissue and. A time out was conducted at 13:37, prior to the start of the procedure. A Minimum amount of bleeding was controlled with Pressure. The procedure was tolerated well. Post Debridement Measurements: 3cm length x 4cm width x 0.2cm depth; 1.885cm^3 volume. Character of Wound/Ulcer Post Debridement is stable. Severity of Tissue Post Debridement is: Fat layer exposed. Post procedure  Diagnosis Wound #1: Same as Pre-Procedure Pre-procedure diagnosis of Wound #1 is a Diabetic Wound/Ulcer of the Lower Extremity located on the Left,Posterior Lower Leg . There was a Three Layer Compression Therapy Procedure with a pre-treatment ABI of 1.4 by Dolan Amen, RN. Post procedure Diagnosis Wound #1: Same as Pre-Procedure Plan Follow-up Appointments: Return Appointment in 1 week. Nurse Visit as needed Bathing/ Shower/ Hygiene: May shower with wound dressing protected with water repellent cover or cast protector. - cover dressing during shower-do not get it wet Edema Control - Lymphedema / Segmental Compressive Device / Other: Optional: One layer of unna paste to top of compression wrap (to act as an anchor). WOUND #1: - Lower Leg Wound Laterality: Left, Posterior Cleanser: Soap and Water Discharge Instructions: Gently cleanse wound with antibacterial soap, rinse and pat dry prior to dressing wounds Primary Dressing: Cutimed Sorbact 1.5x 2.38 (in/in) Discharge Instructions: Moisten with hydrogel-A bacteria- and fungi binding wound dressing, suitable for cavities and fistulas. It is suitable as a wound filler and allows the passage of wound exudate into a secondary dressing. The dressing helps reducing odor and pain and can improve healing. Secondary Dressing: ABD Pad 5x9  (in/in) Discharge Instructions: Cover with ABD pad Compression Wrap: Profore Lite LF 3 Multilayer Compression Bandaging System Discharge Instructions: Apply 3 multi-layer wrap as prescribed. 1. Left posterior calf. Change the primary dressing from Iodoflex to Sorbact to see if Iodoflex was behind some of the pain he is complaining about. 2. For now I am still keeping this under compression. There is no doubt he has some component of chronic venous insufficiency. 3. Absolutely no evidence of surrounding infection 4. ABIs noncompressible however pedal pulses are palpable I do not believe he has an underlying arterial issue here. 5. I had some thoughts about biopsying this although I put this off for now Electronic Signature(s) Signed: 06/03/2020 5:08:05 PM By: Linton Ham MD Entered By: Linton Ham on 06/03/2020 13:47:56 Shane Bean ([id number]) -------------------------------------------------------------------------------- [place] Details Patient Name: Shane Bean Date of Service: 06/03/2020 Medical Record Number: [id number] Patient Account Number: [id number] Date of Birth/Sex: 10/08/1955 (65 y.o. M) Treating RN: Cornell Barman Primary Care Provider: Reeves Dam Other Clinician: Referring Provider: Reeves Dam Treating Provider/Extender: Tito Dine in Treatment: 2 Diagnosis Coding ICD-10 Codes Code Description E11.622 Type 2 diabetes mellitus with other skin ulcer I87.2 Venous insufficiency (chronic) (peripheral) 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 layer exposed I10 Essential (primary) hypertension Facility Procedures CPT4 Code: [id number] Description: 28315 - DEB SUBQ TISSUE 20 SQ CM/< Modifier: Quantity: 1 CPT4 Code: Description: ICD-10 Diagnosis Description 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 layer I87.2 Venous insufficiency (chronic) (peripheral) Modifier:  exposed Quantity: Physician Procedures CPT4 Code: 1761607 Description: 11042 - WC PHYS SUBQ TISS 20 SQ CM Modifier: Quantity: 1 CPT4 Code: Description: ICD-10 Diagnosis Description 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 layer I87.2 Venous insufficiency (chronic) (peripheral) Modifier: exposed Quantity: Electronic Signature(s) Signed: 06/03/2020 5:08:05 PM By: Linton Ham MD Entered By: Linton Ham on 06/03/2020 13:48:11

## 2020-06-10 ENCOUNTER — Encounter: Payer: Medicare (Managed Care) | Admitting: Internal Medicine

## 2020-06-10 ENCOUNTER — Other Ambulatory Visit: Payer: Self-pay

## 2020-06-10 DIAGNOSIS — E11622 Type 2 diabetes mellitus with other skin ulcer: Secondary | ICD-10-CM | POA: Diagnosis not present

## 2020-06-11 NOTE — Progress Notes (Signed)
BROGAN, MARTIS ([id number]) Visit Report for 06/10/2020 HPI Details Patient Name: Shane Bean, Shane Bean Date of Service: 06/10/2020 1:00 PM Medical Record Number: [id number] Patient Account Number: [id number] Date of Birth/Sex: January 10, 1955 (64 y.o. M) Treating RN: Cornell Barman Primary Care Provider: Reeves Dam Other Clinician: Referring Provider: Reeves Dam Treating Provider/Extender: Tito Dine in Treatment: 3 History of Present Illness HPI Description: 05/18/2020 upon evaluation today patient appears for initial evaluation here in clinic concerning issues that he has been having with the wound on his left lateral leg. Fortunately there does not appear to be any signs of obvious an active infection at this time which is great news. With that being said the patient unfortunately is continued to have issues with pain although he tells me it is gotten a lot better. He was on Keflex as well as Bactrim DS which seems to have done a good job there. His most recent hemoglobin A1c was 9.8 that was on 04/14/2020. Subsequently I do feel like that the patient is making progress here. He does have a history of chronic venous insufficiency as well as hypertension. I do not see any need for antibiotics at this point. 5/25; follow-up of the wound on the left posterior calf. This is completely necrotic on the surface. It does not look infected but it is painful. He is a diabetic but I think there is some suggestion here of chronic venous disease as well. We used Iodoflex under compression last week 6/1; again a completely necrotic surface on this with the wound. We have been using Iodoflex under compression he complains of gnawing pain. He has had wounds previously a lot of this looks like chronic venous disease. 6/8; about two thirds of the surface of this wound is still covered in a black necrotic eschar. I changed him from Iodoflex to [place] last week because of complaints of pain. He actually  was seen by her neurologist this weeko Peripheral neuropathy as a cause of the pain and they changed him from Neurontin to Lyrica but he still has not had any relief. He says that most of the pain however is in his heel not in his wound per se. He is a diabetic Engineer, maintenance) Signed: 06/10/2020 4:14:58 PM By: Linton Ham MD Entered By: Linton Ham on 06/10/2020 13:51:08 Shane Bean ([id number]) -------------------------------------------------------------------------------- Physical Exam Details Patient Name: Shane Bean Date of Service: 06/10/2020 1:00 PM Medical Record Number: [id number] Patient Account Number: [id number] Date of Birth/Sex: 1955-09-19 (64 y.o. M) Treating RN: Cornell Barman Primary Care Provider: Reeves Dam Other Clinician: Referring Provider: Reeves Dam Treating Provider/Extender: Tito Dine in Treatment: 3 Constitutional Sitting or standing Blood Pressure is within target range for patient.. Pulse regular and within target range for patient.Marland Kitchen Respirations regular, non- labored and within target range.. Temperature is normal and within the target range for the patient.Marland Kitchen appears in no distress. Respiratory Respiratory effort is easy and symmetric bilaterally. Rate is normal at rest and on room air.. Cardiovascular Pedal pulses are palpable and robust at both the dorsalis pedis and posterior tibia. Notes Wound exam; fairly large venous looking wound on the left posterior calf. Probably three quarters of this still covered in the same adherent black necrotic material/eschar. He does not tolerate debridement well in fact he asked me not to do this this week. Electronic Signature(s) Signed: 06/10/2020 4:14:58 PM By: Linton Ham MD Entered By: Linton Ham on 06/10/2020 13:52:20 Shane Bean ([id number]) -------------------------------------------------------------------------------- Physician Orders Details Patient  Name: Shane Bean Date of Service: 06/10/2020 1:00 PM Medical Record Number: [id number] Patient Account Number: [id number] Date of Birth/Sex: 1955-11-13 (64 y.o. M) Treating RN: Cornell Barman Primary Care Provider: Reeves Dam Other Clinician: Referring Provider: Reeves Dam Treating Provider/Extender: Tito Dine in Treatment: 3 Verbal / Phone Orders: No Diagnosis Coding Follow-up Appointments o Return Appointment in 1 week. o Nurse Visit as needed Bathing/ Shower/ Hygiene o May shower with wound dressing protected with water repellent cover or cast protector. - cover dressing during shower-do not get it wet Edema Control - Lymphedema / Segmental Compressive Device / Other o Optional: One layer of unna paste to top of compression wrap (to act as an anchor). Wound Treatment Wound #1 - Lower Leg Wound Laterality: Left, Posterior Cleanser: Soap and Water Discharge Instructions: Gently cleanse wound with antibacterial soap, rinse and pat dry prior to dressing wounds Primary Dressing: Cutimed Sorbact 1.5x 2.38 (in/in) Discharge Instructions: Moisten with hydrogel-A bacteria- and fungi binding wound dressing, suitable for cavities and fistulas. It is suitable as a wound filler and allows the passage of wound exudate into a secondary dressing. The dressing helps reducing odor and pain and can improve healing. Secondary Dressing: ABD Pad 5x9 (in/in) Discharge Instructions: Cover with ABD pad Secured With: Coban Cohesive Bandage 4x5 (yds) Stretched Discharge Instructions: Apply Coban as directed. Secured With: [employer] or Non-Sterile 6-ply 4.5x4 (yd/yd) Discharge Instructions: Apply Kerlix as directed Electronic Signature(s) Signed: 06/10/2020 4:14:58 PM By: Linton Ham MD Signed: 06/10/2020 4:36:28 PM By: Gretta Cool, BSN, RN, CWS, Kim RN, BSN Entered By: Gretta Cool, BSN, RN, CWS, Kim on 06/10/2020 13:48:26 Shane Bean  ([id number]) -------------------------------------------------------------------------------- Problem List Details Patient Name: Shane Bean Date of Service: 06/10/2020 1:00 PM Medical Record Number: [id number] Patient Account Number: [id number] Date of Birth/Sex: 06-23-1955 (64 y.o. M) Treating RN: Cornell Barman Primary Care Provider: Reeves Dam Other Clinician: Referring Provider: Reeves Dam Treating Provider/Extender: Tito Dine in Treatment: 3 Active Problems ICD-10 Encounter Code Description Active Date MDM Diagnosis E11.622 Type 2 diabetes mellitus with other skin ulcer 05/18/2020 No Yes I87.2 Venous insufficiency (chronic) (peripheral) 05/18/2020 No Yes 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 layer 05/18/2020 No Yes exposed I10 Essential (primary) hypertension 05/18/2020 No Yes Inactive Problems Resolved Problems Electronic Signature(s) Signed: 06/10/2020 4:14:58 PM By: Linton Ham MD Entered By: Linton Ham on 06/10/2020 13:49:56 Shane Bean ([id number]) -------------------------------------------------------------------------------- Progress Note Details Patient Name: Shane Bean Date of Service: 06/10/2020 1:00 PM Medical Record Number: [id number] Patient Account Number: [id number] Date of Birth/Sex: [birth year] (64 y.o. M) Treating RN: Cornell Barman Primary Care Provider: Reeves Dam Other Clinician: Referring Provider: Reeves Dam Treating Provider/Extender: Tito Dine in Treatment: 3 Subjective History of Present Illness (HPI) 05/18/2020 upon evaluation today patient appears for initial evaluation here in clinic concerning issues that he has been having with the wound on his left lateral leg. Fortunately there does not appear to be any signs of obvious an active infection at this time which is great news. With that being said the patient unfortunately is continued to have issues  with pain although he tells me it is gotten a lot better. He was on Keflex as well as Bactrim DS which seems to have done a good job there. His most recent hemoglobin A1c was 9.8 that was on 04/14/2020. Subsequently I do feel like that the patient is making progress here. He does have a history of chronic venous  insufficiency as well as hypertension. I do not see any need for antibiotics at this point. 5/25; follow-up of the wound on the left posterior calf. This is completely necrotic on the surface. It does not look infected but it is painful. He is a diabetic but I think there is some suggestion here of chronic venous disease as well. We used Iodoflex under compression last week 6/1; again a completely necrotic surface on this with the wound. We have been using Iodoflex under compression he complains of gnawing pain. He has had wounds previously a lot of this looks like chronic venous disease. 6/8; about two thirds of the surface of this wound is still covered in a black necrotic eschar. I changed him from Iodoflex to [place] last week because of complaints of pain. He actually was seen by her neurologist this weeko Peripheral neuropathy as a cause of the pain and they changed him from Neurontin to Lyrica but he still has not had any relief. He says that most of the pain however is in his heel not in his wound per se. He is a diabetic Objective Constitutional Sitting or standing Blood Pressure is within target range for patient.. Pulse regular and within target range for patient.Marland Kitchen Respirations regular, non- labored and within target range.. Temperature is normal and within the target range for the patient.Marland Kitchen appears in no distress. Vitals Time Taken: 1:23 PM, Height: 70 in, Weight: 290 lbs, BMI: 41.6, Temperature: 99.4 F, Pulse: 83 bpm, Respiratory Rate: 18 breaths/min, Blood Pressure: 146/83 mmHg. Respiratory Respiratory effort is easy and symmetric bilaterally. Rate is normal at rest and on  room air.. Cardiovascular Pedal pulses are palpable and robust at both the dorsalis pedis and posterior tibia. General Notes: Wound exam; fairly large venous looking wound on the left posterior calf. Probably three quarters of this still covered in the same adherent black necrotic material/eschar. He does not tolerate debridement well in fact he asked me not to do this this week. Integumentary (Hair, Skin) Wound #1 status is Open. Original cause of wound was Gradually Appeared. The date acquired was: 04/08/2020. The wound has been in treatment 3 weeks. The wound is located on the Left,Posterior Lower Leg. The wound measures 3.8cm length x 4cm width x 0.1cm depth; 11.938cm^2 area and 1.194cm^3 volume. There is Fat Layer (Subcutaneous Tissue) exposed. There is no tunneling or undermining noted. There is a medium amount of serosanguineous drainage noted. There is small (1-33%) red granulation within the wound bed. There is a large (67-100%) amount of necrotic tissue within the wound bed including Eschar and Adherent Slough. Assessment Active Problems ICD-10 Type 2 diabetes mellitus with other skin ulcer Venous insufficiency (chronic) (peripheral) Shane Bean, Shane V. ([id number]) Non-pressure chronic ulcer of other part of left lower leg with fat layer exposed Essential (primary) hypertension Plan Follow-up Appointments: Return Appointment in 1 week. Nurse Visit as needed Bathing/ Shower/ Hygiene: May shower with wound dressing protected with water repellent cover or cast protector. - cover dressing during shower-do not get it wet Edema Control - Lymphedema / Segmental Compressive Device / Other: Optional: One layer of unna paste to top of compression wrap (to act as an anchor). WOUND #1: - Lower Leg Wound Laterality: Left, Posterior Cleanser: Soap and Water Discharge Instructions: Gently cleanse wound with antibacterial soap, rinse and pat dry prior to dressing wounds Primary Dressing:  Cutimed Sorbact 1.5x 2.38 (in/in) Discharge Instructions: Moisten with hydrogel-A bacteria- and fungi binding wound dressing, suitable for cavities and fistulas. It is suitable  as a wound filler and allows the passage of wound exudate into a secondary dressing. The dressing helps reducing odor and pain and can improve healing. Secondary Dressing: ABD Pad 5x9 (in/in) Discharge Instructions: Cover with ABD pad Secured With: Coban Cohesive Bandage 4x5 (yds) Stretched Discharge Instructions: Apply Coban as directed. Secured With: [employer] or Non-Sterile 6-ply 4.5x4 (yd/yd) Discharge Instructions: Apply Kerlix as directed 1. I am going to continue the Sorbact 2. Because of His complaints of pain I reduced him to simply kerlix Coban wrap I will have to monitor his edema. His neurologist seems to think a lot of this is neuropathy and I think he is ordering EMGs and nerve conduction studies. 3. I do not believe he has an arterial issue specific to this wound area his pedal pulses are robust although his ABI was noncompressible Electronic Signature(s) Signed: 06/10/2020 4:14:58 PM By: Linton Ham MD Entered By: Linton Ham on 06/10/2020 13:53:20 Shane Bean ([id number]) -------------------------------------------------------------------------------- SuperBill Details Patient Name: Shane Bean Date of Service: 06/10/2020 Medical Record Number: [id number] Patient Account Number: [id number] Date of Birth/Sex: 06-Feb-1955 (64 y.o. M) Treating RN: Cornell Barman Primary Care Provider: Reeves Dam Other Clinician: Referring Provider: Reeves Dam Treating Provider/Extender: Tito Dine in Treatment: 3 Diagnosis Coding ICD-10 Codes Code Description E11.622 Type 2 diabetes mellitus with other skin ulcer I87.2 Venous insufficiency (chronic) (peripheral) 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 layer exposed I10 Essential  (primary) hypertension Facility Procedures CPT4 Code: [id number] Description: (Facility Use Only) [phone] - [place] LWR LT LEG Modifier: Quantity: 1 Physician Procedures CPT4 Code: [id number] Description: 74259 - WC PHYS LEVEL 3 - EST PT Modifier: Quantity: 1 CPT4 Code: Description: ICD-10 Diagnosis Description 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 lay I87.2 Venous insufficiency (chronic) (peripheral) E11.622 Type 2 diabetes mellitus with other skin ulcer Modifier: er exposed Quantity: Electronic Signature(s) Signed: 06/10/2020 4:14:58 PM By: Linton Ham MD Entered By: Linton Ham on 06/10/2020 13:53:46

## 2020-06-11 NOTE — Progress Notes (Signed)
VINAYAK, BOBIER ([id number]) Visit Report for 06/10/2020 Arrival Information Details Patient Name: Shane Bean, Shane Bean Date of Service: 06/10/2020 1:00 PM Medical Record Number: [id number] Patient Account Number: [id number] Date of Birth/Sex: November 10, 1955 (64 y.o. M) Treating RN: Dolan Amen Primary Care Ra Pfiester: Reeves Dam Other Clinician: Referring Mazi Schuff: Reeves Dam Treating Lon Klippel/Extender: Tito Dine in Treatment: 3 Visit Information History Since Last Visit Added or deleted any medications: Yes Patient Arrived: Ambulatory Has Dressing in Place as Prescribed: No Arrival Time: 13:22 Pain Present Now: Yes Accompanied By: self Transfer Assistance: None Patient Identification Verified: Yes Secondary Verification Process Completed: Yes Patient Requires Transmission-Based No Precautions: Patient Has Alerts: Yes Patient Alerts: Patient on Blood Thinner DIABETIC Notes Compression wrap was rolled all the way down Electronic Signature(s) Signed: 06/10/2020 1:42:32 PM By: Georges Mouse, Minus Breeding RN Entered By: Georges Mouse, Minus Breeding on 06/10/2020 13:42:32 Shane Bean ([id number]) -------------------------------------------------------------------------------- Clinic Level of Care Assessment Details Patient Name: Shane Bean Date of Service: 06/10/2020 1:00 PM Medical Record Number: [id number] Patient Account Number: [id number] Date of Birth/Sex: 11/05/1955 (64 y.o. M) Treating RN: Cornell Barman Primary Care Athziri Freundlich: Reeves Dam Other Clinician: Referring Vivianna Piccini: Reeves Dam Treating Lakevia Perris/Extender: Tito Dine in Treatment: 3 Clinic Level of Care Assessment Items TOOL 4 Quantity Score []  - Use when only an EandM is performed on FOLLOW-UP visit 0 ASSESSMENTS - Nursing Assessment / Reassessment X - Reassessment of Co-morbidities (includes updates in patient status) 1 10 X- 1 5 Reassessment of Adherence to Treatment  Plan ASSESSMENTS - Wound and Skin Assessment / Reassessment X - Simple Wound Assessment / Reassessment - one wound 1 5 []  - 0 Complex Wound Assessment / Reassessment - multiple wounds []  - 0 Dermatologic / Skin Assessment (not related to wound area) ASSESSMENTS - Focused Assessment X - Circumferential Edema Measurements - multi extremities 1 5 []  - 0 Nutritional Assessment / Counseling / Intervention []  - 0 Lower Extremity Assessment (monofilament, tuning fork, pulses) []  - 0 Peripheral Arterial Disease Assessment (using hand held doppler) ASSESSMENTS - Ostomy and/or Continence Assessment and Care []  - Incontinence Assessment and Management 0 []  - 0 Ostomy Care Assessment and Management (repouching, etc.) PROCESS - Coordination of Care X - Simple Patient / Family Education for ongoing care 1 15 []  - 0 Complex (extensive) Patient / Family Education for ongoing care X- 1 10 Staff obtains Consents, Records, Test Results / Process Orders []  - 0 Staff telephones HHA, Nursing Homes / Clarify orders / etc []  - 0 Routine Transfer to another Facility (non-emergent condition) []  - 0 Routine Hospital Admission (non-emergent condition) []  - 0 New Admissions / Biomedical engineer / Ordering NPWT, Apligraf, etc. []  - 0 Emergency Hospital Admission (emergent condition) X- 1 10 Simple Discharge Coordination []  - 0 Complex (extensive) Discharge Coordination PROCESS - Special Needs []  - Pediatric / Minor Patient Management 0 []  - 0 Isolation Patient Management []  - 0 Hearing / Language / Visual special needs []  - 0 Assessment of Community assistance (transportation, D/C planning, etc.) []  - 0 Additional assistance / Altered mentation []  - 0 Support Surface(s) Assessment (bed, cushion, seat, etc.) INTERVENTIONS - Wound Cleansing / Measurement Doom, Bane V. ([id number]) X- 1 5 Simple Wound Cleansing - one wound []  - 0 Complex Wound Cleansing - multiple wounds X- 1  5 Wound Imaging (photographs - any number of wounds) []  - 0 Wound Tracing (instead of photographs) X- 1 5 Simple Wound Measurement - one wound []  - 0 Complex Wound Measurement -  multiple wounds INTERVENTIONS - Wound Dressings []  - Small Wound Dressing one or multiple wounds 0 []  - 0 Medium Wound Dressing one or multiple wounds X- 1 20 Large Wound Dressing one or multiple wounds []  - 0 Application of Medications - topical []  - 0 Application of Medications - injection INTERVENTIONS - Miscellaneous []  - External ear exam 0 []  - 0 Specimen Collection (cultures, biopsies, blood, body fluids, etc.) []  - 0 Specimen(s) / Culture(s) sent or taken to Lab for analysis []  - 0 Patient Transfer (multiple staff / Civil Service fast streamer / Similar devices) []  - 0 Simple Staple / Suture removal (25 or less) []  - 0 Complex Staple / Suture removal (26 or more) []  - 0 Hypo / Hyperglycemic Management (close monitor of Blood Glucose) []  - 0 Ankle / Brachial Index (ABI) - do not check if billed separately X- 1 5 Vital Signs Has the patient been seen at the hospital within the last three years: Yes Total Score: 100 Level Of Care: New/Established - Level 3 Electronic Signature(s) Signed: 06/10/2020 4:36:28 PM By: Gretta Cool, BSN, RN, CWS, Kim RN, BSN Entered By: Gretta Cool, BSN, RN, CWS, Kim on 06/10/2020 13:49:14 Shane Bean ([id number]) -------------------------------------------------------------------------------- Encounter Discharge Information Details Patient Name: Shane Bean Date of Service: 06/10/2020 1:00 PM Medical Record Number: [id number] Patient Account Number: [id number] Date of Birth/Sex: [birth year] (64 y.o. M) Treating RN: Dolan Amen Primary Care Shivani Barrantes: Reeves Dam Other Clinician: Referring Abir Craine: Reeves Dam Treating Tony Granquist/Extender: Tito Dine in Treatment: 3 Encounter Discharge Information Items Discharge Condition: Stable Ambulatory Status:  Ambulatory Discharge Destination: Home Transportation: Private Auto Accompanied By: self Schedule Follow-up Appointment: Yes Clinical Summary of Care: Electronic Signature(s) Signed: 06/10/2020 4:25:56 PM By: Georges Mouse, Minus Breeding RN Entered By: Georges Mouse, Minus Breeding on 06/10/2020 14:00:41 Shane Bean ([id number]) -------------------------------------------------------------------------------- Lower Extremity Assessment Details Patient Name: Shane Bean Date of Service: 06/10/2020 1:00 PM Medical Record Number: [id number] Patient Account Number: [id number] Date of Birth/Sex: [birth year] (64 y.o. M) Treating RN: Dolan Amen Primary Care Stavroula Rohde: Reeves Dam Other Clinician: Referring Latrelle Bazar: Reeves Dam Treating Genoveva Singleton/Extender: Tito Dine in Treatment: 3 Edema Assessment Assessed: [Left: Yes] [Right: No] Edema: [Left: Ye] [Right: s] Calf Left: Right: Point of Measurement: 34 cm From Medial Instep 44.5 cm Ankle Left: Right: Point of Measurement: 10 cm From Medial Instep 25.5 cm Vascular Assessment Pulses: Dorsalis Pedis Palpable: [Left:Yes] Electronic Signature(s) Signed: 06/10/2020 4:25:56 PM By: Georges Mouse, Minus Breeding RN Entered By: Georges Mouse, Minus Breeding on 06/10/2020 13:35:14 Shane Bean ([id number]) -------------------------------------------------------------------------------- Multi Wound Chart Details Patient Name: Shane Bean Date of Service: 06/10/2020 1:00 PM Medical Record Number: [id number] Patient Account Number: [id number] Date of Birth/Sex: 01/07/1955 (64 y.o. M) Treating RN: Cornell Barman Primary Care Berline Semrad: Reeves Dam Other Clinician: Referring Miski Feldpausch: Reeves Dam Treating Alexsandra Shontz/Extender: Tito Dine in Treatment: 3 Vital Signs Height(in): 70 Pulse(bpm): 51 Weight(lbs): 290 Blood Pressure(mmHg): 146/83 Body Mass Index(BMI): 42 Temperature(F): 99.4 Respiratory Rate(breaths/min):  18 Photos: [N/A:N/A] Wound Location: Left, Posterior Lower Leg N/A N/A Wounding Event: Gradually Appeared N/A N/A Primary Etiology: Diabetic Wound/Ulcer of the Lower N/A N/A Extremity Comorbid History: Hypertension, Type II Diabetes, N/A N/A Neuropathy Date Acquired: 04/08/2020 N/A N/A Weeks of Treatment: 3 N/A N/A Wound Status: Open N/A N/A Measurements L x W x D (cm) 3.8x4x0.1 N/A N/A Area (cm) : 11.938 N/A N/A Volume (cm) : 1.194 N/A N/A % Reduction in Area: -8.60% N/A N/A % Reduction in Volume: -8.50% N/A N/A Classification: Grade 2 N/A  N/A Exudate Amount: Medium N/A N/A Exudate Type: Serosanguineous N/A N/A Exudate Color: red, brown N/A N/A Granulation Amount: Small (1-33%) N/A N/A Granulation Quality: Red N/A N/A Necrotic Amount: Large (67-100%) N/A N/A Necrotic Tissue: Eschar, Adherent Slough N/A N/A Exposed Structures: Fat Layer (Subcutaneous Tissue): N/A N/A Yes Fascia: No Tendon: No Muscle: No Joint: No Bone: No Epithelialization: Small (1-33%) N/A N/A Treatment Notes Electronic Signature(s) Signed: 06/10/2020 4:14:58 PM By: Linton Ham MD Entered By: Linton Ham on 06/10/2020 13:50:04 Shane Bean ([id number]) -------------------------------------------------------------------------------- [place] Details Patient Name: Shane Bean Date of Service: 06/10/2020 1:00 PM Medical Record Number: [id number] Patient Account Number: [id number] Date of Birth/Sex: 11-Apr-1955 (64 y.o. M) Treating RN: Cornell Barman Primary Care Kaidyn Hernandes: Reeves Dam Other Clinician: Referring Merlina Marchena: Reeves Dam Treating Donovan Gatchel/Extender: Tito Dine in Treatment: 3 Active Inactive Nutrition Nursing Diagnoses: Imbalanced nutrition Impaired glucose control: actual or potential Potential for alteratiion in Nutrition/Potential for imbalanced nutrition Goals: Patient/caregiver agrees to and verbalizes understanding of need to obtain  nutritional consultation Date Initiated: 05/18/2020 Target Resolution Date: 06/19/2020 Goal Status: Active Interventions: Assess HgA1c results as ordered upon admission and as needed Assess patient nutrition upon admission and as needed per policy Provide education on elevated blood sugars and impact on wound healing Notes: Wound/Skin Impairment Nursing Diagnoses: Impaired tissue integrity Knowledge deficit related to smoking impact on wound healing Knowledge deficit related to ulceration/compromised skin integrity Goals: Patient/caregiver will verbalize understanding of skin care regimen Date Initiated: 05/18/2020 Target Resolution Date: 06/19/2020 Goal Status: Active Ulcer/skin breakdown will have a volume reduction of 30% by week 4 Date Initiated: 05/18/2020 Target Resolution Date: 06/15/2020 Goal Status: Active Ulcer/skin breakdown will have a volume reduction of 50% by week 8 Date Initiated: 05/18/2020 Target Resolution Date: 07/13/2020 Goal Status: Active Ulcer/skin breakdown will have a volume reduction of 80% by week 12 Date Initiated: 05/18/2020 Target Resolution Date: 08/10/2020 Goal Status: Active Interventions: Assess patient/caregiver ability to obtain necessary supplies Assess patient/caregiver ability to perform ulcer/skin care regimen upon admission and as needed Assess ulceration(s) every visit Notes: Electronic Signature(s) Signed: 06/10/2020 4:36:28 PM By: Gretta Cool, BSN, RN, CWS, Kim RN, BSN Entered By: Gretta Cool, BSN, RN, CWS, Kim on 06/10/2020 13:44:01 Shane Bean ([id number]) -------------------------------------------------------------------------------- Pain Assessment Details Patient Name: Shane Bean Date of Service: 06/10/2020 1:00 PM Medical Record Number: [id number] Patient Account Number: [id number] Date of Birth/Sex: 12/05/1955 (64 y.o. M) Treating RN: Dolan Amen Primary Care Miranda Garber: Reeves Dam Other Clinician: Referring Reighan Hipolito: Reeves Dam Treating Karsyn Rochin/Extender: Tito Dine in Treatment: 3 Active Problems Location of Pain Severity and Description of Pain Patient Has Paino Yes Site Locations Pain Location: Pain in Ulcers Rate the pain. Current Pain Level: 8 Pain Management and Medication Current Pain Management: Electronic Signature(s) Signed: 06/10/2020 1:41:42 PM By: Georges Mouse, Minus Breeding RN Entered By: Georges Mouse, [place] on 06/10/2020 13:41:42 Shane Bean ([id number]) -------------------------------------------------------------------------------- Patient/Caregiver Education Details Patient Name: Shane Bean Date of Service: 06/10/2020 1:00 PM Medical Record Number: [id number] Patient Account Number: [id number] Date of Birth/Gender: 07/21/1955 (64 y.o. M) Treating RN: Cornell Barman Primary Care Physician: Reeves Dam Other Clinician: Referring Physician: Reeves Dam Treating Physician/Extender: Tito Dine in Treatment: 3 Education Assessment Education Provided To: Patient Education Topics Provided Venous: Handouts: Controlling Swelling with Multilayered Compression Wraps Methods: Demonstration, Explain/Verbal Responses: State content correctly Wound/Skin Impairment: Handouts: Caring for Your Ulcer Methods: Demonstration, Explain/Verbal Responses: State content correctly Electronic Signature(s) Signed: 06/10/2020 4:36:28 PM By: Gretta Cool, BSN, RN, CWS, Kim  RN, BSN Entered By: Gretta Cool, BSN, RN, CWS, Kim on 06/10/2020 [place], [place]. ([id number]) -------------------------------------------------------------------------------- Wound Assessment Details Patient Name: Shane Bean Date of Service: 06/10/2020 1:00 PM Medical Record Number: [id number] Patient Account Number: [id number] Date of Birth/Sex: 1955/11/08 (64 y.o. M) Treating RN: Dolan Amen Primary Care Yarenis Cerino: Reeves Dam Other Clinician: Referring Roan Miklos: Reeves Dam Treating  Renu Asby/Extender: Tito Dine in Treatment: 3 Wound Status Wound Number: 1 Primary Etiology: Diabetic Wound/Ulcer of the Lower Extremity Wound Location: Left, Posterior Lower Leg Wound Status: Open Wounding Event: Gradually Appeared Comorbid History: Hypertension, Type II Diabetes, Neuropathy Date Acquired: 04/08/2020 Weeks Of Treatment: 3 Clustered Wound: No Photos Wound Measurements Length: (cm) 3.8 Width: (cm) 4 Depth: (cm) 0.1 Area: (cm) 11.938 Volume: (cm) 1.194 % Reduction in Area: -8.6% % Reduction in Volume: -8.5% Epithelialization: Small (1-33%) Tunneling: No Undermining: No Wound Description Classification: Grade 2 Exudate Amount: Medium Exudate Type: Serosanguineous Exudate Color: red, brown Foul Odor After Cleansing: No Slough/Fibrino Yes Wound Bed Granulation Amount: Small (1-33%) Exposed Structure Granulation Quality: Red Fascia Exposed: No Necrotic Amount: Large (67-100%) Fat Layer (Subcutaneous Tissue) Exposed: Yes Necrotic Quality: Eschar, Adherent Slough Tendon Exposed: No Muscle Exposed: No Joint Exposed: No Bone Exposed: No Treatment Notes Wound #1 (Lower Leg) Wound Laterality: Left, Posterior Cleanser Soap and Water Discharge Instruction: Gently cleanse wound with antibacterial soap, rinse and pat dry prior to dressing wounds Peri-Wound Care Shane Bean, Shane Bean ([id number]) Topical Primary Dressing Cutimed Sorbact 1.5x 2.38 (in/in) Discharge Instruction: Moisten with hydrogel-A bacteria- and fungi binding wound dressing, suitable for cavities and fistulas. It is suitable as a wound filler and allows the passage of wound exudate into a secondary dressing. The dressing helps reducing odor and pain and can improve healing. Secondary Dressing ABD Pad 5x9 (in/in) Discharge Instruction: Cover with ABD pad Secured With Kerlix Roll Sterile or Non-Sterile 6-ply 4.5x4 (yd/yd) Discharge Instruction: Apply Kerlix as directed Coban  Cohesive Bandage 4x5 (yds) Stretched Discharge Instruction: Apply Coban as directed. Compression Wrap Compression Stockings Add-Ons Electronic Signature(s) Signed: 06/10/2020 4:25:56 PM By: Georges Mouse, Minus Breeding RN Entered By: Georges Mouse, Minus Breeding on 06/10/2020 13:34:03 Shane Bean ([id number]) -------------------------------------------------------------------------------- Vitals Details Patient Name: Shane Bean Date of Service: 06/10/2020 1:00 PM Medical Record Number: [id number] Patient Account Number: [id number] Date of Birth/Sex: [birth year] (64 y.o. M) Treating RN: Dolan Amen Primary Care Antoney Biven: Reeves Dam Other Clinician: Referring Alper Guilmette: Reeves Dam Treating Kawhi Diebold/Extender: Tito Dine in Treatment: 3 Vital Signs Time Taken: 13:23 Temperature (F): 99.4 Height (in): 70 Pulse (bpm): 83 Weight (lbs): 290 Respiratory Rate (breaths/min): 18 Body Mass Index (BMI): 41.6 Blood Pressure (mmHg): 146/83 Reference Range: 80 - 120 mg / dl Electronic Signature(s) Signed: 06/10/2020 4:25:56 PM By: Georges Mouse, Minus Breeding RN Entered By: Georges Mouse, Minus Breeding on 06/10/2020 13:25:37

## 2020-06-17 ENCOUNTER — Encounter: Payer: Medicare (Managed Care) | Admitting: Internal Medicine

## 2020-06-17 ENCOUNTER — Other Ambulatory Visit: Payer: Self-pay

## 2020-06-17 DIAGNOSIS — E11622 Type 2 diabetes mellitus with other skin ulcer: Secondary | ICD-10-CM | POA: Diagnosis not present

## 2020-06-18 NOTE — Progress Notes (Signed)
DEMONE, LYLES ([id number]) Visit Report for 06/17/2020 Debridement Details Patient Name: Shane Bean, Shane Bean Date of Service: 06/17/2020 1:00 PM Medical Record Number: [id number] Patient Account Number: [id number] Date of Birth/Sex: May 04, 1955 (65 y.o. M) Treating RN: Cornell Barman Primary Care Provider: Reeves Dam Other Clinician: Referring Provider: Reeves Dam Treating Provider/Extender: Tito Dine in Treatment: 4 Debridement Performed for Wound #1 Left,Posterior Lower Leg Assessment: Performed By: Physician Ricard Dillon, MD Debridement Type: Debridement Severity of Tissue Pre Debridement: Fat layer exposed Level of Consciousness (Pre- Awake and Alert procedure): Pre-procedure Verification/Time Out Yes - 13:41 Taken: Pain Control: Lidocaine Total Area Debrided (L x W): 4 (cm) x 4.5 (cm) = 18 (cm) Tissue and other material Non-Viable, Eschar, Subcutaneous debrided: Level: Skin/Subcutaneous Tissue Debridement Description: Excisional Instrument: Curette Bleeding: Minimum Hemostasis Achieved: Pressure Response to Treatment: Procedure was tolerated well Level of Consciousness (Post- Awake and Alert procedure): Post Debridement Measurements of Total Wound Length: (cm) 4 Width: (cm) 4.5 Depth: (cm) 0.1 Volume: (cm) 1.414 Character of Wound/Ulcer Post Debridement: Stable Severity of Tissue Post Debridement: Fat layer exposed Post Procedure Diagnosis Same as Pre-procedure Electronic Signature(s) Signed: 06/17/2020 4:10:05 PM By: Linton Ham MD Signed: 06/17/2020 5:10:04 PM By: Gretta Cool, BSN, RN, CWS, Kim RN, BSN Entered By: Linton Ham on 06/17/2020 13:47:01 Shane Bean ([id number]) -------------------------------------------------------------------------------- HPI Details Patient Name: Shane Bean Date of Service: 06/17/2020 1:00 PM Medical Record Number: [id number] Patient Account Number: [id number] Date of Birth/Sex:  May 16, 1955 (65 y.o. M) Treating RN: Cornell Barman Primary Care Provider: Reeves Dam Other Clinician: Referring Provider: Reeves Dam Treating Provider/Extender: Tito Dine in Treatment: 4 History of Present Illness HPI Description: 05/18/2020 upon evaluation today patient appears for initial evaluation here in clinic concerning issues that he has been having with the wound on his left lateral leg. Fortunately there does not appear to be any signs of obvious an active infection at this time which is great news. With that being said the patient unfortunately is continued to have issues with pain although he tells me it is gotten a lot better. He was on Keflex as well as Bactrim DS which seems to have done a good job there. His most recent hemoglobin A1c was 9.8 that was on 04/14/2020. Subsequently I do feel like that the patient is making progress here. He does have a history of chronic venous insufficiency as well as hypertension. I do not see any need for antibiotics at this point. 5/25; follow-up of the wound on the left posterior calf. This is completely necrotic on the surface. It does not look infected but it is painful. He is a diabetic but I think there is some suggestion here of chronic venous disease as well. We used Iodoflex under compression last week 6/1; again a completely necrotic surface on this with the wound. We have been using Iodoflex under compression he complains of gnawing pain. He has had wounds previously a lot of this looks like chronic venous disease. 6/8; about two thirds of the surface of this wound is still covered in a black necrotic eschar. I changed him from Iodoflex to [place] last week because of complaints of pain. He actually was seen by her neurologist this weeko Peripheral neuropathy as a cause of the pain and they changed him from Neurontin to Lyrica but he still has not had any relief. He says that most of the pain however is in his heel not in  his wound per se. He is a diabetic 6/15.  This is a patient with what looks to be a venous wound on the left lateral lower leg. He is also a diabetic. Currently being worked up for diabetic neuropathy. He complains of pain out of proportion to the size of the wound although to be fair the entire area here was eschared. We have been working to get a viable surface. We are using [employer]) Signed: 06/17/2020 4:10:05 PM By: Linton Ham MD Entered By: Linton Ham on 06/17/2020 13:48:59 Shane Bean ([id number]) -------------------------------------------------------------------------------- Physical Exam Details Patient Name: Shane Bean Date of Service: 06/17/2020 1:00 PM Medical Record Number: [id number] Patient Account Number: [id number] Date of Birth/Sex: 09-Aug-1955 (65 y.o. M) Treating RN: Cornell Barman Primary Care Provider: Reeves Dam Other Clinician: Referring Provider: Reeves Dam Treating Provider/Extender: Tito Dine in Treatment: 4 Constitutional Sitting or standing Blood Pressure is within target range for patient.. Pulse regular and within target range for patient.Marland Kitchen Respirations regular, non- labored and within target range.. Temperature is normal and within the target range for the patient.Marland Kitchen appears in no distress. Cardiovascular Pedal pulses are palpable in his foot both dorsalis pedis and posterior tibial on the left. Notes Wound exam; fairly large wound on the left posterior calf. Probably mostly venous. This was initially 100% covered by a very fibrinous necrotic black surface. I been gradually debriding this although any debridement on this man is exceptionally difficult today no exception. I used a #5 curette I was able to remove some necrotic surface and underlying subcutaneous tissue. We actually have some epithelialization and some parts of this wound Electronic Signature(s) Signed: 06/17/2020 4:10:05 PM By:  Linton Ham MD Entered By: Linton Ham on 06/17/2020 13:56:29 Shane Bean ([id number]) -------------------------------------------------------------------------------- Physician Orders Details Patient Name: Shane Bean Date of Service: 06/17/2020 1:00 PM Medical Record Number: [id number] Patient Account Number: [id number] Date of Birth/Sex: 04/07/1955 (64 y.o. M) Treating RN: Cornell Barman Primary Care Provider: Reeves Dam Other Clinician: Referring Provider: Reeves Dam Treating Provider/Extender: Tito Dine in Treatment: 4 Verbal / Phone Orders: No Diagnosis Coding Follow-up Appointments o Return Appointment in 1 week. o Nurse Visit as needed Bathing/ Shower/ Hygiene o May shower with wound dressing protected with water repellent cover or cast protector. - cover dressing during shower-do not get it wet Edema Control - Lymphedema / Segmental Compressive Device / Other o Elevate, Exercise Daily and Avoid Standing for Long Periods of Time. o Elevate legs to the level of the heart and pump ankles as often as possible o Elevate leg(s) parallel to the floor when sitting. Wound Treatment Wound #1 - Lower Leg Wound Laterality: Left, Posterior Cleanser: Soap and Water Discharge Instructions: Gently cleanse wound with antibacterial soap, rinse and pat dry prior to dressing wounds Primary Dressing: Cutimed Sorbact 1.5x 2.38 (in/in) Discharge Instructions: Moisten with hydrogel-A bacteria- and fungi binding wound dressing, suitable for cavities and fistulas. It is suitable as a wound filler and allows the passage of wound exudate into a secondary dressing. The dressing helps reducing odor and pain and can improve healing. Secondary Dressing: ABD Pad 5x9 (in/in) Discharge Instructions: Cover with ABD pad Secured With: Coban Cohesive Bandage 4x5 (yds) Stretched Discharge Instructions: Apply Coban as directed. Secured With: [employer]  or Non-Sterile 6-ply 4.5x4 (yd/yd) Discharge Instructions: Apply Kerlix as directed Notes May take dressing off before going to appointment to evaluate for neuropathy. Electronic Signature(s) Signed: 06/17/2020 4:10:05 PM By: Linton Ham MD Signed: 06/17/2020 5:10:04 PM By: Gretta Cool, BSN, RN, CWS, Kim  RN, BSN Entered By: Gretta Cool, BSN, RN, CWS, Kim on 06/17/2020 13:45:48 Shane Bean ([id number]) -------------------------------------------------------------------------------- Problem List Details Patient Name: Shane Bean Date of Service: 06/17/2020 1:00 PM Medical Record Number: [id number] Patient Account Number: [id number] Date of Birth/Sex: 1955-02-28 (64 y.o. M) Treating RN: Cornell Barman Primary Care Provider: Reeves Dam Other Clinician: Referring Provider: Reeves Dam Treating Provider/Extender: Tito Dine in Treatment: 4 Active Problems ICD-10 Encounter Code Description Active Date MDM Diagnosis E11.622 Type 2 diabetes mellitus with other skin ulcer 05/18/2020 No Yes I87.2 Venous insufficiency (chronic) (peripheral) 05/18/2020 No Yes 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 layer 05/18/2020 No Yes exposed I10 Essential (primary) hypertension 05/18/2020 No Yes Inactive Problems Resolved Problems Electronic Signature(s) Signed: 06/17/2020 4:10:05 PM By: Linton Ham MD Entered By: Linton Ham on 06/17/2020 [place], [place]. ([id number]) -------------------------------------------------------------------------------- Progress Note Details Patient Name: Shane Bean Date of Service: 06/17/2020 1:00 PM Medical Record Number: [id number] Patient Account Number: [id number] Date of Birth/Sex: 21-Nov-1955 (64 y.o. M) Treating RN: Cornell Barman Primary Care Provider: Reeves Dam Other Clinician: Referring Provider: Reeves Dam Treating Provider/Extender: Tito Dine in Treatment:  4 Subjective History of Present Illness (HPI) 05/18/2020 upon evaluation today patient appears for initial evaluation here in clinic concerning issues that he has been having with the wound on his left lateral leg. Fortunately there does not appear to be any signs of obvious an active infection at this time which is great news. With that being said the patient unfortunately is continued to have issues with pain although he tells me it is gotten a lot better. He was on Keflex as well as Bactrim DS which seems to have done a good job there. His most recent hemoglobin A1c was 9.8 that was on 04/14/2020. Subsequently I do feel like that the patient is making progress here. He does have a history of chronic venous insufficiency as well as hypertension. I do not see any need for antibiotics at this point. 5/25; follow-up of the wound on the left posterior calf. This is completely necrotic on the surface. It does not look infected but it is painful. He is a diabetic but I think there is some suggestion here of chronic venous disease as well. We used Iodoflex under compression last week 6/1; again a completely necrotic surface on this with the wound. We have been using Iodoflex under compression he complains of gnawing pain. He has had wounds previously a lot of this looks like chronic venous disease. 6/8; about two thirds of the surface of this wound is still covered in a black necrotic eschar. I changed him from Iodoflex to [place] last week because of complaints of pain. He actually was seen by her neurologist this weeko Peripheral neuropathy as a cause of the pain and they changed him from Neurontin to Lyrica but he still has not had any relief. He says that most of the pain however is in his heel not in his wound per se. He is a diabetic 6/15. This is a patient with what looks to be a venous wound on the left lateral lower leg. He is also a diabetic. Currently being worked up for diabetic neuropathy. He  complains of pain out of proportion to the size of the wound although to be fair the entire area here was eschared. We have been working to get a viable surface. We are using Sorbact Objective Constitutional Sitting or standing Blood Pressure is within  target range for patient.. Pulse regular and within target range for patient.Marland Kitchen Respirations regular, non- labored and within target range.. Temperature is normal and within the target range for the patient.Marland Kitchen appears in no distress. Vitals Time Taken: 1:03 PM, Height: 70 in, Weight: 290 lbs, BMI: 41.6, Temperature: 98.6 F, Pulse: 77 bpm, Respiratory Rate: 18 breaths/min, Blood Pressure: 136/77 mmHg. Cardiovascular Pedal pulses are palpable in his foot both dorsalis pedis and posterior tibial on the left. General Notes: Wound exam; fairly large wound on the left posterior calf. Probably mostly venous. This was initially 100% covered by a very fibrinous necrotic black surface. I been gradually debriding this although any debridement on this man is exceptionally difficult today no exception. I used a #5 curette I was able to remove some necrotic surface and underlying subcutaneous tissue. We actually have some epithelialization and some parts of this wound Integumentary (Hair, Skin) Wound #1 status is Open. Original cause of wound was Gradually Appeared. The date acquired was: 04/08/2020. The wound has been in treatment 4 weeks. The wound is located on the Left,Posterior Lower Leg. The wound measures 4cm length x 4.5cm width x 0.1cm depth; 14.137cm^2 area and 1.414cm^3 volume. There is Fat Layer (Subcutaneous Tissue) exposed. There is no tunneling or undermining noted. There is a medium amount of serosanguineous drainage noted. There is small (1-33%) red granulation within the wound bed. There is a large (67-100%) amount of necrotic tissue within the wound bed including Eschar and Adherent Slough. Assessment Active Problems ICD-10 Shane Bean, Shane Bean ([id number]) Type 2 diabetes mellitus with other skin ulcer Venous insufficiency (chronic) (peripheral) Non-pressure chronic ulcer of other part of left lower leg with fat layer exposed Essential (primary) hypertension Procedures Wound #1 Pre-procedure diagnosis of Wound #1 is a Diabetic Wound/Ulcer of the Lower Extremity located on the Left,Posterior Lower Leg .Severity of Tissue Pre Debridement is: Fat layer exposed. There was a Excisional Skin/Subcutaneous Tissue Debridement with a total area of 18 sq cm performed by Ricard Dillon, MD. With the following instrument(s): Curette to remove Non-Viable tissue/material. Material removed includes Eschar and Subcutaneous Tissue and after achieving pain control using Lidocaine. A time out was conducted at 13:41, prior to the start of the procedure. A Minimum amount of bleeding was controlled with Pressure. The procedure was tolerated well. Post Debridement Measurements: 4cm length x 4.5cm width x 0.1cm depth; 1.414cm^3 volume. Character of Wound/Ulcer Post Debridement is stable. Severity of Tissue Post Debridement is: Fat layer exposed. Post procedure Diagnosis Wound #1: Same as Pre-Procedure Plan Follow-up Appointments: Return Appointment in 1 week. Nurse Visit as needed Bathing/ Shower/ Hygiene: May shower with wound dressing protected with water repellent cover or cast protector. - cover dressing during shower-do not get it wet Edema Control - Lymphedema / Segmental Compressive Device / Other: Elevate, Exercise Daily and Avoid Standing for Long Periods of Time. Elevate legs to the level of the heart and pump ankles as often as possible Elevate leg(s) parallel to the floor when sitting. General Notes: May take dressing off before going to appointment to evaluate for neuropathy. WOUND #1: - Lower Leg Wound Laterality: Left, Posterior Cleanser: Soap and Water Discharge Instructions: Gently cleanse wound with antibacterial soap, rinse and  pat dry prior to dressing wounds Primary Dressing: Cutimed Sorbact 1.5x 2.38 (in/in) Discharge Instructions: Moisten with hydrogel-A bacteria- and fungi binding wound dressing, suitable for cavities and fistulas. It is suitable as a wound filler and allows the passage of wound exudate into a secondary dressing. The  dressing helps reducing odor and pain and can improve healing. Secondary Dressing: ABD Pad 5x9 (in/in) Discharge Instructions: Cover with ABD pad Secured With: Coban Cohesive Bandage 4x5 (yds) Stretched Discharge Instructions: Apply Coban as directed. Secured With: [employer] or Non-Sterile 6-ply 4.5x4 (yd/yd) Discharge Instructions: Apply Kerlix as directed 1. I am continuing with the Sorbact 2. I do not think the patient has an arterial issue here although we have not formally checked this. ABIs were noncompressible in our clinic 3. Seems to be doing satisfactorily with the kerlix Coban 4. Still complaining of pain. I think there is some thought that this is a neuropathic pain, in fact he says most of the pain he finds is in the left heel that is what keeps him awake at night. This is well separated from the wound we are treating 5. I am still trying to get to a healthy surface on this wound we are making very gradual progress but he does not tolerate mechanical debridement very well Electronic Signature(s) Signed: 06/17/2020 4:10:05 PM By: Linton Ham MD Entered By: Linton Ham on 06/17/2020 13:58:29 Shane Bean ([id number]) -------------------------------------------------------------------------------- SuperBill Details Patient Name: Shane Bean Date of Service: 06/17/2020 Medical Record Number: [id number] Patient Account Number: [id number] Date of Birth/Sex: [birth year] (64 y.o. M) Treating RN: Cornell Barman Primary Care Provider: Reeves Dam Other Clinician: Referring Provider: Reeves Dam Treating Provider/Extender: Tito Dine in Treatment: 4 Diagnosis Coding ICD-10 Codes Code Description E11.622 Type 2 diabetes mellitus with other skin ulcer I87.2 Venous insufficiency (chronic) (peripheral) 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 layer exposed I10 Essential (primary) hypertension Facility Procedures CPT4 Code: [id number] Description: 95747 - DEB SUBQ TISSUE 20 SQ CM/< Modifier: Quantity: 1 CPT4 Code: Description: ICD-10 Diagnosis Description 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 layer I87.2 Venous insufficiency (chronic) (peripheral) Modifier: exposed Quantity: Physician Procedures CPT4 Code: [id number] Description: 11042 - WC PHYS SUBQ TISS 20 SQ CM Modifier: Quantity: 1 CPT4 Code: Description: ICD-10 Diagnosis Description 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 layer I87.2 Venous insufficiency (chronic) (peripheral) Modifier: exposed Quantity: Electronic Signature(s) Signed: 06/17/2020 4:10:05 PM By: Linton Ham MD Entered By: Linton Ham on 06/17/2020 13:58:53

## 2020-06-22 NOTE — Progress Notes (Signed)
Shane Bean, ATIYEH ([id number]) Visit Report for 06/17/2020 Arrival Information Details Patient Name: Shane Bean, Shane Bean Date of Service: 06/17/2020 1:00 PM Medical Record Number: [id number] Patient Account Number: [id number] Date of Birth/Sex: 05/28/1955 (64 y.o. M) Treating RN: Donnamarie Poag Primary Care Dodie Parisi: Reeves Dam Other Clinician: Referring Aydeen Blume: Reeves Dam Treating Drue Harr/Extender: Tito Dine in Treatment: 4 Visit Information History Since Last Visit Added or deleted any medications: No Patient Arrived: Ambulatory Had a fall or experienced change in No Arrival Time: 13:00 activities of daily living that may affect Accompanied By: self risk of falls: Transfer Assistance: None Hospitalized since last visit: No Patient Identification Verified: Yes Has Dressing in Place as Prescribed: Yes Patient Requires Transmission-Based No Has Compression in Place as Prescribed: Yes Precautions: Pain Present Now: Yes Patient Has Alerts: Yes Patient Alerts: Patient on Blood Thinner DIABETIC Electronic Signature(s) Signed: 06/22/2020 11:12:12 AM By: Donnamarie Poag Entered By: Donnamarie Poag on 06/17/2020 13:01:28 Shane Bean ([id number]) -------------------------------------------------------------------------------- Encounter Discharge Information Details Patient Name: Shane Bean Date of Service: 06/17/2020 1:00 PM Medical Record Number: [id number] Patient Account Number: [id number] Date of Birth/Sex: 09/13/1955 (64 y.o. M) Treating RN: Cornell Barman Primary Care Geoffrey Mankin: Reeves Dam Other Clinician: Referring Finley Chevez: Reeves Dam Treating Marqui Formby/Extender: Tito Dine in Treatment: 4 Encounter Discharge Information Items Post Procedure Vitals Discharge Condition: Stable Temperature (F): 98.6 Ambulatory Status: Ambulatory Pulse (bpm): 77 Discharge Destination: Home Respiratory Rate (breaths/min): 18 Transportation: Private  Auto Blood Pressure (mmHg): 136/77 Accompanied By: self Schedule Follow-up Appointment: Yes Clinical Summary of Care: Electronic Signature(s) Signed: 06/18/2020 3:50:18 PM By: Jeanine Luz Entered By: Jeanine Luz on 06/17/2020 14:01:46 Shane Bean ([id number]) -------------------------------------------------------------------------------- Lower Extremity Assessment Details Patient Name: Shane Bean Date of Service: 06/17/2020 1:00 PM Medical Record Number: [id number] Patient Account Number: [id number] Date of Birth/Sex: 22-Mar-1955 (64 y.o. M) Treating RN: Donnamarie Poag Primary Care Camree Wigington: Reeves Dam Other Clinician: Referring Sarahelizabeth Conway: Reeves Dam Treating Manika Hast/Extender: Tito Dine in Treatment: 4 Edema Assessment Assessed: [Left: Yes] [Right: No] [Left: Edema] [Right: :] Calf Left: Right: Point of Measurement: 34 cm From Medial Instep 42 cm Ankle Left: Right: Point of Measurement: 10 cm From Medial Instep 25 cm Knee To Floor Left: Right: From Medial Instep 43 cm Vascular Assessment Pulses: Dorsalis Pedis Palpable: [Left:Yes] Electronic Signature(s) Signed: 06/22/2020 11:12:12 AM By: Donnamarie Poag Entered By: Donnamarie Poag on 06/17/2020 13:09:18 Shane Bean ([id number]) -------------------------------------------------------------------------------- Multi Wound Chart Details Patient Name: Shane Bean Date of Service: 06/17/2020 1:00 PM Medical Record Number: [id number] Patient Account Number: [id number] Date of Birth/Sex: 1955/07/10 (64 y.o. M) Treating RN: Cornell Barman Primary Care Raynelle Fujikawa: Reeves Dam Other Clinician: Referring Kymora Sciara: Reeves Dam Treating Precious Gilchrest/Extender: Tito Dine in Treatment: 4 Vital Signs Height(in): 70 Pulse(bpm): 37 Weight(lbs): 290 Blood Pressure(mmHg): 136/77 Body Mass Index(BMI): 42 Temperature(F): 98.6 Respiratory Rate(breaths/min): 18 Photos:  [N/A:N/A] Wound Location: Left, Posterior Lower Leg N/A N/A Wounding Event: Gradually Appeared N/A N/A Primary Etiology: Diabetic Wound/Ulcer of the Lower N/A N/A Extremity Comorbid History: Hypertension, Type II Diabetes, N/A N/A Neuropathy Date Acquired: 04/08/2020 N/A N/A Weeks of Treatment: 4 N/A N/A Wound Status: Open N/A N/A Measurements L x W x D (cm) 4x4.5x0.1 N/A N/A Area (cm) : 14.137 N/A N/A Volume (cm) : 1.414 N/A N/A % Reduction in Area: -28.60% N/A N/A % Reduction in Volume: -28.50% N/A N/A Classification: Grade 2 N/A N/A Exudate Amount: Medium N/A N/A Exudate Type: Serosanguineous N/A N/A Exudate Color: red, brown N/A N/A Granulation  Amount: Small (1-33%) N/A N/A Granulation Quality: Red N/A N/A Necrotic Amount: Large (67-100%) N/A N/A Necrotic Tissue: Eschar, Adherent Slough N/A N/A Exposed Structures: Fat Layer (Subcutaneous Tissue): N/A N/A Yes Fascia: No Tendon: No Muscle: No Joint: No Bone: No Epithelialization: Small (1-33%) N/A N/A Debridement: Debridement - Selective/Open N/A N/A Wound Pre-procedure Verification/Time 13:41 N/A N/A Out Taken: Pain Control: Lidocaine N/A N/A Tissue Debrided: Necrotic/Eschar N/A N/A Level: Non-Viable Tissue N/A N/A Debridement Area (sq cm): 18 N/A N/A Instrument: Curette N/A N/A Bleeding: Minimum N/A N/A Hemostasis Achieved: Pressure N/A N/A Debridement Treatment Procedure was tolerated well N/A N/A ResponseYORDI, Shane Bean ([id number]) Post Debridement 4x4.5x0.1 N/A N/A Measurements L x W x D (cm) Post Debridement Volume: 1.414 N/A N/A (cm) Procedures Performed: Debridement N/A N/A Treatment Notes Electronic Signature(s) Signed: 06/17/2020 4:10:05 PM By: Linton Ham MD Entered By: Linton Ham on 06/17/2020 13:46:29 Shane Bean ([id number]) -------------------------------------------------------------------------------- Multi-Disciplinary Care Plan Details Patient Name: Shane Bean Date of Service: 06/17/2020 1:00 PM Medical Record Number: [id number] Patient Account Number: [id number] Date of Birth/Sex: 07-14-1955 (64 y.o. M) Treating RN: Cornell Barman Primary Care Koji Niehoff: Reeves Dam Other Clinician: Referring Jena Tegeler: Reeves Dam Treating Broadus Costilla/Extender: Tito Dine in Treatment: 4 Active Inactive Nutrition Nursing Diagnoses: Imbalanced nutrition Impaired glucose control: actual or potential Potential for alteratiion in Nutrition/Potential for imbalanced nutrition Goals: Patient/caregiver agrees to and verbalizes understanding of need to obtain nutritional consultation Date Initiated: 05/18/2020 Target Resolution Date: 06/19/2020 Goal Status: Active Interventions: Assess HgA1c results as ordered upon admission and as needed Assess patient nutrition upon admission and as needed per policy Provide education on elevated blood sugars and impact on wound healing Notes: Wound/Skin Impairment Nursing Diagnoses: Impaired tissue integrity Knowledge deficit related to smoking impact on wound healing Knowledge deficit related to ulceration/compromised skin integrity Goals: Patient/caregiver will verbalize understanding of skin care regimen Date Initiated: 05/18/2020 Target Resolution Date: 06/19/2020 Goal Status: Active Ulcer/skin breakdown will have a volume reduction of 30% by week 4 Date Initiated: 05/18/2020 Target Resolution Date: 06/15/2020 Goal Status: Active Ulcer/skin breakdown will have a volume reduction of 50% by week 8 Date Initiated: 05/18/2020 Target Resolution Date: 07/13/2020 Goal Status: Active Ulcer/skin breakdown will have a volume reduction of 80% by week 12 Date Initiated: 05/18/2020 Target Resolution Date: 08/10/2020 Goal Status: Active Interventions: Assess patient/caregiver ability to obtain necessary supplies Assess patient/caregiver ability to perform ulcer/skin care regimen upon admission and as needed Assess  ulceration(s) every visit Notes: Electronic Signature(s) Signed: 06/17/2020 5:10:04 PM By: Gretta Cool, BSN, RN, CWS, Kim RN, BSN Entered By: Gretta Cool, BSN, RN, CWS, Kim on 06/17/2020 13:39:45 Shane Bean ([id number]) -------------------------------------------------------------------------------- Pain Assessment Details Patient Name: Shane Bean Date of Service: 06/17/2020 1:00 PM Medical Record Number: [id number] Patient Account Number: [id number] Date of Birth/Sex: [birth year] (64 y.o. M) Treating RN: Donnamarie Poag Primary Care Jevin Camino: Reeves Dam Other Clinician: Referring Keelin Neville: Reeves Dam Treating Bonni Neuser/Extender: Tito Dine in Treatment: 4 Active Problems Location of Pain Severity and Description of Pain Patient Has Paino No Site Locations Rate the pain. Current Pain Level: 8 Pain Management and Medication Current Pain Management: Electronic Signature(s) Signed: 06/22/2020 11:12:12 AM By: Donnamarie Poag Entered By: Donnamarie Poag on 06/17/2020 13:03:26 Shane Bean ([id number]) -------------------------------------------------------------------------------- Patient/Caregiver Education Details Patient Name: Shane Bean Date of Service: 06/17/2020 1:00 PM Medical Record Number: [id number] Patient Account Number: [id number] Date of Birth/Gender: 01/16/1955 (64 y.o. M) Treating RN: Cornell Barman Primary Care Physician: Reeves Dam Other Clinician: Referring Physician: Noberto Retort,  Robin Treating Physician/Extender: Tito Dine in Treatment: 4 Education Assessment Education Provided To: Patient Education Topics Provided Wound Debridement: Handouts: Wound Debridement Methods: Demonstration, Explain/Verbal Responses: State content correctly Electronic Signature(s) Signed: 06/17/2020 5:10:04 PM By: Gretta Cool, BSN, RN, CWS, Kim RN, BSN Entered By: Gretta Cool, BSN, RN, CWS, Kim on 06/17/2020 [place], [place].  ([id number]) -------------------------------------------------------------------------------- Wound Assessment Details Patient Name: Shane Bean Date of Service: 06/17/2020 1:00 PM Medical Record Number: [id number] Patient Account Number: [id number] Date of Birth/Sex: [birth year] (64 y.o. M) Treating RN: Donnamarie Poag Primary Care Charlaine [name]: Reeves Dam Other Clinician: Referring Delfina Schreurs: Reeves Dam Treating Caitlin Ainley/Extender: Tito Dine in Treatment: 4 Wound Status Wound Number: 1 Primary Etiology: Diabetic Wound/Ulcer of the Lower Extremity Wound Location: Left, Posterior Lower Leg Wound Status: Open Wounding Event: Gradually Appeared Comorbid History: Hypertension, Type II Diabetes, Neuropathy Date Acquired: 04/08/2020 Weeks Of Treatment: 4 Clustered Wound: No Photos Wound Measurements Length: (cm) 4 Width: (cm) 4.5 Depth: (cm) 0.1 Area: (cm) 14.137 Volume: (cm) 1.414 % Reduction in Area: -28.6% % Reduction in Volume: -28.5% Epithelialization: Small (1-33%) Tunneling: No Undermining: No Wound Description Classification: Grade 2 Exudate Amount: Medium Exudate Type: Serosanguineous Exudate Color: red, brown Foul Odor After Cleansing: No Slough/Fibrino Yes Wound Bed Granulation Amount: Small (1-33%) Exposed Structure Granulation Quality: Red Fascia Exposed: No Necrotic Amount: Large (67-100%) Fat Layer (Subcutaneous Tissue) Exposed: Yes Necrotic Quality: Eschar, Adherent Slough Tendon Exposed: No Muscle Exposed: No Joint Exposed: No Bone Exposed: No Treatment Notes Wound #1 (Lower Leg) Wound Laterality: Left, Posterior Cleanser Soap and Water Discharge Instruction: Gently cleanse wound with antibacterial soap, rinse and pat dry prior to dressing wounds Peri-Wound Care Shane Bean, Shane Bean ([id number]) Topical Primary Dressing Cutimed Sorbact 1.5x 2.38 (in/in) Discharge Instruction: Moisten with hydrogel-A bacteria- and fungi binding wound  dressing, suitable for cavities and fistulas. It is suitable as a wound filler and allows the passage of wound exudate into a secondary dressing. The dressing helps reducing odor and pain and can improve healing. Secondary Dressing ABD Pad 5x9 (in/in) Discharge Instruction: Cover with ABD pad Secured With Kerlix Roll Sterile or Non-Sterile 6-ply 4.5x4 (yd/yd) Discharge Instruction: Apply Kerlix as directed Coban Cohesive Bandage 4x5 (yds) Stretched Discharge Instruction: Apply Coban as directed. Compression Wrap Compression Stockings Add-Ons Electronic Signature(s) Signed: 06/22/2020 11:12:12 AM By: Donnamarie Poag Entered By: Donnamarie Poag on 06/17/2020 13:07:44 Shane Bean ([id number]) -------------------------------------------------------------------------------- Vitals Details Patient Name: Shane Bean Date of Service: 06/17/2020 1:00 PM Medical Record Number: [id number] Patient Account Number: [id number] Date of Birth/Sex: Oct 14, 1955 (64 y.o. M) Treating RN: Donnamarie Poag Primary Care Quanika Solem: Reeves Dam Other Clinician: Referring Mikhaela Zaugg: Reeves Dam Treating Ewell Benassi/Extender: Tito Dine in Treatment: 4 Vital Signs Time Taken: 13:03 Temperature (F): 98.6 Height (in): 70 Pulse (bpm): 77 Weight (lbs): 290 Respiratory Rate (breaths/min): 18 Body Mass Index (BMI): 41.6 Blood Pressure (mmHg): 136/77 Reference Range: 80 - 120 mg / dl Electronic Signature(s) Signed: 06/22/2020 11:12:12 AM By: Donnamarie Poag Entered ByDonnamarie Poag on 06/17/2020 13:03:18

## 2020-06-24 ENCOUNTER — Encounter: Payer: Medicare (Managed Care) | Admitting: Internal Medicine

## 2020-06-24 ENCOUNTER — Other Ambulatory Visit: Payer: Self-pay

## 2020-06-24 DIAGNOSIS — E11622 Type 2 diabetes mellitus with other skin ulcer: Secondary | ICD-10-CM | POA: Diagnosis not present

## 2020-06-24 NOTE — Progress Notes (Signed)
Shane, KLAUSING ([id number]) Visit Report for 06/24/2020 Arrival Information Details Patient Name: Shane Bean, Shane Bean Date of Service: 06/24/2020 1:00 PM Medical Record Number: [id number] Patient Account Number: [id number] Date of Birth/Sex: 05-Oct-1955 (65 y.o. M) Treating RN: Donnamarie Poag Primary Care Arval Brandstetter: Reeves Dam Other Clinician: Referring Leilene Diprima: Reeves Dam Treating Sylvi Rybolt/Extender: Tito Dine in Treatment: 5 Visit Information History Since Last Visit Added or deleted any medications: No Patient Arrived: Ambulatory Had a fall or experienced change in No Arrival Time: 13:05 activities of daily living that may affect Accompanied By: self risk of falls: Transfer Assistance: None Hospitalized since last visit: No Patient Identification Verified: Yes Has Dressing in Place as Prescribed: Yes Secondary Verification Process Completed: Yes Has Compression in Place as Prescribed: Yes Patient Requires Transmission-Based No Pain Present Now: Yes Precautions: Patient Has Alerts: Yes Patient Alerts: Patient on Blood Thinner DIABETIC Electronic Signature(s) Signed: 06/24/2020 4:05:24 PM By: Donnamarie Poag Entered By: Donnamarie Poag on 06/24/2020 Brethren, [place] V. ([id number]) -------------------------------------------------------------------------------- Clinic Level of Care Assessment Details Patient Name: Shane Bean Date of Service: 06/24/2020 1:00 PM Medical Record Number: [id number] Patient Account Number: [id number] Date of Birth/Sex: 15-Jun-1955 (65 y.o. M) Treating RN: Dolan Amen Primary Care Dax Murguia: Reeves Dam Other Clinician: Referring Beauregard Jarrells: Reeves Dam Treating Mairen Wallenstein/Extender: Tito Dine in Treatment: 5 Clinic Level of Care Assessment Items TOOL 4 Quantity Score X - Use when only an EandM is performed on FOLLOW-UP visit 1 0 ASSESSMENTS - Nursing Assessment / Reassessment X - Reassessment of  Co-morbidities (includes updates in patient status) 1 10 X- 1 5 Reassessment of Adherence to Treatment Plan ASSESSMENTS - Wound and Skin Assessment / Reassessment X - Simple Wound Assessment / Reassessment - one wound 1 5 []  - 0 Complex Wound Assessment / Reassessment - multiple wounds []  - 0 Dermatologic / Skin Assessment (not related to wound area) ASSESSMENTS - Focused Assessment []  - Circumferential Edema Measurements - multi extremities 0 []  - 0 Nutritional Assessment / Counseling / Intervention []  - 0 Lower Extremity Assessment (monofilament, tuning fork, pulses) []  - 0 Peripheral Arterial Disease Assessment (using hand held doppler) ASSESSMENTS - Ostomy and/or Continence Assessment and Care []  - Incontinence Assessment and Management 0 []  - 0 Ostomy Care Assessment and Management (repouching, etc.) PROCESS - Coordination of Care X - Simple Patient / Family Education for ongoing care 1 15 []  - 0 Complex (extensive) Patient / Family Education for ongoing care []  - 0 Staff obtains Programmer, systems, Records, Test Results / Process Orders []  - 0 Staff telephones HHA, Nursing Homes / Clarify orders / etc []  - 0 Routine Transfer to another Facility (non-emergent condition) []  - 0 Routine Hospital Admission (non-emergent condition) []  - 0 New Admissions / Biomedical engineer / Ordering NPWT, Apligraf, etc. []  - 0 Emergency Hospital Admission (emergent condition) X- 1 10 Simple Discharge Coordination []  - 0 Complex (extensive) Discharge Coordination PROCESS - Special Needs []  - Pediatric / Minor Patient Management 0 []  - 0 Isolation Patient Management []  - 0 Hearing / Language / Visual special needs []  - 0 Assessment of Community assistance (transportation, D/C planning, etc.) []  - 0 Additional assistance / Altered mentation []  - 0 Support Surface(s) Assessment (bed, cushion, seat, etc.) INTERVENTIONS - Wound Cleansing / Measurement Abend, Melchor V. ([id number]) X-  1 5 Simple Wound Cleansing - one wound []  - 0 Complex Wound Cleansing - multiple wounds X- 1 5 Wound Imaging (photographs - any number of wounds) []  - 0 Wound Tracing (instead  of photographs) X- 1 5 Simple Wound Measurement - one wound []  - 0 Complex Wound Measurement - multiple wounds INTERVENTIONS - Wound Dressings []  - Small Wound Dressing one or multiple wounds 0 X- 1 15 Medium Wound Dressing one or multiple wounds []  - 0 Large Wound Dressing one or multiple wounds []  - 0 Application of Medications - topical []  - 0 Application of Medications - injection INTERVENTIONS - Miscellaneous []  - External ear exam 0 []  - 0 Specimen Collection (cultures, biopsies, blood, body fluids, etc.) []  - 0 Specimen(s) / Culture(s) sent or taken to Lab for analysis []  - 0 Patient Transfer (multiple staff / Civil Service fast streamer / Similar devices) []  - 0 Simple Staple / Suture removal (25 or less) []  - 0 Complex Staple / Suture removal (26 or more) []  - 0 Hypo / Hyperglycemic Management (close monitor of Blood Glucose) []  - 0 Ankle / Brachial Index (ABI) - do not check if billed separately X- 1 5 Vital Signs Has the patient been seen at the hospital within the last three years: Yes Total Score: 80 Level Of Care: New/Established - Level 3 Electronic Signature(s) Signed: 06/24/2020 4:33:50 PM By: Georges Mouse, Minus Breeding RN Entered By: Georges Mouse, Minus Breeding on 06/24/2020 13:27:02 Shane Bean ([id number]) -------------------------------------------------------------------------------- Encounter Discharge Information Details Patient Name: Shane Bean Date of Service: 06/24/2020 1:00 PM Medical Record Number: [id number] Patient Account Number: [id number] Date of Birth/Sex: [birth year] (65 y.o. M) Treating RN: Donnamarie Poag Primary Care Italia Wolfert: Reeves Dam Other Clinician: Referring Chianna Spirito: Reeves Dam Treating Hartlee Amedee/Extender: Tito Dine in Treatment:  5 Encounter Discharge Information Items Discharge Condition: Stable Ambulatory Status: Ambulatory Discharge Destination: Home Transportation: Private Auto Accompanied By: self Schedule Follow-up Appointment: Yes Clinical Summary of Care: Electronic Signature(s) Signed: 06/24/2020 4:05:24 PM By: Donnamarie Poag Entered By: Donnamarie Poag on 06/24/2020 13:57:22 Shane Bean ([id number]) -------------------------------------------------------------------------------- Lower Extremity Assessment Details Patient Name: Shane Bean Date of Service: 06/24/2020 1:00 PM Medical Record Number: [id number] Patient Account Number: [id number] Date of Birth/Sex: 30-Jan-1955 (64 y.o. M) Treating RN: Donnamarie Poag Primary Care Klare Criss: Reeves Dam Other Clinician: Referring Hakan Nudelman: Reeves Dam Treating Taevyn Hausen/Extender: Ricard Dillon Weeks in Treatment: 5 Edema Assessment Assessed: [Left: Yes] [Right: No] [Left: Edema] [Right: :] Calf Left: Right: Point of Measurement: 34 cm From Medial Instep 46 cm Ankle Left: Right: Point of Measurement: 10 cm From Medial Instep 24 cm Vascular Assessment Pulses: Dorsalis Pedis Palpable: [Left:Yes] Electronic Signature(s) Signed: 06/24/2020 4:05:24 PM By: Donnamarie Poag Entered By: Donnamarie Poag on 06/24/2020 13:12:03 Shane Bean ([id number]) -------------------------------------------------------------------------------- Multi Wound Chart Details Patient Name: Shane Bean Date of Service: 06/24/2020 1:00 PM Medical Record Number: [id number] Patient Account Number: [id number] Date of Birth/Sex: Dec 16, 1955 (64 y.o. M) Treating RN: Dolan Amen Primary Care Jaydi Bray: Reeves Dam Other Clinician: Referring Isra Lindy: Reeves Dam Treating Damari Suastegui/Extender: Tito Dine in Treatment: 5 Vital Signs Height(in): 70 Pulse(bpm): 72 Weight(lbs): 290 Blood Pressure(mmHg): 148/78 Body Mass Index(BMI): 42 Temperature(F):  98.6 Respiratory Rate(breaths/min): 18 Photos: [N/A:N/A] Wound Location: Left, Posterior Lower Leg N/A N/A Wounding Event: Gradually Appeared N/A N/A Primary Etiology: Diabetic Wound/Ulcer of the Lower N/A N/A Extremity Comorbid History: Hypertension, Type II Diabetes, N/A N/A Neuropathy Date Acquired: 04/08/2020 N/A N/A Weeks of Treatment: 5 N/A N/A Wound Status: Open N/A N/A Measurements L x W x D (cm) 3.5x6x0.2 N/A N/A Area (cm) : 16.493 N/A N/A Volume (cm) : 3.299 N/A N/A % Reduction in Area: -50.00% N/A N/A % Reduction in Volume: -199.90%  N/A N/A Classification: Grade 2 N/A N/A Exudate Amount: Medium N/A N/A Exudate Type: Purulent N/A N/A Exudate Color: yellow, brown, green N/A N/A Granulation Amount: Small (1-33%) N/A N/A Granulation Quality: Red N/A N/A Necrotic Amount: Large (67-100%) N/A N/A Necrotic Tissue: Eschar, Adherent Slough N/A N/A Exposed Structures: Fat Layer (Subcutaneous Tissue): N/A N/A Yes Fascia: No Tendon: No Muscle: No Joint: No Bone: No Epithelialization: Small (1-33%) N/A N/A Treatment Notes Electronic Signature(s) Signed: 06/24/2020 4:26:18 PM By: Linton Ham MD Entered By: Linton Ham on 06/24/2020 13:29:00 Shane Bean ([id number]) -------------------------------------------------------------------------------- Multi-Disciplinary Care Plan Details Patient Name: Shane Bean Date of Service: 06/24/2020 1:00 PM Medical Record Number: [id number] Patient Account Number: [id number] Date of Birth/Sex: Oct 08, 1955 (64 y.o. M) Treating RN: Dolan Amen Primary Care Walter Min: Reeves Dam Other Clinician: Referring Sasha Rogel: Reeves Dam Treating Mansfield Dann/Extender: Tito Dine in Treatment: 5 Active Inactive Nutrition Nursing Diagnoses: Imbalanced nutrition Impaired glucose control: actual or potential Potential for alteratiion in Nutrition/Potential for imbalanced nutrition Goals: Patient/caregiver agrees  to and verbalizes understanding of need to obtain nutritional consultation Date Initiated: 05/18/2020 Target Resolution Date: 06/19/2020 Goal Status: Active Interventions: Assess HgA1c results as ordered upon admission and as needed Assess patient nutrition upon admission and as needed per policy Provide education on elevated blood sugars and impact on wound healing Notes: Wound/Skin Impairment Nursing Diagnoses: Impaired tissue integrity Knowledge deficit related to smoking impact on wound healing Knowledge deficit related to ulceration/compromised skin integrity Goals: Patient/caregiver will verbalize understanding of skin care regimen Date Initiated: 05/18/2020 Target Resolution Date: 06/19/2020 Goal Status: Active Ulcer/skin breakdown will have a volume reduction of 30% by week 4 Date Initiated: 05/18/2020 Target Resolution Date: 06/15/2020 Goal Status: Active Ulcer/skin breakdown will have a volume reduction of 50% by week 8 Date Initiated: 05/18/2020 Target Resolution Date: 07/13/2020 Goal Status: Active Ulcer/skin breakdown will have a volume reduction of 80% by week 12 Date Initiated: 05/18/2020 Target Resolution Date: 08/10/2020 Goal Status: Active Interventions: Assess patient/caregiver ability to obtain necessary supplies Assess patient/caregiver ability to perform ulcer/skin care regimen upon admission and as needed Assess ulceration(s) every visit Notes: Electronic Signature(s) Signed: 06/24/2020 4:33:50 PM By: Georges Mouse, Minus Breeding RN Entered By: Georges Mouse, Minus Breeding on 06/24/2020 13:19:22 Shane Bean ([id number]) -------------------------------------------------------------------------------- Pain Assessment Details Patient Name: Shane Bean Date of Service: 06/24/2020 1:00 PM Medical Record Number: [id number] Patient Account Number: [id number] Date of Birth/Sex: 1955-10-22 (64 y.o. M) Treating RN: Donnamarie Poag Primary Care Chelsey Redondo: Reeves Dam Other  Clinician: Referring Gerber Penza: Reeves Dam Treating Dracen Reigle/Extender: Tito Dine in Treatment: 5 Active Problems Location of Pain Severity and Description of Pain Patient Has Paino Yes Site Locations Pain Location: Pain in Ulcers Rate the pain. Current Pain Level: 8 Pain Management and Medication Current Pain Management: Electronic Signature(s) Signed: 06/24/2020 4:05:24 PM By: Donnamarie Poag Entered By: Donnamarie Poag on 06/24/2020 13:06:35 Shane Bean ([id number]) -------------------------------------------------------------------------------- Patient/Caregiver Education Details Patient Name: Shane Bean Date of Service: 06/24/2020 1:00 PM Medical Record Number: [id number] Patient Account Number: [id number] Date of Birth/Gender: 1955/01/07 (64 y.o. M) Treating RN: Dolan Amen Primary Care Physician: Reeves Dam Other Clinician: Referring Physician: Reeves Dam Treating Physician/Extender: Tito Dine in Treatment: 5 Education Assessment Education Provided To: Patient Education Topics Provided Wound/Skin Impairment: Methods: Explain/Verbal Responses: State content correctly Electronic Signature(s) Signed: 06/24/2020 4:33:50 PM By: Georges Mouse, Minus Breeding RN Entered By: Georges Mouse, Minus Breeding on 06/24/2020 13:27:13 Shane Bean ([id number]) -------------------------------------------------------------------------------- Wound Assessment Details Patient Name: Shane Bean Date of  Service: 06/24/2020 1:00 PM Medical Record Number: [id number] Patient Account Number: [id number] Date of Birth/Sex: September 23, 1955 (64 y.o. M) Treating RN: Donnamarie Poag Primary Care Aubriel Khanna: Reeves Dam Other Clinician: Referring Aaniya Sterba: Reeves Dam Treating Keane Martelli/Extender: Tito Dine in Treatment: 5 Wound Status Wound Number: 1 Primary Etiology: Diabetic Wound/Ulcer of the Lower Extremity Wound Location: Left, Posterior  Lower Leg Wound Status: Open Wounding Event: Gradually Appeared Comorbid History: Hypertension, Type II Diabetes, Neuropathy Date Acquired: 04/08/2020 Weeks Of Treatment: 5 Clustered Wound: No Photos Wound Measurements Length: (cm) 3.5 Width: (cm) 6 Depth: (cm) 0.2 Area: (cm) 16.493 Volume: (cm) 3.299 % Reduction in Area: -50% % Reduction in Volume: -199.9% Epithelialization: Small (1-33%) Tunneling: No Undermining: No Wound Description Classification: Grade 2 Exudate Amount: Medium Exudate Type: Purulent Exudate Color: yellow, brown, green Foul Odor After Cleansing: No Slough/Fibrino Yes Wound Bed Granulation Amount: Small (1-33%) Exposed Structure Granulation Quality: Red Fascia Exposed: No Necrotic Amount: Large (67-100%) Fat Layer (Subcutaneous Tissue) Exposed: Yes Necrotic Quality: Eschar, Adherent Slough Tendon Exposed: No Muscle Exposed: No Joint Exposed: No Bone Exposed: No Treatment Notes Wound #1 (Lower Leg) Wound Laterality: Left, Posterior Cleanser Soap and Water Discharge Instruction: Gently cleanse wound with antibacterial soap, rinse and pat dry prior to dressing wounds Peri-Wound Care Triamcinolone Acetonide Cream, 0.1%, 15 (g) tube Dy, Jashua V. ([id number]) Topical Primary Dressing Hydrofera Blue Ready Transfer Foam, 2.5x2.5 (in/in) Discharge Instruction: Apply Hydrofera Blue Ready to wound bed as directed Secondary Dressing ABD Pad 5x9 (in/in) Discharge Instruction: Cover with ABD pad Secured With Coban Cohesive Bandage 4x5 (yds) Stretched Discharge Instruction: Apply Coban from base of toes to 3-finger-widths below knee Kerlix Roll Sterile or Non-Sterile 6-ply 4.5x4 (yd/yd) Discharge Instruction: Apply Kerlix from base of toes to 3-finger-widths below knee Compression Wrap Compression Stockings Add-Ons Electronic Signature(s) Signed: 06/24/2020 4:05:24 PM By: Donnamarie Poag Entered By: Donnamarie Poag on 06/24/2020 13:10:28 Shane Bean ([id number]) -------------------------------------------------------------------------------- [place] Details Patient Name: Shane Bean Date of Service: 06/24/2020 1:00 PM Medical Record Number: [id number] Patient Account Number: [id number] Date of Birth/Sex: Mar 19, 1955 (64 y.o. M) Treating RN: Donnamarie Poag Primary Care Kristianne Albin: Reeves Dam Other Clinician: Referring Vishal Sandlin: Reeves Dam Treating Shamra Bradeen/Extender: Tito Dine in Treatment: 5 Vital Signs Time Taken: 13:06 Temperature (F): 98.6 Height (in): 70 Pulse (bpm): 72 Weight (lbs): 290 Respiratory Rate (breaths/min): 18 Body Mass Index (BMI): 41.6 Blood Pressure (mmHg): 148/78 Reference Range: 80 - 120 mg / dl Electronic Signature(s) Signed: 06/24/2020 4:05:24 PM By: Donnamarie Poag Entered ByDonnamarie Poag on 06/24/2020 13:06:26

## 2020-06-24 NOTE — Progress Notes (Signed)
JADD, GASIOR ([id number]) Visit Report for 06/24/2020 HPI Details Patient Name: Shane Bean, Shane Bean Date of Service: 06/24/2020 1:00 PM Medical Record Number: [id number] Patient Account Number: [id number] Date of Birth/Sex: Jan 03, 1956 (64 y.o. M) Treating RN: Dolan Amen Primary Care Provider: Reeves Dam Other Clinician: Referring Provider: Reeves Dam Treating Provider/Extender: Tito Dine in Treatment: 5 History of Present Illness HPI Description: 05/18/2020 upon evaluation today patient appears for initial evaluation here in clinic concerning issues that he has been having with the wound on his left lateral leg. Fortunately there does not appear to be any signs of obvious an active infection at this time which is great news. With that being said the patient unfortunately is continued to have issues with pain although he tells me it is gotten a lot better. He was on Keflex as well as Bactrim DS which seems to have done a good job there. His most recent hemoglobin A1c was 9.8 that was on 04/14/2020. Subsequently I do feel like that the patient is making progress here. He does have a history of chronic venous insufficiency as well as hypertension. I do not see any need for antibiotics at this point. 5/25; follow-up of the wound on the left posterior calf. This is completely necrotic on the surface. It does not look infected but it is painful. He is a diabetic but I think there is some suggestion here of chronic venous disease as well. We used Iodoflex under compression last week 6/1; again a completely necrotic surface on this with the wound. We have been using Iodoflex under compression he complains of gnawing pain. He has had wounds previously a lot of this looks like chronic venous disease. 6/8; about two thirds of the surface of this wound is still covered in a black necrotic eschar. I changed him from Iodoflex to [place] last week because of complaints of pain. He  actually was seen by her neurologist this weeko Peripheral neuropathy as a cause of the pain and they changed him from Neurontin to Lyrica but he still has not had any relief. He says that most of the pain however is in his heel not in his wound per se. He is a diabetic 6/15. This is a patient with what looks to be a venous wound on the left lateral lower leg. He is also a diabetic. Currently being worked up for diabetic neuropathy. He complains of pain out of proportion to the size of the wound although to be fair the entire area here was eschared. We have been working to get a viable surface. We are using Sorbact 6/22; fairly painful wound on the left posterior calf. I am assuming this is been venous he is also a diabetic. His ABI in our clinic was noncompressible however he has easily palpable pulses on his feet. He complains of a lot of pain in the wound also of the left heel and the upper left calf. Some of this may be neuropathy. It is led me to discontinue Iodoflex reduce his compression but he still complains of pain in fact he says he could not take a debridement today. When I first saw this it was completely necrotic surface we have got it down to something that looks a reasonably healthy I have been wondering about biopsying this area however he is on Coumadin and with the pain I have put this off. The major question would be an inflammatory ulcer such as pyoderma. The patient is not aware of how this started.  He does however have a wound history on both legs. Electronic Signature(s) Signed: 06/24/2020 4:26:18 PM By: Linton Ham MD Entered By: Linton Ham on 06/24/2020 13:31:29 Shane Bean ([id number]) -------------------------------------------------------------------------------- Physical Exam Details Patient Name: Shane Bean Date of Service: 06/24/2020 1:00 PM Medical Record Number: [id number] Patient Account Number: [id number] Date of Birth/Sex: 06-Jul-1955 (64  y.o. M) Treating RN: Dolan Amen Primary Care Provider: Reeves Dam Other Clinician: Referring Provider: Reeves Dam Treating Provider/Extender: Tito Dine in Treatment: 5 Constitutional Patient is hypertensive.. Pulse regular and within target range for patient.Marland Kitchen Respirations regular, non-labored and within target range.. Temperature is normal and within the target range for the patient.Marland Kitchen appears in no distress. Cardiovascular Pedal pulses are easily palpable. Notes Wound exam; the surface of this is now granulated. There are some areas that appear to be epithelializing however the entire area looks somewhat angry but not overtly infected. No debridement was done today at the patient's request Electronic Signature(s) Signed: 06/24/2020 4:26:18 PM By: Linton Ham MD Entered By: Linton Ham on 06/24/2020 13:32:34 Shane Bean ([id number]) -------------------------------------------------------------------------------- Physician Orders Details Patient Name: Shane Bean Date of Service: 06/24/2020 1:00 PM Medical Record Number: [id number] Patient Account Number: [id number] Date of Birth/Sex: 1955/12/29 (64 y.o. M) Treating RN: Dolan Amen Primary Care Provider: Reeves Dam Other Clinician: Referring Provider: Reeves Dam Treating Provider/Extender: Tito Dine in Treatment: 5 Verbal / Phone Orders: No Diagnosis Coding ICD-10 Coding Code Description E11.622 Type 2 diabetes mellitus with other skin ulcer I87.2 Venous insufficiency (chronic) (peripheral) 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 layer exposed I10 Essential (primary) hypertension Follow-up Appointments o Return Appointment in 1 week. o Nurse Visit as needed Bathing/ Shower/ Hygiene o May shower with wound dressing protected with water repellent cover or cast protector. - cover dressing during shower-do not get it  wet Edema Control - Lymphedema / Segmental Compressive Device / Other o Elevate, Exercise Daily and Avoid Standing for Long Periods of Time. o Elevate legs to the level of the heart and pump ankles as often as possible o Elevate leg(s) parallel to the floor when sitting. Wound Treatment Wound #1 - Lower Leg Wound Laterality: Left, Posterior Cleanser: Soap and Water 1 x Per Week/30 Days Discharge Instructions: Gently cleanse wound with antibacterial soap, rinse and pat dry prior to dressing wounds Peri-Wound Care: Triamcinolone Acetonide Cream, 0.1%, 15 (g) tube 1 x Per Week/30 Days Primary Dressing: Hydrofera Blue Ready Transfer Foam, 2.5x2.5 (in/in) 1 x Per Week/30 Days Discharge Instructions: Apply Hydrofera Blue Ready to wound bed as directed Secondary Dressing: ABD Pad 5x9 (in/in) 1 x Per Week/30 Days Discharge Instructions: Cover with ABD pad Secured With: Coban Cohesive Bandage 4x5 (yds) Stretched 1 x Per Week/30 Days Discharge Instructions: Apply Coban from base of toes to 3-finger-widths below knee Secured With: Kerlix Roll Sterile or Non-Sterile 6-ply 4.5x4 (yd/yd) 1 x Per Week/30 Days Discharge Instructions: Apply Kerlix from base of toes to 3-finger-widths below knee Services and Therapies o Venous Studies -Bilateral - Venous Reflux studies-AVVS Electronic Signature(s) Signed: 06/24/2020 4:26:18 PM By: Linton Ham MD Signed: 06/24/2020 4:33:50 PM By: Georges Mouse, Minus Breeding RN Entered By: Georges Mouse, Minus Breeding on 06/24/2020 13:31:22 Shane Bean ([id number]) -------------------------------------------------------------------------------- Problem List Details Patient Name: Shane Bean Date of Service: 06/24/2020 1:00 PM Medical Record Number: [id number] Patient Account Number: [id number] Date of Birth/Sex: 1955-12-19 (64 y.o. M) Treating RN: Dolan Amen Primary Care Provider: Reeves Dam Other Clinician: Referring Provider:  Reeves Dam Treating  Provider/Extender: Ricard Dillon Weeks in Treatment: 5 Active Problems ICD-10 Encounter Code Description Active Date MDM Diagnosis E11.622 Type 2 diabetes mellitus with other skin ulcer 05/18/2020 No Yes I87.2 Venous insufficiency (chronic) (peripheral) 05/18/2020 No Yes 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 layer 05/18/2020 No Yes exposed I10 Essential (primary) hypertension 05/18/2020 No Yes Inactive Problems Resolved Problems Electronic Signature(s) Signed: 06/24/2020 4:26:18 PM By: Linton Ham MD Entered By: Linton Ham on 06/24/2020 13:28:51 Shane Bean ([id number]) -------------------------------------------------------------------------------- Progress Note Details Patient Name: Shane Bean Date of Service: 06/24/2020 1:00 PM Medical Record Number: [id number] Patient Account Number: [id number] Date of Birth/Sex: Apr 23, 1955 (64 y.o. M) Treating RN: Dolan Amen Primary Care Provider: Reeves Dam Other Clinician: Referring Provider: Reeves Dam Treating Provider/Extender: Tito Dine in Treatment: 5 Subjective History of Present Illness (HPI) 05/18/2020 upon evaluation today patient appears for initial evaluation here in clinic concerning issues that he has been having with the wound on his left lateral leg. Fortunately there does not appear to be any signs of obvious an active infection at this time which is great news. With that being said the patient unfortunately is continued to have issues with pain although he tells me it is gotten a lot better. He was on Keflex as well as Bactrim DS which seems to have done a good job there. His most recent hemoglobin A1c was 9.8 that was on 04/14/2020. Subsequently I do feel like that the patient is making progress here. He does have a history of chronic venous insufficiency as well as hypertension. I do not see any need for antibiotics at this point. 5/25;  follow-up of the wound on the left posterior calf. This is completely necrotic on the surface. It does not look infected but it is painful. He is a diabetic but I think there is some suggestion here of chronic venous disease as well. We used Iodoflex under compression last week 6/1; again a completely necrotic surface on this with the wound. We have been using Iodoflex under compression he complains of gnawing pain. He has had wounds previously a lot of this looks like chronic venous disease. 6/8; about two thirds of the surface of this wound is still covered in a black necrotic eschar. I changed him from Iodoflex to [place] last week because of complaints of pain. He actually was seen by her neurologist this weeko Peripheral neuropathy as a cause of the pain and they changed him from Neurontin to Lyrica but he still has not had any relief. He says that most of the pain however is in his heel not in his wound per se. He is a diabetic 6/15. This is a patient with what looks to be a venous wound on the left lateral lower leg. He is also a diabetic. Currently being worked up for diabetic neuropathy. He complains of pain out of proportion to the size of the wound although to be fair the entire area here was eschared. We have been working to get a viable surface. We are using Sorbact 6/22; fairly painful wound on the left posterior calf. I am assuming this is been venous he is also a diabetic. His ABI in our clinic was noncompressible however he has easily palpable pulses on his feet. He complains of a lot of pain in the wound also of the left heel and the upper left calf. Some of this may be neuropathy. It is led me to  discontinue Iodoflex reduce his compression but he still complains of pain in fact he says he could not take a debridement today. When I first saw this it was completely necrotic surface we have got it down to something that looks a reasonably healthy I have been wondering about biopsying  this area however he is on Coumadin and with the pain I have put this off. The major question would be an inflammatory ulcer such as pyoderma. The patient is not aware of how this started. He does however have a wound history on both legs. Objective Constitutional Patient is hypertensive.. Pulse regular and within target range for patient.Marland Kitchen Respirations regular, non-labored and within target range.. Temperature is normal and within the target range for the patient.Marland Kitchen appears in no distress. Vitals Time Taken: 1:06 PM, Height: 70 in, Weight: 290 lbs, BMI: 41.6, Temperature: 98.6 F, Pulse: 72 bpm, Respiratory Rate: 18 breaths/min, Blood Pressure: 148/78 mmHg. Cardiovascular Pedal pulses are easily palpable. General Notes: Wound exam; the surface of this is now granulated. There are some areas that appear to be epithelializing however the entire area looks somewhat angry but not overtly infected. No debridement was done today at the patient's request Integumentary (Hair, Skin) Wound #1 status is Open. Original cause of wound was Gradually Appeared. The date acquired was: 04/08/2020. The wound has been in treatment 5 weeks. The wound is located on the Left,Posterior Lower Leg. The wound measures 3.5cm length x 6cm width x 0.2cm depth; 16.493cm^2 area and 3.299cm^3 volume. There is Fat Layer (Subcutaneous Tissue) exposed. There is no tunneling or undermining noted. There is a medium amount of purulent drainage noted. There is small (1-33%) red granulation within the wound bed. There is a large (67-100%) amount of necrotic tissue within the wound bed including Eschar and [place]. Shane Bean, Shane Bean ([id number]) Assessment Active Problems ICD-10 Type 2 diabetes mellitus with other skin ulcer Venous insufficiency (chronic) (peripheral) Non-pressure chronic ulcer of other part of left lower leg with fat layer exposed Essential (primary) hypertension Plan Follow-up Appointments: Return  Appointment in 1 week. Nurse Visit as needed Bathing/ Shower/ Hygiene: May shower with wound dressing protected with water repellent cover or cast protector. - cover dressing during shower-do not get it wet Edema Control - Lymphedema / Segmental Compressive Device / Other: Elevate, Exercise Daily and Avoid Standing for Long Periods of Time. Elevate legs to the level of the heart and pump ankles as often as possible Elevate leg(s) parallel to the floor when sitting. Services and Therapies ordered were: Venous Studies -Bilateral - Venous Reflux studies-[facility] WOUND #1: - Lower Leg Wound Laterality: Left, Posterior Cleanser: Soap and Water 1 x Per Week/30 Days Discharge Instructions: Gently cleanse wound with antibacterial soap, rinse and pat dry prior to dressing wounds Peri-Wound Care: Triamcinolone Acetonide Cream, 0.1%, 15 (g) tube 1 x Per Week/30 Days Primary Dressing: Hydrofera Blue Ready Transfer Foam, 2.5x2.5 (in/in) 1 x Per Week/30 Days Discharge Instructions: Apply Hydrofera Blue Ready to wound bed as directed Secondary Dressing: ABD Pad 5x9 (in/in) 1 x Per Week/30 Days Discharge Instructions: Cover with ABD pad Secured With: Coban Cohesive Bandage 4x5 (yds) Stretched 1 x Per Week/30 Days Discharge Instructions: Apply Coban from base of toes to 3-finger-widths below knee Secured With: Kerlix Roll Sterile or Non-Sterile 6-ply 4.5x4 (yd/yd) 1 x Per Week/30 Days Discharge Instructions: Apply Kerlix from base of toes to 3-finger-widths below knee 1. I have changed the dressing to Hydrofera Blue 2. Liberal TCA 3. I had some thoughts about  biopsying and/or deep culturing however he is on Coumadin and he was resistant to any invasive things with the wound today 4. I am going to order venous reflux studies 5. I think the pain is multifactorial having something to do with the wound but also perhaps neuropathy. He complains of a lot of pain in the heel. This discomfort has led me to just use  kerlix [place] compression Electronic Signature(s) Signed: 06/24/2020 4:26:18 PM By: Linton Ham MD Entered By: Linton Ham on 06/24/2020 13:33:31 Shane Bean ([id number]) -------------------------------------------------------------------------------- SuperBill Details Patient Name: Shane Bean Date of Service: 06/24/2020 Medical Record Number: [id number] Patient Account Number: [id number] Date of Birth/Sex: 09-Apr-1955 (64 y.o. M) Treating RN: Dolan Amen Primary Care Provider: Reeves Dam Other Clinician: Referring Provider: Reeves Dam Treating Provider/Extender: Tito Dine in Treatment: 5 Diagnosis Coding ICD-10 Codes Code Description E11.622 Type 2 diabetes mellitus with other skin ulcer I87.2 Venous insufficiency (chronic) (peripheral) 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 layer exposed I10 Essential (primary) hypertension Facility Procedures CPT4 Code: [id number] Description: 99213 - WOUND CARE VISIT-LEV 3 EST PT Modifier: Quantity: 1 Physician Procedures CPT4 Code: [id number] Description: 36122 - WC PHYS LEVEL 3 - EST PT Modifier: Quantity: 1 CPT4 Code: Description: ICD-10 Diagnosis Description E11.622 Type 2 diabetes mellitus with other skin ulcer I87.2 Venous insufficiency (chronic) (peripheral) 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 lay Modifier: er exposed Quantity: Electronic Signature(s) Signed: 06/24/2020 4:26:18 PM By: Linton Ham MD Entered By: Linton Ham on 06/24/2020 13:33:49

## 2020-07-01 ENCOUNTER — Encounter (HOSPITAL_BASED_OUTPATIENT_CLINIC_OR_DEPARTMENT_OTHER): Payer: Medicare (Managed Care) | Admitting: Internal Medicine

## 2020-07-01 ENCOUNTER — Other Ambulatory Visit: Payer: Self-pay

## 2020-07-01 DIAGNOSIS — L97822 Non-pressure chronic ulcer of other part of left lower leg with fat layer exposed: Secondary | ICD-10-CM | POA: Diagnosis not present

## 2020-07-01 DIAGNOSIS — E11622 Type 2 diabetes mellitus with other skin ulcer: Secondary | ICD-10-CM | POA: Diagnosis not present

## 2020-07-01 DIAGNOSIS — I872 Venous insufficiency (chronic) (peripheral): Secondary | ICD-10-CM

## 2020-07-08 ENCOUNTER — Other Ambulatory Visit: Payer: Self-pay

## 2020-07-08 ENCOUNTER — Encounter: Payer: Medicare (Managed Care) | Attending: Internal Medicine | Admitting: Internal Medicine

## 2020-07-08 ENCOUNTER — Ambulatory Visit (INDEPENDENT_AMBULATORY_CARE_PROVIDER_SITE_OTHER): Payer: Medicare (Managed Care)

## 2020-07-08 ENCOUNTER — Other Ambulatory Visit (INDEPENDENT_AMBULATORY_CARE_PROVIDER_SITE_OTHER): Payer: Self-pay | Admitting: Internal Medicine

## 2020-07-08 DIAGNOSIS — E11622 Type 2 diabetes mellitus with other skin ulcer: Secondary | ICD-10-CM | POA: Insufficient documentation

## 2020-07-08 DIAGNOSIS — I872 Venous insufficiency (chronic) (peripheral): Secondary | ICD-10-CM | POA: Diagnosis not present

## 2020-07-08 DIAGNOSIS — L97822 Non-pressure chronic ulcer of other part of left lower leg with fat layer exposed: Secondary | ICD-10-CM

## 2020-07-08 DIAGNOSIS — E1140 Type 2 diabetes mellitus with diabetic neuropathy, unspecified: Secondary | ICD-10-CM | POA: Diagnosis not present

## 2020-07-08 DIAGNOSIS — I10 Essential (primary) hypertension: Secondary | ICD-10-CM | POA: Diagnosis not present

## 2020-07-08 DIAGNOSIS — L97922 Non-pressure chronic ulcer of unspecified part of left lower leg with fat layer exposed: Secondary | ICD-10-CM | POA: Diagnosis not present

## 2020-07-08 DIAGNOSIS — E1151 Type 2 diabetes mellitus with diabetic peripheral angiopathy without gangrene: Secondary | ICD-10-CM | POA: Diagnosis not present

## 2020-07-08 NOTE — Progress Notes (Signed)
Shane Bean, Shane Bean ([id number]) Visit Report for 07/08/2020 Arrival Information Details Patient Name: Shane Bean, Shane Bean Date of Service: 07/08/2020 3:00 PM Medical Record Number: [id number] Patient Account Number: [id number] Date of Birth/Sex: [birth year] (65 y.o. M) Treating RN: Donnamarie Poag Primary Care Lassie Demorest: Reeves Dam Other Clinician: Referring Jayde Daffin: Reeves Dam Treating Elbony Mcclimans/Extender: Yaakov Guthrie in Treatment: 7 Visit Information History Since Last Visit Added or deleted any medications: No Patient Arrived: Ambulatory Had a fall or experienced change in No Arrival Time: 15:13 activities of daily living that may affect Accompanied By: self risk of falls: Transfer Assistance: None Hospitalized since last visit: No Patient Identification Verified: Yes Has Dressing in Place as Prescribed: Yes Secondary Verification Process Completed: Yes Has Compression in Place as Prescribed: Yes Patient Requires Transmission-Based No Pain Present Now: Yes Precautions: Patient Has Alerts: Yes Patient Alerts: Patient on Blood Thinner DIABETIC Electronic Signature(s) Signed: 07/08/2020 4:25:48 PM By: Donnamarie Poag Entered By: Donnamarie Poag on 07/08/2020 15:15:36 Shane Bean ([id number]) -------------------------------------------------------------------------------- Clinic Level of Care Assessment Details Patient Name: Shane Bean Date of Service: 07/08/2020 3:00 PM Medical Record Number: [id number] Patient Account Number: [id number] Date of Birth/Sex: 1955-03-09 (65 y.o. M) Treating RN: Donnamarie Poag Primary Care Mauria Asquith: Reeves Dam Other Clinician: Referring Jesscia Imm: Reeves Dam Treating Tyce Delcid/Extender: Yaakov Guthrie in Treatment: 7 Clinic Level of Care Assessment Items TOOL 4 Quantity Score []  - Use when only an EandM is performed on FOLLOW-UP visit 0 ASSESSMENTS - Nursing Assessment / Reassessment X - Reassessment of Co-morbidities  (includes updates in patient status) 1 10 X- 1 5 Reassessment of Adherence to Treatment Plan ASSESSMENTS - Wound and Skin Assessment / Reassessment X - Simple Wound Assessment / Reassessment - one wound 1 5 []  - 0 Complex Wound Assessment / Reassessment - multiple wounds []  - 0 Dermatologic / Skin Assessment (not related to wound area) ASSESSMENTS - Focused Assessment []  - Circumferential Edema Measurements - multi extremities 0 []  - 0 Nutritional Assessment / Counseling / Intervention []  - 0 Lower Extremity Assessment (monofilament, tuning fork, pulses) []  - 0 Peripheral Arterial Disease Assessment (using hand held doppler) ASSESSMENTS - Ostomy and/or Continence Assessment and Care []  - Incontinence Assessment and Management 0 []  - 0 Ostomy Care Assessment and Management (repouching, etc.) PROCESS - Coordination of Care X - Simple Patient / Family Education for ongoing care 1 15 []  - 0 Complex (extensive) Patient / Family Education for ongoing care X- 1 10 Staff obtains Programmer, systems, Records, Test Results / Process Orders []  - 0 Staff telephones HHA, Nursing Homes / Clarify orders / etc []  - 0 Routine Transfer to another Facility (non-emergent condition) []  - 0 Routine Hospital Admission (non-emergent condition) []  - 0 New Admissions / Biomedical engineer / Ordering NPWT, Apligraf, etc. []  - 0 Emergency Hospital Admission (emergent condition) X- 1 10 Simple Discharge Coordination []  - 0 Complex (extensive) Discharge Coordination PROCESS - Special Needs []  - Pediatric / Minor Patient Management 0 []  - 0 Isolation Patient Management []  - 0 Hearing / Language / Visual special needs []  - 0 Assessment of Community assistance (transportation, D/C planning, etc.) []  - 0 Additional assistance / Altered mentation []  - 0 Support Surface(s) Assessment (bed, cushion, seat, etc.) INTERVENTIONS - Wound Cleansing / Measurement Shane Bean, Shane V. ([id number]) X- 1 5 Simple  Wound Cleansing - one wound []  - 0 Complex Wound Cleansing - multiple wounds []  - 0 Wound Imaging (photographs - any number of wounds) []  - 0 Wound Tracing (instead of photographs) X-  1 5 Simple Wound Measurement - one wound []  - 0 Complex Wound Measurement - multiple wounds INTERVENTIONS - Wound Dressings X - Small Wound Dressing one or multiple wounds 1 10 []  - 0 Medium Wound Dressing one or multiple wounds []  - 0 Large Wound Dressing one or multiple wounds []  - 0 Application of Medications - topical []  - 0 Application of Medications - injection INTERVENTIONS - Miscellaneous []  - External ear exam 0 []  - 0 Specimen Collection (cultures, biopsies, blood, body fluids, etc.) []  - 0 Specimen(s) / Culture(s) sent or taken to Lab for analysis []  - 0 Patient Transfer (multiple staff / Civil Service fast streamer / Similar devices) []  - 0 Simple Staple / Suture removal (25 or less) []  - 0 Complex Staple / Suture removal (26 or more) []  - 0 Hypo / Hyperglycemic Management (close monitor of Blood Glucose) []  - 0 Ankle / Brachial Index (ABI) - do not check if billed separately X- 1 5 Vital Signs Has the patient been seen at the hospital within the last three years: Yes Total Score: 80 Level Of Care: New/Established - Level 3 Electronic Signature(s) Signed: 07/08/2020 4:25:48 PM By: Donnamarie Poag Entered By: Donnamarie Poag on 07/08/2020 15:36:15 Shane Bean ([id number]) -------------------------------------------------------------------------------- Encounter Discharge Information Details Patient Name: Shane Bean Date of Service: 07/08/2020 3:00 PM Medical Record Number: [id number] Patient Account Number: [id number] Date of Birth/Sex: 1955-07-08 (65 y.o. M) Treating RN: Donnamarie Poag Primary Care Brytnee Bechler: Reeves Dam Other Clinician: Referring Chiniqua Kilcrease: Reeves Dam Treating Carlous Olivares/Extender: Yaakov Guthrie in Treatment: 7 Encounter Discharge Information  Items Discharge Condition: Stable Ambulatory Status: Ambulatory Discharge Destination: Home Transportation: Private Auto Accompanied By: self Schedule Follow-up Appointment: Yes Clinical Summary of Care: Electronic Signature(s) Signed: 07/08/2020 4:25:48 PM By: Donnamarie Poag Entered By: Donnamarie Poag on 07/08/2020 15:45:00 Shane Bean ([id number]) -------------------------------------------------------------------------------- Lower Extremity Assessment Details Patient Name: Shane Bean Date of Service: 07/08/2020 3:00 PM Medical Record Number: [id number] Patient Account Number: [id number] Date of Birth/Sex: 12/15/1955 (65 y.o. M) Treating RN: Donnamarie Poag Primary Care Kieara Schwark: Reeves Dam Other Clinician: Referring Vencent Hauschild: Reeves Dam Treating Bryston Colocho/Extender: Yaakov Guthrie in Treatment: 7 Edema Assessment Assessed: [Left: Yes] [Right: No] Edema: [Left: Ye] [Right: s] Calf Left: Right: Point of Measurement: 34 cm From Medial Instep 46 cm Ankle Left: Right: Point of Measurement: 10 cm From Medial Instep 25 cm Vascular Assessment Pulses: Dorsalis Pedis Palpable: [Left:Yes] Electronic Signature(s) Signed: 07/08/2020 4:25:48 PM By: Donnamarie Poag Entered By: Donnamarie Poag on 07/08/2020 15:25:45 Shane Bean ([id number]) -------------------------------------------------------------------------------- Multi Wound Chart Details Patient Name: Shane Bean Date of Service: 07/08/2020 3:00 PM Medical Record Number: [id number] Patient Account Number: [id number] Date of Birth/Sex: 09-Jul-1955 (65 y.o. M) Treating RN: Donnamarie Poag Primary Care Eulon Allnutt: Reeves Dam Other Clinician: Referring Ana Liaw: Reeves Dam Treating Giulietta Prokop/Extender: Yaakov Guthrie in Treatment: 7 Vital Signs Height(in): 70 Pulse(bpm): 67 Weight(lbs): 290 Blood Pressure(mmHg): 145/80 Body Mass Index(BMI): 42 Temperature(F): 98.4 Respiratory Rate(breaths/min):  18 Photos: [N/A:N/A] Wound Location: Left, Posterior Lower Leg N/A N/A Wounding Event: Gradually Appeared N/A N/A Primary Etiology: Diabetic Wound/Ulcer of the Lower N/A N/A Extremity Comorbid History: Hypertension, Type II Diabetes, N/A N/A Neuropathy Date Acquired: 04/08/2020 N/A N/A Weeks of Treatment: 7 N/A N/A Wound Status: Open N/A N/A Measurements L x W x D (cm) 3.5x5.5x0.2 N/A N/A Area (cm) : 15.119 N/A N/A Volume (cm) : 3.024 N/A N/A % Reduction in Area: -37.50% N/A N/A % Reduction in Volume: -174.90% N/A N/A Classification: Grade 2 N/A N/A  Exudate Amount: Medium N/A N/A Exudate Type: Serosanguineous N/A N/A Exudate Color: red, brown N/A N/A Granulation Amount: Small (1-33%) N/A N/A Granulation Quality: Red, Pink N/A N/A Necrotic Amount: Large (67-100%) N/A N/A Necrotic Tissue: Eschar, Adherent Slough N/A N/A Exposed Structures: Fat Layer (Subcutaneous Tissue): N/A N/A Yes Fascia: No Tendon: No Muscle: No Joint: No Bone: No Epithelialization: Small (1-33%) N/A N/A Treatment Notes Electronic Signature(s) Signed: 07/08/2020 3:58:02 PM By: Kalman Shan DO Entered By: Kalman Shan on 07/08/2020 15:36:08 Shane Bean ([id number]) -------------------------------------------------------------------------------- [place] Details Patient Name: Shane Bean Date of Service: 07/08/2020 3:00 PM Medical Record Number: [id number] Patient Account Number: [id number] Date of Birth/Sex: 09-13-1955 (65 y.o. M) Treating RN: Donnamarie Poag Primary Care Maeryn Mcgath: Reeves Dam Other Clinician: Referring Darold Miley: Reeves Dam Treating Makenley Shimp/Extender: Yaakov Guthrie in Treatment: 7 Active Inactive Wound/Skin Impairment Nursing Diagnoses: Impaired tissue integrity Knowledge deficit related to smoking impact on wound healing Knowledge deficit related to ulceration/compromised skin integrity Goals: Patient/caregiver will verbalize  understanding of skin care regimen Date Initiated: 05/18/2020 Date Inactivated: 07/08/2020 Target Resolution Date: 06/19/2020 Goal Status: Met Ulcer/skin breakdown will have a volume reduction of 30% by week 4 Date Initiated: 05/18/2020 Date Inactivated: 07/08/2020 Target Resolution Date: 06/15/2020 Goal Status: Met Ulcer/skin breakdown will have a volume reduction of 50% by week 8 Date Initiated: 05/18/2020 Target Resolution Date: 07/13/2020 Goal Status: Active Ulcer/skin breakdown will have a volume reduction of 80% by week 12 Date Initiated: 05/18/2020 Target Resolution Date: 08/10/2020 Goal Status: Active Interventions: Assess patient/caregiver ability to obtain necessary supplies Assess patient/caregiver ability to perform ulcer/skin care regimen upon admission and as needed Assess ulceration(s) every visit Notes: Electronic Signature(s) Signed: 07/08/2020 4:25:48 PM By: Donnamarie Poag Entered By: Donnamarie Poag on 07/08/2020 15:28:31 Shane Bean ([id number]) -------------------------------------------------------------------------------- Pain Assessment Details Patient Name: Shane Bean Date of Service: 07/08/2020 3:00 PM Medical Record Number: [id number] Patient Account Number: [id number] Date of Birth/Sex: 09/27/1955 (65 y.o. M) Treating RN: Donnamarie Poag Primary Care Lolly Glaus: Reeves Dam Other Clinician: Referring Breeze Berringer: Reeves Dam Treating Somtochukwu Woollard/Extender: Yaakov Guthrie in Treatment: 7 Active Problems Location of Pain Severity and Description of Pain Patient Has Paino Yes Site Locations Pain Location: Generalized Pain, Pain in Ulcers Rate the pain. Current Pain Level: 8 Pain Management and Medication Current Pain Management: Electronic Signature(s) Signed: 07/08/2020 4:25:48 PM By: Donnamarie Poag Entered By: Donnamarie Poag on 07/08/2020 15:18:55 Shane Bean  ([id number]) -------------------------------------------------------------------------------- Patient/Caregiver Education Details Patient Name: Shane Bean Date of Service: 07/08/2020 3:00 PM Medical Record Number: [id number] Patient Account Number: [id number] Date of Birth/Gender: 07-03-1955 (65 y.o. M) Treating RN: Donnamarie Poag Primary Care Physician: Reeves Dam Other Clinician: Referring Physician: Reeves Dam Treating Physician/Extender: Yaakov Guthrie in Treatment: 7 Education Assessment Education Provided To: Patient Education Topics Provided Basic Hygiene: Infection: Wound/Skin Impairment: Electronic Signature(s) Signed: 07/08/2020 4:25:48 PM By: Donnamarie Poag Entered By: Donnamarie Poag on 07/08/2020 15:36:37 Shane Bean ([id number]) -------------------------------------------------------------------------------- Wound Assessment Details Patient Name: Shane Bean Date of Service: 07/08/2020 3:00 PM Medical Record Number: [id number] Patient Account Number: [id number] Date of Birth/Sex: 02/12/1955 (65 y.o. M) Treating RN: Donnamarie Poag Primary Care Jilliann Subramanian: Reeves Dam Other Clinician: Referring Gaege Sangalang: Reeves Dam Treating Domonic Hiscox/Extender: Yaakov Guthrie in Treatment: 7 Wound Status Wound Number: 1 Primary Etiology: Diabetic Wound/Ulcer of the Lower Extremity Wound Location: Left, Posterior Lower Leg Wound Status: Open Wounding Event: Gradually Appeared Comorbid History: Hypertension, Type II Diabetes, Neuropathy Date Acquired: 04/08/2020 Weeks Of Treatment: 7 Clustered Wound: No Photos  Wound Measurements Length: (cm) 3.5 Width: (cm) 5.5 Depth: (cm) 0.2 Area: (cm) 15.119 Volume: (cm) 3.024 % Reduction in Area: -37.5% % Reduction in Volume: -174.9% Epithelialization: Small (1-33%) Tunneling: No Undermining: No Wound Description Classification: Grade 2 Exudate Amount: Medium Exudate Type: Serosanguineous Exudate Color:  red, brown Foul Odor After Cleansing: No Slough/Fibrino Yes Wound Bed Granulation Amount: Small (1-33%) Exposed Structure Granulation Quality: Red, Pink Fascia Exposed: No Necrotic Amount: Large (67-100%) Fat Layer (Subcutaneous Tissue) Exposed: Yes Necrotic Quality: Eschar, Adherent Slough Tendon Exposed: No Muscle Exposed: No Joint Exposed: No Bone Exposed: No Treatment Notes Wound #1 (Lower Leg) Wound Laterality: Left, Posterior Cleanser Soap and Water Discharge Instruction: Gently cleanse wound with antibacterial soap, rinse and pat dry prior to dressing wounds Peri-Wound Care Shane Bean, Shane Bean ([id number]) Topical Santyl Collagenase Ointment, 30 (gm), tube Discharge Instruction: Apply to wound in clinic only. Primary Dressing Hydrofera Blue Ready Transfer Foam, 2.5x2.5 (in/in) Discharge Instruction: Apply Hydrofera Blue Ready to wound bed as directed Secondary Dressing ABD Pad 5x9 (in/in) Discharge Instruction: Cover with ABD pad Secured With Coban Cohesive Bandage 4x5 (yds) Stretched Discharge Instruction: Apply Coban from base of toes to 3-finger-widths below knee Kerlix Roll Sterile or Non-Sterile 6-ply 4.5x4 (yd/yd) Discharge Instruction: Apply Kerlix from base of toes to 3-finger-widths below knee Compression Wrap Compression Stockings Add-Ons Electronic Signature(s) Signed: 07/08/2020 4:25:48 PM By: Donnamarie Poag Entered By: Donnamarie Poag on 07/08/2020 15:24:45 Shane Bean ([id number]) -------------------------------------------------------------------------------- [place] Details Patient Name: Shane Bean Date of Service: 07/08/2020 3:00 PM Medical Record Number: [id number] Patient Account Number: [id number] Date of Birth/Sex: 1955/02/07 (65 y.o. M) Treating RN: Donnamarie Poag Primary Care Edmund Holcomb: Reeves Dam Other Clinician: Referring Dashonda Bonneau: Reeves Dam Treating Jemery Stacey/Extender: Yaakov Guthrie in Treatment: 7 Vital Signs Time  Taken: 15:17 Temperature (F): 98.4 Height (in): 70 Pulse (bpm): 72 Weight (lbs): 290 Respiratory Rate (breaths/min): 18 Body Mass Index (BMI): 41.6 Blood Pressure (mmHg): 145/80 Reference Range: 80 - 120 mg / dl Electronic Signature(s) Signed: 07/08/2020 4:25:48 PM By: Donnamarie Poag Entered ByDonnamarie Poag on 07/08/2020 15:18:46

## 2020-07-08 NOTE — Progress Notes (Signed)
Shane Bean ([id number]) Visit Report for 07/08/2020 Chief Complaint Document Details Patient Name: Shane Bean, Shane Bean Date of Service: 07/08/2020 3:00 PM Medical Record Number: [id number] Patient Account Number: [id number] Date of Birth/Sex: 1955/05/07 (65 y.o. M) Treating RN: Dolan Amen Primary Care Provider: Reeves Dam Other Clinician: Referring Provider: Reeves Dam Treating Provider/Extender: Yaakov Guthrie in Treatment: 7 Information Obtained from: Patient Chief Complaint Left LE Ulcer Electronic Signature(s) Signed: 07/08/2020 3:58:02 PM By: Kalman Shan DO Entered By: Kalman Shan on 07/08/2020 15:36:17 Shane Bean ([id number]) -------------------------------------------------------------------------------- HPI Details Patient Name: Shane Bean Date of Service: 07/08/2020 3:00 PM Medical Record Number: [id number] Patient Account Number: [id number] Date of Birth/Sex: Apr 02, 1955 (65 y.o. M) Treating RN: Dolan Amen Primary Care Provider: Reeves Dam Other Clinician: Referring Provider: Reeves Dam Treating Provider/Extender: Yaakov Guthrie in Treatment: 7 History of Present Illness HPI Description: 05/18/2020 upon evaluation today patient appears for initial evaluation here in clinic concerning issues that he has been having with the wound on his left lateral leg. Fortunately there does not appear to be any signs of obvious an active infection at this time which is great news. With that being said the patient unfortunately is continued to have issues with pain although he tells me it is gotten a lot better. He was on Keflex as well as Bactrim DS which seems to have done a good job there. His most recent hemoglobin A1c was 9.8 that was on 04/14/2020. Subsequently I do feel like that the patient is making progress here. He does have a history of chronic venous insufficiency as well as hypertension. I do not see any need for  antibiotics at this point. 5/25; follow-up of the wound on the left posterior calf. This is completely necrotic on the surface. It does not look infected but it is painful. He is a diabetic but I think there is some suggestion here of chronic venous disease as well. We used Iodoflex under compression last week 6/1; again a completely necrotic surface on this with the wound. We have been using Iodoflex under compression he complains of gnawing pain. He has had wounds previously a lot of this looks like chronic venous disease. 6/8; about two thirds of the surface of this wound is still covered in a black necrotic eschar. I changed him from Iodoflex to [place] last week because of complaints of pain. He actually was seen by her neurologist this weeko Peripheral neuropathy as a cause of the pain and they changed him from Neurontin to Lyrica but he still has not had any relief. He says that most of the pain however is in his heel not in his wound per se. He is a diabetic 6/15. This is a patient with what looks to be a venous wound on the left lateral lower leg. He is also a diabetic. Currently being worked up for diabetic neuropathy. He complains of pain out of proportion to the size of the wound although to be fair the entire area here was eschared. We have been working to get a viable surface. We are using Sorbact 6/22; fairly painful wound on the left posterior calf. I am assuming this is been venous he is also a diabetic. His ABI in our clinic was noncompressible however he has easily palpable pulses on his feet. He complains of a lot of pain in the wound also of the left heel and the upper left calf. Some of this may be neuropathy. It is led me to discontinue Iodoflex reduce  his compression but he still complains of pain in fact he says he could not take a debridement today. When I first saw this it was completely necrotic surface we have got it down to something that looks a reasonably healthy I  have been wondering about biopsying this area however he is on Coumadin and with the pain I have put this off. The major question would be an inflammatory ulcer such as pyoderma. The patient is not aware of how this started. He does however have a wound history on both legs. 6/29; patient presents for 1 week follow-up. He has been using Hydrofera Blue under Kerlix/Coban. He has tolerated this well. He currently denies signs of infection. He also declines debridement today. He he states it is painful and does not want to have this done. 7/6; Patient presents for 1 week follow-up. He has been using Hydrofera Blue under Kerlix/Coban. He denies signs of infection. He declines debridement today. Electronic Signature(s) Signed: 07/08/2020 3:58:02 PM By: Kalman Shan DO Entered By: Kalman Shan on 07/08/2020 15:53:27 Shane Bean ([id number]) -------------------------------------------------------------------------------- Physical Exam Details Patient Name: Shane Bean Date of Service: 07/08/2020 3:00 PM Medical Record Number: [id number] Patient Account Number: [id number] Date of Birth/Sex: 1955/10/27 (65 y.o. M) Treating RN: Dolan Amen Primary Care Provider: Reeves Dam Other Clinician: Referring Provider: Reeves Dam Treating Provider/Extender: Yaakov Guthrie in Treatment: 7 Constitutional . Cardiovascular . Psychiatric . Notes Left lower extremity: Open wound to the posterior aspect. There is some granulation tissue and nonviable tissue present. It has an inflamed appearance. No obvious signs of infection. Electronic Signature(s) Signed: 07/08/2020 3:58:02 PM By: Kalman Shan DO Entered By: Kalman Shan on 07/08/2020 15:53:59 Shane Bean ([id number]) -------------------------------------------------------------------------------- Physician Orders Details Patient Name: Shane Bean Date of Service: 07/08/2020 3:00 PM Medical Record  Number: [id number] Patient Account Number: [id number] Date of Birth/Sex: 14-May-1955 (65 y.o. M) Treating RN: Donnamarie Poag Primary Care Provider: Reeves Dam Other Clinician: Referring Provider: Reeves Dam Treating Provider/Extender: Yaakov Guthrie in Treatment: 7 Verbal / Phone Orders: No Diagnosis Coding ICD-10 Coding Code Description E11.622 Type 2 diabetes mellitus with other skin ulcer I87.2 Venous insufficiency (chronic) (peripheral) 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 layer exposed I10 Essential (primary) hypertension Follow-up Appointments o Return Appointment in 1 week. o Nurse Visit as needed Bathing/ Shower/ Hygiene o May shower with wound dressing protected with water repellent cover or cast protector. - cover dressing during shower-do not get it wet Edema Control - Lymphedema / Segmental Compressive Device / Other o Elevate, Exercise Daily and Avoid Standing for Long Periods of Time. o Elevate legs to the level of the heart and pump ankles as often as possible o Elevate leg(s) parallel to the floor when sitting. Wound Treatment Wound #1 - Lower Leg Wound Laterality: Left, Posterior Cleanser: Soap and Water 1 x Per Week/30 Days Discharge Instructions: Gently cleanse wound with antibacterial soap, rinse and pat dry prior to dressing wounds Topical: Santyl Collagenase Ointment, 30 (gm), tube 1 x Per Week/30 Days Discharge Instructions: Apply to wound in clinic only. Primary Dressing: Hydrofera Blue Ready Transfer Foam, 2.5x2.5 (in/in) 1 x Per Week/30 Days Discharge Instructions: Apply Hydrofera Blue Ready to wound bed as directed Secondary Dressing: ABD Pad 5x9 (in/in) 1 x Per Week/30 Days Discharge Instructions: Cover with ABD pad Secured With: Coban Cohesive Bandage 4x5 (yds) Stretched 1 x Per Week/30 Days Discharge Instructions: Apply Coban from base of toes to 3-finger-widths below knee  Secured With:  [employer] or Non-Sterile 6-ply 4.5x4 (yd/yd) 1 x Per Week/30 Days Discharge Instructions: Apply Kerlix from base of toes to 3-finger-widths below knee Electronic Signature(s) Signed: 07/08/2020 3:58:02 PM By: Kalman Shan DO Signed: 07/08/2020 4:25:48 PM By: Donnamarie Poag Entered By: Donnamarie Poag on 07/08/2020 15:34:06 Shane Bean ([id number]) -------------------------------------------------------------------------------- Problem List Details Patient Name: Shane Bean Date of Service: 07/08/2020 3:00 PM Medical Record Number: [id number] Patient Account Number: [id number] Date of Birth/Sex: [birth year] (64 y.o. M) Treating RN: Dolan Amen Primary Care Provider: Reeves Dam Other Clinician: Referring Provider: Reeves Dam Treating Provider/Extender: Yaakov Guthrie in Treatment: 7 Active Problems ICD-10 Encounter Code Description Active Date MDM Diagnosis E11.622 Type 2 diabetes mellitus with other skin ulcer 05/18/2020 No Yes I87.2 Venous insufficiency (chronic) (peripheral) 05/18/2020 No Yes 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 layer 05/18/2020 No Yes exposed National (primary) hypertension 05/18/2020 No Yes Inactive Problems Resolved Problems Electronic Signature(s) Signed: 07/08/2020 3:58:02 PM By: Kalman Shan DO Entered By: Kalman Shan on 07/08/2020 15:36:03 Shane Bean ([id number]) -------------------------------------------------------------------------------- Progress Note Details Patient Name: Shane Bean Date of Service: 07/08/2020 3:00 PM Medical Record Number: [id number] Patient Account Number: [id number] Date of Birth/Sex: [birth year] (64 y.o. M) Treating RN: Dolan Amen Primary Care Provider: Reeves Dam Other Clinician: Referring Provider: Reeves Dam Treating Provider/Extender: Yaakov Guthrie in Treatment: 7 Subjective Chief Complaint Information  obtained from Patient Left LE Ulcer History of Present Illness (HPI) 05/18/2020 upon evaluation today patient appears for initial evaluation here in clinic concerning issues that he has been having with the wound on his left lateral leg. Fortunately there does not appear to be any signs of obvious an active infection at this time which is great news. With that being said the patient unfortunately is continued to have issues with pain although he tells me it is gotten a lot better. He was on Keflex as well as Bactrim DS which seems to have done a good job there. His most recent hemoglobin A1c was 9.8 that was on 04/14/2020. Subsequently I do feel like that the patient is making progress here. He does have a history of chronic venous insufficiency as well as hypertension. I do not see any need for antibiotics at this point. 5/25; follow-up of the wound on the left posterior calf. This is completely necrotic on the surface. It does not look infected but it is painful. He is a diabetic but I think there is some suggestion here of chronic venous disease as well. We used Iodoflex under compression last week 6/1; again a completely necrotic surface on this with the wound. We have been using Iodoflex under compression he complains of gnawing pain. He has had wounds previously a lot of this looks like chronic venous disease. 6/8; about two thirds of the surface of this wound is still covered in a black necrotic eschar. I changed him from Iodoflex to [place] last week because of complaints of pain. He actually was seen by her neurologist this weeko Peripheral neuropathy as a cause of the pain and they changed him from Neurontin to Lyrica but he still has not had any relief. He says that most of the pain however is in his heel not in his wound per se. He is a diabetic 6/15. This is a patient with what looks to be a venous wound on the left lateral lower leg. He is also a diabetic. Currently being worked up  for  diabetic neuropathy. He complains of pain out of proportion to the size of the wound although to be fair the entire area here was eschared. We have been working to get a viable surface. We are using Sorbact 6/22; fairly painful wound on the left posterior calf. I am assuming this is been venous he is also a diabetic. His ABI in our clinic was noncompressible however he has easily palpable pulses on his feet. He complains of a lot of pain in the wound also of the left heel and the upper left calf. Some of this may be neuropathy. It is led me to discontinue Iodoflex reduce his compression but he still complains of pain in fact he says he could not take a debridement today. When I first saw this it was completely necrotic surface we have got it down to something that looks a reasonably healthy I have been wondering about biopsying this area however he is on Coumadin and with the pain I have put this off. The major question would be an inflammatory ulcer such as pyoderma. The patient is not aware of how this started. He does however have a wound history on both legs. 6/29; patient presents for 1 week follow-up. He has been using Hydrofera Blue under Kerlix/Coban. He has tolerated this well. He currently denies signs of infection. He also declines debridement today. He he states it is painful and does not want to have this done. 7/6; Patient presents for 1 week follow-up. He has been using Hydrofera Blue under Kerlix/Coban. He denies signs of infection. He declines debridement today. Patient History Information obtained from Patient. Social History Never smoker, Marital Status - Single, Alcohol Use - Moderate, Drug Use - No History, Caffeine Use - Never. Medical History Eyes Denies history of Cataracts, Glaucoma, Optic Neuritis Ear/Nose/Mouth/Throat Denies history of Chronic sinus problems/congestion Hematologic/Lymphatic Denies history of Anemia, Hemophilia, Human Immunodeficiency Virus,  Lymphedema Respiratory Denies history of Aspiration, Asthma, Chronic Obstructive Pulmonary Disease (COPD), Pneumothorax, Sleep Apnea, Tuberculosis Cardiovascular Patient has history of Hypertension Denies history of Angina, Arrhythmia, Congestive Heart Failure, Coronary Artery Disease, Deep Vein Thrombosis, Hypotension, Myocardial Infarction, Peripheral Arterial Disease, Peripheral Venous Disease, Phlebitis, Vasculitis Gastrointestinal Denies history of Cirrhosis , Colitis, Crohn s, Hepatitis A, Hepatitis B Endocrine Patient has history of Type II Diabetes Denies history of Type I Diabetes Genitourinary YOSTIN, MALACARA ([id number]) Denies history of End Stage Renal Disease Immunological Denies history of Lupus Erythematosus, Raynaud s, Scleroderma Integumentary (Skin) Denies history of History of Burn, History of pressure wounds Musculoskeletal Denies history of Gout, Rheumatoid Arthritis, Osteoarthritis, Osteomyelitis Neurologic Patient has history of Neuropathy Denies history of Dementia, Quadriplegia, Paraplegia, Seizure Disorder Oncologic Denies history of Received Chemotherapy, Received Radiation Psychiatric Denies history of Anorexia/bulimia, Confinement Anxiety Objective Constitutional Vitals Time Taken: 3:17 PM, Height: 70 in, Weight: 290 lbs, BMI: 41.6, Temperature: 98.4 F, Pulse: 72 bpm, Respiratory Rate: 18 breaths/min, Blood Pressure: 145/80 mmHg. General Notes: Left lower extremity: Open wound to the posterior aspect. There is some granulation tissue and nonviable tissue present. It has an inflamed appearance. No obvious signs of infection. Integumentary (Hair, Skin) Wound #1 status is Open. Original cause of wound was Gradually Appeared. The date acquired was: 04/08/2020. The wound has been in treatment 7 weeks. The wound is located on the Left,Posterior Lower Leg. The wound measures 3.5cm length x 5.5cm width x 0.2cm depth; 15.119cm^2 area and 3.024cm^3 volume.  There is Fat Layer (Subcutaneous Tissue) exposed. There is no tunneling or undermining noted. There is a  medium amount of serosanguineous drainage noted. There is small (1-33%) red, pink granulation within the wound bed. There is a large (67-100%) amount of necrotic tissue within the wound bed including Eschar and Adherent Slough. Assessment Active Problems ICD-10 Type 2 diabetes mellitus with other skin ulcer Venous insufficiency (chronic) (peripheral) Non-pressure chronic ulcer of other part of left lower leg with fat layer exposed Essential (primary) hypertension Patient's wound is stable. He declines in office sharp debridement today. I recommended continuing Santyl with Hydrofera Blue and this should offer some debridement to help. No signs of infection. Plan Follow-up Appointments: Return Appointment in 1 week. Nurse Visit as needed Bathing/ Shower/ Hygiene: May shower with wound dressing protected with water repellent cover or cast protector. - cover dressing during shower-do not get it wet Edema Control - Lymphedema / Segmental Compressive Device / Other: Elevate, Exercise Daily and Avoid Standing for Long Periods of Time. Elevate legs to the level of the heart and pump ankles as often as possible Zuluaga, Jamerius V. ([id number]) Elevate leg(s) parallel to the floor when sitting. WOUND #1: - Lower Leg Wound Laterality: Left, Posterior Cleanser: Soap and Water 1 x Per Week/30 Days Discharge Instructions: Gently cleanse wound with antibacterial soap, rinse and pat dry prior to dressing wounds Topical: Santyl Collagenase Ointment, 30 (gm), tube 1 x Per Week/30 Days Discharge Instructions: Apply to wound in clinic only. Primary Dressing: Hydrofera Blue Ready Transfer Foam, 2.5x2.5 (in/in) 1 x Per Week/30 Days Discharge Instructions: Apply Hydrofera Blue Ready to wound bed as directed Secondary Dressing: ABD Pad 5x9 (in/in) 1 x Per Week/30 Days Discharge Instructions: Cover with ABD  pad Secured With: Coban Cohesive Bandage 4x5 (yds) Stretched 1 x Per Week/30 Days Discharge Instructions: Apply Coban from base of toes to 3-finger-widths below knee Secured With: Kerlix Roll Sterile or Non-Sterile 6-ply 4.5x4 (yd/yd) 1 x Per Week/30 Days Discharge Instructions: Apply Kerlix from base of toes to 3-finger-widths below knee 1. Santyl, Hydrofera Blue under Kerlix/Coban 2. Follow-up in 1 week Electronic Signature(s) Signed: 07/08/2020 3:58:02 PM By: Kalman Shan DO Entered By: Kalman Shan on 07/08/2020 15:57:07 Shane Bean ([id number]) -------------------------------------------------------------------------------- ROS/PFSH Details Patient Name: Shane Bean Date of Service: 07/08/2020 3:00 PM Medical Record Number: [id number] Patient Account Number: [id number] Date of Birth/Sex: 30-Jun-1955 (64 y.o. M) Treating RN: Dolan Amen Primary Care Provider: Reeves Dam Other Clinician: Referring Provider: Reeves Dam Treating Provider/Extender: Yaakov Guthrie in Treatment: 7 Information Obtained From Patient Eyes Medical History: Negative for: Cataracts; Glaucoma; Optic Neuritis Ear/Nose/Mouth/Throat Medical History: Negative for: Chronic sinus problems/congestion Hematologic/Lymphatic Medical History: Negative for: Anemia; Hemophilia; Human Immunodeficiency Virus; Lymphedema Respiratory Medical History: Negative for: Aspiration; Asthma; Chronic Obstructive Pulmonary Disease (COPD); Pneumothorax; Sleep Apnea; Tuberculosis Cardiovascular Medical History: Positive for: Hypertension Negative for: Angina; Arrhythmia; Congestive Heart Failure; Coronary Artery Disease; Deep Vein Thrombosis; Hypotension; Myocardial Infarction; Peripheral Arterial Disease; Peripheral Venous Disease; Phlebitis; Vasculitis Gastrointestinal Medical History: Negative for: Cirrhosis ; Colitis; Crohnos; Hepatitis A; Hepatitis B Endocrine Medical History: Positive  for: Type II Diabetes Negative for: Type I Diabetes Time with diabetes: 3 Treated with: Oral agents Blood sugar tested every day: No Genitourinary Medical History: Negative for: End Stage Renal Disease Immunological Medical History: Negative for: Lupus Erythematosus; Raynaudos; Scleroderma Integumentary (Skin) Medical History: Negative for: History of Burn; History of pressure wounds Winkels, Swain V. ([id number]) Musculoskeletal Medical History: Negative for: Gout; Rheumatoid Arthritis; Osteoarthritis; Osteomyelitis Neurologic Medical History: Positive for: Neuropathy Negative for: Dementia; Quadriplegia; Paraplegia; Seizure Disorder Oncologic Medical History: Negative for: Received Chemotherapy;  Received Radiation Psychiatric Medical History: Negative for: Anorexia/bulimia; Confinement Anxiety Immunizations Pneumococcal Vaccine: Received Pneumococcal Vaccination: Yes Implantable Devices None Family and Social History Never smoker; Marital Status - Single; Alcohol Use: Moderate; Drug Use: No History; Caffeine Use: Never; Financial Concerns: No; Food, Clothing or Shelter Needs: No; Support System Lacking: No; Transportation Concerns: No Electronic Signature(s) Signed: 07/08/2020 3:58:02 PM By: Kalman Shan DO Signed: 07/08/2020 5:07:08 PM By: Dolan Amen RN Entered By: Kalman Shan on 07/08/2020 15:53:36 Shane Bean ([id number]) -------------------------------------------------------------------------------- Calcium Details Patient Name: Shane Bean Date of Service: 07/08/2020 Medical Record Number: [id number] Patient Account Number: [id number] Date of Birth/Sex: 1955/03/04 (64 y.o. M) Treating RN: Donnamarie Poag Primary Care Provider: Reeves Dam Other Clinician: Referring Provider: Reeves Dam Treating Provider/Extender: Yaakov Guthrie in Treatment: 7 Diagnosis Coding ICD-10 Codes Code Description E11.622 Type 2 diabetes mellitus  with other skin ulcer I87.2 Venous insufficiency (chronic) (peripheral) 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 layer exposed I10 Essential (primary) hypertension Facility Procedures CPT4 Code: [id number] Description: 99213 - WOUND CARE VISIT-LEV 3 EST PT Modifier: Quantity: 1 Physician Procedures CPT4 Code: 9774142 Description: 39532 - WC PHYS LEVEL 3 - EST PT Modifier: Quantity: 1 CPT4 Code: Description: ICD-10 Diagnosis Description 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 lay E11.622 Type 2 diabetes mellitus with other skin ulcer I87.2 Venous insufficiency (chronic) (peripheral) I10 Essential (primary)  hypertension Modifier: er exposed Quantity: Electronic Signature(s) Signed: 07/08/2020 3:58:02 PM By: Kalman Shan DO Entered By: Kalman Shan on 07/08/2020 15:57:29

## 2020-07-15 ENCOUNTER — Other Ambulatory Visit: Payer: Self-pay

## 2020-07-15 ENCOUNTER — Encounter (HOSPITAL_BASED_OUTPATIENT_CLINIC_OR_DEPARTMENT_OTHER): Payer: Medicare (Managed Care) | Admitting: Internal Medicine

## 2020-07-15 DIAGNOSIS — L97822 Non-pressure chronic ulcer of other part of left lower leg with fat layer exposed: Secondary | ICD-10-CM | POA: Diagnosis not present

## 2020-07-15 DIAGNOSIS — E11622 Type 2 diabetes mellitus with other skin ulcer: Secondary | ICD-10-CM | POA: Diagnosis not present

## 2020-07-16 NOTE — Progress Notes (Addendum)
DAVIED, NOCITO ([id number]) Visit Report for 07/15/2020 Arrival Information Details Patient Name: Shane Bean, Shane Bean Date of Service: 07/15/2020 3:00 PM Medical Record Number: [id number] Patient Account Number: [id number] Date of Birth/Sex: October 05, 1955 (64 y.o. M) Treating RN: Dolan Amen Primary Care Shaquila Sigman: Reeves Dam Other Clinician: Referring Keagan Brislin: Reeves Dam Treating Demarie Hyneman/Extender: Yaakov Guthrie in Treatment: 8 Visit Information History Since Last Visit Has Compression in Place as Prescribed: Yes Patient Arrived: Ambulatory Pain Present Now: Yes Arrival Time: 15:22 Accompanied By: self Transfer Assistance: None Patient Identification Verified: Yes Secondary Verification Process Completed: Yes Patient Requires Transmission-Based No Precautions: Patient Has Alerts: Yes Patient Alerts: Patient on Blood Thinner DIABETIC Electronic Signature(s) Signed: 07/15/2020 4:41:30 PM By: Dolan Amen RN Entered By: Dolan Amen on 07/15/2020 15:22:29 Shane Bean ([id number]) -------------------------------------------------------------------------------- Clinic Level of Care Assessment Details Patient Name: Shane Bean Date of Service: 07/15/2020 3:00 PM Medical Record Number: [id number] Patient Account Number: [id number] Date of Birth/Sex: 1955/06/04 (64 y.o. M) Treating RN: Donnamarie Poag Primary Care Rosary Filosa: Reeves Dam Other Clinician: Referring Brianne Maina: Reeves Dam Treating Vance Hochmuth/Extender: Yaakov Guthrie in Treatment: 8 Clinic Level of Care Assessment Items TOOL 1 Quantity Score _0  - Use when EandM and Procedure is performed on INITIAL visit 0 ASSESSMENTS - Nursing Assessment / Reassessment _1  - General Physical Exam (combine w/ comprehensive assessment (listed just below) when performed on new 0 pt. evals) _2  - 0 Comprehensive Assessment (HX, ROS, Risk Assessments, Wounds Hx, etc.) ASSESSMENTS - Wound and Skin  Assessment / Reassessment _3  - Dermatologic / Skin Assessment (not related to wound area) 0 ASSESSMENTS - Ostomy and/or Continence Assessment and Care _4  - Incontinence Assessment and Management 0 _5  - 0 Ostomy Care Assessment and Management (repouching, etc.) PROCESS - Coordination of Care _6  - Simple Patient / Family Education for ongoing care 0 _7  - 0 Complex (extensive) Patient / Family Education for ongoing care _8  - 0 Staff obtains Programmer, systems, Records, Test Results / Process Orders _9  - 0 Staff telephones HHA, Nursing Homes / Clarify orders / etc _10  - 0 Routine Transfer to another Facility (non-emergent condition) _11  - 0 Routine Hospital Admission (non-emergent condition) _12  - 0 New Admissions / Biomedical engineer / Ordering NPWT, Apligraf, etc. _13  - 0 Emergency Hospital Admission (emergent condition) PROCESS - Special Needs _14  - Pediatric / Minor Patient Management 0 _15  - 0 Isolation Patient Management _16  - 0 Hearing / Language / Visual special needs _17  - 0 Assessment of Community assistance (transportation, D/C planning, etc.) _18  - 0 Additional assistance / Altered mentation _19  - 0 Support Surface(s) Assessment (bed, cushion, seat, etc.) INTERVENTIONS - Miscellaneous _20  - External ear exam 0 _21  - 0 Patient Transfer (multiple staff / Civil Service fast streamer / Similar devices) _22  - 0 Simple Staple / Suture removal (25 or less) _23  - 0 Complex Staple / Suture removal (26 or more) _24  - 0 Hypo/Hyperglycemic Management (do not check if billed separately) _25  - 0 Ankle / Brachial Index (ABI) - do not check if billed separately Has the patient been seen at the hospital within the last three years: Yes Total Score: 0 Level Of Care: ____ Shane Bean ([id number]) Electronic Signature(s) Signed: 07/16/2020 4:28:40 PM By: Donnamarie Poag Entered By: Donnamarie Poag on 07/15/2020 15:58:23 Shane Bean  ([id number]) -------------------------------------------------------------------------------- Encounter Discharge Information Details Patient Name: Shane Bean Date of Service: 07/15/2020 3:00 PM Medical Record Number: [id number] Patient Account Number: [id number] Date of Birth/Sex: [birth year] (64 y.o. M) Treating RN: Donnamarie Poag Primary  Care Alekzander Cardell: Reeves Dam Other Clinician: Referring Joangel Vanosdol: Reeves Dam Treating Kanetra Ho/Extender: Yaakov Guthrie in Treatment: 8 Encounter Discharge Information Items Post Procedure Vitals Discharge Condition: Stable Temperature (F): 99.5 Ambulatory Status: Ambulatory Pulse (bpm): 81 Discharge Destination: Home Respiratory Rate (breaths/min): 18 Transportation: Private Auto Blood Pressure (mmHg): 149/75 Accompanied By: self Schedule Follow-up Appointment: Yes Clinical Summary of Care: Electronic Signature(s) Signed: 07/16/2020 4:28:40 PM By: Donnamarie Poag Entered By: Donnamarie Poag on 07/15/2020 16:08:59 Shane Bean ([id number]) -------------------------------------------------------------------------------- Lower Extremity Assessment Details Patient Name: Shane Bean Date of Service: 07/15/2020 3:00 PM Medical Record Number: [id number] Patient Account Number: [id number] Date of Birth/Sex: [birth year] (64 y.o. M) Treating RN: Dolan Amen Primary Care Lulie Hurd: Reeves Dam Other Clinician: Referring Mirko Tailor: Reeves Dam Treating Desirae Mancusi/Extender: Yaakov Guthrie in Treatment: 8 Edema Assessment Assessed: [Left: Yes] [Right: No] Edema: [Left: Ye] [Right: s] Calf Left: Right: Point of Measurement: 34 cm From Medial Instep 43.5 cm Ankle Left: Right: Point of Measurement: 10 cm From Medial Instep 24.5 cm Vascular Assessment Pulses: Dorsalis Pedis Palpable: [Left:Yes] Electronic Signature(s) Signed: 07/15/2020 4:41:30 PM By: Dolan Amen RN Entered By: Dolan Amen on 07/15/2020  15:31:29 Shane Bean ([id number]) -------------------------------------------------------------------------------- Multi Wound Chart Details Patient Name: Shane Bean Date of Service: 07/15/2020 3:00 PM Medical Record Number: [id number] Patient Account Number: [id number] Date of Birth/Sex: May 24, 1955 (64 y.o. M) Treating RN: Donnamarie Poag Primary Care Talayia Hjort: Reeves Dam Other Clinician: Referring Sumeet Geter: Reeves Dam Treating Shelaine Frie/Extender: Yaakov Guthrie in Treatment: 8 Vital Signs Height(in): 70 Pulse(bpm): 37 Weight(lbs): 290 Blood Pressure(mmHg): 149/75 Body Mass Index(BMI): 42 Temperature(F): 99.4 Respiratory Rate(breaths/min): 18 Photos: [N/A:N/A] Wound Location: Left, Posterior Lower Leg N/A N/A Wounding Event: Gradually Appeared N/A N/A Primary Etiology: Diabetic Wound/Ulcer of the Lower N/A N/A Extremity Comorbid History: Hypertension, Type II Diabetes, N/A N/A Neuropathy Date Acquired: 04/08/2020 N/A N/A Weeks of Treatment: 8 N/A N/A Wound Status: Open N/A N/A Measurements L x W x D (cm) 6x8x0.2 N/A N/A Area (cm) : 37.699 N/A N/A Volume (cm) : 7.54 N/A N/A % Reduction in Area: -242.80% N/A N/A % Reduction in Volume: -585.50% N/A N/A Classification: Grade 2 N/A N/A Exudate Amount: Large N/A N/A Exudate Type: Serosanguineous N/A N/A Exudate Color: red, brown N/A N/A Granulation Amount: Small (1-33%) N/A N/A Granulation Quality: Red, Pink N/A N/A Necrotic Amount: Large (67-100%) N/A N/A Necrotic Tissue: Eschar, Adherent Slough N/A N/A Exposed Structures: Fat Layer (Subcutaneous Tissue): N/A N/A Yes Fascia: No Tendon: No Muscle: No Joint: No Bone: No Epithelialization: Small (1-33%) N/A N/A Debridement: Debridement - Excisional N/A N/A Pre-procedure Verification/Time 15:54 N/A N/A Out Taken: Tissue Debrided: Subcutaneous, Slough N/A N/A Level: Skin/Subcutaneous Tissue N/A N/A Debridement Area (sq cm): 12 N/A  N/A Instrument: Curette, Forceps, Scissors N/A N/A Bleeding: Minimum N/A N/A Hemostasis Achieved: Pressure N/A N/A Debridement Treatment Procedure was tolerated well N/A N/A Response: Post Debridement 6x8x0.2 N/A N/A Measurements L x W x D (cm) Shane Bean, Shane Bean ([id number]) Post Debridement Volume: 7.54 N/A N/A (cm) Procedures Performed: Debridement N/A N/A Treatment Notes Wound #1 (Lower Leg) Wound Laterality: Left, Posterior Cleanser Soap and Water Discharge Instruction: Gently cleanse wound with antibacterial soap, rinse and pat dry prior to dressing wounds Peri-Wound Care Topical Santyl Collagenase Ointment, 30 (gm), tube Discharge Instruction: Apply to wound in clinic only. Primary Dressing Hydrofera Blue Ready Transfer Foam, 2.5x2.5 (in/in) Discharge Instruction: Apply Hydrofera Blue Ready to wound bed as directed Secondary Dressing ABD Pad 5x9 (in/in) Discharge Instruction: Cover with ABD pad Secured  With Coban Cohesive Bandage 4x5 (yds) Stretched Discharge Instruction: Apply Coban from base of toes to 3-finger-widths below knee Kerlix Roll Sterile or Non-Sterile 6-ply 4.5x4 (yd/yd) Discharge Instruction: Apply Kerlix from base of toes to 3-finger-widths below knee Compression Wrap Compression Stockings Add-Ons Electronic Signature(s) Signed: 07/17/2020 3:02:53 PM By: Kalman Shan DO Previous Signature: 07/16/2020 4:28:40 PM Version By: Donnamarie Poag Entered By: Kalman Shan on 07/17/2020 14:57:48 Shane Bean ([id number]) -------------------------------------------------------------------------------- Multi-Disciplinary Care Plan Details Patient Name: Shane Bean Date of Service: 07/15/2020 3:00 PM Medical Record Number: [id number] Patient Account Number: [id number] Date of Birth/Sex: 10/20/1955 (64 y.o. M) Treating RN: Donnamarie Poag Primary Care Anaaya Fuster: Reeves Dam Other Clinician: Referring Luverne Farone: Reeves Dam Treating  Daianna Vasques/Extender: Yaakov Guthrie in Treatment: 8 Active Inactive Wound/Skin Impairment Nursing Diagnoses: Impaired tissue integrity Knowledge deficit related to smoking impact on wound healing Knowledge deficit related to ulceration/compromised skin integrity Goals: Patient/caregiver will verbalize understanding of skin care regimen Date Initiated: 05/18/2020 Date Inactivated: 07/08/2020 Target Resolution Date: 06/19/2020 Goal Status: Met Ulcer/skin breakdown will have a volume reduction of 30% by week 4 Date Initiated: 05/18/2020 Date Inactivated: 07/08/2020 Target Resolution Date: 06/15/2020 Goal Status: Met Ulcer/skin breakdown will have a volume reduction of 50% by week 8 Date Initiated: 05/18/2020 Date Inactivated: 07/15/2020 Target Resolution Date: 07/13/2020 Goal Status: Unmet Unmet Reason: con't tx Ulcer/skin breakdown will have a volume reduction of 80% by week 12 Date Initiated: 05/18/2020 Target Resolution Date: 08/10/2020 Goal Status: Active Interventions: Assess patient/caregiver ability to obtain necessary supplies Assess patient/caregiver ability to perform ulcer/skin care regimen upon admission and as needed Assess ulceration(s) every visit Notes: Electronic Signature(s) Signed: 07/16/2020 4:28:40 PM By: Donnamarie Poag Entered By: Donnamarie Poag on 07/15/2020 15:50:46 Shane Bean ([id number]) -------------------------------------------------------------------------------- Pain Assessment Details Patient Name: Shane Bean Date of Service: 07/15/2020 3:00 PM Medical Record Number: [id number] Patient Account Number: [id number] Date of Birth/Sex: June 19, 1955 (64 y.o. M) Treating RN: Dolan Amen Primary Care Carr Shartzer: Reeves Dam Other Clinician: Referring Demetrious Rainford: Reeves Dam Treating Kimmy Parish/Extender: Yaakov Guthrie in Treatment: 8 Active Problems Location of Pain Severity and Description of Pain Patient Has Paino Yes Site  Locations Pain Location: Generalized Pain Duration of the Pain. Constant / Intermittento Constant Pain Management and Medication Current Pain Management: Electronic Signature(s) Signed: 07/15/2020 4:41:30 PM By: Dolan Amen RN Entered By: Dolan Amen on 07/15/2020 15:23:18 Shane Bean ([id number]) -------------------------------------------------------------------------------- Patient/Caregiver Education Details Patient Name: Shane Bean Date of Service: 07/15/2020 3:00 PM Medical Record Number: [id number] Patient Account Number: [id number] Date of Birth/Gender: [birth year] (64 y.o. M) Treating RN: Donnamarie Poag Primary Care Physician: Reeves Dam Other Clinician: Referring Physician: Reeves Dam Treating Physician/Extender: Yaakov Guthrie in Treatment: 8 Education Assessment Education Provided To: Patient Education Topics Provided Infection: Wound/Skin Impairment: Electronic Signature(s) Signed: 07/16/2020 4:28:40 PM By: Donnamarie Poag Entered By: Donnamarie Poag on 07/15/2020 15:58:45 Shane Bean ([id number]) -------------------------------------------------------------------------------- Wound Assessment Details Patient Name: Shane Bean Date of Service: 07/15/2020 3:00 PM Medical Record Number: [id number] Patient Account Number: [id number] Date of Birth/Sex: 04-15-1955 (64 y.o. M) Treating RN: Dolan Amen Primary Care Holy Battenfield: Reeves Dam Other Clinician: Referring Damante Spragg: Reeves Dam Treating Mathew Postiglione/Extender: Yaakov Guthrie in Treatment: 8 Wound Status Wound Number: 1 Primary Etiology: Diabetic Wound/Ulcer of the Lower Extremity Wound Location: Left, Posterior Lower Leg Wound Status: Open Wounding Event: Gradually Appeared Comorbid History: Hypertension, Type II Diabetes, Neuropathy Date Acquired: 04/08/2020 Weeks Of Treatment: 8 Clustered Wound: No Photos Wound Measurements Length: (cm) 6 Width: (cm)  8 Depth: (cm) 0.2 Area: (cm) 37.699 Volume: (cm) 7.54 % Reduction in Area: -242.8% % Reduction in Volume: -585.5% Epithelialization: Small (1-33%) Tunneling: No Undermining: No Wound Description Classification: Grade 2 Exudate Amount: Large Exudate Type: Serosanguineous Exudate Color: red, brown Foul Odor After Cleansing: No Slough/Fibrino Yes Wound Bed Granulation Amount: Small (1-33%) Exposed Structure Granulation Quality: Red, Pink Fascia Exposed: No Necrotic Amount: Large (67-100%) Fat Layer (Subcutaneous Tissue) Exposed: Yes Necrotic Quality: Eschar, Adherent Slough Tendon Exposed: No Muscle Exposed: No Joint Exposed: No Bone Exposed: No Treatment Notes Wound #1 (Lower Leg) Wound Laterality: Left, Posterior Cleanser Soap and Water Discharge Instruction: Gently cleanse wound with antibacterial soap, rinse and pat dry prior to dressing wounds Peri-Wound Care Shane Bean, Shane Bean ([id number]) Topical Santyl Collagenase Ointment, 30 (gm), tube Discharge Instruction: Apply to wound in clinic only. Primary Dressing Hydrofera Blue Ready Transfer Foam, 2.5x2.5 (in/in) Discharge Instruction: Apply Hydrofera Blue Ready to wound bed as directed Secondary Dressing ABD Pad 5x9 (in/in) Discharge Instruction: Cover with ABD pad Secured With Coban Cohesive Bandage 4x5 (yds) Stretched Discharge Instruction: Apply Coban from base of toes to 3-finger-widths below knee Kerlix Roll Sterile or Non-Sterile 6-ply 4.5x4 (yd/yd) Discharge Instruction: Apply Kerlix from base of toes to 3-finger-widths below knee Compression Wrap Compression Stockings Add-Ons Electronic Signature(s) Signed: 07/15/2020 4:41:30 PM By: Dolan Amen RN Entered By: Dolan Amen on 07/15/2020 15:30:35 Shane Bean ([id number]) -------------------------------------------------------------------------------- [place]le Details Patient Name: Shane Bean Date of Service: 07/15/2020 3:00  PM Medical Record Number: [id number] Patient Account Number: [id number] Date of Birth/Sex: 1955/06/10 (64 y.o. M) Treating RN: Dolan Amen Primary Care Detra Bores: Reeves Dam Other Clinician: Referring Dayan Kreis: Reeves Dam Treating Alexandra Lipps/Extender: Yaakov Guthrie in Treatment: 8 Vital Signs Time Taken: 15:22 Temperature (F): 99.4 Height (in): 70 Pulse (bpm): 81 Weight (lbs): 290 Respiratory Rate (breaths/min): 18 Body Mass Index (BMI): 41.6 Blood Pressure (mmHg): 149/75 Reference Range: 80 - 120 mg / dl Electronic Signature(s) Signed: 07/15/2020 4:41:30 PM By: Dolan Amen RN Entered By: Dolan Amen on 07/15/2020 15:22:44

## 2020-07-20 NOTE — Progress Notes (Signed)
CHAN, SHEAHAN ([id number]) Visit Report for 07/01/2020 Chief Complaint Document Details Patient Name: Shane Bean, Shane Bean Date of Service: 07/01/2020 1:00 PM Medical Record Number: [id number] Patient Account Number: [id number] Date of Birth/Sex: [birth year] (64 y.o. M) Treating RN: Cornell Barman Primary Care Provider: Reeves Dam Other Clinician: Referring Provider: Reeves Dam Treating Provider/Extender: Yaakov Guthrie in Treatment: 6 Information Obtained from: Patient Chief Complaint Left LE Ulcer Electronic Signature(s) Signed: 07/01/2020 3:43:55 PM By: Kalman Shan DO Entered By: Kalman Shan on 07/01/2020 15:38:58 Shane Bean ([id number]) -------------------------------------------------------------------------------- Debridement Details Patient Name: Shane Bean Date of Service: 07/01/2020 1:00 PM Medical Record Number: [id number] Patient Account Number: [id number] Date of Birth/Sex: 1955/04/13 (64 y.o. M) Treating RN: Cornell Barman Primary Care Provider: Reeves Dam Other Clinician: Referring Provider: Reeves Dam Treating Provider/Extender: Yaakov Guthrie in Treatment: 6 Debridement Performed for Wound #1 Left,Posterior Lower Leg Assessment: Performed By: Physician Kalman Shan, MD Debridement Type: Chemical/Enzymatic/Mechanical Agent Used: Santyl Severity of Tissue Pre Debridement: Fat layer exposed Level of Consciousness (Pre- Awake and Alert procedure): Pre-procedure Verification/Time Out Yes - 14:10 Taken: Instrument: Other : tongue blade Bleeding: None Hemostasis Achieved: Pressure Response to Treatment: Procedure was tolerated well Level of Consciousness (Post- Awake and Alert procedure): Post Debridement Measurements of Total Wound Length: (cm) 3.5 Width: (cm) 5.5 Depth: (cm) 0.2 Volume: (cm) 3.024 Character of Wound/Ulcer Post Debridement: Stable Severity of Tissue Post Debridement: Fat layer  exposed Post Procedure Diagnosis Same as Pre-procedure Electronic Signature(s) Signed: 07/01/2020 3:43:55 PM By: Kalman Shan DO Signed: 07/20/2020 1:36:15 PM By: Gretta Cool, BSN, RN, CWS, Kim RN, BSN Entered By: Gretta Cool, BSN, RN, CWS, Kim on 07/01/2020 14:11:14 Shane Bean ([id number]) -------------------------------------------------------------------------------- HPI Details Patient Name: Shane Bean Date of Service: 07/01/2020 1:00 PM Medical Record Number: [id number] Patient Account Number: [id number] Date of Birth/Sex: [birth year] (64 y.o. M) Treating RN: Cornell Barman Primary Care Provider: Reeves Dam Other Clinician: Referring Provider: Reeves Dam Treating Provider/Extender: Yaakov Guthrie in Treatment: 6 History of Present Illness HPI Description: 05/18/2020 upon evaluation today patient appears for initial evaluation here in clinic concerning issues that he has been having with the wound on his left lateral leg. Fortunately there does not appear to be any signs of obvious an active infection at this time which is great news. With that being said the patient unfortunately is continued to have issues with pain although he tells me it is gotten a lot better. He was on Keflex as well as Bactrim DS which seems to have done a good job there. His most recent hemoglobin A1c was 9.8 that was on 04/14/2020. Subsequently I do feel like that the patient is making progress here. He does have a history of chronic venous insufficiency as well as hypertension. I do not see any need for antibiotics at this point. 5/25; follow-up of the wound on the left posterior calf. This is completely necrotic on the surface. It does not look infected but it is painful. He is a diabetic but I think there is some suggestion here of chronic venous disease as well. We used Iodoflex under compression last week 6/1; again a completely necrotic surface on this with the wound. We have been using  Iodoflex under compression he complains of gnawing pain. He has had wounds previously a lot of this looks like chronic venous disease. 6/8; about two thirds of the surface of this wound is still covered in a black necrotic eschar. I changed him from Iodoflex to Sorbact last week because of  complaints of pain. He actually was seen by her neurologist this weeko Peripheral neuropathy as a cause of the pain and they changed him from Neurontin to Lyrica but he still has not had any relief. He says that most of the pain however is in his heel not in his wound per se. He is a diabetic 6/15. This is a patient with what looks to be a venous wound on the left lateral lower leg. He is also a diabetic. Currently being worked up for diabetic neuropathy. He complains of pain out of proportion to the size of the wound although to be fair the entire area here was eschared. We have been working to get a viable surface. We are using Sorbact 6/22; fairly painful wound on the left posterior calf. I am assuming this is been venous he is also a diabetic. His ABI in our clinic was noncompressible however he has easily palpable pulses on his feet. He complains of a lot of pain in the wound also of the left heel and the upper left calf. Some of this may be neuropathy. It is led me to discontinue Iodoflex reduce his compression but he still complains of pain in fact he says he could not take a debridement today. When I first saw this it was completely necrotic surface we have got it down to something that looks a reasonably healthy I have been wondering about biopsying this area however he is on Coumadin and with the pain I have put this off. The major question would be an inflammatory ulcer such as pyoderma. The patient is not aware of how this started. He does however have a wound history on both legs. 6/29; patient presents for 1 week follow-up. He has been using Hydrofera Blue under Kerlix/Coban. He has tolerated this  well. He currently denies signs of infection. He also declines debridement today. He he states it is painful and does not want to have this done. Electronic Signature(s) Signed: 07/01/2020 3:43:55 PM By: Kalman Shan DO Entered By: Kalman Shan on 07/01/2020 15:39:10 Shane Bean ([id number]) -------------------------------------------------------------------------------- Physical Exam Details Patient Name: Shane Bean Date of Service: 07/01/2020 1:00 PM Medical Record Number: [id number] Patient Account Number: [id number] Date of Birth/Sex: Jul 04, 1955 (64 y.o. M) Treating RN: Cornell Barman Primary Care Provider: Reeves Dam Other Clinician: Referring Provider: Reeves Dam Treating Provider/Extender: Yaakov Guthrie in Treatment: 6 Constitutional . Cardiovascular . Psychiatric . Notes Left lower extremity: Open wound to the posterior aspect. There is some granulation tissue and nonviable tissue present. It has an inflamed appearance. No obvious signs of infection. Electronic Signature(s) Signed: 07/01/2020 3:43:55 PM By: Kalman Shan DO Entered By: Kalman Shan on 07/01/2020 15:40:05 Shane Bean ([id number]) -------------------------------------------------------------------------------- Physician Orders Details Patient Name: Shane Bean Date of Service: 07/01/2020 1:00 PM Medical Record Number: [id number] Patient Account Number: [id number] Date of Birth/Sex: 07-Nov-1955 (64 y.o. M) Treating RN: Cornell Barman Primary Care Provider: Reeves Dam Other Clinician: Referring Provider: Reeves Dam Treating Provider/Extender: Yaakov Guthrie in Treatment: 6 Verbal / Phone Orders: No Diagnosis Coding Follow-up Appointments o Return Appointment in 1 week. o Nurse Visit as needed Bathing/ Shower/ Hygiene o May shower with wound dressing protected with water repellent cover or cast protector. - cover dressing during  shower-do not get it wet Edema Control - Lymphedema / Segmental Compressive Device / Other o Elevate, Exercise Daily and Avoid Standing for Long Periods of Time. o Elevate legs to the level of the heart and pump ankles as  often as possible o Elevate leg(s) parallel to the floor when sitting. Wound Treatment Wound #1 - Lower Leg Wound Laterality: Left, Posterior Cleanser: Soap and Water 1 x Per Week/30 Days Discharge Instructions: Gently cleanse wound with antibacterial soap, rinse and pat dry prior to dressing wounds Topical: Santyl Collagenase Ointment, 30 (gm), tube 1 x Per Week/30 Days Discharge Instructions: Apply to wound in clinic only. Primary Dressing: Hydrofera Blue Ready Transfer Foam, 2.5x2.5 (in/in) 1 x Per Week/30 Days Discharge Instructions: Apply Hydrofera Blue Ready to wound bed as directed Secondary Dressing: ABD Pad 5x9 (in/in) 1 x Per Week/30 Days Discharge Instructions: Cover with ABD pad Secured With: Coban Cohesive Bandage 4x5 (yds) Stretched 1 x Per Week/30 Days Discharge Instructions: Apply Coban from base of toes to 3-finger-widths below knee Secured With: Kerlix Roll Sterile or Non-Sterile 6-ply 4.5x4 (yd/yd) 1 x Per Week/30 Days Discharge Instructions: Apply Kerlix from base of toes to 3-finger-widths below knee Electronic Signature(s) Signed: 07/01/2020 3:43:55 PM By: Kalman Shan DO Signed: 07/20/2020 1:36:15 PM By: Gretta Cool, BSN, RN, CWS, Kim RN, BSN Entered By: Gretta Cool, BSN, RN, CWS, Kim on 07/01/2020 14:09:19 Shane Bean ([id number]) -------------------------------------------------------------------------------- Problem List Details Patient Name: Shane Bean Date of Service: 07/01/2020 1:00 PM Medical Record Number: [id number] Patient Account Number: [id number] Date of Birth/Sex: [birth year] (64 y.o. M) Treating RN: Cornell Barman Primary Care Provider: Reeves Dam Other Clinician: Referring Provider: Reeves Dam Treating  Provider/Extender: Yaakov Guthrie in Treatment: 6 Active Problems ICD-10 Encounter Code Description Active Date MDM Diagnosis E11.622 Type 2 diabetes mellitus with other skin ulcer 05/18/2020 No Yes I87.2 Venous insufficiency (chronic) (peripheral) 05/18/2020 No Yes 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 layer 05/18/2020 No Yes exposed [place] (primary) hypertension 05/18/2020 No Yes Inactive Problems Resolved Problems Electronic Signature(s) Signed: 07/01/2020 3:43:55 PM By: Kalman Shan DO Entered By: Kalman Shan on 07/01/2020 15:38:38 Shane Bean ([id number]) -------------------------------------------------------------------------------- Progress Note Details Patient Name: Shane Bean Date of Service: 07/01/2020 1:00 PM Medical Record Number: [id number] Patient Account Number: [id number] Date of Birth/Sex: [birth year] (64 y.o. M) Treating RN: Cornell Barman Primary Care Provider: Reeves Dam Other Clinician: Referring Provider: Reeves Dam Treating Provider/Extender: Yaakov Guthrie in Treatment: 6 Subjective Chief Complaint Information obtained from Patient Left LE Ulcer History of Present Illness (HPI) 05/18/2020 upon evaluation today patient appears for initial evaluation here in clinic concerning issues that he has been having with the wound on his left lateral leg. Fortunately there does not appear to be any signs of obvious an active infection at this time which is great news. With that being said the patient unfortunately is continued to have issues with pain although he tells me it is gotten a lot better. He was on Keflex as well as Bactrim DS which seems to have done a good job there. His most recent hemoglobin A1c was 9.8 that was on 04/14/2020. Subsequently I do feel like that the patient is making progress here. He does have a history of chronic venous insufficiency as well as hypertension. I  do not see any need for antibiotics at this point. 5/25; follow-up of the wound on the left posterior calf. This is completely necrotic on the surface. It does not look infected but it is painful. He is a diabetic but I think there is some suggestion here of chronic venous disease as well. We used Iodoflex under compression last week 6/1; again a completely necrotic surface on this with the wound.  We have been using Iodoflex under compression he complains of gnawing pain. He has had wounds previously a lot of this looks like chronic venous disease. 6/8; about two thirds of the surface of this wound is still covered in a black necrotic eschar. I changed him from Iodoflex to [place] last week because of complaints of pain. He actually was seen by her neurologist this weeko Peripheral neuropathy as a cause of the pain and they changed him from Neurontin to Lyrica but he still has not had any relief. He says that most of the pain however is in his heel not in his wound per se. He is a diabetic 6/15. This is a patient with what looks to be a venous wound on the left lateral lower leg. He is also a diabetic. Currently being worked up for diabetic neuropathy. He complains of pain out of proportion to the size of the wound although to be fair the entire area here was eschared. We have been working to get a viable surface. We are using Sorbact 6/22; fairly painful wound on the left posterior calf. I am assuming this is been venous he is also a diabetic. His ABI in our clinic was noncompressible however he has easily palpable pulses on his feet. He complains of a lot of pain in the wound also of the left heel and the upper left calf. Some of this may be neuropathy. It is led me to discontinue Iodoflex reduce his compression but he still complains of pain in fact he says he could not take a debridement today. When I first saw this it was completely necrotic surface we have got it down to something that looks  a reasonably healthy I have been wondering about biopsying this area however he is on Coumadin and with the pain I have put this off. The major question would be an inflammatory ulcer such as pyoderma. The patient is not aware of how this started. He does however have a wound history on both legs. 6/29; patient presents for 1 week follow-up. He has been using Hydrofera Blue under Kerlix/Coban. He has tolerated this well. He currently denies signs of infection. He also declines debridement today. He he states it is painful and does not want to have this done. Patient History Information obtained from Patient. Social History Never smoker, Marital Status - Single, Alcohol Use - Moderate, Drug Use - No History, Caffeine Use - Never. Medical History Eyes Denies history of Cataracts, Glaucoma, Optic Neuritis Ear/Nose/Mouth/Throat Denies history of Chronic sinus problems/congestion Hematologic/Lymphatic Denies history of Anemia, Hemophilia, Human Immunodeficiency Virus, Lymphedema Respiratory Denies history of Aspiration, Asthma, Chronic Obstructive Pulmonary Disease (COPD), Pneumothorax, Sleep Apnea, Tuberculosis Cardiovascular Patient has history of Hypertension Denies history of Angina, Arrhythmia, Congestive Heart Failure, Coronary Artery Disease, Deep Vein Thrombosis, Hypotension, Myocardial Infarction, Peripheral Arterial Disease, Peripheral Venous Disease, Phlebitis, Vasculitis Gastrointestinal Denies history of Cirrhosis , Colitis, Crohn s, Hepatitis A, Hepatitis B Endocrine Patient has history of Type II Diabetes Denies history of Type I Diabetes Genitourinary Denies history of End Stage Renal Disease Immunological Denies history of Lupus Erythematosus, Raynaud s, Scleroderma Hascall, Jontae V. ([id number]) Integumentary (Skin) Denies history of History of Burn, History of pressure wounds Musculoskeletal Denies history of Gout, Rheumatoid Arthritis, Osteoarthritis,  Osteomyelitis Neurologic Patient has history of Neuropathy Denies history of Dementia, Quadriplegia, Paraplegia, Seizure Disorder Oncologic Denies history of Received Chemotherapy, Received Radiation Psychiatric Denies history of Anorexia/bulimia, Confinement Anxiety Objective Constitutional Vitals Time Taken: 3:17 PM, Height: 70 in, Weight:  290 lbs, BMI: 41.6, Temperature: 98.4 F, Pulse: 70 bpm, Respiratory Rate: 18 breaths/min, Blood Pressure: 101/61 mmHg. General Notes: Left lower extremity: Open wound to the posterior aspect. There is some granulation tissue and nonviable tissue present. It has an inflamed appearance. No obvious signs of infection. Integumentary (Hair, Skin) Wound #1 status is Open. Original cause of wound was Gradually Appeared. The date acquired was: 04/08/2020. The wound has been in treatment 6 weeks. The wound is located on the Left,Posterior Lower Leg. The wound measures 3.5cm length x 5.5cm width x 0.2cm depth; 15.119cm^2 area and 3.024cm^3 volume. There is Fat Layer (Subcutaneous Tissue) exposed. There is no tunneling or undermining noted. There is a medium amount of purulent drainage noted. There is small (1-33%) red, hyper - granulation within the wound bed. There is a large (67-100%) amount of necrotic tissue within the wound bed including Eschar and Adherent Slough. Assessment Active Problems ICD-10 Type 2 diabetes mellitus with other skin ulcer Venous insufficiency (chronic) (peripheral) Non-pressure chronic ulcer of other part of left lower leg with fat layer exposed Essential (primary) hypertension Patient's wound is overall stable with some signs of improvement from last clinic visit. He declined debridement today. I will add Santyl to the [facility] and place this under Kerlix/Coban. He is scheduled for venous reflux studies on 7/6. Procedures Wound #1 Pre-procedure diagnosis of Wound #1 is a Diabetic Wound/Ulcer of the Lower Extremity located  on the Left,Posterior Lower Leg .Severity of Tissue Pre Debridement is: Fat layer exposed. There was a Chemical/Enzymatic/Mechanical debridement performed by Kalman Shan, MD. With the following instrument(s): tongue blade. Agent used was [employer]. A time out was conducted at 14:10, prior to the start of the procedure. There was no bleeding. The procedure was tolerated well. Post Debridement Measurements: 3.5cm length x 5.5cm width x 0.2cm depth; 3.024cm^3 volume. Character of Wound/Ulcer Post Debridement is stable. Severity of Tissue Post Debridement is: Fat layer exposed. Post procedure Diagnosis Wound #1: Same as Pre-Procedure RYSHAWN, SANZONE ([id number]) Plan Follow-up Appointments: Return Appointment in 1 week. Nurse Visit as needed Bathing/ Shower/ Hygiene: May shower with wound dressing protected with water repellent cover or cast protector. - cover dressing during shower-do not get it wet Edema Control - Lymphedema / Segmental Compressive Device / Other: Elevate, Exercise Daily and Avoid Standing for Long Periods of Time. Elevate legs to the level of the heart and pump ankles as often as possible Elevate leg(s) parallel to the floor when sitting. WOUND #1: - Lower Leg Wound Laterality: Left, Posterior Cleanser: Soap and Water 1 x Per Week/30 Days Discharge Instructions: Gently cleanse wound with antibacterial soap, rinse and pat dry prior to dressing wounds Topical: Santyl Collagenase Ointment, 30 (gm), tube 1 x Per Week/30 Days Discharge Instructions: Apply to wound in clinic only. Primary Dressing: Hydrofera Blue Ready Transfer Foam, 2.5x2.5 (in/in) 1 x Per Week/30 Days Discharge Instructions: Apply Hydrofera Blue Ready to wound bed as directed Secondary Dressing: ABD Pad 5x9 (in/in) 1 x Per Week/30 Days Discharge Instructions: Cover with ABD pad Secured With: Coban Cohesive Bandage 4x5 (yds) Stretched 1 x Per Week/30 Days Discharge Instructions: Apply Coban from base of  toes to 3-finger-widths below knee Secured With: Kerlix Roll Sterile or Non-Sterile 6-ply 4.5x4 (yd/yd) 1 x Per Week/30 Days Discharge Instructions: Apply Kerlix from base of toes to 3-finger-widths below knee 1. Hydrofera Blue, Santyl under Kerlix/Coban 2. Follow-up in 1 week Electronic Signature(s) Signed: 07/01/2020 3:43:55 PM By: Kalman Shan DO Entered By: Kalman Shan on 07/01/2020  15:42:09 ZYMIER, RODGERS ([id number]) -------------------------------------------------------------------------------- ROS/PFSH Details Patient Name: Shane Bean Date of Service: 07/01/2020 1:00 PM Medical Record Number: [id number] Patient Account Number: [id number] Date of Birth/Sex: 03-04-1955 (64 y.o. M) Treating RN: Cornell Barman Primary Care Provider: Reeves Dam Other Clinician: Referring Provider: Reeves Dam Treating Provider/Extender: Yaakov Guthrie in Treatment: 6 Information Obtained From Patient Eyes Medical History: Negative for: Cataracts; Glaucoma; Optic Neuritis Ear/Nose/Mouth/Throat Medical History: Negative for: Chronic sinus problems/congestion Hematologic/Lymphatic Medical History: Negative for: Anemia; Hemophilia; Human Immunodeficiency Virus; Lymphedema Respiratory Medical History: Negative for: Aspiration; Asthma; Chronic Obstructive Pulmonary Disease (COPD); Pneumothorax; Sleep Apnea; Tuberculosis Cardiovascular Medical History: Positive for: Hypertension Negative for: Angina; Arrhythmia; Congestive Heart Failure; Coronary Artery Disease; Deep Vein Thrombosis; Hypotension; Myocardial Infarction; Peripheral Arterial Disease; Peripheral Venous Disease; Phlebitis; Vasculitis Gastrointestinal Medical History: Negative for: Cirrhosis ; Colitis; Crohnos; Hepatitis A; Hepatitis B Endocrine Medical History: Positive for: Type II Diabetes Negative for: Type I Diabetes Time with diabetes: 3 Treated with: Oral agents Blood sugar tested every day:  No Genitourinary Medical History: Negative for: End Stage Renal Disease Immunological Medical History: Negative for: Lupus Erythematosus; Raynaudos; Scleroderma Integumentary (Skin) Medical History: Negative for: History of Burn; History of pressure wounds Nelles, Othello V. ([id number]) Musculoskeletal Medical History: Negative for: Gout; Rheumatoid Arthritis; Osteoarthritis; Osteomyelitis Neurologic Medical History: Positive for: Neuropathy Negative for: Dementia; Quadriplegia; Paraplegia; Seizure Disorder Oncologic Medical History: Negative for: Received Chemotherapy; Received Radiation Psychiatric Medical History: Negative for: Anorexia/bulimia; Confinement Anxiety Immunizations Pneumococcal Vaccine: Received Pneumococcal Vaccination: Yes Implantable Devices None Family and Social History Never smoker; Marital Status - Single; Alcohol Use: Moderate; Drug Use: No History; Caffeine Use: Never; Financial Concerns: No; Food, Clothing or Shelter Needs: No; Support System Lacking: No; Transportation Concerns: No Electronic Signature(s) Signed: 07/01/2020 3:43:55 PM By: Kalman Shan DO Signed: 07/20/2020 1:36:15 PM By: Gretta Cool, BSN, RN, CWS, Kim RN, BSN Entered By: Kalman Shan on 07/01/2020 15:39:17 Shane Bean ([id number]) -------------------------------------------------------------------------------- [place] Details Patient Name: Shane Bean Date of Service: 07/01/2020 Medical Record Number: [id number] Patient Account Number: [id number] Date of Birth/Sex: 1955/05/01 (64 y.o. M) Treating RN: Cornell Barman Primary Care Provider: Reeves Dam Other Clinician: Referring Provider: Reeves Dam Treating Provider/Extender: Yaakov Guthrie in Treatment: 6 Diagnosis Coding ICD-10 Codes Code Description E11.622 Type 2 diabetes mellitus with other skin ulcer I87.2 Venous insufficiency (chronic) (peripheral) L97.822 Non-pressure chronic ulcer of other  part of left lower leg with fat layer exposed I10 Essential (primary) hypertension Facility Procedures CPT4 Code: [id number] Description: [phone] - DEBRIDE W/O ANES NON SELECT Modifier: Quantity: 1 Physician Procedures CPT4 Code: [id number] Description: 41638 - WC PHYS LEVEL 3 - EST PT Modifier: Quantity: 1 CPT4 Code: Description: ICD-10 Diagnosis Description 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 lay I87.2 Venous insufficiency (chronic) (peripheral) Modifier: er exposed Quantity: Electronic Signature(s) Signed: 07/01/2020 3:43:55 PM By: Kalman Shan DO Entered By: Kalman Shan on 07/01/2020 15:43:23

## 2020-07-20 NOTE — Progress Notes (Signed)
Shane Bean, Shane Bean ([id number]) Visit Report for 07/15/2020 Chief Complaint Document Details Patient Name: Shane Bean, Shane Bean Date of Service: 07/15/2020 3:00 PM Medical Record Number: [id number] Patient Account Number: [id number] Date of Birth/Sex: July 01, 1955 (65 y.o. M) Treating RN: Cornell Barman Primary Care Provider: Reeves Dam Other Clinician: Referring Provider: Reeves Dam Treating Provider/Extender: Yaakov Guthrie in Treatment: 8 Information Obtained from: Patient Chief Complaint Left LE Ulcer Electronic Signature(s) Signed: 07/17/2020 3:02:53 PM By: Kalman Shan DO Entered By: Kalman Shan on 07/17/2020 14:58:58 Shane Bean ([id number]) -------------------------------------------------------------------------------- Debridement Details Patient Name: Shane Bean Date of Service: 07/15/2020 3:00 PM Medical Record Number: [id number] Patient Account Number: [id number] Date of Birth/Sex: August 11, 1955 (65 y.o. M) Treating RN: Donnamarie Poag Primary Care Provider: Reeves Dam Other Clinician: Referring Provider: Reeves Dam Treating Provider/Extender: Yaakov Guthrie in Treatment: 8 Debridement Performed for Wound #1 Left,Posterior Lower Leg Assessment: Performed By: Physician Kalman Shan, MD Debridement Type: Debridement Severity of Tissue Pre Debridement: Fat layer exposed Level of Consciousness (Pre- Awake and Alert procedure): Pre-procedure Verification/Time Out Yes - 15:54 Taken: Start Time: 15:54 Total Area Debrided (L x W): 3 (cm) x 4 (cm) = 12 (cm) Tissue and other material Viable, Non-Viable, Slough, Subcutaneous, Skin: Dermis , Slough debrided: Level: Skin/Subcutaneous Tissue Debridement Description: Excisional Instrument: Curette, Forceps, Scissors Bleeding: Minimum Hemostasis Achieved: Pressure End Time: 15:57 Response to Treatment: Procedure was tolerated well Level of Consciousness (Post- Awake and  Alert procedure): Post Debridement Measurements of Total Wound Length: (cm) 6 Width: (cm) 8 Depth: (cm) 0.2 Volume: (cm) 7.54 Character of Wound/Ulcer Post Debridement: Improved Severity of Tissue Post Debridement: Fat layer exposed Post Procedure Diagnosis Same as Pre-procedure Electronic Signature(s) Signed: 07/16/2020 4:28:40 PM By: Donnamarie Poag Signed: 07/17/2020 3:02:53 PM By: Kalman Shan DO Entered By: Donnamarie Poag on 07/15/2020 15:57:26 Shane Bean ([id number]) -------------------------------------------------------------------------------- HPI Details Patient Name: Shane Bean Date of Service: 07/15/2020 3:00 PM Medical Record Number: [id number] Patient Account Number: [id number] Date of Birth/Sex: 1955-11-11 (65 y.o. M) Treating RN: Cornell Barman Primary Care Provider: Reeves Dam Other Clinician: Referring Provider: Reeves Dam Treating Provider/Extender: Yaakov Guthrie in Treatment: 8 History of Present Illness HPI Description: 05/18/2020 upon evaluation today patient appears for initial evaluation here in clinic concerning issues that he has been having with the wound on his left lateral leg. Fortunately there does not appear to be any signs of obvious an active infection at this time which is great news. With that being said the patient unfortunately is continued to have issues with pain although he tells me it is gotten a lot better. He was on Keflex as well as Bactrim DS which seems to have done a good job there. His most recent hemoglobin A1c was 9.8 that was on 04/14/2020. Subsequently I do feel like that the patient is making progress here. He does have a history of chronic venous insufficiency as well as hypertension. I do not see any need for antibiotics at this point. 5/25; follow-up of the wound on the left posterior calf. This is completely necrotic on the surface. It does not look infected but it is painful. He is a diabetic but I think  there is some suggestion here of chronic venous disease as well. We used Iodoflex under compression last week 6/1; again a completely necrotic surface on this with the wound. We have been using Iodoflex under compression he complains of gnawing pain. He has had wounds previously a lot of this looks like chronic venous disease. 6/8; about  two thirds of the surface of this wound is still covered in a black necrotic eschar. I changed him from Iodoflex to [place] last week because of complaints of pain. He actually was seen by her neurologist this weeko Peripheral neuropathy as a cause of the pain and they changed him from Neurontin to Lyrica but he still has not had any relief. He says that most of the pain however is in his heel not in his wound per se. He is a diabetic 6/15. This is a patient with what looks to be a venous wound on the left lateral lower leg. He is also a diabetic. Currently being worked up for diabetic neuropathy. He complains of pain out of proportion to the size of the wound although to be fair the entire area here was eschared. We have been working to get a viable surface. We are using Sorbact 6/22; fairly painful wound on the left posterior calf. I am assuming this is been venous he is also a diabetic. His ABI in our clinic was noncompressible however he has easily palpable pulses on his feet. He complains of a lot of pain in the wound also of the left heel and the upper left calf. Some of this may be neuropathy. It is led me to discontinue Iodoflex reduce his compression but he still complains of pain in fact he says he could not take a debridement today. When I first saw this it was completely necrotic surface we have got it down to something that looks a reasonably healthy I have been wondering about biopsying this area however he is on Coumadin and with the pain I have put this off. The major question would be an inflammatory ulcer such as pyoderma. The patient is not aware  of how this started. He does however have a wound history on both legs. 6/29; patient presents for 1 week follow-up. He has been using Hydrofera Blue under Kerlix/Coban. He has tolerated this well. He currently denies signs of infection. He also declines debridement today. He he states it is painful and does not want to have this done. 7/6; Patient presents for 1 week follow-up. He has been using Hydrofera Blue under Kerlix/Coban. He denies signs of infection. He declines debridement today. 7/15; patient presents for 1 week follow-up. He has been using Hydrofera Blue under Kerlix/Coban. He denies signs of infection today. He is agreeable to having debridement done today. Electronic Signature(s) Signed: 07/17/2020 3:02:53 PM By: Kalman Shan DO Entered By: Kalman Shan on 07/17/2020 14:59:37 Shane Bean ([id number]) -------------------------------------------------------------------------------- Physical Exam Details Patient Name: Shane Bean Date of Service: 07/15/2020 3:00 PM Medical Record Number: [id number] Patient Account Number: [id number] Date of Birth/Sex: 04/25/1955 (65 y.o. M) Treating RN: Cornell Barman Primary Care Provider: Reeves Dam Other Clinician: Referring Provider: Reeves Dam Treating Provider/Extender: Yaakov Guthrie in Treatment: 8 Constitutional . Psychiatric . Notes : Left lower extremity: Open wound to the posterior aspect. There is some granulation tissue and nonviable tissue present. It has an inflamed appearance. No obvious signs of infection. Electronic Signature(s) Signed: 07/17/2020 3:02:53 PM By: Kalman Shan DO Entered By: Kalman Shan on 07/17/2020 15:00:04 Shane Bean ([id number]) -------------------------------------------------------------------------------- Physician Orders Details Patient Name: Shane Bean Date of Service: 07/15/2020 3:00 PM Medical Record Number: [id number] Patient Account  Number: [id number] Date of Birth/Sex: 1955-02-05 (65 y.o. M) Treating RN: Donnamarie Poag Primary Care Provider: Reeves Dam Other Clinician: Referring Provider: Reeves Dam Treating Provider/Extender: Yaakov Guthrie in Treatment: 8 Verbal /  Phone Orders: No Diagnosis Coding Follow-up Appointments o Return Appointment in 1 week. o Nurse Visit as needed Bathing/ Shower/ Hygiene o May shower with wound dressing protected with water repellent cover or cast protector. - cover dressing during shower-do not get it wet Edema Control - Lymphedema / Segmental Compressive Device / Other o Elevate, Exercise Daily and Avoid Standing for Long Periods of Time. o Elevate legs to the level of the heart and pump ankles as often as possible o Elevate leg(s) parallel to the floor when sitting. Wound Treatment Wound #1 - Lower Leg Wound Laterality: Left, Posterior Cleanser: Soap and Water 1 x Per Week/30 Days Discharge Instructions: Gently cleanse wound with antibacterial soap, rinse and pat dry prior to dressing wounds Topical: Santyl Collagenase Ointment, 30 (gm), tube 1 x Per Week/30 Days Discharge Instructions: Apply to wound in clinic only. Primary Dressing: Hydrofera Blue Ready Transfer Foam, 2.5x2.5 (in/in) 1 x Per Week/30 Days Discharge Instructions: Apply Hydrofera Blue Ready to wound bed as directed Secondary Dressing: ABD Pad 5x9 (in/in) 1 x Per Week/30 Days Discharge Instructions: Cover with ABD pad Secured With: Coban Cohesive Bandage 4x5 (yds) Stretched 1 x Per Week/30 Days Discharge Instructions: Apply Coban from base of toes to 3-finger-widths below knee Secured With: Kerlix Roll Sterile or Non-Sterile 6-ply 4.5x4 (yd/yd) 1 x Per Week/30 Days Discharge Instructions: Apply Kerlix from base of toes to 3-finger-widths below knee Electronic Signature(s) Signed: 07/16/2020 4:28:40 PM By: Donnamarie Poag Signed: 07/17/2020 3:02:53 PM By: Kalman Shan DO Entered By: Donnamarie Poag on 07/15/2020 15:58:16 Shane Bean ([id number]) -------------------------------------------------------------------------------- Problem List Details Patient Name: Shane Bean Date of Service: 07/15/2020 3:00 PM Medical Record Number: [id number] Patient Account Number: [id number] Date of Birth/Sex: Jul 03, 1955 (65 y.o. M) Treating RN: Cornell Barman Primary Care Provider: Reeves Dam Other Clinician: Referring Provider: Reeves Dam Treating Provider/Extender: Yaakov Guthrie in Treatment: 8 Active Problems ICD-10 Encounter Code Description Active Date MDM Diagnosis E11.622 Type 2 diabetes mellitus with other skin ulcer 05/18/2020 No Yes I87.2 Venous insufficiency (chronic) (peripheral) 05/18/2020 No Yes 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 layer 05/18/2020 No Yes exposed [place] (primary) hypertension 05/18/2020 No Yes Inactive Problems Resolved Problems Electronic Signature(s) Signed: 07/17/2020 3:02:53 PM By: Kalman Shan DO Entered By: Kalman Shan on 07/17/2020 [place], [place]. ([id number]) -------------------------------------------------------------------------------- Progress Note Details Patient Name: Shane Bean Date of Service: 07/15/2020 3:00 PM Medical Record Number: [id number] Patient Account Number: [id number] Date of Birth/Sex: 1955-07-26 (65 y.o. M) Treating RN: Cornell Barman Primary Care Provider: Reeves Dam Other Clinician: Referring Provider: Reeves Dam Treating Provider/Extender: Yaakov Guthrie in Treatment: 8 Subjective Chief Complaint Information obtained from Patient Left LE Ulcer History of Present Illness (HPI) 05/18/2020 upon evaluation today patient appears for initial evaluation here in clinic concerning issues that he has been having with the wound on his left lateral leg. Fortunately there does not appear to be any signs of obvious an active  infection at this time which is great news. With that being said the patient unfortunately is continued to have issues with pain although he tells me it is gotten a lot better. He was on Keflex as well as Bactrim DS which seems to have done a good job there. His most recent hemoglobin A1c was 9.8 that was on 04/14/2020. Subsequently I do feel like that the patient is making progress here. He does have a history of chronic venous insufficiency as well as hypertension. I do  not see any need for antibiotics at this point. 5/25; follow-up of the wound on the left posterior calf. This is completely necrotic on the surface. It does not look infected but it is painful. He is a diabetic but I think there is some suggestion here of chronic venous disease as well. We used Iodoflex under compression last week 6/1; again a completely necrotic surface on this with the wound. We have been using Iodoflex under compression he complains of gnawing pain. He has had wounds previously a lot of this looks like chronic venous disease. 6/8; about two thirds of the surface of this wound is still covered in a black necrotic eschar. I changed him from Iodoflex to [place] last week because of complaints of pain. He actually was seen by her neurologist this weeko Peripheral neuropathy as a cause of the pain and they changed him from Neurontin to Lyrica but he still has not had any relief. He says that most of the pain however is in his heel not in his wound per se. He is a diabetic 6/15. This is a patient with what looks to be a venous wound on the left lateral lower leg. He is also a diabetic. Currently being worked up for diabetic neuropathy. He complains of pain out of proportion to the size of the wound although to be fair the entire area here was eschared. We have been working to get a viable surface. We are using Sorbact 6/22; fairly painful wound on the left posterior calf. I am assuming this is been venous he is also a  diabetic. His ABI in our clinic was noncompressible however he has easily palpable pulses on his feet. He complains of a lot of pain in the wound also of the left heel and the upper left calf. Some of this may be neuropathy. It is led me to discontinue Iodoflex reduce his compression but he still complains of pain in fact he says he could not take a debridement today. When I first saw this it was completely necrotic surface we have got it down to something that looks a reasonably healthy I have been wondering about biopsying this area however he is on Coumadin and with the pain I have put this off. The major question would be an inflammatory ulcer such as pyoderma. The patient is not aware of how this started. He does however have a wound history on both legs. 6/29; patient presents for 1 week follow-up. He has been using Hydrofera Blue under Kerlix/Coban. He has tolerated this well. He currently denies signs of infection. He also declines debridement today. He he states it is painful and does not want to have this done. 7/6; Patient presents for 1 week follow-up. He has been using Hydrofera Blue under Kerlix/Coban. He denies signs of infection. He declines debridement today. 7/15; patient presents for 1 week follow-up. He has been using Hydrofera Blue under Kerlix/Coban. He denies signs of infection today. He is agreeable to having debridement done today. Patient History Information obtained from Patient. Social History Never smoker, Marital Status - Single, Alcohol Use - Moderate, Drug Use - No History, Caffeine Use - Never. Medical History Eyes Denies history of Cataracts, Glaucoma, Optic Neuritis Ear/Nose/Mouth/Throat Denies history of Chronic sinus problems/congestion Hematologic/Lymphatic Denies history of Anemia, Hemophilia, Human Immunodeficiency Virus, Lymphedema Respiratory Denies history of Aspiration, Asthma, Chronic Obstructive Pulmonary Disease (COPD), Pneumothorax, Sleep  Apnea, Tuberculosis Cardiovascular Patient has history of Hypertension Denies history of Angina, Arrhythmia, Congestive Heart Failure, Coronary Artery  Disease, Deep Vein Thrombosis, Hypotension, Myocardial Infarction, Peripheral Arterial Disease, Peripheral Venous Disease, Phlebitis, Vasculitis Gastrointestinal Denies history of Cirrhosis , Colitis, Crohn s, Hepatitis A, Hepatitis B Endocrine EVER, GUSTAFSON ([id number]) Patient has history of Type II Diabetes Denies history of Type I Diabetes Genitourinary Denies history of End Stage Renal Disease Immunological Denies history of Lupus Erythematosus, Raynaud s, Scleroderma Integumentary (Skin) Denies history of History of Burn, History of pressure wounds Musculoskeletal Denies history of Gout, Rheumatoid Arthritis, Osteoarthritis, Osteomyelitis Neurologic Patient has history of Neuropathy Denies history of Dementia, Quadriplegia, Paraplegia, Seizure Disorder Oncologic Denies history of Received Chemotherapy, Received Radiation Psychiatric Denies history of Anorexia/bulimia, Confinement Anxiety Objective Constitutional Vitals Time Taken: 3:22 PM, Height: 70 in, Weight: 290 lbs, BMI: 41.6, Temperature: 99.4 F, Pulse: 81 bpm, Respiratory Rate: 18 breaths/min, Blood Pressure: 149/75 mmHg. General Notes: : Left lower extremity: Open wound to the posterior aspect. There is some granulation tissue and nonviable tissue present. It has an inflamed appearance. No obvious signs of infection. Integumentary (Hair, Skin) Wound #1 status is Open. Original cause of wound was Gradually Appeared. The date acquired was: 04/08/2020. The wound has been in treatment 8 weeks. The wound is located on the Left,Posterior Lower Leg. The wound measures 6cm length x 8cm width x 0.2cm depth; 37.699cm^2 area and 7.54cm^3 volume. There is Fat Layer (Subcutaneous Tissue) exposed. There is no tunneling or undermining noted. There is a large amount of  serosanguineous drainage noted. There is small (1-33%) red, pink granulation within the wound bed. There is a large (67-100%) amount of necrotic tissue within the wound bed including Eschar and Adherent Slough. Assessment Active Problems ICD-10 Type 2 diabetes mellitus with other skin ulcer Venous insufficiency (chronic) (peripheral) Non-pressure chronic ulcer of other part of left lower leg with fat layer exposed Essential (primary) hypertension Patient's wound is larger however the appearance has improved. It looks less irritated. I was able to debride some nonviable tissue. No signs of infection. I recommended continuing Hydrofera Blue and Santyl to the wound bed. We will do this under Kerlix/Coban. Procedures Wound #1 Pre-procedure diagnosis of Wound #1 is a Diabetic Wound/Ulcer of the Lower Extremity located on the Left,Posterior Lower Leg .Severity of Tissue Pre Debridement is: Fat layer exposed. There was a Excisional Skin/Subcutaneous Tissue Debridement with a total area of 12 sq cm performed by Kalman Shan, MD. With the following instrument(s): Curette, Forceps, and Scissors to remove Viable and Non-Viable tissue/material. Material removed includes Subcutaneous Tissue, Slough, and Skin: Dermis. A time out was conducted at 15:54, prior to the start of the procedure. A Grieco, Llewellyn V. ([id number]) Minimum amount of bleeding was controlled with Pressure. The procedure was tolerated well. Post Debridement Measurements: 6cm length x 8cm width x 0.2cm depth; 7.54cm^3 volume. Character of Wound/Ulcer Post Debridement is improved. Severity of Tissue Post Debridement is: Fat layer exposed. Post procedure Diagnosis Wound #1: Same as Pre-Procedure Plan Follow-up Appointments: Return Appointment in 1 week. Nurse Visit as needed Bathing/ Shower/ Hygiene: May shower with wound dressing protected with water repellent cover or cast protector. - cover dressing during shower-do not get it  wet Edema Control - Lymphedema / Segmental Compressive Device / Other: Elevate, Exercise Daily and Avoid Standing for Long Periods of Time. Elevate legs to the level of the heart and pump ankles as often as possible Elevate leg(s) parallel to the floor when sitting. WOUND #1: - Lower Leg Wound Laterality: Left, Posterior Cleanser: Soap and Water 1 x Per Week/30 Days Discharge Instructions: Gently  cleanse wound with antibacterial soap, rinse and pat dry prior to dressing wounds Topical: Santyl Collagenase Ointment, 30 (gm), tube 1 x Per Week/30 Days Discharge Instructions: Apply to wound in clinic only. Primary Dressing: Hydrofera Blue Ready Transfer Foam, 2.5x2.5 (in/in) 1 x Per Week/30 Days Discharge Instructions: Apply Hydrofera Blue Ready to wound bed as directed Secondary Dressing: ABD Pad 5x9 (in/in) 1 x Per Week/30 Days Discharge Instructions: Cover with ABD pad Secured With: Coban Cohesive Bandage 4x5 (yds) Stretched 1 x Per Week/30 Days Discharge Instructions: Apply Coban from base of toes to 3-finger-widths below knee Secured With: Kerlix Roll Sterile or Non-Sterile 6-ply 4.5x4 (yd/yd) 1 x Per Week/30 Days Discharge Instructions: Apply Kerlix from base of toes to 3-finger-widths below knee 1. In office sharp debridement 2. Hydrofera Blue and Santyl under Kerlix/Coban 3. Follow-up in 1 week Electronic Signature(s) Signed: 07/17/2020 3:02:53 PM By: Kalman Shan DO Entered By: Kalman Shan on 07/17/2020 15:01:51 Shane Bean ([id number]) -------------------------------------------------------------------------------- ROS/PFSH Details Patient Name: Shane Bean Date of Service: 07/15/2020 3:00 PM Medical Record Number: [id number] Patient Account Number: [id number] Date of Birth/Sex: August 12, 1955 (65 y.o. M) Treating RN: Cornell Barman Primary Care Provider: Reeves Dam Other Clinician: Referring Provider: Reeves Dam Treating Provider/Extender: Yaakov Guthrie in Treatment: 8 Information Obtained From Patient Eyes Medical History: Negative for: Cataracts; Glaucoma; Optic Neuritis Ear/Nose/Mouth/Throat Medical History: Negative for: Chronic sinus problems/congestion Hematologic/Lymphatic Medical History: Negative for: Anemia; Hemophilia; Human Immunodeficiency Virus; Lymphedema Respiratory Medical History: Negative for: Aspiration; Asthma; Chronic Obstructive Pulmonary Disease (COPD); Pneumothorax; Sleep Apnea; Tuberculosis Cardiovascular Medical History: Positive for: Hypertension Negative for: Angina; Arrhythmia; Congestive Heart Failure; Coronary Artery Disease; Deep Vein Thrombosis; Hypotension; Myocardial Infarction; Peripheral Arterial Disease; Peripheral Venous Disease; Phlebitis; Vasculitis Gastrointestinal Medical History: Negative for: Cirrhosis ; Colitis; Crohnos; Hepatitis A; Hepatitis B Endocrine Medical History: Positive for: Type II Diabetes Negative for: Type I Diabetes Time with diabetes: 3 Treated with: Oral agents Blood sugar tested every day: No Genitourinary Medical History: Negative for: End Stage Renal Disease Immunological Medical History: Negative for: Lupus Erythematosus; Raynaudos; Scleroderma Integumentary (Skin) Medical History: Negative for: History of Burn; History of pressure wounds Paczkowski, Mary V. ([id number]) Musculoskeletal Medical History: Negative for: Gout; Rheumatoid Arthritis; Osteoarthritis; Osteomyelitis Neurologic Medical History: Positive for: Neuropathy Negative for: Dementia; Quadriplegia; Paraplegia; Seizure Disorder Oncologic Medical History: Negative for: Received Chemotherapy; Received Radiation Psychiatric Medical History: Negative for: Anorexia/bulimia; Confinement Anxiety Immunizations Pneumococcal Vaccine: Received Pneumococcal Vaccination: Yes Implantable Devices None Family and Social History Never smoker; Marital Status - Single; Alcohol  Use: Moderate; Drug Use: No History; Caffeine Use: Never; Financial Concerns: No; Food, Clothing or Shelter Needs: No; Support System Lacking: No; Transportation Concerns: No Electronic Signature(s) Signed: 07/17/2020 3:02:53 PM By: Kalman Shan DO Signed: 07/20/2020 1:36:15 PM By: Gretta Cool, BSN, RN, CWS, Kim RN, BSN Entered By: Kalman Shan on 07/17/2020 14:59:44 Shane Bean ([id number]) -------------------------------------------------------------------------------- [facility] Details Patient Name: Shane Bean Date of Service: 07/15/2020 Medical Record Number: [id number] Patient Account Number: [id number] Date of Birth/Sex: Apr 05, 1955 (65 y.o. M) Treating RN: Donnamarie Poag Primary Care Provider: Reeves Dam Other Clinician: Referring Provider: Reeves Dam Treating Provider/Extender: Yaakov Guthrie in Treatment: 8 Diagnosis Coding ICD-10 Codes Code Description E11.622 Type 2 diabetes mellitus with other skin ulcer I87.2 Venous insufficiency (chronic) (peripheral) 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 layer exposed I10 Essential (primary) hypertension Facility Procedures CPT4 Code: [id number] Description: 42353 - DEB SUBQ TISSUE 20 SQ CM/< Modifier: Quantity: 1 CPT4 Code: Description:  ICD-10 Diagnosis Description 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

## 2020-07-20 NOTE — Progress Notes (Signed)
SANJIV, CASTORENA ([id number]) Visit Report for 07/01/2020 Arrival Information Details Patient Name: SIVAN, QUAST Date of Service: 07/01/2020 1:00 PM Medical Record Number: [id number] Patient Account Number: [id number] Date of Birth/Sex: 02/26/1955 (64 y.o. M) Treating RN: Donnamarie Poag Primary Care Sendy Pluta: Reeves Dam Other Clinician: Referring Amayia Ciano: Reeves Dam Treating Jaisa Defino/Extender: Yaakov Guthrie in Treatment: 6 Visit Information History Since Last Visit Added or deleted any medications: No Patient Arrived: Ambulatory Had a fall or experienced change in No Arrival Time: 13:17 activities of daily living that may affect Accompanied By: self risk of falls: Transfer Assistance: Manual Hospitalized since last visit: No Patient Requires Transmission-Based No Has Dressing in Place as Prescribed: Yes Precautions: Has Compression in Place as Prescribed: Yes Patient Has Alerts: Yes Pain Present Now: Yes Patient Alerts: Patient on Blood Thinner DIABETIC Electronic Signature(s) Signed: 07/01/2020 3:28:20 PM By: Donnamarie Poag Entered By: Donnamarie Poag on 07/01/2020 13:17:51 [name] ([id number]) -------------------------------------------------------------------------------- Encounter Discharge Information Details Patient Name: [name] Date of Service: 07/01/2020 1:00 PM Medical Record Number: [id number] Patient Account Number: [id number] Date of Birth/Sex: 1955/05/25 (64 y.o. M) Treating RN: Cornell Barman Primary Care Cydni Reddoch: Reeves Dam Other Clinician: Referring Quasean Frye: Reeves Dam Treating Laporsche Hoeger/Extender: Yaakov Guthrie in Treatment: 6 Encounter Discharge Information Items Post Procedure Vitals Discharge Condition: Stable Temperature (F): 98.4 Ambulatory Status: Ambulatory Pulse (bpm): 70 Discharge Destination: Home Respiratory Rate (breaths/min): 18 Transportation: Private Auto Blood Pressure (mmHg):  101/61 Accompanied By: self Schedule Follow-up Appointment: Yes Clinical Summary of Care: Patient Declined Electronic Signature(s) Signed: 07/20/2020 1:36:15 PM By: Gretta Cool, BSN, RN, CWS, Kim RN, BSN Entered By: Gretta Cool, BSN, RN, CWS, Kim on 07/01/2020 14:21:48 [name] ([id number]) -------------------------------------------------------------------------------- Lower Extremity Assessment Details Patient Name: [name] Date of Service: 07/01/2020 1:00 PM Medical Record Number: [id number] Patient Account Number: [id number] Date of Birth/Sex: 1955/02/06 (64 y.o. M) Treating RN: Donnamarie Poag Primary Care Kristof Nadeem: Reeves Dam Other Clinician: Referring Johnye Kist: Reeves Dam Treating Revella Shelton/Extender: Yaakov Guthrie in Treatment: 6 Edema Assessment Assessed: [Left: Yes] [Right: No] [Left: Edema] [Right: :] Calf Left: Right: Point of Measurement: 34 cm From Medial Instep 46 cm Ankle Left: Right: Point of Measurement: 10 cm From Medial Instep 25 cm Knee To Floor Left: Right: From Medial Instep 44 cm Vascular Assessment Pulses: Dorsalis Pedis Palpable: [Left:Yes] Electronic Signature(s) Signed: 07/01/2020 3:28:20 PM By: Donnamarie Poag Entered By: Donnamarie Poag on 07/01/2020 13:28:19 [name] ([id number]) -------------------------------------------------------------------------------- Multi Wound Chart Details Patient Name: [name] Date of Service: 07/01/2020 1:00 PM Medical Record Number: [id number] Patient Account Number: [id number] Date of Birth/Sex: Jul 15, 1955 (64 y.o. M) Treating RN: Cornell Barman Primary Care Darcel Frane: Reeves Dam Other Clinician: Referring Karlynn Furrow: Reeves Dam Treating Yaasir Menken/Extender: Yaakov Guthrie in Treatment: 6 Vital Signs Height(in): 70 Pulse(bpm): 70 Weight(lbs): 290 Blood Pressure(mmHg): 101/61 Body Mass Index(BMI): 42 Temperature(F): 98.4 Respiratory Rate(breaths/min): 18 Photos:  [N/A:N/A] Wound Location: Left, Posterior Lower Leg N/A N/A Wounding Event: Gradually Appeared N/A N/A Primary Etiology: Diabetic Wound/Ulcer of the Lower N/A N/A Extremity Comorbid History: Hypertension, Type II Diabetes, N/A N/A Neuropathy Date Acquired: 04/08/2020 N/A N/A Weeks of Treatment: 6 N/A N/A Wound Status: Open N/A N/A Measurements L x W x D (cm) 3.5x5.5x0.2 N/A N/A Area (cm) : 15.119 N/A N/A Volume (cm) : 3.024 N/A N/A % Reduction in Area: -37.50% N/A N/A % Reduction in Volume: -174.90% N/A N/A Classification: Grade 2 N/A N/A Exudate Amount: Medium N/A N/A Exudate Type: Purulent N/A N/A Exudate Color: yellow, brown, green  N/A N/A Granulation Amount: Small (1-33%) N/A N/A Granulation Quality: Red, Hyper-granulation N/A N/A Necrotic Amount: Large (67-100%) N/A N/A Necrotic Tissue: Eschar, Adherent Slough N/A N/A Exposed Structures: Fat Layer (Subcutaneous Tissue): N/A N/A Yes Fascia: No Tendon: No Muscle: No Joint: No Bone: No Epithelialization: Small (1-33%) N/A N/A Debridement: Chemical/Enzymatic/Mechanical N/A N/A Pre-procedure Verification/Time 14:10 N/A N/A Out Taken: Instrument: Other(tongue blade) N/A N/A Bleeding: None N/A N/A Hemostasis Achieved: Pressure N/A N/A Debridement Treatment Procedure was tolerated well N/A N/A Response: Post Debridement 3.5x5.5x0.2 N/A N/A Measurements L x W x D (cm) Post Debridement Volume: 3.024 N/A N/A (cm) Procedures Performed: Debridement N/A N/A DARWIN, ROTHLISBERGER ([id number]) Treatment Notes Wound #1 (Lower Leg) Wound Laterality: Left, Posterior Cleanser Soap and Water Discharge Instruction: Gently cleanse wound with antibacterial soap, rinse and pat dry prior to dressing wounds Peri-Wound Care Topical Santyl Collagenase Ointment, 30 (gm), tube Discharge Instruction: Apply to wound in clinic only. Primary Dressing Hydrofera Blue Ready Transfer Foam, 2.5x2.5 (in/in) Discharge Instruction: Apply Hydrofera  Blue Ready to wound bed as directed Secondary Dressing ABD Pad 5x9 (in/in) Discharge Instruction: Cover with ABD pad Secured With Coban Cohesive Bandage 4x5 (yds) Stretched Discharge Instruction: Apply Coban from base of toes to 3-finger-widths below knee Kerlix Roll Sterile or Non-Sterile 6-ply 4.5x4 (yd/yd) Discharge Instruction: Apply Kerlix from base of toes to 3-finger-widths below knee Compression Wrap Compression Stockings Add-Ons Electronic Signature(s) Signed: 07/01/2020 3:43:55 PM By: Kalman Shan DO Entered By: Kalman Shan on 07/01/2020 15:38:49 [name] ([id number]) -------------------------------------------------------------------------------- [place] Details Patient Name: [name] Date of Service: 07/01/2020 1:00 PM Medical Record Number: [id number] Patient Account Number: [id number] Date of Birth/Sex: 04/26/1955 (64 y.o. M) Treating RN: Cornell Barman Primary Care Adaja Wander: Reeves Dam Other Clinician: Referring Adarsh Mundorf: Reeves Dam Treating Myshawn Chiriboga/Extender: Yaakov Guthrie in Treatment: 6 Active Inactive Nutrition Nursing Diagnoses: Imbalanced nutrition Impaired glucose control: actual or potential Potential for alteratiion in Nutrition/Potential for imbalanced nutrition Goals: Patient/caregiver agrees to and verbalizes understanding of need to obtain nutritional consultation Date Initiated: 05/18/2020 Target Resolution Date: 06/19/2020 Goal Status: Active Interventions: Assess HgA1c results as ordered upon admission and as needed Assess patient nutrition upon admission and as needed per policy Provide education on elevated blood sugars and impact on wound healing Notes: Wound/Skin Impairment Nursing Diagnoses: Impaired tissue integrity Knowledge deficit related to smoking impact on wound healing Knowledge deficit related to ulceration/compromised skin integrity Goals: Patient/caregiver will  verbalize understanding of skin care regimen Date Initiated: 05/18/2020 Target Resolution Date: 06/19/2020 Goal Status: Active Ulcer/skin breakdown will have a volume reduction of 30% by week 4 Date Initiated: 05/18/2020 Target Resolution Date: 06/15/2020 Goal Status: Active Ulcer/skin breakdown will have a volume reduction of 50% by week 8 Date Initiated: 05/18/2020 Target Resolution Date: 07/13/2020 Goal Status: Active Ulcer/skin breakdown will have a volume reduction of 80% by week 12 Date Initiated: 05/18/2020 Target Resolution Date: 08/10/2020 Goal Status: Active Interventions: Assess patient/caregiver ability to obtain necessary supplies Assess patient/caregiver ability to perform ulcer/skin care regimen upon admission and as needed Assess ulceration(s) every visit Notes: Electronic Signature(s) Signed: 07/20/2020 1:36:15 PM By: Gretta Cool, BSN, RN, CWS, Kim RN, BSN Entered By: Gretta Cool, BSN, RN, CWS, Kim on 07/01/2020 14:02:51 [name] ([id number]) -------------------------------------------------------------------------------- Pain Assessment Details Patient Name: [name] Date of Service: 07/01/2020 1:00 PM Medical Record Number: [id number] Patient Account Number: [id number] Date of Birth/Sex: [birth year] (64 y.o. M) Treating RN: Donnamarie Poag Primary Care Jaystin Mcgarvey: Reeves Dam Other Clinician: Referring Mickell Birdwell: Reeves Dam Treating Caylon Saine/Extender:  Kalman Shan Weeks in Treatment: 6 Active Problems Location of Pain Severity and Description of Pain Patient Has Paino Yes Site Locations Pain Location: Generalized Pain, Pain in Ulcers Rate the pain. Current Pain Level: 8 Pain Management and Medication Current Pain Management: Electronic Signature(s) Signed: 07/01/2020 3:28:20 PM By: Donnamarie Poag Entered By: Donnamarie Poag on 07/01/2020 13:21:08 [name]  ([id number]) -------------------------------------------------------------------------------- Patient/Caregiver Education Details Patient Name: [name] Date of Service: 07/01/2020 1:00 PM Medical Record Number: [id number] Patient Account Number: [id number] Date of Birth/Gender: 1955/03/11 (64 y.o. M) Treating RN: Cornell Barman Primary Care Physician: Reeves Dam Other Clinician: Referring Physician: Reeves Dam Treating Physician/Extender: Yaakov Guthrie in Treatment: 6 Education Assessment Education Provided To: Patient Education Topics Provided Wound Debridement: Handouts: Wound Debridement Methods: Demonstration, Explain/Verbal Responses: State content correctly Wound/Skin Impairment: Handouts: Caring for Your Ulcer Methods: Demonstration, Explain/Verbal Responses: State content correctly Electronic Signature(s) Signed: 07/20/2020 1:36:15 PM By: Gretta Cool, BSN, RN, CWS, Kim RN, BSN Entered By: Gretta Cool, BSN, RN, CWS, Kim on 07/01/2020 14:11:34 [name] ([id number]) -------------------------------------------------------------------------------- Wound Assessment Details Patient Name: [name] Date of Service: 07/01/2020 1:00 PM Medical Record Number: [id number] Patient Account Number: [id number] Date of Birth/Sex: 1955/08/30 (64 y.o. M) Treating RN: Donnamarie Poag Primary Care Alp Goldwater: Reeves Dam Other Clinician: Referring Arman Loy: Reeves Dam Treating Jaamal Farooqui/Extender: Yaakov Guthrie in Treatment: 6 Wound Status Wound Number: 1 Primary Etiology: Diabetic Wound/Ulcer of the Lower Extremity Wound Location: Left, Posterior Lower Leg Wound Status: Open Wounding Event: Gradually Appeared Comorbid History: Hypertension, Type II Diabetes, Neuropathy Date Acquired: 04/08/2020 Weeks Of Treatment: 6 Clustered Wound: No Photos Wound Measurements Length: (cm) 3.5 Width: (cm) 5.5 Depth: (cm) 0.2 Area: (cm) 15.119 Volume: (cm)  3.024 % Reduction in Area: -37.5% % Reduction in Volume: -174.9% Epithelialization: Small (1-33%) Tunneling: No Undermining: No Wound Description Classification: Grade 2 Exudate Amount: Medium Exudate Type: Purulent Exudate Color: yellow, brown, green Foul Odor After Cleansing: No Slough/Fibrino Yes Wound Bed Granulation Amount: Small (1-33%) Exposed Structure Granulation Quality: Red, Hyper-granulation Fascia Exposed: No Necrotic Amount: Large (67-100%) Fat Layer (Subcutaneous Tissue) Exposed: Yes Necrotic Quality: Eschar, Adherent Slough Tendon Exposed: No Muscle Exposed: No Joint Exposed: No Bone Exposed: No Electronic Signature(s) Signed: 07/01/2020 3:28:20 PM By: Donnamarie Poag Entered By: Donnamarie Poag on 07/01/2020 13:26:19 [name] ([id number]) -------------------------------------------------------------------------------- [place] Details Patient Name: [name] Date of Service: 07/01/2020 1:00 PM Medical Record Number: [id number] Patient Account Number: [id number] Date of Birth/Sex: 1955-10-01 (64 y.o. M) Treating RN: Donnamarie Poag Primary Care Rozina Pointer: Reeves Dam Other Clinician: Referring Devantae Babe: Reeves Dam Treating Lajoya Dombek/Extender: Yaakov Guthrie in Treatment: 6 Vital Signs Time Taken: 15:17 Temperature (F): 98.4 Height (in): 70 Pulse (bpm): 70 Weight (lbs): 290 Respiratory Rate (breaths/min): 18 Body Mass Index (BMI): 41.6 Blood Pressure (mmHg): 101/61 Reference Range: 80 - 120 mg / dl Electronic Signature(s) Signed: 07/01/2020 3:28:20 PM By: Donnamarie Poag Entered ByDonnamarie Poag on 07/01/2020 13:20:42

## 2020-07-22 ENCOUNTER — Encounter (HOSPITAL_BASED_OUTPATIENT_CLINIC_OR_DEPARTMENT_OTHER): Payer: Medicare (Managed Care) | Admitting: Internal Medicine

## 2020-07-22 ENCOUNTER — Other Ambulatory Visit: Payer: Self-pay

## 2020-07-22 DIAGNOSIS — L97822 Non-pressure chronic ulcer of other part of left lower leg with fat layer exposed: Secondary | ICD-10-CM | POA: Diagnosis not present

## 2020-07-22 DIAGNOSIS — E11622 Type 2 diabetes mellitus with other skin ulcer: Secondary | ICD-10-CM | POA: Diagnosis not present

## 2020-07-23 NOTE — Progress Notes (Signed)
Shane Bean ([id number]) Visit Report for 07/22/2020 Arrival Information Details Patient Name: Shane Bean, Shane Bean Date of Service: 07/22/2020 3:00 PM Medical Record Number: [id number] Patient Account Number: [id number] Date of Birth/Sex: 1955-07-22 (64 y.o. M) Treating RN: Donnamarie Poag Primary Care Leanard Dimaio: Reeves Dam Other Clinician: Referring Merl Bommarito: Reeves Dam Treating Bradshaw Minihan/Extender: Yaakov Guthrie in Treatment: 9 Visit Information History Since Last Visit Added or deleted any medications: No Patient Arrived: Ambulatory Had a fall or experienced change in No Arrival Time: 15:12 activities of daily living that may affect Accompanied By: self risk of falls: Transfer Assistance: None Hospitalized since last visit: No Patient Identification Verified: Yes Has Dressing in Place as Prescribed: Yes Secondary Verification Process Completed: Yes Has Compression in Place as Prescribed: Yes Patient Requires Transmission-Based No Pain Present Now: Yes Precautions: Patient Has Alerts: Yes Patient Alerts: Patient on Blood Thinner DIABETIC Electronic Signature(s) Signed: 07/23/2020 10:03:03 AM By: Donnamarie Poag Entered By: Donnamarie Poag on 07/22/2020 15:13:02 Shane Bean ([id number]) -------------------------------------------------------------------------------- Clinic Level of Care Assessment Details Patient Name: Shane Bean Date of Service: 07/22/2020 3:00 PM Medical Record Number: [id number] Patient Account Number: [id number] Date of Birth/Sex: 21-Aug-1955 (64 y.o. M) Treating RN: Donnamarie Poag Primary Care Keidrick Murty: Reeves Dam Other Clinician: Referring Keylie Beavers: Reeves Dam Treating Hakeen Shipes/Extender: Yaakov Guthrie in Treatment: 9 Clinic Level of Care Assessment Items TOOL 1 Quantity Score []  - Use when EandM and Procedure is performed on INITIAL visit 0 ASSESSMENTS - Nursing Assessment / Reassessment []  - General Physical Exam  (combine w/ comprehensive assessment (listed just below) when performed on new 0 pt. evals) []  - 0 Comprehensive Assessment (HX, ROS, Risk Assessments, Wounds Hx, etc.) ASSESSMENTS - Wound and Skin Assessment / Reassessment []  - Dermatologic / Skin Assessment (not related to wound area) 0 ASSESSMENTS - Ostomy and/or Continence Assessment and Care []  - Incontinence Assessment and Management 0 []  - 0 Ostomy Care Assessment and Management (repouching, etc.) PROCESS - Coordination of Care []  - Simple Patient / Family Education for ongoing care 0 []  - 0 Complex (extensive) Patient / Family Education for ongoing care []  - 0 Staff obtains Programmer, systems, Records, Test Results / Process Orders []  - 0 Staff telephones HHA, Nursing Homes / Clarify orders / etc []  - 0 Routine Transfer to another Facility (non-emergent condition) []  - 0 Routine Hospital Admission (non-emergent condition) []  - 0 New Admissions / Biomedical engineer / Ordering NPWT, Apligraf, etc. []  - 0 Emergency Hospital Admission (emergent condition) PROCESS - Special Needs []  - Pediatric / Minor Patient Management 0 []  - 0 Isolation Patient Management []  - 0 Hearing / Language / Visual special needs []  - 0 Assessment of Community assistance (transportation, D/C planning, etc.) []  - 0 Additional assistance / Altered mentation []  - 0 Support Surface(s) Assessment (bed, cushion, seat, etc.) INTERVENTIONS - Miscellaneous []  - External ear exam 0 []  - 0 Patient Transfer (multiple staff / Civil Service fast streamer / Similar devices) []  - 0 Simple Staple / Suture removal (25 or less) []  - 0 Complex Staple / Suture removal (26 or more) []  - 0 Hypo/Hyperglycemic Management (do not check if billed separately) []  - 0 Ankle / Brachial Index (ABI) - do not check if billed separately Has the patient been seen at the hospital within the last three years: Yes Total Score: 0 Level Of Care: ____ Shane Bean ([id number]) Electronic  Signature(s) Signed: 07/23/2020 10:03:03 AM By: Donnamarie Poag Entered By: Donnamarie Poag on 07/22/2020 15:39:37 Shane Bean ([id number]) -------------------------------------------------------------------------------- Encounter Discharge  Information Details Patient Name: Shane Bean, Shane Bean Date of Service: 07/22/2020 3:00 PM Medical Record Number: [id number] Patient Account Number: [id number] Date of Birth/Sex: 10/26/1955 (64 y.o. M) Treating RN: Donnamarie Poag Primary Care June Vacha: Reeves Dam Other Clinician: Referring Tong Pieczynski: Reeves Dam Treating Hopelynn Gartland/Extender: Yaakov Guthrie in Treatment: 9 Encounter Discharge Information Items Post Procedure Vitals Discharge Condition: Stable Temperature (F): 98.5 Ambulatory Status: Ambulatory Pulse (bpm): 93 Discharge Destination: Home Respiratory Rate (breaths/min): 16 Transportation: Private Auto Blood Pressure (mmHg): 143/80 Accompanied By: self Schedule Follow-up Appointment: Yes Clinical Summary of Care: Electronic Signature(s) Signed: 07/23/2020 10:03:03 AM By: Donnamarie Poag Entered By: Donnamarie Poag on 07/22/2020 15:47:08 Shane Bean ([id number]) -------------------------------------------------------------------------------- Lower Extremity Assessment Details Patient Name: Shane Bean Date of Service: 07/22/2020 3:00 PM Medical Record Number: [id number] Patient Account Number: [id number] Date of Birth/Sex: 1955-05-16 (64 y.o. M) Treating RN: Donnamarie Poag Primary Care Mai Longnecker: Reeves Dam Other Clinician: Referring Colyn Miron: Reeves Dam Treating Eddie Payette/Extender: Yaakov Guthrie in Treatment: 9 Edema Assessment Assessed: Shirlyn Goltz: Yes] Patrice Paradise: No] [Left: Edema] [Right: :] Calf Left: Right: Point of Measurement: 34 cm From Medial Instep 43 cm Ankle Left: Right: Point of Measurement: 10 cm From Medial Instep 24.5 cm Vascular Assessment Pulses: Dorsalis Pedis Palpable:  [Left:Yes] Electronic Signature(s) Signed: 07/23/2020 10:03:03 AM By: Donnamarie Poag Entered By: Donnamarie Poag on 07/22/2020 15:18:56 Shane Bean ([id number]) -------------------------------------------------------------------------------- Multi Wound Chart Details Patient Name: Shane Bean Date of Service: 07/22/2020 3:00 PM Medical Record Number: [id number] Patient Account Number: [id number] Date of Birth/Sex: 1955-12-17 (64 y.o. M) Treating RN: Donnamarie Poag Primary Care Dorianne Perret: Reeves Dam Other Clinician: Referring Cassidee Deats: Reeves Dam Treating Sylvan Lahm/Extender: Yaakov Guthrie in Treatment: 9 Vital Signs Height(in): 70 Pulse(bpm): 93 Weight(lbs): 290 Blood Pressure(mmHg): 143/80 Body Mass Index(BMI): 42 Temperature(F): 98.5 Respiratory Rate(breaths/min): 16 Photos: [N/A:N/A] Wound Location: Left, Posterior Lower Leg N/A N/A Wounding Event: Gradually Appeared N/A N/A Primary Etiology: Diabetic Wound/Ulcer of the Lower N/A N/A Extremity Comorbid History: Hypertension, Type II Diabetes, N/A N/A Neuropathy Date Acquired: 04/08/2020 N/A N/A Weeks of Treatment: 9 N/A N/A Wound Status: Open N/A N/A Measurements L x W x D (cm) 5.5x6.3x0.2 N/A N/A Area (cm) : 27.214 N/A N/A Volume (cm) : 5.443 N/A N/A % Reduction in Area: -147.50% N/A N/A % Reduction in Volume: -394.80% N/A N/A Classification: Grade 2 N/A N/A Exudate Amount: Large N/A N/A Exudate Type: Serosanguineous N/A N/A Exudate Color: red, brown N/A N/A Granulation Amount: Medium (34-66%) N/A N/A Granulation Quality: Red, Pink N/A N/A Necrotic Amount: Medium (34-66%) N/A N/A Necrotic Tissue: Eschar, Adherent Slough N/A N/A Exposed Structures: Fat Layer (Subcutaneous Tissue): N/A N/A Yes Fascia: No Tendon: No Muscle: No Joint: No Bone: No Epithelialization: Small (1-33%) N/A N/A Debridement: Debridement - Excisional N/A N/A Pre-procedure Verification/Time 15:39 N/A N/A Out Taken: Pain  Control: Lidocaine N/A N/A Tissue Debrided: Necrotic/Eschar, Subcutaneous, N/A N/A Slough Level: Skin/Subcutaneous Tissue N/A N/A Debridement Area (sq cm): 4 N/A N/A Instrument: Curette N/A N/A Bleeding: Moderate N/A N/A Hemostasis Achieved: Pressure N/A N/A Debridement Treatment Procedure was tolerated well N/A N/A ResponseDEMITRIS, Shane Bean ([id number]) Post Debridement 5.5x6.3x0.2 N/A N/A Measurements L x W x D (cm) Post Debridement Volume: 5.443 N/A N/A (cm) Procedures Performed: Debridement N/A N/A Treatment Notes Electronic Signature(s) Signed: 07/22/2020 3:51:01 PM By: Kalman Shan DO Entered By: Kalman Shan on 07/22/2020 15:44:38 Shane Bean ([id number]) -------------------------------------------------------------------------------- Multi-Disciplinary Care Plan Details Patient Name: Shane Bean Date of Service: 07/22/2020 3:00 PM Medical Record Number: [id number] Patient Account Number: [id number]  Date of Birth/Sex: 1955/03/22 (65 y.o. M) Treating RN: Donnamarie Poag Primary Care Giovanna Kemmerer: Reeves Dam Other Clinician: Referring Bran Aldridge: Reeves Dam Treating Charnise Lovan/Extender: Yaakov Guthrie in Treatment: 9 Active Inactive Wound/Skin Impairment Nursing Diagnoses: Impaired tissue integrity Knowledge deficit related to smoking impact on wound healing Knowledge deficit related to ulceration/compromised skin integrity Goals: Patient/caregiver will verbalize understanding of skin care regimen Date Initiated: 05/18/2020 Date Inactivated: 07/08/2020 Target Resolution Date: 06/19/2020 Goal Status: Met Ulcer/skin breakdown will have a volume reduction of 30% by week 4 Date Initiated: 05/18/2020 Date Inactivated: 07/08/2020 Target Resolution Date: 06/15/2020 Goal Status: Met Ulcer/skin breakdown will have a volume reduction of 50% by week 8 Date Initiated: 05/18/2020 Date Inactivated: 07/15/2020 Target Resolution Date: 07/13/2020 Goal Status:  Unmet Unmet Reason: con't tx Ulcer/skin breakdown will have a volume reduction of 80% by week 12 Date Initiated: 05/18/2020 Target Resolution Date: 08/10/2020 Goal Status: Active Interventions: Assess patient/caregiver ability to obtain necessary supplies Assess patient/caregiver ability to perform ulcer/skin care regimen upon admission and as needed Assess ulceration(s) every visit Notes: Electronic Signature(s) Signed: 07/23/2020 10:03:03 AM By: Donnamarie Poag Entered By: Donnamarie Poag on 07/22/2020 15:19:09 Shane Bean ([id number]) -------------------------------------------------------------------------------- Pain Assessment Details Patient Name: Shane Bean Date of Service: 07/22/2020 3:00 PM Medical Record Number: [id number] Patient Account Number: [id number] Date of Birth/Sex: [birth year] (64 y.o. M) Treating RN: Donnamarie Poag Primary Care Katharine Rochefort: Reeves Dam Other Clinician: Referring Kellan Boehlke: Reeves Dam Treating Erva Koke/Extender: Yaakov Guthrie in Treatment: 9 Active Problems Location of Pain Severity and Description of Pain Patient Has Paino Yes Site Locations Pain Location: Pain in Ulcers Rate the pain. Current Pain Level: 8 Pain Management and Medication Current Pain Management: Electronic Signature(s) Signed: 07/23/2020 10:03:03 AM By: Donnamarie Poag Entered By: Donnamarie Poag on 07/22/2020 15:13:45 Shane Bean ([id number]) -------------------------------------------------------------------------------- Patient/Caregiver Education Details Patient Name: Shane Bean Date of Service: 07/22/2020 3:00 PM Medical Record Number: [id number] Patient Account Number: [id number] Date of Birth/Gender: Jun 18, 1955 (64 y.o. M) Treating RN: Donnamarie Poag Primary Care Physician: Reeves Dam Other Clinician: Referring Physician: Reeves Dam Treating Physician/Extender: Yaakov Guthrie in Treatment: 9 Education Assessment Education Provided  To: Patient Education Topics Provided Wound/Skin Impairment: Electronic Signature(s) Signed: 07/23/2020 10:03:03 AM By: Donnamarie Poag Entered By: Donnamarie Poag on 07/22/2020 15:30:14 Shane Bean ([id number]) -------------------------------------------------------------------------------- Wound Assessment Details Patient Name: Shane Bean Date of Service: 07/22/2020 3:00 PM Medical Record Number: [id number] Patient Account Number: [id number] Date of Birth/Sex: 10/07/1955 (64 y.o. M) Treating RN: Donnamarie Poag Primary Care Denetta Fei: Reeves Dam Other Clinician: Referring Glori Machnik: Reeves Dam Treating Donyale Berthold/Extender: Yaakov Guthrie in Treatment: 9 Wound Status Wound Number: 1 Primary Etiology: Diabetic Wound/Ulcer of the Lower Extremity Wound Location: Left, Posterior Lower Leg Wound Status: Open Wounding Event: Gradually Appeared Comorbid History: Hypertension, Type II Diabetes, Neuropathy Date Acquired: 04/08/2020 Weeks Of Treatment: 9 Clustered Wound: No Photos Wound Measurements Length: (cm) 5.5 Width: (cm) 6.3 Depth: (cm) 0.2 Area: (cm) 27.214 Volume: (cm) 5.443 % Reduction in Area: -147.5% % Reduction in Volume: -394.8% Epithelialization: Small (1-33%) Tunneling: No Undermining: No Wound Description Classification: Grade 2 Exudate Amount: Large Exudate Type: Serosanguineous Exudate Color: red, brown Foul Odor After Cleansing: No Slough/Fibrino Yes Wound Bed Granulation Amount: Medium (34-66%) Exposed Structure Granulation Quality: Red, Pink Fascia Exposed: No Necrotic Amount: Medium (34-66%) Fat Layer (Subcutaneous Tissue) Exposed: Yes Necrotic Quality: Eschar, Adherent Slough Tendon Exposed: No Muscle Exposed: No Joint Exposed: No Bone Exposed: No Electronic Signature(s) Signed: 07/23/2020 10:03:03 AM By: Donnamarie Poag Entered ByLyndel Safe,  Joy on 07/22/2020 15:17:38 Shane Bean, Shane Bean  ([id number]) -------------------------------------------------------------------------------- Vitals Details Patient Name: Shane Bean Date of Service: 07/22/2020 3:00 PM Medical Record Number: [id number] Patient Account Number: [id number] Date of Birth/Sex: November 25, 1955 (64 y.o. M) Treating RN: Donnamarie Poag Primary Care Renda Pohlman: Reeves Dam Other Clinician: Referring Ghina Bittinger: Reeves Dam Treating Taryn Shellhammer/Extender: Yaakov Guthrie in Treatment: 9 Vital Signs Time Taken: 15:13 Temperature (F): 98.5 Height (in): 70 Pulse (bpm): 93 Weight (lbs): 290 Respiratory Rate (breaths/min): 16 Body Mass Index (BMI): 41.6 Blood Pressure (mmHg): 143/80 Reference Range: 80 - 120 mg / dl Electronic Signature(s) Signed: 07/23/2020 10:03:03 AM By: Donnamarie Poag Entered ByDonnamarie Poag on 07/22/2020 15:13:29

## 2020-07-23 NOTE — Progress Notes (Signed)
ADALID, BECKMANN ([id number]) Visit Report for 07/22/2020 Chief Complaint Document Details Patient Name: Shane Bean, Shane Bean Date of Service: 07/22/2020 3:00 PM Medical Record Number: [id number] Patient Account Number: [id number] Date of Birth/Sex: 02-11-1955 (65 y.o. M) Treating RN: Cornell Barman Primary Care Provider: Reeves Dam Other Clinician: Referring Provider: Reeves Dam Treating Provider/Extender: Yaakov Guthrie in Treatment: 9 Information Obtained from: Patient Chief Complaint Left LE Ulcer Electronic Signature(s) Signed: 07/22/2020 3:51:01 PM By: Kalman Shan DO Entered By: Kalman Shan on 07/22/2020 15:44:55 Shane Bean ([id number]) -------------------------------------------------------------------------------- Debridement Details Patient Name: Shane Bean Date of Service: 07/22/2020 3:00 PM Medical Record Number: [id number] Patient Account Number: [id number] Date of Birth/Sex: 1955-05-28 (65 y.o. M) Treating RN: Donnamarie Poag Primary Care Provider: Reeves Dam Other Clinician: Referring Provider: Reeves Dam Treating Provider/Extender: Yaakov Guthrie in Treatment: 9 Debridement Performed for Wound #1 Left,Posterior Lower Leg Assessment: Performed By: Physician Kalman Shan, MD Debridement Type: Debridement Severity of Tissue Pre Debridement: Fat layer exposed Level of Consciousness (Pre- Awake and Alert procedure): Pre-procedure Verification/Time Out Yes - 15:39 Taken: Start Time: 15:39 Pain Control: Lidocaine Total Area Debrided (L x W): 2 (cm) x 2 (cm) = 4 (cm) Tissue and other material Viable, Non-Viable, Eschar, Slough, Subcutaneous, Slough debrided: Level: Skin/Subcutaneous Tissue Debridement Description: Excisional Instrument: Curette Bleeding: Moderate Hemostasis Achieved: Pressure Response to Treatment: Procedure was tolerated well Level of Consciousness (Post- Awake and Alert procedure): Post  Debridement Measurements of Total Wound Length: (cm) 5.5 Width: (cm) 6.3 Depth: (cm) 0.2 Volume: (cm) 5.443 Character of Wound/Ulcer Post Debridement: Improved Severity of Tissue Post Debridement: Fat layer exposed Post Procedure Diagnosis Same as Pre-procedure Electronic Signature(s) Signed: 07/22/2020 3:51:01 PM By: Kalman Shan DO Signed: 07/23/2020 10:03:03 AM By: Donnamarie Poag Entered By: Donnamarie Poag on 07/22/2020 15:39:28 Shane Bean ([id number]) -------------------------------------------------------------------------------- HPI Details Patient Name: Shane Bean Date of Service: 07/22/2020 3:00 PM Medical Record Number: [id number] Patient Account Number: [id number] Date of Birth/Sex: 15-Jul-1955 (65 y.o. M) Treating RN: Cornell Barman Primary Care Provider: Reeves Dam Other Clinician: Referring Provider: Reeves Dam Treating Provider/Extender: Yaakov Guthrie in Treatment: 9 History of Present Illness HPI Description: 05/18/2020 upon evaluation today patient appears for initial evaluation here in clinic concerning issues that he has been having with the wound on his left lateral leg. Fortunately there does not appear to be any signs of obvious an active infection at this time which is great news. With that being said the patient unfortunately is continued to have issues with pain although he tells me it is gotten a lot better. He was on Keflex as well as Bactrim DS which seems to have done a good job there. His most recent hemoglobin A1c was 9.8 that was on 04/14/2020. Subsequently I do feel like that the patient is making progress here. He does have a history of chronic venous insufficiency as well as hypertension. I do not see any need for antibiotics at this point. 5/25; follow-up of the wound on the left posterior calf. This is completely necrotic on the surface. It does not look infected but it is painful. He is a diabetic but I think there is some  suggestion here of chronic venous disease as well. We used Iodoflex under compression last week 6/1; again a completely necrotic surface on this with the wound. We have been using Iodoflex under compression he complains of gnawing pain. He has had wounds previously a lot of this looks like chronic venous disease. 6/8; about two thirds of the  surface of this wound is still covered in a black necrotic eschar. I changed him from Iodoflex to [place] last week because of complaints of pain. He actually was seen by her neurologist this weeko Peripheral neuropathy as a cause of the pain and they changed him from Neurontin to Lyrica but he still has not had any relief. He says that most of the pain however is in his heel not in his wound per se. He is a diabetic 6/15. This is a patient with what looks to be a venous wound on the left lateral lower leg. He is also a diabetic. Currently being worked up for diabetic neuropathy. He complains of pain out of proportion to the size of the wound although to be fair the entire area here was eschared. We have been working to get a viable surface. We are using Sorbact 6/22; fairly painful wound on the left posterior calf. I am assuming this is been venous he is also a diabetic. His ABI in our clinic was noncompressible however he has easily palpable pulses on his feet. He complains of a lot of pain in the wound also of the left heel and the upper left calf. Some of this may be neuropathy. It is led me to discontinue Iodoflex reduce his compression but he still complains of pain in fact he says he could not take a debridement today. When I first saw this it was completely necrotic surface we have got it down to something that looks a reasonably healthy I have been wondering about biopsying this area however he is on Coumadin and with the pain I have put this off. The major question would be an inflammatory ulcer such as pyoderma. The patient is not aware of how this  started. He does however have a wound history on both legs. 6/29; patient presents for 1 week follow-up. He has been using Hydrofera Blue under Kerlix/Coban. He has tolerated this well. He currently denies signs of infection. He also declines debridement today. He he states it is painful and does not want to have this done. 7/6; Patient presents for 1 week follow-up. He has been using Hydrofera Blue under Kerlix/Coban. He denies signs of infection. He declines debridement today. 7/15; patient presents for 1 week follow-up. He has been using Hydrofera Blue under Kerlix/Coban. He denies signs of infection today. He is agreeable to having debridement done today. 7/20; patient presents for 1 week follow-up. He is in good spirits today. He has been using Hydrofera Blue under Kerlix/Coban. He denies signs of infection today. Electronic Signature(s) Signed: 07/22/2020 3:51:01 PM By: Kalman Shan DO Entered By: Kalman Shan on 07/22/2020 15:47:19 Shane Bean ([id number]) -------------------------------------------------------------------------------- Physical Exam Details Patient Name: Shane Bean Date of Service: 07/22/2020 3:00 PM Medical Record Number: [id number] Patient Account Number: [id number] Date of Birth/Sex: July 23, 1955 (64 y.o. M) Treating RN: Cornell Barman Primary Care Provider: Reeves Dam Other Clinician: Referring Provider: Reeves Dam Treating Provider/Extender: Yaakov Guthrie in Treatment: 9 Constitutional . Cardiovascular . Psychiatric . Notes Left lower extremity: Open wound to the posterior aspect. There is some granulation tissue and nonviable tissue present. No obvious signs of infection. Electronic Signature(s) Signed: 07/22/2020 3:51:01 PM By: Kalman Shan DO Entered By: Kalman Shan on 07/22/2020 15:48:00 Shane Bean ([id number]) -------------------------------------------------------------------------------- Physician  Orders Details Patient Name: Shane Bean Date of Service: 07/22/2020 3:00 PM Medical Record Number: [id number] Patient Account Number: [id number] Date of Birth/Sex: [birth year] (64 y.o. M) Treating RN: Donnamarie Poag Primary Care  Provider: Reeves Dam Other Clinician: Referring Provider: Reeves Dam Treating Provider/Extender: Yaakov Guthrie in Treatment: 9 Verbal / Phone Orders: No Diagnosis Coding Follow-up Appointments o Return Appointment in 1 week. o Nurse Visit as needed Bathing/ Shower/ Hygiene o May shower with wound dressing protected with water repellent cover or cast protector. - cover dressing during shower-do not get it wet Edema Control - Lymphedema / Segmental Compressive Device / Other o Elevate, Exercise Daily and Avoid Standing for Long Periods of Time. o Elevate legs to the level of the heart and pump ankles as often as possible o Elevate leg(s) parallel to the floor when sitting. Wound Treatment Wound #1 - Lower Leg Wound Laterality: Left, Posterior Cleanser: Soap and Water 1 x Per Week/30 Days Discharge Instructions: Gently cleanse wound with antibacterial soap, rinse and pat dry prior to dressing wounds Peri-Wound Care: Triamcinolone Acetonide Cream, 0.1%, 15 (g) tube 1 x Per Week/30 Days Discharge Instructions: peri wound Topical: Santyl Collagenase Ointment, 30 (gm), tube 1 x Per Week/30 Days Discharge Instructions: Apply to wound in clinic only. Primary Dressing: Hydrofera Blue Ready Transfer Foam, 2.5x2.5 (in/in) 1 x Per Week/30 Days Discharge Instructions: Apply Hydrofera Blue Ready to wound bed as directed Secondary Dressing: ABD Pad 5x9 (in/in) 1 x Per Week/30 Days Discharge Instructions: Cover with ABD pad Secured With: Coban Cohesive Bandage 4x5 (yds) Stretched 1 x Per Week/30 Days Discharge Instructions: Apply Coban from base of toes to 3-finger-widths below knee Secured With: Kerlix Roll Sterile or Non-Sterile 6-ply 4.5x4  (yd/yd) 1 x Per Week/30 Days Discharge Instructions: Apply Kerlix from base of toes to 3-finger-widths below knee Consults o Vascular - Referral will be sent to Vascular and they will call you to set up the appointment Electronic Signature(s) Signed: 07/22/2020 3:51:01 PM By: Kalman Shan DO Signed: 07/23/2020 10:03:03 AM By: Donnamarie Poag Entered By: Donnamarie Poag on 07/22/2020 15:40:40 Shane Bean ([id number]) -------------------------------------------------------------------------------- Problem List Details Patient Name: Shane Bean Date of Service: 07/22/2020 3:00 PM Medical Record Number: [id number] Patient Account Number: [id number] Date of Birth/Sex: 22-Nov-1955 (64 y.o. M) Treating RN: Cornell Barman Primary Care Provider: Reeves Dam Other Clinician: Referring Provider: Reeves Dam Treating Provider/Extender: Yaakov Guthrie in Treatment: 9 Active Problems ICD-10 Encounter Code Description Active Date MDM Diagnosis E11.622 Type 2 diabetes mellitus with other skin ulcer 05/18/2020 No Yes I87.2 Venous insufficiency (chronic) (peripheral) 05/18/2020 No Yes 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 layer 05/18/2020 No Yes exposed [place] (primary) hypertension 05/18/2020 No Yes Inactive Problems Resolved Problems Electronic Signature(s) Signed: 07/22/2020 3:51:01 PM By: Kalman Shan DO Entered By: Kalman Shan on 07/22/2020 15:44:23 Shane Bean ([id number]) -------------------------------------------------------------------------------- Progress Note Details Patient Name: Shane Bean Date of Service: 07/22/2020 3:00 PM Medical Record Number: [id number] Patient Account Number: [id number] Date of Birth/Sex: 25-Oct-1955 (64 y.o. M) Treating RN: Cornell Barman Primary Care Provider: Reeves Dam Other Clinician: Referring Provider: Reeves Dam Treating Provider/Extender: Yaakov Guthrie in  Treatment: 9 Subjective Chief Complaint Information obtained from Patient Left LE Ulcer History of Present Illness (HPI) 05/18/2020 upon evaluation today patient appears for initial evaluation here in clinic concerning issues that he has been having with the wound on his left lateral leg. Fortunately there does not appear to be any signs of obvious an active infection at this time which is great news. With that being said the patient unfortunately is continued to have issues with pain although he tells me it is gotten a lot  better. He was on Keflex as well as Bactrim DS which seems to have done a good job there. His most recent hemoglobin A1c was 9.8 that was on 04/14/2020. Subsequently I do feel like that the patient is making progress here. He does have a history of chronic venous insufficiency as well as hypertension. I do not see any need for antibiotics at this point. 5/25; follow-up of the wound on the left posterior calf. This is completely necrotic on the surface. It does not look infected but it is painful. He is a diabetic but I think there is some suggestion here of chronic venous disease as well. We used Iodoflex under compression last week 6/1; again a completely necrotic surface on this with the wound. We have been using Iodoflex under compression he complains of gnawing pain. He has had wounds previously a lot of this looks like chronic venous disease. 6/8; about two thirds of the surface of this wound is still covered in a black necrotic eschar. I changed him from Iodoflex to [place] last week because of complaints of pain. He actually was seen by her neurologist this weeko Peripheral neuropathy as a cause of the pain and they changed him from Neurontin to Lyrica but he still has not had any relief. He says that most of the pain however is in his heel not in his wound per se. He is a diabetic 6/15. This is a patient with what looks to be a venous wound on the left lateral lower leg.  He is also a diabetic. Currently being worked up for diabetic neuropathy. He complains of pain out of proportion to the size of the wound although to be fair the entire area here was eschared. We have been working to get a viable surface. We are using Sorbact 6/22; fairly painful wound on the left posterior calf. I am assuming this is been venous he is also a diabetic. His ABI in our clinic was noncompressible however he has easily palpable pulses on his feet. He complains of a lot of pain in the wound also of the left heel and the upper left calf. Some of this may be neuropathy. It is led me to discontinue Iodoflex reduce his compression but he still complains of pain in fact he says he could not take a debridement today. When I first saw this it was completely necrotic surface we have got it down to something that looks a reasonably healthy I have been wondering about biopsying this area however he is on Coumadin and with the pain I have put this off. The major question would be an inflammatory ulcer such as pyoderma. The patient is not aware of how this started. He does however have a wound history on both legs. 6/29; patient presents for 1 week follow-up. He has been using Hydrofera Blue under Kerlix/Coban. He has tolerated this well. He currently denies signs of infection. He also declines debridement today. He he states it is painful and does not want to have this done. 7/6; Patient presents for 1 week follow-up. He has been using Hydrofera Blue under Kerlix/Coban. He denies signs of infection. He declines debridement today. 7/15; patient presents for 1 week follow-up. He has been using Hydrofera Blue under Kerlix/Coban. He denies signs of infection today. He is agreeable to having debridement done today. 7/20; patient presents for 1 week follow-up. He is in good spirits today. He has been using Hydrofera Blue under Kerlix/Coban. He denies signs of infection today. Patient  History Information obtained from Patient. Social History Never smoker, Marital Status - Single, Alcohol Use - Moderate, Drug Use - No History, Caffeine Use - Never. Medical History Eyes Denies history of Cataracts, Glaucoma, Optic Neuritis Ear/Nose/Mouth/Throat Denies history of Chronic sinus problems/congestion Hematologic/Lymphatic Denies history of Anemia, Hemophilia, Human Immunodeficiency Virus, Lymphedema Respiratory Denies history of Aspiration, Asthma, Chronic Obstructive Pulmonary Disease (COPD), Pneumothorax, Sleep Apnea, Tuberculosis Cardiovascular Patient has history of Hypertension Denies history of Angina, Arrhythmia, Congestive Heart Failure, Coronary Artery Disease, Deep Vein Thrombosis, Hypotension, Myocardial Infarction, Peripheral Arterial Disease, Peripheral Venous Disease, Phlebitis, Vasculitis Gade, Reynald V. ([id number]) Gastrointestinal Denies history of Cirrhosis , Colitis, Crohn s, Hepatitis A, Hepatitis B Endocrine Patient has history of Type II Diabetes Denies history of Type I Diabetes Genitourinary Denies history of End Stage Renal Disease Immunological Denies history of Lupus Erythematosus, Raynaud s, Scleroderma Integumentary (Skin) Denies history of History of Burn, History of pressure wounds Musculoskeletal Denies history of Gout, Rheumatoid Arthritis, Osteoarthritis, Osteomyelitis Neurologic Patient has history of Neuropathy Denies history of Dementia, Quadriplegia, Paraplegia, Seizure Disorder Oncologic Denies history of Received Chemotherapy, Received Radiation Psychiatric Denies history of Anorexia/bulimia, Confinement Anxiety Objective Constitutional Vitals Time Taken: 3:13 PM, Height: 70 in, Weight: 290 lbs, BMI: 41.6, Temperature: 98.5 F, Pulse: 93 bpm, Respiratory Rate: 16 breaths/min, Blood Pressure: 143/80 mmHg. General Notes: Left lower extremity: Open wound to the posterior aspect. There is some granulation tissue and  nonviable tissue present. No obvious signs of infection. Integumentary (Hair, Skin) Wound #1 status is Open. Original cause of wound was Gradually Appeared. The date acquired was: 04/08/2020. The wound has been in treatment 9 weeks. The wound is located on the Left,Posterior Lower Leg. The wound measures 5.5cm length x 6.3cm width x 0.2cm depth; 27.214cm^2 area and 5.443cm^3 volume. There is Fat Layer (Subcutaneous Tissue) exposed. There is no tunneling or undermining noted. There is a large amount of serosanguineous drainage noted. There is medium (34-66%) red, pink granulation within the wound bed. There is a medium (34-66%) amount of necrotic tissue within the wound bed including Eschar and Adherent Slough. Assessment Active Problems ICD-10 Type 2 diabetes mellitus with other skin ulcer Venous insufficiency (chronic) (peripheral) Non-pressure chronic ulcer of other part of left lower leg with fat layer exposed Essential (primary) hypertension Patient's wound has shown improvement in size and appearance since last clinic visit. I think this is due to being able to debride the wound. He is agreeable to having this done again in clinic. I was able to debride some nonviable tissue. No signs of infection on exam. I recommended continuing with Hydrofera Blue, Santyl under Kerlix/Coban. His ABIs were 1.4 on the right and I will obtain formal ABIs. Procedures Wound #1 SINCLAIR, ALLIGOOD ([id number]) Pre-procedure diagnosis of Wound #1 is a Diabetic Wound/Ulcer of the Lower Extremity located on the Left,Posterior Lower Leg .Severity of Tissue Pre Debridement is: Fat layer exposed. There was a Excisional Skin/Subcutaneous Tissue Debridement with a total area of 4 sq cm performed by Kalman Shan, MD. With the following instrument(s): Curette to remove Viable and Non-Viable tissue/material. Material removed includes Eschar, Subcutaneous Tissue, and Slough after achieving pain control using  Lidocaine. A time out was conducted at 15:39, prior to the start of the procedure. A Moderate amount of bleeding was controlled with Pressure. The procedure was tolerated well. Post Debridement Measurements: 5.5cm length x 6.3cm width x 0.2cm depth; 5.443cm^3 volume. Character of Wound/Ulcer Post Debridement is improved. Severity of Tissue Post Debridement is: Fat layer exposed. Post  procedure Diagnosis Wound #1: Same as Pre-Procedure Plan Follow-up Appointments: Return Appointment in 1 week. Nurse Visit as needed Bathing/ Shower/ Hygiene: May shower with wound dressing protected with water repellent cover or cast protector. - cover dressing during shower-do not get it wet Edema Control - Lymphedema / Segmental Compressive Device / Other: Elevate, Exercise Daily and Avoid Standing for Long Periods of Time. Elevate legs to the level of the heart and pump ankles as often as possible Elevate leg(s) parallel to the floor when sitting. Consults ordered were: Vascular - Referral will be sent to Vascular and they will call you to set up the appointment WOUND #1: - Lower Leg Wound Laterality: Left, Posterior Cleanser: Soap and Water 1 x Per Week/30 Days Discharge Instructions: Gently cleanse wound with antibacterial soap, rinse and pat dry prior to dressing wounds Peri-Wound Care: Triamcinolone Acetonide Cream, 0.1%, 15 (g) tube 1 x Per Week/30 Days Discharge Instructions: peri wound Topical: Santyl Collagenase Ointment, 30 (gm), tube 1 x Per Week/30 Days Discharge Instructions: Apply to wound in clinic only. Primary Dressing: Hydrofera Blue Ready Transfer Foam, 2.5x2.5 (in/in) 1 x Per Week/30 Days Discharge Instructions: Apply Hydrofera Blue Ready to wound bed as directed Secondary Dressing: ABD Pad 5x9 (in/in) 1 x Per Week/30 Days Discharge Instructions: Cover with ABD pad Secured With: Coban Cohesive Bandage 4x5 (yds) Stretched 1 x Per Week/30 Days Discharge Instructions: Apply Coban from  base of toes to 3-finger-widths below knee Secured With: Kerlix Roll Sterile or Non-Sterile 6-ply 4.5x4 (yd/yd) 1 x Per Week/30 Days Discharge Instructions: Apply Kerlix from base of toes to 3-finger-widths below knee 1. In office sharp debridement 2. Continue Hydrofera Blue, Santyl under Kerlix/Coban 3. ABIs Electronic Signature(s) Signed: 07/22/2020 3:51:01 PM By: Kalman Shan DO Entered By: Kalman Shan on 07/22/2020 15:50:19 Shane Bean ([id number]) -------------------------------------------------------------------------------- ROS/PFSH Details Patient Name: Shane Bean Date of Service: 07/22/2020 3:00 PM Medical Record Number: [id number] Patient Account Number: [id number] Date of Birth/Sex: 1955-04-11 (64 y.o. M) Treating RN: Cornell Barman Primary Care Provider: Reeves Dam Other Clinician: Referring Provider: Reeves Dam Treating Provider/Extender: Yaakov Guthrie in Treatment: 9 Information Obtained From Patient Eyes Medical History: Negative for: Cataracts; Glaucoma; Optic Neuritis Ear/Nose/Mouth/Throat Medical History: Negative for: Chronic sinus problems/congestion Hematologic/Lymphatic Medical History: Negative for: Anemia; Hemophilia; Human Immunodeficiency Virus; Lymphedema Respiratory Medical History: Negative for: Aspiration; Asthma; Chronic Obstructive Pulmonary Disease (COPD); Pneumothorax; Sleep Apnea; Tuberculosis Cardiovascular Medical History: Positive for: Hypertension Negative for: Angina; Arrhythmia; Congestive Heart Failure; Coronary Artery Disease; Deep Vein Thrombosis; Hypotension; Myocardial Infarction; Peripheral Arterial Disease; Peripheral Venous Disease; Phlebitis; Vasculitis Gastrointestinal Medical History: Negative for: Cirrhosis ; Colitis; Crohnos; Hepatitis A; Hepatitis B Endocrine Medical History: Positive for: Type II Diabetes Negative for: Type I Diabetes Time with diabetes: 3 Treated with: Oral  agents Blood sugar tested every day: No Genitourinary Medical History: Negative for: End Stage Renal Disease Immunological Medical History: Negative for: Lupus Erythematosus; Raynaudos; Scleroderma Integumentary (Skin) Medical History: Negative for: History of Burn; History of pressure wounds Sherburn, Al V. ([id number]) Musculoskeletal Medical History: Negative for: Gout; Rheumatoid Arthritis; Osteoarthritis; Osteomyelitis Neurologic Medical History: Positive for: Neuropathy Negative for: Dementia; Quadriplegia; Paraplegia; Seizure Disorder Oncologic Medical History: Negative for: Received Chemotherapy; Received Radiation Psychiatric Medical History: Negative for: Anorexia/bulimia; Confinement Anxiety Immunizations Pneumococcal Vaccine: Received Pneumococcal Vaccination: Yes Implantable Devices None Family and Social History Never smoker; Marital Status - Single; Alcohol Use: Moderate; Drug Use: No History; Caffeine Use: Never; Financial Concerns: No; Food, Clothing or Shelter Needs: No; Support System Lacking: No;  Transportation Concerns: No Electronic Signature(s) Signed: 07/22/2020 3:51:01 PM By: Kalman Shan DO Signed: 07/23/2020 11:45:43 AM By: Gretta Cool, BSN, RN, CWS, Kim RN, BSN Entered By: Kalman Shan on 07/22/2020 15:47:26 Shane Bean ([id number]) -------------------------------------------------------------------------------- [place] Details Patient Name: Shane Bean Date of Service: 07/22/2020 Medical Record Number: [id number] Patient Account Number: [id number] Date of Birth/Sex: November 12, 1955 (64 y.o. M) Treating RN: Donnamarie Poag Primary Care Provider: Reeves Dam Other Clinician: Referring Provider: Reeves Dam Treating Provider/Extender: Yaakov Guthrie in Treatment: 9 Diagnosis Coding ICD-10 Codes Code Description E11.622 Type 2 diabetes mellitus with other skin ulcer I87.2 Venous insufficiency (chronic)  (peripheral) 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 layer exposed I10 Essential (primary) hypertension Facility Procedures CPT4 Code: 82800349 Description: 17915 - DEB SUBQ TISSUE 20 SQ CM/< Modifier: Quantity: 1 CPT4 Code: Description: ICD-10 Diagnosis Description 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 layer Modifier: exposed Quantity: Physician Procedures CPT4 Code: 0569794 Description: 11042 - WC PHYS SUBQ TISS 20 SQ CM Modifier: Quantity: 1 CPT4 Code: Description: ICD-10 Diagnosis Description 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 layer Modifier: exposed Quantity: Electronic Signature(s) Signed: 07/22/2020 3:51:01 PM By: Kalman Shan DO Entered By: Kalman Shan on 07/22/2020 15:50:38

## 2020-07-24 ENCOUNTER — Other Ambulatory Visit (INDEPENDENT_AMBULATORY_CARE_PROVIDER_SITE_OTHER): Payer: Self-pay | Admitting: Internal Medicine

## 2020-07-24 DIAGNOSIS — I872 Venous insufficiency (chronic) (peripheral): Secondary | ICD-10-CM

## 2020-07-24 DIAGNOSIS — L97822 Non-pressure chronic ulcer of other part of left lower leg with fat layer exposed: Secondary | ICD-10-CM

## 2020-07-29 ENCOUNTER — Encounter (HOSPITAL_BASED_OUTPATIENT_CLINIC_OR_DEPARTMENT_OTHER): Payer: Medicare (Managed Care) | Admitting: Internal Medicine

## 2020-07-29 ENCOUNTER — Other Ambulatory Visit: Payer: Self-pay

## 2020-07-29 DIAGNOSIS — E11622 Type 2 diabetes mellitus with other skin ulcer: Secondary | ICD-10-CM | POA: Diagnosis not present

## 2020-07-29 DIAGNOSIS — L97822 Non-pressure chronic ulcer of other part of left lower leg with fat layer exposed: Secondary | ICD-10-CM

## 2020-07-29 NOTE — Progress Notes (Addendum)
Shane Bean ([id number]) Visit Report for 07/29/2020 Arrival Information Details Patient Name: Shane Bean, Shane Bean Date of Service: 07/29/2020 3:00 PM Medical Record Number: [id number] Patient Account Number: [id number] Date of Birth/Sex: 05-16-1955 (64 y.o. M) Treating RN: Donnamarie Poag Primary Care Ellis Koffler: Reeves Dam Other Clinician: Referring Wm Sahagun: Reeves Dam Treating Nevia Henkin/Extender: Yaakov Guthrie in Treatment: 10 Visit Information History Since Last Visit Added or deleted any medications: No Patient Arrived: Ambulatory Had a fall or experienced change in No Arrival Time: 15:01 activities of daily living that may affect Accompanied By: self risk of falls: Transfer Assistance: None Hospitalized since last visit: No Patient Identification Verified: Yes Has Dressing in Place as Prescribed: Yes Secondary Verification Process Completed: Yes Has Compression in Place as Prescribed: Yes Patient Requires Transmission-Based No Pain Present Now: Yes Precautions: Patient Has Alerts: Yes Patient Alerts: Patient on Blood Thinner DIABETIC Electronic Signature(s) Signed: 07/29/2020 3:37:08 PM By: Donnamarie Poag Entered By: Donnamarie Poag on 07/29/2020 15:04:33 Shane Bean ([id number]) -------------------------------------------------------------------------------- Clinic Level of Care Assessment Details Patient Name: Shane Bean Date of Service: 07/29/2020 3:00 PM Medical Record Number: [id number] Patient Account Number: [id number] Date of Birth/Sex: Jul 13, 1955 (64 y.o. M) Treating RN: Donnamarie Poag Primary Care Jalyn Rosero: Reeves Dam Other Clinician: Referring Giuliano Preece: Reeves Dam Treating Manly Nestle/Extender: Yaakov Guthrie in Treatment: 10 Clinic Level of Care Assessment Items TOOL 1 Quantity Score []  - Use when EandM and Procedure is performed on INITIAL visit 0 ASSESSMENTS - Nursing Assessment / Reassessment []  - General Physical Exam  (combine w/ comprehensive assessment (listed just below) when performed on new 0 pt. evals) []  - 0 Comprehensive Assessment (HX, ROS, Risk Assessments, Wounds Hx, etc.) ASSESSMENTS - Wound and Skin Assessment / Reassessment []  - Dermatologic / Skin Assessment (not related to wound area) 0 ASSESSMENTS - Ostomy and/or Continence Assessment and Care []  - Incontinence Assessment and Management 0 []  - 0 Ostomy Care Assessment and Management (repouching, etc.) PROCESS - Coordination of Care []  - Simple Patient / Family Education for ongoing care 0 []  - 0 Complex (extensive) Patient / Family Education for ongoing care []  - 0 Staff obtains Programmer, systems, Records, Test Results / Process Orders []  - 0 Staff telephones HHA, Nursing Homes / Clarify orders / etc []  - 0 Routine Transfer to another Facility (non-emergent condition) []  - 0 Routine Hospital Admission (non-emergent condition) []  - 0 New Admissions / Biomedical engineer / Ordering NPWT, Apligraf, etc. []  - 0 Emergency Hospital Admission (emergent condition) PROCESS - Special Needs []  - Pediatric / Minor Patient Management 0 []  - 0 Isolation Patient Management []  - 0 Hearing / Language / Visual special needs []  - 0 Assessment of Community assistance (transportation, D/C planning, etc.) []  - 0 Additional assistance / Altered mentation []  - 0 Support Surface(s) Assessment (bed, cushion, seat, etc.) INTERVENTIONS - Miscellaneous []  - External ear exam 0 []  - 0 Patient Transfer (multiple staff / Civil Service fast streamer / Similar devices) []  - 0 Simple Staple / Suture removal (25 or less) []  - 0 Complex Staple / Suture removal (26 or more) []  - 0 Hypo/Hyperglycemic Management (do not check if billed separately) []  - 0 Ankle / Brachial Index (ABI) - do not check if billed separately Has the patient been seen at the hospital within the last three years: Yes Total Score: 0 Level Of Care: ____ Shane Bean ([id number]) Electronic  Signature(s) Signed: 07/31/2020 1:22:28 PM By: Donnamarie Poag Entered By: Donnamarie Poag on 07/29/2020 [place], [place]. ([id number]) -------------------------------------------------------------------------------- Encounter Discharge  Information Details Patient Name: Shane Bean, Shane Bean Date of Service: 07/29/2020 3:00 PM Medical Record Number: [id number] Patient Account Number: [id number] Date of Birth/Sex: 1955/04/24 (64 y.o. M) Treating RN: Donnamarie Poag Primary Care Jakhari Space: Reeves Dam Other Clinician: Referring Thalya Fouche: Reeves Dam Treating Vasilisa Vore/Extender: Yaakov Guthrie in Treatment: 10 Encounter Discharge Information Items Post Procedure Vitals Discharge Condition: Stable Temperature (F): 98.7 Ambulatory Status: Ambulatory Pulse (bpm): 71 Discharge Destination: Home Respiratory Rate (breaths/min): 16 Transportation: Private Auto Blood Pressure (mmHg): 130/71 Accompanied By: self Schedule Follow-up Appointment: Yes Clinical Summary of Care: Electronic Signature(s) Signed: 07/31/2020 1:22:28 PM By: Donnamarie Poag Entered By: Donnamarie Poag on 07/29/2020 16:10:34 Shane Bean ([id number]) -------------------------------------------------------------------------------- Lower Extremity Assessment Details Patient Name: Shane Bean Date of Service: 07/29/2020 3:00 PM Medical Record Number: [id number] Patient Account Number: [id number] Date of Birth/Sex: [birth year] (64 y.o. M) Treating RN: Donnamarie Poag Primary Care Garnet Overfield: Reeves Dam Other Clinician: Referring Elianie Hubers: Reeves Dam Treating Elane Peabody/Extender: Yaakov Guthrie in Treatment: 10 Edema Assessment Assessed: Shirlyn Goltz: Yes] Patrice Paradise: No] [Left: Edema] [Right: :] Calf Left: Right: Point of Measurement: 34 cm From Medial Instep 43 cm Ankle Left: Right: Point of Measurement: 10 cm From Medial Instep 24.5 cm Vascular Assessment Pulses: Dorsalis Pedis Palpable:  [Left:Yes] Electronic Signature(s) Signed: 07/29/2020 3:37:08 PM By: Donnamarie Poag Entered By: Donnamarie Poag on 07/29/2020 15:10:55 Shane Bean ([id number]) -------------------------------------------------------------------------------- Multi Wound Chart Details Patient Name: Shane Bean Date of Service: 07/29/2020 3:00 PM Medical Record Number: [id number] Patient Account Number: [id number] Date of Birth/Sex: [birth year] (64 y.o. M) Treating RN: Donnamarie Poag Primary Care [place] Zou: Reeves Dam Other Clinician: Referring Ashlan Dignan: Reeves Dam Treating Obaloluwa Delatte/Extender: Yaakov Guthrie in Treatment: 10 Vital Signs Height(in): 70 Pulse(bpm): 38 Weight(lbs): 290 Blood Pressure(mmHg): 130/71 Body Mass Index(BMI): 42 Temperature(F): 98.7 Respiratory Rate(breaths/min): 16 Photos: [N/A:N/A] Wound Location: Left, Posterior Lower Leg N/A N/A Wounding Event: Gradually Appeared N/A N/A Primary Etiology: Diabetic Wound/Ulcer of the Lower N/A N/A Extremity Comorbid History: Hypertension, Type II Diabetes, N/A N/A Neuropathy Date Acquired: 04/08/2020 N/A N/A Weeks of Treatment: 10 N/A N/A Wound Status: Open N/A N/A Measurements L x W x D (cm) 5.5x4.5x0.2 N/A N/A Area (cm) : 19.439 N/A N/A Volume (cm) : 3.888 N/A N/A % Reduction in Area: -76.80% N/A N/A % Reduction in Volume: -253.50% N/A N/A Classification: Grade 2 N/A N/A Exudate Amount: Large N/A N/A Exudate Type: Serosanguineous N/A N/A Exudate Color: red, brown N/A N/A Granulation Amount: Medium (34-66%) N/A N/A Granulation Quality: Red, Pink N/A N/A Necrotic Amount: Medium (34-66%) N/A N/A Necrotic Tissue: Eschar, Adherent Slough N/A N/A Exposed Structures: Fat Layer (Subcutaneous Tissue): N/A N/A Yes Fascia: No Tendon: No Muscle: No Joint: No Bone: No Epithelialization: Small (1-33%) N/A N/A Debridement: Debridement - Excisional N/A N/A Pre-procedure Verification/Time 15:58 N/A N/A Out  Taken: Tissue Debrided: Subcutaneous, Slough N/A N/A Level: Skin/Subcutaneous Tissue N/A N/A Debridement Area (sq cm): 4 N/A N/A Instrument: Curette N/A N/A Bleeding: Minimum N/A N/A Hemostasis Achieved: Pressure N/A N/A Debridement Treatment Procedure was tolerated well N/A N/A Response: Post Debridement 5.5x4.5x0.2 N/A N/A Measurements L x W x D (cm) Shane Bean, Shane Bean ([id number]) Post Debridement Volume: 3.888 N/A N/A (cm) Procedures Performed: Debridement N/A N/A Treatment Notes Wound #1 (Lower Leg) Wound Laterality: Left, Posterior Cleanser Soap and Water Discharge Instruction: Gently cleanse wound with antibacterial soap, rinse and pat dry prior to dressing wounds Peri-Wound Care Triamcinolone Acetonide Cream, 0.1%, 15 (g) tube Discharge Instruction: peri wound Topical Santyl Collagenase Ointment, 30 (gm), tube Discharge Instruction: Apply  to wound in clinic only. Primary Dressing Hydrofera Blue Ready Transfer Foam, 2.5x2.5 (in/in) Discharge Instruction: Apply Hydrofera Blue Ready to wound bed as directed Secondary Dressing ABD Pad 5x9 (in/in) Discharge Instruction: Cover with ABD pad Secured With Coban Cohesive Bandage 4x5 (yds) Stretched Discharge Instruction: Apply Coban from base of toes to 3-finger-widths below knee Kerlix Roll Sterile or Non-Sterile 6-ply 4.5x4 (yd/yd) Discharge Instruction: Apply Kerlix from base of toes to 3-finger-widths below knee Compression Wrap Compression Stockings Add-Ons Electronic Signature(s) Signed: 07/29/2020 4:55:13 PM By: Kalman Shan DO Previous Signature: 07/29/2020 3:37:08 PM Version By: Donnamarie Poag Entered By: Kalman Shan on 07/29/2020 16:51:03 Shane Bean ([id number]) -------------------------------------------------------------------------------- [place] Details Patient Name: Shane Bean Date of Service: 07/29/2020 3:00 PM Medical Record Number: [id number] Patient Account  Number: [id number] Date of Birth/Sex: 07/10/1955 (64 y.o. M) Treating RN: Donnamarie Poag Primary Care Lucien Budney: Reeves Dam Other Clinician: Referring Mariamawit Depaoli: Reeves Dam Treating Karisa Nesser/Extender: Yaakov Guthrie in Treatment: 10 Active Inactive Wound/Skin Impairment Nursing Diagnoses: Impaired tissue integrity Knowledge deficit related to smoking impact on wound healing Knowledge deficit related to ulceration/compromised skin integrity Goals: Patient/caregiver will verbalize understanding of skin care regimen Date Initiated: 05/18/2020 Date Inactivated: 07/08/2020 Target Resolution Date: 06/19/2020 Goal Status: Met Ulcer/skin breakdown will have a volume reduction of 30% by week 4 Date Initiated: 05/18/2020 Date Inactivated: 07/08/2020 Target Resolution Date: 06/15/2020 Goal Status: Met Ulcer/skin breakdown will have a volume reduction of 50% by week 8 Date Initiated: 05/18/2020 Date Inactivated: 07/15/2020 Target Resolution Date: 07/13/2020 Goal Status: Unmet Unmet Reason: con't tx Ulcer/skin breakdown will have a volume reduction of 80% by week 12 Date Initiated: 05/18/2020 Target Resolution Date: 08/10/2020 Goal Status: Active Interventions: Assess patient/caregiver ability to obtain necessary supplies Assess patient/caregiver ability to perform ulcer/skin care regimen upon admission and as needed Assess ulceration(s) every visit Notes: Electronic Signature(s) Signed: 07/29/2020 3:37:08 PM By: Donnamarie Poag Entered By: Donnamarie Poag on 07/29/2020 15:12:18 Shane Bean ([id number]) -------------------------------------------------------------------------------- Pain Assessment Details Patient Name: Shane Bean Date of Service: 07/29/2020 3:00 PM Medical Record Number: [id number] Patient Account Number: [id number] Date of Birth/Sex: 11/05/1955 (64 y.o. M) Treating RN: Donnamarie Poag Primary Care Olanda Downie: Reeves Dam Other Clinician: Referring Giovannina Mun: Reeves Dam Treating Amadou Katzenstein/Extender: Yaakov Guthrie in Treatment: 10 Active Problems Location of Pain Severity and Description of Pain Patient Has Paino Yes Site Locations Pain Location: Pain in Ulcers Rate the pain. Current Pain Level: 8 Pain Management and Medication Current Pain Management: Electronic Signature(s) Signed: 07/29/2020 3:37:08 PM By: Donnamarie Poag Entered By: Donnamarie Poag on 07/29/2020 15:04:57 Shane Bean ([id number]) -------------------------------------------------------------------------------- Patient/Caregiver Education Details Patient Name: Shane Bean Date of Service: 07/29/2020 3:00 PM Medical Record Number: [id number] Patient Account Number: [id number] Date of Birth/Gender: 1955/04/02 (64 y.o. M) Treating RN: Donnamarie Poag Primary Care Physician: Reeves Dam Other Clinician: Referring Physician: Reeves Dam Treating Physician/Extender: Yaakov Guthrie in Treatment: 10 Education Assessment Education Provided To: Patient Education Topics Provided Basic Hygiene: Wound/Skin Impairment: Electronic Signature(s) Signed: 07/29/2020 3:37:08 PM By: Donnamarie Poag Entered By: Donnamarie Poag on 07/29/2020 15:13:03 Shane Bean ([id number]) -------------------------------------------------------------------------------- Wound Assessment Details Patient Name: Shane Bean Date of Service: 07/29/2020 3:00 PM Medical Record Number: [id number] Patient Account Number: [id number] Date of Birth/Sex: 1955-04-01 (64 y.o. M) Treating RN: Donnamarie Poag Primary Care Arzu Mcgaughey: Reeves Dam Other Clinician: Referring Sudie Bandel: Reeves Dam Treating Lavella Myren/Extender: Yaakov Guthrie in Treatment: 10 Wound Status Wound Number: 1 Primary Etiology: Diabetic Wound/Ulcer of the Lower Extremity Wound Location: Left,  Posterior Lower Leg Wound Status: Open Wounding Event: Gradually Appeared Comorbid History: Hypertension, Type II  Diabetes, Neuropathy Date Acquired: 04/08/2020 Weeks Of Treatment: 10 Clustered Wound: No Photos Wound Measurements Length: (cm) 5.5 Width: (cm) 4.5 Depth: (cm) 0.2 Area: (cm) 19.439 Volume: (cm) 3.888 % Reduction in Area: -76.8% % Reduction in Volume: -253.5% Epithelialization: Small (1-33%) Tunneling: No Undermining: No Wound Description Classification: Grade 2 Exudate Amount: Large Exudate Type: Serosanguineous Exudate Color: red, brown Foul Odor After Cleansing: No Slough/Fibrino Yes Wound Bed Granulation Amount: Medium (34-66%) Exposed Structure Granulation Quality: Red, Pink Fascia Exposed: No Necrotic Amount: Medium (34-66%) Fat Layer (Subcutaneous Tissue) Exposed: Yes Necrotic Quality: Eschar, Adherent Slough Tendon Exposed: No Muscle Exposed: No Joint Exposed: No Bone Exposed: No Electronic Signature(s) Signed: 07/29/2020 3:37:08 PM By: Donnamarie Poag Entered By: Donnamarie Poag on 07/29/2020 15:10:14 Shane Bean ([id number]) -------------------------------------------------------------------------------- [place] Details Patient Name: Shane Bean Date of Service: 07/29/2020 3:00 PM Medical Record Number: [id number] Patient Account Number: [id number] Date of Birth/Sex: [birth year] (64 y.o. M) Treating RN: Donnamarie Poag Primary Care Hali Balgobin: Reeves Dam Other Clinician: Referring Collette Pescador: Reeves Dam Treating Marsia Cino/Extender: Yaakov Guthrie in Treatment: 10 Vital Signs Time Taken: 15:02 Temperature (F): 98.7 Height (in): 70 Pulse (bpm): 71 Weight (lbs): 290 Respiratory Rate (breaths/min): 16 Body Mass Index (BMI): 41.6 Blood Pressure (mmHg): 130/71 Reference Range: 80 - 120 mg / dl Electronic Signature(s) Signed: 07/29/2020 3:37:08 PM By: Donnamarie Poag Entered ByDonnamarie Poag on 07/29/2020 15:04:49

## 2020-07-31 ENCOUNTER — Other Ambulatory Visit: Payer: Self-pay

## 2020-07-31 ENCOUNTER — Ambulatory Visit (INDEPENDENT_AMBULATORY_CARE_PROVIDER_SITE_OTHER): Payer: Medicare (Managed Care)

## 2020-07-31 DIAGNOSIS — L97822 Non-pressure chronic ulcer of other part of left lower leg with fat layer exposed: Secondary | ICD-10-CM

## 2020-07-31 DIAGNOSIS — I872 Venous insufficiency (chronic) (peripheral): Secondary | ICD-10-CM

## 2020-07-31 DIAGNOSIS — E11622 Type 2 diabetes mellitus with other skin ulcer: Secondary | ICD-10-CM | POA: Diagnosis not present

## 2020-07-31 NOTE — Progress Notes (Signed)
Shane Bean ([id number]) Visit Report for 07/31/2020 Arrival Information Details Patient Name: Shane Bean, Shane Bean Date of Service: 07/31/2020 12:30 PM Medical Record Number: [id number] Patient Account Number: [id number] Date of Birth/Sex: 11-18-1955 (64 y.o. M) Treating RN: Donnamarie Poag Primary Care Mattheus Rauls: Reeves Dam Other Clinician: Referring Andreal Vultaggio: Reeves Dam Treating Jadesola Poynter/Extender: Skipper Cliche in Treatment: 10 Visit Information History Since Last Visit Added or deleted any medications: No Patient Arrived: Ambulatory Had a fall or experienced change in No Arrival Time: 12:37 activities of daily living that may affect Accompanied By: self risk of falls: Transfer Assistance: None Hospitalized since last visit: No Patient Identification Verified: Yes Has Dressing in Place as Prescribed: Yes Secondary Verification Process Completed: Yes Has Compression in Place as Prescribed: Yes Patient Requires Transmission-Based No Pain Present Now: Yes Precautions: Patient Has Alerts: Yes Patient Alerts: Patient on Blood Thinner DIABETIC Electronic Signature(s) Signed: 07/31/2020 1:22:28 PM By: Donnamarie Poag Entered By: Donnamarie Poag on 07/31/2020 12:37:44 Shane Bean ([id number]) -------------------------------------------------------------------------------- Clinic Level of Care Assessment Details Patient Name: Shane Bean Date of Service: 07/31/2020 12:30 PM Medical Record Number: [id number] Patient Account Number: [id number] Date of Birth/Sex: [birth year] (64 y.o. M) Treating RN: Donnamarie Poag Primary Care Lizanne Erker: Reeves Dam Other Clinician: Referring Shemar Plemmons: Reeves Dam Treating Terryl Molinelli/Extender: Skipper Cliche in Treatment: 10 Clinic Level of Care Assessment Items TOOL 4 Quantity Score '[]'$  - Use when only an EandM is performed on FOLLOW-UP visit 0 ASSESSMENTS - Nursing Assessment / Reassessment '[]'$  - Reassessment of Co-morbidities  (includes updates in patient status) 0 '[]'$  - 0 Reassessment of Adherence to Treatment Plan ASSESSMENTS - Wound and Skin Assessment / Reassessment X - Simple Wound Assessment / Reassessment - one wound 1 5 '[]'$  - 0 Complex Wound Assessment / Reassessment - multiple wounds '[]'$  - 0 Dermatologic / Skin Assessment (not related to wound area) ASSESSMENTS - Focused Assessment '[]'$  - Circumferential Edema Measurements - multi extremities 0 '[]'$  - 0 Nutritional Assessment / Counseling / Intervention '[]'$  - 0 Lower Extremity Assessment (monofilament, tuning fork, pulses) '[]'$  - 0 Peripheral Arterial Disease Assessment (using hand held doppler) ASSESSMENTS - Ostomy and/or Continence Assessment and Care '[]'$  - Incontinence Assessment and Management 0 '[]'$  - 0 Ostomy Care Assessment and Management (repouching, etc.) PROCESS - Coordination of Care X - Simple Patient / Family Education for ongoing care 1 15 '[]'$  - 0 Complex (extensive) Patient / Family Education for ongoing care '[]'$  - 0 Staff obtains Programmer, systems, Records, Test Results / Process Orders '[]'$  - 0 Staff telephones HHA, Nursing Homes / Clarify orders / etc '[]'$  - 0 Routine Transfer to another Facility (non-emergent condition) '[]'$  - 0 Routine Hospital Admission (non-emergent condition) '[]'$  - 0 New Admissions / Biomedical engineer / Ordering NPWT, Apligraf, etc. '[]'$  - 0 Emergency Hospital Admission (emergent condition) X- 1 10 Simple Discharge Coordination '[]'$  - 0 Complex (extensive) Discharge Coordination PROCESS - Special Needs '[]'$  - Pediatric / Minor Patient Management 0 '[]'$  - 0 Isolation Patient Management '[]'$  - 0 Hearing / Language / Visual special needs '[]'$  - 0 Assessment of Community assistance (transportation, D/C planning, etc.) '[]'$  - 0 Additional assistance / Altered mentation '[]'$  - 0 Support Surface(s) Assessment (bed, cushion, seat, etc.) INTERVENTIONS - Wound Cleansing / Measurement Plemmons, Kveon V. ([id number]) X- 1 5 Simple Wound  Cleansing - one wound '[]'$  - 0 Complex Wound Cleansing - multiple wounds '[]'$  - 0 Wound Imaging (photographs - any number of wounds) '[]'$  - 0 Wound Tracing (instead of photographs) X- 1  5 Simple Wound Measurement - one wound '[]'$  - 0 Complex Wound Measurement - multiple wounds INTERVENTIONS - Wound Dressings X - Small Wound Dressing one or multiple wounds 1 10 '[]'$  - 0 Medium Wound Dressing one or multiple wounds '[]'$  - 0 Large Wound Dressing one or multiple wounds '[]'$  - 0 Application of Medications - topical '[]'$  - 0 Application of Medications - injection INTERVENTIONS - Miscellaneous '[]'$  - External ear exam 0 '[]'$  - 0 Specimen Collection (cultures, biopsies, blood, body fluids, etc.) '[]'$  - 0 Specimen(s) / Culture(s) sent or taken to Lab for analysis '[]'$  - 0 Patient Transfer (multiple staff / Civil Service fast streamer / Similar devices) '[]'$  - 0 Simple Staple / Suture removal (25 or less) '[]'$  - 0 Complex Staple / Suture removal (26 or more) '[]'$  - 0 Hypo / Hyperglycemic Management (close monitor of Blood Glucose) '[]'$  - 0 Ankle / Brachial Index (ABI) - do not check if billed separately X- 1 5 Vital Signs Has the patient been seen at the hospital within the last three years: Yes Total Score: 55 Level Of Care: New/Established - Level 2 Electronic Signature(s) Signed: 07/31/2020 1:22:28 PM By: Donnamarie Poag Entered By: Donnamarie Poag on 07/31/2020 12:48:51 Shane Bean ([id number]) -------------------------------------------------------------------------------- Encounter Discharge Information Details Patient Name: Shane Bean Date of Service: 07/31/2020 12:30 PM Medical Record Number: [id number] Patient Account Number: [id number] Date of Birth/Sex: [birth year] (64 y.o. M) Treating RN: Donnamarie Poag Primary Care Artemisia Auvil: Reeves Dam Other Clinician: Referring Winfield Caba: Reeves Dam Treating Saren Corkern/Extender: Skipper Cliche in Treatment: 10 Encounter Discharge Information Items Discharge  Condition: Stable Ambulatory Status: Ambulatory Discharge Destination: Home Transportation: Private Auto Accompanied By: self Schedule Follow-up Appointment: Yes Clinical Summary of Care: Electronic Signature(s) Signed: 07/31/2020 1:22:28 PM By: Donnamarie Poag Entered By: Donnamarie Poag on 07/31/2020 12:48:17 Shane Bean ([id number]) -------------------------------------------------------------------------------- Wound Assessment Details Patient Name: Shane Bean Date of Service: 07/31/2020 12:30 PM Medical Record Number: [id number] Patient Account Number: [id number] Date of Birth/Sex: October 24, 1955 (64 y.o. M) Treating RN: Donnamarie Poag Primary Care Debrina Kizer: Reeves Dam Other Clinician: Referring Emileigh Kellett: Reeves Dam Treating Harvel Meskill/Extender: Jeri Cos Weeks in Treatment: 10 Wound Status Wound Number: 1 Primary Etiology: Diabetic Wound/Ulcer of the Lower Extremity Wound Location: Left, Posterior Lower Leg Wound Status: Open Wounding Event: Gradually Appeared Comorbid History: Hypertension, Type II Diabetes, Neuropathy Date Acquired: 04/08/2020 Weeks Of Treatment: 10 Clustered Wound: No Wound Measurements Length: (cm) 5 Width: (cm) 4 Depth: (cm) 0.2 Area: (cm) 15.708 Volume: (cm) 3.142 % Reduction in Area: -42.9% % Reduction in Volume: -185.6% Epithelialization: Small (1-33%) Wound Description Classification: Grade 2 Exudate Amount: Large Exudate Type: Serosanguineous Exudate Color: red, brown Foul Odor After Cleansing: No Slough/Fibrino Yes Wound Bed Granulation Amount: Medium (34-66%) Exposed Structure Granulation Quality: Red, Pink Fascia Exposed: No Necrotic Amount: Medium (34-66%) Fat Layer (Subcutaneous Tissue) Exposed: Yes Necrotic Quality: Eschar, Adherent Slough Tendon Exposed: No Muscle Exposed: No Joint Exposed: No Bone Exposed: No Treatment Notes Wound #1 (Lower Leg) Wound Laterality: Left, Posterior Cleanser Soap and  Water Discharge Instruction: Gently cleanse wound with antibacterial soap, rinse and pat dry prior to dressing wounds Peri-Wound Care Triamcinolone Acetonide Cream, 0.1%, 15 (g) tube Discharge Instruction: peri wound Topical Santyl Collagenase Ointment, 30 (gm), tube Discharge Instruction: Apply to wound in clinic only. Primary Dressing Hydrofera Blue Ready Transfer Foam, 2.5x2.5 (in/in) Discharge Instruction: Apply Hydrofera Blue Ready to wound bed as directed Secondary Dressing ABD Pad 5x9 (in/in) Discharge Instruction: Cover with ABD pad MEDARD, BERO ([id number]) Secured With  Coban Cohesive Bandage 4x5 (yds) Stretched Discharge Instruction: Apply Coban from base of toes to 3-finger-widths below knee Kerlix Roll Sterile or Non-Sterile 6-ply 4.5x4 (yd/yd) Discharge Instruction: Apply Kerlix from base of toes to 3-finger-widths below knee Compression Wrap Compression Stockings Add-Ons Electronic Signature(s) Signed: 07/31/2020 1:22:28 PM By: Donnamarie Poag Entered ByDonnamarie Poag on 07/31/2020 12:42:52

## 2020-07-31 NOTE — Progress Notes (Signed)
Shane Bean, Shane Bean ([id number]) Visit Report for 07/29/2020 Chief Complaint Document Details Patient Name: Shane Bean, Shane Bean Date of Service: 07/29/2020 3:00 PM Medical Record Number: [id number] Patient Account Number: [id number] Date of Birth/Sex: 16-Nov-1955 (64 y.o. M) Treating RN: Cornell Barman Primary Care Provider: Reeves Dam Other Clinician: Referring Provider: Reeves Dam Treating Provider/Extender: Yaakov Guthrie in Treatment: 10 Information Obtained from: Patient Chief Complaint Left LE Ulcer Electronic Signature(s) Signed: 07/29/2020 4:55:13 PM By: Kalman Shan DO Entered By: Kalman Shan on 07/29/2020 [place], [place]. ([id number]) -------------------------------------------------------------------------------- Debridement Details Patient Name: Shane Bean Date of Service: 07/29/2020 3:00 PM Medical Record Number: [id number] Patient Account Number: [id number] Date of Birth/Sex: May 19, 1955 (64 y.o. M) Treating RN: Donnamarie Poag Primary Care Provider: Reeves Dam Other Clinician: Referring Provider: Reeves Dam Treating Provider/Extender: Yaakov Guthrie in Treatment: 10 Debridement Performed for Wound #1 Left,Posterior Lower Leg Assessment: Performed By: Physician Kalman Shan, MD Debridement Type: Debridement Severity of Tissue Pre Debridement: Fat layer exposed Level of Consciousness (Pre- Awake and Alert procedure): Pre-procedure Verification/Time Out Yes - 15:58 Taken: Start Time: 15:58 Total Area Debrided (L x W): 2 (cm) x 2 (cm) = 4 (cm) Tissue and other material Viable, Non-Viable, Slough, Subcutaneous, Slough debrided: Level: Skin/Subcutaneous Tissue Debridement Description: Excisional Instrument: Curette Bleeding: Minimum Hemostasis Achieved: Pressure Response to Treatment: Procedure was tolerated well Level of Consciousness (Post- Awake and Alert procedure): Post Debridement Measurements of Total  Wound Length: (cm) 5.5 Width: (cm) 4.5 Depth: (cm) 0.2 Volume: (cm) 3.888 Character of Wound/Ulcer Post Debridement: Improved Severity of Tissue Post Debridement: Fat layer exposed Post Procedure Diagnosis Same as Pre-procedure Electronic Signature(s) Signed: 07/29/2020 4:55:13 PM By: Kalman Shan DO Signed: 07/31/2020 1:22:28 PM By: Donnamarie Poag Entered By: Donnamarie Poag on 07/29/2020 15:59:37 Shane Bean ([id number]) -------------------------------------------------------------------------------- HPI Details Patient Name: Shane Bean Date of Service: 07/29/2020 3:00 PM Medical Record Number: [id number] Patient Account Number: [id number] Date of Birth/Sex: Nov 09, 1955 (64 y.o. M) Treating RN: Cornell Barman Primary Care Provider: Reeves Dam Other Clinician: Referring Provider: Reeves Dam Treating Provider/Extender: Yaakov Guthrie in Treatment: 10 History of Present Illness HPI Description: 05/18/2020 upon evaluation today patient appears for initial evaluation here in clinic concerning issues that he has been having with the wound on his left lateral leg. Fortunately there does not appear to be any signs of obvious an active infection at this time which is great news. With that being said the patient unfortunately is continued to have issues with pain although he tells me it is gotten a lot better. He was on Keflex as well as Bactrim DS which seems to have done a good job there. His most recent hemoglobin A1c was 9.8 that was on 04/14/2020. Subsequently I do feel like that the patient is making progress here. He does have a history of chronic venous insufficiency as well as hypertension. I do not see any need for antibiotics at this point. 5/25; follow-up of the wound on the left posterior calf. This is completely necrotic on the surface. It does not look infected but it is painful. He is a diabetic but I think there is some suggestion here of chronic venous  disease as well. We used Iodoflex under compression last week 6/1; again a completely necrotic surface on this with the wound. We have been using Iodoflex under compression he complains of gnawing pain. He has had wounds previously a lot of this looks like chronic venous disease. 6/8; about two thirds of the surface of this wound  is still covered in a black necrotic eschar. I changed him from Iodoflex to [place] last week because of complaints of pain. He actually was seen by her neurologist this weeko Peripheral neuropathy as a cause of the pain and they changed him from Neurontin to Lyrica but he still has not had any relief. He says that most of the pain however is in his heel not in his wound per se. He is a diabetic 6/15. This is a patient with what looks to be a venous wound on the left lateral lower leg. He is also a diabetic. Currently being worked up for diabetic neuropathy. He complains of pain out of proportion to the size of the wound although to be fair the entire area here was eschared. We have been working to get a viable surface. We are using Sorbact 6/22; fairly painful wound on the left posterior calf. I am assuming this is been venous he is also a diabetic. His ABI in our clinic was noncompressible however he has easily palpable pulses on his feet. He complains of a lot of pain in the wound also of the left heel and the upper left calf. Some of this may be neuropathy. It is led me to discontinue Iodoflex reduce his compression but he still complains of pain in fact he says he could not take a debridement today. When I first saw this it was completely necrotic surface we have got it down to something that looks a reasonably healthy I have been wondering about biopsying this area however he is on Coumadin and with the pain I have put this off. The major question would be an inflammatory ulcer such as pyoderma. The patient is not aware of how this started. He does however have a wound  history on both legs. 6/29; patient presents for 1 week follow-up. He has been using Hydrofera Blue under Kerlix/Coban. He has tolerated this well. He currently denies signs of infection. He also declines debridement today. He he states it is painful and does not want to have this done. 7/6; Patient presents for 1 week follow-up. He has been using Hydrofera Blue under Kerlix/Coban. He denies signs of infection. He declines debridement today. 7/15; patient presents for 1 week follow-up. He has been using Hydrofera Blue under Kerlix/Coban. He denies signs of infection today. He is agreeable to having debridement done today. 7/20; patient presents for 1 week follow-up. He is in good spirits today. He has been using Hydrofera Blue under Kerlix/Coban. He denies signs of infection today. 7/27; patient presents for 1 week follow-up. He is in good spirits today. He has no issues or complaints today. He denies signs of infection. Electronic Signature(s) Signed: 07/29/2020 4:55:13 PM By: Kalman Shan DO Entered By: Kalman Shan on 07/29/2020 16:51:38 Shane Bean ([id number]) -------------------------------------------------------------------------------- Physical Exam Details Patient Name: Shane Bean Date of Service: 07/29/2020 3:00 PM Medical Record Number: [id number] Patient Account Number: [id number] Date of Birth/Sex: 17-Jul-1955 (64 y.o. M) Treating RN: Cornell Barman Primary Care Provider: Reeves Dam Other Clinician: Referring Provider: Reeves Dam Treating Provider/Extender: Yaakov Guthrie in Treatment: 10 Constitutional . Cardiovascular . Psychiatric . Notes Left lower extremity: Open wound to the posterior aspect. There is some granulation tissue and nonviable tissue present. No obvious signs of infection. Electronic Signature(s) Signed: 07/29/2020 4:55:13 PM By: Kalman Shan DO Entered By: Kalman Shan on 07/29/2020 16:53:33 Shane Bean  ([id number]) -------------------------------------------------------------------------------- Physician Orders Details Patient Name: Shane Bean Date of Service: 07/29/2020 3:00 PM  Medical Record Number: [id number] Patient Account Number: [id number] Date of Birth/Sex: [birth year] (64 y.o. M) Treating RN: Donnamarie Poag Primary Care Provider: Reeves Dam Other Clinician: Referring Provider: Reeves Dam Treating Provider/Extender: Yaakov Guthrie in Treatment: 10 Verbal / Phone Orders: No Diagnosis Coding Follow-up Appointments o Return Appointment in 1 week. o Nurse Visit as needed Bathing/ Shower/ Hygiene o May shower with wound dressing protected with water repellent cover or cast protector. - cover dressing during shower-do not get it wet Edema Control - Lymphedema / Segmental Compressive Device / Other o Elevate, Exercise Daily and Avoid Standing for Long Periods of Time. o Elevate legs to the level of the heart and pump ankles as often as possible o Elevate leg(s) parallel to the floor when sitting. Wound Treatment Wound #1 - Lower Leg Wound Laterality: Left, Posterior Cleanser: Soap and Water 1 x Per Week/30 Days Discharge Instructions: Gently cleanse wound with antibacterial soap, rinse and pat dry prior to dressing wounds Peri-Wound Care: Triamcinolone Acetonide Cream, 0.1%, 15 (g) tube 1 x Per Week/30 Days Discharge Instructions: peri wound Topical: Santyl Collagenase Ointment, 30 (gm), tube 1 x Per Week/30 Days Discharge Instructions: Apply to wound in clinic only. Primary Dressing: Hydrofera Blue Ready Transfer Foam, 2.5x2.5 (in/in) 1 x Per Week/30 Days Discharge Instructions: Apply Hydrofera Blue Ready to wound bed as directed Secondary Dressing: ABD Pad 5x9 (in/in) 1 x Per Week/30 Days Discharge Instructions: Cover with ABD pad Secured With: Coban Cohesive Bandage 4x5 (yds) Stretched 1 x Per Week/30 Days Discharge Instructions: Apply Coban from  base of toes to 3-finger-widths below knee Secured With: Kerlix Roll Sterile or Non-Sterile 6-ply 4.5x4 (yd/yd) 1 x Per Week/30 Days Discharge Instructions: Apply Kerlix from base of toes to 3-finger-widths below knee Electronic Signature(s) Signed: 07/29/2020 4:55:13 PM By: Kalman Shan DO Signed: 07/31/2020 1:22:28 PM By: Donnamarie Poag Entered By: Donnamarie Poag on 07/29/2020 16:00:47 Shane Bean ([id number]) -------------------------------------------------------------------------------- Problem List Details Patient Name: Shane Bean Date of Service: 07/29/2020 3:00 PM Medical Record Number: [id number] Patient Account Number: [id number] Date of Birth/Sex: November 30, 1955 (64 y.o. M) Treating RN: Cornell Barman Primary Care Provider: Reeves Dam Other Clinician: Referring Provider: Reeves Dam Treating Provider/Extender: Yaakov Guthrie in Treatment: 10 Active Problems ICD-10 Encounter Code Description Active Date MDM Diagnosis E11.622 Type 2 diabetes mellitus with other skin ulcer 05/18/2020 No Yes I87.2 Venous insufficiency (chronic) (peripheral) 05/18/2020 No Yes 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 layer 05/18/2020 No Yes exposed [place] (primary) hypertension 05/18/2020 No Yes Inactive Problems Resolved Problems Electronic Signature(s) Signed: 07/29/2020 4:55:13 PM By: Kalman Shan DO Entered By: Kalman Shan on 07/29/2020 16:50:54 Shane Bean ([id number]) -------------------------------------------------------------------------------- Progress Note Details Patient Name: Shane Bean Date of Service: 07/29/2020 3:00 PM Medical Record Number: [id number] Patient Account Number: [id number] Date of Birth/Sex: 01-05-1955 (64 y.o. M) Treating RN: Cornell Barman Primary Care Provider: Reeves Dam Other Clinician: Referring Provider: Reeves Dam Treating Provider/Extender: Yaakov Guthrie in  Treatment: 10 Subjective Chief Complaint Information obtained from Patient Left LE Ulcer History of Present Illness (HPI) 05/18/2020 upon evaluation today patient appears for initial evaluation here in clinic concerning issues that he has been having with the wound on his left lateral leg. Fortunately there does not appear to be any signs of obvious an active infection at this time which is great news. With that being said the patient unfortunately is continued to have issues with pain although he tells me it is gotten a  lot better. He was on Keflex as well as Bactrim DS which seems to have done a good job there. His most recent hemoglobin A1c was 9.8 that was on 04/14/2020. Subsequently I do feel like that the patient is making progress here. He does have a history of chronic venous insufficiency as well as hypertension. I do not see any need for antibiotics at this point. 5/25; follow-up of the wound on the left posterior calf. This is completely necrotic on the surface. It does not look infected but it is painful. He is a diabetic but I think there is some suggestion here of chronic venous disease as well. We used Iodoflex under compression last week 6/1; again a completely necrotic surface on this with the wound. We have been using Iodoflex under compression he complains of gnawing pain. He has had wounds previously a lot of this looks like chronic venous disease. 6/8; about two thirds of the surface of this wound is still covered in a black necrotic eschar. I changed him from Iodoflex to [place] last week because of complaints of pain. He actually was seen by her neurologist this weeko Peripheral neuropathy as a cause of the pain and they changed him from Neurontin to Lyrica but he still has not had any relief. He says that most of the pain however is in his heel not in his wound per se. He is a diabetic 6/15. This is a patient with what looks to be a venous wound on the left lateral lower  leg. He is also a diabetic. Currently being worked up for diabetic neuropathy. He complains of pain out of proportion to the size of the wound although to be fair the entire area here was eschared. We have been working to get a viable surface. We are using Sorbact 6/22; fairly painful wound on the left posterior calf. I am assuming this is been venous he is also a diabetic. His ABI in our clinic was noncompressible however he has easily palpable pulses on his feet. He complains of a lot of pain in the wound also of the left heel and the upper left calf. Some of this may be neuropathy. It is led me to discontinue Iodoflex reduce his compression but he still complains of pain in fact he says he could not take a debridement today. When I first saw this it was completely necrotic surface we have got it down to something that looks a reasonably healthy I have been wondering about biopsying this area however he is on Coumadin and with the pain I have put this off. The major question would be an inflammatory ulcer such as pyoderma. The patient is not aware of how this started. He does however have a wound history on both legs. 6/29; patient presents for 1 week follow-up. He has been using Hydrofera Blue under Kerlix/Coban. He has tolerated this well. He currently denies signs of infection. He also declines debridement today. He he states it is painful and does not want to have this done. 7/6; Patient presents for 1 week follow-up. He has been using Hydrofera Blue under Kerlix/Coban. He denies signs of infection. He declines debridement today. 7/15; patient presents for 1 week follow-up. He has been using Hydrofera Blue under Kerlix/Coban. He denies signs of infection today. He is agreeable to having debridement done today. 7/20; patient presents for 1 week follow-up. He is in good spirits today. He has been using Hydrofera Blue under Kerlix/Coban. He denies signs of infection today. 7/27;  patient presents  for 1 week follow-up. He is in good spirits today. He has no issues or complaints today. He denies signs of infection. Patient History Information obtained from Patient. Social History Never smoker, Marital Status - Single, Alcohol Use - Moderate, Drug Use - No History, Caffeine Use - Never. Medical History Eyes Denies history of Cataracts, Glaucoma, Optic Neuritis Ear/Nose/Mouth/Throat Denies history of Chronic sinus problems/congestion Hematologic/Lymphatic Denies history of Anemia, Hemophilia, Human Immunodeficiency Virus, Lymphedema Respiratory Denies history of Aspiration, Asthma, Chronic Obstructive Pulmonary Disease (COPD), Pneumothorax, Sleep Apnea, Tuberculosis Cardiovascular Patient has history of Hypertension Shane Bean, Shane V. ([id number]) Denies history of Angina, Arrhythmia, Congestive Heart Failure, Coronary Artery Disease, Deep Vein Thrombosis, Hypotension, Myocardial Infarction, Peripheral Arterial Disease, Peripheral Venous Disease, Phlebitis, Vasculitis Gastrointestinal Denies history of Cirrhosis , Colitis, Crohn s, Hepatitis A, Hepatitis B Endocrine Patient has history of Type II Diabetes Denies history of Type I Diabetes Genitourinary Denies history of End Stage Renal Disease Immunological Denies history of Lupus Erythematosus, Raynaud s, Scleroderma Integumentary (Skin) Denies history of History of Burn, History of pressure wounds Musculoskeletal Denies history of Gout, Rheumatoid Arthritis, Osteoarthritis, Osteomyelitis Neurologic Patient has history of Neuropathy Denies history of Dementia, Quadriplegia, Paraplegia, Seizure Disorder Oncologic Denies history of Received Chemotherapy, Received Radiation Psychiatric Denies history of Anorexia/bulimia, Confinement Anxiety Objective Constitutional Vitals Time Taken: 3:02 PM, Height: 70 in, Weight: 290 lbs, BMI: 41.6, Temperature: 98.7 F, Pulse: 71 bpm, Respiratory Rate: 16 breaths/min, Blood Pressure:  130/71 mmHg. General Notes: Left lower extremity: Open wound to the posterior aspect. There is some granulation tissue and nonviable tissue present. No obvious signs of infection. Integumentary (Hair, Skin) Wound #1 status is Open. Original cause of wound was Gradually Appeared. The date acquired was: 04/08/2020. The wound has been in treatment 10 weeks. The wound is located on the Left,Posterior Lower Leg. The wound measures 5.5cm length x 4.5cm width x 0.2cm depth; 19.439cm^2 area and 3.888cm^3 volume. There is Fat Layer (Subcutaneous Tissue) exposed. There is no tunneling or undermining noted. There is a large amount of serosanguineous drainage noted. There is medium (34-66%) red, pink granulation within the wound bed. There is a medium (34-66%) amount of necrotic tissue within the wound bed including Eschar and Adherent Slough. Assessment Active Problems ICD-10 Type 2 diabetes mellitus with other skin ulcer Venous insufficiency (chronic) (peripheral) Non-pressure chronic ulcer of other part of left lower leg with fat layer exposed Essential (primary) hypertension Patient's wound continues to show signs of improvement. I debrided nonviable tissue. I recommended continuing Hydrofera Blue under Kerlix/Coban. Patient is obtaining ABIs w/ TBIs later this week. Procedures Shane Bean, Shane Bean ([id number]) Wound #1 Pre-procedure diagnosis of Wound #1 is a Diabetic Wound/Ulcer of the Lower Extremity located on the Left,Posterior Lower Leg .Severity of Tissue Pre Debridement is: Fat layer exposed. There was a Excisional Skin/Subcutaneous Tissue Debridement with a total area of 4 sq cm performed by Kalman Shan, MD. With the following instrument(s): Curette to remove Viable and Non-Viable tissue/material. Material removed includes Subcutaneous Tissue and Slough and. A time out was conducted at 15:58, prior to the start of the procedure. A Minimum amount of bleeding was controlled with Pressure.  The procedure was tolerated well. Post Debridement Measurements: 5.5cm length x 4.5cm width x 0.2cm depth; 3.888cm^3 volume. Character of Wound/Ulcer Post Debridement is improved. Severity of Tissue Post Debridement is: Fat layer exposed. Post procedure Diagnosis Wound #1: Same as Pre-Procedure Plan Follow-up Appointments: Return Appointment in 1 week. Nurse Visit as needed Bathing/ Shower/ Hygiene: May  shower with wound dressing protected with water repellent cover or cast protector. - cover dressing during shower-do not get it wet Edema Control - Lymphedema / Segmental Compressive Device / Other: Elevate, Exercise Daily and Avoid Standing for Long Periods of Time. Elevate legs to the level of the heart and pump ankles as often as possible Elevate leg(s) parallel to the floor when sitting. WOUND #1: - Lower Leg Wound Laterality: Left, Posterior Cleanser: Soap and Water 1 x Per Week/30 Days Discharge Instructions: Gently cleanse wound with antibacterial soap, rinse and pat dry prior to dressing wounds Peri-Wound Care: Triamcinolone Acetonide Cream, 0.1%, 15 (g) tube 1 x Per Week/30 Days Discharge Instructions: peri wound Topical: Santyl Collagenase Ointment, 30 (gm), tube 1 x Per Week/30 Days Discharge Instructions: Apply to wound in clinic only. Primary Dressing: Hydrofera Blue Ready Transfer Foam, 2.5x2.5 (in/in) 1 x Per Week/30 Days Discharge Instructions: Apply Hydrofera Blue Ready to wound bed as directed Secondary Dressing: ABD Pad 5x9 (in/in) 1 x Per Week/30 Days Discharge Instructions: Cover with ABD pad Secured With: Coban Cohesive Bandage 4x5 (yds) Stretched 1 x Per Week/30 Days Discharge Instructions: Apply Coban from base of toes to 3-finger-widths below knee Secured With: Kerlix Roll Sterile or Non-Sterile 6-ply 4.5x4 (yd/yd) 1 x Per Week/30 Days Discharge Instructions: Apply Kerlix from base of toes to 3-finger-widths below knee 1. In office sharp debridement 2. Hydrofera  Blue under Kerlix/Coban 3. Follow-up in 1 week Electronic Signature(s) Signed: 07/29/2020 4:55:13 PM By: Kalman Shan DO Entered By: Kalman Shan on 07/29/2020 16:54:39 Shane Bean ([id number]) -------------------------------------------------------------------------------- ROS/PFSH Details Patient Name: Shane Bean Date of Service: 07/29/2020 3:00 PM Medical Record Number: [id number] Patient Account Number: [id number] Date of Birth/Sex: Apr 30, 1955 (64 y.o. M) Treating RN: Cornell Barman Primary Care Provider: Reeves Dam Other Clinician: Referring Provider: Reeves Dam Treating Provider/Extender: Yaakov Guthrie in Treatment: 10 Information Obtained From Patient Eyes Medical History: Negative for: Cataracts; Glaucoma; Optic Neuritis Ear/Nose/Mouth/Throat Medical History: Negative for: Chronic sinus problems/congestion Hematologic/Lymphatic Medical History: Negative for: Anemia; Hemophilia; Human Immunodeficiency Virus; Lymphedema Respiratory Medical History: Negative for: Aspiration; Asthma; Chronic Obstructive Pulmonary Disease (COPD); Pneumothorax; Sleep Apnea; Tuberculosis Cardiovascular Medical History: Positive for: Hypertension Negative for: Angina; Arrhythmia; Congestive Heart Failure; Coronary Artery Disease; Deep Vein Thrombosis; Hypotension; Myocardial Infarction; Peripheral Arterial Disease; Peripheral Venous Disease; Phlebitis; Vasculitis Gastrointestinal Medical History: Negative for: Cirrhosis ; Colitis; Crohnos; Hepatitis A; Hepatitis B Endocrine Medical History: Positive for: Type II Diabetes Negative for: Type I Diabetes Time with diabetes: 3 Treated with: Oral agents Blood sugar tested every day: No Genitourinary Medical History: Negative for: End Stage Renal Disease Immunological Medical History: Negative for: Lupus Erythematosus; Raynaudos; Scleroderma Integumentary (Skin) Medical History: Negative for: History of  Burn; History of pressure wounds Shane Bean, Shane V. ([id number]) Musculoskeletal Medical History: Negative for: Gout; Rheumatoid Arthritis; Osteoarthritis; Osteomyelitis Neurologic Medical History: Positive for: Neuropathy Negative for: Dementia; Quadriplegia; Paraplegia; Seizure Disorder Oncologic Medical History: Negative for: Received Chemotherapy; Received Radiation Psychiatric Medical History: Negative for: Anorexia/bulimia; Confinement Anxiety Immunizations Pneumococcal Vaccine: Received Pneumococcal Vaccination: Yes Received Pneumococcal Vaccination On or After 60th Birthday: No Implantable Devices None Family and Social History Never smoker; Marital Status - Single; Alcohol Use: Moderate; Drug Use: No History; Caffeine Use: Never; Financial Concerns: No; Food, Clothing or Shelter Needs: No; Support System Lacking: No; Transportation Concerns: No Electronic Signature(s) Signed: 07/29/2020 4:55:13 PM By: Kalman Shan DO Signed: 07/29/2020 5:51:34 PM By: Gretta Cool, BSN, RN, CWS, Kim RN, BSN Entered By: Kalman Shan on 07/29/2020 16:53:03 [place], Shane Bean ([id number]) -------------------------------------------------------------------------------- SuperBill Details Patient Name: Shane Bean Date of Service: 07/29/2020 Medical Record Number: [id number] Patient Account Number: [id number] Date of Birth/Sex: Aug 06, 1955 (64 y.o. M) Treating RN: Donnamarie Poag Primary Care Provider: Reeves Dam Other Clinician: Referring Provider: Reeves Dam Treating Provider/Extender: Yaakov Guthrie in Treatment: 10 Diagnosis Coding ICD-10 Codes Code Description E11.622 Type 2 diabetes mellitus with other skin ulcer I87.2 Venous insufficiency (chronic) (peripheral) 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 layer exposed I10 Essential (primary) hypertension Facility Procedures CPT4 Code: [id number] Description: [id number] - DEB SUBQ TISSUE  20 SQ CM/< Modifier: Quantity: 1 CPT4 Code: Description: ICD-10 Diagnosis Description 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 layer Modifier: exposed Quantity: Physician Procedures CPT4 Code: [id number] Description: 11042 - WC PHYS SUBQ TISS 20 SQ CM Modifier: Quantity: 1 CPT4 Code: Description: ICD-10 Diagnosis Description 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 layer Modifier: exposed Quantity: Electronic Signature(s) Signed: 07/29/2020 4:55:13 PM By: Kalman Shan DO Entered By: Kalman Shan on 07/29/2020 16:54:46

## 2020-08-05 ENCOUNTER — Ambulatory Visit: Payer: Medicare (Managed Care) | Admitting: Internal Medicine

## 2020-08-12 ENCOUNTER — Encounter: Payer: Medicare (Managed Care) | Attending: Physician Assistant | Admitting: Physician Assistant

## 2020-08-12 ENCOUNTER — Other Ambulatory Visit: Payer: Self-pay

## 2020-08-12 DIAGNOSIS — E1151 Type 2 diabetes mellitus with diabetic peripheral angiopathy without gangrene: Secondary | ICD-10-CM | POA: Diagnosis not present

## 2020-08-12 DIAGNOSIS — L97829 Non-pressure chronic ulcer of other part of left lower leg with unspecified severity: Secondary | ICD-10-CM | POA: Diagnosis present

## 2020-08-12 DIAGNOSIS — I872 Venous insufficiency (chronic) (peripheral): Secondary | ICD-10-CM | POA: Insufficient documentation

## 2020-08-12 DIAGNOSIS — E11622 Type 2 diabetes mellitus with other skin ulcer: Secondary | ICD-10-CM | POA: Diagnosis not present

## 2020-08-12 DIAGNOSIS — E1142 Type 2 diabetes mellitus with diabetic polyneuropathy: Secondary | ICD-10-CM | POA: Insufficient documentation

## 2020-08-12 DIAGNOSIS — I10 Essential (primary) hypertension: Secondary | ICD-10-CM | POA: Insufficient documentation

## 2020-08-12 DIAGNOSIS — I87331 Chronic venous hypertension (idiopathic) with ulcer and inflammation of right lower extremity: Secondary | ICD-10-CM | POA: Diagnosis not present

## 2020-08-12 DIAGNOSIS — L97822 Non-pressure chronic ulcer of other part of left lower leg with fat layer exposed: Secondary | ICD-10-CM | POA: Diagnosis not present

## 2020-08-12 NOTE — Progress Notes (Signed)
GERVASE, COLBERG ([id number]) Visit Report for 08/12/2020 Arrival Information Details Patient Name: Shane Bean, Shane Bean Date of Service: 08/12/2020 2:15 PM Medical Record Number: [id number] Patient Account Number: [id number] Date of Birth/Sex: [birth year] (65 y.o. M) Treating RN: Donnamarie Poag Primary Care Wofford Stratton: Reeves Dam Other Clinician: Referring Ilsa Bonello: Reeves Dam Treating Tynisa Vohs/Extender: Skipper Cliche in Treatment: 12 Visit Information History Since Last Visit Added or deleted any medications: No Patient Arrived: Ambulatory Had a fall or experienced change in No Arrival Time: 14:51 activities of daily living that may affect Accompanied By: self risk of falls: Transfer Assistance: None Hospitalized since last visit: No Patient Identification Verified: Yes Has Dressing in Place as Prescribed: Yes Secondary Verification Process Completed: Yes Pain Present Now: Yes Patient Requires Transmission-Based No Precautions: Patient Has Alerts: Yes Patient Alerts: Patient on Blood Thinner 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 M) Treating RN: Donnamarie Poag Primary Care Ardean Simonich: Reeves Dam Other Clinician: Referring Kelsha Older: Reeves Dam Treating Chakara Bognar/Extender: Skipper Cliche in Treatment: 12 Clinic Level of Care Assessment Items TOOL 1 Quantity Score []  - Use when EandM and Procedure is performed on INITIAL visit 0 ASSESSMENTS - Nursing Assessment / Reassessment []  - General Physical Exam (combine w/  comprehensive assessment (listed just below) when performed on new 0 pt. evals) []  - 0 Comprehensive Assessment (HX, ROS, Risk Assessments, Wounds Hx, etc.) ASSESSMENTS - Wound and Skin Assessment / Reassessment []  - Dermatologic / Skin Assessment (not related to wound area) 0 ASSESSMENTS - Ostomy and/or Continence Assessment and Care []  - Incontinence Assessment and Management 0 []  - 0 Ostomy Care Assessment and Management (repouching, etc.) PROCESS - Coordination of Care []  - Simple Patient / Family Education for ongoing care 0 []  - 0 Complex (extensive) Patient / Family Education for ongoing care []  - 0 Staff obtains Programmer, systems, Records, Test Results / Process Orders []  - 0 Staff telephones HHA, Nursing Homes / Clarify orders / etc []  - 0 Routine Transfer to another Facility (non-emergent condition) []  - 0 Routine Hospital Admission (non-emergent condition) []  - 0 New Admissions / Biomedical engineer / Ordering NPWT, Apligraf, etc. []  - 0 Emergency Hospital Admission (emergent condition) PROCESS - Special Needs []  - Pediatric / Minor Patient Management 0 []  - 0 Isolation Patient Management []  - 0 Hearing / Language / Visual special needs []  - 0 Assessment of Community assistance (transportation, D/C planning, etc.) []  - 0 Additional assistance / Altered mentation []  - 0 Support Surface(s) Assessment (bed, cushion, seat, etc.) INTERVENTIONS - Miscellaneous []  - External ear exam 0 []  - 0 Patient Transfer (multiple staff / Civil Service fast streamer / Similar devices) []  - 0 Simple Staple / Suture removal (25 or less) []  - 0 Complex Staple / Suture removal (26 or more) []  - 0 Hypo/Hyperglycemic Management (do not check if billed separately) []  - 0 Ankle / Brachial Index (ABI) - do not check if billed separately Has the patient been seen at the hospital within the last three years: Yes Total Score: 0 Level Of Care: ____ Shane Bean ([id number]) Electronic  Signature(s) Signed: 08/12/2020 4:15:45 PM By: Donnamarie Poag Entered By: Donnamarie Poag on 08/12/2020 15:19:49 Shane Bean, Shane Bean ([id number]) -------------------------------------------------------------------------------- Compression Therapy Details  Patient Name: Shane Bean, Shane Bean Date of Service: 08/12/2020 2:15 PM Medical Record Number: [id number] Patient Account Number: [id number] Date of Birth/Sex: 05-31-1955 (65 y.o. M) Treating RN: Donnamarie Poag Primary Care Syeda Prickett: Reeves Dam Other Clinician: Referring Caisen Mangas: Reeves Dam Treating Emmett Bracknell/Extender: Skipper Cliche in Treatment: 12 Compression Therapy Performed for Wound Assessment: Wound #1 Left,Posterior Lower Leg Performed By: Clinician Donnamarie Poag, RN Compression Type: Three Layer Post Procedure Diagnosis Same as Pre-procedure Electronic Signature(s) Signed: 08/12/2020 4:15:45 PM By: Donnamarie Poag Entered By: Donnamarie Poag on 08/12/2020 15:16:07 Shane Bean ([id number]) -------------------------------------------------------------------------------- Encounter Discharge Information Details Patient Name: Shane Bean Date of Service: 08/12/2020 2:15 PM Medical Record Number: [id number] Patient Account Number: [id number] Date of Birth/Sex: [birth year] (65 y.o. M) Treating RN: Donnamarie Poag Primary Care Hilton Saephan: Reeves Dam Other Clinician: Referring Solangel Mcmanaway: Reeves Dam Treating Siddhant Hashemi/Extender: Skipper Cliche in Treatment: 12 Encounter Discharge Information Items Post Procedure Vitals Discharge Condition: Stable Temperature (F): 98.2 Ambulatory Status: Ambulatory Pulse (bpm): 75 Discharge Destination: Home Respiratory Rate (breaths/min): 16 Transportation: Private Auto Blood Pressure (mmHg): 164/55 Accompanied By: self Schedule Follow-up Appointment: Yes Clinical Summary of Care: Electronic Signature(s) Signed: 08/12/2020 4:15:45 PM By: Donnamarie Poag Entered By: Donnamarie Poag on 08/12/2020  15:32:15 Shane Bean ([id number]) -------------------------------------------------------------------------------- Lower Extremity Assessment Details Patient Name: Shane Bean Date of Service: 08/12/2020 2:15 PM Medical Record Number: [id number] Patient Account Number: [id number] Date of Birth/Sex: [birth year] (65 y.o. M) Treating RN: Donnamarie Poag Primary Care Xzavier Swinger: Reeves Dam Other Clinician: Referring Vergene Marland: Reeves Dam Treating Myrka Sylva/Extender: Jeri Cos Weeks in Treatment: 12 Edema Assessment Assessed: [Left: Yes] [Right: No] Edema: [Left: N] [Right: o] Calf Left: Right: Point of Measurement: 34 cm From Medial Instep 43 cm Ankle Left: Right: Point of Measurement: 10 cm From Medial Instep 24 cm Vascular Assessment Pulses: Dorsalis Pedis Palpable: [Left:Yes] Electronic Signature(s) Signed: 08/12/2020 4:15:45 PM By: Donnamarie Poag Entered By: Donnamarie Poag on 08/12/2020 15:00:37 Shane Bean ([id number]) -------------------------------------------------------------------------------- Multi Wound Chart Details Patient Name: Shane Bean Date of Service: 08/12/2020 2:15 PM Medical Record Number: [id number] Patient Account Number: [id number] Date of Birth/Sex: Jul 22, 1955 (65 y.o. M) Treating RN: Donnamarie Poag Primary Care Janeli Lewison: Reeves Dam Other Clinician: Referring Gavin Telford: Reeves Dam Treating Jefrey Raburn/Extender: Skipper Cliche in Treatment: 12 Vital Signs Height(in): 70 Pulse(bpm): 75 Weight(lbs): 290 Blood Pressure(mmHg): 164/55 Body Mass Index(BMI): 42 Temperature(F): 98.2 Respiratory Rate(breaths/min): 16 Photos: [N/A:N/A] Wound Location: Left, Posterior Lower Leg N/A N/A Wounding Event: Gradually Appeared N/A N/A Primary Etiology: Diabetic Wound/Ulcer of the Lower N/A N/A Extremity Comorbid History: Hypertension, Type II Diabetes, N/A N/A Neuropathy Date Acquired: 04/08/2020 N/A N/A Weeks of Treatment: 12 N/A  N/A Wound Status: Open N/A N/A Measurements L x W x D (cm) 5.4x3x0.2 N/A N/A Area (cm) : 12.723 N/A N/A Volume (cm) : 2.545 N/A N/A % Reduction in Area: -15.70% N/A N/A % Reduction in Volume: -131.40% N/A N/A Classification: Grade 2 N/A N/A Exudate Amount: Large N/A N/A Exudate Type: Serosanguineous N/A N/A Exudate Color: red, brown N/A N/A Granulation Amount: Medium (34-66%) N/A N/A Granulation Quality: Red, Pink N/A N/A Necrotic Amount: Medium (34-66%) N/A N/A Necrotic Tissue: Eschar, Adherent Slough N/A N/A Exposed Structures: Fat Layer (Subcutaneous Tissue): N/A N/A Yes Fascia: No Tendon: No Muscle: No Joint: No Bone: No Epithelialization: Small (1-33%) N/A N/A Treatment Notes Electronic Signature(s) Signed: 08/12/2020 4:15:45 PM By: Donnamarie Poag Entered By: Donnamarie Poag on 08/12/2020 15:15:56 Shane Bean ([id number]) -------------------------------------------------------------------------------- Multi-Disciplinary Care Plan Details Patient Name: Shane Bean Date of  Service: 08/12/2020 2:15 PM Medical Record Number: [id number] Patient Account Number: [id number] Date of Birth/Sex: 21-Jan-1955 (65 y.o. M) Treating RN: Donnamarie Poag Primary Care Tulani Kidney: Reeves Dam Other Clinician: Referring Allaina Brotzman: Reeves Dam Treating Kell Ferris/Extender: Skipper Cliche in Treatment: 12 Active Inactive Wound/Skin Impairment Nursing Diagnoses: Impaired tissue integrity Knowledge deficit related to smoking impact on wound healing Knowledge deficit related to ulceration/compromised skin integrity Goals: Patient/caregiver will verbalize understanding of skin care regimen Date Initiated: 05/18/2020 Date Inactivated: 07/08/2020 Target Resolution Date: 06/19/2020 Goal Status: Met Ulcer/skin breakdown will have a volume reduction of 30% by week 4 Date Initiated: 05/18/2020 Date Inactivated: 07/08/2020 Target Resolution Date: 06/15/2020 Goal Status: Met Ulcer/skin  breakdown will have a volume reduction of 50% by week 8 Date Initiated: 05/18/2020 Date Inactivated: 07/15/2020 Target Resolution Date: 07/13/2020 Goal Status: Unmet Unmet Reason: con't tx Ulcer/skin breakdown will have a volume reduction of 80% by week 12 Date Initiated: 05/18/2020 Target Resolution Date: 08/10/2020 Goal Status: Active Interventions: Assess patient/caregiver ability to obtain necessary supplies Assess patient/caregiver ability to perform ulcer/skin care regimen upon admission and as needed Assess ulceration(s) every visit Notes: Electronic Signature(s) Signed: 08/12/2020 4:15:45 PM By: Donnamarie Poag Entered By: Donnamarie Poag on 08/12/2020 15:15:30 Shane Bean ([id number]) -------------------------------------------------------------------------------- Pain Assessment Details Patient Name: Shane Bean Date of Service: 08/12/2020 2:15 PM Medical Record Number: [id number] Patient Account Number: [id number] Date of Birth/Sex: Oct 17, 1955 (65 y.o. M) Treating RN: Donnamarie Poag Primary Care Braydan Marriott: Reeves Dam Other Clinician: Referring Gerald Kuehl: Reeves Dam Treating Alonso Gapinski/Extender: Skipper Cliche in Treatment: 12 Active Problems Location of Pain Severity and Description of Pain Patient Has Paino Yes Site Locations Pain Location: Generalized Pain, Pain in Ulcers Rate the pain. Current Pain Level: 7 Pain Management and Medication Current Pain Management: Electronic Signature(s) Signed: 08/12/2020 4:15:45 PM By: Donnamarie Poag Entered By: Donnamarie Poag on 08/12/2020 14:55:17 Shane Bean ([id number]) -------------------------------------------------------------------------------- Patient/Caregiver Education Details Patient Name: Shane Bean Date of Service: 08/12/2020 2:15 PM Medical Record Number: [id number] Patient Account Number: [id number] Date of Birth/Gender: [birth year] (65 y.o. M) Treating RN: Donnamarie Poag Primary Care Physician:  Reeves Dam Other Clinician: Referring Physician: Reeves Dam Treating Physician/Extender: Skipper Cliche in Treatment: 12 Education Assessment Education Provided To: Patient Education Topics Provided Basic Hygiene: Wound/Skin Impairment: Electronic Signature(s) Signed: 08/12/2020 4:15:45 PM By: Donnamarie Poag Entered By: Donnamarie Poag on 08/12/2020 15:20:07 Shane Bean ([id number]) -------------------------------------------------------------------------------- Wound Assessment Details Patient Name: Shane Bean Date of Service: 08/12/2020 2:15 PM Medical Record Number: [id number] Patient Account Number: [id number] Date of Birth/Sex: 05-16-1955 (65 y.o. M) Treating RN: Donnamarie Poag Primary Care Alante Weimann: Reeves Dam Other Clinician: Referring Kaven Cumbie: Reeves Dam Treating Juanita Streight/Extender: Jeri Cos Weeks in Treatment: 12 Wound Status Wound Number: 1 Primary Etiology: Diabetic Wound/Ulcer of the Lower Extremity Wound Location: Left, Posterior Lower Leg Wound Status: Open Wounding Event: Gradually Appeared Comorbid History: Hypertension, Type II Diabetes, Neuropathy Date Acquired: 04/08/2020 Weeks Of Treatment: 12 Clustered Wound: No Photos Wound Measurements Length: (cm) 5.4 Width: (cm) 3 Depth: (cm) 0.2 Area: (cm) 12.723 Volume: (cm) 2.545 % Reduction in Area: -15.7% % Reduction in Volume: -131.4% Epithelialization: Small (1-33%) Tunneling: No Undermining: No Wound Description Classification: Grade 2 Exudate Amount: Large Exudate Type: Serosanguineous Exudate Color: red, brown Foul Odor After Cleansing: No Slough/Fibrino Yes Wound Bed Granulation Amount: Medium (34-66%) Exposed Structure Granulation Quality: Red, Pink Fascia Exposed: No Necrotic Amount: Medium (34-66%) Fat Layer (Subcutaneous Tissue) Exposed: Yes Necrotic Quality: Eschar, Adherent Slough Tendon Exposed: No Muscle Exposed: No Joint Exposed:  No Bone Exposed:  No Treatment Notes Wound #1 (Lower Leg) Wound Laterality: Left, Posterior Cleanser Soap and Water Discharge Instruction: Gently cleanse wound with antibacterial soap, rinse and pat dry prior to dressing wounds Peri-Wound Care Triamcinolone Acetonide Cream, 0.1%, 15 (g) tube Shane Bean, Shane Bean ([id number]) Discharge Instruction: peri wound Topical Primary Dressing Hydrofera Blue Ready Transfer Foam, 2.5x2.5 (in/in) Discharge Instruction: Apply Hydrofera Blue Ready to wound bed as directed Secondary Dressing ABD Pad 5x9 (in/in) Discharge Instruction: Cover with ABD pad Secured With Compression Wrap Profore Lite LF 3 Multilayer Compression Bandaging System Discharge Instruction: Apply 3 multi-layer wrap as prescribed. Compression Stockings Add-Ons Electronic Signature(s) Signed: 08/12/2020 4:15:45 PM By: Donnamarie Poag Entered By: Donnamarie Poag on 08/12/2020 14:59:20 Shane Bean ([id number]) -------------------------------------------------------------------------------- [place] Details Patient Name: Shane Bean Date of Service: 08/12/2020 2:15 PM Medical Record Number: [id number] Patient Account Number: [id number] Date of Birth/Sex: 28-Nov-1955 (65 y.o. M) Treating RN: Donnamarie Poag Primary Care Kaislyn Gulas: Reeves Dam Other Clinician: Referring Oluchi Pucci: Reeves Dam Treating Milagros Middendorf/Extender: Skipper Cliche in Treatment: 12 Vital Signs Time Taken: 14:52 Temperature (F): 98.2 Height (in): 70 Pulse (bpm): 75 Weight (lbs): 290 Respiratory Rate (breaths/min): 16 Body Mass Index (BMI): 41.6 Blood Pressure (mmHg): 164/55 Reference Range: 80 - 120 mg / dl Electronic Signature(s) Signed: 08/12/2020 4:15:45 PM By: Donnamarie Poag Entered ByDonnamarie Poag on 08/12/2020 14:55:02

## 2020-08-12 NOTE — Progress Notes (Addendum)
Shane Bean, Shane Bean ([id number]) Visit Report for 08/12/2020 Chief Complaint Document Details Patient Name: Shane Bean, Shane Bean Date of Service: 08/12/2020 2:15 PM Medical Record Number: [id number] Patient Account Number: [id number] Date of Birth/Sex: 19-Nov-1955 (65 y.o. M) Treating RN: Donnamarie Poag Primary Care Provider: Reeves Dam Other Clinician: Referring Provider: Reeves Dam Treating Provider/Extender: Skipper Cliche in Treatment: 12 Information Obtained from: Patient Chief Complaint Left LE Ulcer Electronic Signature(s) Signed: 08/12/2020 3:07:39 PM By: Worthy Keeler PA-C Entered By: Worthy Keeler on 08/12/2020 15:07:39 Shane Bean ([id number]) -------------------------------------------------------------------------------- Debridement Details Patient Name: Shane Bean Date of Service: 08/12/2020 2:15 PM Medical Record Number: [id number] Patient Account Number: [id number] Date of Birth/Sex: 21-Dec-1955 (65 y.o. M) Treating RN: Donnamarie Poag Primary Care Provider: Reeves Dam Other Clinician: Referring Provider: Reeves Dam Treating Provider/Extender: Skipper Cliche in Treatment: 12 Debridement Performed for Wound #1 Left,Posterior Lower Leg Assessment: Performed By: Physician Tommie Sams., PA-C Debridement Type: Debridement Severity of Tissue Pre Debridement: Fat layer exposed Level of Consciousness (Pre- Awake and Alert procedure): Pre-procedure Verification/Time Out Yes - 15:17 Taken: Start Time: 15:17 Total Area Debrided (L x W): 4 (cm) x 2 (cm) = 8 (cm) Tissue and other material Viable, Non-Viable, Slough, Subcutaneous, Slough debrided: Level: Skin/Subcutaneous Tissue Debridement Description: Excisional Instrument: Curette Bleeding: Minimum Hemostasis Achieved: Pressure Response to Treatment: Procedure was tolerated well Level of Consciousness (Post- Awake and Alert procedure): Post Debridement Measurements of Total  Wound Length: (cm) 5.4 Width: (cm) 3 Depth: (cm) 0.2 Volume: (cm) 2.545 Character of Wound/Ulcer Post Debridement: Improved Severity of Tissue Post Debridement: Fat layer exposed Post Procedure Diagnosis Same as Pre-procedure Electronic Signature(s) Signed: 08/12/2020 4:15:45 PM By: Donnamarie Poag Signed: 08/13/2020 5:38:02 PM By: Worthy Keeler PA-C Entered By: Donnamarie Poag on 08/12/2020 15:19:27 Shane Bean ([id number]) -------------------------------------------------------------------------------- HPI Details Patient Name: Shane Bean Date of Service: 08/12/2020 2:15 PM Medical Record Number: [id number] Patient Account Number: [id number] Date of Birth/Sex: [birth year] (65 y.o. M) Treating RN: Donnamarie Poag Primary Care Provider: Reeves Dam Other Clinician: Referring Provider: Reeves Dam Treating Provider/Extender: Skipper Cliche in Treatment: 12 History of Present Illness HPI Description: 05/18/2020 upon evaluation today patient appears for initial evaluation here in clinic concerning issues that he has been having with the wound on his left lateral leg. Fortunately there does not appear to be any signs of obvious an active infection at this time which is great news. With that being said the patient unfortunately is continued to have issues with pain although he tells me it is gotten a lot better. He was on Keflex as well as Bactrim DS which seems to have done a good job there. His most recent hemoglobin A1c was 9.8 that was on 04/14/2020. Subsequently I do feel like that the patient is making progress here. He does have a history of chronic venous insufficiency as well as hypertension. I do not see any need for antibiotics at this point. 5/25; follow-up of the wound on the left posterior calf. This is completely necrotic on the surface. It does not look infected but it is painful. He is a diabetic but I think there is some suggestion here of chronic venous disease as  well. We used Iodoflex under compression last week 6/1; again a completely necrotic surface on this with the wound. We have been using Iodoflex under compression he complains of gnawing pain. He has had wounds previously a lot of this looks like chronic venous disease. 6/8; about two thirds of the  surface of this wound is still covered in a black necrotic eschar. I changed him from Iodoflex to [place] last week because of complaints of pain. He actually was seen by her neurologist this weeko Peripheral neuropathy as a cause of the pain and they changed him from Neurontin to Lyrica but he still has not had any relief. He says that most of the pain however is in his heel not in his wound per se. He is a diabetic 6/15. This is a patient with what looks to be a venous wound on the left lateral lower leg. He is also a diabetic. Currently being worked up for diabetic neuropathy. He complains of pain out of proportion to the size of the wound although to be fair the entire area here was eschared. We have been working to get a viable surface. We are using Sorbact 6/22; fairly painful wound on the left posterior calf. I am assuming this is been venous he is also a diabetic. His ABI in our clinic was noncompressible however he has easily palpable pulses on his feet. He complains of a lot of pain in the wound also of the left heel and the upper left calf. Some of this may be neuropathy. It is led me to discontinue Iodoflex reduce his compression but he still complains of pain in fact he says he could not take a debridement today. When I first saw this it was completely necrotic surface we have got it down to something that looks a reasonably healthy I have been wondering about biopsying this area however he is on Coumadin and with the pain I have put this off. The major question would be an inflammatory ulcer such as pyoderma. The patient is not aware of how this started. He does however have a wound history on  both legs. 6/29; patient presents for 1 week follow-up. He has been using Hydrofera Blue under Kerlix/Coban. He has tolerated this well. He currently denies signs of infection. He also declines debridement today. He he states it is painful and does not want to have this done. 7/6; Patient presents for 1 week follow-up. He has been using Hydrofera Blue under Kerlix/Coban. He denies signs of infection. He declines debridement today. 7/15; patient presents for 1 week follow-up. He has been using Hydrofera Blue under Kerlix/Coban. He denies signs of infection today. He is agreeable to having debridement done today. 7/20; patient presents for 1 week follow-up. He is in good spirits today. He has been using Hydrofera Blue under Kerlix/Coban. He denies signs of infection today. 7/27; patient presents for 1 week follow-up. He is in good spirits today. He has no issues or complaints today. He denies signs of infection. 08/12/2020 upon evaluation today patient appears to be doing better in regard to his wound. He has been tolerating the dressing changes without complication. With that being said he did have arterial studies and does show that he has a TBI on the right of 0.90 and a TBI on the left of 0.76 which is excellent. There is no evidence of arterial compromise at this point. Electronic Signature(s) Signed: 08/13/2020 5:15:11 PM By: Worthy Keeler PA-C Entered By: Worthy Keeler on 08/13/2020 17:15:10 Shane Bean ([id number]) -------------------------------------------------------------------------------- Physical Exam Details Patient Name: Shane Bean Date of Service: 08/12/2020 2:15 PM Medical Record Number: [id number] Patient Account Number: [id number] Date of Birth/Sex: [birth year] (64 y.o. M) Treating RN: Donnamarie Poag Primary Care Provider: Reeves Dam Other Clinician: Referring Provider: Reeves Dam Treating Provider/Extender:  Jeri Cos Weeks in Treatment:  56 Constitutional Well-nourished and well-hydrated in no acute distress. Respiratory normal breathing without difficulty. Psychiatric this patient is able to make decisions and demonstrates good insight into disease process. Alert and Oriented x 3. pleasant and cooperative. Notes Upon inspection patient's wounds did require sharp debridement to clear away some of the necrotic debris including surface slough and biofilm on the wound currently. He tolerated this without complication and postdebridement the wound bed appears to be doing much better. Electronic Signature(s) Signed: 08/13/2020 5:15:30 PM By: Worthy Keeler PA-C Entered By: Worthy Keeler on 08/13/2020 17:15:30 Shane Bean ([id number]) -------------------------------------------------------------------------------- Physician Orders Details Patient Name: Shane Bean Date of Service: 08/12/2020 2:15 PM Medical Record Number: [id number] Patient Account Number: [id number] Date of Birth/Sex: [birth year] (64 y.o. M) Treating RN: Donnamarie Poag Primary Care Provider: Reeves Dam Other Clinician: Referring Provider: Reeves Dam Treating Provider/Extender: Skipper Cliche in Treatment: 12 Verbal / Phone Orders: No Diagnosis Coding ICD-10 Coding Code Description E11.622 Type 2 diabetes mellitus with other skin ulcer I87.2 Venous insufficiency (chronic) (peripheral) 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 layer exposed I10 Essential (primary) hypertension Follow-up Appointments o Return Appointment in 1 week. o Nurse Visit as needed Bathing/ Shower/ Hygiene o May shower with wound dressing protected with water repellent cover or cast protector. - cover dressing during shower-do not get it wet Edema Control - Lymphedema / Segmental Compressive Device / Other o Elevate, Exercise Daily and Avoid Standing for Long Periods of Time. o Elevate legs to the level of the  heart and pump ankles as often as possible o Elevate leg(s) parallel to the floor when sitting. Wound Treatment Wound #1 - Lower Leg Wound Laterality: Left, Posterior Cleanser: Soap and Water 1 x Per Week/30 Days Discharge Instructions: Gently cleanse wound with antibacterial soap, rinse and pat dry prior to dressing wounds Peri-Wound Care: Triamcinolone Acetonide Cream, 0.1%, 15 (g) tube 1 x Per Week/30 Days Discharge Instructions: peri wound Primary Dressing: Hydrofera Blue Ready Transfer Foam, 2.5x2.5 (in/in) 1 x Per Week/30 Days Discharge Instructions: Apply Hydrofera Blue Ready to wound bed as directed Secondary Dressing: ABD Pad 5x9 (in/in) 1 x Per Week/30 Days Discharge Instructions: Cover with ABD pad Compression Wrap: Profore Lite LF 3 Multilayer Compression Bandaging System 1 x Per Week/30 Days Discharge Instructions: Apply 3 multi-layer wrap as prescribed. Electronic Signature(s) Signed: 08/12/2020 4:15:45 PM By: Donnamarie Poag Signed: 08/13/2020 5:38:02 PM By: Worthy Keeler PA-C Entered By: Donnamarie Poag on 08/12/2020 15:17:08 Shane Bean ([id number]) -------------------------------------------------------------------------------- Problem List Details Patient Name: Shane Bean Date of Service: 08/12/2020 2:15 PM Medical Record Number: [id number] Patient Account Number: [id number] Date of Birth/Sex: 09/28/1955 (64 y.o. M) Treating RN: Donnamarie Poag Primary Care Provider: Reeves Dam Other Clinician: Referring Provider: Reeves Dam Treating Provider/Extender: Jeri Cos Weeks in Treatment: 12 Active Problems ICD-10 Encounter Code Description Active Date MDM Diagnosis E11.622 Type 2 diabetes mellitus with other skin ulcer 05/18/2020 No Yes I87.2 Venous insufficiency (chronic) (peripheral) 05/18/2020 No Yes 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 layer 05/18/2020 No Yes exposed [place] (primary) hypertension 05/18/2020  No Yes Inactive Problems Resolved Problems Electronic Signature(s) Signed: 08/12/2020 3:07:30 PM By: Worthy Keeler PA-C Entered By: Worthy Keeler on 08/12/2020 15:07:29 Shane Bean ([id number]) -------------------------------------------------------------------------------- Progress Note Details Patient Name: Shane Bean Date of Service: 08/12/2020 2:15 PM Medical Record Number: [id number] Patient Account Number: [id number] Date  of Birth/Sex: 1955/09/08 (64 y.o. M) Treating RN: Donnamarie Poag Primary Care Provider: Reeves Dam Other Clinician: Referring Provider: Reeves Dam Treating Provider/Extender: Skipper Cliche in Treatment: 12 Subjective Chief Complaint Information obtained from Patient Left LE Ulcer History of Present Illness (HPI) 05/18/2020 upon evaluation today patient appears for initial evaluation here in clinic concerning issues that he has been having with the wound on his left lateral leg. Fortunately there does not appear to be any signs of obvious an active infection at this time which is great news. With that being said the patient unfortunately is continued to have issues with pain although he tells me it is gotten a lot better. He was on Keflex as well as Bactrim DS which seems to have done a good job there. His most recent hemoglobin A1c was 9.8 that was on 04/14/2020. Subsequently I do feel like that the patient is making progress here. He does have a history of chronic venous insufficiency as well as hypertension. I do not see any need for antibiotics at this point. 5/25; follow-up of the wound on the left posterior calf. This is completely necrotic on the surface. It does not look infected but it is painful. He is a diabetic but I think there is some suggestion here of chronic venous disease as well. We used Iodoflex under compression last week 6/1; again a completely necrotic surface on this with the wound. We have been using Iodoflex under  compression he complains of gnawing pain. He has had wounds previously a lot of this looks like chronic venous disease. 6/8; about two thirds of the surface of this wound is still covered in a black necrotic eschar. I changed him from Iodoflex to [place] last week because of complaints of pain. He actually was seen by her neurologist this weeko Peripheral neuropathy as a cause of the pain and they changed him from Neurontin to Lyrica but he still has not had any relief. He says that most of the pain however is in his heel not in his wound per se. He is a diabetic 6/15. This is a patient with what looks to be a venous wound on the left lateral lower leg. He is also a diabetic. Currently being worked up for diabetic neuropathy. He complains of pain out of proportion to the size of the wound although to be fair the entire area here was eschared. We have been working to get a viable surface. We are using Sorbact 6/22; fairly painful wound on the left posterior calf. I am assuming this is been venous he is also a diabetic. His ABI in our clinic was noncompressible however he has easily palpable pulses on his feet. He complains of a lot of pain in the wound also of the left heel and the upper left calf. Some of this may be neuropathy. It is led me to discontinue Iodoflex reduce his compression but he still complains of pain in fact he says he could not take a debridement today. When I first saw this it was completely necrotic surface we have got it down to something that looks a reasonably healthy I have been wondering about biopsying this area however he is on Coumadin and with the pain I have put this off. The major question would be an inflammatory ulcer such as pyoderma. The patient is not aware of how this started. He does however have a wound history on both legs. 6/29; patient presents for 1 week follow-up. He has been using Hydrofera  Blue under Kerlix/Coban. He has tolerated this well. He currently  denies signs of infection. He also declines debridement today. He he states it is painful and does not want to have this done. 7/6; Patient presents for 1 week follow-up. He has been using Hydrofera Blue under Kerlix/Coban. He denies signs of infection. He declines debridement today. 7/15; patient presents for 1 week follow-up. He has been using Hydrofera Blue under Kerlix/Coban. He denies signs of infection today. He is agreeable to having debridement done today. 7/20; patient presents for 1 week follow-up. He is in good spirits today. He has been using Hydrofera Blue under Kerlix/Coban. He denies signs of infection today. 7/27; patient presents for 1 week follow-up. He is in good spirits today. He has no issues or complaints today. He denies signs of infection. 08/12/2020 upon evaluation today patient appears to be doing better in regard to his wound. He has been tolerating the dressing changes without complication. With that being said he did have arterial studies and does show that he has a TBI on the right of 0.90 and a TBI on the left of 0.76 which is excellent. There is no evidence of arterial compromise at this point. Objective Constitutional Well-nourished and well-hydrated in no acute distress. Shane Bean, Shane Bean ([id number]) Vitals Time Taken: 2:52 PM, Height: 70 in, Weight: 290 lbs, BMI: 41.6, Temperature: 98.2 F, Pulse: 75 bpm, Respiratory Rate: 16 breaths/min, Blood Pressure: 164/55 mmHg. Respiratory normal breathing without difficulty. Psychiatric this patient is able to make decisions and demonstrates good insight into disease process. Alert and Oriented x 3. pleasant and cooperative. General Notes: Upon inspection patient's wounds did require sharp debridement to clear away some of the necrotic debris including surface slough and biofilm on the wound currently. He tolerated this without complication and postdebridement the wound bed appears to be doing much  better. Integumentary (Hair, Skin) Wound #1 status is Open. Original cause of wound was Gradually Appeared. The date acquired was: 04/08/2020. The wound has been in treatment 12 weeks. The wound is located on the Left,Posterior Lower Leg. The wound measures 5.4cm length x 3cm width x 0.2cm depth; 12.723cm^2 area and 2.545cm^3 volume. There is Fat Layer (Subcutaneous Tissue) exposed. There is no tunneling or undermining noted. There is a large amount of serosanguineous drainage noted. There is medium (34-66%) red, pink granulation within the wound bed. There is a medium (34-66%) amount of necrotic tissue within the wound bed including Eschar and Adherent Slough. Assessment Active Problems ICD-10 Type 2 diabetes mellitus with other skin ulcer Venous insufficiency (chronic) (peripheral) Non-pressure chronic ulcer of other part of left lower leg with fat layer exposed Essential (primary) hypertension Procedures Wound #1 Pre-procedure diagnosis of Wound #1 is a Diabetic Wound/Ulcer of the Lower Extremity located on the Left,Posterior Lower Leg .Severity of Tissue Pre Debridement is: Fat layer exposed. There was a Excisional Skin/Subcutaneous Tissue Debridement with a total area of 8 sq cm performed by Tommie Sams., PA-C. With the following instrument(s): Curette to remove Viable and Non-Viable tissue/material. Material removed includes Subcutaneous Tissue and Slough and. A time out was conducted at 15:17, prior to the start of the procedure. A Minimum amount of bleeding was controlled with Pressure. The procedure was tolerated well. Post Debridement Measurements: 5.4cm length x 3cm width x 0.2cm depth; 2.545cm^3 volume. Character of Wound/Ulcer Post Debridement is improved. Severity of Tissue Post Debridement is: Fat layer exposed. Post procedure Diagnosis Wound #1: Same as Pre-Procedure Pre-procedure diagnosis of Wound #1 is a Diabetic  Wound/Ulcer of the Lower Extremity located on the  Left,Posterior Lower Leg . There was a Three Layer Compression Therapy Procedure by Donnamarie Poag, RN. Post procedure Diagnosis Wound #1: Same as Pre-Procedure Plan Follow-up Appointments: Return Appointment in 1 week. Nurse Visit as needed Bathing/ Shower/ Hygiene: May shower with wound dressing protected with water repellent cover or cast protector. - cover dressing during shower-do not get it wet Edema Control - Lymphedema / Segmental Compressive Device / Other: Elevate, Exercise Daily and Avoid Standing for Long Periods of Time. Elevate legs to the level of the heart and pump ankles as often as possible Elevate leg(s) parallel to the floor when sitting. WOUND #1: - Lower Leg Wound Laterality: Left, Posterior Cleanser: Soap and Water 1 x Per Week/30 Days Shane Bean, Shane Bean ([id number]) Discharge Instructions: Gently cleanse wound with antibacterial soap, rinse and pat dry prior to dressing wounds Peri-Wound Care: Triamcinolone Acetonide Cream, 0.1%, 15 (g) tube 1 x Per Week/30 Days Discharge Instructions: peri wound Primary Dressing: Hydrofera Blue Ready Transfer Foam, 2.5x2.5 (in/in) 1 x Per Week/30 Days Discharge Instructions: Apply Hydrofera Blue Ready to wound bed as directed Secondary Dressing: ABD Pad 5x9 (in/in) 1 x Per Week/30 Days Discharge Instructions: Cover with ABD pad Compression Wrap: Profore Lite LF 3 Multilayer Compression Bandaging System 1 x Per Week/30 Days Discharge Instructions: Apply 3 multi-layer wrap as prescribed. 1. Upon inspection patient overall seems to be doing quite well and I do think postdebridement he was doing even better. I do believe the [facility] is doing a good job. 2. I am also can recommend an ABD pad to cover which has been doing great and then subsequently the 3 layer compression wrap. We will see patient back for reevaluation in 1 week here in the clinic. If anything worsens or changes patient will contact our office for  additional recommendations. Electronic Signature(s) Signed: 08/13/2020 5:15:54 PM By: Worthy Keeler PA-C Entered By: Worthy Keeler on 08/13/2020 17:15:54 Shane Bean ([id number]) -------------------------------------------------------------------------------- SuperBill Details Patient Name: Shane Bean Date of Service: 08/12/2020 Medical Record Number: [id number] Patient Account Number: [id number] Date of Birth/Sex: February 20, 1955 (64 y.o. M) Treating RN: Donnamarie Poag Primary Care Provider: Reeves Dam Other Clinician: Referring Provider: Reeves Dam Treating Provider/Extender: Jeri Cos Weeks in Treatment: 12 Diagnosis Coding ICD-10 Codes Code Description E11.622 Type 2 diabetes mellitus with other skin ulcer I87.2 Venous insufficiency (chronic) (peripheral) 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 layer exposed I10 Essential (primary) hypertension Facility Procedures CPT4 Code: [id number] Description: [id number] - DEB SUBQ TISSUE 20 SQ CM/< Modifier: Quantity: 1 CPT4 Code: Description: ICD-10 Diagnosis Description 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 layer Modifier: exposed Quantity: Physician Procedures CPT4 Code: [id number] Description: 11042 - WC PHYS SUBQ TISS 20 SQ CM Modifier: Quantity: 1 CPT4 Code: Description: ICD-10 Diagnosis Description 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 layer Modifier: exposed Quantity: Electronic Signature(s) Signed: 08/13/2020 5:23:18 PM By: Worthy Keeler PA-C Previous Signature: 08/12/2020 4:15:45 PM Version By: Donnamarie Poag Entered By: Worthy Keeler on 08/13/2020 17:23:17

## 2020-08-19 ENCOUNTER — Other Ambulatory Visit: Payer: Self-pay

## 2020-08-19 ENCOUNTER — Encounter (HOSPITAL_BASED_OUTPATIENT_CLINIC_OR_DEPARTMENT_OTHER): Payer: Medicare (Managed Care) | Admitting: Internal Medicine

## 2020-08-19 DIAGNOSIS — L97822 Non-pressure chronic ulcer of other part of left lower leg with fat layer exposed: Secondary | ICD-10-CM

## 2020-08-19 DIAGNOSIS — E11622 Type 2 diabetes mellitus with other skin ulcer: Secondary | ICD-10-CM | POA: Diagnosis not present

## 2020-08-19 NOTE — Progress Notes (Signed)
MERVEL, SPEYRER ([id number]) Visit Report for 08/19/2020 Chief Complaint Document Details Patient Name: Shane Bean, Shane Bean Date of Service: 08/19/2020 3:00 PM Medical Record Number: [id number] Patient Account Number: [id number] Date of Birth/Sex: 07/05/1955 (65 y.o. M) Treating RN: Donnamarie Poag Primary Care Provider: Reeves Dam Other Clinician: Referring Provider: Reeves Dam Treating Provider/Extender: Yaakov Guthrie in Treatment: 13 Information Obtained from: Patient Chief Complaint Left LE Ulcer Electronic Signature(s) Signed: 08/19/2020 3:31:24 PM By: Kalman Shan DO Entered By: Kalman Shan on 08/19/2020 15:28:32 Shane Bean ([id number]) -------------------------------------------------------------------------------- Debridement Details Patient Name: Shane Bean Date of Service: 08/19/2020 3:00 PM Medical Record Number: [id number] Patient Account Number: [id number] Date of Birth/Sex: [birth year] (65 y.o. M) Treating RN: Donnamarie Poag Primary Care Provider: Reeves Dam Other Clinician: Referring Provider: Reeves Dam Treating Provider/Extender: Yaakov Guthrie in Treatment: 13 Debridement Performed for Wound #1 Left,Posterior Lower Leg Assessment: Performed By: Physician Kalman Shan, MD Debridement Type: Debridement Severity of Tissue Pre Debridement: Fat layer exposed Level of Consciousness (Pre- Awake and Alert procedure): Pre-procedure Verification/Time Out Yes - 15:22 Taken: Start Time: 15:22 Pain Control: Lidocaine Total Area Debrided (L x W): 3 (cm) x 4 (cm) = 12 (cm) Tissue and other material Viable, Non-Viable, Slough, Subcutaneous, Biofilm, Slough debrided: Level: Skin/Subcutaneous Tissue Debridement Description: Excisional Instrument: Curette Bleeding: Minimum Hemostasis Achieved: Pressure End Time: 15:25 Response to Treatment: Procedure was tolerated well Level of Consciousness (Post- Awake and  Alert procedure): Post Debridement Measurements of Total Wound Length: (cm) 5 Width: (cm) 2.4 Depth: (cm) 0.2 Volume: (cm) 1.885 Character of Wound/Ulcer Post Debridement: Improved Severity of Tissue Post Debridement: Fat layer exposed Post Procedure Diagnosis Same as Pre-procedure Electronic Signature(s) Signed: 08/19/2020 3:31:24 PM By: Kalman Shan DO Signed: 08/19/2020 3:37:06 PM By: Donnamarie Poag Entered By: Donnamarie Poag on 08/19/2020 15:25:00 Shane Bean ([id number]) -------------------------------------------------------------------------------- HPI Details Patient Name: Shane Bean Date of Service: 08/19/2020 3:00 PM Medical Record Number: [id number] Patient Account Number: [id number] Date of Birth/Sex: 10-26-1955 (65 y.o. M) Treating RN: Donnamarie Poag Primary Care Provider: Reeves Dam Other Clinician: Referring Provider: Reeves Dam Treating Provider/Extender: Yaakov Guthrie in Treatment: 13 History of Present Illness HPI Description: 05/18/2020 upon evaluation today patient appears for initial evaluation here in clinic concerning issues that he has been having with the wound on his left lateral leg. Fortunately there does not appear to be any signs of obvious an active infection at this time which is great news. With that being said the patient unfortunately is continued to have issues with pain although he tells me it is gotten a lot better. He was on Keflex as well as Bactrim DS which seems to have done a good job there. His most recent hemoglobin A1c was 9.8 that was on 04/14/2020. Subsequently I do feel like that the patient is making progress here. He does have a history of chronic venous insufficiency as well as hypertension. I do not see any need for antibiotics at this point. 5/25; follow-up of the wound on the left posterior calf. This is completely necrotic on the surface. It does not look infected but it is painful. He is a diabetic but I  think there is some suggestion here of chronic venous disease as well. We used Iodoflex under compression last week 6/1; again a completely necrotic surface on this with the wound. We have been using Iodoflex under compression he complains of gnawing pain. He has had wounds previously a lot of this looks like chronic venous disease. 6/8; about two  thirds of the surface of this wound is still covered in a black necrotic eschar. I changed him from Iodoflex to [place] last week because of complaints of pain. He actually was seen by her neurologist this weeko Peripheral neuropathy as a cause of the pain and they changed him from Neurontin to Lyrica but he still has not had any relief. He says that most of the pain however is in his heel not in his wound per se. He is a diabetic 6/15. This is a patient with what looks to be a venous wound on the left lateral lower leg. He is also a diabetic. Currently being worked up for diabetic neuropathy. He complains of pain out of proportion to the size of the wound although to be fair the entire area here was eschared. We have been working to get a viable surface. We are using Sorbact 6/22; fairly painful wound on the left posterior calf. I am assuming this is been venous he is also a diabetic. His ABI in our clinic was noncompressible however he has easily palpable pulses on his feet. He complains of a lot of pain in the wound also of the left heel and the upper left calf. Some of this may be neuropathy. It is led me to discontinue Iodoflex reduce his compression but he still complains of pain in fact he says he could not take a debridement today. When I first saw this it was completely necrotic surface we have got it down to something that looks a reasonably healthy I have been wondering about biopsying this area however he is on Coumadin and with the pain I have put this off. The major question would be an inflammatory ulcer such as pyoderma. The patient is not  aware of how this started. He does however have a wound history on both legs. 6/29; patient presents for 1 week follow-up. He has been using Hydrofera Blue under Kerlix/Coban. He has tolerated this well. He currently denies signs of infection. He also declines debridement today. He he states it is painful and does not want to have this done. 7/6; Patient presents for 1 week follow-up. He has been using Hydrofera Blue under Kerlix/Coban. He denies signs of infection. He declines debridement today. 7/15; patient presents for 1 week follow-up. He has been using Hydrofera Blue under Kerlix/Coban. He denies signs of infection today. He is agreeable to having debridement done today. 7/20; patient presents for 1 week follow-up. He is in good spirits today. He has been using Hydrofera Blue under Kerlix/Coban. He denies signs of infection today. 7/27; patient presents for 1 week follow-up. He is in good spirits today. He has no issues or complaints today. He denies signs of infection. 08/12/2020 upon evaluation today patient appears to be doing better in regard to his wound. He has been tolerating the dressing changes without complication. With that being said he did have arterial studies and does show that he has a TBI on the right of 0.90 and a TBI on the left of 0.76 which is excellent. There is no evidence of arterial compromise at this point. 8/17; patient presents for 1 week follow-up. He has no issues or complaints today. He denies signs of infection. Electronic Signature(s) Signed: 08/19/2020 3:31:24 PM By: Kalman Shan DO Entered By: Kalman Shan on 08/19/2020 15:28:51 Shane Bean ([id number]) -------------------------------------------------------------------------------- Physical Exam Details Patient Name: Shane Bean Date of Service: 08/19/2020 3:00 PM Medical Record Number: [id number] Patient Account Number: [id number] Date of Birth/Sex: 1955/11/08 (  65 y.o. M) Treating  RN: Donnamarie Poag Primary Care Provider: Reeves Dam Other Clinician: Referring Provider: Reeves Dam Treating Provider/Extender: Yaakov Guthrie in Treatment: 13 Constitutional . Cardiovascular . Psychiatric . Notes Left lower extremity: To the posterior aspect there is an open wound with nonviable tissue and granulation tissue present. No signs of infection. Good edema control Electronic Signature(s) Signed: 08/19/2020 3:31:24 PM By: Kalman Shan DO Entered By: Kalman Shan on 08/19/2020 15:30:07 Shane Bean ([id number]) -------------------------------------------------------------------------------- Physician Orders Details Patient Name: Shane Bean Date of Service: 08/19/2020 3:00 PM Medical Record Number: [id number] Patient Account Number: [id number] Date of Birth/Sex: [birth year] (64 y.o. M) Treating RN: Donnamarie Poag Primary Care Provider: Reeves Dam Other Clinician: Referring Provider: Reeves Dam Treating Provider/Extender: Yaakov Guthrie in Treatment: 72 Verbal / Phone Orders: No Diagnosis Coding Follow-up Appointments o Return Appointment in 1 week. o Nurse Visit as needed Bathing/ Shower/ Hygiene o May shower with wound dressing protected with water repellent cover or cast protector. - cover dressing during shower-do not get it wet Edema Control - Lymphedema / Segmental Compressive Device / Other o Elevate, Exercise Daily and Avoid Standing for Long Periods of Time. o Elevate legs to the level of the heart and pump ankles as often as possible o Elevate leg(s) parallel to the floor when sitting. o DO YOUR BEST to sleep in the bed at night. DO NOT sleep in your recliner. Long hours of sitting in a recliner leads to swelling of the legs and/or potential wounds on your backside. Wound Treatment Wound #1 - Lower Leg Wound Laterality: Left, Posterior Cleanser: Soap and Water 1 x Per Week/30 Days Discharge  Instructions: Gently cleanse wound with antibacterial soap, rinse and pat dry prior to dressing wounds Peri-Wound Care: Triamcinolone Acetonide Cream, 0.1%, 15 (g) tube 1 x Per Week/30 Days Discharge Instructions: peri wound Topical: Santyl Collagenase Ointment, 30 (gm), tube 1 x Per Week/30 Days Discharge Instructions: Apply to wound in clinic only. Primary Dressing: Hydrofera Blue Ready Transfer Foam, 2.5x2.5 (in/in) 1 x Per Week/30 Days Discharge Instructions: Apply Hydrofera Blue Ready to wound bed as directed Secondary Dressing: ABD Pad 5x9 (in/in) 1 x Per Week/30 Days Discharge Instructions: Cover with ABD pad Compression Wrap: Profore Lite LF 3 Multilayer Compression Bandaging System 1 x Per Week/30 Days Discharge Instructions: Apply 3 multi-layer wrap as prescribed. Electronic Signature(s) Signed: 08/19/2020 3:31:24 PM By: Kalman Shan DO Signed: 08/19/2020 3:37:06 PM By: Donnamarie Poag Entered By: Donnamarie Poag on 08/19/2020 15:21:54 Shane Bean ([id number]) -------------------------------------------------------------------------------- Problem List Details Patient Name: Shane Bean Date of Service: 08/19/2020 3:00 PM Medical Record Number: [id number] Patient Account Number: [id number] Date of Birth/Sex: 15-Sep-1955 (64 y.o. M) Treating RN: Donnamarie Poag Primary Care Provider: Reeves Dam Other Clinician: Referring Provider: Reeves Dam Treating Provider/Extender: Yaakov Guthrie in Treatment: 13 Active Problems ICD-10 Encounter Code Description Active Date MDM Diagnosis E11.622 Type 2 diabetes mellitus with other skin ulcer 05/18/2020 No Yes I87.2 Venous insufficiency (chronic) (peripheral) 05/18/2020 No Yes 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 layer 05/18/2020 No Yes exposed [place] (primary) hypertension 05/18/2020 No Yes Inactive Problems Resolved Problems Electronic Signature(s) Signed: 08/19/2020 3:31:24  PM By: Kalman Shan DO Entered By: Kalman Shan on 08/19/2020 15:28:17 Shane Bean ([id number]) -------------------------------------------------------------------------------- Progress Note Details Patient Name: Shane Bean Date of Service: 08/19/2020 3:00 PM Medical Record Number: [id number] Patient Account Number: [id number] Date of Birth/Sex: 12-Jan-1955 (64 y.o. M) Treating RN: Donnamarie Poag Primary  Care Provider: Reeves Dam Other Clinician: Referring Provider: Reeves Dam Treating Provider/Extender: Yaakov Guthrie in Treatment: 13 Subjective Chief Complaint Information obtained from Patient Left LE Ulcer History of Present Illness (HPI) 05/18/2020 upon evaluation today patient appears for initial evaluation here in clinic concerning issues that he has been having with the wound on his left lateral leg. Fortunately there does not appear to be any signs of obvious an active infection at this time which is great news. With that being said the patient unfortunately is continued to have issues with pain although he tells me it is gotten a lot better. He was on Keflex as well as Bactrim DS which seems to have done a good job there. His most recent hemoglobin A1c was 9.8 that was on 04/14/2020. Subsequently I do feel like that the patient is making progress here. He does have a history of chronic venous insufficiency as well as hypertension. I do not see any need for antibiotics at this point. 5/25; follow-up of the wound on the left posterior calf. This is completely necrotic on the surface. It does not look infected but it is painful. He is a diabetic but I think there is some suggestion here of chronic venous disease as well. We used Iodoflex under compression last week 6/1; again a completely necrotic surface on this with the wound. We have been using Iodoflex under compression he complains of gnawing pain. He has had wounds previously a lot of this looks  like chronic venous disease. 6/8; about two thirds of the surface of this wound is still covered in a black necrotic eschar. I changed him from Iodoflex to [place] last week because of complaints of pain. He actually was seen by her neurologist this weeko Peripheral neuropathy as a cause of the pain and they changed him from Neurontin to Lyrica but he still has not had any relief. He says that most of the pain however is in his heel not in his wound per se. He is a diabetic 6/15. This is a patient with what looks to be a venous wound on the left lateral lower leg. He is also a diabetic. Currently being worked up for diabetic neuropathy. He complains of pain out of proportion to the size of the wound although to be fair the entire area here was eschared. We have been working to get a viable surface. We are using Sorbact 6/22; fairly painful wound on the left posterior calf. I am assuming this is been venous he is also a diabetic. His ABI in our clinic was noncompressible however he has easily palpable pulses on his feet. He complains of a lot of pain in the wound also of the left heel and the upper left calf. Some of this may be neuropathy. It is led me to discontinue Iodoflex reduce his compression but he still complains of pain in fact he says he could not take a debridement today. When I first saw this it was completely necrotic surface we have got it down to something that looks a reasonably healthy I have been wondering about biopsying this area however he is on Coumadin and with the pain I have put this off. The major question would be an inflammatory ulcer such as pyoderma. The patient is not aware of how this started. He does however have a wound history on both legs. 6/29; patient presents for 1 week follow-up. He has been using Hydrofera Blue under Kerlix/Coban. He has tolerated this well. He currently denies  signs of infection. He also declines debridement today. He he states it is painful  and does not want to have this done. 7/6; Patient presents for 1 week follow-up. He has been using Hydrofera Blue under Kerlix/Coban. He denies signs of infection. He declines debridement today. 7/15; patient presents for 1 week follow-up. He has been using Hydrofera Blue under Kerlix/Coban. He denies signs of infection today. He is agreeable to having debridement done today. 7/20; patient presents for 1 week follow-up. He is in good spirits today. He has been using Hydrofera Blue under Kerlix/Coban. He denies signs of infection today. 7/27; patient presents for 1 week follow-up. He is in good spirits today. He has no issues or complaints today. He denies signs of infection. 08/12/2020 upon evaluation today patient appears to be doing better in regard to his wound. He has been tolerating the dressing changes without complication. With that being said he did have arterial studies and does show that he has a TBI on the right of 0.90 and a TBI on the left of 0.76 which is excellent. There is no evidence of arterial compromise at this point. 8/17; patient presents for 1 week follow-up. He has no issues or complaints today. He denies signs of infection. Patient History Information obtained from Patient. Social History Never smoker, Marital Status - Single, Alcohol Use - Moderate, Drug Use - No History, Caffeine Use - Never. Medical History Eyes Denies history of Cataracts, Glaucoma, Optic Neuritis Ear/Nose/Mouth/Throat Denies history of Chronic sinus problems/congestion Proby, Aryn V. ([id number]) Hematologic/Lymphatic Denies history of Anemia, Hemophilia, Human Immunodeficiency Virus, Lymphedema Respiratory Denies history of Aspiration, Asthma, Chronic Obstructive Pulmonary Disease (COPD), Pneumothorax, Sleep Apnea, Tuberculosis Cardiovascular Patient has history of Hypertension Denies history of Angina, Arrhythmia, Congestive Heart Failure, Coronary Artery Disease, Deep Vein Thrombosis,  Hypotension, Myocardial Infarction, Peripheral Arterial Disease, Peripheral Venous Disease, Phlebitis, Vasculitis Gastrointestinal Denies history of Cirrhosis , Colitis, Crohn s, Hepatitis A, Hepatitis B Endocrine Patient has history of Type II Diabetes Denies history of Type I Diabetes Genitourinary Denies history of End Stage Renal Disease Immunological Denies history of Lupus Erythematosus, Raynaud s, Scleroderma Integumentary (Skin) Denies history of History of Burn, History of pressure wounds Musculoskeletal Denies history of Gout, Rheumatoid Arthritis, Osteoarthritis, Osteomyelitis Neurologic Patient has history of Neuropathy Denies history of Dementia, Quadriplegia, Paraplegia, Seizure Disorder Oncologic Denies history of Received Chemotherapy, Received Radiation Psychiatric Denies history of Anorexia/bulimia, Confinement Anxiety Objective Constitutional Vitals Time Taken: 3:03 PM, Height: 70 in, Weight: 290 lbs, BMI: 41.6, Temperature: 98.4 F, Pulse: 65 bpm, Respiratory Rate: 16 breaths/min, Blood Pressure: 106/58 mmHg. General Notes: Left lower extremity: To the posterior aspect there is an open wound with nonviable tissue and granulation tissue present. No signs of infection. Good edema control Integumentary (Hair, Skin) Wound #1 status is Open. Original cause of wound was Gradually Appeared. The date acquired was: 04/08/2020. The wound has been in treatment 13 weeks. The wound is located on the Left,Posterior Lower Leg. The wound measures 5cm length x 2.4cm width x 0.2cm depth; 9.425cm^2 area and 1.885cm^3 volume. There is Fat Layer (Subcutaneous Tissue) exposed. There is no tunneling or undermining noted. There is a large amount of serosanguineous drainage noted. There is medium (34-66%) red, pink granulation within the wound bed. There is a medium (34-66%) amount of necrotic tissue within the wound bed including Eschar and Adherent Slough. Assessment Active  Problems ICD-10 Type 2 diabetes mellitus with other skin ulcer Venous insufficiency (chronic) (peripheral) Non-pressure chronic ulcer of other part of left  lower leg with fat layer exposed Ess[place] (primary) hypertension Emmons, Keishaun V. ([id number]) Patient's wound has shown improvement in size and appearance since last clinic visit. I debrided nonviable tissue. He has mostly granulation tissue present. No signs of infection. I recommended continuing Santyl with Hydrofera Blue under 3 layer compression as he has tolerated this well. Follow-up in 1 week Procedures Wound #1 Pre-procedure diagnosis of Wound #1 is a Diabetic Wound/Ulcer of the Lower Extremity located on the Left,Posterior Lower Leg .Severity of Tissue Pre Debridement is: Fat layer exposed. There was a Excisional Skin/Subcutaneous Tissue Debridement with a total area of 12 sq cm performed by Kalman Shan, MD. With the following instrument(s): Curette to remove Viable and Non-Viable tissue/material. Material removed includes Subcutaneous Tissue, Slough, and Biofilm after achieving pain control using Lidocaine. A time out was conducted at 15:22, prior to the start of the procedure. A Minimum amount of bleeding was controlled with Pressure. The procedure was tolerated well. Post Debridement Measurements: 5cm length x 2.4cm width x 0.2cm depth; 1.885cm^3 volume. Character of Wound/Ulcer Post Debridement is improved. Severity of Tissue Post Debridement is: Fat layer exposed. Post procedure Diagnosis Wound #1: Same as Pre-Procedure Pre-procedure diagnosis of Wound #1 is a Diabetic Wound/Ulcer of the Lower Extremity located on the Left,Posterior Lower Leg . There was a Three Layer Compression Therapy Procedure by Donnamarie Poag, RN. Post procedure Diagnosis Wound #1: Same as Pre-Procedure Plan Follow-up Appointments: Return Appointment in 1 week. Nurse Visit as needed Bathing/ Shower/ Hygiene: May shower with wound dressing  protected with water repellent cover or cast protector. - cover dressing during shower-do not get it wet Edema Control - Lymphedema / Segmental Compressive Device / Other: Elevate, Exercise Daily and Avoid Standing for Long Periods of Time. Elevate legs to the level of the heart and pump ankles as often as possible Elevate leg(s) parallel to the floor when sitting. DO YOUR BEST to sleep in the bed at night. DO NOT sleep in your recliner. Long hours of sitting in a recliner leads to swelling of the legs and/or potential wounds on your backside. WOUND #1: - Lower Leg Wound Laterality: Left, Posterior Cleanser: Soap and Water 1 x Per Week/30 Days Discharge Instructions: Gently cleanse wound with antibacterial soap, rinse and pat dry prior to dressing wounds Peri-Wound Care: Triamcinolone Acetonide Cream, 0.1%, 15 (g) tube 1 x Per Week/30 Days Discharge Instructions: peri wound Topical: Santyl Collagenase Ointment, 30 (gm), tube 1 x Per Week/30 Days Discharge Instructions: Apply to wound in clinic only. Primary Dressing: Hydrofera Blue Ready Transfer Foam, 2.5x2.5 (in/in) 1 x Per Week/30 Days Discharge Instructions: Apply Hydrofera Blue Ready to wound bed as directed Secondary Dressing: ABD Pad 5x9 (in/in) 1 x Per Week/30 Days Discharge Instructions: Cover with ABD pad Compression Wrap: Profore Lite LF 3 Multilayer Compression Bandaging System 1 x Per Week/30 Days Discharge Instructions: Apply 3 multi-layer wrap as prescribed. 1. In office sharp debridement 2. Santyl and Hydrofera Blue under 3 layer compression 3. Follow-up in 1 week Electronic Signature(s) Signed: 08/19/2020 3:31:24 PM By: Kalman Shan DO Entered By: Kalman Shan on 08/19/2020 15:30:54 Shane Bean ([id number]) -------------------------------------------------------------------------------- ROS/PFSH Details Patient Name: Shane Bean Date of Service: 08/19/2020 3:00 PM Medical Record Number:  [id number] Patient Account Number: [id number] Date of Birth/Sex: [birth year] (64 y.o. M) Treating RN: Donnamarie Poag Primary Care Provider: Reeves Dam Other Clinician: Referring Provider: Reeves Dam Treating Provider/Extender: Yaakov Guthrie in Treatment: 13 Information Obtained From Patient Eyes Medical History: Negative for: Cataracts; Glaucoma;  Optic Neuritis Ear/Nose/Mouth/Throat Medical History: Negative for: Chronic sinus problems/congestion Hematologic/Lymphatic Medical History: Negative for: Anemia; Hemophilia; Human Immunodeficiency Virus; Lymphedema Respiratory Medical History: Negative for: Aspiration; Asthma; Chronic Obstructive Pulmonary Disease (COPD); Pneumothorax; Sleep Apnea; Tuberculosis Cardiovascular Medical History: Positive for: Hypertension Negative for: Angina; Arrhythmia; Congestive Heart Failure; Coronary Artery Disease; Deep Vein Thrombosis; Hypotension; Myocardial Infarction; Peripheral Arterial Disease; Peripheral Venous Disease; Phlebitis; Vasculitis Gastrointestinal Medical History: Negative for: Cirrhosis ; Colitis; Crohnos; Hepatitis A; Hepatitis B Endocrine Medical History: Positive for: Type II Diabetes Negative for: Type I Diabetes Time with diabetes: 3 Treated with: Oral agents Blood sugar tested every day: No Genitourinary Medical History: Negative for: End Stage Renal Disease Immunological Medical History: Negative for: Lupus Erythematosus; Raynaudos; Scleroderma Integumentary (Skin) Medical History: Negative for: History of Burn; History of pressure wounds Looney, Mohanad V. ([id number]) Musculoskeletal Medical History: Negative for: Gout; Rheumatoid Arthritis; Osteoarthritis; Osteomyelitis Neurologic Medical History: Positive for: Neuropathy Negative for: Dementia; Quadriplegia; Paraplegia; Seizure Disorder Oncologic Medical History: Negative for: Received Chemotherapy; Received Radiation Psychiatric Medical  History: Negative for: Anorexia/bulimia; Confinement Anxiety Immunizations Pneumococcal Vaccine: Received Pneumococcal Vaccination: Yes Received Pneumococcal Vaccination On or After 60th Birthday: No Implantable Devices None Family and Social History Never smoker; Marital Status - Single; Alcohol Use: Moderate; Drug Use: No History; Caffeine Use: Never; Financial Concerns: No; Food, Clothing or Shelter Needs: No; Support System Lacking: No; Transportation Concerns: No Electronic Signature(s) Signed: 08/19/2020 3:31:24 PM By: Kalman Shan DO Signed: 08/19/2020 3:37:06 PM By: Donnamarie Poag Entered By: Kalman Shan on 08/19/2020 15:28:58 Shane Bean ([id number]) -------------------------------------------------------------------------------- [place] Details Patient Name: Shane Bean Date of Service: 08/19/2020 Medical Record Number: [id number] Patient Account Number: [id number] Date of Birth/Sex: 10/12/1955 (64 y.o. M) Treating RN: Donnamarie Poag Primary Care Provider: Reeves Dam Other Clinician: Referring Provider: Reeves Dam Treating Provider/Extender: Yaakov Guthrie in Treatment: 13 Diagnosis Coding ICD-10 Codes Code Description E11.622 Type 2 diabetes mellitus with other skin ulcer I87.2 Venous insufficiency (chronic) (peripheral) 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 layer exposed I10 Essential (primary) hypertension Facility Procedures CPT4 Code: [id number] Description: [id number] - DEB SUBQ TISSUE 20 SQ CM/< Modifier: Quantity: 1 CPT4 Code: Description: ICD-10 Diagnosis Description 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 layer Modifier: exposed Quantity: Physician Procedures CPT4 Code: [id number] Description: 11042 - WC PHYS SUBQ TISS 20 SQ CM Modifier: Quantity: 1 CPT4 Code: Description: ICD-10 Diagnosis Description L97.822 Non-pressure chronic ulcer of other part of left  lower leg with fat layer Modifier: exposed Quantity: Electronic Signature(s) Signed: 08/19/2020 3:31:24 PM By: Kalman Shan DO Entered By: Kalman Shan on 08/19/2020 15:31:07

## 2020-08-19 NOTE — Progress Notes (Signed)
Shane Bean ([id number]) Visit Report for 08/19/2020 Arrival Information Details Patient Name: Shane Bean, Shane Bean Date of Service: 08/19/2020 3:00 PM Medical Record Number: [id number] Patient Account Number: [id number] Date of Birth/Sex: July 31, 1955 (65 y.o. M) Treating RN: Donnamarie Poag Primary Care Makynleigh Breslin: Reeves Dam Other Clinician: Referring Ritesh Opara: Reeves Dam Treating Izzabella Besse/Extender: Yaakov Guthrie in Treatment: 13 Visit Information History Since Last Visit Added or deleted any medications: No Patient Arrived: Ambulatory Had a fall or experienced change in No Arrival Time: 15:02 activities of daily living that may affect Accompanied By: self risk of falls: Transfer Assistance: None Hospitalized since last visit: No Patient Identification Verified: Yes Has Dressing in Place as Prescribed: Yes Secondary Verification Process Completed: Yes Pain Present Now: Yes Patient Requires Transmission-Based No Precautions: Patient Has Alerts: Yes Patient Alerts: Patient on Blood Thinner 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 M) Treating RN: Donnamarie Poag Primary Care Noni Stonesifer: Reeves Dam Other Clinician: Referring Kelin Borum: Reeves Dam Treating Geralene Afshar/Extender: Yaakov Guthrie in Treatment: 13 Clinic Level of Care Assessment Items TOOL 1 Quantity Score []  - Use when EandM and Procedure is performed on INITIAL visit 0 ASSESSMENTS - Nursing Assessment / Reassessment []  - General Physical Exam (combine w/  comprehensive assessment (listed just below) when performed on new 0 pt. evals) []  - 0 Comprehensive Assessment (HX, ROS, Risk Assessments, Wounds Hx, etc.) ASSESSMENTS - Wound and Skin Assessment / Reassessment []  - Dermatologic / Skin Assessment (not related to wound area) 0 ASSESSMENTS - Ostomy and/or Continence Assessment and Care []  - Incontinence Assessment and Management 0 []  - 0 Ostomy Care Assessment and Management (repouching, etc.) PROCESS - Coordination of Care []  - Simple Patient / Family Education for ongoing care 0 []  - 0 Complex (extensive) Patient / Family Education for ongoing care []  - 0 Staff obtains Programmer, systems, Records, Test Results / Process Orders []  - 0 Staff telephones HHA, Nursing Homes / Clarify orders / etc []  - 0 Routine Transfer to another Facility (non-emergent condition) []  - 0 Routine Hospital Admission (non-emergent condition) []  - 0 New Admissions / Biomedical engineer / Ordering NPWT, Apligraf, etc. []  - 0 Emergency Hospital Admission (emergent condition) PROCESS - Special Needs []  - Pediatric / Minor Patient Management 0 []  - 0 Isolation Patient Management []  - 0 Hearing / Language / Visual special needs []  - 0 Assessment of Community assistance (transportation, D/C planning, etc.) []  - 0 Additional assistance / Altered mentation []  - 0 Support Surface(s) Assessment (bed, cushion, seat, etc.) INTERVENTIONS - Miscellaneous []  - External ear exam 0 []  - 0 Patient Transfer (multiple staff / Civil Service fast streamer / Similar devices) []  - 0 Simple Staple / Suture removal (25 or less) []  - 0 Complex Staple / Suture removal (26 or more) []  - 0 Hypo/Hyperglycemic Management (do not check if billed separately) []  - 0 Ankle / Brachial Index (ABI) - do not check if billed separately Has the patient been seen at the hospital within the last three years: Yes Total Score: 0 Level Of Care: ____ Shane Bean ([id number]) Electronic  Signature(s) Signed: 08/19/2020 3:37:06 PM By: Donnamarie Poag Entered By: Donnamarie Poag on 08/19/2020 15:25:10 Shane Bean ([id number]) -------------------------------------------------------------------------------- Compression Therapy Details  Patient Name: Shane Bean Date of Service: 08/19/2020 3:00 PM Medical Record Number: [id number] Patient Account Number: [id number] Date of Birth/Sex: 1955-12-13 (65 y.o. M) Treating RN: Donnamarie Poag Primary Care Brenson Hartman: Reeves Dam Other Clinician: Referring Levi Crass: Reeves Dam Treating Story Conti/Extender: Yaakov Guthrie in Treatment: 13 Compression Therapy Performed for Wound Assessment: Wound #1 Left,Posterior Lower Leg Performed By: Clinician Donnamarie Poag, RN Compression Type: Three Layer Post Procedure Diagnosis Same as Pre-procedure Electronic Signature(s) Signed: 08/19/2020 3:37:06 PM By: Donnamarie Poag Entered By: Donnamarie Poag on 08/19/2020 15:21:09 Shane Bean ([id number]) -------------------------------------------------------------------------------- Encounter Discharge Information Details Patient Name: Shane Bean Date of Service: 08/19/2020 3:00 PM Medical Record Number: [id number] Patient Account Number: [id number] Date of Birth/Sex: 1955/02/01 (65 y.o. M) Treating RN: Donnamarie Poag Primary Care Jammy Stlouis: Reeves Dam Other Clinician: Referring Ertha Nabor: Reeves Dam Treating Margerie Fraiser/Extender: Yaakov Guthrie in Treatment: 13 Encounter Discharge Information Items Post Procedure Vitals Discharge Condition: Stable Temperature (F): 98.4 Ambulatory Status: Ambulatory Pulse (bpm): 65 Discharge Destination: Home Respiratory Rate (breaths/min): 16 Transportation: Private Auto Blood Pressure (mmHg): 106/58 Accompanied By: self Schedule Follow-up Appointment: Yes Clinical Summary of Care: Electronic Signature(s) Signed: 08/19/2020 3:37:06 PM By: Donnamarie Poag Entered By: Donnamarie Poag on  08/19/2020 [place], [place]. ([id number]) -------------------------------------------------------------------------------- Lower Extremity Assessment Details Patient Name: Shane Bean Date of Service: 08/19/2020 3:00 PM Medical Record Number: [id number] Patient Account Number: [id number] Date of Birth/Sex: 05/01/1955 (64 y.o. M) Treating RN: Donnamarie Poag Primary Care Cherre Kothari: Reeves Dam Other Clinician: Referring Aoi Kouns: Reeves Dam Treating Tully Mcinturff/Extender: Yaakov Guthrie in Treatment: 13 Edema Assessment Assessed: Shirlyn Goltz: Yes] [Right: No] Edema: [Left: N] [Right: o] Calf Left: Right: Point of Measurement: 34 cm From Medial Instep 42 cm Ankle Left: Right: Point of Measurement: 10 cm From Medial Instep 24.5 cm Vascular Assessment Pulses: Dorsalis Pedis Palpable: [Left:Yes] Electronic Signature(s) Signed: 08/19/2020 3:20:19 PM By: Donnamarie Poag Entered By: Donnamarie Poag on 08/19/2020 15:11:56 Shane Bean ([id number]) -------------------------------------------------------------------------------- Multi Wound Chart Details Patient Name: Shane Bean Date of Service: 08/19/2020 3:00 PM Medical Record Number: [id number] Patient Account Number: [id number] Date of Birth/Sex: 1955-03-04 (64 y.o. M) Treating RN: Donnamarie Poag Primary Care Haja Crego: Reeves Dam Other Clinician: Referring Bernon Arviso: Reeves Dam Treating Aymara Sassi/Extender: Yaakov Guthrie in Treatment: 13 Vital Signs Height(in): 70 Pulse(bpm): 10 Weight(lbs): 290 Blood Pressure(mmHg): 106/58 Body Mass Index(BMI): 42 Temperature(F): 98.4 Respiratory Rate(breaths/min): 16 Photos: [N/A:N/A] Wound Location: Left, Posterior Lower Leg N/A N/A Wounding Event: Gradually Appeared N/A N/A Primary Etiology: Diabetic Wound/Ulcer of the Lower N/A N/A Extremity Comorbid History: Hypertension, Type II Diabetes, N/A N/A Neuropathy Date Acquired: 04/08/2020 N/A N/A Weeks of  Treatment: 13 N/A N/A Wound Status: Open N/A N/A Measurements L x W x D (cm) 5x2.4x0.2 N/A N/A Area (cm) : 9.425 N/A N/A Volume (cm) : 1.885 N/A N/A % Reduction in Area: 14.30% N/A N/A % Reduction in Volume: -71.40% N/A N/A Classification: Grade 2 N/A N/A Exudate Amount: Large N/A N/A Exudate Type: Serosanguineous N/A N/A Exudate Color: red, brown N/A N/A Granulation Amount: Medium (34-66%) N/A N/A Granulation Quality: Red, Pink N/A N/A Necrotic Amount: Medium (34-66%) N/A N/A Necrotic Tissue: Eschar, Adherent Slough N/A N/A Exposed Structures: Fat Layer (Subcutaneous Tissue): N/A N/A Yes Fascia: No Tendon: No Muscle: No Joint: No Bone: No Epithelialization: Medium (34-66%) N/A N/A Debridement: Debridement - Excisional N/A N/A Pre-procedure Verification/Time 15:22 N/A N/A Out Taken: Pain Control: Lidocaine N/A N/A Tissue Debrided: Subcutaneous, Slough N/A N/A Level: Skin/Subcutaneous Tissue N/A N/A Debridement Area (sq cm): 12  N/A N/A Instrument: Curette N/A N/A Bleeding: Minimum N/A N/A Hemostasis Achieved: Pressure N/A N/A Debridement Treatment Procedure was tolerated well N/A N/A ResponseDARRIS, STAIGER ([id number]) Post Debridement 5x2.4x0.2 N/A N/A Measurements L x W x D (cm) Post Debridement Volume: 1.885 N/A N/A (cm) Procedures Performed: Compression Therapy N/A N/A Debridement Treatment Notes Electronic Signature(s) Signed: 08/19/2020 3:31:24 PM By: Kalman Shan DO Previous Signature: 08/19/2020 3:20:19 PM Version By: Donnamarie Poag Entered By: Kalman Shan on 08/19/2020 15:28:24 Shane Bean ([id number]) -------------------------------------------------------------------------------- Multi-Disciplinary Care Plan Details Patient Name: Shane Bean Date of Service: 08/19/2020 3:00 PM Medical Record Number: [id number] Patient Account Number: [id number] Date of Birth/Sex: [birth year] (64 y.o. M) Treating RN: Donnamarie Poag Primary Care  Jaycion Treml: Reeves Dam Other Clinician: Referring Jennilee Demarco: Reeves Dam Treating Elbert Polyakov/Extender: Yaakov Guthrie in Treatment: 13 Active Inactive Wound/Skin Impairment Nursing Diagnoses: Impaired tissue integrity Knowledge deficit related to smoking impact on wound healing Knowledge deficit related to ulceration/compromised skin integrity Goals: Patient/caregiver will verbalize understanding of skin care regimen Date Initiated: 05/18/2020 Date Inactivated: 07/08/2020 Target Resolution Date: 06/19/2020 Goal Status: Met Ulcer/skin breakdown will have a volume reduction of 30% by week 4 Date Initiated: 05/18/2020 Date Inactivated: 07/08/2020 Target Resolution Date: 06/15/2020 Goal Status: Met Ulcer/skin breakdown will have a volume reduction of 50% by week 8 Date Initiated: 05/18/2020 Date Inactivated: 07/15/2020 Target Resolution Date: 07/13/2020 Goal Status: Unmet Unmet Reason: con't tx Ulcer/skin breakdown will have a volume reduction of 80% by week 12 Date Initiated: 05/18/2020 Target Resolution Date: 08/10/2020 Goal Status: Active Interventions: Assess patient/caregiver ability to obtain necessary supplies Assess patient/caregiver ability to perform ulcer/skin care regimen upon admission and as needed Assess ulceration(s) every visit Notes: Electronic Signature(s) Signed: 08/19/2020 3:20:19 PM By: Donnamarie Poag Entered By: Donnamarie Poag on 08/19/2020 15:12:10 Shane Bean ([id number]) -------------------------------------------------------------------------------- Pain Assessment Details Patient Name: Shane Bean Date of Service: 08/19/2020 3:00 PM Medical Record Number: [id number] Patient Account Number: [id number] Date of Birth/Sex: [birth year] (64 y.o. M) Treating RN: Donnamarie Poag Primary Care Duwayne Matters: Reeves Dam Other Clinician: Referring Georgi Tuel: Reeves Dam Treating Torian Thoennes/Extender: Yaakov Guthrie in Treatment: 13 Active  Problems Location of Pain Severity and Description of Pain Patient Has Paino Yes Site Locations Pain Location: Pain in Ulcers Rate the pain. Current Pain Level: 7 Pain Management and Medication Current Pain Management: Electronic Signature(s) Signed: 08/19/2020 3:20:19 PM By: Donnamarie Poag Entered By: Donnamarie Poag on 08/19/2020 15:05:07 Shane Bean ([id number]) -------------------------------------------------------------------------------- Patient/Caregiver Education Details Patient Name: Shane Bean Date of Service: 08/19/2020 3:00 PM Medical Record Number: [id number] Patient Account Number: [id number] Date of Birth/Gender: 1955/01/10 (64 y.o. M) Treating RN: Donnamarie Poag Primary Care Physician: Reeves Dam Other Clinician: Referring Physician: Reeves Dam Treating Physician/Extender: Yaakov Guthrie in Treatment: 13 Education Assessment Education Provided To: Patient Education Topics Provided Nutrition: Wound Debridement: Wound/Skin Impairment: Electronic Signature(s) Signed: 08/19/2020 3:37:06 PM By: Donnamarie Poag Entered By: Donnamarie Poag on 08/19/2020 15:25:32 Shane Bean ([id number]) -------------------------------------------------------------------------------- Wound Assessment Details Patient Name: Shane Bean Date of Service: 08/19/2020 3:00 PM Medical Record Number: [id number] Patient Account Number: [id number] Date of Birth/Sex: 12/25/1955 (64 y.o. M) Treating RN: Donnamarie Poag Primary Care Jeni Duling: Reeves Dam Other Clinician: Referring Cayde Held: Reeves Dam Treating Idonna Heeren/Extender: Yaakov Guthrie in Treatment: 13 Wound Status Wound Number: 1 Primary Etiology: Diabetic Wound/Ulcer of the Lower Extremity Wound Location: Left, Posterior Lower Leg Wound Status: Open Wounding Event: Gradually Appeared Comorbid History: Hypertension, Type II Diabetes, Neuropathy Date Acquired: 04/08/2020 Weeks Of Treatment:  56  Clustered Wound: No Photos Wound Measurements Length: (cm) 5 Width: (cm) 2.4 Depth: (cm) 0.2 Area: (cm) 9.425 Volume: (cm) 1.885 % Reduction in Area: 14.3% % Reduction in Volume: -71.4% Epithelialization: Medium (34-66%) Tunneling: No Undermining: No Wound Description Classification: Grade 2 Exudate Amount: Large Exudate Type: Serosanguineous Exudate Color: red, brown Foul Odor After Cleansing: No Slough/Fibrino Yes Wound Bed Granulation Amount: Medium (34-66%) Exposed Structure Granulation Quality: Red, Pink Fascia Exposed: No Necrotic Amount: Medium (34-66%) Fat Layer (Subcutaneous Tissue) Exposed: Yes Necrotic Quality: Eschar, Adherent Slough Tendon Exposed: No Muscle Exposed: No Joint Exposed: No Bone Exposed: No Treatment Notes Wound #1 (Lower Leg) Wound Laterality: Left, Posterior Cleanser Soap and Water Discharge Instruction: Gently cleanse wound with antibacterial soap, rinse and pat dry prior to dressing wounds Peri-Wound Care Triamcinolone Acetonide Cream, 0.1%, 15 (g) tube Ryner, Khalfani V. ([id number]) Discharge Instruction: peri wound Topical Santyl Collagenase Ointment, 30 (gm), tube Discharge Instruction: Apply to wound in clinic only. Primary Dressing Hydrofera Blue Ready Transfer Foam, 2.5x2.5 (in/in) Discharge Instruction: Apply Hydrofera Blue Ready to wound bed as directed Secondary Dressing ABD Pad 5x9 (in/in) Discharge Instruction: Cover with ABD pad Secured With Compression Wrap Profore Lite LF 3 Multilayer Compression [place] Discharge Instruction: Apply 3 multi-layer wrap as prescribed. Compression Stockings Add-Ons Electronic Signature(s) Signed: 08/19/2020 3:20:19 PM By: Donnamarie Poag Entered By: Donnamarie Poag on 08/19/2020 15:10:50 Shane Bean ([id number]) -------------------------------------------------------------------------------- [place] Details Patient Name: Shane Bean Date of Service: 08/19/2020  3:00 PM Medical Record Number: [id number] Patient Account Number: [id number] Date of Birth/Sex: Dec 20, 1955 (64 y.o. M) Treating RN: Donnamarie Poag Primary Care Hamad Whyte: Reeves Dam Other Clinician: Referring Shiri Hodapp: Reeves Dam Treating Dorrie Cocuzza/Extender: Yaakov Guthrie in Treatment: 13 Vital Signs Time Taken: 15:03 Temperature (F): 98.4 Height (in): 70 Pulse (bpm): 65 Weight (lbs): 290 Respiratory Rate (breaths/min): 16 Body Mass Index (BMI): 41.6 Blood Pressure (mmHg): 106/58 Reference Range: 80 - 120 mg / dl Electronic Signature(s) Signed: 08/19/2020 3:20:19 PM By: Donnamarie Poag Entered ByDonnamarie Poag on 08/19/2020 15:04:58

## 2020-08-26 ENCOUNTER — Other Ambulatory Visit: Payer: Self-pay

## 2020-08-26 ENCOUNTER — Encounter (HOSPITAL_BASED_OUTPATIENT_CLINIC_OR_DEPARTMENT_OTHER): Payer: Medicare (Managed Care) | Admitting: Internal Medicine

## 2020-08-26 DIAGNOSIS — L97822 Non-pressure chronic ulcer of other part of left lower leg with fat layer exposed: Secondary | ICD-10-CM | POA: Diagnosis not present

## 2020-08-26 DIAGNOSIS — I87331 Chronic venous hypertension (idiopathic) with ulcer and inflammation of right lower extremity: Secondary | ICD-10-CM | POA: Diagnosis not present

## 2020-08-26 DIAGNOSIS — E11622 Type 2 diabetes mellitus with other skin ulcer: Secondary | ICD-10-CM

## 2020-08-31 NOTE — Progress Notes (Signed)
[name], [name] ([id number]) Visit Report for 08/26/2020 Chief Complaint Document Details Patient Name: Shane Bean, Shane Bean Date of Service: 08/26/2020 2:15 PM Medical Record Number: [id number] Patient Account Number: [id number] Date of Birth/Sex: 30-Jul-1955 (65 y.o. M) Treating RN: Carlene Coria Primary Care Provider: Reeves Dam Other Clinician: Referring Provider: Reeves Dam Treating Provider/Extender: Yaakov Guthrie in Treatment: 14 Information Obtained from: Patient Chief Complaint Left LE Ulcer Electronic Signature(s) Signed: 08/26/2020 3:01:21 PM By: Kalman Shan DO Entered By: Kalman Shan on 08/26/2020 14:57:31 Shane Bean ([id number]) -------------------------------------------------------------------------------- Debridement Details Patient Name: Shane Bean Date of Service: 08/26/2020 2:15 PM Medical Record Number: [id number] Patient Account Number: [id number] Date of Birth/Sex: May 29, 1955 (65 y.o. M) Treating RN: Carlene Coria Primary Care Provider: Reeves Dam Other Clinician: Referring Provider: Reeves Dam Treating Provider/Extender: Yaakov Guthrie in Treatment: 14 Debridement Performed for Wound #1 Left,Posterior Lower Leg Assessment: Performed By: Physician Kalman Shan, MD Debridement Type: Debridement Severity of Tissue Pre Debridement: Fat layer exposed Level of Consciousness (Pre- Awake and Alert procedure): Pre-procedure Verification/Time Out Yes - 14:50 Taken: Start Time: 14:50 Pain Control: Lidocaine 4% Topical Solution Total Area Debrided (L x W): 2.5 (cm) x 2.5 (cm) = 6.25 (cm) Tissue and other material Viable, Non-Viable, Slough, Subcutaneous, Skin: Dermis , Skin: Epidermis, Slough debrided: Level: Skin/Subcutaneous Tissue Debridement Description: Excisional Instrument: Curette Bleeding: Minimum Hemostasis Achieved: Pressure End Time: 14:53 Procedural Pain: 4 Post Procedural Pain:  2 Response to Treatment: Procedure was tolerated well Level of Consciousness (Post- Awake and Alert procedure): Post Debridement Measurements of Total Wound Length: (cm) 5 Width: (cm) 2.5 Depth: (cm) 0.2 Volume: (cm) 1.963 Character of Wound/Ulcer Post Debridement: Improved Severity of Tissue Post Debridement: Fat layer exposed Post Procedure Diagnosis Same as Pre-procedure Electronic Signature(s) Signed: 08/26/2020 3:01:21 PM By: Kalman Shan DO Signed: 08/31/2020 1:47:40 PM By: Carlene Coria RN Entered By: Carlene Coria on 08/26/2020 14:54:04 Shane Bean ([id number]) -------------------------------------------------------------------------------- HPI Details Patient Name: Shane Bean Date of Service: 08/26/2020 2:15 PM Medical Record Number: [id number] Patient Account Number: [id number] Date of Birth/Sex: May 06, 1955 (65 y.o. M) Treating RN: Carlene Coria Primary Care Provider: Reeves Dam Other Clinician: Referring Provider: Reeves Dam Treating Provider/Extender: Yaakov Guthrie in Treatment: 14 History of Present Illness HPI Description: 05/18/2020 upon evaluation today patient appears for initial evaluation here in clinic concerning issues that he has been having with the wound on his left lateral leg. Fortunately there does not appear to be any signs of obvious an active infection at this time which is great news. With that being said the patient unfortunately is continued to have issues with pain although he tells me it is gotten a lot better. He was on Keflex as well as Bactrim DS which seems to have done a good job there. His most recent hemoglobin A1c was 9.8 that was on 04/14/2020. Subsequently I do feel like that the patient is making progress here. He does have a history of chronic venous insufficiency as well as hypertension. I do not see any need for antibiotics at this point. 5/25; follow-up of the wound on the left posterior calf. This is  completely necrotic on the surface. It does not look infected but it is painful. He is a diabetic but I think there is some suggestion here of chronic venous disease as well. We used Iodoflex under compression last week 6/1; again a completely necrotic surface on this with the wound. We have been using Iodoflex under compression he complains of gnawing pain. He has  had wounds previously a lot of this looks like chronic venous disease. 6/8; about two thirds of the surface of this wound is still covered in a black necrotic eschar. I changed him from Iodoflex to [place] last week because of complaints of pain. He actually was seen by her neurologist this weeko Peripheral neuropathy as a cause of the pain and they changed him from Neurontin to Lyrica but he still has not had any relief. He says that most of the pain however is in his heel not in his wound per se. He is a diabetic 6/15. This is a patient with what looks to be a venous wound on the left lateral lower leg. He is also a diabetic. Currently being worked up for diabetic neuropathy. He complains of pain out of proportion to the size of the wound although to be fair the entire area here was eschared. We have been working to get a viable surface. We are using Sorbact 6/22; fairly painful wound on the left posterior calf. I am assuming this is been venous he is also a diabetic. His ABI in our clinic was noncompressible however he has easily palpable pulses on his feet. He complains of a lot of pain in the wound also of the left heel and the upper left calf. Some of this may be neuropathy. It is led me to discontinue Iodoflex reduce his compression but he still complains of pain in fact he says he could not take a debridement today. When I first saw this it was completely necrotic surface we have got it down to something that looks a reasonably healthy I have been wondering about biopsying this area however he is on Coumadin and with the pain I  have put this off. The major question would be an inflammatory ulcer such as pyoderma. The patient is not aware of how this started. He does however have a wound history on both legs. 6/29; patient presents for 1 week follow-up. He has been using Hydrofera Blue under Kerlix/Coban. He has tolerated this well. He currently denies signs of infection. He also declines debridement today. He he states it is painful and does not want to have this done. 7/6; Patient presents for 1 week follow-up. He has been using Hydrofera Blue under Kerlix/Coban. He denies signs of infection. He declines debridement today. 7/15; patient presents for 1 week follow-up. He has been using Hydrofera Blue under Kerlix/Coban. He denies signs of infection today. He is agreeable to having debridement done today. 7/20; patient presents for 1 week follow-up. He is in good spirits today. He has been using Hydrofera Blue under Kerlix/Coban. He denies signs of infection today. 7/27; patient presents for 1 week follow-up. He is in good spirits today. He has no issues or complaints today. He denies signs of infection. 08/12/2020 upon evaluation today patient appears to be doing better in regard to his wound. He has been tolerating the dressing changes without complication. With that being said he did have arterial studies and does show that he has a TBI on the right of 0.90 and a TBI on the left of 0.76 which is excellent. There is no evidence of arterial compromise at this point. 8/17; patient presents for 1 week follow-up. He has no issues or complaints today. He denies signs of infection. 8/24; patient presents for 1 week follow-up. He has no issues or complaints today. He denies signs of infection. He continues to tolerate the compression wrap well. Electronic Signature(s) Signed: 08/26/2020 3:01:21 PM  By: Kalman Shan DO Entered By: Kalman Shan on 08/26/2020 14:58:07 Shane Bean  ([id number]) -------------------------------------------------------------------------------- Physical Exam Details Patient Name: Shane Bean Date of Service: 08/26/2020 2:15 PM Medical Record Number: [id number] Patient Account Number: [id number] Date of Birth/Sex: 10/09/1955 (64 y.o. M) Treating RN: Carlene Coria Primary Care Provider: Reeves Dam Other Clinician: Referring Provider: Reeves Dam Treating Provider/Extender: Yaakov Guthrie in Treatment: 14 Constitutional . Cardiovascular . Psychiatric . Notes Left lower extremity: To the posterior aspect there is an open wound with nonviable tissue and granulation tissue present. No signs of infection. Good edema control Electronic Signature(s) Signed: 08/26/2020 3:01:21 PM By: Kalman Shan DO Entered By: Kalman Shan on 08/26/2020 14:58:33 Shane Bean ([id number]) -------------------------------------------------------------------------------- Physician Orders Details Patient Name: Shane Bean Date of Service: 08/26/2020 2:15 PM Medical Record Number: [id number] Patient Account Number: [id number] Date of Birth/Sex: 1956-01-03 (64 y.o. M) Treating RN: Carlene Coria Primary Care Provider: Reeves Dam Other Clinician: Referring Provider: Reeves Dam Treating Provider/Extender: Yaakov Guthrie in Treatment: 66 Verbal / Phone Orders: No Diagnosis Coding Follow-up Appointments o Return Appointment in 1 week. o Nurse Visit as needed Bathing/ Shower/ Hygiene o May shower with wound dressing protected with water repellent cover or cast protector. - cover dressing during shower-do not get it wet Edema Control - Lymphedema / Segmental Compressive Device / Other o Elevate, Exercise Daily and Avoid Standing for Long Periods of Time. o Elevate legs to the level of the heart and pump ankles as often as possible o Elevate leg(s) parallel to the floor when sitting. o DO YOUR  BEST to sleep in the bed at night. DO NOT sleep in your recliner. Long hours of sitting in a recliner leads to swelling of the legs and/or potential wounds on your backside. Wound Treatment Wound #1 - Lower Leg Wound Laterality: Left, Posterior Cleanser: Soap and Water 1 x Per Week/30 Days Discharge Instructions: Gently cleanse wound with antibacterial soap, rinse and pat dry prior to dressing wounds Peri-Wound Care: Triamcinolone Acetonide Cream, 0.1%, 15 (g) tube 1 x Per Week/30 Days Discharge Instructions: peri wound Topical: Santyl Collagenase Ointment, 30 (gm), tube 1 x Per Week/30 Days Discharge Instructions: Apply to wound in clinic only. Primary Dressing: Hydrofera Blue Ready Transfer Foam, 2.5x2.5 (in/in) 1 x Per Week/30 Days Discharge Instructions: Apply Hydrofera Blue Ready to wound bed as directed Secondary Dressing: ABD Pad 5x9 (in/in) 1 x Per Week/30 Days Discharge Instructions: Cover with ABD pad Compression Wrap: Profore Lite LF 3 Multilayer Compression Bandaging System 1 x Per Week/30 Days Discharge Instructions: Apply 3 multi-layer wrap as prescribed. Compression Stockings: Circaid Juxta Lite Compression Wrap (DME) Left Leg Compression Amount: 20-30 mmHG Discharge Instructions: Apply Circaid Juxta Lite Compression Wrap as directed Electronic Signature(s) Signed: 08/26/2020 3:01:21 PM By: Kalman Shan DO Signed: 08/31/2020 1:47:40 PM By: Carlene Coria RN Entered By: Carlene Coria on 08/26/2020 14:57:10 Shane Bean ([id number]) -------------------------------------------------------------------------------- Problem List Details Patient Name: Shane Bean Date of Service: 08/26/2020 2:15 PM Medical Record Number: [id number] Patient Account Number: [id number] Date of Birth/Sex: Mar 11, 1955 (64 y.o. M) Treating RN: Carlene Coria Primary Care Provider: Reeves Dam Other Clinician: Referring Provider: Reeves Dam Treating Provider/Extender: Yaakov Guthrie in Treatment: 14 Active Problems ICD-10 Encounter Code Description Active Date MDM Diagnosis E11.622 Type 2 diabetes mellitus with other skin ulcer 05/18/2020 No Yes I87.2 Venous insufficiency (chronic) (peripheral) 05/18/2020 No Yes 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 layer 05/18/2020 No Yes exposed  I10 Essential (primary) hypertension 05/18/2020 No Yes I87.331 Chronic venous hypertension (idiopathic) with ulcer and inflammation of 08/26/2020 No Yes right lower extremity Inactive Problems Resolved Problems Electronic Signature(s) Signed: 08/26/2020 3:01:21 PM By: Kalman Shan DO Entered By: Kalman Shan on 08/26/2020 14:57:07 Shane Bean ([id number]) -------------------------------------------------------------------------------- Progress Note Details Patient Name: Shane Bean Date of Service: 08/26/2020 2:15 PM Medical Record Number: [id number] Patient Account Number: [id number] Date of Birth/Sex: 07/05/1955 (64 y.o. M) Treating RN: Carlene Coria Primary Care Provider: Reeves Dam Other Clinician: Referring Provider: Reeves Dam Treating Provider/Extender: Yaakov Guthrie in Treatment: 14 Subjective Chief Complaint Information obtained from Patient Left LE Ulcer History of Present Illness (HPI) 05/18/2020 upon evaluation today patient appears for initial evaluation here in clinic concerning issues that he has been having with the wound on his left lateral leg. Fortunately there does not appear to be any signs of obvious an active infection at this time which is great news. With that being said the patient unfortunately is continued to have issues with pain although he tells me it is gotten a lot better. He was on Keflex as well as Bactrim DS which seems to have done a good job there. His most recent hemoglobin A1c was 9.8 that was on 04/14/2020. Subsequently I do feel like that the patient is making  progress here. He does have a history of chronic venous insufficiency as well as hypertension. I do not see any need for antibiotics at this point. 5/25; follow-up of the wound on the left posterior calf. This is completely necrotic on the surface. It does not look infected but it is painful. He is a diabetic but I think there is some suggestion here of chronic venous disease as well. We used Iodoflex under compression last week 6/1; again a completely necrotic surface on this with the wound. We have been using Iodoflex under compression he complains of gnawing pain. He has had wounds previously a lot of this looks like chronic venous disease. 6/8; about two thirds of the surface of this wound is still covered in a black necrotic eschar. I changed him from Iodoflex to [place] last week because of complaints of pain. He actually was seen by her neurologist this weeko Peripheral neuropathy as a cause of the pain and they changed him from Neurontin to Lyrica but he still has not had any relief. He says that most of the pain however is in his heel not in his wound per se. He is a diabetic 6/15. This is a patient with what looks to be a venous wound on the left lateral lower leg. He is also a diabetic. Currently being worked up for diabetic neuropathy. He complains of pain out of proportion to the size of the wound although to be fair the entire area here was eschared. We have been working to get a viable surface. We are using Sorbact 6/22; fairly painful wound on the left posterior calf. I am assuming this is been venous he is also a diabetic. His ABI in our clinic was noncompressible however he has easily palpable pulses on his feet. He complains of a lot of pain in the wound also of the left heel and the upper left calf. Some of this may be neuropathy. It is led me to discontinue Iodoflex reduce his compression but he still complains of pain in fact he says he could not take a debridement today. When I  first saw this it was completely necrotic surface we have got  it down to something that looks a reasonably healthy I have been wondering about biopsying this area however he is on Coumadin and with the pain I have put this off. The major question would be an inflammatory ulcer such as pyoderma. The patient is not aware of how this started. He does however have a wound history on both legs. 6/29; patient presents for 1 week follow-up. He has been using Hydrofera Blue under Kerlix/Coban. He has tolerated this well. He currently denies signs of infection. He also declines debridement today. He he states it is painful and does not want to have this done. 7/6; Patient presents for 1 week follow-up. He has been using Hydrofera Blue under Kerlix/Coban. He denies signs of infection. He declines debridement today. 7/15; patient presents for 1 week follow-up. He has been using Hydrofera Blue under Kerlix/Coban. He denies signs of infection today. He is agreeable to having debridement done today. 7/20; patient presents for 1 week follow-up. He is in good spirits today. He has been using Hydrofera Blue under Kerlix/Coban. He denies signs of infection today. 7/27; patient presents for 1 week follow-up. He is in good spirits today. He has no issues or complaints today. He denies signs of infection. 08/12/2020 upon evaluation today patient appears to be doing better in regard to his wound. He has been tolerating the dressing changes without complication. With that being said he did have arterial studies and does show that he has a TBI on the right of 0.90 and a TBI on the left of 0.76 which is excellent. There is no evidence of arterial compromise at this point. 8/17; patient presents for 1 week follow-up. He has no issues or complaints today. He denies signs of infection. 8/24; patient presents for 1 week follow-up. He has no issues or complaints today. He denies signs of infection. He continues to tolerate  the compression wrap well. Patient History Information obtained from Patient. Social History Never smoker, Marital Status - Single, Alcohol Use - Moderate, Drug Use - No History, Caffeine Use - Never. Medical History Eyes Shane Bean, Shane Bean ([id number]) Denies history of Cataracts, Glaucoma, Optic Neuritis Ear/Nose/Mouth/Throat Denies history of Chronic sinus problems/congestion Hematologic/Lymphatic Denies history of Anemia, Hemophilia, Human Immunodeficiency Virus, Lymphedema Respiratory Denies history of Aspiration, Asthma, Chronic Obstructive Pulmonary Disease (COPD), Pneumothorax, Sleep Apnea, Tuberculosis Cardiovascular Patient has history of Hypertension Denies history of Angina, Arrhythmia, Congestive Heart Failure, Coronary Artery Disease, Deep Vein Thrombosis, Hypotension, Myocardial Infarction, Peripheral Arterial Disease, Peripheral Venous Disease, Phlebitis, Vasculitis Gastrointestinal Denies history of Cirrhosis , Colitis, Crohn s, Hepatitis A, Hepatitis B Endocrine Patient has history of Type II Diabetes Denies history of Type I Diabetes Genitourinary Denies history of End Stage Renal Disease Immunological Denies history of Lupus Erythematosus, Raynaud s, Scleroderma Integumentary (Skin) Denies history of History of Burn, History of pressure wounds Musculoskeletal Denies history of Gout, Rheumatoid Arthritis, Osteoarthritis, Osteomyelitis Neurologic Patient has history of Neuropathy Denies history of Dementia, Quadriplegia, Paraplegia, Seizure Disorder Oncologic Denies history of Received Chemotherapy, Received Radiation Psychiatric Denies history of Anorexia/bulimia, Confinement Anxiety Objective Constitutional Vitals Time Taken: 2:23 PM, Height: 70 in, Weight: 290 lbs, BMI: 41.6, Temperature: 98.4 F, Pulse: 71 bpm, Respiratory Rate: 18 breaths/min, Blood Pressure: 136/71 mmHg. General Notes: Left lower extremity: To the posterior aspect there is an open  wound with nonviable tissue and granulation tissue present. No signs of infection. Good edema control Integumentary (Hair, Skin) Wound #1 status is Open. Original cause of wound was Gradually Appeared. The date acquired was:  04/08/2020. The wound has been in treatment 14 weeks. The wound is located on the Left,Posterior Lower Leg. The wound measures 5cm length x 2.5cm width x 0.2cm depth; 9.817cm^2 area and 1.963cm^3 volume. There is Fat Layer (Subcutaneous Tissue) exposed. There is no tunneling or undermining noted. There is a large amount of serosanguineous drainage noted. There is medium (34-66%) red, pink granulation within the wound bed. There is a medium (34-66%) amount of necrotic tissue within the wound bed including Eschar and Adherent Slough. Assessment Active Problems ICD-10 Type 2 diabetes mellitus with other skin ulcer Venous insufficiency (chronic) (peripheral) Non-pressure chronic ulcer of other part of left lower leg with fat layer exposed Essential (primary) hypertension Chronic venous hypertension (idiopathic) with ulcer and inflammation of right lower extremity Shane Bean, Shane V. ([id number]) Patient's wound continues to show improvement in size and appearance. I debrided nonviable tissue. No signs of infection. I recommended continuing with Santyl Hydrofera Blue under compression therapy. The wound is nearing closure and I recommended juxta light compressions to use post discharge. He is agreeable with this plan. Procedures Wound #1 Pre-procedure diagnosis of Wound #1 is a Diabetic Wound/Ulcer of the Lower Extremity located on the Left,Posterior Lower Leg .Severity of Tissue Pre Debridement is: Fat layer exposed. There was a Excisional Skin/Subcutaneous Tissue Debridement with a total area of 6.25 sq cm performed by Kalman Shan, MD. With the following instrument(s): Curette to remove Viable and Non-Viable tissue/material. Material removed includes Subcutaneous Tissue,  Slough, Skin: Dermis, and Skin: Epidermis after achieving pain control using Lidocaine 4% Topical Solution. No specimens were taken. A time out was conducted at 14:50, prior to the start of the procedure. A Minimum amount of bleeding was controlled with Pressure. The procedure was tolerated well with a pain level of 4 throughout and a pain level of 2 following the procedure. Post Debridement Measurements: 5cm length x 2.5cm width x 0.2cm depth; 1.963cm^3 volume. Character of Wound/Ulcer Post Debridement is improved. Severity of Tissue Post Debridement is: Fat layer exposed. Post procedure Diagnosis Wound #1: Same as Pre-Procedure Plan Follow-up Appointments: Return Appointment in 1 week. Nurse Visit as needed Bathing/ Shower/ Hygiene: May shower with wound dressing protected with water repellent cover or cast protector. - cover dressing during shower-do not get it wet Edema Control - Lymphedema / Segmental Compressive Device / Other: Elevate, Exercise Daily and Avoid Standing for Long Periods of Time. Elevate legs to the level of the heart and pump ankles as often as possible Elevate leg(s) parallel to the floor when sitting. DO YOUR BEST to sleep in the bed at night. DO NOT sleep in your recliner. Long hours of sitting in a recliner leads to swelling of the legs and/or potential wounds on your backside. WOUND #1: - Lower Leg Wound Laterality: Left, Posterior Cleanser: Soap and Water 1 x Per Week/30 Days Discharge Instructions: Gently cleanse wound with antibacterial soap, rinse and pat dry prior to dressing wounds Peri-Wound Care: Triamcinolone Acetonide Cream, 0.1%, 15 (g) tube 1 x Per Week/30 Days Discharge Instructions: peri wound Topical: Santyl Collagenase Ointment, 30 (gm), tube 1 x Per Week/30 Days Discharge Instructions: Apply to wound in clinic only. Primary Dressing: Hydrofera Blue Ready Transfer Foam, 2.5x2.5 (in/in) 1 x Per Week/30 Days Discharge Instructions: Apply Hydrofera  Blue Ready to wound bed as directed Secondary Dressing: ABD Pad 5x9 (in/in) 1 x Per Week/30 Days Discharge Instructions: Cover with ABD pad Compression Wrap: Profore Lite LF 3 Multilayer Compression Bandaging System 1 x Per Week/30 Days Discharge Instructions:  Apply 3 multi-layer wrap as prescribed. Compression Stockings: Circaid Juxta Lite Compression Wrap (DME) Compression Amount: 20-30 mmHg (left) Discharge Instructions: Apply Circaid Juxta Lite Compression Wrap as directed 1. Santyl, Hydrofera Blue under compression wrap 2. Order juxta lite 3. Follow-up in 1 week Electronic Signature(s) Signed: 08/26/2020 3:01:21 PM By: Kalman Shan DO Entered By: Kalman Shan on 08/26/2020 15:00:24 Shane Bean ([id number]) -------------------------------------------------------------------------------- ROS/PFSH Details Patient Name: Shane Bean Date of Service: 08/26/2020 2:15 PM Medical Record Number: [id number] Patient Account Number: [id number] Date of Birth/Sex: December 10, 1955 (64 y.o. M) Treating RN: Carlene Coria Primary Care Provider: Reeves Dam Other Clinician: Referring Provider: Reeves Dam Treating Provider/Extender: Yaakov Guthrie in Treatment: 14 Information Obtained From Patient Eyes Medical History: Negative for: Cataracts; Glaucoma; Optic Neuritis Ear/Nose/Mouth/Throat Medical History: Negative for: Chronic sinus problems/congestion Hematologic/Lymphatic Medical History: Negative for: Anemia; Hemophilia; Human Immunodeficiency Virus; Lymphedema Respiratory Medical History: Negative for: Aspiration; Asthma; Chronic Obstructive Pulmonary Disease (COPD); Pneumothorax; Sleep Apnea; Tuberculosis Cardiovascular Medical History: Positive for: Hypertension Negative for: Angina; Arrhythmia; Congestive Heart Failure; Coronary Artery Disease; Deep Vein Thrombosis; Hypotension; Myocardial Infarction; Peripheral Arterial Disease; Peripheral Venous  Disease; Phlebitis; Vasculitis Gastrointestinal Medical History: Negative for: Cirrhosis ; Colitis; Crohnos; Hepatitis A; Hepatitis B Endocrine Medical History: Positive for: Type II Diabetes Negative for: Type I Diabetes Time with diabetes: 3 Treated with: Oral agents Blood sugar tested every day: No Genitourinary Medical History: Negative for: End Stage Renal Disease Immunological Medical History: Negative for: Lupus Erythematosus; Raynaudos; Scleroderma Integumentary (Skin) Medical History: Negative for: History of Burn; History of pressure wounds Shane Bean, Shane V. ([id number]) Musculoskeletal Medical History: Negative for: Gout; Rheumatoid Arthritis; Osteoarthritis; Osteomyelitis Neurologic Medical History: Positive for: Neuropathy Negative for: Dementia; Quadriplegia; Paraplegia; Seizure Disorder Oncologic Medical History: Negative for: Received Chemotherapy; Received Radiation Psychiatric Medical History: Negative for: Anorexia/bulimia; Confinement Anxiety Immunizations Pneumococcal Vaccine: Received Pneumococcal Vaccination: Yes Received Pneumococcal Vaccination On or After 60th Birthday: No Implantable Devices None Family and Social History Never smoker; Marital Status - Single; Alcohol Use: Moderate; Drug Use: No History; Caffeine Use: Never; Financial Concerns: No; Food, Clothing or Shelter Needs: No; Support System Lacking: No; Transportation Concerns: No Electronic Signature(s) Signed: 08/26/2020 3:01:21 PM By: Kalman Shan DO Signed: 08/31/2020 1:47:40 PM By: Carlene Coria RN Entered By: Kalman Shan on 08/26/2020 14:58:14 Shane Bean ([id number]) -------------------------------------------------------------------------------- SuperBill Details Patient Name: Shane Bean Date of Service: 08/26/2020 Medical Record Number: [id number] Patient Account Number: [id number] Date of Birth/Sex: [birth year] (64 y.o. M) Treating RN: Carlene Coria Primary Care Provider: Reeves Dam Other Clinician: Referring Provider: Reeves Dam Treating Provider/Extender: Yaakov Guthrie in Treatment: 14 Diagnosis Coding ICD-10 Codes Code Description E11.622 Type 2 diabetes mellitus with other skin ulcer I87.2 Venous insufficiency (chronic) (peripheral) 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 layer exposed I10 Essential (primary) hypertension I87.331 Chronic venous hypertension (idiopathic) with ulcer and inflammation of right lower extremity Facility Procedures CPT4 Code Description: [id number] - DEB SUBQ TISSUE 20 SQ CM/< Modifier: Quantity: 1 CPT4 Code Description: ICD-10 Diagnosis Description I87.331 Chronic venous hypertension (idiopathic) with ulcer and inflammation of 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 laye E11.622 Type 2 diabetes mellitus with  other skin ulcer Modifier: right lower extremi r exposed Quantity: ty Physician Procedures CPT4 Code Description: [id number] - WC PHYS SUBQ TISS 20 SQ CM Modifier: Quantity: 1 CPT4 Code Description: ICD-10 Diagnosis Description I87.331 Chronic venous hypertension (idiopathic) with ulcer and inflammation of L97.822 Non-pressure  chronic ulcer of other part of left lower leg with fat laye E11.622 Type 2 diabetes mellitus with  other skin ulcer Modifier: right lower extremit r exposed Quantity: y Engineer, maintenance) Signed: 08/26/2020 3:01:21 PM By: Kalman Shan DO Entered By: Kalman Shan on 08/26/2020 15:00:52

## 2020-08-31 NOTE — Progress Notes (Signed)
[name], [name] ([id number]) Visit Report for 08/26/2020 Arrival Information Details Patient Name: Shane Bean, Shane Bean Date of Service: 08/26/2020 2:15 PM Medical Record Number: [id number] Patient Account Number: [id number] Date of Birth/Sex: 19-Jan-1955 (64 y.o. M) Treating RN: Carlene Coria Primary Care Provider: Reeves Dam Other Clinician: Referring Provider: Reeves Dam Treating Provider/Extender: Yaakov Guthrie in Treatment: 14 Visit Information History Since Last Visit All ordered tests and consults were completed: No Patient Arrived: Ambulatory Added or deleted any medications: No Arrival Time: 14:20 Any new allergies or adverse reactions: No Accompanied By: self Had a fall or experienced change in No Transfer Assistance: None activities of daily living that may affect Patient Identification Verified: Yes risk of falls: Secondary Verification Process Completed: Yes Signs or symptoms of abuse/neglect since last visito No Patient Requires Transmission-Based No Hospitalized since last visit: No Precautions: Implantable device outside of the clinic excluding No Patient Has Alerts: Yes cellular tissue based products placed in the center Patient Alerts: Patient on Blood since last visit: Thinner Has Dressing in Place as Prescribed: Yes DIABETIC Pain Present Now: No TBI left 0.76 TBI right 0.90 Electronic Signature(s) Signed: 08/31/2020 1:47:40 PM By: Carlene Coria RN Entered By: Carlene Coria on 08/26/2020 14:23:14 Shane Bean ([id number]) -------------------------------------------------------------------------------- Clinic Level of Care Assessment Details Patient Name: Shane Bean Date of Service: 08/26/2020 2:15 PM Medical Record Number: [id number] Patient Account Number: [id number] Date of Birth/Sex: 01/30/1955 (64 y.o. M) Treating RN: Carlene Coria Primary Care Provider: Reeves Dam Other Clinician: Referring Provider: Reeves Dam Treating Provider/Extender: Yaakov Guthrie in Treatment: 14 Clinic Level of Care Assessment Items TOOL 1 Quantity Score [] - Use when EandM and Procedure is performed on INITIAL visit 0 ASSESSMENTS - Nursing Assessment / Reassessment [] - General Physical Exam (combine w/ comprehensive assessment (listed just below) when performed on new 0 pt. evals) [] - 0 Comprehensive Assessment (HX, ROS, Risk Assessments, Wounds Hx, etc.) ASSESSMENTS - Wound and Skin Assessment / Reassessment [] - Dermatologic / Skin Assessment (not related to wound area) 0 ASSESSMENTS - Ostomy and/or Continence Assessment and Care [] - Incontinence Assessment and Management 0 [] - 0 Ostomy Care Assessment and Management (repouching, etc.) PROCESS - Coordination of Care [] - Simple Patient / Family Education for ongoing care 0 [] - 0 Complex (extensive) Patient / Family Education for ongoing care [] - 0 Staff obtains Programmer, systems, Records, Test Results / Process Orders [] - 0 Staff telephones HHA, Nursing Homes / Clarify orders / etc [] - 0 Routine Transfer to another Facility (non-emergent condition) [] - 0 Routine Hospital Admission (non-emergent condition) [] - 0 New Admissions / Biomedical engineer / Ordering NPWT, Apligraf, etc. [] - 0 Emergency Hospital Admission (emergent condition) PROCESS - Special Needs [] - Pediatric / Minor Patient Management 0 [] - 0 Isolation Patient Management [] - 0 Hearing / Language / Visual special needs [] - 0 Assessment of Community assistance (transportation, D/C planning, etc.) [] - 0 Additional assistance / Altered mentation [] - 0 Support Surface(s) Assessment (bed, cushion, seat, etc.) INTERVENTIONS - Miscellaneous [] - External ear exam 0 [] - 0 Patient Transfer (multiple staff / Civil Service fast streamer / Similar devices) [] - 0 Simple Staple / Suture removal (25 or less) [] - 0 Complex Staple / Suture removal (26 or more) [] -  0 Hypo/Hyperglycemic Management (do not check if billed separately) [] - 0 Ankle / Brachial Index (ABI) - do not check if billed separately Has the patient been seen  at the hospital within the last three years: Yes Total Score: 0 Level Of Care: ____ Shane Bean ([id number]) Electronic Signature(s) Signed: 08/31/2020 1:47:40 PM By: Carlene Coria RN Entered By: Carlene Coria on 08/26/2020 14:58:42 Shane Bean ([id number]) -------------------------------------------------------------------------------- Encounter Discharge Information Details Patient Name: Shane Bean Date of Service: 08/26/2020 2:15 PM Medical Record Number: [id number] Patient Account Number: [id number] Date of Birth/Sex: Oct 30, 1955 (64 y.o. M) Treating RN: Carlene Coria Primary Care Provider: Reeves Dam Other Clinician: Referring Provider: Reeves Dam Treating Provider/Extender: Yaakov Guthrie in Treatment: 14 Encounter Discharge Information Items Post Procedure Vitals Discharge Condition: Stable Temperature (F): 98.4 Ambulatory Status: Ambulatory Pulse (bpm): 71 Discharge Destination: Home Respiratory Rate (breaths/min): 18 Transportation: Private Auto Blood Pressure (mmHg): 136/71 Accompanied By: self Schedule Follow-up Appointment: No Clinical Summary of Care: Electronic Signature(s) Signed: 08/31/2020 1:47:40 PM By: Carlene Coria RN Entered By: Carlene Coria on 08/26/2020 14:55:13 Shane Bean ([id number]) -------------------------------------------------------------------------------- Lower Extremity Assessment Details Patient Name: Shane Bean Date of Service: 08/26/2020 2:15 PM Medical Record Number: [id number] Patient Account Number: [id number] Date of Birth/Sex: [birth year] (64 y.o. M) Treating RN: Carlene Coria Primary Care Provider: Reeves Dam Other Clinician: Referring Provider: Reeves Dam Treating Provider/Extender: Yaakov Guthrie in  Treatment: 14 Edema Assessment Assessed: [Left: No] Shane Bean: No] Edema: [Left: Ye] [Right: s] Calf Left: Right: Point of Measurement: 34 cm From Medial Instep 40 cm Ankle Left: Right: Point of Measurement: 10 cm From Medial Instep 22 cm Knee To Floor Left: Right: From Medial Instep 45 cm Vascular Assessment Pulses: Dorsalis Pedis Palpable: [Left:Yes] Electronic Signature(s) Signed: 08/31/2020 1:47:40 PM By: Carlene Coria RN Entered By: Carlene Coria on 08/26/2020 14:56:06 Shane Bean ([id number]) -------------------------------------------------------------------------------- Multi Wound Chart Details Patient Name: Shane Bean Date of Service: 08/26/2020 2:15 PM Medical Record Number: [id number] Patient Account Number: [id number] Date of Birth/Sex: 10-17-1955 (64 y.o. M) Treating RN: Carlene Coria Primary Care Provider: Reeves Dam Other Clinician: Referring Provider: Reeves Dam Treating Provider/Extender: Yaakov Guthrie in Treatment: 14 Vital Signs Height(in): 70 Pulse(bpm): 20 Weight(lbs): 290 Blood Pressure(mmHg): 136/71 Body Mass Index(BMI): 42 Temperature(F): 98.4 Respiratory Rate(breaths/min): 18 Photos: [N/A:N/A] Wound Location: Left, Posterior Lower Leg N/A N/A Wounding Event: Gradually Appeared N/A N/A Primary Etiology: Diabetic Wound/Ulcer of the Lower N/A N/A Extremity Comorbid History: Hypertension, Type II Diabetes, N/A N/A Neuropathy Date Acquired: 04/08/2020 N/A N/A Weeks of Treatment: 14 N/A N/A Wound Status: Open N/A N/A Measurements L x W x D (cm) 5x2.5x0.2 N/A N/A Area (cm) : 9.817 N/A N/A Volume (cm) : 1.963 N/A N/A % Reduction in Area: 10.70% N/A N/A % Reduction in Volume: -78.50% N/A N/A Classification: Grade 2 N/A N/A Exudate Amount: Large N/A N/A Exudate Type: Serosanguineous N/A N/A Exudate Color: red, brown N/A N/A Granulation Amount: Medium (34-66%) N/A N/A Granulation Quality: Red, Pink N/A N/A Necrotic  Amount: Medium (34-66%) N/A N/A Necrotic Tissue: Eschar, Adherent Slough N/A N/A Exposed Structures: Fat Layer (Subcutaneous Tissue): N/A N/A Yes Fascia: No Tendon: No Muscle: No Joint: No Bone: No Epithelialization: Medium (34-66%) N/A N/A Debridement: Debridement - Excisional N/A N/A Pre-procedure Verification/Time 14:50 N/A N/A Out Taken: Pain Control: Lidocaine 4% Topical Solution N/A N/A Tissue Debrided: Subcutaneous, Slough N/A N/A Level: Skin/Subcutaneous Tissue N/A N/A Debridement Area (sq cm): 6.25 N/A N/A Instrument: Curette N/A N/A Bleeding: Minimum N/A N/A Hemostasis Achieved: Pressure N/A N/A Procedural Pain: 4 N/A N/A Post Procedural Pain: 2 N/A N/A Shane Bean, Shane Bean Kitchen ([id number]) Debridement Treatment Procedure was tolerated well N/A N/A Response: Post  Debridement 5x2.5x0.2 N/A N/A Measurements L x W x D (cm) Post Debridement Volume: 1.963 N/A N/A (cm) Procedures Performed: Debridement N/A N/A Treatment Notes Wound #1 (Lower Leg) Wound Laterality: Left, Posterior Cleanser Soap and Water Discharge Instruction: Gently cleanse wound with antibacterial soap, rinse and pat dry prior to dressing wounds Peri-Wound Care Triamcinolone Acetonide Cream, 0.1%, 15 (g) tube Discharge Instruction: peri wound Topical Santyl Collagenase Ointment, 30 (gm), tube Discharge Instruction: Apply to wound in clinic only. Primary Dressing Hydrofera Blue Ready Transfer Foam, 2.5x2.5 (in/in) Discharge Instruction: Apply Hydrofera Blue Ready to wound bed as directed Secondary Dressing ABD Pad 5x9 (in/in) Discharge Instruction: Cover with ABD pad Secured With Compression Wrap Profore Lite LF 3 Multilayer Compression Berry Discharge Instruction: Apply 3 multi-layer wrap as prescribed. Compression Stockings Add-Ons Electronic Signature(s) Signed: 08/26/2020 3:01:21 PM By: Kalman Shan DO Entered By: Kalman Shan on 08/26/2020 14:57:20 Shane Bean  ([id number]) -------------------------------------------------------------------------------- Multi-Disciplinary Care Plan Details Patient Name: Shane Bean Date of Service: 08/26/2020 2:15 PM Medical Record Number: [id number] Patient Account Number: [id number] Date of Birth/Sex: [birth year] (64 y.o. M) Treating RN: Carlene Coria Primary Care Provider: Reeves Dam Other Clinician: Referring Provider: Reeves Dam Treating Provider/Extender: Yaakov Guthrie in Treatment: 14 Active Inactive Wound/Skin Impairment Nursing Diagnoses: Impaired tissue integrity Knowledge deficit related to smoking impact on wound healing Knowledge deficit related to ulceration/compromised skin integrity Goals: Patient/caregiver will verbalize understanding of skin care regimen Date Initiated: 05/18/2020 Date Inactivated: 07/08/2020 Target Resolution Date: 06/19/2020 Goal Status: Met Ulcer/skin breakdown will have a volume reduction of 30% by week 4 Date Initiated: 05/18/2020 Date Inactivated: 07/08/2020 Target Resolution Date: 06/15/2020 Goal Status: Met Ulcer/skin breakdown will have a volume reduction of 50% by week 8 Date Initiated: 05/18/2020 Date Inactivated: 07/15/2020 Target Resolution Date: 07/13/2020 Goal Status: Unmet Unmet Reason: con't tx Ulcer/skin breakdown will have a volume reduction of 80% by week 12 Date Initiated: 05/18/2020 Target Resolution Date: 08/10/2020 Goal Status: Active Interventions: Assess patient/caregiver ability to obtain necessary supplies Assess patient/caregiver ability to perform ulcer/skin care regimen upon admission and as needed Assess ulceration(s) every visit Notes: Electronic Signature(s) Signed: 08/31/2020 1:47:40 PM By: Carlene Coria RN Entered By: Carlene Coria on 08/26/2020 14:50:46 Shane Bean ([id number]) -------------------------------------------------------------------------------- Pain Assessment Details Patient Name: Shane Bean Date of Service: 08/26/2020 2:15 PM Medical Record Number: [id number] Patient Account Number: [id number] Date of Birth/Sex: Nov 20, 1955 (64 y.o. M) Treating RN: Carlene Coria Primary Care Provider: Reeves Dam Other Clinician: Referring Provider: Reeves Dam Treating Provider/Extender: Yaakov Guthrie in Treatment: 14 Active Problems Location of Pain Severity and Description of Pain Patient Has Paino No Site Locations Pain Management and Medication Current Pain Management: Electronic Signature(s) Signed: 08/31/2020 1:47:40 PM By: Carlene Coria RN Entered By: Carlene Coria on 08/26/2020 14:23:45 Shane Bean ([id number]) -------------------------------------------------------------------------------- Patient/Caregiver Education Details Patient Name: Shane Bean Date of Service: 08/26/2020 2:15 PM Medical Record Number: [id number] Patient Account Number: [id number] Date of Birth/Gender: 01/20/1955 (64 y.o. M) Treating RN: Carlene Coria Primary Care Physician: Reeves Dam Other Clinician: Referring Physician: Reeves Dam Treating Physician/Extender: Yaakov Guthrie in Treatment: 14 Education Assessment Education Provided To: Patient Education Topics Provided Wound/Skin Impairment: Methods: Explain/Verbal Responses: State content correctly Electronic Signature(s) Signed: 08/31/2020 1:47:40 PM By: Carlene Coria RN Entered By: Carlene Coria on 08/26/2020 14:51:52 Shane Bean ([id number]) -------------------------------------------------------------------------------- Wound Assessment Details Patient Name: Shane Bean Date of Service: 08/26/2020 2:15 PM Medical Record Number: [id number] Patient Account Number: [id number] Date of Birth/Sex: 1955-02-19 (64 y.o.  M) Treating RN: Carlene Coria Primary Care Provider: Reeves Dam Other Clinician: Referring Provider: Reeves Dam Treating Provider/Extender: Yaakov Guthrie in Treatment:  14 Wound Status Wound Number: 1 Primary Etiology: Diabetic Wound/Ulcer of the Lower Extremity Wound Location: Left, Posterior Lower Leg Wound Status: Open Wounding Event: Gradually Appeared Comorbid History: Hypertension, Type II Diabetes, Neuropathy Date Acquired: 04/08/2020 Weeks Of Treatment: 14 Clustered Wound: No Photos Wound Measurements Length: (cm) 5 Width: (cm) 2.5 Depth: (cm) 0.2 Area: (cm) 9.817 Volume: (cm) 1.963 % Reduction in Area: 10.7% % Reduction in Volume: -78.5% Epithelialization: Medium (34-66%) Tunneling: No Undermining: No Wound Description Classification: Grade 2 Exudate Amount: Large Exudate Type: Serosanguineous Exudate Color: red, brown Foul Odor After Cleansing: No Slough/Fibrino Yes Wound Bed Granulation Amount: Medium (34-66%) Exposed Structure Granulation Quality: Red, Pink Fascia Exposed: No Necrotic Amount: Medium (34-66%) Fat Layer (Subcutaneous Tissue) Exposed: Yes Necrotic Quality: Eschar, Adherent Slough Tendon Exposed: No Muscle Exposed: No Joint Exposed: No Bone Exposed: No Treatment Notes Wound #1 (Lower Leg) Wound Laterality: Left, Posterior Cleanser Soap and Water Discharge Instruction: Gently cleanse wound with antibacterial soap, rinse and pat dry prior to dressing wounds Peri-Wound Care Triamcinolone Acetonide Cream, 0.1%, 15 (g) tube Widrig, Shane V. ([id number]) Discharge Instruction: peri wound Topical Santyl Collagenase Ointment, 30 (gm), tube Discharge Instruction: Apply to wound in clinic only. Primary Dressing Hydrofera Blue Ready Transfer Foam, 2.5x2.5 (in/in) Discharge Instruction: Apply Hydrofera Blue Ready to wound bed as directed Secondary Dressing ABD Pad 5x9 (in/in) Discharge Instruction: Cover with ABD pad Secured With Compression Wrap Profore Lite LF 3 Multilayer Compression Shane Bean Discharge Instruction: Apply 3 multi-layer wrap as prescribed. Compression  Stockings Add-Ons Electronic Signature(s) Signed: 08/31/2020 1:47:40 PM By: Carlene Coria RN Entered By: Carlene Coria on 08/26/2020 14:31:15 Shane Bean ([id number]) -------------------------------------------------------------------------------- Vitals Details Patient Name: Shane Bean Date of Service: 08/26/2020 2:15 PM Medical Record Number: [id number] Patient Account Number: [id number] Date of Birth/Sex: [birth year] (64 y.o. M) Treating RN: Carlene Coria Primary Care Provider: Reeves Dam Other Clinician: Referring Provider: Reeves Dam Treating Provider/Extender: Yaakov Guthrie in Treatment: 14 Vital Signs Time Taken: 14:23 Temperature (F): 98.4 Height (in): 70 Pulse (bpm): 71 Weight (lbs): 290 Respiratory Rate (breaths/min): 18 Body Mass Index (BMI): 41.6 Blood Pressure (mmHg): 136/71 Reference Range: 80 - 120 mg / dl Electronic Signature(s) Signed: 08/31/2020 1:47:40 PM By: Carlene Coria RN Entered By: Carlene Coria on 08/26/2020 14:23:36

## 2020-09-02 ENCOUNTER — Other Ambulatory Visit: Payer: Self-pay

## 2020-09-02 ENCOUNTER — Encounter (HOSPITAL_BASED_OUTPATIENT_CLINIC_OR_DEPARTMENT_OTHER): Payer: Medicare (Managed Care) | Admitting: Internal Medicine

## 2020-09-02 DIAGNOSIS — L97822 Non-pressure chronic ulcer of other part of left lower leg with fat layer exposed: Secondary | ICD-10-CM

## 2020-09-02 DIAGNOSIS — E11622 Type 2 diabetes mellitus with other skin ulcer: Secondary | ICD-10-CM | POA: Diagnosis not present

## 2020-09-02 NOTE — Progress Notes (Signed)
[facility], [place] ([id number]) Visit Report for 09/02/2020 Arrival Information Details Patient Name: Shane Bean, Shane Bean Date of Service: 09/02/2020 2:15 PM Medical Record Number: [id number] Patient Account Number: [id number] Date of Birth/Sex: 10-07-1955 (65 y.o. M) Treating RN: Donnamarie Poag Primary Care Jarron Curley: Reeves Dam Other Clinician: Referring Emitt Maglione: Reeves Dam Treating Zayne Marovich/Extender: Yaakov Guthrie in Treatment: 15 Visit Information History Since Last Visit Added or deleted any medications: No Patient Arrived: Ambulatory Had a fall or experienced change in No Arrival Time: 14:27 activities of daily living that may affect Accompanied By: self risk of falls: Transfer Assistance: None Hospitalized since last visit: No Patient Identification Verified: Yes Has Dressing in Place as Prescribed: Yes Secondary Verification Process Completed: Yes Has Compression in Place as Prescribed: Yes Patient Requires Transmission-Based No Pain Present Now: Yes Precautions: Patient Has Alerts: Yes Patient Alerts: Patient on Blood Thinner 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 M) Treating RN: Donnamarie Poag Primary Care Stellan Vick: Reeves Dam Other Clinician: Referring Jaana Brodt: Reeves Dam Treating Shasha Buchbinder/Extender: Yaakov Guthrie in Treatment: 15 Clinic Level of Care Assessment Items TOOL 1 Quantity Score []  - Use when EandM and Procedure is performed on INITIAL visit 0 ASSESSMENTS - Nursing Assessment /  Reassessment []  - General Physical Exam (combine w/ comprehensive assessment (listed just below) when performed on new 0 pt. evals) []  - 0 Comprehensive Assessment (HX, ROS, Risk Assessments, Wounds Hx, etc.) ASSESSMENTS - Wound and Skin Assessment / Reassessment []  - Dermatologic / Skin Assessment (not related to wound area) 0 ASSESSMENTS - Ostomy and/or Continence Assessment and Care []  - Incontinence Assessment and Management 0 []  - 0 Ostomy Care Assessment and Management (repouching, etc.) PROCESS - Coordination of Care []  - Simple Patient / Family Education for ongoing care 0 []  - 0 Complex (extensive) Patient / Family Education for ongoing care []  - 0 Staff obtains Programmer, systems, Records, Test Results / Process Orders []  - 0 Staff telephones HHA, Nursing Homes / Clarify orders / etc []  - 0 Routine Transfer to another Facility (non-emergent condition) []  - 0 Routine Hospital Admission (non-emergent condition) []  - 0 New Admissions / Biomedical engineer / Ordering NPWT, Apligraf, etc. []  - 0 Emergency Hospital Admission (emergent condition) PROCESS - Special Needs []  - Pediatric / Minor Patient Management 0 []  - 0 Isolation Patient Management []  - 0 Hearing / Language / Visual special needs []  - 0 Assessment of Community assistance (transportation, D/C planning, etc.) []  - 0 Additional assistance / Altered mentation []  - 0 Support Surface(s) Assessment (bed, cushion, seat, etc.) INTERVENTIONS - Miscellaneous []  - External ear exam 0 []  - 0 Patient Transfer (multiple staff / Civil Service fast streamer / Similar devices) []  - 0 Simple Staple / Suture removal (25 or less) []  - 0 Complex Staple / Suture removal (26 or more) []  - 0 Hypo/Hyperglycemic Management (do not check if billed separately) []  - 0 Ankle / Brachial Index (ABI) - do not check if billed separately Has the patient been seen at the hospital within the last three years: Yes Total Score: 0 Level Of Care:  ____ Shane Bean ([id number]) Electronic Signature(s) Signed: 09/02/2020 3:14:25 PM By: Donnamarie Poag Entered By: Donnamarie Poag on 09/02/2020 15:12:14 Janusz,  Shane Bean ([id number]) -------------------------------------------------------------------------------- Compression Therapy Details Patient Name: Shane Bean Date of Service: 09/02/2020 2:15 PM Medical Record Number: [id number] Patient Account Number: [id number] Date of Birth/Sex: 1955/01/14 (65 y.o. M) Treating RN: Donnamarie Poag Primary Care Terrilynn Postell: Reeves Dam Other Clinician: Referring Audra Bellard: Reeves Dam Treating Aishwarya Shiplett/Extender: Yaakov Guthrie in Treatment: 15 Compression Therapy Performed for Wound Assessment: Wound #1 Left,Posterior Lower Leg Performed By: Clinician Donnamarie Poag, RN Compression Type: Three Layer Post Procedure Diagnosis Same as Pre-procedure Electronic Signature(s) Signed: 09/02/2020 3:14:25 PM By: Donnamarie Poag Entered By: Donnamarie Poag on 09/02/2020 15:08:12 Shane Bean ([id number]) -------------------------------------------------------------------------------- Encounter Discharge Information Details Patient Name: Shane Bean Date of Service: 09/02/2020 2:15 PM Medical Record Number: [id number] Patient Account Number: [id number] Date of Birth/Sex: Jul 17, 1955 (65 y.o. M) Treating RN: Donnamarie Poag Primary Care Pacen Bean: Reeves Dam Other Clinician: Referring Leotha Westermeyer: Reeves Dam Treating Irish Breisch/Extender: Yaakov Guthrie in Treatment: 15 Encounter Discharge Information Items Post Procedure Vitals Discharge Condition: Stable Temperature (F): 98.0 Ambulatory Status: Ambulatory Pulse (bpm): 69 Discharge Destination: Home Respiratory Rate (breaths/min): 16 Transportation: Private Auto Blood Pressure (mmHg): 147/73 Accompanied By: self Schedule Follow-up Appointment: Yes Clinical Summary of Care: Electronic Signature(s) Signed: 09/02/2020 3:14:25 PM  By: Donnamarie Poag Entered By: Donnamarie Poag on 09/02/2020 15:13:30 Shane Bean ([id number]) -------------------------------------------------------------------------------- Lower Extremity Assessment Details Patient Name: Shane Bean Date of Service: 09/02/2020 2:15 PM Medical Record Number: [id number] Patient Account Number: [id number] Date of Birth/Sex: 1955-07-24 (64 y.o. M) Treating RN: Donnamarie Poag Primary Care Anias Bartol: Reeves Dam Other Clinician: Referring Keniah Klemmer: Reeves Dam Treating Hakop Humbarger/Extender: Yaakov Guthrie in Treatment: 15 Edema Assessment Assessed: Shirlyn Goltz: Yes] Patrice Paradise: No] [Left: Edema] [Right: :] Calf Left: Right: Point of Measurement: 34 cm From Medial Instep 40 cm Ankle Left: Right: Point of Measurement: 10 cm From Medial Instep 22 cm Knee To Floor Left: Right: From Medial Instep 45 cm Vascular Assessment Pulses: Dorsalis Pedis Palpable: [Left:Yes] Electronic Signature(s) Signed: 09/02/2020 3:14:25 PM By: Donnamarie Poag Entered By: Donnamarie Poag on 09/02/2020 14:39:31 Shane Bean ([id number]) -------------------------------------------------------------------------------- Multi Wound Chart Details Patient Name: Shane Bean Date of Service: 09/02/2020 2:15 PM Medical Record Number: [id number] Patient Account Number: [id number] Date of Birth/Sex: Apr 05, 1955 (64 y.o. M) Treating RN: Donnamarie Poag Primary Care Cameran Ahmed: Reeves Dam Other Clinician: Referring Cartina Brousseau: Reeves Dam Treating Marena Witts/Extender: Yaakov Guthrie in Treatment: 15 Vital Signs Height(in): 70 Pulse(bpm): 15 Weight(lbs): 290 Blood Pressure(mmHg): 147/73 Body Mass Index(BMI): 42 Temperature(F): 98.0 Respiratory Rate(breaths/min): 16 Photos: [N/A:N/A] Wound Location: Left, Posterior Lower Leg N/A N/A Wounding Event: Gradually Appeared N/A N/A Primary Etiology: Diabetic Wound/Ulcer of the Lower N/A N/A Extremity Comorbid History:  Hypertension, Type II Diabetes, N/A N/A Neuropathy Date Acquired: 04/08/2020 N/A N/A Weeks of Treatment: 15 N/A N/A Wound Status: Open N/A N/A Measurements L x W x D (cm) 2.4x2.5x0.1 N/A N/A Area (cm) : 4.712 N/A N/A Volume (cm) : 0.471 N/A N/A % Reduction in Area: 57.10% N/A N/A % Reduction in Volume: 57.20% N/A N/A Classification: Grade 2 N/A N/A Exudate Amount: Large N/A N/A Exudate Type: Serosanguineous N/A N/A Exudate Color: red, brown N/A N/A Granulation Amount: Medium (34-66%) N/A N/A Granulation Quality: Red, Pink N/A N/A Necrotic Amount: Medium (34-66%) N/A N/A Necrotic Tissue: Eschar, Adherent Slough N/A N/A Exposed Structures: Fat Layer (Subcutaneous Tissue): N/A N/A Yes Fascia: No Tendon: No Muscle: No Joint: No Bone: No Epithelialization: Medium (34-66%) N/A N/A Debridement: Debridement - Excisional N/A N/A Pre-procedure Verification/Time 14:08 N/A N/A Out Taken: Pain Control: Lidocaine N/A N/A  Tissue Debrided: Subcutaneous, Slough N/A N/A Level: Skin/Subcutaneous Tissue N/A N/A Debridement Area (sq cm): 6 N/A N/A Instrument: Curette N/A N/A Bleeding: Minimum N/A N/A Hemostasis Achieved: Pressure N/A N/A Debridement Treatment Procedure was tolerated well N/A N/A ResponseMCKENNON, ZWART ([id number]) Post Debridement 2.4x2.5x0.1 N/A N/A Measurements L x W x D (cm) Post Debridement Volume: 0.471 N/A N/A (cm) Procedures Performed: Compression Therapy N/A N/A Debridement Treatment Notes Wound #1 (Lower Leg) Wound Laterality: Left, Posterior Cleanser Soap and Water Discharge Instruction: Gently cleanse wound with antibacterial soap, rinse and pat dry prior to dressing wounds Peri-Wound Care Topical Primary Dressing Hydrofera Blue Ready Transfer Foam, 2.5x2.5 (in/in) Discharge Instruction: Apply Hydrofera Blue Ready to wound bed as directed Secondary Dressing ABD Pad 5x9 (in/in) Discharge Instruction: Cover with ABD pad Secured With Compression  Wrap Profore Lite LF 3 Multilayer Compression Bandaging System Discharge Instruction: Apply 3 multi-layer wrap as prescribed. Compression Stockings Circaid Juxta Lite Compression Wrap Quantity: 1 Left Leg Compression Amount: 20-30 mmHg Discharge Instruction: Apply Circaid Juxta Lite Compression Wrap as directed Add-Ons Electronic Signature(s) Signed: 09/02/2020 3:48:10 PM By: Kalman Shan DO Previous Signature: 09/02/2020 3:14:25 PM Version By: Donnamarie Poag Entered By: Kalman Shan on 09/02/2020 15:43:40 Shane Bean ([id number]) -------------------------------------------------------------------------------- Multi-Disciplinary Care Plan Details Patient Name: Shane Bean Date of Service: 09/02/2020 2:15 PM Medical Record Number: [id number] Patient Account Number: [id number] Date of Birth/Sex: 10/12/1955 (64 y.o. M) Treating RN: Donnamarie Poag Primary Care Schylar Allard: Reeves Dam Other Clinician: Referring Damen Windsor: Reeves Dam Treating Clerence Gubser/Extender: Yaakov Guthrie in Treatment: 15 Active Inactive Wound/Skin Impairment Nursing Diagnoses: Impaired tissue integrity Knowledge deficit related to smoking impact on wound healing Knowledge deficit related to ulceration/compromised skin integrity Goals: Patient/caregiver will verbalize understanding of skin care regimen Date Initiated: 05/18/2020 Date Inactivated: 07/08/2020 Target Resolution Date: 06/19/2020 Goal Status: Met Ulcer/skin breakdown will have a volume reduction of 30% by week 4 Date Initiated: 05/18/2020 Date Inactivated: 07/08/2020 Target Resolution Date: 06/15/2020 Goal Status: Met Ulcer/skin breakdown will have a volume reduction of 50% by week 8 Date Initiated: 05/18/2020 Date Inactivated: 07/15/2020 Target Resolution Date: 07/13/2020 Goal Status: Unmet Unmet Reason: con't tx Ulcer/skin breakdown will have a volume reduction of 80% by week 12 Date Initiated: 05/18/2020 Target Resolution Date:  08/10/2020 Goal Status: Active Interventions: Assess patient/caregiver ability to obtain necessary supplies Assess patient/caregiver ability to perform ulcer/skin care regimen upon admission and as needed Assess ulceration(s) every visit Notes: Electronic Signature(s) Signed: 09/02/2020 3:14:25 PM By: Donnamarie Poag Entered By: Donnamarie Poag on 09/02/2020 14:39:48 Shane Bean ([id number]) -------------------------------------------------------------------------------- Pain Assessment Details Patient Name: Shane Bean Date of Service: 09/02/2020 2:15 PM Medical Record Number: [id number] Patient Account Number: [id number] Date of Birth/Sex: 03/30/1955 (64 y.o. M) Treating RN: Donnamarie Poag Primary Care Athanasius Kesling: Reeves Dam Other Clinician: Referring Thedore Pickel: Reeves Dam Treating Stehanie Ekstrom/Extender: Yaakov Guthrie in Treatment: 15 Active Problems Location of Pain Severity and Description of Pain Patient Has Paino Yes Site Locations Pain Location: Generalized Pain, Pain in Ulcers Rate the pain. Current Pain Level: 7 Pain Management and Medication Current Pain Management: Electronic Signature(s) Signed: 09/02/2020 3:14:25 PM By: Donnamarie Poag Entered By: Donnamarie Poag on 09/02/2020 14:31:32 Shane Bean ([id number]) -------------------------------------------------------------------------------- Patient/Caregiver Education Details Patient Name: Shane Bean Date of Service: 09/02/2020 2:15 PM Medical Record Number: [id number] Patient Account Number: [id number] Date of Birth/Gender: November 19, 1955 (64 y.o. M) Treating RN: Donnamarie Poag Primary Care Physician: Reeves Dam Other Clinician: Referring Physician: Reeves Dam Treating Physician/Extender: Yaakov Guthrie in Treatment: 15 Education  Assessment Education Provided To: Patient Education Topics Provided Wound Debridement: Wound/Skin Impairment: Electronic Signature(s) Signed: 09/02/2020  3:14:25 PM By: Donnamarie Poag Entered By: Donnamarie Poag on 09/02/2020 15:12:30 Shane Bean ([id number]) -------------------------------------------------------------------------------- Wound Assessment Details Patient Name: Shane Bean Date of Service: 09/02/2020 2:15 PM Medical Record Number: [id number] Patient Account Number: [id number] Date of Birth/Sex: 1955/06/23 (64 y.o. M) Treating RN: Donnamarie Poag Primary Care Tommy Goostree: Reeves Dam Other Clinician: Referring Markesha Hannig: Reeves Dam Treating Yashika Mask/Extender: Yaakov Guthrie in Treatment: 15 Wound Status Wound Number: 1 Primary Etiology: Diabetic Wound/Ulcer of the Lower Extremity Wound Location: Left, Posterior Lower Leg Wound Status: Open Wounding Event: Gradually Appeared Comorbid History: Hypertension, Type II Diabetes, Neuropathy Date Acquired: 04/08/2020 Weeks Of Treatment: 15 Clustered Wound: No Photos Wound Measurements Length: (cm) 2.4 Width: (cm) 2.5 Depth: (cm) 0.1 Area: (cm) 4.712 Volume: (cm) 0.471 % Reduction in Area: 57.1% % Reduction in Volume: 57.2% Epithelialization: Medium (34-66%) Tunneling: No Undermining: No Wound Description Classification: Grade 2 Exudate Amount: Large Exudate Type: Serosanguineous Exudate Color: red, brown Foul Odor After Cleansing: No Slough/Fibrino Yes Wound Bed Granulation Amount: Medium (34-66%) Exposed Structure Granulation Quality: Red, Pink Fascia Exposed: No Necrotic Amount: Medium (34-66%) Fat Layer (Subcutaneous Tissue) Exposed: Yes Necrotic Quality: Eschar, Adherent Slough Tendon Exposed: No Muscle Exposed: No Joint Exposed: No Bone Exposed: No Treatment Notes Wound #1 (Lower Leg) Wound Laterality: Left, Posterior Cleanser Soap and Water Discharge Instruction: Gently cleanse wound with antibacterial soap, rinse and pat dry prior to dressing wounds Peri-Wound Care MORIAH, LOUGHRY ([id number]) Topical Primary Dressing Hydrofera  Blue Ready Transfer Foam, 2.5x2.5 (in/in) Discharge Instruction: Apply Hydrofera Blue Ready to wound bed as directed Secondary Dressing ABD Pad 5x9 (in/in) Discharge Instruction: Cover with ABD pad Secured With Compression Wrap Profore Lite LF 3 Multilayer Compression Bandaging System Discharge Instruction: Apply 3 multi-layer wrap as prescribed. Compression Stockings Circaid Juxta Lite Compression Wrap Quantity: 1 Left Leg Compression Amount: 20-30 mmHg Discharge Instruction: Apply Circaid Juxta Lite Compression Wrap as directed Add-Ons Electronic Signature(s) Signed: 09/02/2020 3:14:25 PM By: Donnamarie Poag Entered By: Donnamarie Poag on 09/02/2020 Pueblitos, Bradley V. ([id number]) -------------------------------------------------------------------------------- [place] Details Patient Name: Shane Bean Date of Service: 09/02/2020 2:15 PM Medical Record Number: [id number] Patient Account Number: [id number] Date of Birth/Sex: 10-Dec-1955 (64 y.o. M) Treating RN: Donnamarie Poag Primary Care Alizeh Madril: Reeves Dam Other Clinician: Referring Lakeasha Petion: Reeves Dam Treating Deneise Getty/Extender: Yaakov Guthrie in Treatment: 15 Vital Signs Time Taken: 14:31 Temperature (F): 98.0 Height (in): 70 Pulse (bpm): 69 Weight (lbs): 290 Respiratory Rate (breaths/min): 16 Body Mass Index (BMI): 41.6 Blood Pressure (mmHg): 147/73 Reference Range: 80 - 120 mg / dl Electronic Signature(s) Signed: 09/02/2020 3:14:25 PM By: Donnamarie Poag Entered ByDonnamarie Poag on 09/02/2020 14:31:23

## 2020-09-03 NOTE — Progress Notes (Signed)
CORRIE, TOMINAGA ([id number]) Visit Report for 09/02/2020 Chief Complaint Document Details Patient Name: Shane Bean, Shane Bean Date of Service: 09/02/2020 2:15 PM Medical Record Number: [id number] Patient Account Number: [id number] Date of Birth/Sex: 1955/08/13 (64 y.o. M) Treating RN: Donnamarie Poag Primary Care Provider: Reeves Dam Other Clinician: Referring Provider: Reeves Dam Treating Provider/Extender: Yaakov Guthrie in Treatment: 15 Information Obtained from: Patient Chief Complaint Left LE Ulcer Electronic Signature(s) Signed: 09/02/2020 3:48:10 PM By: Kalman Shan DO Entered By: Kalman Shan on 09/02/2020 15:43:52 [name] ([id number]) -------------------------------------------------------------------------------- Debridement Details Patient Name: [name] Date of Service: 09/02/2020 2:15 PM Medical Record Number: [id number] Patient Account Number: [id number] Date of Birth/Sex: 1955-10-09 (64 y.o. M) Treating RN: Donnamarie Poag Primary Care Provider: Reeves Dam Other Clinician: Referring Provider: Reeves Dam Treating Provider/Extender: Yaakov Guthrie in Treatment: 15 Debridement Performed for Wound #1 Left,Posterior Lower Leg Assessment: Performed By: Physician Kalman Shan, MD Debridement Type: Debridement Severity of Tissue Pre Debridement: Fat layer exposed Level of Consciousness (Pre- Awake and Alert procedure): Pre-procedure Verification/Time Out Yes - 14:08 Taken: Start Time: 14:08 Pain Control: Lidocaine Total Area Debrided (L x W): 2.4 (cm) x 2.5 (cm) = 6 (cm) Tissue and other material Viable, Non-Viable, Slough, Subcutaneous, Biofilm, Slough debrided: Level: Skin/Subcutaneous Tissue Debridement Description: Excisional Instrument: Curette Bleeding: Minimum Hemostasis Achieved: Pressure End Time: 14:11 Response to Treatment: Procedure was tolerated well Level of Consciousness (Post- Awake and  Alert procedure): Post Debridement Measurements of Total Wound Length: (cm) 2.4 Width: (cm) 2.5 Depth: (cm) 0.1 Volume: (cm) 0.471 Character of Wound/Ulcer Post Debridement: Improved Severity of Tissue Post Debridement: Fat layer exposed Post Procedure Diagnosis Same as Pre-procedure Electronic Signature(s) Signed: 09/02/2020 3:14:25 PM By: Donnamarie Poag Signed: 09/02/2020 3:48:10 PM By: Kalman Shan DO Entered By: Donnamarie Poag on 09/02/2020 15:10:46 [name] ([id number]) -------------------------------------------------------------------------------- HPI Details Patient Name: [name] Date of Service: 09/02/2020 2:15 PM Medical Record Number: [id number] Patient Account Number: [id number] Date of Birth/Sex: 05/26/1955 (64 y.o. M) Treating RN: Donnamarie Poag Primary Care Provider: Reeves Dam Other Clinician: Referring Provider: Reeves Dam Treating Provider/Extender: Yaakov Guthrie in Treatment: 15 History of Present Illness HPI Description: 05/18/2020 upon evaluation today patient appears for initial evaluation here in clinic concerning issues that he has been having with the wound on his left lateral leg. Fortunately there does not appear to be any signs of obvious an active infection at this time which is great news. With that being said the patient unfortunately is continued to have issues with pain although he tells me it is gotten a lot better. He was on Keflex as well as Bactrim DS which seems to have done a good job there. His most recent hemoglobin A1c was 9.8 that was on 04/14/2020. Subsequently I do feel like that the patient is making progress here. He does have a history of chronic venous insufficiency as well as hypertension. I do not see any need for antibiotics at this point. 5/25; follow-up of the wound on the left posterior calf. This is completely necrotic on the surface. It does not look infected but it is painful. He is a diabetic but I  think there is some suggestion here of chronic venous disease as well. We used Iodoflex under compression last week 6/1; again a completely necrotic surface on this with the wound. We have been using Iodoflex under compression he complains of gnawing pain. He has had wounds previously a lot of this looks like chronic venous disease. 6/8; about two  thirds of the surface of this wound is still covered in a black necrotic eschar. I changed him from Iodoflex to [place] last week because of complaints of pain. He actually was seen by her neurologist this weeko Peripheral neuropathy as a cause of the pain and they changed him from Neurontin to Lyrica but he still has not had any relief. He says that most of the pain however is in his heel not in his wound per se. He is a diabetic 6/15. This is a patient with what looks to be a venous wound on the left lateral lower leg. He is also a diabetic. Currently being worked up for diabetic neuropathy. He complains of pain out of proportion to the size of the wound although to be fair the entire area here was eschared. We have been working to get a viable surface. We are using Sorbact 6/22; fairly painful wound on the left posterior calf. I am assuming this is been venous he is also a diabetic. His ABI in our clinic was noncompressible however he has easily palpable pulses on his feet. He complains of a lot of pain in the wound also of the left heel and the upper left calf. Some of this may be neuropathy. It is led me to discontinue Iodoflex reduce his compression but he still complains of pain in fact he says he could not take a debridement today. When I first saw this it was completely necrotic surface we have got it down to something that looks a reasonably healthy I have been wondering about biopsying this area however he is on Coumadin and with the pain I have put this off. The major question would be an inflammatory ulcer such as pyoderma. The patient is not  aware of how this started. He does however have a wound history on both legs. 6/29; patient presents for 1 week follow-up. He has been using Hydrofera Blue under Kerlix/Coban. He has tolerated this well. He currently denies signs of infection. He also declines debridement today. He he states it is painful and does not want to have this done. 7/6; Patient presents for 1 week follow-up. He has been using Hydrofera Blue under Kerlix/Coban. He denies signs of infection. He declines debridement today. 7/15; patient presents for 1 week follow-up. He has been using Hydrofera Blue under Kerlix/Coban. He denies signs of infection today. He is agreeable to having debridement done today. 7/20; patient presents for 1 week follow-up. He is in good spirits today. He has been using Hydrofera Blue under Kerlix/Coban. He denies signs of infection today. 7/27; patient presents for 1 week follow-up. He is in good spirits today. He has no issues or complaints today. He denies signs of infection. 08/12/2020 upon evaluation today patient appears to be doing better in regard to his wound. He has been tolerating the dressing changes without complication. With that being said he did have arterial studies and does show that he has a TBI on the right of 0.90 and a TBI on the left of 0.76 which is excellent. There is no evidence of arterial compromise at this point. 8/17; patient presents for 1 week follow-up. He has no issues or complaints today. He denies signs of infection. 8/24; patient presents for 1 week follow-up. He has no issues or complaints today. He denies signs of infection. He continues to tolerate the compression wrap well. 8/31; patient presents for 1 week follow-up. He has no issues or complaints today. He denies signs of infection. He is  in good spirits today. Electronic Signature(s) Signed: 09/02/2020 3:48:10 PM By: Kalman Shan DO Entered By: Kalman Shan on 09/02/2020 15:44:22 [name]  ([id number]) -------------------------------------------------------------------------------- Physical Exam Details Patient Name: [name] Date of Service: 09/02/2020 2:15 PM Medical Record Number: [id number] Patient Account Number: [id number] Date of Birth/Sex: 06/02/1955 (64 y.o. M) Treating RN: Donnamarie Poag Primary Care Provider: Reeves Dam Other Clinician: Referring Provider: Reeves Dam Treating Provider/Extender: Yaakov Guthrie in Treatment: 15 Constitutional . Cardiovascular . Psychiatric . Notes Left lower extremity: To the posterior aspect there is an open wound with nonviable tissue and granulation tissue present. No signs of infection. Good edema control Electronic Signature(s) Signed: 09/02/2020 3:48:10 PM By: Kalman Shan DO Entered By: Kalman Shan on 09/02/2020 15:44:51 [name] ([id number]) -------------------------------------------------------------------------------- Physician Orders Details Patient Name: [name] Date of Service: 09/02/2020 2:15 PM Medical Record Number: [id number] Patient Account Number: [id number] Date of Birth/Sex: 08/06/1955 (64 y.o. M) Treating RN: Donnamarie Poag Primary Care Provider: Reeves Dam Other Clinician: Referring Provider: Reeves Dam Treating Provider/Extender: Yaakov Guthrie in Treatment: 15 Verbal / Phone Orders: No Diagnosis Coding Follow-up Appointments o Return Appointment in 1 week. o Nurse Visit as needed Bathing/ Shower/ Hygiene Wound #1 Left,Posterior Lower Leg o May shower with wound dressing protected with water repellent cover or cast protector. - cover dressing during shower-do not get it wet Edema Control - Lymphedema / Segmental Compressive Device / Other o Elevate, Exercise Daily and Avoid Standing for Long Periods of Time. o Elevate legs to the level of the heart and pump ankles as often as possible o Elevate leg(s) parallel to the  floor when sitting. o DO YOUR BEST to sleep in the bed at night. DO NOT sleep in your recliner. Long hours of sitting in a recliner leads to swelling of the legs and/or potential wounds on your backside. Additional Orders / Instructions o Follow Nutritious Diet and Increase Protein Intake Wound Treatment Wound #1 - Lower Leg Wound Laterality: Left, Posterior Cleanser: Soap and Water 1 x Per Week/30 Days Discharge Instructions: Gently cleanse wound with antibacterial soap, rinse and pat dry prior to dressing wounds Primary Dressing: Hydrofera Blue Ready Transfer Foam, 2.5x2.5 (in/in) 1 x Per Week/30 Days Discharge Instructions: Apply Hydrofera Blue Ready to wound bed as directed Secondary Dressing: ABD Pad 5x9 (in/in) 1 x Per Week/30 Days Discharge Instructions: Cover with ABD pad Compression Wrap: Profore Lite LF 3 Multilayer Compression Bandaging System 1 x Per Week/30 Days Discharge Instructions: Apply 3 multi-layer wrap as prescribed. Compression Stockings: Circaid Juxta Lite Compression Wrap Left Leg Compression Amount: 20-30 mmHG Discharge Instructions: Apply Circaid Juxta Lite Compression Wrap as directed Electronic Signature(s) Signed: 09/02/2020 3:14:25 PM By: Donnamarie Poag Signed: 09/02/2020 3:48:10 PM By: Kalman Shan DO Entered By: Donnamarie Poag on 09/02/2020 15:12:07 [name] ([id number]) -------------------------------------------------------------------------------- Problem List Details Patient Name: [name] Date of Service: 09/02/2020 2:15 PM Medical Record Number: [id number] Patient Account Number: [id number] Date of Birth/Sex: 1955/11/29 (64 y.o. M) Treating RN: Donnamarie Poag Primary Care Provider: Reeves Dam Other Clinician: Referring Provider: Reeves Dam Treating Provider/Extender: Yaakov Guthrie in Treatment: 15 Active Problems ICD-10 Encounter Code Description Active Date MDM Diagnosis E11.622 Type 2 diabetes mellitus with  other skin ulcer 05/18/2020 No Yes I87.2 Venous insufficiency (chronic) (peripheral) 05/18/2020 No Yes 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 layer 05/18/2020 No Yes exposed [place] (primary) hypertension 05/18/2020 No Yes I87.331 Chronic venous hypertension (idiopathic) with  ulcer and inflammation of 08/26/2020 No Yes right lower extremity Inactive Problems Resolved Problems Electronic Signature(s) Signed: 09/02/2020 3:48:10 PM By: Kalman Shan DO Entered By: Kalman Shan on 09/02/2020 15:43:33 [name] ([id number]) -------------------------------------------------------------------------------- Progress Note Details Patient Name: [name] Date of Service: 09/02/2020 2:15 PM Medical Record Number: [id number] Patient Account Number: [id number] Date of Birth/Sex: [birth year] (64 y.o. M) Treating RN: Donnamarie Poag Primary Care Provider: Reeves Dam Other Clinician: Referring Provider: Reeves Dam Treating Provider/Extender: Yaakov Guthrie in Treatment: 15 Subjective Chief Complaint Information obtained from Patient Left LE Ulcer History of Present Illness (HPI) 05/18/2020 upon evaluation today patient appears for initial evaluation here in clinic concerning issues that he has been having with the wound on his left lateral leg. Fortunately there does not appear to be any signs of obvious an active infection at this time which is great news. With that being said the patient unfortunately is continued to have issues with pain although he tells me it is gotten a lot better. He was on Keflex as well as Bactrim DS which seems to have done a good job there. His most recent hemoglobin A1c was 9.8 that was on 04/14/2020. Subsequently I do feel like that the patient is making progress here. He does have a history of chronic venous insufficiency as well as hypertension. I do not see any need for antibiotics at this  point. 5/25; follow-up of the wound on the left posterior calf. This is completely necrotic on the surface. It does not look infected but it is painful. He is a diabetic but I think there is some suggestion here of chronic venous disease as well. We used Iodoflex under compression last week 6/1; again a completely necrotic surface on this with the wound. We have been using Iodoflex under compression he complains of gnawing pain. He has had wounds previously a lot of this looks like chronic venous disease. 6/8; about two thirds of the surface of this wound is still covered in a black necrotic eschar. I changed him from Iodoflex to [place] last week because of complaints of pain. He actually was seen by her neurologist this weeko Peripheral neuropathy as a cause of the pain and they changed him from Neurontin to Lyrica but he still has not had any relief. He says that most of the pain however is in his heel not in his wound per se. He is a diabetic 6/15. This is a patient with what looks to be a venous wound on the left lateral lower leg. He is also a diabetic. Currently being worked up for diabetic neuropathy. He complains of pain out of proportion to the size of the wound although to be fair the entire area here was eschared. We have been working to get a viable surface. We are using Sorbact 6/22; fairly painful wound on the left posterior calf. I am assuming this is been venous he is also a diabetic. His ABI in our clinic was noncompressible however he has easily palpable pulses on his feet. He complains of a lot of pain in the wound also of the left heel and the upper left calf. Some of this may be neuropathy. It is led me to discontinue Iodoflex reduce his compression but he still complains of pain in fact he says he could not take a debridement today. When I first saw this it was completely necrotic surface we have got it down to something that looks a reasonably healthy I have been wondering  about biopsying this area however he is on Coumadin and with the pain I have put this off. The major question would be an inflammatory ulcer such as pyoderma. The patient is not aware of how this started. He does however have a wound history on both legs. 6/29; patient presents for 1 week follow-up. He has been using Hydrofera Blue under Kerlix/Coban. He has tolerated this well. He currently denies signs of infection. He also declines debridement today. He he states it is painful and does not want to have this done. 7/6; Patient presents for 1 week follow-up. He has been using Hydrofera Blue under Kerlix/Coban. He denies signs of infection. He declines debridement today. 7/15; patient presents for 1 week follow-up. He has been using Hydrofera Blue under Kerlix/Coban. He denies signs of infection today. He is agreeable to having debridement done today. 7/20; patient presents for 1 week follow-up. He is in good spirits today. He has been using Hydrofera Blue under Kerlix/Coban. He denies signs of infection today. 7/27; patient presents for 1 week follow-up. He is in good spirits today. He has no issues or complaints today. He denies signs of infection. 08/12/2020 upon evaluation today patient appears to be doing better in regard to his wound. He has been tolerating the dressing changes without complication. With that being said he did have arterial studies and does show that he has a TBI on the right of 0.90 and a TBI on the left of 0.76 which is excellent. There is no evidence of arterial compromise at this point. 8/17; patient presents for 1 week follow-up. He has no issues or complaints today. He denies signs of infection. 8/24; patient presents for 1 week follow-up. He has no issues or complaints today. He denies signs of infection. He continues to tolerate the compression wrap well. 8/31; patient presents for 1 week follow-up. He has no issues or complaints today. He denies signs of infection. He  is in good spirits today. Patient History Information obtained from Patient. Social History Never smoker, Marital Status - Single, Alcohol Use - Moderate, Drug Use - No History, Caffeine Use - Never. DESTEN, DETHLEFSEN ([id number]) Medical History Eyes Denies history of Cataracts, Glaucoma, Optic Neuritis Ear/Nose/Mouth/Throat Denies history of Chronic sinus problems/congestion Hematologic/Lymphatic Denies history of Anemia, Hemophilia, Human Immunodeficiency Virus, Lymphedema Respiratory Denies history of Aspiration, Asthma, Chronic Obstructive Pulmonary Disease (COPD), Pneumothorax, Sleep Apnea, Tuberculosis Cardiovascular Patient has history of Hypertension Denies history of Angina, Arrhythmia, Congestive Heart Failure, Coronary Artery Disease, Deep Vein Thrombosis, Hypotension, Myocardial Infarction, Peripheral Arterial Disease, Peripheral Venous Disease, Phlebitis, Vasculitis Gastrointestinal Denies history of Cirrhosis , Colitis, Crohn s, Hepatitis A, Hepatitis B Endocrine Patient has history of Type II Diabetes Denies history of Type I Diabetes Genitourinary Denies history of End Stage Renal Disease Immunological Denies history of Lupus Erythematosus, Raynaud s, Scleroderma Integumentary (Skin) Denies history of History of Burn, History of pressure wounds Musculoskeletal Denies history of Gout, Rheumatoid Arthritis, Osteoarthritis, Osteomyelitis Neurologic Patient has history of Neuropathy Denies history of Dementia, Quadriplegia, Paraplegia, Seizure Disorder Oncologic Denies history of Received Chemotherapy, Received Radiation Psychiatric Denies history of Anorexia/bulimia, Confinement Anxiety Objective Constitutional Vitals Time Taken: 2:31 PM, Height: 70 in, Weight: 290 lbs, BMI: 41.6, Temperature: 98.0 F, Pulse: 69 bpm, Respiratory Rate: 16 breaths/min, Blood Pressure: 147/73 mmHg. General Notes: Left lower extremity: To the posterior aspect there is an open  wound with nonviable tissue and granulation tissue present. No signs of infection. Good edema control Integumentary (Hair, Skin) Wound #1 status is  Open. Original cause of wound was Gradually Appeared. The date acquired was: 04/08/2020. The wound has been in treatment 15 weeks. The wound is located on the Left,Posterior Lower Leg. The wound measures 2.4cm length x 2.5cm width x 0.1cm depth; 4.712cm^2 area and 0.471cm^3 volume. There is Fat Layer (Subcutaneous Tissue) exposed. There is no tunneling or undermining noted. There is a large amount of serosanguineous drainage noted. There is medium (34-66%) red, pink granulation within the wound bed. There is a medium (34-66%) amount of necrotic tissue within the wound bed including Eschar and Adherent Slough. Assessment Active Problems ICD-10 Type 2 diabetes mellitus with other skin ulcer Venous insufficiency (chronic) (peripheral) Non-pressure chronic ulcer of other part of left lower leg with fat layer exposed Essential (primary) hypertension Chronic venous hypertension (idiopathic) with ulcer and inflammation of right lower extremity Ammons, Romulus V. ([id number]) Patient's wound continues to show improvement in size and appearance. I debrided nonviable tissue. No signs of infection on exam. Patient had good granulation tissue postdebridement. I recommended Hydrofera Blue under compression therapy. Procedures Wound #1 Pre-procedure diagnosis of Wound #1 is a Diabetic Wound/Ulcer of the Lower Extremity located on the Left,Posterior Lower Leg .Severity of Tissue Pre Debridement is: Fat layer exposed. There was a Excisional Skin/Subcutaneous Tissue Debridement with a total area of 6 sq cm performed by Kalman Shan, MD. With the following instrument(s): Curette to remove Viable and Non-Viable tissue/material. Material removed includes Subcutaneous Tissue, Slough, and Biofilm after achieving pain control using Lidocaine. A time out was conducted  at 14:08, prior to the start of the procedure. A Minimum amount of bleeding was controlled with Pressure. The procedure was tolerated well. Post Debridement Measurements: 2.4cm length x 2.5cm width x 0.1cm depth; 0.471cm^3 volume. Character of Wound/Ulcer Post Debridement is improved. Severity of Tissue Post Debridement is: Fat layer exposed. Post procedure Diagnosis Wound #1: Same as Pre-Procedure Pre-procedure diagnosis of Wound #1 is a Diabetic Wound/Ulcer of the Lower Extremity located on the Left,Posterior Lower Leg . There was a Three Layer Compression Therapy Procedure by Donnamarie Poag, RN. Post procedure Diagnosis Wound #1: Same as Pre-Procedure Plan Follow-up Appointments: Return Appointment in 1 week. Nurse Visit as needed Bathing/ Shower/ Hygiene: Wound #1 Left,Posterior Lower Leg: May shower with wound dressing protected with water repellent cover or cast protector. - cover dressing during shower-do not get it wet Edema Control - Lymphedema / Segmental Compressive Device / Other: Elevate, Exercise Daily and Avoid Standing for Long Periods of Time. Elevate legs to the level of the heart and pump ankles as often as possible Elevate leg(s) parallel to the floor when sitting. DO YOUR BEST to sleep in the bed at night. DO NOT sleep in your recliner. Long hours of sitting in a recliner leads to swelling of the legs and/or potential wounds on your backside. Additional Orders / Instructions: Follow Nutritious Diet and Increase Protein Intake WOUND #1: - Lower Leg Wound Laterality: Left, Posterior Cleanser: Soap and Water 1 x Per Week/30 Days Discharge Instructions: Gently cleanse wound with antibacterial soap, rinse and pat dry prior to dressing wounds Primary Dressing: Hydrofera Blue Ready Transfer Foam, 2.5x2.5 (in/in) 1 x Per Week/30 Days Discharge Instructions: Apply Hydrofera Blue Ready to wound bed as directed Secondary Dressing: ABD Pad 5x9 (in/in) 1 x Per Week/30  Days Discharge Instructions: Cover with ABD pad Compression Wrap: Profore Lite LF 3 Multilayer Compression Bandaging System 1 x Per Week/30 Days Discharge Instructions: Apply 3 multi-layer wrap as prescribed. Compression Stockings: Circaid Juxta Lite Compression  Wrap Compression Amount: 20-30 mmHg (left) Discharge Instructions: Apply Circaid Juxta Lite Compression Wrap as directed 1. Hydrofera Blue under 3 layer compression 2. Follow-up in 1 week Electronic Signature(s) Signed: 09/02/2020 3:48:10 PM By: Kalman Shan DO Entered By: Kalman Shan on 09/02/2020 15:47:22 [name] ([id number]) -------------------------------------------------------------------------------- ROS/PFSH Details Patient Name: [name] Date of Service: 09/02/2020 2:15 PM Medical Record Number: [id number] Patient Account Number: [id number] Date of Birth/Sex: 1955-11-15 (64 y.o. M) Treating RN: Donnamarie Poag Primary Care Provider: Reeves Dam Other Clinician: Referring Provider: Reeves Dam Treating Provider/Extender: Yaakov Guthrie in Treatment: 15 Information Obtained From Patient Eyes Medical History: Negative for: Cataracts; Glaucoma; Optic Neuritis Ear/Nose/Mouth/Throat Medical History: Negative for: Chronic sinus problems/congestion Hematologic/Lymphatic Medical History: Negative for: Anemia; Hemophilia; Human Immunodeficiency Virus; Lymphedema Respiratory Medical History: Negative for: Aspiration; Asthma; Chronic Obstructive Pulmonary Disease (COPD); Pneumothorax; Sleep Apnea; Tuberculosis Cardiovascular Medical History: Positive for: Hypertension Negative for: Angina; Arrhythmia; Congestive Heart Failure; Coronary Artery Disease; Deep Vein Thrombosis; Hypotension; Myocardial Infarction; Peripheral Arterial Disease; Peripheral Venous Disease; Phlebitis; Vasculitis Gastrointestinal Medical History: Negative for: Cirrhosis ; Colitis; Crohnos; Hepatitis A;  Hepatitis B Endocrine Medical History: Positive for: Type II Diabetes Negative for: Type I Diabetes Time with diabetes: 3 Treated with: Oral agents Blood sugar tested every day: No Genitourinary Medical History: Negative for: End Stage Renal Disease Immunological Medical History: Negative for: Lupus Erythematosus; Raynaudos; Scleroderma Integumentary (Skin) Medical History: Negative for: History of Burn; History of pressure wounds Levay, Han V. ([id number]) Musculoskeletal Medical History: Negative for: Gout; Rheumatoid Arthritis; Osteoarthritis; Osteomyelitis Neurologic Medical History: Positive for: Neuropathy Negative for: Dementia; Quadriplegia; Paraplegia; Seizure Disorder Oncologic Medical History: Negative for: Received Chemotherapy; Received Radiation Psychiatric Medical History: Negative for: Anorexia/bulimia; Confinement Anxiety Immunizations Pneumococcal Vaccine: Received Pneumococcal Vaccination: Yes Received Pneumococcal Vaccination On or After 60th Birthday: No Implantable Devices None Family and Social History Never smoker; Marital Status - Single; Alcohol Use: Moderate; Drug Use: No History; Caffeine Use: Never; Financial Concerns: No; Food, Clothing or Shelter Needs: No; Support System Lacking: No; Transportation Concerns: No Electronic Signature(s) Signed: 09/02/2020 3:48:10 PM By: Kalman Shan DO Signed: 09/03/2020 8:10:35 AM By: Donnamarie Poag Entered By: Kalman Shan on 09/02/2020 15:44:30 [name] ([id number]) -------------------------------------------------------------------------------- [place] Details Patient Name: [name] Date of Service: 09/02/2020 Medical Record Number: [id number] Patient Account Number: [id number] Date of Birth/Sex: [birth year] (64 y.o. M) Treating RN: Donnamarie Poag Primary Care Provider: Reeves Dam Other Clinician: Referring Provider: Reeves Dam Treating Provider/Extender: Yaakov Guthrie in Treatment: 15 Diagnosis Coding ICD-10 Codes Code Description E11.622 Type 2 diabetes mellitus with other skin ulcer I87.2 Venous insufficiency (chronic) (peripheral) 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 layer exposed I10 Essential (primary) hypertension I87.331 Chronic venous hypertension (idiopathic) with ulcer and inflammation of right lower extremity Facility Procedures CPT4 Code: [id number] Description: [id number] - DEB SUBQ TISSUE 20 SQ CM/< Modifier: Quantity: 1 CPT4 Code: Description: ICD-10 Diagnosis Description 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 layer Modifier: exposed Quantity: Physician Procedures CPT4 Code: [id number] Description: 11042 - WC PHYS SUBQ TISS 20 SQ CM Modifier: Quantity: 1 CPT4 Code: Description: ICD-10 Diagnosis Description 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 layer Modifier: exposed Quantity: Electronic Signature(s) Signed: 09/02/2020 3:48:10 PM By: Kalman Shan DO Previous Signature: 09/02/2020 3:14:25 PM Version By: Donnamarie Poag Entered By: Kalman Shan on 09/02/2020 15:47:42

## 2020-09-09 ENCOUNTER — Other Ambulatory Visit: Payer: Self-pay

## 2020-09-09 ENCOUNTER — Encounter: Payer: Medicare (Managed Care) | Attending: Internal Medicine | Admitting: Internal Medicine

## 2020-09-09 DIAGNOSIS — I872 Venous insufficiency (chronic) (peripheral): Secondary | ICD-10-CM | POA: Diagnosis not present

## 2020-09-09 DIAGNOSIS — I87331 Chronic venous hypertension (idiopathic) with ulcer and inflammation of right lower extremity: Secondary | ICD-10-CM | POA: Insufficient documentation

## 2020-09-09 DIAGNOSIS — I10 Essential (primary) hypertension: Secondary | ICD-10-CM | POA: Diagnosis not present

## 2020-09-09 DIAGNOSIS — L97822 Non-pressure chronic ulcer of other part of left lower leg with fat layer exposed: Secondary | ICD-10-CM | POA: Insufficient documentation

## 2020-09-09 DIAGNOSIS — I972 Postmastectomy lymphedema syndrome: Secondary | ICD-10-CM

## 2020-09-09 DIAGNOSIS — E11622 Type 2 diabetes mellitus with other skin ulcer: Secondary | ICD-10-CM | POA: Insufficient documentation

## 2020-09-09 DIAGNOSIS — E11621 Type 2 diabetes mellitus with foot ulcer: Secondary | ICD-10-CM | POA: Diagnosis not present

## 2020-09-09 DIAGNOSIS — E1142 Type 2 diabetes mellitus with diabetic polyneuropathy: Secondary | ICD-10-CM | POA: Insufficient documentation

## 2020-09-09 NOTE — Progress Notes (Signed)
Shane Bean, Shane Bean ([id number]) Visit Report for 09/09/2020 Arrival Information Details Patient Name: Shane Bean, Shane Bean Date of Service: 09/09/2020 2:15 PM Medical Record Number: [id number] Patient Account Number: [id number] Date of Birth/Sex: [birth year] (65 y.o. M) Treating RN: Donnamarie Poag Primary Care Carlester Kasparek: Reeves Dam Other Clinician: Referring Micala Saltsman: Reeves Dam Treating Temitope Griffing/Extender: Yaakov Guthrie in Treatment: 16 Visit Information History Since Last Visit Added or deleted any medications: No Patient Arrived: Ambulatory Had a fall or experienced change in No Arrival Time: 14:28 activities of daily living that may affect Accompanied By: self risk of falls: Transfer Assistance: None Hospitalized since last visit: No Patient Identification Verified: Yes Has Dressing in Place as Prescribed: Yes Secondary Verification Process Completed: Yes Has Compression in Place as Prescribed: Yes Patient Requires Transmission-Based No Pain Present Now: Yes Precautions: Patient Has Alerts: Yes Patient Alerts: Patient on Blood Thinner 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 M) Treating RN: Donnamarie Poag Primary Care Lezlie Ritchey: Reeves Dam Other Clinician: Referring Taelyr Jantz: Reeves Dam Treating Torey Regan/Extender: Yaakov Guthrie in Treatment: 16 Clinic Level of Care Assessment Items TOOL 1 Quantity Score []  - Use when EandM and Procedure is performed on INITIAL visit 0 ASSESSMENTS - Nursing Assessment /  Reassessment []  - General Physical Exam (combine w/ comprehensive assessment (listed just below) when performed on new 0 pt. evals) []  - 0 Comprehensive Assessment (HX, ROS, Risk Assessments, Wounds Hx, etc.) ASSESSMENTS - Wound and Skin Assessment / Reassessment []  - Dermatologic / Skin Assessment (not related to wound area) 0 ASSESSMENTS - Ostomy and/or Continence Assessment and Care []  - Incontinence Assessment and Management 0 []  - 0 Ostomy Care Assessment and Management (repouching, etc.) PROCESS - Coordination of Care []  - Simple Patient / Family Education for ongoing care 0 []  - 0 Complex (extensive) Patient / Family Education for ongoing care []  - 0 Staff obtains Programmer, systems, Records, Test Results / Process Orders []  - 0 Staff telephones HHA, Nursing Homes / Clarify orders / etc []  - 0 Routine Transfer to another Facility (non-emergent condition) []  - 0 Routine Hospital Admission (non-emergent condition) []  - 0 New Admissions / Biomedical engineer / Ordering NPWT, Apligraf, etc. []  - 0 Emergency Hospital Admission (emergent condition) PROCESS - Special Needs []  - Pediatric / Minor Patient Management 0 []  - 0 Isolation Patient Management []  - 0 Hearing / Language / Visual special needs []  - 0 Assessment of Community assistance (transportation, D/C planning, etc.) []  - 0 Additional assistance / Altered mentation []  - 0 Support Surface(s) Assessment (bed, cushion, seat, etc.) INTERVENTIONS - Miscellaneous []  - External ear exam 0 []  - 0 Patient Transfer (multiple staff / Civil Service fast streamer / Similar devices) []  - 0 Simple Staple / Suture removal (25 or less) []  - 0 Complex Staple / Suture removal (26 or more) []  - 0 Hypo/Hyperglycemic Management (do not check if billed separately) []  - 0 Ankle / Brachial Index (ABI) - do not check if billed separately Has the patient been seen at the hospital within the last three years: Yes Total Score: 0 Level Of Care:  ____ Shane Bean ([id number]) Electronic Signature(s) Signed: 09/09/2020 4:46:08 PM By: Donnamarie Poag Entered ByDonnamarie Poag on 09/09/2020 15:01:01  Shane Bean, Shane Bean ([id number]) -------------------------------------------------------------------------------- Compression Therapy Details Patient Name: Shane Bean, Shane Bean Date of Service: 09/09/2020 2:15 PM Medical Record Number: [id number] Patient Account Number: [id number] Date of Birth/Sex: [birth year] (65 y.o. M) Treating RN: Donnamarie Poag Primary Care Kodee Ravert: Reeves Dam Other Clinician: Referring Arrin Pintor: Reeves Dam Treating Carlo Lorson/Extender: Yaakov Guthrie in Treatment: 16 Compression Therapy Performed for Wound Assessment: Wound #1 Left,Posterior Lower Leg Performed By: Clinician Donnamarie Poag, RN Compression Type: Three Layer Post Procedure Diagnosis Same as Pre-procedure Electronic Signature(s) Signed: 09/09/2020 4:46:08 PM By: Donnamarie Poag Entered By: Donnamarie Poag on 09/09/2020 14:59:38 Shane Bean ([id number]) -------------------------------------------------------------------------------- Encounter Discharge Information Details Patient Name: Shane Bean Date of Service: 09/09/2020 2:15 PM Medical Record Number: [id number] Patient Account Number: [id number] Date of Birth/Sex: 07-Mar-1955 (65 y.o. M) Treating RN: Donnamarie Poag Primary Care Nathian Stencil: Reeves Dam Other Clinician: Referring Romayne Ticas: Reeves Dam Treating Young Brim/Extender: Yaakov Guthrie in Treatment: 16 Encounter Discharge Information Items Discharge Condition: Stable Ambulatory Status: Ambulatory Discharge Destination: Home Transportation: Private Auto Accompanied By: self Schedule Follow-up Appointment: Yes Clinical Summary of Care: Electronic Signature(s) Signed: 09/09/2020 4:46:08 PM By: Donnamarie Poag Entered By: Donnamarie Poag on 09/09/2020 15:01:58 Shane Bean  ([id number]) -------------------------------------------------------------------------------- Lower Extremity Assessment Details Patient Name: Shane Bean Date of Service: 09/09/2020 2:15 PM Medical Record Number: [id number] Patient Account Number: [id number] Date of Birth/Sex: 04/23/1955 (65 y.o. M) Treating RN: Donnamarie Poag Primary Care Teah Votaw: Reeves Dam Other Clinician: Referring Daley Gosse: Reeves Dam Treating Jestin Burbach/Extender: Yaakov Guthrie in Treatment: 16 Edema Assessment Assessed: Shirlyn Goltz: Yes] Patrice Paradise: No] [Left: Edema] [Right: :] Calf Left: Right: Point of Measurement: 34 cm From Medial Instep 40 cm Ankle Left: Right: Point of Measurement: 10 cm From Medial Instep 22 cm Knee To Floor Left: Right: From Medial Instep 45 cm Vascular Assessment Pulses: Dorsalis Pedis Palpable: [Left:Yes] Electronic Signature(s) Signed: 09/09/2020 4:46:08 PM By: Donnamarie Poag Entered By: Donnamarie Poag on 09/09/2020 14:36:40 Shane Bean ([id number]) -------------------------------------------------------------------------------- Multi Wound Chart Details Patient Name: Shane Bean Date of Service: 09/09/2020 2:15 PM Medical Record Number: [id number] Patient Account Number: [id number] Date of Birth/Sex: [birth year] (65 y.o. M) Treating RN: Donnamarie Poag Primary Care Jiraiya Mcewan: Reeves Dam Other Clinician: Referring Tyrena Gohr: Reeves Dam Treating Mckensie Scotti/Extender: Yaakov Guthrie in Treatment: 16 Vital Signs Height(in): 70 Pulse(bpm): 61 Weight(lbs): 290 Blood Pressure(mmHg): 142/69 Body Mass Index(BMI): 42 Temperature(F): 98.6 Respiratory Rate(breaths/min): 16 Photos: [N/A:N/A] Wound Location: Left, Posterior Lower Leg N/A N/A Wounding Event: Gradually Appeared N/A N/A Primary Etiology: Diabetic Wound/Ulcer of the Lower N/A N/A Extremity Comorbid History: Hypertension, Type II Diabetes, N/A N/A Neuropathy Date Acquired: 04/08/2020 N/A N/A Weeks  of Treatment: 16 N/A N/A Wound Status: Open N/A N/A Measurements L x W x D (cm) 1.3x0.7x0.1 N/A N/A Area (cm) : 0.715 N/A N/A Volume (cm) : 0.071 N/A N/A % Reduction in Area: 93.50% N/A N/A % Reduction in Volume: 93.50% N/A N/A Classification: Grade 2 N/A N/A Exudate Amount: Large N/A N/A Exudate Type: Serosanguineous N/A N/A Exudate Color: red, brown N/A N/A Granulation Amount: Large (67-100%) N/A N/A Granulation Quality: Red, Pink N/A N/A Necrotic Amount: Small (1-33%) N/A N/A Exposed Structures: Fat Layer (Subcutaneous Tissue): N/A N/A Yes Fascia: No Tendon: No Muscle: No Joint: No Bone: No Epithelialization: Medium (34-66%) N/A N/A Procedures Performed: Compression Therapy N/A N/A Treatment Notes Wound #1 (Lower Leg) Wound Laterality: Left, Posterior Cleanser Soap and Water Discharge Instruction: Gently cleanse wound with antibacterial soap, rinse and pat dry prior to dressing wounds Peri-Wound Care Shane Bean, Shane V. (  [id number]) Topical Primary Dressing Hydrofera Blue Ready Transfer Foam, 2.5x2.5 (in/in) Discharge Instruction: Apply Hydrofera Blue Ready to wound bed as directed Secondary Dressing ABD Pad 5x9 (in/in) Discharge Instruction: Cover with ABD pad Secured With Compression Wrap Profore Lite LF 3 Multilayer Compression [place] Discharge Instruction: Apply 3 multi-layer wrap as prescribed. Compression Stockings Circaid Juxta Lite Compression Wrap Quantity: 1 Left Leg Compression Amount: 20-30 mmHg Discharge Instruction: Apply Circaid Juxta Lite Compression Wrap as directed Add-Ons Electronic Signature(s) Signed: 09/09/2020 3:20:37 PM By: Kalman Shan DO Entered By: Kalman Shan on 09/09/2020 15:16:47 Shane Bean ([id number]) -------------------------------------------------------------------------------- Multi-Disciplinary Care Plan Details Patient Name: Shane Bean Date of Service: 09/09/2020 2:15 PM Medical Record  Number: [id number] Patient Account Number: [id number] Date of Birth/Sex: 10-Oct-1955 (65 y.o. M) Treating RN: Donnamarie Poag Primary Care Juris Gosnell: Reeves Dam Other Clinician: Referring Walburga Hudman: Reeves Dam Treating Raeanna Soberanes/Extender: Yaakov Guthrie in Treatment: 16 Active Inactive Wound/Skin Impairment Nursing Diagnoses: Impaired tissue integrity Knowledge deficit related to smoking impact on wound healing Knowledge deficit related to ulceration/compromised skin integrity Goals: Patient/caregiver will verbalize understanding of skin care regimen Date Initiated: 05/18/2020 Date Inactivated: 07/08/2020 Target Resolution Date: 06/19/2020 Goal Status: Met Ulcer/skin breakdown will have a volume reduction of 30% by week 4 Date Initiated: 05/18/2020 Date Inactivated: 07/08/2020 Target Resolution Date: 06/15/2020 Goal Status: Met Ulcer/skin breakdown will have a volume reduction of 50% by week 8 Date Initiated: 05/18/2020 Date Inactivated: 07/15/2020 Target Resolution Date: 07/13/2020 Goal Status: Unmet Unmet Reason: con't tx Ulcer/skin breakdown will have a volume reduction of 80% by week 12 Date Initiated: 05/18/2020 Target Resolution Date: 08/10/2020 Goal Status: Active Interventions: Assess patient/caregiver ability to obtain necessary supplies Assess patient/caregiver ability to perform ulcer/skin care regimen upon admission and as needed Assess ulceration(s) every visit Notes: Electronic Signature(s) Signed: 09/09/2020 4:46:08 PM By: Donnamarie Poag Entered By: Donnamarie Poag on 09/09/2020 14:58:45 Shane Bean ([id number]) -------------------------------------------------------------------------------- Pain Assessment Details Patient Name: Shane Bean Date of Service: 09/09/2020 2:15 PM Medical Record Number: [id number] Patient Account Number: [id number] Date of Birth/Sex: 1955-04-18 (65 y.o. M) Treating RN: Donnamarie Poag Primary Care Cailan Antonucci: Reeves Dam Other  Clinician: Referring Cleto Claggett: Reeves Dam Treating Kiri Hinderliter/Extender: Yaakov Guthrie in Treatment: 16 Active Problems Location of Pain Severity and Description of Pain Patient Has Paino Yes Site Locations Pain Location: Pain in Ulcers Rate the pain. Current Pain Level: 7 Pain Management and Medication Current Pain Management: Electronic Signature(s) Signed: 09/09/2020 4:46:08 PM By: Donnamarie Poag Entered By: Donnamarie Poag on 09/09/2020 14:32:15 Shane Bean ([id number]) -------------------------------------------------------------------------------- Patient/Caregiver Education Details Patient Name: Shane Bean Date of Service: 09/09/2020 2:15 PM Medical Record Number: [id number] Patient Account Number: [id number] Date of Birth/Gender: October 12, 1955 (65 y.o. M) Treating RN: Donnamarie Poag Primary Care Physician: Reeves Dam Other Clinician: Referring Physician: Reeves Dam Treating Physician/Extender: Yaakov Guthrie in Treatment: 16 Education Assessment Education Provided To: Patient Education Topics Provided Wound/Skin Impairment: Electronic Signature(s) Signed: 09/09/2020 4:46:08 PM By: Donnamarie Poag Entered By: Donnamarie Poag on 09/09/2020 15:01:26 Shane Bean ([id number]) -------------------------------------------------------------------------------- Wound Assessment Details Patient Name: Shane Bean Date of Service: 09/09/2020 2:15 PM Medical Record Number: [id number] Patient Account Number: [id number] Date of Birth/Sex: Jun 02, 1955 (65 y.o. M) Treating RN: Donnamarie Poag Primary Care Ivar Domangue: Reeves Dam Other Clinician: Referring Zadin Lange: Reeves Dam Treating Tziporah Knoke/Extender: Yaakov Guthrie in Treatment: 16 Wound Status Wound Number: 1 Primary Etiology: Diabetic Wound/Ulcer of the Lower Extremity Wound Location: Left, Posterior Lower Leg Wound Status: Open Wounding Event: Gradually Appeared Comorbid History:  Hypertension, Type II Diabetes, Neuropathy Date Acquired: 04/08/2020 Weeks Of Treatment: 16 Clustered Wound: No Photos Wound Measurements Length: (cm) 1.3 Width: (cm) 0.7 Depth: (cm) 0.1 Area: (cm) 0.715 Volume: (cm) 0.071 % Reduction in Area: 93.5% % Reduction in Volume: 93.5% Epithelialization: Medium (34-66%) Tunneling: No Undermining: No Wound Description Classification: Grade 2 Exudate Amount: Large Exudate Type: Serosanguineous Exudate Color: red, brown Foul Odor After Cleansing: No Slough/Fibrino Yes Wound Bed Granulation Amount: Large (67-100%) Exposed Structure Granulation Quality: Red, Pink Fascia Exposed: No Necrotic Amount: Small (1-33%) Fat Layer (Subcutaneous Tissue) Exposed: Yes Necrotic Quality: Adherent Slough Tendon Exposed: No Muscle Exposed: No Joint Exposed: No Bone Exposed: No Treatment Notes Wound #1 (Lower Leg) Wound Laterality: Left, Posterior Cleanser Soap and Water Discharge Instruction: Gently cleanse wound with antibacterial soap, rinse and pat dry prior to dressing wounds Peri-Wound Care Shane Bean, Shane Bean ([id number]) Topical Primary Dressing Hydrofera Blue Ready Transfer Foam, 2.5x2.5 (in/in) Discharge Instruction: Apply Hydrofera Blue Ready to wound bed as directed Secondary Dressing ABD Pad 5x9 (in/in) Discharge Instruction: Cover with ABD pad Secured With Compression Wrap Profore Lite LF 3 Multilayer Compression Bandaging System Discharge Instruction: Apply 3 multi-layer wrap as prescribed. Compression Stockings Circaid Juxta Lite Compression Wrap Quantity: 1 Left Leg Compression Amount: 20-30 mmHg Discharge Instruction: Apply Circaid Juxta Lite Compression Wrap as directed Add-Ons Electronic Signature(s) Signed: 09/09/2020 4:46:08 PM By: Donnamarie Poag Entered By: Donnamarie Poag on 09/09/2020 14:35:45 Shane Bean ([id number]) -------------------------------------------------------------------------------- [place]  Details Patient Name: Shane Bean Date of Service: 09/09/2020 2:15 PM Medical Record Number: [id number] Patient Account Number: [id number] Date of Birth/Sex: December 15, 1955 (65 y.o. M) Treating RN: Donnamarie Poag Primary Care Keyanni Whittinghill: Reeves Dam Other Clinician: Referring Dorell Gatlin: Reeves Dam Treating Christain Mcraney/Extender: Yaakov Guthrie in Treatment: 16 Vital Signs Time Taken: 14:30 Temperature (F): 98.6 Height (in): 70 Pulse (bpm): 63 Weight (lbs): 290 Respiratory Rate (breaths/min): 16 Body Mass Index (BMI): 41.6 Blood Pressure (mmHg): 142/69 Reference Range: 80 - 120 mg / dl Electronic Signature(s) Signed: 09/09/2020 4:46:08 PM By: Donnamarie Poag Entered ByDonnamarie Poag on 09/09/2020 14:32:07

## 2020-09-09 NOTE — Progress Notes (Signed)
Shane Bean, Shane Bean ([id number]) Visit Report for 09/09/2020 Chief Complaint Document Details Patient Name: Shane Bean, Shane Bean Date of Service: 09/09/2020 2:15 PM Medical Record Number: [id number] Patient Account Number: [id number] Date of Birth/Sex: 11/04/1955 (65 y.o. M) Treating RN: Donnamarie Poag Primary Care Provider: Reeves Dam Other Clinician: Referring Provider: Reeves Dam Treating Provider/Extender: Yaakov Guthrie in Treatment: 16 Information Obtained from: Patient Chief Complaint Left LE Ulcer Electronic Signature(s) Signed: 09/09/2020 3:20:37 PM By: Kalman Shan DO Entered By: Kalman Shan on 09/09/2020 15:16:56 Shane Bean ([id number]) -------------------------------------------------------------------------------- HPI Details Patient Name: Shane Bean Date of Service: 09/09/2020 2:15 PM Medical Record Number: [id number] Patient Account Number: [id number] Date of Birth/Sex: [birth year] (65 y.o. M) Treating RN: Donnamarie Poag Primary Care Provider: Reeves Dam Other Clinician: Referring Provider: Reeves Dam Treating Provider/Extender: Yaakov Guthrie in Treatment: 16 History of Present Illness HPI Description: 05/18/2020 upon evaluation today patient appears for initial evaluation here in clinic concerning issues that he has been having with the wound on his left lateral leg. Fortunately there does not appear to be any signs of obvious an active infection at this time which is great news. With that being said the patient unfortunately is continued to have issues with pain although he tells me it is gotten a lot better. He was on Keflex as well as Bactrim DS which seems to have done a good job there. His most recent hemoglobin A1c was 9.8 that was on 04/14/2020. Subsequently I do feel like that the patient is making progress here. He does have a history of chronic venous insufficiency as well as hypertension. I do not see any need for  antibiotics at this point. 5/25; follow-up of the wound on the left posterior calf. This is completely necrotic on the surface. It does not look infected but it is painful. He is a diabetic but I think there is some suggestion here of chronic venous disease as well. We used Iodoflex under compression last week 6/1; again a completely necrotic surface on this with the wound. We have been using Iodoflex under compression he complains of gnawing pain. He has had wounds previously a lot of this looks like chronic venous disease. 6/8; about two thirds of the surface of this wound is still covered in a black necrotic eschar. I changed him from Iodoflex to [place] last week because of complaints of pain. He actually was seen by her neurologist this weeko Peripheral neuropathy as a cause of the pain and they changed him from Neurontin to Lyrica but he still has not had any relief. He says that most of the pain however is in his heel not in his wound per se. He is a diabetic 6/15. This is a patient with what looks to be a venous wound on the left lateral lower leg. He is also a diabetic. Currently being worked up for diabetic neuropathy. He complains of pain out of proportion to the size of the wound although to be fair the entire area here was eschared. We have been working to get a viable surface. We are using Sorbact 6/22; fairly painful wound on the left posterior calf. I am assuming this is been venous he is also a diabetic. His ABI in our clinic was noncompressible however he has easily palpable pulses on his feet. He complains of a lot of pain in the wound also of the left heel and the upper left calf. Some of this may be neuropathy. It is led me to discontinue Iodoflex reduce  his compression but he still complains of pain in fact he says he could not take a debridement today. When I first saw this it was completely necrotic surface we have got it down to something that looks a reasonably healthy I  have been wondering about biopsying this area however he is on Coumadin and with the pain I have put this off. The major question would be an inflammatory ulcer such as pyoderma. The patient is not aware of how this started. He does however have a wound history on both legs. 6/29; patient presents for 1 week follow-up. He has been using Hydrofera Blue under Kerlix/Coban. He has tolerated this well. He currently denies signs of infection. He also declines debridement today. He he states it is painful and does not want to have this done. 7/6; Patient presents for 1 week follow-up. He has been using Hydrofera Blue under Kerlix/Coban. He denies signs of infection. He declines debridement today. 7/15; patient presents for 1 week follow-up. He has been using Hydrofera Blue under Kerlix/Coban. He denies signs of infection today. He is agreeable to having debridement done today. 7/20; patient presents for 1 week follow-up. He is in good spirits today. He has been using Hydrofera Blue under Kerlix/Coban. He denies signs of infection today. 7/27; patient presents for 1 week follow-up. He is in good spirits today. He has no issues or complaints today. He denies signs of infection. 08/12/2020 upon evaluation today patient appears to be doing better in regard to his wound. He has been tolerating the dressing changes without complication. With that being said he did have arterial studies and does show that he has a TBI on the right of 0.90 and a TBI on the left of 0.76 which is excellent. There is no evidence of arterial compromise at this point. 8/17; patient presents for 1 week follow-up. He has no issues or complaints today. He denies signs of infection. 8/24; patient presents for 1 week follow-up. He has no issues or complaints today. He denies signs of infection. He continues to tolerate the compression wrap well. 8/31; patient presents for 1 week follow-up. He has no issues or complaints today. He denies  signs of infection. He is in good spirits today. 9/7; patient presents for 1 week follow-up. He is tolerated the compression wrap well. He has no issues or complaints today. He denies signs of infection. Electronic Signature(s) Signed: 09/09/2020 3:20:37 PM By: Kalman Shan DO Entered By: Kalman Shan on 09/09/2020 15:17:26 Shane Bean ([id number]) -------------------------------------------------------------------------------- Physical Exam Details Patient Name: Shane Bean Date of Service: 09/09/2020 2:15 PM Medical Record Number: [id number] Patient Account Number: [id number] Date of Birth/Sex: [birth year] (65 y.o. M) Treating RN: Donnamarie Poag Primary Care Provider: Reeves Dam Other Clinician: Referring Provider: Reeves Dam Treating Provider/Extender: Yaakov Guthrie in Treatment: 16 Constitutional . Cardiovascular . Psychiatric . Notes Left lower extremity: To the posterior aspect there is an open wound with granulation tissue present. No signs of infection. Good edema control. Electronic Signature(s) Signed: 09/09/2020 3:20:37 PM By: Kalman Shan DO Entered By: Kalman Shan on 09/09/2020 15:18:38 Shane Bean ([id number]) -------------------------------------------------------------------------------- Physician Orders Details Patient Name: Shane Bean Date of Service: 09/09/2020 2:15 PM Medical Record Number: [id number] Patient Account Number: [id number] Date of Birth/Sex: [birth year] (65 y.o. M) Treating RN: Donnamarie Poag Primary Care Provider: Reeves Dam Other Clinician: Referring Provider: Reeves Dam Treating Provider/Extender: Yaakov Guthrie in Treatment: 90 Verbal / Phone Orders: No Diagnosis Coding Follow-up Appointments o Return Appointment in  1 week. o Nurse Visit as needed Bathing/ Shower/ Hygiene Wound #1 Left,Posterior Lower Leg o May shower with wound dressing protected with water repellent  cover or cast protector. - cover dressing during shower-do not get it wet Edema Control - Lymphedema / Segmental Compressive Device / Other o Optional: One layer of unna paste to top of compression wrap (to act as an anchor). o Elevate, Exercise Daily and Avoid Standing for Long Periods of Time. o Elevate legs to the level of the heart and pump ankles as often as possible o Elevate leg(s) parallel to the floor when sitting. o DO YOUR BEST to sleep in the bed at night. DO NOT sleep in your recliner. Long hours of sitting in a recliner leads to swelling of the legs and/or potential wounds on your backside. Additional Orders / Instructions o Follow Nutritious Diet and Increase Protein Intake Wound Treatment Wound #1 - Lower Leg Wound Laterality: Left, Posterior Cleanser: Soap and Water 1 x Per Week/30 Days Discharge Instructions: Gently cleanse wound with antibacterial soap, rinse and pat dry prior to dressing wounds Primary Dressing: Hydrofera Blue Ready Transfer Foam, 2.5x2.5 (in/in) 1 x Per Week/30 Days Discharge Instructions: Apply Hydrofera Blue Ready to wound bed as directed Secondary Dressing: ABD Pad 5x9 (in/in) 1 x Per Week/30 Days Discharge Instructions: Cover with ABD pad Compression Wrap: Profore Lite LF 3 Multilayer Compression Bandaging System 1 x Per Week/30 Days Discharge Instructions: Apply 3 multi-layer wrap as prescribed. Compression Stockings: Circaid Juxta Lite Compression Wrap Left Leg Compression Amount: 20-30 mmHG Discharge Instructions: Apply Circaid Juxta Lite Compression Wrap as directed Electronic Signature(s) Signed: 09/09/2020 3:20:37 PM By: Kalman Shan DO Signed: 09/09/2020 4:46:08 PM By: Donnamarie Poag Entered By: Donnamarie Poag on 09/09/2020 15:00:55 Shane Bean ([id number]) -------------------------------------------------------------------------------- Problem List Details Patient Name: Shane Bean Date of Service: 09/09/2020 2:15  PM Medical Record Number: [id number] Patient Account Number: [id number] Date of Birth/Sex: 27-Jul-1955 (65 y.o. M) Treating RN: Donnamarie Poag Primary Care Provider: Reeves Dam Other Clinician: Referring Provider: Reeves Dam Treating Provider/Extender: Yaakov Guthrie in Treatment: 16 Active Problems ICD-10 Encounter Code Description Active Date MDM Diagnosis E11.622 Type 2 diabetes mellitus with other skin ulcer 05/18/2020 No Yes I87.2 Venous insufficiency (chronic) (peripheral) 05/18/2020 No Yes 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 layer 05/18/2020 No Yes exposed [place] (primary) hypertension 05/18/2020 No Yes I87.331 Chronic venous hypertension (idiopathic) with ulcer and inflammation of 08/26/2020 No Yes right lower extremity Inactive Problems Resolved Problems Electronic Signature(s) Signed: 09/09/2020 3:20:37 PM By: Kalman Shan DO Entered By: Kalman Shan on 09/09/2020 [place], [place]. ([id number]) -------------------------------------------------------------------------------- Progress Note Details Patient Name: Shane Bean Date of Service: 09/09/2020 2:15 PM Medical Record Number: [id number] Patient Account Number: [id number] Date of Birth/Sex: February 12, 1955 (65 y.o. M) Treating RN: Donnamarie Poag Primary Care Provider: Reeves Dam Other Clinician: Referring Provider: Reeves Dam Treating Provider/Extender: Yaakov Guthrie in Treatment: 16 Subjective Chief Complaint Information obtained from Patient Left LE Ulcer History of Present Illness (HPI) 05/18/2020 upon evaluation today patient appears for initial evaluation here in clinic concerning issues that he has been having with the wound on his left lateral leg. Fortunately there does not appear to be any signs of obvious an active infection at this time which is great news. With that being said the patient unfortunately is continued to have  issues with pain although he tells me it is gotten a lot better. He was on Keflex as well  as Bactrim DS which seems to have done a good job there. His most recent hemoglobin A1c was 9.8 that was on 04/14/2020. Subsequently I do feel like that the patient is making progress here. He does have a history of chronic venous insufficiency as well as hypertension. I do not see any need for antibiotics at this point. 5/25; follow-up of the wound on the left posterior calf. This is completely necrotic on the surface. It does not look infected but it is painful. He is a diabetic but I think there is some suggestion here of chronic venous disease as well. We used Iodoflex under compression last week 6/1; again a completely necrotic surface on this with the wound. We have been using Iodoflex under compression he complains of gnawing pain. He has had wounds previously a lot of this looks like chronic venous disease. 6/8; about two thirds of the surface of this wound is still covered in a black necrotic eschar. I changed him from Iodoflex to [place] last week because of complaints of pain. He actually was seen by her neurologist this weeko Peripheral neuropathy as a cause of the pain and they changed him from Neurontin to Lyrica but he still has not had any relief. He says that most of the pain however is in his heel not in his wound per se. He is a diabetic 6/15. This is a patient with what looks to be a venous wound on the left lateral lower leg. He is also a diabetic. Currently being worked up for diabetic neuropathy. He complains of pain out of proportion to the size of the wound although to be fair the entire area here was eschared. We have been working to get a viable surface. We are using Sorbact 6/22; fairly painful wound on the left posterior calf. I am assuming this is been venous he is also a diabetic. His ABI in our clinic was noncompressible however he has easily palpable pulses on his feet. He  complains of a lot of pain in the wound also of the left heel and the upper left calf. Some of this may be neuropathy. It is led me to discontinue Iodoflex reduce his compression but he still complains of pain in fact he says he could not take a debridement today. When I first saw this it was completely necrotic surface we have got it down to something that looks a reasonably healthy I have been wondering about biopsying this area however he is on Coumadin and with the pain I have put this off. The major question would be an inflammatory ulcer such as pyoderma. The patient is not aware of how this started. He does however have a wound history on both legs. 6/29; patient presents for 1 week follow-up. He has been using Hydrofera Blue under Kerlix/Coban. He has tolerated this well. He currently denies signs of infection. He also declines debridement today. He he states it is painful and does not want to have this done. 7/6; Patient presents for 1 week follow-up. He has been using Hydrofera Blue under Kerlix/Coban. He denies signs of infection. He declines debridement today. 7/15; patient presents for 1 week follow-up. He has been using Hydrofera Blue under Kerlix/Coban. He denies signs of infection today. He is agreeable to having debridement done today. 7/20; patient presents for 1 week follow-up. He is in good spirits today. He has been using Hydrofera Blue under Kerlix/Coban. He denies signs of infection today. 7/27; patient presents for 1 week follow-up. He is  in good spirits today. He has no issues or complaints today. He denies signs of infection. 08/12/2020 upon evaluation today patient appears to be doing better in regard to his wound. He has been tolerating the dressing changes without complication. With that being said he did have arterial studies and does show that he has a TBI on the right of 0.90 and a TBI on the left of 0.76 which is excellent. There is no evidence of arterial compromise  at this point. 8/17; patient presents for 1 week follow-up. He has no issues or complaints today. He denies signs of infection. 8/24; patient presents for 1 week follow-up. He has no issues or complaints today. He denies signs of infection. He continues to tolerate the compression wrap well. 8/31; patient presents for 1 week follow-up. He has no issues or complaints today. He denies signs of infection. He is in good spirits today. 9/7; patient presents for 1 week follow-up. He is tolerated the compression wrap well. He has no issues or complaints today. He denies signs of infection. Patient History Information obtained from Patient. Shane Bean, Shane Bean ([id number]) Social History Never smoker, Marital Status - Single, Alcohol Use - Moderate, Drug Use - No History, Caffeine Use - Never. Medical History Eyes Denies history of Cataracts, Glaucoma, Optic Neuritis Ear/Nose/Mouth/Throat Denies history of Chronic sinus problems/congestion Hematologic/Lymphatic Denies history of Anemia, Hemophilia, Human Immunodeficiency Virus, Lymphedema Respiratory Denies history of Aspiration, Asthma, Chronic Obstructive Pulmonary Disease (COPD), Pneumothorax, Sleep Apnea, Tuberculosis Cardiovascular Patient has history of Hypertension Denies history of Angina, Arrhythmia, Congestive Heart Failure, Coronary Artery Disease, Deep Vein Thrombosis, Hypotension, Myocardial Infarction, Peripheral Arterial Disease, Peripheral Venous Disease, Phlebitis, Vasculitis Gastrointestinal Denies history of Cirrhosis , Colitis, Crohn s, Hepatitis A, Hepatitis B Endocrine Patient has history of Type II Diabetes Denies history of Type I Diabetes Genitourinary Denies history of End Stage Renal Disease Immunological Denies history of Lupus Erythematosus, Raynaud s, Scleroderma Integumentary (Skin) Denies history of History of Burn, History of pressure wounds Musculoskeletal Denies history of Gout, Rheumatoid Arthritis,  Osteoarthritis, Osteomyelitis Neurologic Patient has history of Neuropathy Denies history of Dementia, Quadriplegia, Paraplegia, Seizure Disorder Oncologic Denies history of Received Chemotherapy, Received Radiation Psychiatric Denies history of Anorexia/bulimia, Confinement Anxiety Objective Constitutional Vitals Time Taken: 2:30 PM, Height: 70 in, Weight: 290 lbs, BMI: 41.6, Temperature: 98.6 F, Pulse: 63 bpm, Respiratory Rate: 16 breaths/min, Blood Pressure: 142/69 mmHg. General Notes: Left lower extremity: To the posterior aspect there is an open wound with granulation tissue present. No signs of infection. Good edema control. Integumentary (Hair, Skin) Wound #1 status is Open. Original cause of wound was Gradually Appeared. The date acquired was: 04/08/2020. The wound has been in treatment 16 weeks. The wound is located on the Left,Posterior Lower Leg. The wound measures 1.3cm length x 0.7cm width x 0.1cm depth; 0.715cm^2 area and 0.071cm^3 volume. There is Fat Layer (Subcutaneous Tissue) exposed. There is no tunneling or undermining noted. There is a large amount of serosanguineous drainage noted. There is large (67-100%) red, pink granulation within the wound bed. There is a small (1-33%) amount of necrotic tissue within the wound bed including Adherent Slough. Assessment Active Problems ICD-10 Type 2 diabetes mellitus with other skin ulcer Venous insufficiency (chronic) (peripheral) Non-pressure chronic ulcer of other part of left lower leg with fat layer exposed Essential (primary) hypertension Shane Bean, Shane V. ([id number]) Chronic venous hypertension (idiopathic) with ulcer and inflammation of right lower extremity Patient's wound has shown improvement in size and appearance since last clinic visit. The  area looks clean and well-healing. I recommended continuing Hydrofera Blue under compression wrap. No signs of infection. Follow-up in 1 week Procedures Wound  #1 Pre-procedure diagnosis of Wound #1 is a Diabetic Wound/Ulcer of the Lower Extremity located on the Left,Posterior Lower Leg . There was a Three Layer Compression Therapy Procedure by Donnamarie Poag, RN. Post procedure Diagnosis Wound #1: Same as Pre-Procedure Plan Follow-up Appointments: Return Appointment in 1 week. Nurse Visit as needed Bathing/ Shower/ Hygiene: Wound #1 Left,Posterior Lower Leg: May shower with wound dressing protected with water repellent cover or cast protector. - cover dressing during shower-do not get it wet Edema Control - Lymphedema / Segmental Compressive Device / Other: Optional: One layer of unna paste to top of compression wrap (to act as an anchor). Elevate, Exercise Daily and Avoid Standing for Long Periods of Time. Elevate legs to the level of the heart and pump ankles as often as possible Elevate leg(s) parallel to the floor when sitting. DO YOUR BEST to sleep in the bed at night. DO NOT sleep in your recliner. Long hours of sitting in a recliner leads to swelling of the legs and/or potential wounds on your backside. Additional Orders / Instructions: Follow Nutritious Diet and Increase Protein Intake WOUND #1: - Lower Leg Wound Laterality: Left, Posterior Cleanser: Soap and Water 1 x Per Week/30 Days Discharge Instructions: Gently cleanse wound with antibacterial soap, rinse and pat dry prior to dressing wounds Primary Dressing: Hydrofera Blue Ready Transfer Foam, 2.5x2.5 (in/in) 1 x Per Week/30 Days Discharge Instructions: Apply Hydrofera Blue Ready to wound bed as directed Secondary Dressing: ABD Pad 5x9 (in/in) 1 x Per Week/30 Days Discharge Instructions: Cover with ABD pad Compression Wrap: Profore Lite LF 3 Multilayer Compression Bandaging System 1 x Per Week/30 Days Discharge Instructions: Apply 3 multi-layer wrap as prescribed. Compression Stockings: Circaid Juxta Lite Compression Wrap Compression Amount: 20-30 mmHg (left) Discharge  Instructions: Apply Circaid Juxta Lite Compression Wrap as directed 1. Hydrofera Blue under compression wrap 2. Follow-up in 1 week Electronic Signature(s) Signed: 09/09/2020 3:20:37 PM By: Kalman Shan DO Entered By: Kalman Shan on 09/09/2020 15:19:34 Shane Bean ([id number]) -------------------------------------------------------------------------------- ROS/PFSH Details Patient Name: Shane Bean Date of Service: 09/09/2020 2:15 PM Medical Record Number: [id number] Patient Account Number: [id number] Date of Birth/Sex: [birth year] (65 y.o. M) Treating RN: Donnamarie Poag Primary Care Provider: Reeves Dam Other Clinician: Referring Provider: Reeves Dam Treating Provider/Extender: Yaakov Guthrie in Treatment: 16 Information Obtained From Patient Eyes Medical History: Negative for: Cataracts; Glaucoma; Optic Neuritis Ear/Nose/Mouth/Throat Medical History: Negative for: Chronic sinus problems/congestion Hematologic/Lymphatic Medical History: Negative for: Anemia; Hemophilia; Human Immunodeficiency Virus; Lymphedema Respiratory Medical History: Negative for: Aspiration; Asthma; Chronic Obstructive Pulmonary Disease (COPD); Pneumothorax; Sleep Apnea; Tuberculosis Cardiovascular Medical History: Positive for: Hypertension Negative for: Angina; Arrhythmia; Congestive Heart Failure; Coronary Artery Disease; Deep Vein Thrombosis; Hypotension; Myocardial Infarction; Peripheral Arterial Disease; Peripheral Venous Disease; Phlebitis; Vasculitis Gastrointestinal Medical History: Negative for: Cirrhosis ; Colitis; Crohnos; Hepatitis A; Hepatitis B Endocrine Medical History: Positive for: Type II Diabetes Negative for: Type I Diabetes Time with diabetes: 3 Treated with: Oral agents Blood sugar tested every day: No Genitourinary Medical History: Negative for: End Stage Renal Disease Immunological Medical History: Negative for: Lupus Erythematosus;  Raynaudos; Scleroderma Integumentary (Skin) Medical History: Negative for: History of Burn; History of pressure wounds Shane Bean, Shane V. ([id number]) Musculoskeletal Medical History: Negative for: Gout; Rheumatoid Arthritis; Osteoarthritis; Osteomyelitis Neurologic Medical History: Positive for: Neuropathy Negative for: Dementia; Quadriplegia; Paraplegia; Seizure Disorder Oncologic Medical History: Negative  for: Received Chemotherapy; Received Radiation Psychiatric Medical History: Negative for: Anorexia/bulimia; Confinement Anxiety Immunizations Pneumococcal Vaccine: Received Pneumococcal Vaccination: Yes Received Pneumococcal Vaccination On or After 60th Birthday: No Implantable Devices None Family and Social History Never smoker; Marital Status - Single; Alcohol Use: Moderate; Drug Use: No History; Caffeine Use: Never; Financial Concerns: No; Food, Clothing or Shelter Needs: No; Support System Lacking: No; Transportation Concerns: No Electronic Signature(s) Signed: 09/09/2020 3:20:37 PM By: Kalman Shan DO Signed: 09/09/2020 4:46:08 PM By: Donnamarie Poag Entered By: Kalman Shan on 09/09/2020 15:17:33 Shane Bean ([id number]) -------------------------------------------------------------------------------- [place] Details Patient Name: Shane Bean Date of Service: 09/09/2020 Medical Record Number: [id number] Patient Account Number: [id number] Date of Birth/Sex: Dec 27, 1955 (65 y.o. M) Treating RN: Donnamarie Poag Primary Care Provider: Reeves Dam Other Clinician: Referring Provider: Reeves Dam Treating Provider/Extender: Yaakov Guthrie in Treatment: 16 Diagnosis Coding ICD-10 Codes Code Description E11.622 Type 2 diabetes mellitus with other skin ulcer I87.2 Venous insufficiency (chronic) (peripheral) 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 layer exposed I10 Essential (primary) hypertension I87.331 Chronic  venous hypertension (idiopathic) with ulcer and inflammation of right lower extremity Facility Procedures CPT4 Code: [id number] Description: (Facility Use Only) [phone] - [facility] LT LEG Modifier: Quantity: 1 Physician Procedures CPT4 Code Description: [id number] - WC PHYS LEVEL 3 - EST PT Modifier: Quantity: 1 CPT4 Code Description: ICD-10 Diagnosis Description I87.331 Chronic venous hypertension (idiopathic) with ulcer and inflammation of I87.2 Venous insufficiency (chronic) (peripheral) L97.822 Non-pressure chronic ulcer of other part of left lower leg with  fat lay E11.622 Type 2 diabetes mellitus with other skin ulcer Modifier: right lower extremit er exposed Quantity: y Engineer, maintenance) Signed: 09/09/2020 3:20:37 PM By: Kalman Shan DO Entered By: Kalman Shan on 09/09/2020 15:19:58

## 2020-09-16 ENCOUNTER — Other Ambulatory Visit: Payer: Self-pay

## 2020-09-16 ENCOUNTER — Encounter (HOSPITAL_BASED_OUTPATIENT_CLINIC_OR_DEPARTMENT_OTHER): Payer: Medicare (Managed Care) | Admitting: Internal Medicine

## 2020-09-16 DIAGNOSIS — E11622 Type 2 diabetes mellitus with other skin ulcer: Secondary | ICD-10-CM

## 2020-09-16 DIAGNOSIS — L97822 Non-pressure chronic ulcer of other part of left lower leg with fat layer exposed: Secondary | ICD-10-CM | POA: Diagnosis not present

## 2020-09-16 DIAGNOSIS — I872 Venous insufficiency (chronic) (peripheral): Secondary | ICD-10-CM

## 2020-09-16 DIAGNOSIS — I10 Essential (primary) hypertension: Secondary | ICD-10-CM

## 2020-09-16 NOTE — Progress Notes (Signed)
Shane Bean, Shane Bean ([id number]) Visit Report for 09/16/2020 Arrival Information Details Patient Name: Shane Bean, Shane Bean Date of Service: 09/16/2020 1:15 PM Medical Record Number: [id number] Patient Account Number: [id number] Date of Birth/Sex: May 29, 1955 (64 y.o. M) Treating RN: Carlene Coria Primary Care Nicoli Nardozzi: Reeves Dam Other Clinician: Referring Sriman Tally: Reeves Dam Treating Shatisha Falter/Extender: Yaakov Guthrie in Treatment: 66 Visit Information History Since Last Visit All ordered tests and consults were completed: No Patient Arrived: Ambulatory Added or deleted any medications: No Arrival Time: 13:43 Any new allergies or adverse reactions: No Accompanied By: self Had a fall or experienced change in No Transfer Assistance: None activities of daily living that may affect Patient Identification Verified: Yes risk of falls: Secondary Verification Process Completed: Yes Signs or symptoms of abuse/neglect since last visito No Patient Requires Transmission-Based No Hospitalized since last visit: No Precautions: Implantable device outside of the clinic excluding No Patient Has Alerts: Yes cellular tissue based products placed in the center Patient Alerts: Patient on Blood since last visit: Thinner Has Dressing in Place as Prescribed: Yes DIABETIC Pain Present Now: Yes 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 M) Treating RN: Carlene Coria Primary Care Woodrow Drab: Reeves Dam Other Clinician: Referring Marisol Giambra: Reeves Dam Treating Shameka Aggarwal/Extender: Yaakov Guthrie in Treatment: 17 Clinic Level of Care Assessment Items TOOL 1 Quantity Score '[]'$  - Use when EandM and Procedure is performed on INITIAL visit 0 ASSESSMENTS - Nursing Assessment / Reassessment '[]'$  - General Physical Exam (combine w/ comprehensive assessment (listed just below) when performed on new 0 pt. evals) '[]'$  - 0 Comprehensive Assessment (HX, ROS, Risk Assessments, Wounds Hx, etc.) ASSESSMENTS - Wound and Skin Assessment / Reassessment '[]'$  - Dermatologic / Skin Assessment (not related to wound area) 0 ASSESSMENTS - Ostomy and/or Continence Assessment and Care '[]'$  - Incontinence Assessment and Management 0 '[]'$  - 0 Ostomy Care Assessment and Management (repouching, etc.) PROCESS - Coordination of Care '[]'$  - Simple Patient / Family Education for ongoing care 0 '[]'$  - 0 Complex (extensive) Patient / Family Education for ongoing care '[]'$  - 0 Staff obtains Programmer, systems, Records, Test Results / Process Orders '[]'$  - 0 Staff telephones HHA, Nursing Homes / Clarify orders / etc '[]'$  - 0 Routine Transfer to another Facility (non-emergent condition) '[]'$  - 0 Routine Hospital Admission (non-emergent condition) '[]'$  - 0 New Admissions / Biomedical engineer / Ordering NPWT, Apligraf, etc. '[]'$  - 0 Emergency Hospital Admission (emergent condition) PROCESS - Special Needs '[]'$  - Pediatric / Minor Patient Management 0 '[]'$  - 0 Isolation Patient Management '[]'$  - 0 Hearing / Language / Visual special needs '[]'$  - 0 Assessment of Community assistance (transportation, D/C planning, etc.) '[]'$  - 0 Additional assistance / Altered mentation '[]'$  - 0 Support Surface(s) Assessment (bed, cushion, seat, etc.) INTERVENTIONS - Miscellaneous '[]'$  - External ear exam 0 '[]'$  - 0 Patient Transfer (multiple staff / Civil Service fast streamer / Similar devices) '[]'$  - 0 Simple Staple / Suture removal (25 or less) '[]'$  - 0 Complex Staple / Suture removal (26 or more) '[]'$  -  0 Hypo/Hyperglycemic Management (do not check if billed separately) '[]'$  - 0 Ankle / Brachial Index (ABI) - do not check if billed separately Has the patient been  seen at the hospital within the last three years: Yes Total Score: 0 Level Of Care: ____ Shane Bean ([id number]) Electronic Signature(s) Signed: 09/16/2020 3:36:37 PM By: Carlene Coria RN Entered By: Carlene Coria on 09/16/2020 14:17:49 Shane Bean ([id number]) -------------------------------------------------------------------------------- Compression Therapy Details Patient Name: Shane Bean Date of Service: 09/16/2020 1:15 PM Medical Record Number: [id number] Patient Account Number: [id number] Date of Birth/Sex: 22-Nov-1955 (64 y.o. M) Treating RN: Carlene Coria Primary Care Chaya Dehaan: Reeves Dam Other Clinician: Referring Daelin Haste: Reeves Dam Treating Donae Kueker/Extender: Yaakov Guthrie in Treatment: 17 Compression Therapy Performed for Wound Assessment: Wound #1 Left,Posterior Lower Leg Performed By: Clinician Carlene Coria, RN Compression Type: Three Layer Post Procedure Diagnosis Same as Pre-procedure Electronic Signature(s) Signed: 09/16/2020 3:36:37 PM By: Carlene Coria RN Entered By: Carlene Coria on 09/16/2020 14:13:43 Shane Bean ([id number]) -------------------------------------------------------------------------------- Encounter Discharge Information Details Patient Name: Shane Bean Date of Service: 09/16/2020 1:15 PM Medical Record Number: [id number] Patient Account Number: [id number] Date of Birth/Sex: [birth year] (64 y.o. M) Treating RN: Carlene Coria Primary Care Maeson Purohit: Reeves Dam Other Clinician: Referring Jermya Dowding: Reeves Dam Treating Mintie Witherington/Extender: Yaakov Guthrie in Treatment: 17 Encounter Discharge Information Items Post Procedure Vitals Discharge Condition: Stable Temperature (F): 98.2 Ambulatory Status: Ambulatory Pulse (bpm):  68 Discharge Destination: Home Respiratory Rate (breaths/min): 18 Transportation: Private Auto Blood Pressure (mmHg): 140/70 Accompanied By: self Schedule Follow-up Appointment: No Clinical Summary of Care: Electronic Signature(s) Signed: 09/16/2020 3:36:37 PM By: Carlene Coria RN Entered By: Carlene Coria on 09/16/2020 14:19:05 Shane Bean ([id number]) -------------------------------------------------------------------------------- Lower Extremity Assessment Details Patient Name: Shane Bean Date of Service: 09/16/2020 1:15 PM Medical Record Number: [id number] Patient Account Number: [id number] Date of Birth/Sex: October 27, 1955 (64 y.o. M) Treating RN: Carlene Coria Primary Care Dhana Totton: Reeves Dam Other Clinician: Referring Yenifer Saccente: Reeves Dam Treating Kemiah Booz/Extender: Yaakov Guthrie in Treatment: 17 Edema Assessment Assessed: [Left: No] Patrice Paradise: No] Edema: [Left: N] [Right: o] Calf Left: Right: Point of Measurement: 34 cm From Medial Instep 40 cm Ankle Left: Right: Point of Measurement: 10 cm From Medial Instep 25 cm Vascular Assessment Pulses: Dorsalis Pedis Palpable: [Left:Yes] Electronic Signature(s) Signed: 09/16/2020 3:36:37 PM By: Carlene Coria RN Entered By: Carlene Coria on 09/16/2020 13:51:30 Shane Bean ([id number]) -------------------------------------------------------------------------------- Multi Wound Chart Details Patient Name: Shane Bean Date of Service: 09/16/2020 1:15 PM Medical Record Number: [id number] Patient Account Number: [id number] Date of Birth/Sex: 06/07/1955 (64 y.o. M) Treating RN: Carlene Coria Primary Care Layli Capshaw: Reeves Dam Other Clinician: Referring Larwence Tu: Reeves Dam Treating Shonte Soderlund/Extender: Yaakov Guthrie in Treatment: 17 Vital Signs Height(in): 70 Pulse(bpm): 74 Weight(lbs): 290 Blood Pressure(mmHg): 140/70 Body Mass Index(BMI): 42 Temperature(F): 98.2 Respiratory  Rate(breaths/min): 18 Photos: [N/A:N/A] Wound Location: Left, Posterior Lower Leg N/A N/A Wounding Event: Gradually Appeared N/A N/A Primary Etiology: Diabetic Wound/Ulcer of the Lower N/A N/A Extremity Comorbid History: Hypertension, Type II Diabetes, N/A N/A Neuropathy Date Acquired: 04/08/2020 N/A N/A Weeks of Treatment: 17 N/A N/A Wound Status: Open N/A N/A Measurements L x W x D (cm) 1.7x0.7x0.1 N/A N/A Area (cm) : 0.935 N/A N/A Volume (cm) : 0.093 N/A N/A % Reduction in Area: 91.50% N/A N/A % Reduction in Volume: 91.50% N/A N/A Classification: Grade 2 N/A N/A Exudate Amount: Large N/A N/A Exudate Type: Serosanguineous N/A N/A Exudate Color: red, brown N/A N/A Granulation Amount: Large (67-100%) N/A N/A Granulation Quality: Red, Pink N/A N/A Necrotic Amount: Small (1-33%) N/A N/A Exposed Structures: Fat Layer (Subcutaneous Tissue): N/A N/A Yes Fascia: No Tendon: No Muscle: No Joint:  No Bone: No Epithelialization: Medium (34-66%) N/A N/A Debridement: Debridement - Excisional N/A N/A Pre-procedure Verification/Time 14:13 N/A N/A Out Taken: Tissue Debrided: Subcutaneous, Slough N/A N/A Level: Skin/Subcutaneous Tissue N/A N/A Debridement Area (sq cm): 1.19 N/A N/A Instrument: Curette N/A N/A Bleeding: Minimum N/A N/A Hemostasis Achieved: Pressure N/A N/A Procedural Pain: 0 N/A N/A Post Procedural Pain: 0 N/A N/A Debridement Treatment Procedure was tolerated well N/A N/A ResponseTERRIC, Shane Bean ([id number]) Post Debridement [phone] N/A N/A Measurements L x W x D (cm) Post Debridement Volume: 94402.34 N/A N/A (cm) Procedures Performed: Compression Therapy N/A N/A Debridement Treatment Notes Wound #1 (Lower Leg) Wound Laterality: Left, Posterior Cleanser Soap and Water Discharge Instruction: Gently cleanse wound with antibacterial soap, rinse and pat dry prior to dressing wounds Peri-Wound Care Topical Primary Dressing Hydrofera Blue Ready Transfer  Foam, 2.5x2.5 (in/in) Discharge Instruction: Apply Hydrofera Blue Ready to wound bed as directed Secondary Dressing ABD Pad 5x9 (in/in) Discharge Instruction: Cover with ABD pad Secured With Compression Wrap Profore Lite LF 3 Multilayer Compression Bandaging System Discharge Instruction: Apply 3 multi-layer wrap as prescribed. Compression Stockings Circaid Juxta Lite Compression Wrap Quantity: 1 Left Leg Compression Amount: 20-30 mmHg Discharge Instruction: Apply Circaid Juxta Lite Compression Wrap as directed Add-Ons Electronic Signature(s) Signed: 09/16/2020 2:22:57 PM By: Kalman Shan DO Entered By: Kalman Shan on 09/16/2020 14:19:43 Shane Bean ([id number]) -------------------------------------------------------------------------------- Multi-Disciplinary Care Plan Details Patient Name: Shane Bean Date of Service: 09/16/2020 1:15 PM Medical Record Number: [id number] Patient Account Number: [id number] Date of Birth/Sex: 08-05-1955 (64 y.o. M) Treating RN: Carlene Coria Primary Care Jahna Liebert: Reeves Dam Other Clinician: Referring Aigner Horseman: Reeves Dam Treating Giannie Soliday/Extender: Yaakov Guthrie in Treatment: 17 Active Inactive Electronic Signature(s) Signed: 09/16/2020 3:36:37 PM By: Carlene Coria RN Entered By: Carlene Coria on 09/16/2020 14:13:16 Shane Bean ([id number]) -------------------------------------------------------------------------------- Pain Assessment Details Patient Name: Shane Bean Date of Service: 09/16/2020 1:15 PM Medical Record Number: [id number] Patient Account Number: [id number] Date of Birth/Sex: [birth year] (64 y.o. M) Treating RN: Carlene Coria Primary Care Brayon Bielefeld: Reeves Dam Other Clinician: Referring Chamia Schmutz: Reeves Dam Treating Beretta Ginsberg/Extender: Yaakov Guthrie in Treatment: 17 Active Problems Location of Pain Severity and Description of Pain Patient Has Paino Yes Site Locations  With Dressing Change: Yes Duration of the Pain. Constant / Intermittento Intermittent How Long Does it Lasto Hours: Minutes: 15 Rate the pain. Current Pain Level: 6 Worst Pain Level: 8 Least Pain Level: 0 Tolerable Pain Level: 5 Character of Pain Describe the Pain: Aching Pain Management and Medication Current Pain Management: Medication: Yes Cold Application: No Rest: Yes Massage: No Activity: No T.E.N.S.: No Heat Application: No Leg drop or elevation: No Is the Current Pain Management Adequate: Inadequate How does your wound impact your activities of daily livingo Sleep: No Bathing: No Appetite: No Relationship With Others: No Bladder Continence: No Emotions: No Bowel Continence: No Work: No Toileting: No Drive: No Dressing: No Hobbies: No Electronic Signature(s) Signed: 09/16/2020 3:36:37 PM By: Carlene Coria RN Entered By: Carlene Coria on 09/16/2020 13:46:40 Shane Bean ([id number]) -------------------------------------------------------------------------------- Patient/Caregiver Education Details Patient Name: Shane Bean Date of Service: 09/16/2020 1:15 PM Medical Record Number: [id number] Patient Account Number: [id number] Date of Birth/Gender: [birth year] (64 y.o. M) Treating RN: Carlene Coria Primary Care Physician: Reeves Dam Other Clinician: Referring Physician: Reeves Dam Treating Physician/Extender: Yaakov Guthrie in Treatment: 56 Education Assessment Education Provided To: Patient Education Topics Provided Wound/Skin Impairment: Methods: Explain/Verbal Responses: State content correctly [employer]) Signed: 09/16/2020 3:36:37 PM By: Dolores Lory,  Morey Hummingbird RN Entered By: Carlene Coria on 09/16/2020 14:18:22 Shane Bean ([id number]) -------------------------------------------------------------------------------- Wound Assessment Details Patient Name: Shane Bean, Shane Bean Date of Service: 09/16/2020 1:15  PM Medical Record Number: [id number] Patient Account Number: [id number] Date of Birth/Sex: 17-Jan-1955 (64 y.o. M) Treating RN: Carlene Coria Primary Care Alarik Radu: Reeves Dam Other Clinician: Referring Correll Denbow: Reeves Dam Treating Doreather Hoxworth/Extender: Yaakov Guthrie in Treatment: 17 Wound Status Wound Number: 1 Primary Etiology: Diabetic Wound/Ulcer of the Lower Extremity Wound Location: Left, Posterior Lower Leg Wound Status: Open Wounding Event: Gradually Appeared Comorbid History: Hypertension, Type II Diabetes, Neuropathy Date Acquired: 04/08/2020 Weeks Of Treatment: 17 Clustered Wound: No Photos Wound Measurements Length: (cm) 1.7 Width: (cm) 0.7 Depth: (cm) 0.1 Area: (cm) 0.935 Volume: (cm) 0.093 % Reduction in Area: 91.5% % Reduction in Volume: 91.5% Epithelialization: Medium (34-66%) Tunneling: No Undermining: No Wound Description Classification: Grade 2 Exudate Amount: Large Exudate Type: Serosanguineous Exudate Color: red, brown Foul Odor After Cleansing: No Slough/Fibrino Yes Wound Bed Granulation Amount: Large (67-100%) Exposed Structure Granulation Quality: Red, Pink Fascia Exposed: No Necrotic Amount: Small (1-33%) Fat Layer (Subcutaneous Tissue) Exposed: Yes Necrotic Quality: Adherent Slough Tendon Exposed: No Muscle Exposed: No Joint Exposed: No Bone Exposed: No Treatment Notes Wound #1 (Lower Leg) Wound Laterality: Left, Posterior Cleanser Soap and Water Discharge Instruction: Gently cleanse wound with antibacterial soap, rinse and pat dry prior to dressing wounds Peri-Wound Care Shane Bean, Shane Bean ([id number]) Topical Primary Dressing Hydrofera Blue Ready Transfer Foam, 2.5x2.5 (in/in) Discharge Instruction: Apply Hydrofera Blue Ready to wound bed as directed Secondary Dressing ABD Pad 5x9 (in/in) Discharge Instruction: Cover with ABD pad Secured With Compression Wrap Profore Lite LF 3 Multilayer Compression Bandaging  System Discharge Instruction: Apply 3 multi-layer wrap as prescribed. Compression Stockings Circaid Juxta Lite Compression Wrap Quantity: 1 Left Leg Compression Amount: 20-30 mmHg Discharge Instruction: Apply Circaid Juxta Lite Compression Wrap as directed Add-Ons Electronic Signature(s) Signed: 09/16/2020 3:36:37 PM By: Carlene Coria RN Entered By: Carlene Coria on 09/16/2020 13:50:52 Shane Bean ([id number]) -------------------------------------------------------------------------------- Vitals Details Patient Name: Shane Bean Date of Service: 09/16/2020 1:15 PM Medical Record Number: [id number] Patient Account Number: [id number] Date of Birth/Sex: [birth year] (64 y.o. M) Treating RN: Carlene Coria Primary Care Rayhaan Huster: Reeves Dam Other Clinician: Referring Bruin Bolger: Reeves Dam Treating Rourke Mcquitty/Extender: Yaakov Guthrie in Treatment: 17 Vital Signs Time Taken: 13:45 Temperature (F): 98.2 Height (in): 70 Pulse (bpm): 68 Weight (lbs): 290 Respiratory Rate (breaths/min): 18 Body Mass Index (BMI): 41.6 Blood Pressure (mmHg): 140/70 Reference Range: 80 - 120 mg / dl Electronic Signature(s) Signed: 09/16/2020 3:36:37 PM By: Carlene Coria RN Entered By: Carlene Coria on 09/16/2020 13:46:05

## 2020-09-16 NOTE — Progress Notes (Signed)
BASTIEN, COODY ([id number]) Visit Report for 09/16/2020 Chief Complaint Document Details Patient Name: Shane Bean, Shane Bean Date of Service: 09/16/2020 1:15 PM Medical Record Number: [id number] Patient Account Number: [id number] Date of Birth/Sex: 02/27/1955 (64 y.o. M) Treating RN: Carlene Coria Primary Care Provider: Reeves Dam Other Clinician: Referring Provider: Reeves Dam Treating Provider/Extender: Yaakov Guthrie in Treatment: 17 Information Obtained from: Patient Chief Complaint Left LE Ulcer Electronic Signature(s) Signed: 09/16/2020 2:22:57 PM By: Kalman Shan DO Entered By: Kalman Shan on 09/16/2020 [place], [place]. ([id number]) -------------------------------------------------------------------------------- Debridement Details Patient Name: Shane Bean Date of Service: 09/16/2020 1:15 PM Medical Record Number: [id number] Patient Account Number: [id number] Date of Birth/Sex: [birth year] (64 y.o. M) Treating RN: Carlene Coria Primary Care Provider: Reeves Dam Other Clinician: Referring Provider: Reeves Dam Treating Provider/Extender: Yaakov Guthrie in Treatment: 17 Debridement Performed for Wound #1 Left,Posterior Lower Leg Assessment: Performed By: Physician Kalman Shan, MD Debridement Type: Debridement Severity of Tissue Pre Debridement: Fat layer exposed Level of Consciousness (Pre- Awake and Alert procedure): Pre-procedure Verification/Time Out Yes - 14:13 Taken: Start Time: 14:13 Total Area Debrided (L x W): 1.7 (cm) x 0.7 (cm) = 1.19 (cm) Tissue and other material Viable, Non-Viable, Slough, Subcutaneous, Skin: Dermis , Skin: Epidermis, Slough debrided: Level: Skin/Subcutaneous Tissue Debridement Description: Excisional Instrument: Curette Bleeding: Minimum Hemostasis Achieved: Pressure End Time: 14:16 Procedural Pain: 0 Post Procedural Pain: 0 Response to Treatment: Procedure was tolerated  well Level of Consciousness (Post- Awake and Alert procedure): Post Debridement Measurements of Total Wound Length: (cm) 98.2 Width: (cm) 68 Depth: (cm) 18 Volume: (cm) 94402.34 Character of Wound/Ulcer Post Debridement: Improved Severity of Tissue Post Debridement: Fat layer exposed Post Procedure Diagnosis Same as Pre-procedure Electronic Signature(s) Signed: 09/16/2020 2:22:57 PM By: Kalman Shan DO Signed: 09/16/2020 3:36:37 PM By: Carlene Coria RN Entered By: Carlene Coria on 09/16/2020 14:17:20 Shane Bean ([id number]) -------------------------------------------------------------------------------- HPI Details Patient Name: Shane Bean Date of Service: 09/16/2020 1:15 PM Medical Record Number: [id number] Patient Account Number: [id number] Date of Birth/Sex: Jul 17, 1955 (64 y.o. M) Treating RN: Carlene Coria Primary Care Provider: Reeves Dam Other Clinician: Referring Provider: Reeves Dam Treating Provider/Extender: Yaakov Guthrie in Treatment: 17 History of Present Illness HPI Description: 05/18/2020 upon evaluation today patient appears for initial evaluation here in clinic concerning issues that he has been having with the wound on his left lateral leg. Fortunately there does not appear to be any signs of obvious an active infection at this time which is great news. With that being said the patient unfortunately is continued to have issues with pain although he tells me it is gotten a lot better. He was on Keflex as well as Bactrim DS which seems to have done a good job there. His most recent hemoglobin A1c was 9.8 that was on 04/14/2020. Subsequently I do feel like that the patient is making progress here. He does have a history of chronic venous insufficiency as well as hypertension. I do not see any need for antibiotics at this point. 5/25; follow-up of the wound on the left posterior calf. This is completely necrotic on the surface. It does not  look infected but it is painful. He is a diabetic but I think there is some suggestion here of chronic venous disease as well. We used Iodoflex under compression last week 6/1; again a completely necrotic surface on this with the wound. We have been using Iodoflex under compression he complains of gnawing pain. He has had wounds previously a lot of  this looks like chronic venous disease. 6/8; about two thirds of the surface of this wound is still covered in a black necrotic eschar. I changed him from Iodoflex to [place] last week because of complaints of pain. He actually was seen by her neurologist this weeko Peripheral neuropathy as a cause of the pain and they changed him from Neurontin to Lyrica but he still has not had any relief. He says that most of the pain however is in his heel not in his wound per se. He is a diabetic 6/15. This is a patient with what looks to be a venous wound on the left lateral lower leg. He is also a diabetic. Currently being worked up for diabetic neuropathy. He complains of pain out of proportion to the size of the wound although to be fair the entire area here was eschared. We have been working to get a viable surface. We are using Sorbact 6/22; fairly painful wound on the left posterior calf. I am assuming this is been venous he is also a diabetic. His ABI in our clinic was noncompressible however he has easily palpable pulses on his feet. He complains of a lot of pain in the wound also of the left heel and the upper left calf. Some of this may be neuropathy. It is led me to discontinue Iodoflex reduce his compression but he still complains of pain in fact he says he could not take a debridement today. When I first saw this it was completely necrotic surface we have got it down to something that looks a reasonably healthy I have been wondering about biopsying this area however he is on Coumadin and with the pain I have put this off. The major question would be an  inflammatory ulcer such as pyoderma. The patient is not aware of how this started. He does however have a wound history on both legs. 6/29; patient presents for 1 week follow-up. He has been using Hydrofera Blue under Kerlix/Coban. He has tolerated this well. He currently denies signs of infection. He also declines debridement today. He he states it is painful and does not want to have this done. 7/6; Patient presents for 1 week follow-up. He has been using Hydrofera Blue under Kerlix/Coban. He denies signs of infection. He declines debridement today. 7/15; patient presents for 1 week follow-up. He has been using Hydrofera Blue under Kerlix/Coban. He denies signs of infection today. He is agreeable to having debridement done today. 7/20; patient presents for 1 week follow-up. He is in good spirits today. He has been using Hydrofera Blue under Kerlix/Coban. He denies signs of infection today. 7/27; patient presents for 1 week follow-up. He is in good spirits today. He has no issues or complaints today. He denies signs of infection. 08/12/2020 upon evaluation today patient appears to be doing better in regard to his wound. He has been tolerating the dressing changes without complication. With that being said he did have arterial studies and does show that he has a TBI on the right of 0.90 and a TBI on the left of 0.76 which is excellent. There is no evidence of arterial compromise at this point. 8/17; patient presents for 1 week follow-up. He has no issues or complaints today. He denies signs of infection. 8/24; patient presents for 1 week follow-up. He has no issues or complaints today. He denies signs of infection. He continues to tolerate the compression wrap well. 8/31; patient presents for 1 week follow-up. He has no issues or  complaints today. He denies signs of infection. He is in good spirits today. 9/7; patient presents for 1 week follow-up. He is tolerated the compression wrap well. He has no  issues or complaints today. He denies signs of infection. 9/14; patient presents for 1 week follow-up. He has no issues or complaints today. He denies signs of infection. He is in excellent spirits today. Electronic Signature(s) Signed: 09/16/2020 2:22:57 PM By: Kalman Shan DO Entered By: Kalman Shan on 09/16/2020 14:20:20 Shane Bean ([id number] Bean ([id number]) -------------------------------------------------------------------------------- Physical Exam Details Patient Name: Shane Bean Date of Service: 09/16/2020 1:15 PM Medical Record Number: [id number] Patient Account Number: [id number] Date of Birth/Sex: 1955/07/08 (64 y.o. M) Treating RN: Carlene Coria Primary Care Provider: Reeves Dam Other Clinician: Referring Provider: Reeves Dam Treating Provider/Extender: Yaakov Guthrie in Treatment: 17 Constitutional . Cardiovascular . Psychiatric . Notes Left lower extremity: To the posterior aspect there is an open wound with granulation And nonviable tissue present. No signs of infection. Good edema control. Electronic Signature(s) Signed: 09/16/2020 2:22:57 PM By: Kalman Shan DO Entered By: Kalman Shan on 09/16/2020 14:20:52 Shane Bean ([id number]) -------------------------------------------------------------------------------- Physician Orders Details Patient Name: Shane Bean Date of Service: 09/16/2020 1:15 PM Medical Record Number: [id number] Patient Account Number: [id number] Date of Birth/Sex: [birth year] (64 y.o. M) Treating RN: Carlene Coria Primary Care Provider: Reeves Dam Other Clinician: Referring Provider: Reeves Dam Treating Provider/Extender: Yaakov Guthrie in Treatment: 48 Verbal / Phone Orders: No Diagnosis Coding Follow-up Appointments o Return Appointment in 1 week. o Nurse Visit as needed Bathing/ Shower/ Hygiene Wound #1 Left,Posterior Lower Leg o May  shower with wound dressing protected with water repellent cover or cast protector. - cover dressing during shower-do not get it wet Edema Control - Lymphedema / Segmental Compressive Device / Other o Optional: One layer of unna paste to top of compression wrap (to act as an anchor). o Elevate, Exercise Daily and Avoid Standing for Long Periods of Time. o Elevate legs to the level of the heart and pump ankles as often as possible o Elevate leg(s) parallel to the floor when sitting. o DO YOUR BEST to sleep in the bed at night. DO NOT sleep in your recliner. Long hours of sitting in a recliner leads to swelling of the legs and/or potential wounds on your backside. Additional Orders / Instructions o Follow Nutritious Diet and Increase Protein Intake Wound Treatment Wound #1 - Lower Leg Wound Laterality: Left, Posterior Cleanser: Soap and Water 1 x Per Week/30 Days Discharge Instructions: Gently cleanse wound with antibacterial soap, rinse and pat dry prior to dressing wounds Primary Dressing: Hydrofera Blue Ready Transfer Foam, 2.5x2.5 (in/in) 1 x Per Week/30 Days Discharge Instructions: Apply Hydrofera Blue Ready to wound bed as directed Secondary Dressing: ABD Pad 5x9 (in/in) 1 x Per Week/30 Days Discharge Instructions: Cover with ABD pad Compression Wrap: Profore Lite LF 3 Multilayer Compression Bandaging System 1 x Per Week/30 Days Discharge Instructions: Apply 3 multi-layer wrap as prescribed. Compression Stockings: Circaid Juxta Lite Compression Wrap Left Leg Compression Amount: 20-30 mmHG Discharge Instructions: Apply Circaid Juxta Lite Compression Wrap as directed Electronic Signature(s) Signed: 09/16/2020 2:22:57 PM By: Kalman Shan DO Signed: 09/16/2020 3:36:37 PM By: Carlene Coria RN Entered By: Carlene Coria on 09/16/2020 14:14:11 Shane Bean ([id number]) -------------------------------------------------------------------------------- Problem List  Details Patient Name: Shane Bean Date of Service: 09/16/2020 1:15 PM Medical Record Number: [id number] Patient Account Number: [id number] Date of Birth/Sex: [birth year] (64 y.o. M) Treating RN: Epps,  Morey Hummingbird Primary Care Provider: Reeves Dam Other Clinician: Referring Provider: Reeves Dam Treating Provider/Extender: Yaakov Guthrie in Treatment: 17 Active Problems ICD-10 Encounter Code Description Active Date MDM Diagnosis E11.622 Type 2 diabetes mellitus with other skin ulcer 05/18/2020 No Yes I87.2 Venous insufficiency (chronic) (peripheral) 05/18/2020 No Yes 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 layer 05/18/2020 No Yes exposed [place] (primary) hypertension 05/18/2020 No Yes I87.331 Chronic venous hypertension (idiopathic) with ulcer and inflammation of 08/26/2020 No Yes right lower extremity Inactive Problems Resolved Problems Electronic Signature(s) Signed: 09/16/2020 2:22:57 PM By: Kalman Shan DO Entered By: Kalman Shan on 09/16/2020 [place], [place]. ([id number]) -------------------------------------------------------------------------------- Progress Note Details Patient Name: Shane Bean Date of Service: 09/16/2020 1:15 PM Medical Record Number: [id number] Patient Account Number: [id number] Date of Birth/Sex: [birth year] (64 y.o. M) Treating RN: Carlene Coria Primary Care Provider: Reeves Dam Other Clinician: Referring Provider: Reeves Dam Treating Provider/Extender: Yaakov Guthrie in Treatment: 17 Subjective Chief Complaint Information obtained from Patient Left LE Ulcer History of Present Illness (HPI) 05/18/2020 upon evaluation today patient appears for initial evaluation here in clinic concerning issues that he has been having with the wound on his left lateral leg. Fortunately there does not appear to be any signs of obvious an active infection at this time which is  great news. With that being said the patient unfortunately is continued to have issues with pain although he tells me it is gotten a lot better. He was on Keflex as well as Bactrim DS which seems to have done a good job there. His most recent hemoglobin A1c was 9.8 that was on 04/14/2020. Subsequently I do feel like that the patient is making progress here. He does have a history of chronic venous insufficiency as well as hypertension. I do not see any need for antibiotics at this point. 5/25; follow-up of the wound on the left posterior calf. This is completely necrotic on the surface. It does not look infected but it is painful. He is a diabetic but I think there is some suggestion here of chronic venous disease as well. We used Iodoflex under compression last week 6/1; again a completely necrotic surface on this with the wound. We have been using Iodoflex under compression he complains of gnawing pain. He has had wounds previously a lot of this looks like chronic venous disease. 6/8; about two thirds of the surface of this wound is still covered in a black necrotic eschar. I changed him from Iodoflex to [place] last week because of complaints of pain. He actually was seen by her neurologist this weeko Peripheral neuropathy as a cause of the pain and they changed him from Neurontin to Lyrica but he still has not had any relief. He says that most of the pain however is in his heel not in his wound per se. He is a diabetic 6/15. This is a patient with what looks to be a venous wound on the left lateral lower leg. He is also a diabetic. Currently being worked up for diabetic neuropathy. He complains of pain out of proportion to the size of the wound although to be fair the entire area here was eschared. We have been working to get a viable surface. We are using Sorbact 6/22; fairly painful wound on the left posterior calf. I am assuming this is been venous he is also a diabetic. His ABI in our clinic  was noncompressible however he has easily palpable pulses on  his feet. He complains of a lot of pain in the wound also of the left heel and the upper left calf. Some of this may be neuropathy. It is led me to discontinue Iodoflex reduce his compression but he still complains of pain in fact he says he could not take a debridement today. When I first saw this it was completely necrotic surface we have got it down to something that looks a reasonably healthy I have been wondering about biopsying this area however he is on Coumadin and with the pain I have put this off. The major question would be an inflammatory ulcer such as pyoderma. The patient is not aware of how this started. He does however have a wound history on both legs. 6/29; patient presents for 1 week follow-up. He has been using Hydrofera Blue under Kerlix/Coban. He has tolerated this well. He currently denies signs of infection. He also declines debridement today. He he states it is painful and does not want to have this done. 7/6; Patient presents for 1 week follow-up. He has been using Hydrofera Blue under Kerlix/Coban. He denies signs of infection. He declines debridement today. 7/15; patient presents for 1 week follow-up. He has been using Hydrofera Blue under Kerlix/Coban. He denies signs of infection today. He is agreeable to having debridement done today. 7/20; patient presents for 1 week follow-up. He is in good spirits today. He has been using Hydrofera Blue under Kerlix/Coban. He denies signs of infection today. 7/27; patient presents for 1 week follow-up. He is in good spirits today. He has no issues or complaints today. He denies signs of infection. 08/12/2020 upon evaluation today patient appears to be doing better in regard to his wound. He has been tolerating the dressing changes without complication. With that being said he did have arterial studies and does show that he has a TBI on the right of 0.90 and a TBI on the  left of 0.76 which is excellent. There is no evidence of arterial compromise at this point. 8/17; patient presents for 1 week follow-up. He has no issues or complaints today. He denies signs of infection. 8/24; patient presents for 1 week follow-up. He has no issues or complaints today. He denies signs of infection. He continues to tolerate the compression wrap well. 8/31; patient presents for 1 week follow-up. He has no issues or complaints today. He denies signs of infection. He is in good spirits today. 9/7; patient presents for 1 week follow-up. He is tolerated the compression wrap well. He has no issues or complaints today. He denies signs of infection. 9/14; patient presents for 1 week follow-up. He has no issues or complaints today. He denies signs of infection. He is in excellent spirits today. Patient History JOSAFAT, FICKERT ([id number]) Information obtained from Patient. Social History Never smoker, Marital Status - Single, Alcohol Use - Moderate, Drug Use - No History, Caffeine Use - Never. Medical History Eyes Denies history of Cataracts, Glaucoma, Optic Neuritis Ear/Nose/Mouth/Throat Denies history of Chronic sinus problems/congestion Hematologic/Lymphatic Denies history of Anemia, Hemophilia, Human Immunodeficiency Virus, Lymphedema Respiratory Denies history of Aspiration, Asthma, Chronic Obstructive Pulmonary Disease (COPD), Pneumothorax, Sleep Apnea, Tuberculosis Cardiovascular Patient has history of Hypertension Denies history of Angina, Arrhythmia, Congestive Heart Failure, Coronary Artery Disease, Deep Vein Thrombosis, Hypotension, Myocardial Infarction, Peripheral Arterial Disease, Peripheral Venous Disease, Phlebitis, Vasculitis Gastrointestinal Denies history of Cirrhosis , Colitis, Crohn s, Hepatitis A, Hepatitis B Endocrine Patient has history of Type II Diabetes Denies history of Type I Diabetes  Genitourinary Denies history of End Stage Renal  Disease Immunological Denies history of Lupus Erythematosus, Raynaud s, Scleroderma Integumentary (Skin) Denies history of History of Burn, History of pressure wounds Musculoskeletal Denies history of Gout, Rheumatoid Arthritis, Osteoarthritis, Osteomyelitis Neurologic Patient has history of Neuropathy Denies history of Dementia, Quadriplegia, Paraplegia, Seizure Disorder Oncologic Denies history of Received Chemotherapy, Received Radiation Psychiatric Denies history of Anorexia/bulimia, Confinement Anxiety Objective Constitutional Vitals Time Taken: 1:45 PM, Height: 70 in, Weight: 290 lbs, BMI: 41.6, Temperature: 98.2 F, Pulse: 68 bpm, Respiratory Rate: 18 breaths/min, Blood Pressure: 140/70 mmHg. General Notes: Left lower extremity: To the posterior aspect there is an open wound with granulation And nonviable tissue present. No signs of infection. Good edema control. Integumentary (Hair, Skin) Wound #1 status is Open. Original cause of wound was Gradually Appeared. The date acquired was: 04/08/2020. The wound has been in treatment 17 weeks. The wound is located on the Left,Posterior Lower Leg. The wound measures 1.7cm length x 0.7cm width x 0.1cm depth; 0.935cm^2 area and 0.093cm^3 volume. There is Fat Layer (Subcutaneous Tissue) exposed. There is no tunneling or undermining noted. There is a large amount of serosanguineous drainage noted. There is large (67-100%) red, pink granulation within the wound bed. There is a small (1-33%) amount of necrotic tissue within the wound bed including Adherent Slough. Assessment Active Problems ICD-10 Type 2 diabetes mellitus with other skin ulcer Venous insufficiency (chronic) (peripheral) Barthel, Siddhartha V. ([id number]) Non-pressure chronic ulcer of other part of left lower leg with fat layer exposed Essential (primary) hypertension Chronic venous hypertension (idiopathic) with ulcer and inflammation of right lower extremity Patient's wound  continues to improve in size and appearance since last clinic visit. I debrided nonviable tissue. There were no signs of infection. Overall wound appears well-healing. I recommended continuing Hydrofera Blue under compression. Procedures Wound #1 Pre-procedure diagnosis of Wound #1 is a Diabetic Wound/Ulcer of the Lower Extremity located on the Left,Posterior Lower Leg .Severity of Tissue Pre Debridement is: Fat layer exposed. There was a Excisional Skin/Subcutaneous Tissue Debridement with a total area of 1.19 sq cm performed by Kalman Shan, MD. With the following instrument(s): Curette to remove Viable and Non-Viable tissue/material. Material removed includes Subcutaneous Tissue, Slough, Skin: Dermis, and Skin: Epidermis. No specimens were taken. A time out was conducted at 14:13, prior to the start of the procedure. A Minimum amount of bleeding was controlled with Pressure. The procedure was tolerated well with a pain level of 0 throughout and a pain level of 0 following the procedure. Post Debridement Measurements: 98.2cm length x 68cm width x 18cm depth; 94402.34cm^3 volume. Character of Wound/Ulcer Post Debridement is improved. Severity of Tissue Post Debridement is: Fat layer exposed. Post procedure Diagnosis Wound #1: Same as Pre-Procedure Pre-procedure diagnosis of Wound #1 is a Diabetic Wound/Ulcer of the Lower Extremity located on the Left,Posterior Lower Leg . There was a Three Layer Compression Therapy Procedure by Carlene Coria, RN. Post procedure Diagnosis Wound #1: Same as Pre-Procedure Plan Follow-up Appointments: Return Appointment in 1 week. Nurse Visit as needed Bathing/ Shower/ Hygiene: Wound #1 Left,Posterior Lower Leg: May shower with wound dressing protected with water repellent cover or cast protector. - cover dressing during shower-do not get it wet Edema Control - Lymphedema / Segmental Compressive Device / Other: Optional: One layer of unna paste to top of  compression wrap (to act as an anchor). Elevate, Exercise Daily and Avoid Standing for Long Periods of Time. Elevate legs to the level of the heart and pump ankles  as often as possible Elevate leg(s) parallel to the floor when sitting. DO YOUR BEST to sleep in the bed at night. DO NOT sleep in your recliner. Long hours of sitting in a recliner leads to swelling of the legs and/or potential wounds on your backside. Additional Orders / Instructions: Follow Nutritious Diet and Increase Protein Intake WOUND #1: - Lower Leg Wound Laterality: Left, Posterior Cleanser: Soap and Water 1 x Per Week/30 Days Discharge Instructions: Gently cleanse wound with antibacterial soap, rinse and pat dry prior to dressing wounds Primary Dressing: Hydrofera Blue Ready Transfer Foam, 2.5x2.5 (in/in) 1 x Per Week/30 Days Discharge Instructions: Apply Hydrofera Blue Ready to wound bed as directed Secondary Dressing: ABD Pad 5x9 (in/in) 1 x Per Week/30 Days Discharge Instructions: Cover with ABD pad Compression Wrap: Profore Lite LF 3 Multilayer Compression Bandaging System 1 x Per Week/30 Days Discharge Instructions: Apply 3 multi-layer wrap as prescribed. Compression Stockings: Circaid Juxta Lite Compression Wrap Compression Amount: 20-30 mmHg (left) Discharge Instructions: Apply Circaid Juxta Lite Compression Wrap as directed 1. Hydrofera Blue under 3 layer compression 2. In office sharp debridement 3. Follow-up in 1 week Electronic Signature(s) FARAAZ, STEC ([id number]) Signed: 09/16/2020 2:22:57 PM By: Kalman Shan DO Entered By: Kalman Shan on 09/16/2020 14:21:40 Shane Bean ([id number]) -------------------------------------------------------------------------------- ROS/PFSH Details Patient Name: Shane Bean Date of Service: 09/16/2020 1:15 PM Medical Record Number: [id number] Patient Account Number: [id number] Date of Birth/Sex: [birth year] (64 y.o. M) Treating RN: Carlene Coria Primary Care Provider: Reeves Dam Other Clinician: Referring Provider: Reeves Dam Treating Provider/Extender: Yaakov Guthrie in Treatment: 17 Information Obtained From Patient Eyes Medical History: Negative for: Cataracts; Glaucoma; Optic Neuritis Ear/Nose/Mouth/Throat Medical History: Negative for: Chronic sinus problems/congestion Hematologic/Lymphatic Medical History: Negative for: Anemia; Hemophilia; Human Immunodeficiency Virus; Lymphedema Respiratory Medical History: Negative for: Aspiration; Asthma; Chronic Obstructive Pulmonary Disease (COPD); Pneumothorax; Sleep Apnea; Tuberculosis Cardiovascular Medical History: Positive for: Hypertension Negative for: Angina; Arrhythmia; Congestive Heart Failure; Coronary Artery Disease; Deep Vein Thrombosis; Hypotension; Myocardial Infarction; Peripheral Arterial Disease; Peripheral Venous Disease; Phlebitis; Vasculitis Gastrointestinal Medical History: Negative for: Cirrhosis ; Colitis; Crohnos; Hepatitis A; Hepatitis B Endocrine Medical History: Positive for: Type II Diabetes Negative for: Type I Diabetes Time with diabetes: 3 Treated with: Oral agents Blood sugar tested every day: No Genitourinary Medical History: Negative for: End Stage Renal Disease Immunological Medical History: Negative for: Lupus Erythematosus; Raynaudos; Scleroderma Integumentary (Skin) Medical History: Negative for: History of Burn; History of pressure wounds Sherwin, Mats V. ([id number]) Musculoskeletal Medical History: Negative for: Gout; Rheumatoid Arthritis; Osteoarthritis; Osteomyelitis Neurologic Medical History: Positive for: Neuropathy Negative for: Dementia; Quadriplegia; Paraplegia; Seizure Disorder Oncologic Medical History: Negative for: Received Chemotherapy; Received Radiation Psychiatric Medical History: Negative for: Anorexia/bulimia; Confinement Anxiety Immunizations Pneumococcal Vaccine: Received  Pneumococcal Vaccination: Yes Received Pneumococcal Vaccination On or After 60th Birthday: No Implantable Devices None Family and Social History Never smoker; Marital Status - Single; Alcohol Use: Moderate; Drug Use: No History; Caffeine Use: Never; Financial Concerns: No; Food, Clothing or Shelter Needs: No; Support System Lacking: No; Transportation Concerns: No Electronic Signature(s) Signed: 09/16/2020 2:22:57 PM By: Kalman Shan DO Signed: 09/16/2020 3:36:37 PM By: Carlene Coria RN Entered By: Kalman Shan on 09/16/2020 14:20:26 Shane Bean ([id number]) -------------------------------------------------------------------------------- [place] Details Patient Name: Shane Bean Date of Service: 09/16/2020 Medical Record Number: [id number] Patient Account Number: [id number] Date of Birth/Sex: 11-Aug-1955 (64 y.o. M) Treating RN: Carlene Coria Primary Care Provider: Reeves Dam Other Clinician: Referring Provider: Reeves Dam Treating Provider/Extender: Yaakov Guthrie in  Treatment: 17 Diagnosis Coding ICD-10 Codes Code Description E11.622 Type 2 diabetes mellitus with other skin ulcer I87.2 Venous insufficiency (chronic) (peripheral) 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 layer exposed I10 Essential (primary) hypertension I87.331 Chronic venous hypertension (idiopathic) with ulcer and inflammation of right lower extremity Facility Procedures CPT4 Code: [id number] Description: [id number] - DEB SUBQ TISSUE 20 SQ CM/< Modifier: Quantity: 1 CPT4 Code: Description: ICD-10 Diagnosis Description 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 layer I87.2 Venous insufficiency (chronic) (peripheral) E11.622 Type 2 diabetes mellitus with other skin ulcer I10 Essential (primary)  hypertension Modifier: exposed Quantity: Physician Procedures CPT4 Code: [id number] Description: 11042 - WC PHYS SUBQ TISS 20  SQ CM Modifier: Quantity: 1 CPT4 Code: Description: ICD-10 Diagnosis Description 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

## 2020-09-23 ENCOUNTER — Encounter (HOSPITAL_BASED_OUTPATIENT_CLINIC_OR_DEPARTMENT_OTHER): Payer: Medicare (Managed Care) | Admitting: Internal Medicine

## 2020-09-23 ENCOUNTER — Other Ambulatory Visit: Payer: Self-pay

## 2020-09-23 DIAGNOSIS — E11622 Type 2 diabetes mellitus with other skin ulcer: Secondary | ICD-10-CM

## 2020-09-23 DIAGNOSIS — I872 Venous insufficiency (chronic) (peripheral): Secondary | ICD-10-CM | POA: Diagnosis not present

## 2020-09-23 DIAGNOSIS — I87331 Chronic venous hypertension (idiopathic) with ulcer and inflammation of right lower extremity: Secondary | ICD-10-CM | POA: Diagnosis not present

## 2020-09-23 DIAGNOSIS — L97822 Non-pressure chronic ulcer of other part of left lower leg with fat layer exposed: Secondary | ICD-10-CM

## 2020-09-23 NOTE — Progress Notes (Signed)
RIGGINS, CISEK ([id number]) Visit Report for 09/23/2020 Chief Complaint Document Details Patient Name: Shane Bean, Shane Bean Date of Service: 09/23/2020 1:45 PM Medical Record Number: [id number] Patient Account Number: [id number] Date of Birth/Sex: 1955-03-31 (64 y.o. M) Treating RN: Donnamarie Poag Primary Care Provider: Reeves Dam Other Clinician: Referring Provider: Reeves Dam Treating Provider/Extender: Yaakov Guthrie in Treatment: 18 Information Obtained from: Patient Chief Complaint Left LE Ulcer Electronic Signature(s) Signed: 09/23/2020 3:11:09 PM By: Kalman Shan DO Entered By: Kalman Shan on 09/23/2020 15:08:00 Shane Bean ([id number]) -------------------------------------------------------------------------------- HPI Details Patient Name: Shane Bean Date of Service: 09/23/2020 1:45 PM Medical Record Number: [id number] Patient Account Number: [id number] Date of Birth/Sex: 1955/11/28 (64 y.o. M) Treating RN: Donnamarie Poag Primary Care Provider: Reeves Dam Other Clinician: Referring Provider: Reeves Dam Treating Provider/Extender: Yaakov Guthrie in Treatment: 18 History of Present Illness HPI Description: 05/18/2020 upon evaluation today patient appears for initial evaluation here in clinic concerning issues that he has been having with the wound on his left lateral leg. Fortunately there does not appear to be any signs of obvious an active infection at this time which is great news. With that being said the patient unfortunately is continued to have issues with pain although he tells me it is gotten a lot better. He was on Keflex as well as Bactrim DS which seems to have done a good job there. His most recent hemoglobin A1c was 9.8 that was on 04/14/2020. Subsequently I do feel like that the patient is making progress here. He does have a history of chronic venous insufficiency as well as hypertension. I do not see any need for  antibiotics at this point. 5/25; follow-up of the wound on the left posterior calf. This is completely necrotic on the surface. It does not look infected but it is painful. He is a diabetic but I think there is some suggestion here of chronic venous disease as well. We used Iodoflex under compression last week 6/1; again a completely necrotic surface on this with the wound. We have been using Iodoflex under compression he complains of gnawing pain. He has had wounds previously a lot of this looks like chronic venous disease. 6/8; about two thirds of the surface of this wound is still covered in a black necrotic eschar. I changed him from Iodoflex to [place] last week because of complaints of pain. He actually was seen by her neurologist this weeko Peripheral neuropathy as a cause of the pain and they changed him from Neurontin to Lyrica but he still has not had any relief. He says that most of the pain however is in his heel not in his wound per se. He is a diabetic 6/15. This is a patient with what looks to be a venous wound on the left lateral lower leg. He is also a diabetic. Currently being worked up for diabetic neuropathy. He complains of pain out of proportion to the size of the wound although to be fair the entire area here was eschared. We have been working to get a viable surface. We are using Sorbact 6/22; fairly painful wound on the left posterior calf. I am assuming this is been venous he is also a diabetic. His ABI in our clinic was noncompressible however he has easily palpable pulses on his feet. He complains of a lot of pain in the wound also of the left heel and the upper left calf. Some of this may be neuropathy. It is led me to discontinue Iodoflex reduce  his compression but he still complains of pain in fact he says he could not take a debridement today. When I first saw this it was completely necrotic surface we have got it down to something that looks a reasonably healthy I  have been wondering about biopsying this area however he is on Coumadin and with the pain I have put this off. The major question would be an inflammatory ulcer such as pyoderma. The patient is not aware of how this started. He does however have a wound history on both legs. 6/29; patient presents for 1 week follow-up. He has been using Hydrofera Blue under Kerlix/Coban. He has tolerated this well. He currently denies signs of infection. He also declines debridement today. He he states it is painful and does not want to have this done. 7/6; Patient presents for 1 week follow-up. He has been using Hydrofera Blue under Kerlix/Coban. He denies signs of infection. He declines debridement today. 7/15; patient presents for 1 week follow-up. He has been using Hydrofera Blue under Kerlix/Coban. He denies signs of infection today. He is agreeable to having debridement done today. 7/20; patient presents for 1 week follow-up. He is in good spirits today. He has been using Hydrofera Blue under Kerlix/Coban. He denies signs of infection today. 7/27; patient presents for 1 week follow-up. He is in good spirits today. He has no issues or complaints today. He denies signs of infection. 08/12/2020 upon evaluation today patient appears to be doing better in regard to his wound. He has been tolerating the dressing changes without complication. With that being said he did have arterial studies and does show that he has a TBI on the right of 0.90 and a TBI on the left of 0.76 which is excellent. There is no evidence of arterial compromise at this point. 8/17; patient presents for 1 week follow-up. He has no issues or complaints today. He denies signs of infection. 8/24; patient presents for 1 week follow-up. He has no issues or complaints today. He denies signs of infection. He continues to tolerate the compression wrap well. 8/31; patient presents for 1 week follow-up. He has no issues or complaints today. He denies  signs of infection. He is in good spirits today. 9/7; patient presents for 1 week follow-up. He is tolerated the compression wrap well. He has no issues or complaints today. He denies signs of infection. 9/14; patient presents for 1 week follow-up. He has no issues or complaints today. He denies signs of infection. He is in excellent spirits today. 9/21; patient presents for 1 week follow-up. He has no issues or complaints today. Electronic Signature(s) Signed: 09/23/2020 3:11:09 PM By: Kalman Shan DO Entered By: Kalman Shan on 09/23/2020 15:08:30 Shane Bean ([id number] Bean ([id number]) -------------------------------------------------------------------------------- Physical Exam Details Patient Name: Shane Bean Date of Service: 09/23/2020 1:45 PM Medical Record Number: [id number] Patient Account Number: [id number] Date of Birth/Sex: 10-05-1955 (64 y.o. M) Treating RN: Donnamarie Poag Primary Care Provider: Reeves Dam Other Clinician: Referring Provider: Reeves Dam Treating Provider/Extender: Yaakov Guthrie in Treatment: 18 Constitutional . Cardiovascular . Psychiatric . Notes Left lower extremity: To the posterior aspect there is an open wound with granulation tissue present. Appears well-healing. No signs of infection. Good edema control. Electronic Signature(s) Signed: 09/23/2020 3:11:09 PM By: Kalman Shan DO Entered By: Kalman Shan on 09/23/2020 Wiggins, [place]. ([id number]) -------------------------------------------------------------------------------- Physician Orders Details Patient Name: Shane Bean Date of Service: 09/23/2020 1:45 PM Medical Record Number: [id number] Patient Account  Number: [id number] Date of Birth/Sex: Feb 01, 1955 (64 y.o. M) Treating RN: Donnamarie Poag Primary Care Provider: Reeves Dam Other Clinician: Referring Provider: Reeves Dam Treating Provider/Extender: Yaakov Guthrie in Treatment: 77 Verbal / Phone Orders: No Diagnosis Coding Follow-up Appointments o Return Appointment in 1 week. o Nurse Visit as needed Bathing/ Shower/ Hygiene Wound #1 Left,Posterior Lower Leg o May shower with wound dressing protected with water repellent cover or cast protector. - cover dressing during shower-do not get it wet o No tub bath. Edema Control - Lymphedema / Segmental Compressive Device / Other o Optional: One layer of unna paste to top of compression wrap (to act as an anchor). o Elevate, Exercise Daily and Avoid Standing for Long Periods of Time. o Elevate legs to the level of the heart and pump ankles as often as possible o Elevate leg(s) parallel to the floor when sitting. o DO YOUR BEST to sleep in the bed at night. DO NOT sleep in your recliner. Long hours of sitting in a recliner leads to swelling of the legs and/or potential wounds on your backside. Additional Orders / Instructions o Follow Nutritious Diet and Increase Protein Intake Wound Treatment Wound #1 - Lower Leg Wound Laterality: Left, Posterior Cleanser: Soap and Water 1 x Per Week/30 Days Discharge Instructions: Gently cleanse wound with antibacterial soap, rinse and pat dry prior to dressing wounds Primary Dressing: Hydrofera Blue Ready Transfer Foam, 2.5x2.5 (in/in) 1 x Per Week/30 Days Discharge Instructions: Apply Hydrofera Blue Ready to wound bed as directed Secondary Dressing: ABD Pad 5x9 (in/in) 1 x Per Week/30 Days Discharge Instructions: Cover with ABD pad Compression Wrap: Profore Lite LF 3 Multilayer Compression Bandaging System 1 x Per Week/30 Days Discharge Instructions: Apply 3 multi-layer wrap as prescribed. Compression Stockings: Circaid Juxta Lite Compression Wrap Left Leg Compression Amount: 20-30 mmHG Discharge Instructions: Apply Circaid Juxta Lite Compression Wrap as directed Electronic Signature(s) Signed: 09/23/2020 3:11:09 PM By: Kalman Shan DO Signed: 09/23/2020 3:15:27 PM By: Donnamarie Poag Entered By: Donnamarie Poag on 09/23/2020 14:51:27 Shane Bean ([id number]) -------------------------------------------------------------------------------- Problem List Details Patient Name: Shane Bean Date of Service: 09/23/2020 1:45 PM Medical Record Number: [id number] Patient Account Number: [id number] Date of Birth/Sex: November 08, 1955 (64 y.o. M) Treating RN: Donnamarie Poag Primary Care Provider: Reeves Dam Other Clinician: Referring Provider: Reeves Dam Treating Provider/Extender: Yaakov Guthrie in Treatment: 18 Active Problems ICD-10 Encounter Code Description Active Date MDM Diagnosis E11.622 Type 2 diabetes mellitus with other skin ulcer 05/18/2020 No Yes I87.2 Venous insufficiency (chronic) (peripheral) 05/18/2020 No Yes 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 layer 05/18/2020 No Yes exposed Shane Bean (primary) hypertension 05/18/2020 No Yes I87.331 Chronic venous hypertension (idiopathic) with ulcer and inflammation of 08/26/2020 No Yes right lower extremity Inactive Problems Resolved Problems Electronic Signature(s) Signed: 09/23/2020 3:11:09 PM By: Kalman Shan DO Entered By: Kalman Shan on 09/23/2020 15:07:39 Shane Bean ([id number]) -------------------------------------------------------------------------------- Progress Note Details Patient Name: Shane Bean Date of Service: 09/23/2020 1:45 PM Medical Record Number: [id number] Patient Account Number: [id number] Date of Birth/Sex: [birth year] (64 y.o. M) Treating RN: Donnamarie Poag Primary Care Provider: Reeves Dam Other Clinician: Referring Provider: Reeves Dam Treating Provider/Extender: Yaakov Guthrie in Treatment: 18 Subjective Chief Complaint Information obtained from Patient Left LE Ulcer History of Present Illness (HPI) 05/18/2020 upon evaluation today patient  appears for initial evaluation here in clinic concerning issues that he has been having with the wound on his left lateral leg. Fortunately there  does not appear to be any signs of obvious an active infection at this time which is great news. With that being said the patient unfortunately is continued to have issues with pain although he tells me it is gotten a lot better. He was on Keflex as well as Bactrim DS which seems to have done a good job there. His most recent hemoglobin A1c was 9.8 that was on 04/14/2020. Subsequently I do feel like that the patient is making progress here. He does have a history of chronic venous insufficiency as well as hypertension. I do not see any need for antibiotics at this point. 5/25; follow-up of the wound on the left posterior calf. This is completely necrotic on the surface. It does not look infected but it is painful. He is a diabetic but I think there is some suggestion here of chronic venous disease as well. We used Iodoflex under compression last week 6/1; again a completely necrotic surface on this with the wound. We have been using Iodoflex under compression he complains of gnawing pain. He has had wounds previously a lot of this looks like chronic venous disease. 6/8; about two thirds of the surface of this wound is still covered in a black necrotic eschar. I changed him from Iodoflex to [place] last week because of complaints of pain. He actually was seen by her neurologist this weeko Peripheral neuropathy as a cause of the pain and they changed him from Neurontin to Lyrica but he still has not had any relief. He says that most of the pain however is in his heel not in his wound per se. He is a diabetic 6/15. This is a patient with what looks to be a venous wound on the left lateral lower leg. He is also a diabetic. Currently being worked up for diabetic neuropathy. He complains of pain out of proportion to the size of the wound although to be fair the  entire area here was eschared. We have been working to get a viable surface. We are using Sorbact 6/22; fairly painful wound on the left posterior calf. I am assuming this is been venous he is also a diabetic. His ABI in our clinic was noncompressible however he has easily palpable pulses on his feet. He complains of a lot of pain in the wound also of the left heel and the upper left calf. Some of this may be neuropathy. It is led me to discontinue Iodoflex reduce his compression but he still complains of pain in fact he says he could not take a debridement today. When I first saw this it was completely necrotic surface we have got it down to something that looks a reasonably healthy I have been wondering about biopsying this area however he is on Coumadin and with the pain I have put this off. The major question would be an inflammatory ulcer such as pyoderma. The patient is not aware of how this started. He does however have a wound history on both legs. 6/29; patient presents for 1 week follow-up. He has been using Hydrofera Blue under Kerlix/Coban. He has tolerated this well. He currently denies signs of infection. He also declines debridement today. He he states it is painful and does not want to have this done. 7/6; Patient presents for 1 week follow-up. He has been using Hydrofera Blue under Kerlix/Coban. He denies signs of infection. He declines debridement today. 7/15; patient presents for 1 week follow-up. He has been using Hydrofera Blue under Kerlix/Coban. He  denies signs of infection today. He is agreeable to having debridement done today. 7/20; patient presents for 1 week follow-up. He is in good spirits today. He has been using Hydrofera Blue under Kerlix/Coban. He denies signs of infection today. 7/27; patient presents for 1 week follow-up. He is in good spirits today. He has no issues or complaints today. He denies signs of infection. 08/12/2020 upon evaluation today patient appears  to be doing better in regard to his wound. He has been tolerating the dressing changes without complication. With that being said he did have arterial studies and does show that he has a TBI on the right of 0.90 and a TBI on the left of 0.76 which is excellent. There is no evidence of arterial compromise at this point. 8/17; patient presents for 1 week follow-up. He has no issues or complaints today. He denies signs of infection. 8/24; patient presents for 1 week follow-up. He has no issues or complaints today. He denies signs of infection. He continues to tolerate the compression wrap well. 8/31; patient presents for 1 week follow-up. He has no issues or complaints today. He denies signs of infection. He is in good spirits today. 9/7; patient presents for 1 week follow-up. He is tolerated the compression wrap well. He has no issues or complaints today. He denies signs of infection. 9/14; patient presents for 1 week follow-up. He has no issues or complaints today. He denies signs of infection. He is in excellent spirits today. 9/21; patient presents for 1 week follow-up. He has no issues or complaints today. Shane Bean, Shane Bean ([id number]) Patient History Information obtained from Patient. Social History Never smoker, Marital Status - Single, Alcohol Use - Moderate, Drug Use - No History, Caffeine Use - Never. Medical History Eyes Denies history of Cataracts, Glaucoma, Optic Neuritis Ear/Nose/Mouth/Throat Denies history of Chronic sinus problems/congestion Hematologic/Lymphatic Denies history of Anemia, Hemophilia, Human Immunodeficiency Virus, Lymphedema Respiratory Denies history of Aspiration, Asthma, Chronic Obstructive Pulmonary Disease (COPD), Pneumothorax, Sleep Apnea, Tuberculosis Cardiovascular Patient has history of Hypertension Denies history of Angina, Arrhythmia, Congestive Heart Failure, Coronary Artery Disease, Deep Vein Thrombosis, Hypotension, Myocardial Infarction,  Peripheral Arterial Disease, Peripheral Venous Disease, Phlebitis, Vasculitis Gastrointestinal Denies history of Cirrhosis , Colitis, Crohn s, Hepatitis A, Hepatitis B Endocrine Patient has history of Type II Diabetes Denies history of Type I Diabetes Genitourinary Denies history of End Stage Renal Disease Immunological Denies history of Lupus Erythematosus, Raynaud s, Scleroderma Integumentary (Skin) Denies history of History of Burn, History of pressure wounds Musculoskeletal Denies history of Gout, Rheumatoid Arthritis, Osteoarthritis, Osteomyelitis Neurologic Patient has history of Neuropathy Denies history of Dementia, Quadriplegia, Paraplegia, Seizure Disorder Oncologic Denies history of Received Chemotherapy, Received Radiation Psychiatric Denies history of Anorexia/bulimia, Confinement Anxiety Objective Constitutional Vitals Time Taken: 2:17 PM, Height: 70 in, Weight: 290 lbs, BMI: 41.6, Temperature: 98.3 F, Pulse: 68 bpm, Respiratory Rate: 16 breaths/min, Blood Pressure: 127/61 mmHg. General Notes: Left lower extremity: To the posterior aspect there is an open wound with granulation tissue present. Appears well-healing. No signs of infection. Good edema control. Integumentary (Hair, Skin) Wound #1 status is Open. Original cause of wound was Gradually Appeared. The date acquired was: 04/08/2020. The wound has been in treatment 18 weeks. The wound is located on the Left,Posterior Lower Leg. The wound measures 1.1cm length x 0.7cm width x 0.1cm depth; 0.605cm^2 area and 0.06cm^3 volume. There is Fat Layer (Subcutaneous Tissue) exposed. There is no tunneling or undermining noted. There is a large amount of serosanguineous drainage noted.  There is large (67-100%) red, pink granulation within the wound bed. There is a small (1-33%) amount of necrotic tissue within the wound bed including Adherent Slough. Assessment Active Problems ICD-10 Shane Bean, Shane Bean ([id number]) Type 2  diabetes mellitus with other skin ulcer Venous insufficiency (chronic) (peripheral) Non-pressure chronic ulcer of other part of left lower leg with fat layer exposed Essential (primary) hypertension Chronic venous hypertension (idiopathic) with ulcer and inflammation of right lower extremity Patient's wound continues to improve in size and appearance. I recommended continuing Hydrofera Blue under compression wrap. Follow-up in 1 week Procedures Wound #1 Pre-procedure diagnosis of Wound #1 is a Diabetic Wound/Ulcer of the Lower Extremity located on the Left,Posterior Lower Leg . There was a Three Layer Compression Therapy Procedure by Donnamarie Poag, RN. Post procedure Diagnosis Wound #1: Same as Pre-Procedure Plan Follow-up Appointments: Return Appointment in 1 week. Nurse Visit as needed Bathing/ Shower/ Hygiene: Wound #1 Left,Posterior Lower Leg: May shower with wound dressing protected with water repellent cover or cast protector. - cover dressing during shower-do not get it wet No tub bath. Edema Control - Lymphedema / Segmental Compressive Device / Other: Optional: One layer of unna paste to top of compression wrap (to act as an anchor). Elevate, Exercise Daily and Avoid Standing for Long Periods of Time. Elevate legs to the level of the heart and pump ankles as often as possible Elevate leg(s) parallel to the floor when sitting. DO YOUR BEST to sleep in the bed at night. DO NOT sleep in your recliner. Long hours of sitting in a recliner leads to swelling of the legs and/or potential wounds on your backside. Additional Orders / Instructions: Follow Nutritious Diet and Increase Protein Intake WOUND #1: - Lower Leg Wound Laterality: Left, Posterior Cleanser: Soap and Water 1 x Per Week/30 Days Discharge Instructions: Gently cleanse wound with antibacterial soap, rinse and pat dry prior to dressing wounds Primary Dressing: Hydrofera Blue Ready Transfer Foam, 2.5x2.5 (in/in) 1 x Per  Week/30 Days Discharge Instructions: Apply Hydrofera Blue Ready to wound bed as directed Secondary Dressing: ABD Pad 5x9 (in/in) 1 x Per Week/30 Days Discharge Instructions: Cover with ABD pad Compression Wrap: Profore Lite LF 3 Multilayer Compression Bandaging System 1 x Per Week/30 Days Discharge Instructions: Apply 3 multi-layer wrap as prescribed. Compression Stockings: Circaid Juxta Lite Compression Wrap Compression Amount: 20-30 mmHg (left) Discharge Instructions: Apply Circaid Juxta Lite Compression Wrap as directed 1. Hydrofera Blue under 3 layer compression 2. Follow-up in 1 week Electronic Signature(s) Signed: 09/23/2020 3:11:09 PM By: Kalman Shan DO Entered By: Kalman Shan on 09/23/2020 15:09:53 Shane Bean ([id number]) -------------------------------------------------------------------------------- ROS/PFSH Details Patient Name: Shane Bean Date of Service: 09/23/2020 1:45 PM Medical Record Number: [id number] Patient Account Number: [id number] Date of Birth/Sex: 19-Jun-1955 (64 y.o. M) Treating RN: Donnamarie Poag Primary Care Provider: Reeves Dam Other Clinician: Referring Provider: Reeves Dam Treating Provider/Extender: Yaakov Guthrie in Treatment: 18 Information Obtained From Patient Eyes Medical History: Negative for: Cataracts; Glaucoma; Optic Neuritis Ear/Nose/Mouth/Throat Medical History: Negative for: Chronic sinus problems/congestion Hematologic/Lymphatic Medical History: Negative for: Anemia; Hemophilia; Human Immunodeficiency Virus; Lymphedema Respiratory Medical History: Negative for: Aspiration; Asthma; Chronic Obstructive Pulmonary Disease (COPD); Pneumothorax; Sleep Apnea; Tuberculosis Cardiovascular Medical History: Positive for: Hypertension Negative for: Angina; Arrhythmia; Congestive Heart Failure; Coronary Artery Disease; Deep Vein Thrombosis; Hypotension; Myocardial Infarction; Peripheral Arterial Disease;  Peripheral Venous Disease; Phlebitis; Vasculitis Gastrointestinal Medical History: Negative for: Cirrhosis ; Colitis; Crohnos; Hepatitis A; Hepatitis B Endocrine Medical History: Positive for: Type II Diabetes Negative  for: Type I Diabetes Time with diabetes: 3 Treated with: Oral agents Blood sugar tested every day: No Genitourinary Medical History: Negative for: End Stage Renal Disease Immunological Medical History: Negative for: Lupus Erythematosus; Raynaudos; Scleroderma Integumentary (Skin) Medical History: Negative for: History of Burn; History of pressure wounds Shane Bean, Shane V. ([id number]) Musculoskeletal Medical History: Negative for: Gout; Rheumatoid Arthritis; Osteoarthritis; Osteomyelitis Neurologic Medical History: Positive for: Neuropathy Negative for: Dementia; Quadriplegia; Paraplegia; Seizure Disorder Oncologic Medical History: Negative for: Received Chemotherapy; Received Radiation Psychiatric Medical History: Negative for: Anorexia/bulimia; Confinement Anxiety Immunizations Pneumococcal Vaccine: Received Pneumococcal Vaccination: Yes Received Pneumococcal Vaccination On or After 60th Birthday: No Implantable Devices None Family and Social History Never smoker; Marital Status - Single; Alcohol Use: Moderate; Drug Use: No History; Caffeine Use: Never; Financial Concerns: No; Food, Clothing or Shelter Needs: No; Support System Lacking: No; Transportation Concerns: No Electronic Signature(s) Signed: 09/23/2020 3:11:09 PM By: Kalman Shan DO Signed: 09/23/2020 3:15:27 PM By: Donnamarie Poag Entered By: Kalman Shan on 09/23/2020 15:08:37 Shane Bean ([id number]) -------------------------------------------------------------------------------- [place] Details Patient Name: Shane Bean Date of Service: 09/23/2020 Medical Record Number: [id number] Patient Account Number: [id number] Date of Birth/Sex: 19-Jul-1955 (64 y.o. M) Treating RN:  Donnamarie Poag Primary Care Provider: Reeves Dam Other Clinician: Referring Provider: Reeves Dam Treating Provider/Extender: Yaakov Guthrie in Treatment: 18 Diagnosis Coding ICD-10 Codes Code Description E11.622 Type 2 diabetes mellitus with other skin ulcer I87.2 Venous insufficiency (chronic) (peripheral) 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 layer exposed I10 Essential (primary) hypertension I87.331 Chronic venous hypertension (idiopathic) with ulcer and inflammation of right lower extremity Facility Procedures CPT4 Code: 97948016 Description: (Facility Use Only) [phone] - [facility] LT LEG Modifier: Quantity: 1 Physician Procedures CPT4 Code Description: [id number] - WC PHYS LEVEL 3 - EST PT Modifier: Quantity: 1 CPT4 Code Description: ICD-10 Diagnosis Description E11.622 Type 2 diabetes mellitus with other skin ulcer I87.2 Venous insufficiency (chronic) (peripheral) 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

## 2020-09-23 NOTE — Progress Notes (Signed)
Shane, Bean ([id number]) Visit Report for 09/23/2020 Arrival Information Details Patient Name: Shane Bean, Shane Bean Date of Service: 09/23/2020 1:45 PM Medical Record Number: [id number] Patient Account Number: [id number] Date of Birth/Sex: [birth year] (64 y.o. M) Treating RN: Donnamarie Poag Primary Care Frankie Scipio: Reeves Dam Other Clinician: Referring Oona Trammel: Reeves Dam Treating Jermani Pund/Extender: Yaakov Guthrie in Treatment: 18 Visit Information History Since Last Visit Added or deleted any medications: No Patient Arrived: Ambulatory Had a fall or experienced change in No Arrival Time: 14:15 activities of daily living that may affect Accompanied By: self risk of falls: Transfer Assistance: None Hospitalized since last visit: No Patient Identification Verified: Yes Has Dressing in Place as Prescribed: Yes Secondary Verification Process Completed: Yes Has Compression in Place as Prescribed: Yes Patient Requires Transmission-Based No Pain Present Now: Yes Precautions: Patient Has Alerts: Yes Patient Alerts: Patient on Blood Thinner 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 ([id number]) -------------------------------------------------------------------------------- Clinic Level of Care Assessment Details Patient Name: Shane Bean Date of Service: 09/23/2020 1:45 PM Medical Record Number: [id number] Patient Account Number: [id number] Date of Birth/Sex: 1955-07-27 (64 y.o. M) Treating RN: Donnamarie Poag Primary Care Lukus Binion: Reeves Dam Other Clinician: Referring [name] [name]: Reeves Dam Treating Yanci Bachtell/Extender: Yaakov Guthrie in Treatment: 18 Clinic Level of Care Assessment Items TOOL 1 Quantity Score []  - Use when EandM and Procedure is performed on INITIAL visit 0 ASSESSMENTS - Nursing Assessment /  Reassessment []  - General Physical Exam (combine w/ comprehensive assessment (listed just below) when performed on new 0 pt. evals) []  - 0 Comprehensive Assessment (HX, ROS, Risk Assessments, Wounds Hx, etc.) ASSESSMENTS - Wound and Skin Assessment / Reassessment []  - Dermatologic / Skin Assessment (not related to wound area) 0 ASSESSMENTS - Ostomy and/or Continence Assessment and Care []  - Incontinence Assessment and Management 0 []  - 0 Ostomy Care Assessment and Management (repouching, etc.) PROCESS - Coordination of Care []  - Simple Patient / Family Education for ongoing care 0 []  - 0 Complex (extensive) Patient / Family Education for ongoing care []  - 0 Staff obtains Programmer, systems, Records, Test Results / Process Orders []  - 0 Staff telephones HHA, Nursing Homes / Clarify orders / etc []  - 0 Routine Transfer to another Facility (non-emergent condition) []  - 0 Routine Hospital Admission (non-emergent condition) []  - 0 New Admissions / Biomedical engineer / Ordering NPWT, Apligraf, etc. []  - 0 Emergency Hospital Admission (emergent condition) PROCESS - Special Needs []  - Pediatric / Minor Patient Management 0 []  - 0 Isolation Patient Management []  - 0 Hearing / Language / Visual special needs []  - 0 Assessment of Community assistance (transportation, D/C planning, etc.) []  - 0 Additional assistance / Altered mentation []  - 0 Support Surface(s) Assessment (bed, cushion, seat, etc.) INTERVENTIONS - Miscellaneous []  - External ear exam 0 []  - 0 Patient Transfer (multiple staff / Civil Service fast streamer / Similar devices) []  - 0 Simple Staple / Suture removal (25 or less) []  - 0 Complex Staple / Suture removal (26 or more) []  - 0 Hypo/Hyperglycemic Management (do not check if billed separately) []  - 0 Ankle / Brachial Index (ABI) - do not check if billed separately Has the patient been seen at the hospital within the last three years: Yes Total Score: 0 Level Of Care:  ____ Shane Bean ([id number]) Electronic Signature(s) Signed: 09/23/2020 3:15:27 PM By: Donnamarie Poag Entered ByDonnamarie Poag on 09/23/2020 14:51:35  JASHAWN, FLOYD ([id number]) -------------------------------------------------------------------------------- Compression Therapy Details Patient Name: Shane, Bean Date of Service: 09/23/2020 1:45 PM Medical Record Number: [id number] Patient Account Number: [id number] Date of Birth/Sex: 1955-06-06 (64 y.o. M) Treating RN: Donnamarie Poag Primary Care Jameil Whitmoyer: Reeves Dam Other Clinician: Referring Alvis Pulcini: Reeves Dam Treating Elsi Stelzer/Extender: Yaakov Guthrie in Treatment: 18 Compression Therapy Performed for Wound Assessment: Wound #1 Left,Posterior Lower Leg Performed By: Clinician Donnamarie Poag, RN Compression Type: Three Layer Post Procedure Diagnosis Same as Pre-procedure Electronic Signature(s) Signed: 09/23/2020 3:15:27 PM By: Donnamarie Poag Entered By: Donnamarie Poag on 09/23/2020 14:23:58 Shane Bean ([id number]) -------------------------------------------------------------------------------- Encounter Discharge Information Details Patient Name: Shane Bean Date of Service: 09/23/2020 1:45 PM Medical Record Number: [id number] Patient Account Number: [id number] Date of Birth/Sex: Jun 29, 1955 (64 y.o. M) Treating RN: Donnamarie Poag Primary Care Osceola Holian: Reeves Dam Other Clinician: Referring Ashara Lounsbury: Reeves Dam Treating Jazelle Achey/Extender: Yaakov Guthrie in Treatment: 18 Encounter Discharge Information Items Discharge Condition: Stable Ambulatory Status: Ambulatory Discharge Destination: Home Transportation: Private Auto Accompanied By: self Schedule Follow-up Appointment: Yes Clinical Summary of Care: Electronic Signature(s) Signed: 09/23/2020 3:15:27 PM By: Donnamarie Poag Entered By: Donnamarie Poag on 09/23/2020 14:53:14 Shane Bean  ([id number]) -------------------------------------------------------------------------------- Lower Extremity Assessment Details Patient Name: Shane Bean Date of Service: 09/23/2020 1:45 PM Medical Record Number: [id number] Patient Account Number: [id number] Date of Birth/Sex: May 01, 1955 (64 y.o. M) Treating RN: Donnamarie Poag Primary Care Loveah Like: Reeves Dam Other Clinician: Referring Aldridge Krzyzanowski: Reeves Dam Treating Devinn Voshell/Extender: Yaakov Guthrie in Treatment: 18 Edema Assessment Assessed: [Left: Yes] [Right: No] Edema: [Left: N] [Right: o] Calf Left: Right: Point of Measurement: 34 cm From Medial Instep 40 cm Ankle Left: Right: Point of Measurement: 10 cm From Medial Instep 25 cm Vascular Assessment Pulses: Dorsalis Pedis Palpable: [Left:Yes] Electronic Signature(s) Signed: 09/23/2020 3:15:27 PM By: Donnamarie Poag Entered By: Donnamarie Poag on 09/23/2020 14:22:45 Shane Bean ([id number]) -------------------------------------------------------------------------------- Multi Wound Chart Details Patient Name: Shane Bean Date of Service: 09/23/2020 1:45 PM Medical Record Number: [id number] Patient Account Number: [id number] Date of Birth/Sex: 03/21/1955 (64 y.o. M) Treating RN: Donnamarie Poag Primary Care Amari Burnsworth: Reeves Dam Other Clinician: Referring Raiyah Speakman: Reeves Dam Treating Malita Ignasiak/Extender: Yaakov Guthrie in Treatment: 18 Vital Signs Height(in): 70 Pulse(bpm): 76 Weight(lbs): 290 Blood Pressure(mmHg): 127/61 Body Mass Index(BMI): 42 Temperature(F): 98.3 Respiratory Rate(breaths/min): 16 Photos: [N/A:N/A] Wound Location: Left, Posterior Lower Leg N/A N/A Wounding Event: Gradually Appeared N/A N/A Primary Etiology: Diabetic Wound/Ulcer of the Lower N/A N/A Extremity Comorbid History: Hypertension, Type II Diabetes, N/A N/A Neuropathy Date Acquired: 04/08/2020 N/A N/A Weeks of Treatment: 18 N/A N/A Wound Status: Open N/A  N/A Measurements L x W x D (cm) 1.1x0.7x0.1 N/A N/A Area (cm) : 0.605 N/A N/A Volume (cm) : 0.06 N/A N/A % Reduction in Area: 94.50% N/A N/A % Reduction in Volume: 94.50% N/A N/A Classification: Grade 2 N/A N/A Exudate Amount: Large N/A N/A Exudate Type: Serosanguineous N/A N/A Exudate Color: red, brown N/A N/A Granulation Amount: Large (67-100%) N/A N/A Granulation Quality: Red, Pink N/A N/A Necrotic Amount: Small (1-33%) N/A N/A Exposed Structures: Fat Layer (Subcutaneous Tissue): N/A N/A Yes Fascia: No Tendon: No Muscle: No Joint: No Bone: No Epithelialization: Medium (34-66%) N/A N/A Procedures Performed: Compression Therapy N/A N/A Treatment Notes Wound #1 (Lower Leg) Wound Laterality: Left, Posterior Cleanser Soap and Water Discharge Instruction: Gently cleanse wound with antibacterial soap, rinse and pat dry prior to dressing wounds Peri-Wound Care TYSHAN, ENDERLE ([id number]) Topical Primary Dressing Hydrofera Blue Ready Transfer Foam,  2.5x2.5 (in/in) Discharge Instruction: Apply Hydrofera Blue Ready to wound bed as directed Secondary Dressing ABD Pad 5x9 (in/in) Discharge Instruction: Cover with ABD pad Secured With Compression Wrap Profore Lite LF 3 Multilayer Compression Bandaging System Discharge Instruction: Apply 3 multi-layer wrap as prescribed. Compression Stockings Circaid Juxta Lite Compression Wrap Quantity: 1 Left Leg Compression Amount: 20-30 mmHg Discharge Instruction: Apply Circaid Juxta Lite Compression Wrap as directed Add-Ons Electronic Signature(s) Signed: 09/23/2020 3:11:09 PM By: Kalman Shan DO Entered By: Kalman Shan on 09/23/2020 15:07:45 Shane Bean ([id number]) -------------------------------------------------------------------------------- Multi-Disciplinary Care Plan Details Patient Name: Shane Bean Date of Service: 09/23/2020 1:45 PM Medical Record Number: [id number] Patient Account Number:  [id number] Date of Birth/Sex: 11-Mar-1955 (64 y.o. M) Treating RN: Donnamarie Poag Primary Care Channin Agustin: Reeves Dam Other Clinician: Referring Camia Dipinto: Reeves Dam Treating Joelyn Lover/Extender: Yaakov Guthrie in Treatment: 18 Active Inactive Electronic Signature(s) Signed: 09/23/2020 3:15:27 PM By: Donnamarie Poag Entered By: Donnamarie Poag on 09/23/2020 14:23:12 Shane Bean ([id number]) -------------------------------------------------------------------------------- Pain Assessment Details Patient Name: Shane Bean Date of Service: 09/23/2020 1:45 PM Medical Record Number: [id number] Patient Account Number: [id number] Date of Birth/Sex: 31-Aug-1955 (64 y.o. M) Treating RN: Donnamarie Poag Primary Care Jolaine Fryberger: Reeves Dam Other Clinician: Referring Jocee Kissick: Reeves Dam Treating Whitni Pasquini/Extender: Yaakov Guthrie in Treatment: 18 Active Problems Location of Pain Severity and Description of Pain Patient Has Paino Yes Site Locations Pain Location: Generalized Pain, Pain in Ulcers Rate the pain. Current Pain Level: 5 Pain Management and Medication Current Pain Management: Electronic Signature(s) Signed: 09/23/2020 3:15:27 PM By: Donnamarie Poag Entered By: Donnamarie Poag on 09/23/2020 14:17:29 Shane Bean ([id number]) -------------------------------------------------------------------------------- Patient/Caregiver Education Details Patient Name: Shane Bean Date of Service: 09/23/2020 1:45 PM Medical Record Number: [id number] Patient Account Number: [id number] Date of Birth/Gender: 1955/06/26 (64 y.o. M) Treating RN: Donnamarie Poag Primary Care Physician: Reeves Dam Other Clinician: Referring Physician: Reeves Dam Treating Physician/Extender: Yaakov Guthrie in Treatment: 68 Education Assessment Education Provided To: Patient Education Topics Provided Wound/Skin Impairment: Electronic Signature(s) Signed: 09/23/2020 3:15:27 PM By:  Donnamarie Poag Entered By: Donnamarie Poag on 09/23/2020 14:51:47 Shane Bean ([id number]) -------------------------------------------------------------------------------- Wound Assessment Details Patient Name: Shane Bean Date of Service: 09/23/2020 1:45 PM Medical Record Number: [id number] Patient Account Number: [id number] Date of Birth/Sex: [birth year] (64 y.o. M) Treating RN: Donnamarie Poag Primary Care Neldon Shepard: Reeves Dam Other Clinician: Referring Mikhai Bienvenue: Reeves Dam Treating Jessy Cybulski/Extender: Yaakov Guthrie in Treatment: 18 Wound Status Wound Number: 1 Primary Etiology: Diabetic Wound/Ulcer of the Lower Extremity Wound Location: Left, Posterior Lower Leg Wound Status: Open Wounding Event: Gradually Appeared Comorbid History: Hypertension, Type II Diabetes, Neuropathy Date Acquired: 04/08/2020 Weeks Of Treatment: 18 Clustered Wound: No Photos Wound Measurements Length: (cm) 1.1 Width: (cm) 0.7 Depth: (cm) 0.1 Area: (cm) 0.605 Volume: (cm) 0.06 % Reduction in Area: 94.5% % Reduction in Volume: 94.5% Epithelialization: Medium (34-66%) Tunneling: No Undermining: No Wound Description Classification: Grade 2 Exudate Amount: Large Exudate Type: Serosanguineous Exudate Color: red, brown Foul Odor After Cleansing: No Slough/Fibrino Yes Wound Bed Granulation Amount: Large (67-100%) Exposed Structure Granulation Quality: Red, Pink Fascia Exposed: No Necrotic Amount: Small (1-33%) Fat Layer (Subcutaneous Tissue) Exposed: Yes Necrotic Quality: Adherent Slough Tendon Exposed: No Muscle Exposed: No Joint Exposed: No Bone Exposed: No Treatment Notes Wound #1 (Lower Leg) Wound Laterality: Left, Posterior Cleanser Soap and Water Discharge Instruction: Gently cleanse wound with antibacterial soap, rinse and pat dry prior to dressing wounds Peri-Wound Care JAX, KENTNER ([id number]) Topical Primary Dressing Hydrofera Blue Ready  Transfer Foam,  2.5x2.5 (in/in) Discharge Instruction: Apply Hydrofera Blue Ready to wound bed as directed Secondary Dressing ABD Pad 5x9 (in/in) Discharge Instruction: Cover with ABD pad Secured With Compression Wrap Profore Lite LF 3 Multilayer Compression [place] Discharge Instruction: Apply 3 multi-layer wrap as prescribed. Compression Stockings Circaid Juxta Lite Compression Wrap Quantity: 1 Left Leg Compression Amount: 20-30 mmHg Discharge Instruction: Apply Circaid Juxta Lite Compression Wrap as directed Add-Ons Electronic Signature(s) Signed: 09/23/2020 3:15:27 PM By: Donnamarie Poag Entered By: Donnamarie Poag on 09/23/2020 14:21:54 Shane Bean ([id number]) -------------------------------------------------------------------------------- [place] Details Patient Name: Shane Bean Date of Service: 09/23/2020 1:45 PM Medical Record Number: [id number] Patient Account Number: [id number] Date of Birth/Sex: December 02, 1955 (64 y.o. M) Treating RN: Donnamarie Poag Primary Care Rayanna Matusik: Reeves Dam Other Clinician: Referring Rigdon Macomber: Reeves Dam Treating Wilburt Messina/Extender: Yaakov Guthrie in Treatment: 18 Vital Signs Time Taken: 14:17 Temperature (F): 98.3 Height (in): 70 Pulse (bpm): 68 Weight (lbs): 290 Respiratory Rate (breaths/min): 16 Body Mass Index (BMI): 41.6 Blood Pressure (mmHg): 127/61 Reference Range: 80 - 120 mg / dl Electronic Signature(s) Signed: 09/23/2020 3:15:27 PM By: Donnamarie Poag Entered ByDonnamarie Poag on 09/23/2020 14:17:06

## 2020-09-28 ENCOUNTER — Emergency Department: Payer: Medicare (Managed Care)

## 2020-09-28 ENCOUNTER — Emergency Department
Admission: EM | Admit: 2020-09-28 | Discharge: 2020-09-28 | Disposition: A | Discharge status: Home / Self Care | Payer: Medicare (Managed Care) | Attending: Emergency Medicine | Admitting: Emergency Medicine

## 2020-09-28 ENCOUNTER — Other Ambulatory Visit: Payer: Self-pay

## 2020-09-28 DIAGNOSIS — I10 Essential (primary) hypertension: Secondary | ICD-10-CM | POA: Insufficient documentation

## 2020-09-28 DIAGNOSIS — Z85038 Personal history of other malignant neoplasm of large intestine: Secondary | ICD-10-CM | POA: Insufficient documentation

## 2020-09-28 DIAGNOSIS — Z79899 Other long term (current) drug therapy: Secondary | ICD-10-CM | POA: Diagnosis not present

## 2020-09-28 DIAGNOSIS — R509 Fever, unspecified: Secondary | ICD-10-CM | POA: Diagnosis not present

## 2020-09-28 DIAGNOSIS — Z7982 Long term (current) use of aspirin: Secondary | ICD-10-CM | POA: Insufficient documentation

## 2020-09-28 DIAGNOSIS — Z7901 Long term (current) use of anticoagulants: Secondary | ICD-10-CM | POA: Insufficient documentation

## 2020-09-28 DIAGNOSIS — R300 Dysuria: Secondary | ICD-10-CM | POA: Insufficient documentation

## 2020-09-28 DIAGNOSIS — R0981 Nasal congestion: Secondary | ICD-10-CM | POA: Diagnosis not present

## 2020-09-28 DIAGNOSIS — N3090 Cystitis, unspecified without hematuria: Secondary | ICD-10-CM

## 2020-09-28 DIAGNOSIS — E119 Type 2 diabetes mellitus without complications: Secondary | ICD-10-CM | POA: Diagnosis not present

## 2020-09-28 DIAGNOSIS — Z7984 Long term (current) use of oral hypoglycemic drugs: Secondary | ICD-10-CM | POA: Diagnosis not present

## 2020-09-28 LAB — CBC
HCT: 32.2 % — ABNORMAL LOW (ref 39.0–52.0)
Hemoglobin: 10.3 g/dL — ABNORMAL LOW (ref 13.0–17.0)
MCH: 24.3 pg — ABNORMAL LOW (ref 26.0–34.0)
MCHC: 32 g/dL (ref 30.0–36.0)
MCV: 76.1 fL — ABNORMAL LOW (ref 80.0–100.0)
Platelets: 174 10*3/uL (ref 150–400)
RBC: 4.23 MIL/uL (ref 4.22–5.81)
RDW: 18 % — ABNORMAL HIGH (ref 11.5–15.5)
WBC: 9.3 10*3/uL (ref 4.0–10.5)
nRBC: 0 % (ref 0.0–0.2)

## 2020-09-28 LAB — URINALYSIS, COMPLETE (UACMP) WITH MICROSCOPIC
Bilirubin Urine: NEGATIVE
Glucose, UA: >=500 mg/dL — AB
Ketones, ur: 20 mg/dL — AB
Nitrite: NEGATIVE
Protein, ur: 30 mg/dL — AB
Specific Gravity, Urine: 1.018 (ref 1.005–1.030)
WBC, UA: >50 WBC/hpf — ABNORMAL HIGH (ref 0–5)
pH: 5 (ref 5.0–8.0)

## 2020-09-28 LAB — BASIC METABOLIC PANEL
Anion gap: 8 (ref 5–15)
BUN: 16 mg/dL (ref 8–23)
CO2: 27 mmol/L (ref 22–32)
Calcium: 8.6 mg/dL — ABNORMAL LOW (ref 8.9–10.3)
Chloride: 98 mmol/L (ref 98–111)
Creatinine, Ser: 1.27 mg/dL — ABNORMAL HIGH (ref 0.61–1.24)
GFR, Estimated: >60 mL/min (ref >60)
Glucose, Bld: 272 mg/dL — ABNORMAL HIGH (ref 70–99)
Potassium: 3.7 mmol/L (ref 3.5–5.1)
Sodium: 133 mmol/L — ABNORMAL LOW (ref 135–145)

## 2020-09-28 LAB — LACTIC ACID, PLASMA
Lactic Acid, Venous: 2.2 mmol/L (ref 0.5–1.9)
Lactic Acid, Venous: 1.5 mmol/L (ref 0.5–1.9)

## 2020-09-28 MED ORDER — SODIUM CHLORIDE 0.9 % IV SOLN
1.0000 g | Freq: Once | INTRAVENOUS | Status: AC
  Administered 2020-09-28: 1 g via INTRAVENOUS
  Filled 2020-09-28: qty 10

## 2020-09-28 MED ORDER — CEPHALEXIN 500 MG PO CAPS: 500.0000 mg | ORAL_CAPSULE | Freq: Two times a day (BID) | ORAL | 0 refills | Status: DC

## 2020-09-28 MED ORDER — CEFDINIR 300 MG PO CAPS: 300.0000 mg | ORAL_CAPSULE | Freq: Two times a day (BID) | ORAL | 0 refills | Status: AC

## 2020-09-28 MED ORDER — SODIUM CHLORIDE 0.9 % IV BOLUS
1000.0000 mL | Freq: Once | INTRAVENOUS | Status: AC
  Administered 2020-09-28: 1000 mL via INTRAVENOUS

## 2020-09-28 NOTE — Discharge Instructions (Addendum)
Take the antibiotic as prescribed and finish the full 10-day course.  Take Tylenol 1000 mg every 6 hours for the next 24 hours, then as needed for fever.  Return to the ER for new, worsening, or persistent pain, fever, weakness, vomiting or if you cannot take the antibiotic, or any other new or worsening symptoms that concern you.  Follow-up with your doctor in 1 week.

## 2020-09-28 NOTE — ED Provider Notes (Signed)
[facility] Emergency Department Provider Note ____________________________________________   Event Date/Time   First MD Initiated Contact with Patient 09/28/20 1315     (approximate)  I have reviewed the triage vital signs and the nursing notes.   HISTORY  Chief Complaint No chief complaint on file.    HPI Shane Bean is a 65 y.o. male with PMH as noted below including diabetes, hyperlipidemia, hypertension, aortic dissection, and DVT who presents for several complaints, primarily sinus and nasal congestion, low-grade temperature, and dysuria.  The patient has had some chronic problems with sinus congestion and feels like his congestion is slightly worse in the last few days.  He denies cough or shortness of breath but occasionally spits up some whitish mucus.  He reports associated low-grade fever as high as 100.1 at home.  He denies any associated vomiting, diarrhea, or chest pain.  The patient also reports dysuria which he describes as a discomfort primarily when he is urinating and radiates from the tip of his penis towards his groin.  He denies any actual testicular pain, and has no abdominal or flank pain.  He denies hematuria.   Past Medical History:  Diagnosis Date   Arthritis    Ascending aortic dissection (HCC)    Diabetes mellitus without complication (HCC)    DVT (deep venous thrombosis) (HCC)    legs and lungs    Hyperlipemia    Hypertension    Kidney stones     Patient Active Problem List   Diagnosis Date Noted   Personal history of colon cancer    Hydrocele 02/26/2018   Colon polyp 08/17/2017   Anemia    Polyp of sigmoid colon    Benign neoplasm of descending colon    Colon neoplasm    Abdominal pain, epigastric    Gastritis without bleeding    Nephrolithiasis 11/06/2015   Cough with sputum 01/14/2015   Fatigue 01/14/2015   Obesity 01/14/2015   GERD (gastroesophageal reflux disease) 04/24/2014   Long term current use  of anticoagulant therapy 04/24/2014   Type 2 diabetes mellitus with complication ([place]) 22/97/9892   Ascending aortic dissection ([place]) 10/04/2013   HTN (hypertension) 05/28/2013   Pulmonary emboli ([place]) 06/26/2012   Pulmonary edema 06/23/2012   Respiratory failure with hypoxia ([place]) 06/23/2012   Osteoarthritis of left hip 04/19/2012   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    Past Surgical History:  Procedure Laterality Date   CARDIAC SURGERY     COLONOSCOPY WITH PROPOFOL N/A 04/04/2017   Procedure: COLONOSCOPY WITH PROPOFOL;  Surgeon: Lucilla Lame, MD;  Location: [facility] ENDOSCOPY;  Service: Endoscopy;  Laterality: N/A;   COLONOSCOPY WITH PROPOFOL N/A 05/21/2019   Procedure: COLONOSCOPY WITH PROPOFOL;  Surgeon: Lucilla Lame, MD;  Location: [facility] ENDOSCOPY;  Service: Endoscopy;  Laterality: N/A;   DENTAL SURGERY     ESOPHAGOGASTRODUODENOSCOPY (EGD) WITH PROPOFOL N/A 04/04/2017   Procedure: ESOPHAGOGASTRODUODENOSCOPY (EGD) WITH PROPOFOL;  Surgeon: Lucilla Lame, MD;  Location: [facility] ENDOSCOPY;  Service: Endoscopy;  Laterality: N/A;   LAPAROSCOPIC RIGHT HEMI COLECTOMY Right 08/17/2017   Procedure: LAPAROSCOPIC  RIGHT HEMI COLECTOMY, COLONOSCOPY, UMBILICAL HERNIA REPAIR;  Surgeon: Ileana Roup, MD;  Location: [facility] ORS;  Service: General;  Laterality: Right;   REPAIR THORACIC AORTA      Prior to Admission medications   Medication Sig Start Date End Date Taking? Authorizing Provider  allopurinol (ZYLOPRIM) 100 MG tablet Take 100 mg by mouth daily at 3 pm.    [provider]  amLODipine (  NORVASC) 10 MG tablet Take 10 mg by mouth at bedtime.    [provider]  aspirin EC 325 MG tablet Take 162.5 mg by mouth daily.  10/15/13   [provider]  COVID-19 mRNA vaccine, Moderna, 100 MCG/0.5ML injection USE AS DIRECTED 11/01/19 10/31/20  Carlyle Basques, MD  doxazosin (CARDURA) 8 MG tablet Take 8 mg by mouth at bedtime.     [provider]  glipiZIDE (GLUCOTROL) 5 MG tablet  Take 10 mg by mouth 2 (two) times daily.    [provider]  HYDROcodone-acetaminophen (NORCO) 5-325 MG tablet Take 1 tablet by mouth every 4 (four) hours as needed for moderate pain. 02/28/18   Dustin Flock, MD  metoprolol tartrate (LOPRESSOR) 100 MG tablet Take 100 mg by mouth 2 (two) times daily.    [provider]  ranitidine (ZANTAC) 150 MG capsule Take 150 mg by mouth daily at 3 pm.  10/15/13   [provider]  simvastatin (ZOCOR) 10 MG tablet Take 10 mg by mouth every evening.    [provider]  spironolactone (ALDACTONE) 25 MG tablet Take 25 mg by mouth at bedtime.    [provider]  sulfamethoxazole-trimethoprim (BACTRIM DS) 800-160 MG tablet Take 1 tablet by mouth 2 (two) times daily. 05/10/20   Menshew, Dannielle Karvonen, PA-C  warfarin (COUMADIN) 6 MG tablet Take 6 mg by mouth every evening.    [provider]    Allergies Chlorthalidone, Hydralazine, and Lisinopril  No family history on file.  Social History Social History   Tobacco Use   Smoking status: Never   Smokeless tobacco: Never  Vaping Use   Vaping Use: Never used  Substance Use Topics   Alcohol use: Yes    Alcohol/week: 12.0 standard drinks    Types: 12 Cans of beer per week    Comment: none olast 24hrs   Drug use: No    Review of Systems  Constitutional: Positive for low-grade fever. Eyes: No visual changes. ENT: No sore throat. Cardiovascular: Denies chest pain. Respiratory: Denies shortness of breath. Gastrointestinal: No vomiting or diarrhea.  Genitourinary: Positive for dysuria.  Musculoskeletal: Negative for back pain. Skin: Negative for rash. Neurological: Negative for headache.   ____________________________________________   PHYSICAL EXAM:  VITAL SIGNS: ED Triage Vitals  Enc Vitals Group     BP 09/28/20 1148 109/78     Pulse Rate 09/28/20 1148 78     Resp 09/28/20 1148 20     Temp 09/28/20 1148 99.7 F (37.6 C)     Temp  Source 09/28/20 1148 Oral     SpO2 09/28/20 1148 95 %     Weight 09/28/20 1148 280 lb (127 kg)     Height 09/28/20 1148 5\' 10"  (1.778 m)     Head Circumference --      Peak Flow --      Pain Score 09/28/20 1158 0     Pain Loc --      Pain Edu? --      Excl. in [place]? --     Constitutional: Alert and oriented. Well appearing and in no acute distress. Eyes: Conjunctivae are normal.  Head: Atraumatic. Nose: No congestion/rhinnorhea. Mouth/Throat: Mucous membranes are moist.  Oropharynx clear. Neck: Normal range of motion.  Cardiovascular: Normal rate, regular rhythm. Good peripheral circulation. Respiratory: Normal respiratory effort.  No retractions.  Gastrointestinal: Soft and nontender. No distention.  Genitourinary: No flank or CVA tenderness. Musculoskeletal: Extremities warm and well perfused.  Neurologic:  Normal  speech and language. No gross focal neurologic deficits are appreciated.  Skin:  Skin is warm and dry. No rash noted. Psychiatric: Mood and affect are normal. Speech and behavior are normal.  ____________________________________________   LABS (all labs ordered are listed, but only abnormal results are displayed)  Labs Reviewed  BASIC METABOLIC PANEL - Abnormal; Notable for the following components:      Result Value   Sodium 133 (*)    Glucose, Bld 272 (*)    Creatinine, Ser 1.27 (*)    Calcium 8.6 (*)    All other components within normal limits  CBC - Abnormal; Notable for the following components:   Hemoglobin 10.3 (*)    HCT 32.2 (*)    MCV 76.1 (*)    MCH 24.3 (*)    RDW 18.0 (*)    All other components within normal limits  URINALYSIS, COMPLETE (UACMP) WITH MICROSCOPIC - Abnormal; Notable for the following components:   Color, Urine YELLOW (*)    APPearance HAZY (*)    Glucose, UA >=500 (*)    Hgb urine dipstick MODERATE (*)    Ketones, ur 20 (*)    Protein, ur 30 (*)    Leukocytes,Ua MODERATE (*)    WBC, UA >50 (*)    Bacteria, UA FEW (*)     All other components within normal limits   ____________________________________________  EKG    ____________________________________________  RADIOLOGY  Chest x-ray interpreted by me shows no focal consolidation or edema CT abdomen/pelvis:  IMPRESSION:  1. Bladder wall thickening and surrounding haziness can be seen with  cystitis/UTI.  2. Left renal stone.  No obstruction.  3. Hepatomegaly.  4. Aortic atherosclerosis (ICD10-I70.0). Coronary artery  calcification.    ____________________________________________   PROCEDURES  Procedure(s) performed: No  Procedures  Critical Care performed: No ____________________________________________   INITIAL IMPRESSION / ASSESSMENT AND PLAN / ED COURSE  Pertinent labs & imaging results that were available during my care of the patient were reviewed by me and considered in my medical decision making (see chart for details).   65 year old male with PMH as noted below including diabetes, hyperlipidemia, hypertension, aortic dissection, and DVT presents with some nasal congestion and low-grade fever as well as dysuria over the last several days.  I reviewed the past medical records in [place].  The patient was most recently seen in the ED on 5/8 with a chronic left lower extremity wound and has been followed by wound care since then.  On exam, he is overall well-appearing.  His vital signs are normal except for minimally elevated temperature.  The abdomen is soft and nontender.  Oropharynx is clear.  Exam is otherwise unremarkable.  Urinalysis obtained from triage shows significant WBCs and other findings suggestive of possible UTI.  The lab work-up is otherwise reassuring, with no leukocytosis.  ----------------------------------------- 3:24 PM on 09/28/2020 -----------------------------------------  Initial lactate is minimally elevated, however the patient clinically has no evidence of sepsis, with normal vital signs and no  leukocytosis.  He appears well.  CT confirms cystitis, with no other acute abnormalities.  This is consistent with the urinalysis.  The patient appears well, and he expresses a strong preference to go home if possible.  Given that there are no clinical findings of sepsis and the labs are overall reassuring, this is reasonable.  We will give a dose of ceftriaxone here, give fluids, and recheck the lactate.  The patient continues to appear well and the lactate clears appropriately, he would be  stable for discharge home.  I have signed him out to the oncoming ED physician Dr. Ellender Hose.   ____________________________________________   FINAL CLINICAL IMPRESSION(S) / ED DIAGNOSES  Final diagnoses:  None      NEW MEDICATIONS STARTED DURING THIS VISIT:  New Prescriptions   No medications on file     Note:  This document was prepared using Dragon voice recognition software and may include unintentional dictation errors.    Arta Silence, MD 09/28/20 1526

## 2020-09-28 NOTE — ED Triage Notes (Signed)
Pt presents to ED from home C/O chills, sinus pressure, generalized body aches since Friday. Pt also reports pain from groin to tip of penis, "like a kidney stone is trying to pass."

## 2020-09-30 ENCOUNTER — Encounter (HOSPITAL_BASED_OUTPATIENT_CLINIC_OR_DEPARTMENT_OTHER): Payer: Medicare (Managed Care) | Admitting: Internal Medicine

## 2020-09-30 ENCOUNTER — Other Ambulatory Visit: Payer: Self-pay

## 2020-09-30 DIAGNOSIS — I872 Venous insufficiency (chronic) (peripheral): Secondary | ICD-10-CM | POA: Diagnosis not present

## 2020-09-30 DIAGNOSIS — E11622 Type 2 diabetes mellitus with other skin ulcer: Secondary | ICD-10-CM

## 2020-09-30 DIAGNOSIS — I87331 Chronic venous hypertension (idiopathic) with ulcer and inflammation of right lower extremity: Secondary | ICD-10-CM

## 2020-09-30 DIAGNOSIS — L97822 Non-pressure chronic ulcer of other part of left lower leg with fat layer exposed: Secondary | ICD-10-CM | POA: Diagnosis not present

## 2020-09-30 NOTE — Progress Notes (Signed)
Shane, Bean ([id number]) Visit Report for 09/30/2020 Chief Complaint Document Details Patient Name: Shane Bean, Shane Bean Date of Service: 09/30/2020 1:45 PM Medical Record Number: [id number] Patient Account Number: [id number] Date of Birth/Sex: 1955/12/26 (65 y.o. M) Treating RN: Donnamarie Poag Primary Care Provider: Reeves Dam Other Clinician: Referring Provider: Reeves Dam Treating Provider/Extender: Yaakov Guthrie in Treatment: 19 Information Obtained from: Patient Chief Complaint Left LE Ulcer Electronic Signature(s) Signed: 09/30/2020 2:23:56 PM By: Kalman Shan DO Entered By: Kalman Shan on 09/30/2020 14:21:21 Shane Bean ([id number]) -------------------------------------------------------------------------------- HPI Details Patient Name: Shane Bean Date of Service: 09/30/2020 1:45 PM Medical Record Number: [id number] Patient Account Number: [id number] Date of Birth/Sex: Jun 14, 1955 (65 y.o. M) Treating RN: Donnamarie Poag Primary Care Provider: Reeves Dam Other Clinician: Referring Provider: Reeves Dam Treating Provider/Extender: Yaakov Guthrie in Treatment: 19 History of Present Illness HPI Description: 05/18/2020 upon evaluation today patient appears for initial evaluation here in clinic concerning issues that he has been having with the wound on his left lateral leg. Fortunately there does not appear to be any signs of obvious an active infection at this time which is great news. With that being said the patient unfortunately is continued to have issues with pain although he tells me it is gotten a lot better. He was on Keflex as well as Bactrim DS which seems to have done a good job there. His most recent hemoglobin A1c was 9.8 that was on 04/14/2020. Subsequently I do feel like that the patient is making progress here. He does have a history of chronic venous insufficiency as well as hypertension. I do not see any need for  antibiotics at this point. 5/25; follow-up of the wound on the left posterior calf. This is completely necrotic on the surface. It does not look infected but it is painful. He is a diabetic but I think there is some suggestion here of chronic venous disease as well. We used Iodoflex under compression last week 6/1; again a completely necrotic surface on this with the wound. We have been using Iodoflex under compression he complains of gnawing pain. He has had wounds previously a lot of this looks like chronic venous disease. 6/8; about two thirds of the surface of this wound is still covered in a black necrotic eschar. I changed him from Iodoflex to [place] last week because of complaints of pain. He actually was seen by her neurologist this weeko Peripheral neuropathy as a cause of the pain and they changed him from Neurontin to Lyrica but he still has not had any relief. He says that most of the pain however is in his heel not in his wound per se. He is a diabetic 6/15. This is a patient with what looks to be a venous wound on the left lateral lower leg. He is also a diabetic. Currently being worked up for diabetic neuropathy. He complains of pain out of proportion to the size of the wound although to be fair the entire area here was eschared. We have been working to get a viable surface. We are using Sorbact 6/22; fairly painful wound on the left posterior calf. I am assuming this is been venous he is also a diabetic. His ABI in our clinic was noncompressible however he has easily palpable pulses on his feet. He complains of a lot of pain in the wound also of the left heel and the upper left calf. Some of this may be neuropathy. It is led me to discontinue Iodoflex reduce  his compression but he still complains of pain in fact he says he could not take a debridement today. When I first saw this it was completely necrotic surface we have got it down to something that looks a reasonably healthy I  have been wondering about biopsying this area however he is on Coumadin and with the pain I have put this off. The major question would be an inflammatory ulcer such as pyoderma. The patient is not aware of how this started. He does however have a wound history on both legs. 6/29; patient presents for 1 week follow-up. He has been using Hydrofera Blue under Kerlix/Coban. He has tolerated this well. He currently denies signs of infection. He also declines debridement today. He he states it is painful and does not want to have this done. 7/6; Patient presents for 1 week follow-up. He has been using Hydrofera Blue under Kerlix/Coban. He denies signs of infection. He declines debridement today. 7/15; patient presents for 1 week follow-up. He has been using Hydrofera Blue under Kerlix/Coban. He denies signs of infection today. He is agreeable to having debridement done today. 7/20; patient presents for 1 week follow-up. He is in good spirits today. He has been using Hydrofera Blue under Kerlix/Coban. He denies signs of infection today. 7/27; patient presents for 1 week follow-up. He is in good spirits today. He has no issues or complaints today. He denies signs of infection. 08/12/2020 upon evaluation today patient appears to be doing better in regard to his wound. He has been tolerating the dressing changes without complication. With that being said he did have arterial studies and does show that he has a TBI on the right of 0.90 and a TBI on the left of 0.76 which is excellent. There is no evidence of arterial compromise at this point. 8/17; patient presents for 1 week follow-up. He has no issues or complaints today. He denies signs of infection. 8/24; patient presents for 1 week follow-up. He has no issues or complaints today. He denies signs of infection. He continues to tolerate the compression wrap well. 8/31; patient presents for 1 week follow-up. He has no issues or complaints today. He denies  signs of infection. He is in good spirits today. 9/7; patient presents for 1 week follow-up. He is tolerated the compression wrap well. He has no issues or complaints today. He denies signs of infection. 9/14; patient presents for 1 week follow-up. He has no issues or complaints today. He denies signs of infection. He is in excellent spirits today. 9/21; patient presents for 1 week follow-up. He has no issues or complaints today. 9/28; patient presents for 1 week follow-up. He has tolerated the compression wrap well. He has no issues or complaints today. Electronic Signature(s) JAKEEM, GRAPE ([id number]) Signed: 09/30/2020 2:23:56 PM By: Kalman Shan DO Entered By: Kalman Shan on 09/30/2020 14:21:44 Shane Bean ([id number]) -------------------------------------------------------------------------------- Physical Exam Details Patient Name: Shane Bean Date of Service: 09/30/2020 1:45 PM Medical Record Number: [id number] Patient Account Number: [id number] Date of Birth/Sex: Jul 13, 1955 (65 y.o. M) Treating RN: Donnamarie Poag Primary Care Provider: Reeves Dam Other Clinician: Referring Provider: Reeves Dam Treating Provider/Extender: Yaakov Guthrie in Treatment: 75 Constitutional . Cardiovascular . Psychiatric . Notes Left lower extremity: To the posterior aspect there is an open wound with granulation tissue present. Appears well-healing. No signs of infection. Good edema control. Electronic Signature(s) Signed: 09/30/2020 2:23:56 PM By: Kalman Shan DO Entered By: Kalman Shan on 09/30/2020 14:22:12 Shane Bean ([id number]) --------------------------------------------------------------------------------  Physician Orders Details Patient Name: NAHSIR, VENEZIA Date of Service: 09/30/2020 1:45 PM Medical Record Number: [id number] Patient Account Number: [id number] Date of Birth/Sex: 01-31-1955 (65 y.o. M) Treating RN: Donnamarie Poag Primary Care Provider: Reeves Dam Other Clinician: Referring Provider: Reeves Dam Treating Provider/Extender: Yaakov Guthrie in Treatment: 46 Verbal / Phone Orders: No Diagnosis Coding Follow-up Appointments o Return Appointment in 1 week. o Nurse Visit as needed Bathing/ Shower/ Hygiene Wound #1 Left,Posterior Lower Leg o May shower with wound dressing protected with water repellent cover or cast protector. - cover dressing during shower-do not get it wet o No tub bath. Edema Control - Lymphedema / Segmental Compressive Device / Other o Optional: One layer of unna paste to top of compression wrap (to act as an anchor). o Elevate, Exercise Daily and Avoid Standing for Long Periods of Time. o Elevate legs to the level of the heart and pump ankles as often as possible o Elevate leg(s) parallel to the floor when sitting. o DO YOUR BEST to sleep in the bed at night. DO NOT sleep in your recliner. Long hours of sitting in a recliner leads to swelling of the legs and/or potential wounds on your backside. Additional Orders / Instructions o Follow Nutritious Diet and Increase Protein Intake Wound Treatment Wound #1 - Lower Leg Wound Laterality: Left, Posterior Cleanser: Soap and Water 1 x Per Week/30 Days Discharge Instructions: Gently cleanse wound with antibacterial soap, rinse and pat dry prior to dressing wounds Primary Dressing: Hydrofera Blue Ready Transfer Foam, 2.5x2.5 (in/in) 1 x Per Week/30 Days Discharge Instructions: Apply Hydrofera Blue Ready to wound bed as directed Secondary Dressing: Gauze 1 x Per Week/30 Days Compression Wrap: Profore Lite LF 3 Multilayer Compression Bandaging System 1 x Per Week/30 Days Discharge Instructions: Apply 3 multi-layer wrap as prescribed. Compression Stockings: Circaid Juxta Lite Compression Wrap Left Leg Compression Amount: 20-30 mmHG Discharge Instructions: Apply Circaid Juxta Lite Compression Wrap as  directed Electronic Signature(s) Signed: 09/30/2020 2:23:56 PM By: Kalman Shan DO Signed: 09/30/2020 3:51:14 PM By: Donnamarie Poag Entered By: Donnamarie Poag on 09/30/2020 14:10:35 Shane Bean ([id number]) -------------------------------------------------------------------------------- Problem List Details Patient Name: Shane Bean Date of Service: 09/30/2020 1:45 PM Medical Record Number: [id number] Patient Account Number: [id number] Date of Birth/Sex: 1955-08-07 (65 y.o. M) Treating RN: Donnamarie Poag Primary Care Provider: Reeves Dam Other Clinician: Referring Provider: Reeves Dam Treating Provider/Extender: Yaakov Guthrie in Treatment: 19 Active Problems ICD-10 Encounter Code Description Active Date MDM Diagnosis E11.622 Type 2 diabetes mellitus with other skin ulcer 05/18/2020 No Yes I87.2 Venous insufficiency (chronic) (peripheral) 05/18/2020 No Yes 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 layer 05/18/2020 No Yes exposed [place] (primary) hypertension 05/18/2020 No Yes I87.331 Chronic venous hypertension (idiopathic) with ulcer and inflammation of 08/26/2020 No Yes right lower extremity Inactive Problems Resolved Problems Electronic Signature(s) Signed: 09/30/2020 2:23:56 PM By: Kalman Shan DO Entered By: Kalman Shan on 09/30/2020 14:21:07 Shane Bean ([id number]) -------------------------------------------------------------------------------- Progress Note Details Patient Name: Shane Bean Date of Service: 09/30/2020 1:45 PM Medical Record Number: [id number] Patient Account Number: [id number] Date of Birth/Sex: 1955-10-27 (65 y.o. M) Treating RN: Donnamarie Poag Primary Care Provider: Reeves Dam Other Clinician: Referring Provider: Reeves Dam Treating Provider/Extender: Yaakov Guthrie in Treatment: 19 Subjective Chief Complaint Information obtained from Patient Left LE  Ulcer History of Present Illness (HPI) 05/18/2020 upon evaluation today patient appears for initial evaluation here in clinic concerning issues that he has been  having with the wound on his left lateral leg. Fortunately there does not appear to be any signs of obvious an active infection at this time which is great news. With that being said the patient unfortunately is continued to have issues with pain although he tells me it is gotten a lot better. He was on Keflex as well as Bactrim DS which seems to have done a good job there. His most recent hemoglobin A1c was 9.8 that was on 04/14/2020. Subsequently I do feel like that the patient is making progress here. He does have a history of chronic venous insufficiency as well as hypertension. I do not see any need for antibiotics at this point. 5/25; follow-up of the wound on the left posterior calf. This is completely necrotic on the surface. It does not look infected but it is painful. He is a diabetic but I think there is some suggestion here of chronic venous disease as well. We used Iodoflex under compression last week 6/1; again a completely necrotic surface on this with the wound. We have been using Iodoflex under compression he complains of gnawing pain. He has had wounds previously a lot of this looks like chronic venous disease. 6/8; about two thirds of the surface of this wound is still covered in a black necrotic eschar. I changed him from Iodoflex to [place] last week because of complaints of pain. He actually was seen by her neurologist this weeko Peripheral neuropathy as a cause of the pain and they changed him from Neurontin to Lyrica but he still has not had any relief. He says that most of the pain however is in his heel not in his wound per se. He is a diabetic 6/15. This is a patient with what looks to be a venous wound on the left lateral lower leg. He is also a diabetic. Currently being worked up for diabetic neuropathy. He  complains of pain out of proportion to the size of the wound although to be fair the entire area here was eschared. We have been working to get a viable surface. We are using Sorbact 6/22; fairly painful wound on the left posterior calf. I am assuming this is been venous he is also a diabetic. His ABI in our clinic was noncompressible however he has easily palpable pulses on his feet. He complains of a lot of pain in the wound also of the left heel and the upper left calf. Some of this may be neuropathy. It is led me to discontinue Iodoflex reduce his compression but he still complains of pain in fact he says he could not take a debridement today. When I first saw this it was completely necrotic surface we have got it down to something that looks a reasonably healthy I have been wondering about biopsying this area however he is on Coumadin and with the pain I have put this off. The major question would be an inflammatory ulcer such as pyoderma. The patient is not aware of how this started. He does however have a wound history on both legs. 6/29; patient presents for 1 week follow-up. He has been using Hydrofera Blue under Kerlix/Coban. He has tolerated this well. He currently denies signs of infection. He also declines debridement today. He he states it is painful and does not want to have this done. 7/6; Patient presents for 1 week follow-up. He has been using Hydrofera Blue under Kerlix/Coban. He denies signs of infection. He declines debridement today. 7/15; patient presents for 1  week follow-up. He has been using Hydrofera Blue under Kerlix/Coban. He denies signs of infection today. He is agreeable to having debridement done today. 7/20; patient presents for 1 week follow-up. He is in good spirits today. He has been using Hydrofera Blue under Kerlix/Coban. He denies signs of infection today. 7/27; patient presents for 1 week follow-up. He is in good spirits today. He has no issues or complaints  today. He denies signs of infection. 08/12/2020 upon evaluation today patient appears to be doing better in regard to his wound. He has been tolerating the dressing changes without complication. With that being said he did have arterial studies and does show that he has a TBI on the right of 0.90 and a TBI on the left of 0.76 which is excellent. There is no evidence of arterial compromise at this point. 8/17; patient presents for 1 week follow-up. He has no issues or complaints today. He denies signs of infection. 8/24; patient presents for 1 week follow-up. He has no issues or complaints today. He denies signs of infection. He continues to tolerate the compression wrap well. 8/31; patient presents for 1 week follow-up. He has no issues or complaints today. He denies signs of infection. He is in good spirits today. 9/7; patient presents for 1 week follow-up. He is tolerated the compression wrap well. He has no issues or complaints today. He denies signs of infection. 9/14; patient presents for 1 week follow-up. He has no issues or complaints today. He denies signs of infection. He is in excellent spirits today. 9/21; patient presents for 1 week follow-up. He has no issues or complaints today. OBINNA, EHRESMAN ([id number]) 9/28; patient presents for 1 week follow-up. He has tolerated the compression wrap well. He has no issues or complaints today. Patient History Information obtained from Patient. Social History Never smoker, Marital Status - Single, Alcohol Use - Moderate, Drug Use - No History, Caffeine Use - Never. Medical History Eyes Denies history of Cataracts, Glaucoma, Optic Neuritis Ear/Nose/Mouth/Throat Denies history of Chronic sinus problems/congestion Hematologic/Lymphatic Denies history of Anemia, Hemophilia, Human Immunodeficiency Virus, Lymphedema Respiratory Denies history of Aspiration, Asthma, Chronic Obstructive Pulmonary Disease (COPD), Pneumothorax, Sleep Apnea,  Tuberculosis Cardiovascular Patient has history of Hypertension Denies history of Angina, Arrhythmia, Congestive Heart Failure, Coronary Artery Disease, Deep Vein Thrombosis, Hypotension, Myocardial Infarction, Peripheral Arterial Disease, Peripheral Venous Disease, Phlebitis, Vasculitis Gastrointestinal Denies history of Cirrhosis , Colitis, Crohn s, Hepatitis A, Hepatitis B Endocrine Patient has history of Type II Diabetes Denies history of Type I Diabetes Genitourinary Denies history of End Stage Renal Disease Immunological Denies history of Lupus Erythematosus, Raynaud s, Scleroderma Integumentary (Skin) Denies history of History of Burn, History of pressure wounds Musculoskeletal Denies history of Gout, Rheumatoid Arthritis, Osteoarthritis, Osteomyelitis Neurologic Patient has history of Neuropathy Denies history of Dementia, Quadriplegia, Paraplegia, Seizure Disorder Oncologic Denies history of Received Chemotherapy, Received Radiation Psychiatric Denies history of Anorexia/bulimia, Confinement Anxiety Objective Constitutional Vitals Time Taken: 3:58 PM, Height: 70 in, Weight: 290 lbs, BMI: 41.6, Temperature: 98.2 F, Pulse: 78 bpm, Respiratory Rate: 16 breaths/min, Blood Pressure: 128/68 mmHg. General Notes: Left lower extremity: To the posterior aspect there is an open wound with granulation tissue present. Appears well-healing. No signs of infection. Good edema control. Integumentary (Hair, Skin) Wound #1 status is Open. Original cause of wound was Gradually Appeared. The date acquired was: 04/08/2020. The wound has been in treatment 19 weeks. The wound is located on the Left,Posterior Lower Leg. The wound measures 0.5cm length x 0.2cm  width x 0.1cm depth; 0.079cm^2 area and 0.008cm^3 volume. There is Fat Layer (Subcutaneous Tissue) exposed. There is no tunneling or undermining noted. There is a large amount of serosanguineous drainage noted. There is large (67-100%) red,  pink granulation within the wound bed. There is a small (1-33%) amount of necrotic tissue within the wound bed including Adherent Slough. Assessment CHRSTOPHER, MALENFANT ([id number]) Active Problems ICD-10 Type 2 diabetes mellitus with other skin ulcer Venous insufficiency (chronic) (peripheral) Non-pressure chronic ulcer of other part of left lower leg with fat layer exposed Essential (primary) hypertension Chronic venous hypertension (idiopathic) with ulcer and inflammation of right lower extremity Patient's wound continues to show improvement in healing. It is almost closed. I recommended continuing Hydrofera Blue under compression wrap. I am hopeful this will be closed next week. Procedures Wound #1 Pre-procedure diagnosis of Wound #1 is a Diabetic Wound/Ulcer of the Lower Extremity located on the Left,Posterior Lower Leg . There was a Three Layer Compression Therapy Procedure by Donnamarie Poag, RN. Post procedure Diagnosis Wound #1: Same as Pre-Procedure Plan Follow-up Appointments: Return Appointment in 1 week. Nurse Visit as needed Bathing/ Shower/ Hygiene: Wound #1 Left,Posterior Lower Leg: May shower with wound dressing protected with water repellent cover or cast protector. - cover dressing during shower-do not get it wet No tub bath. Edema Control - Lymphedema / Segmental Compressive Device / Other: Optional: One layer of unna paste to top of compression wrap (to act as an anchor). Elevate, Exercise Daily and Avoid Standing for Long Periods of Time. Elevate legs to the level of the heart and pump ankles as often as possible Elevate leg(s) parallel to the floor when sitting. DO YOUR BEST to sleep in the bed at night. DO NOT sleep in your recliner. Long hours of sitting in a recliner leads to swelling of the legs and/or potential wounds on your backside. Additional Orders / Instructions: Follow Nutritious Diet and Increase Protein Intake WOUND #1: - Lower Leg Wound Laterality:  Left, Posterior Cleanser: Soap and Water 1 x Per Week/30 Days Discharge Instructions: Gently cleanse wound with antibacterial soap, rinse and pat dry prior to dressing wounds Primary Dressing: Hydrofera Blue Ready Transfer Foam, 2.5x2.5 (in/in) 1 x Per Week/30 Days Discharge Instructions: Apply Hydrofera Blue Ready to wound bed as directed Secondary Dressing: Gauze 1 x Per Week/30 Days Compression Wrap: Profore Lite LF 3 Multilayer Compression Bandaging System 1 x Per Week/30 Days Discharge Instructions: Apply 3 multi-layer wrap as prescribed. Compression Stockings: Circaid Juxta Lite Compression Wrap Compression Amount: 20-30 mmHg (left) Discharge Instructions: Apply Circaid Juxta Lite Compression Wrap as directed 1. Hydrofera Blue under compression 2. Follow-up in 1 week Electronic Signature(s) Signed: 09/30/2020 2:23:56 PM By: Kalman Shan DO Entered By: Kalman Shan on 09/30/2020 14:23:13 Shane Bean ([id number]) -------------------------------------------------------------------------------- ROS/PFSH Details Patient Name: Shane Bean Date of Service: 09/30/2020 1:45 PM Medical Record Number: [id number] Patient Account Number: [id number] Date of Birth/Sex: 08-Feb-1955 (65 y.o. M) Treating RN: Donnamarie Poag Primary Care Provider: Reeves Dam Other Clinician: Referring Provider: Reeves Dam Treating Provider/Extender: Yaakov Guthrie in Treatment: 19 Information Obtained From Patient Eyes Medical History: Negative for: Cataracts; Glaucoma; Optic Neuritis Ear/Nose/Mouth/Throat Medical History: Negative for: Chronic sinus problems/congestion Hematologic/Lymphatic Medical History: Negative for: Anemia; Hemophilia; Human Immunodeficiency Virus; Lymphedema Respiratory Medical History: Negative for: Aspiration; Asthma; Chronic Obstructive Pulmonary Disease (COPD); Pneumothorax; Sleep Apnea; Tuberculosis Cardiovascular Medical History: Positive for:  Hypertension Negative for: Angina; Arrhythmia; Congestive Heart Failure; Coronary Artery Disease; Deep Vein Thrombosis; Hypotension; Myocardial Infarction; Peripheral  Arterial Disease; Peripheral Venous Disease; Phlebitis; Vasculitis Gastrointestinal Medical History: Negative for: Cirrhosis ; Colitis; Crohnos; Hepatitis A; Hepatitis B Endocrine Medical History: Positive for: Type II Diabetes Negative for: Type I Diabetes Time with diabetes: 3 Treated with: Oral agents Blood sugar tested every day: No Genitourinary Medical History: Negative for: End Stage Renal Disease Immunological Medical History: Negative for: Lupus Erythematosus; Raynaudos; Scleroderma Integumentary (Skin) Medical History: Negative for: History of Burn; History of pressure wounds Mcclane, Jamarrius V. ([id number]) Musculoskeletal Medical History: Negative for: Gout; Rheumatoid Arthritis; Osteoarthritis; Osteomyelitis Neurologic Medical History: Positive for: Neuropathy Negative for: Dementia; Quadriplegia; Paraplegia; Seizure Disorder Oncologic Medical History: Negative for: Received Chemotherapy; Received Radiation Psychiatric Medical History: Negative for: Anorexia/bulimia; Confinement Anxiety Immunizations Pneumococcal Vaccine: Received Pneumococcal Vaccination: Yes Received Pneumococcal Vaccination On or After 60th Birthday: No Implantable Devices None Family and Social History Never smoker; Marital Status - Single; Alcohol Use: Moderate; Drug Use: No History; Caffeine Use: Never; Financial Concerns: No; Food, Clothing or Shelter Needs: No; Support System Lacking: No; Transportation Concerns: No Electronic Signature(s) Signed: 09/30/2020 2:23:56 PM By: Kalman Shan DO Signed: 09/30/2020 3:51:14 PM By: Donnamarie Poag Entered By: Kalman Shan on 09/30/2020 14:21:51 Shane Bean ([id number]) -------------------------------------------------------------------------------- [place]  Details Patient Name: Shane Bean Date of Service: 09/30/2020 Medical Record Number: [id number] Patient Account Number: [id number] Date of Birth/Sex: 1955-02-18 (65 y.o. M) Treating RN: Donnamarie Poag Primary Care Provider: Reeves Dam Other Clinician: Referring Provider: Reeves Dam Treating Provider/Extender: Yaakov Guthrie in Treatment: 19 Diagnosis Coding ICD-10 Codes Code Description E11.622 Type 2 diabetes mellitus with other skin ulcer I87.2 Venous insufficiency (chronic) (peripheral) 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 layer exposed I10 Essential (primary) hypertension I87.331 Chronic venous hypertension (idiopathic) with ulcer and inflammation of right lower extremity Facility Procedures CPT4 Code: [id number] Description: (Facility Use Only) [phone] - [employer] LWR LT LEG Modifier: Quantity: 1 Physician Procedures CPT4 Code Description: [id number] - WC PHYS LEVEL 3 - EST PT Modifier: Quantity: 1 CPT4 Code Description: ICD-10 Diagnosis Description E11.622 Type 2 diabetes mellitus with other skin ulcer I87.2 Venous insufficiency (chronic) (peripheral) 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 lay I87.331 Chronic  venous hypertension (idiopathic) with ulcer and inflammation of Modifier: er exposed right lower extremit Quantity: y Engineer, maintenance) Signed: 09/30/2020 2:23:56 PM By: Kalman Shan DO Entered By: Kalman Shan on 09/30/2020 14:23:30

## 2020-09-30 NOTE — Progress Notes (Signed)
Shane Bean, Shane Bean ([id number]) Visit Report for 09/30/2020 Arrival Information Details Patient Name: Shane Bean, Shane Bean Date of Service: 09/30/2020 1:45 PM Medical Record Number: [id number] Patient Account Number: [id number] Date of Birth/Sex: August 30, 1955 (64 y.o. M) Treating RN: Donnamarie Poag Primary Care Denene Alamillo: Reeves Dam Other Clinician: Referring Akesha Uresti: Reeves Dam Treating Muhsin Doris/Extender: Yaakov Guthrie in Treatment: 19 Visit Information History Since Last Visit Added or deleted any medications: No Patient Arrived: Ambulatory Had a fall or experienced change in No Arrival Time: 13:56 activities of daily living that may affect Accompanied By: self risk of falls: Transfer Assistance: None Hospitalized since last visit: No Patient Identification Verified: Yes Has Dressing in Place as Prescribed: Yes Secondary Verification Process Completed: Yes Has Compression in Place as Prescribed: Yes Patient Requires Transmission-Based No Pain Present Now: Yes Precautions: Patient Has Alerts: Yes Patient Alerts: Patient on Blood Thinner 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 M) Treating RN: Donnamarie Poag Primary Care Latrina Guttman: Reeves Dam Other Clinician: Referring Anysia Choi: Reeves Dam Treating Nur Krasinski/Extender: Yaakov Guthrie in Treatment: 19 Clinic Level of Care Assessment Items TOOL 1 Quantity Score []  - Use when EandM and  Procedure is performed on INITIAL visit 0 ASSESSMENTS - Nursing Assessment / Reassessment []  - General Physical Exam (combine w/ comprehensive assessment (listed just below) when performed on new 0 pt. evals) []  - 0 Comprehensive Assessment (HX, ROS, Risk Assessments, Wounds Hx, etc.) ASSESSMENTS - Wound and Skin Assessment / Reassessment []  - Dermatologic / Skin Assessment (not related to wound area) 0 ASSESSMENTS - Ostomy and/or Continence Assessment and Care []  - Incontinence Assessment and Management 0 []  - 0 Ostomy Care Assessment and Management (repouching, etc.) PROCESS - Coordination of Care []  - Simple Patient / Family Education for ongoing care 0 []  - 0 Complex (extensive) Patient / Family Education for ongoing care []  - 0 Staff obtains Programmer, systems, Records, Test Results / Process Orders []  - 0 Staff telephones HHA, Nursing Homes / Clarify orders / etc []  - 0 Routine Transfer to another Facility (non-emergent condition) []  - 0 Routine Hospital Admission (non-emergent condition) []  - 0 New Admissions / Biomedical engineer / Ordering NPWT, Apligraf, etc. []  - 0 Emergency Hospital Admission (emergent condition) PROCESS - Special Needs []  - Pediatric / Minor Patient Management 0 []  - 0 Isolation Patient Management []  - 0 Hearing / Language / Visual special needs []  - 0 Assessment of Community assistance (transportation, D/C planning, etc.) []  - 0 Additional assistance / Altered mentation []  - 0 Support Surface(s) Assessment (bed, cushion, seat, etc.) INTERVENTIONS - Miscellaneous []  - External ear exam 0 []  - 0 Patient Transfer (multiple staff / Civil Service fast streamer / Similar devices) []  - 0 Simple Staple / Suture removal (25 or less) []  - 0 Complex Staple / Suture removal (26 or more) []  - 0 Hypo/Hyperglycemic Management (do not check if billed separately) []  - 0 Ankle / Brachial Index (ABI) - do not check if billed separately Has the patient been seen at the  hospital within the last three years: Yes Total Score: 0 Level Of Care: ____ Shane Bean ([id number]) Electronic Signature(s)  Signed: 09/30/2020 3:51:14 PM By: Donnamarie Poag Entered By: Donnamarie Poag on 09/30/2020 14:10:52 Shane Bean ([id number]) -------------------------------------------------------------------------------- Compression Therapy Details Patient Name: Shane Bean Date of Service: 09/30/2020 1:45 PM Medical Record Number: [id number] Patient Account Number: [id number] Date of Birth/Sex: [birth year] (64 y.o. M) Treating RN: Donnamarie Poag Primary Care Kanon Colunga: Reeves Dam Other Clinician: Referring Gray Doering: Reeves Dam Treating Guiseppe Flanagan/Extender: Yaakov Guthrie in Treatment: 19 Compression Therapy Performed for Wound Assessment: Wound #1 Left,Posterior Lower Leg Performed By: Clinician Donnamarie Poag, RN Compression Type: Three Layer Post Procedure Diagnosis Same as Pre-procedure Electronic Signature(s) Signed: 09/30/2020 3:51:14 PM By: Donnamarie Poag Entered By: Donnamarie Poag on 09/30/2020 14:09:50 Shane Bean ([id number]) -------------------------------------------------------------------------------- Encounter Discharge Information Details Patient Name: Shane Bean Date of Service: 09/30/2020 1:45 PM Medical Record Number: [id number] Patient Account Number: [id number] Date of Birth/Sex: [birth year] (64 y.o. M) Treating RN: Donnamarie Poag Primary Care Patricia Perales: Reeves Dam Other Clinician: Referring Gus Littler: Reeves Dam Treating Nhyla Nappi/Extender: Yaakov Guthrie in Treatment: 72 Encounter Discharge Information Items Discharge Condition: Stable Ambulatory Status: Ambulatory Discharge Destination: Home Transportation: Private Auto Accompanied By: self Schedule Follow-up Appointment: Yes Clinical Summary of Care: Electronic Signature(s) Signed: 09/30/2020 3:51:14 PM By: Donnamarie Poag Entered By: Donnamarie Poag on 09/30/2020  14:11:48 Shane Bean ([id number]) -------------------------------------------------------------------------------- Lower Extremity Assessment Details Patient Name: Shane Bean Date of Service: 09/30/2020 1:45 PM Medical Record Number: [id number] Patient Account Number: [id number] Date of Birth/Sex: May 30, 1955 (64 y.o. M) Treating RN: Donnamarie Poag Primary Care Treacy Holcomb: Reeves Dam Other Clinician: Referring Armstrong Creasy: Reeves Dam Treating Amanat Hackel/Extender: Yaakov Guthrie in Treatment: 19 Edema Assessment Assessed: Shirlyn Goltz: Yes] [Right: No] Edema: [Left: N] [Right: o] Calf Left: Right: Point of Measurement: 34 cm From Medial Instep 40 cm Ankle Left: Right: Point of Measurement: 10 cm From Medial Instep 25 cm Vascular Assessment Pulses: Dorsalis Pedis Palpable: [Left:Yes] Electronic Signature(s) Signed: 09/30/2020 3:51:14 PM By: Donnamarie Poag Entered By: Donnamarie Poag on 09/30/2020 14:04:44 Shane Bean ([id number]) -------------------------------------------------------------------------------- Multi Wound Chart Details Patient Name: Shane Bean Date of Service: 09/30/2020 1:45 PM Medical Record Number: [id number] Patient Account Number: [id number] Date of Birth/Sex: 1955/04/27 (64 y.o. M) Treating RN: Donnamarie Poag Primary Care Gionna Polak: Reeves Dam Other Clinician: Referring Tommy Goostree: Reeves Dam Treating Sava Proby/Extender: Yaakov Guthrie in Treatment: 19 Vital Signs Height(in): 70 Pulse(bpm): 33 Weight(lbs): 290 Blood Pressure(mmHg): 128/68 Body Mass Index(BMI): 42 Temperature(F): 98.2 Respiratory Rate(breaths/min): 16 Photos: [N/A:N/A] Wound Location: Left, Posterior Lower Leg N/A N/A Wounding Event: Gradually Appeared N/A N/A Primary Etiology: Diabetic Wound/Ulcer of the Lower N/A N/A Extremity Comorbid History: Hypertension, Type II Diabetes, N/A N/A Neuropathy Date Acquired: 04/08/2020 N/A N/A Weeks of Treatment: 19  N/A N/A Wound Status: Open N/A N/A Measurements L x W x D (cm) 0.5x0.2x0.1 N/A N/A Area (cm) : 0.079 N/A N/A Volume (cm) : 0.008 N/A N/A % Reduction in Area: 99.30% N/A N/A % Reduction in Volume: 99.30% N/A N/A Classification: Grade 2 N/A N/A Exudate Amount: Large N/A N/A Exudate Type: Serosanguineous N/A N/A Exudate Color: red, brown N/A N/A Granulation Amount: Large (67-100%) N/A N/A Granulation Quality: Red, Pink N/A N/A Necrotic Amount: Small (1-33%) N/A N/A Exposed Structures: Fat Layer (Subcutaneous Tissue): N/A N/A Yes Fascia: No Tendon: No Muscle: No Joint: No Bone: No Epithelialization: Medium (34-66%) N/A N/A Procedures Performed: Compression Therapy N/A N/A Treatment Notes Wound #1 (Lower Leg) Wound Laterality: Left, Posterior Cleanser Soap and Water Discharge Instruction: Gently cleanse wound with antibacterial soap, rinse and pat dry prior to dressing wounds  Peri-Wound Care WRAY, GOEHRING ([id number]) Topical Primary Dressing Hydrofera Blue Ready Transfer Foam, 2.5x2.5 (in/in) Discharge Instruction: Apply Hydrofera Blue Ready to wound bed as directed Secondary Dressing Gauze Secured With Compression Wrap Profore Lite LF 3 Multilayer Compression Bandaging System Discharge Instruction: Apply 3 multi-layer wrap as prescribed. Compression Stockings Circaid Juxta Lite Compression Wrap Quantity: 1 Left Leg Compression Amount: 20-30 mmHg Discharge Instruction: Apply Circaid Juxta Lite Compression Wrap as directed Add-Ons Electronic Signature(s) Signed: 09/30/2020 2:23:56 PM By: Kalman Shan DO Entered By: Kalman Shan on 09/30/2020 14:21:13 Shane Bean ([id number]) -------------------------------------------------------------------------------- Multi-Disciplinary Care Plan Details Patient Name: Shane Bean Date of Service: 09/30/2020 1:45 PM Medical Record Number: [id number] Patient Account Number: [id number] Date of Birth/Sex:  08-09-1955 (64 y.o. M) Treating RN: Donnamarie Poag Primary Care Deamonte Sayegh: Reeves Dam Other Clinician: Referring Horace Lukas: Reeves Dam Treating Emelie Newsom/Extender: Yaakov Guthrie in Treatment: 82 Active Inactive Electronic Signature(s) Signed: 09/30/2020 3:51:14 PM By: Donnamarie Poag Entered By: Donnamarie Poag on 09/30/2020 14:05:03 Shane Bean ([id number]) -------------------------------------------------------------------------------- Pain Assessment Details Patient Name: Shane Bean Date of Service: 09/30/2020 1:45 PM Medical Record Number: [id number] Patient Account Number: [id number] Date of Birth/Sex: [birth year] (64 y.o. M) Treating RN: Donnamarie Poag Primary Care Khush Pasion: Reeves Dam Other Clinician: Referring Chena Chohan: Reeves Dam Treating Lacrisha Bielicki/Extender: Yaakov Guthrie in Treatment: 19 Active Problems Location of Pain Severity and Description of Pain Patient Has Paino Yes Site Locations Pain Location: Pain in Ulcers Rate the pain. Current Pain Level: 6 Pain Management and Medication Current Pain Management: Electronic Signature(s) Signed: 09/30/2020 3:51:14 PM By: Donnamarie Poag Entered By: Donnamarie Poag on 09/30/2020 14:01:12 Shane Bean ([id number]) -------------------------------------------------------------------------------- Patient/Caregiver Education Details Patient Name: Shane Bean Date of Service: 09/30/2020 1:45 PM Medical Record Number: [id number] Patient Account Number: [id number] Date of Birth/Gender: October 08, 1955 (64 y.o. M) Treating RN: Donnamarie Poag Primary Care Physician: Reeves Dam Other Clinician: Referring Physician: Reeves Dam Treating Physician/Extender: Yaakov Guthrie in Treatment: 53 Education Assessment Education Provided To: Patient Education Topics Provided Wound/Skin Impairment: Electronic Signature(s) Signed: 09/30/2020 3:51:14 PM By: Donnamarie Poag Entered By: Donnamarie Poag on 09/30/2020  14:11:07 Shane Bean ([id number]) -------------------------------------------------------------------------------- Wound Assessment Details Patient Name: Shane Bean Date of Service: 09/30/2020 1:45 PM Medical Record Number: [id number] Patient Account Number: [id number] Date of Birth/Sex: 1955-03-19 (64 y.o. M) Treating RN: Donnamarie Poag Primary Care Renetta Suman: Reeves Dam Other Clinician: Referring Inessa Wardrop: Reeves Dam Treating Makhia Vosler/Extender: Yaakov Guthrie in Treatment: 19 Wound Status Wound Number: 1 Primary Etiology: Diabetic Wound/Ulcer of the Lower Extremity Wound Location: Left, Posterior Lower Leg Wound Status: Open Wounding Event: Gradually Appeared Comorbid History: Hypertension, Type II Diabetes, Neuropathy Date Acquired: 04/08/2020 Weeks Of Treatment: 19 Clustered Wound: No Photos Wound Measurements Length: (cm) 0.5 Width: (cm) 0.2 Depth: (cm) 0.1 Area: (cm) 0.079 Volume: (cm) 0.008 % Reduction in Area: 99.3% % Reduction in Volume: 99.3% Epithelialization: Medium (34-66%) Tunneling: No Undermining: No Wound Description Classification: Grade 2 Exudate Amount: Large Exudate Type: Serosanguineous Exudate Color: red, brown Foul Odor After Cleansing: No Slough/Fibrino Yes Wound Bed Granulation Amount: Large (67-100%) Exposed Structure Granulation Quality: Red, Pink Fascia Exposed: No Necrotic Amount: Small (1-33%) Fat Layer (Subcutaneous Tissue) Exposed: Yes Necrotic Quality: Adherent Slough Tendon Exposed: No Muscle Exposed: No Joint Exposed: No Bone Exposed: No Treatment Notes Wound #1 (Lower Leg) Wound Laterality: Left, Posterior Cleanser Soap and Water Discharge Instruction: Gently cleanse wound with antibacterial soap, rinse and pat dry prior to dressing wounds [place]alea [place], Jousha V. ([id number]) Topical Primary Dressing  Hydrofera Blue Ready Transfer Foam, 2.5x2.5 (in/in) Discharge Instruction: Apply  Hydrofera Blue Ready to wound bed as directed Secondary Dressing Gauze Secured With Compression Wrap Profore Lite LF 3 Multilayer Compression Bandaging System Discharge Instruction: Apply 3 multi-layer wrap as prescribed. Compression Stockings Circaid Juxta Lite Compression Wrap Quantity: 1 Left Leg Compression Amount: 20-30 mmHg Discharge Instruction: Apply Circaid Juxta Lite Compression Wrap as directed Add-Ons Electronic Signature(s) Signed: 09/30/2020 3:51:14 PM By: Donnamarie Poag Entered By: Donnamarie Poag on 09/30/2020 14:04:11 Shane Bean ([id number]) -------------------------------------------------------------------------------- [place] Details Patient Name: Shane Bean Date of Service: 09/30/2020 1:45 PM Medical Record Number: [id number] Patient Account Number: [id number] Date of Birth/Sex: [birth year] (64 y.o. M) Treating RN: Donnamarie Poag Primary Care Cleotilde Spadaccini: Reeves Dam Other Clinician: Referring Hermina Barnard: Reeves Dam Treating Ananias Kolander/Extender: Yaakov Guthrie in Treatment: 19 Vital Signs Time Taken: 15:58 Temperature (F): 98.2 Height (in): 70 Pulse (bpm): 78 Weight (lbs): 290 Respiratory Rate (breaths/min): 16 Body Mass Index (BMI): 41.6 Blood Pressure (mmHg): 128/68 Reference Range: 80 - 120 mg / dl Electronic Signature(s) Signed: 09/30/2020 3:51:14 PM By: Donnamarie Poag Entered ByDonnamarie Poag on 09/30/2020 14:01:02

## 2020-10-07 ENCOUNTER — Other Ambulatory Visit: Payer: Self-pay

## 2020-10-07 ENCOUNTER — Encounter: Payer: Medicare (Managed Care) | Attending: Internal Medicine | Admitting: Internal Medicine

## 2020-10-07 DIAGNOSIS — E11622 Type 2 diabetes mellitus with other skin ulcer: Secondary | ICD-10-CM | POA: Insufficient documentation

## 2020-10-07 DIAGNOSIS — E1151 Type 2 diabetes mellitus with diabetic peripheral angiopathy without gangrene: Secondary | ICD-10-CM | POA: Diagnosis not present

## 2020-10-07 DIAGNOSIS — E1142 Type 2 diabetes mellitus with diabetic polyneuropathy: Secondary | ICD-10-CM | POA: Insufficient documentation

## 2020-10-07 DIAGNOSIS — I872 Venous insufficiency (chronic) (peripheral): Secondary | ICD-10-CM | POA: Diagnosis not present

## 2020-10-07 DIAGNOSIS — L97822 Non-pressure chronic ulcer of other part of left lower leg with fat layer exposed: Secondary | ICD-10-CM | POA: Diagnosis not present

## 2020-10-07 NOTE — Progress Notes (Signed)
INGRAM, ONNEN ([id number]) Visit Report for 10/07/2020 Chief Complaint Document Details Patient Name: Shane Bean, Shane Bean Date of Service: 10/07/2020 2:30 PM Medical Record Number: [id number] Patient Account Number: [id number] Date of Birth/Sex: 1956/01/03 (64 y.o. M) Treating RN: Donnamarie Poag Primary Care Provider: Reeves Dam Other Clinician: Referring Provider: Reeves Dam Treating Provider/Extender: Yaakov Guthrie in Treatment: 20 Information Obtained from: Patient Chief Complaint Left LE Ulcer Electronic Signature(s) Signed: 10/07/2020 3:08:51 PM By: Kalman Shan DO Entered By: Kalman Shan on 10/07/2020 15:05:10 Shane Bean ([id number]) -------------------------------------------------------------------------------- HPI Details Patient Name: Shane Bean Date of Service: 10/07/2020 2:30 PM Medical Record Number: [id number] Patient Account Number: [id number] Date of Birth/Sex: [birth year] (64 y.o. M) Treating RN: Donnamarie Poag Primary Care Provider: Reeves Dam Other Clinician: Referring Provider: Reeves Dam Treating Provider/Extender: Yaakov Guthrie in Treatment: 20 History of Present Illness HPI Description: 05/18/2020 upon evaluation today patient appears for initial evaluation here in clinic concerning issues that he has been having with the wound on his left lateral leg. Fortunately there does not appear to be any signs of obvious an active infection at this time which is great news. With that being said the patient unfortunately is continued to have issues with pain although he tells me it is gotten a lot better. He was on Keflex as well as Bactrim DS which seems to have done a good job there. His most recent hemoglobin A1c was 9.8 that was on 04/14/2020. Subsequently I do feel like that the patient is making progress here. He does have a history of chronic venous insufficiency as well as hypertension. I do not see any need for  antibiotics at this point. 5/25; follow-up of the wound on the left posterior calf. This is completely necrotic on the surface. It does not look infected but it is painful. He is a diabetic but I think there is some suggestion here of chronic venous disease as well. We used Iodoflex under compression last week 6/1; again a completely necrotic surface on this with the wound. We have been using Iodoflex under compression he complains of gnawing pain. He has had wounds previously a lot of this looks like chronic venous disease. 6/8; about two thirds of the surface of this wound is still covered in a black necrotic eschar. I changed him from Iodoflex to [place] last week because of complaints of pain. He actually was seen by her neurologist this weeko Peripheral neuropathy as a cause of the pain and they changed him from Neurontin to Lyrica but he still has not had any relief. He says that most of the pain however is in his heel not in his wound per se. He is a diabetic 6/15. This is a patient with what looks to be a venous wound on the left lateral lower leg. He is also a diabetic. Currently being worked up for diabetic neuropathy. He complains of pain out of proportion to the size of the wound although to be fair the entire area here was eschared. We have been working to get a viable surface. We are using Sorbact 6/22; fairly painful wound on the left posterior calf. I am assuming this is been venous he is also a diabetic. His ABI in our clinic was noncompressible however he has easily palpable pulses on his feet. He complains of a lot of pain in the wound also of the left heel and the upper left calf. Some of this may be neuropathy. It is led me to discontinue Iodoflex reduce  his compression but he still complains of pain in fact he says he could not take a debridement today. When I first saw this it was completely necrotic surface we have got it down to something that looks a reasonably healthy I  have been wondering about biopsying this area however he is on Coumadin and with the pain I have put this off. The major question would be an inflammatory ulcer such as pyoderma. The patient is not aware of how this started. He does however have a wound history on both legs. 6/29; patient presents for 1 week follow-up. He has been using Hydrofera Blue under Kerlix/Coban. He has tolerated this well. He currently denies signs of infection. He also declines debridement today. He he states it is painful and does not want to have this done. 7/6; Patient presents for 1 week follow-up. He has been using Hydrofera Blue under Kerlix/Coban. He denies signs of infection. He declines debridement today. 7/15; patient presents for 1 week follow-up. He has been using Hydrofera Blue under Kerlix/Coban. He denies signs of infection today. He is agreeable to having debridement done today. 7/20; patient presents for 1 week follow-up. He is in good spirits today. He has been using Hydrofera Blue under Kerlix/Coban. He denies signs of infection today. 7/27; patient presents for 1 week follow-up. He is in good spirits today. He has no issues or complaints today. He denies signs of infection. 08/12/2020 upon evaluation today patient appears to be doing better in regard to his wound. He has been tolerating the dressing changes without complication. With that being said he did have arterial studies and does show that he has a TBI on the right of 0.90 and a TBI on the left of 0.76 which is excellent. There is no evidence of arterial compromise at this point. 8/17; patient presents for 1 week follow-up. He has no issues or complaints today. He denies signs of infection. 8/24; patient presents for 1 week follow-up. He has no issues or complaints today. He denies signs of infection. He continues to tolerate the compression wrap well. 8/31; patient presents for 1 week follow-up. He has no issues or complaints today. He denies  signs of infection. He is in good spirits today. 9/7; patient presents for 1 week follow-up. He is tolerated the compression wrap well. He has no issues or complaints today. He denies signs of infection. 9/14; patient presents for 1 week follow-up. He has no issues or complaints today. He denies signs of infection. He is in excellent spirits today. 9/21; patient presents for 1 week follow-up. He has no issues or complaints today. 9/28; patient presents for 1 week follow-up. He has tolerated the compression wrap well. He has no issues or complaints today. 10/5; patient presents for 1 week follow-up. He has no issues or complaints today. Shane Bean, Shane Bean ([id number]) Electronic Signature(s) Signed: 10/07/2020 3:08:51 PM By: Kalman Shan DO Entered By: Kalman Shan on 10/07/2020 15:05:27 Shane Bean ([id number]) -------------------------------------------------------------------------------- Physical Exam Details Patient Name: Shane Bean Date of Service: 10/07/2020 2:30 PM Medical Record Number: [id number] Patient Account Number: [id number] Date of Birth/Sex: July 05, 1955 (64 y.o. M) Treating RN: Donnamarie Poag Primary Care Provider: Reeves Dam Other Clinician: Referring Provider: Reeves Dam Treating Provider/Extender: Yaakov Guthrie in Treatment: 20 Constitutional . Cardiovascular . Psychiatric . Notes Left lower extremity: To the posterior aspect there is an open wound with granulation tissue present. Larger from last clinic visit. Good edema control. Electronic Signature(s) Signed: 10/07/2020 3:08:51 PM By: Heber [place],  Janett Billow DO Entered By: Kalman Shan on 10/07/2020 15:06:10 Shane Bean ([id number]) -------------------------------------------------------------------------------- Physician Orders Details Patient Name: Shane Bean Date of Service: 10/07/2020 2:30 PM Medical Record Number: [id number] Patient Account Number:  [id number] Date of Birth/Sex: 1955-06-18 (64 y.o. M) Treating RN: Donnamarie Poag Primary Care Provider: Reeves Dam Other Clinician: Referring Provider: Reeves Dam Treating Provider/Extender: Yaakov Guthrie in Treatment: 20 Verbal / Phone Orders: No Diagnosis Coding Follow-up Appointments o Return Appointment in 1 week. o Nurse Visit as needed Bathing/ Shower/ Hygiene Wound #1 Left,Posterior Lower Leg o May shower with wound dressing protected with water repellent cover or cast protector. - cover dressing during shower-do not get it wet o No tub bath. Edema Control - Lymphedema / Segmental Compressive Device / Other o Optional: One layer of unna paste to top of compression wrap (to act as an anchor). o Elevate, Exercise Daily and Avoid Standing for Long Periods of Time. o Elevate legs to the level of the heart and pump ankles as often as possible o Elevate leg(s) parallel to the floor when sitting. o DO YOUR BEST to sleep in the bed at night. DO NOT sleep in your recliner. Long hours of sitting in a recliner leads to swelling of the legs and/or potential wounds on your backside. Additional Orders / Instructions o Follow Nutritious Diet and Increase Protein Intake Wound Treatment Wound #1 - Lower Leg Wound Laterality: Left, Posterior Cleanser: Soap and Water 1 x Per Week/30 Days Discharge Instructions: Gently cleanse wound with antibacterial soap, rinse and pat dry prior to dressing wounds Topical: Gentamicin 1 x Per Week/30 Days Discharge Instructions: Apply as directed by provider. Primary Dressing: Silvercel Small 2x2 (in/in) 1 x Per Week/30 Days Discharge Instructions: Apply Silvercel Small 2x2 (in/in) as instructed Secondary Dressing: ABD Pad 5x9 (in/in) 1 x Per Week/30 Days Discharge Instructions: Cover with ABD pad Compression Wrap: Profore Lite LF 3 Multilayer Compression Bandaging System 1 x Per Week/30 Days Discharge Instructions: Apply 3  multi-layer wrap as prescribed. Compression Stockings: Circaid Juxta Lite Compression Wrap Left Leg Compression Amount: 20-30 mmHG Discharge Instructions: Apply Circaid Juxta Lite Compression Wrap as directed Electronic Signature(s) Signed: 10/07/2020 3:06:25 PM By: Donnamarie Poag Signed: 10/07/2020 3:08:51 PM By: Kalman Shan DO Entered By: Donnamarie Poag on 10/07/2020 14:55:49 Shane Bean ([id number]) -------------------------------------------------------------------------------- Problem List Details Patient Name: Shane Bean Date of Service: 10/07/2020 2:30 PM Medical Record Number: [id number] Patient Account Number: [id number] Date of Birth/Sex: 1955/09/02 (64 y.o. M) Treating RN: Donnamarie Poag Primary Care Provider: Reeves Dam Other Clinician: Referring Provider: Reeves Dam Treating Provider/Extender: Yaakov Guthrie in Treatment: 20 Active Problems ICD-10 Encounter Code Description Active Date MDM Diagnosis E11.622 Type 2 diabetes mellitus with other skin ulcer 05/18/2020 No Yes I87.2 Venous insufficiency (chronic) (peripheral) 05/18/2020 No Yes 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 layer 05/18/2020 No Yes exposed [place] (primary) hypertension 05/18/2020 No Yes I87.331 Chronic venous hypertension (idiopathic) with ulcer and inflammation of 08/26/2020 No Yes right lower extremity Inactive Problems Resolved Problems Electronic Signature(s) Signed: 10/07/2020 3:08:51 PM By: Kalman Shan DO Entered By: Kalman Shan on 10/07/2020 15:04:55 Shane Bean ([id number]) -------------------------------------------------------------------------------- Progress Note Details Patient Name: Shane Bean Date of Service: 10/07/2020 2:30 PM Medical Record Number: [id number] Patient Account Number: [id number] Date of Birth/Sex: December 26, 1955 (64 y.o. M) Treating RN: Donnamarie Poag Primary Care Provider: Reeves Dam Other Clinician: Referring Provider: Reeves Dam Treating Provider/Extender: Yaakov Guthrie in Treatment: 20 Subjective  Chief Complaint Information obtained from Patient Left LE Ulcer History of Present Illness (HPI) 05/18/2020 upon evaluation today patient appears for initial evaluation here in clinic concerning issues that he has been having with the wound on his left lateral leg. Fortunately there does not appear to be any signs of obvious an active infection at this time which is great news. With that being said the patient unfortunately is continued to have issues with pain although he tells me it is gotten a lot better. He was on Keflex as well as Bactrim DS which seems to have done a good job there. His most recent hemoglobin A1c was 9.8 that was on 04/14/2020. Subsequently I do feel like that the patient is making progress here. He does have a history of chronic venous insufficiency as well as hypertension. I do not see any need for antibiotics at this point. 5/25; follow-up of the wound on the left posterior calf. This is completely necrotic on the surface. It does not look infected but it is painful. He is a diabetic but I think there is some suggestion here of chronic venous disease as well. We used Iodoflex under compression last week 6/1; again a completely necrotic surface on this with the wound. We have been using Iodoflex under compression he complains of gnawing pain. He has had wounds previously a lot of this looks like chronic venous disease. 6/8; about two thirds of the surface of this wound is still covered in a black necrotic eschar. I changed him from Iodoflex to [place] last week because of complaints of pain. He actually was seen by her neurologist this weeko Peripheral neuropathy as a cause of the pain and they changed him from Neurontin to Lyrica but he still has not had any relief. He says that most of the pain however is in his heel not in his wound per  se. He is a diabetic 6/15. This is a patient with what looks to be a venous wound on the left lateral lower leg. He is also a diabetic. Currently being worked up for diabetic neuropathy. He complains of pain out of proportion to the size of the wound although to be fair the entire area here was eschared. We have been working to get a viable surface. We are using Sorbact 6/22; fairly painful wound on the left posterior calf. I am assuming this is been venous he is also a diabetic. His ABI in our clinic was noncompressible however he has easily palpable pulses on his feet. He complains of a lot of pain in the wound also of the left heel and the upper left calf. Some of this may be neuropathy. It is led me to discontinue Iodoflex reduce his compression but he still complains of pain in fact he says he could not take a debridement today. When I first saw this it was completely necrotic surface we have got it down to something that looks a reasonably healthy I have been wondering about biopsying this area however he is on Coumadin and with the pain I have put this off. The major question would be an inflammatory ulcer such as pyoderma. The patient is not aware of how this started. He does however have a wound history on both legs. 6/29; patient presents for 1 week follow-up. He has been using Hydrofera Blue under Kerlix/Coban. He has tolerated this well. He currently denies signs of infection. He also declines debridement today. He he states it is painful and does not want to  have this done. 7/6; Patient presents for 1 week follow-up. He has been using Hydrofera Blue under Kerlix/Coban. He denies signs of infection. He declines debridement today. 7/15; patient presents for 1 week follow-up. He has been using Hydrofera Blue under Kerlix/Coban. He denies signs of infection today. He is agreeable to having debridement done today. 7/20; patient presents for 1 week follow-up. He is in good spirits today. He  has been using Hydrofera Blue under Kerlix/Coban. He denies signs of infection today. 7/27; patient presents for 1 week follow-up. He is in good spirits today. He has no issues or complaints today. He denies signs of infection. 08/12/2020 upon evaluation today patient appears to be doing better in regard to his wound. He has been tolerating the dressing changes without complication. With that being said he did have arterial studies and does show that he has a TBI on the right of 0.90 and a TBI on the left of 0.76 which is excellent. There is no evidence of arterial compromise at this point. 8/17; patient presents for 1 week follow-up. He has no issues or complaints today. He denies signs of infection. 8/24; patient presents for 1 week follow-up. He has no issues or complaints today. He denies signs of infection. He continues to tolerate the compression wrap well. 8/31; patient presents for 1 week follow-up. He has no issues or complaints today. He denies signs of infection. He is in good spirits today. 9/7; patient presents for 1 week follow-up. He is tolerated the compression wrap well. He has no issues or complaints today. He denies signs of infection. 9/14; patient presents for 1 week follow-up. He has no issues or complaints today. He denies signs of infection. He is in excellent spirits today. 9/21; patient presents for 1 week follow-up. He has no issues or complaints today. Shane Bean, Shane Bean ([id number]) 9/28; patient presents for 1 week follow-up. He has tolerated the compression wrap well. He has no issues or complaints today. 10/5; patient presents for 1 week follow-up. He has no issues or complaints today. Patient History Information obtained from Patient. Social History Never smoker, Marital Status - Single, Alcohol Use - Moderate, Drug Use - No History, Caffeine Use - Never. Medical History Eyes Denies history of Cataracts, Glaucoma, Optic Neuritis Ear/Nose/Mouth/Throat Denies  history of Chronic sinus problems/congestion Hematologic/Lymphatic Denies history of Anemia, Hemophilia, Human Immunodeficiency Virus, Lymphedema Respiratory Denies history of Aspiration, Asthma, Chronic Obstructive Pulmonary Disease (COPD), Pneumothorax, Sleep Apnea, Tuberculosis Cardiovascular Patient has history of Hypertension Denies history of Angina, Arrhythmia, Congestive Heart Failure, Coronary Artery Disease, Deep Vein Thrombosis, Hypotension, Myocardial Infarction, Peripheral Arterial Disease, Peripheral Venous Disease, Phlebitis, Vasculitis Gastrointestinal Denies history of Cirrhosis , Colitis, Crohn s, Hepatitis A, Hepatitis B Endocrine Patient has history of Type II Diabetes Denies history of Type I Diabetes Genitourinary Denies history of End Stage Renal Disease Immunological Denies history of Lupus Erythematosus, Raynaud s, Scleroderma Integumentary (Skin) Denies history of History of Burn, History of pressure wounds Musculoskeletal Denies history of Gout, Rheumatoid Arthritis, Osteoarthritis, Osteomyelitis Neurologic Patient has history of Neuropathy Denies history of Dementia, Quadriplegia, Paraplegia, Seizure Disorder Oncologic Denies history of Received Chemotherapy, Received Radiation Psychiatric Denies history of Anorexia/bulimia, Confinement Anxiety Objective Constitutional Vitals Time Taken: 2:46 PM, Height: 70 in, Weight: 290 lbs, BMI: 41.6, Temperature: 98.3 F, Pulse: 68 bpm, Respiratory Rate: 16 breaths/min, Blood Pressure: 113/72 mmHg. General Notes: Left lower extremity: To the posterior aspect there is an open wound with granulation tissue present. Larger from last clinic visit. Good edema  control. Integumentary (Hair, Skin) Wound #1 status is Open. Original cause of wound was Gradually Appeared. The date acquired was: 04/08/2020. The wound has been in treatment 20 weeks. The wound is located on the Left,Posterior Lower Leg. The wound measures 0.8cm  length x 0.8cm width x 0.1cm depth; 0.503cm^2 area and 0.05cm^3 volume. There is Fat Layer (Subcutaneous Tissue) exposed. There is no tunneling or undermining noted. There is a large amount of serosanguineous drainage noted. There is large (67-100%) red, pink granulation within the wound bed. There is no necrotic tissue within the wound bed. Shane Bean, Shane Bean ([id number]) Assessment Active Problems ICD-10 Type 2 diabetes mellitus with other skin ulcer Venous insufficiency (chronic) (peripheral) Non-pressure chronic ulcer of other part of left lower leg with fat layer exposed Essential (primary) hypertension Chronic venous hypertension (idiopathic) with ulcer and inflammation of right lower extremity Patient's wound is slightly larger from last clinic visit. It still has healthy granulation tissue present. I recommended switching the dressing from Hydrofera Blue to silver alginate. I would also like to add a small amount of gentamicin in case there is bioburden that has created a larger wound. No surrounding soft tissue infection. We will put this on under compression. Procedures Wound #1 Pre-procedure diagnosis of Wound #1 is a Diabetic Wound/Ulcer of the Lower Extremity located on the Left,Posterior Lower Leg . There was a Three Layer Compression Therapy Procedure by Donnamarie Poag, RN. Post procedure Diagnosis Wound #1: Same as Pre-Procedure Plan Follow-up Appointments: Return Appointment in 1 week. Nurse Visit as needed Bathing/ Shower/ Hygiene: Wound #1 Left,Posterior Lower Leg: May shower with wound dressing protected with water repellent cover or cast protector. - cover dressing during shower-do not get it wet No tub bath. Edema Control - Lymphedema / Segmental Compressive Device / Other: Optional: One layer of unna paste to top of compression wrap (to act as an anchor). Elevate, Exercise Daily and Avoid Standing for Long Periods of Time. Elevate legs to the level of the heart and  pump ankles as often as possible Elevate leg(s) parallel to the floor when sitting. DO YOUR BEST to sleep in the bed at night. DO NOT sleep in your recliner. Long hours of sitting in a recliner leads to swelling of the legs and/or potential wounds on your backside. Additional Orders / Instructions: Follow Nutritious Diet and Increase Protein Intake WOUND #1: - Lower Leg Wound Laterality: Left, Posterior Cleanser: Soap and Water 1 x Per Week/30 Days Discharge Instructions: Gently cleanse wound with antibacterial soap, rinse and pat dry prior to dressing wounds Topical: Gentamicin 1 x Per Week/30 Days Discharge Instructions: Apply as directed by provider. Primary Dressing: Silvercel Small 2x2 (in/in) 1 x Per Week/30 Days Discharge Instructions: Apply Silvercel Small 2x2 (in/in) as instructed Secondary Dressing: ABD Pad 5x9 (in/in) 1 x Per Week/30 Days Discharge Instructions: Cover with ABD pad Compression Wrap: Profore Lite LF 3 Multilayer Compression Bandaging System 1 x Per Week/30 Days Discharge Instructions: Apply 3 multi-layer wrap as prescribed. Compression Stockings: Circaid Juxta Lite Compression Wrap Compression Amount: 20-30 mmHg (left) Discharge Instructions: Apply Circaid Juxta Lite Compression Wrap as directed 1. Silver alginate with gentamicin under compression therapy 2. Follow-up in 1 week Electronic Signature(s) Shane Bean, Shane Bean ([id number]) Signed: 10/07/2020 3:08:51 PM By: Kalman Shan DO Entered By: Kalman Shan on 10/07/2020 15:07:32 Shane Bean ([id number]) -------------------------------------------------------------------------------- ROS/PFSH Details Patient Name: Shane Bean Date of Service: 10/07/2020 2:30 PM Medical Record Number: [id number] Patient Account Number: [id number] Date of Birth/Sex: 09/01/1955 (64 y.o. M)  Treating RN: Donnamarie Poag Primary Care Provider: Reeves Dam Other Clinician: Referring Provider: Reeves Dam Treating Provider/Extender: Yaakov Guthrie in Treatment: 20 Information Obtained From Patient Eyes Medical History: Negative for: Cataracts; Glaucoma; Optic Neuritis Ear/Nose/Mouth/Throat Medical History: Negative for: Chronic sinus problems/congestion Hematologic/Lymphatic Medical History: Negative for: Anemia; Hemophilia; Human Immunodeficiency Virus; Lymphedema Respiratory Medical History: Negative for: Aspiration; Asthma; Chronic Obstructive Pulmonary Disease (COPD); Pneumothorax; Sleep Apnea; Tuberculosis Cardiovascular Medical History: Positive for: Hypertension Negative for: Angina; Arrhythmia; Congestive Heart Failure; Coronary Artery Disease; Deep Vein Thrombosis; Hypotension; Myocardial Infarction; Peripheral Arterial Disease; Peripheral Venous Disease; Phlebitis; Vasculitis Gastrointestinal Medical History: Negative for: Cirrhosis ; Colitis; Crohnos; Hepatitis A; Hepatitis B Endocrine Medical History: Positive for: Type II Diabetes Negative for: Type I Diabetes Time with diabetes: 3 Treated with: Oral agents Blood sugar tested every day: No Genitourinary Medical History: Negative for: End Stage Renal Disease Immunological Medical History: Negative for: Lupus Erythematosus; Raynaudos; Scleroderma Integumentary (Skin) Medical History: Negative for: History of Burn; History of pressure wounds Shane Bean, Shane V. ([id number]) Musculoskeletal Medical History: Negative for: Gout; Rheumatoid Arthritis; Osteoarthritis; Osteomyelitis Neurologic Medical History: Positive for: Neuropathy Negative for: Dementia; Quadriplegia; Paraplegia; Seizure Disorder Oncologic Medical History: Negative for: Received Chemotherapy; Received Radiation Psychiatric Medical History: Negative for: Anorexia/bulimia; Confinement Anxiety Immunizations Pneumococcal Vaccine: Received Pneumococcal Vaccination: Yes Received Pneumococcal Vaccination On or After 60th  Birthday: No Implantable Devices None Family and Social History Never smoker; Marital Status - Single; Alcohol Use: Moderate; Drug Use: No History; Caffeine Use: Never; Financial Concerns: No; Food, Clothing or Shelter Needs: No; Support System Lacking: No; Transportation Concerns: No Electronic Signature(s) Signed: 10/07/2020 3:06:25 PM By: Donnamarie Poag Signed: 10/07/2020 3:08:51 PM By: Kalman Shan DO Entered By: Kalman Shan on 10/07/2020 15:05:34 Shane Bean ([id number]) -------------------------------------------------------------------------------- [place] Details Patient Name: Shane Bean Date of Service: 10/07/2020 Medical Record Number: [id number] Patient Account Number: [id number] Date of Birth/Sex: 04/14/1955 (64 y.o. M) Treating RN: Donnamarie Poag Primary Care Provider: Reeves Dam Other Clinician: Referring Provider: Reeves Dam Treating Provider/Extender: Yaakov Guthrie in Treatment: 20 Diagnosis Coding ICD-10 Codes Code Description E11.622 Type 2 diabetes mellitus with other skin ulcer I87.2 Venous insufficiency (chronic) (peripheral) 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 layer exposed I10 Essential (primary) hypertension I87.331 Chronic venous hypertension (idiopathic) with ulcer and inflammation of right lower extremity Facility Procedures CPT4 Code: 33825053 Description: (Facility Use Only) [id number] - [facility] LT LEG Modifier: Quantity: 1 Physician Procedures CPT4 Code Description: 9379024 [id number] - WC PHYS LEVEL 3 - EST PT Modifier: Quantity: 1 CPT4 Code Description: ICD-10 Diagnosis Description I87.331 Chronic venous hypertension (idiopathic) with ulcer and inflammation of 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 lay E11.622 Type 2 diabetes mellitus with  other skin ulcer I87.2 Venous insufficiency (chronic) (peripheral) Modifier: right lower  extremit er exposed Quantity: y Engineer, maintenance) Signed: 10/07/2020 3:08:51 PM By: Kalman Shan DO Previous Signature: 10/07/2020 3:06:25 PM Version By: Donnamarie Poag Entered By: Kalman Shan on 10/07/2020 15:08:09

## 2020-10-14 ENCOUNTER — Other Ambulatory Visit: Payer: Self-pay

## 2020-10-14 ENCOUNTER — Encounter (HOSPITAL_BASED_OUTPATIENT_CLINIC_OR_DEPARTMENT_OTHER): Payer: Medicare (Managed Care) | Admitting: Internal Medicine

## 2020-10-14 DIAGNOSIS — E11622 Type 2 diabetes mellitus with other skin ulcer: Secondary | ICD-10-CM

## 2020-10-14 DIAGNOSIS — I872 Venous insufficiency (chronic) (peripheral): Secondary | ICD-10-CM | POA: Diagnosis not present

## 2020-10-14 DIAGNOSIS — L97822 Non-pressure chronic ulcer of other part of left lower leg with fat layer exposed: Secondary | ICD-10-CM

## 2020-10-14 DIAGNOSIS — I10 Essential (primary) hypertension: Secondary | ICD-10-CM | POA: Diagnosis not present

## 2020-10-14 NOTE — Progress Notes (Addendum)
SAVVAS, ROPER ([id number]) Visit Report for 10/14/2020 Arrival Information Details Patient Name: Shane Bean, Shane Bean Date of Service: 10/14/2020 2:30 PM Medical Record Number: [id number] Patient Account Number: [id number] Date of Birth/Sex: [birth year] (64 y.o. M) Treating RN: Donnamarie Poag Primary Care Tyreck Bell: Reeves Dam Other Clinician: Referring Bess Saltzman: Reeves Dam Treating Kaliel Bolds/Extender: Yaakov Guthrie in Treatment: 21 Visit Information History Since Last Visit Added or deleted any medications: No Patient Arrived: Ambulatory Had a fall or experienced change in No Arrival Time: 14:33 activities of daily living that may affect Accompanied By: self risk of falls: Transfer Assistance: None Hospitalized since last visit: No Patient Identification Verified: Yes Has Dressing in Place as Prescribed: Yes Secondary Verification Process Completed: Yes Has Compression in Place as Prescribed: Yes Patient Requires Transmission-Based No Pain Present Now: No Precautions: Patient Has Alerts: Yes Patient Alerts: Patient on Blood Thinner 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 M) Treating RN: Donnamarie Poag Primary Care Josiah Wojtaszek: Reeves Dam Other Clinician: Referring Tylon Kemmerling: Reeves Dam Treating Srinika Delone/Extender: Yaakov Guthrie in Treatment: 21 Clinic Level of Care Assessment Items TOOL 4 Quantity Score []  - Use when only an EandM is performed on FOLLOW-UP visit 0 ASSESSMENTS - Nursing Assessment /  Reassessment []  - Reassessment of Co-morbidities (includes updates in patient status) 0 []  - 0 Reassessment of Adherence to Treatment Plan ASSESSMENTS - Wound and Skin Assessment / Reassessment X - Simple Wound Assessment / Reassessment - one wound 1 5 []  - 0 Complex Wound Assessment / Reassessment - multiple wounds []  - 0 Dermatologic / Skin Assessment (not related to wound area) ASSESSMENTS - Focused Assessment []  - Circumferential Edema Measurements - multi extremities 0 []  - 0 Nutritional Assessment / Counseling / Intervention []  - 0 Lower Extremity Assessment (monofilament, tuning fork, pulses) []  - 0 Peripheral Arterial Disease Assessment (using hand held doppler) ASSESSMENTS - Ostomy and/or Continence Assessment and Care []  - Incontinence Assessment and Management 0 []  - 0 Ostomy Care Assessment and Management (repouching, etc.) PROCESS - Coordination of Care X - Simple Patient / Family Education for ongoing care 1 15 []  - 0 Complex (extensive) Patient / Family Education for ongoing care []  - 0 Staff obtains Programmer, systems, Records, Test Results / Process Orders []  - 0 Staff telephones HHA, Nursing Homes / Clarify orders / etc []  - 0 Routine Transfer to another Facility (non-emergent condition) []  - 0 Routine Hospital Admission (non-emergent condition) []  - 0 New Admissions / Biomedical engineer / Ordering NPWT, Apligraf, etc. []  - 0 Emergency Hospital Admission (emergent condition) X- 1 10 Simple Discharge Coordination []  - 0 Complex (extensive) Discharge Coordination PROCESS - Special Needs []  - Pediatric / Minor Patient Management 0 []  - 0 Isolation Patient Management []  - 0 Hearing / Language / Visual special needs []  - 0 Assessment of Community assistance (transportation, D/C planning, etc.) []  - 0 Additional assistance / Altered mentation []  - 0 Support Surface(s) Assessment (bed, cushion, seat, etc.) INTERVENTIONS - Wound Cleansing /  Measurement Wion, Mana V. ([id number]) X- 1 5 Simple Wound Cleansing - one wound []  - 0 Complex Wound Cleansing - multiple wounds X- 1 5 Wound Imaging (photographs - any number of wounds) []  - 0  Wound Tracing (instead of photographs) X- 1 5 Simple Wound Measurement - one wound []  - 0 Complex Wound Measurement - multiple wounds INTERVENTIONS - Wound Dressings []  - Small Wound Dressing one or multiple wounds 0 []  - 0 Medium Wound Dressing one or multiple wounds []  - 0 Large Wound Dressing one or multiple wounds []  - 0 Application of Medications - topical []  - 0 Application of Medications - injection INTERVENTIONS - Miscellaneous []  - External ear exam 0 []  - 0 Specimen Collection (cultures, biopsies, blood, body fluids, etc.) []  - 0 Specimen(s) / Culture(s) sent or taken to Lab for analysis []  - 0 Patient Transfer (multiple staff / Civil Service fast streamer / Similar devices) []  - 0 Simple Staple / Suture removal (25 or less) []  - 0 Complex Staple / Suture removal (26 or more) []  - 0 Hypo / Hyperglycemic Management (close monitor of Blood Glucose) []  - 0 Ankle / Brachial Index (ABI) - do not check if billed separately X- 1 5 Vital Signs Has the patient been seen at the hospital within the last three years: Yes Total Score: 50 Level Of Care: New/Established - Level 2 Electronic Signature(s) Signed: 10/14/2020 3:03:55 PM By: Donnamarie Poag Entered By: Donnamarie Poag on 10/14/2020 15:02:36 Shane Bean ([id number]) -------------------------------------------------------------------------------- Encounter Discharge Information Details Patient Name: Shane Bean Date of Service: 10/14/2020 2:30 PM Medical Record Number: [id number] Patient Account Number: [id number] Date of Birth/Sex: 1955/09/24 (64 y.o. M) Treating RN: Donnamarie Poag Primary Care Sherika Kubicki: Reeves Dam Other Clinician: Referring Shardai Star: Reeves Dam Treating Yuktha Kerchner/Extender: Yaakov Guthrie  in Treatment: 21 Encounter Discharge Information Items Discharge Condition: Stable Ambulatory Status: Ambulatory Discharge Destination: Home Transportation: Private Auto Accompanied By: self Schedule Follow-up Appointment: Yes Clinical Summary of Care: Electronic Signature(s) Signed: 10/14/2020 3:03:55 PM By: Donnamarie Poag Entered By: Donnamarie Poag on 10/14/2020 15:03:36 Shane Bean ([id number]) -------------------------------------------------------------------------------- Lower Extremity Assessment Details Patient Name: Shane Bean Date of Service: 10/14/2020 2:30 PM Medical Record Number: [id number] Patient Account Number: [id number] Date of Birth/Sex: 11-07-1955 (64 y.o. M) Treating RN: Donnamarie Poag Primary Care Wylie Russon: Reeves Dam Other Clinician: Referring Georgann Bramble: Reeves Dam Treating Tyrese Ficek/Extender: Yaakov Guthrie in Treatment: 21 Edema Assessment Assessed: [Left: Yes] [Right: No] Edema: [Left: N] [Right: o] Calf Left: Right: Point of Measurement: 34 cm From Medial Instep 40.5 cm Ankle Left: Right: Point of Measurement: 10 cm From Medial Instep 24.5 cm Vascular Assessment Pulses: Dorsalis Pedis Palpable: [Left:Yes] Electronic Signature(s) Signed: 10/14/2020 3:03:55 PM By: Donnamarie Poag Entered By: Donnamarie Poag on 10/14/2020 14:41:59 Shane Bean ([id number]) -------------------------------------------------------------------------------- Multi Wound Chart Details Patient Name: Shane Bean Date of Service: 10/14/2020 2:30 PM Medical Record Number: [id number] Patient Account Number: [id number] Date of Birth/Sex: 10-29-1955 (64 y.o. M) Treating RN: Donnamarie Poag Primary Care Yeslin Delio: Reeves Dam Other Clinician: Referring Jasmon Graffam: Reeves Dam Treating Jamarius Saha/Extender: Yaakov Guthrie in Treatment: 21 Vital Signs Height(in): 70 Pulse(bpm): 66 Weight(lbs): 290 Blood Pressure(mmHg): 132/73 Body Mass  Index(BMI): 42 Temperature(F): 98.3 Respiratory Rate(breaths/min): 16 Photos: [N/A:N/A] Wound Location: Left, Posterior Lower Leg N/A N/A Wounding Event: Gradually Appeared N/A N/A Primary Etiology: Diabetic Wound/Ulcer of the Lower N/A N/A Extremity Comorbid History: Hypertension, Type II Diabetes, N/A N/A Neuropathy Date Acquired: 04/08/2020 N/A N/A Weeks of Treatment: 21 N/A N/A Wound Status: Open N/A N/A Measurements L x W x D (cm) 0.1x0.1x0.1 N/A N/A Area (cm) : 0.008 N/A N/A Volume (cm) : 0.001 N/A N/A % Reduction in Area: 99.90% N/A N/A % Reduction in Volume: 99.90% N/A N/A  Classification: Grade 2 N/A N/A Exudate Amount: None Present N/A N/A Granulation Amount: None Present (0%) N/A N/A Necrotic Amount: None Present (0%) N/A N/A Exposed Structures: Fascia: No N/A N/A Fat Layer (Subcutaneous Tissue): No Tendon: No Muscle: No Joint: No Bone: No Epithelialization: Medium (34-66%) N/A N/A Treatment Notes Wound #1 (Lower Leg) Wound Laterality: Left, Posterior Cleanser Peri-Wound Care Topical Primary Dressing Secondary Dressing Secured With Shane Bean, Shane Bean ([id number]) Compression Wrap Compression Stockings Add-Ons Electronic Signature(s) Signed: 10/14/2020 3:17:43 PM By: Kalman Shan DO Previous Signature: 10/14/2020 3:03:55 PM Version By: Donnamarie Poag Entered By: Kalman Shan on 10/14/2020 15:13:38 Shane Bean ([id number]) -------------------------------------------------------------------------------- Multi-Disciplinary Care Plan Details Patient Name: Shane Bean Date of Service: 10/14/2020 2:30 PM Medical Record Number: [id number] Patient Account Number: [id number] Date of Birth/Sex: 10-01-1955 (64 y.o. M) Treating RN: Donnamarie Poag Primary Care Pasty Manninen: Reeves Dam Other Clinician: Referring Jailyn Langhorst: Reeves Dam Treating Jachin Coury/Extender: Yaakov Guthrie in Treatment: 21 Active Inactive Electronic  Signature(s) Signed: 10/14/2020 3:03:55 PM By: Donnamarie Poag Entered By: Donnamarie Poag on 10/14/2020 14:42:33 Shane Bean ([id number]) -------------------------------------------------------------------------------- Pain Assessment Details Patient Name: Shane Bean Date of Service: 10/14/2020 2:30 PM Medical Record Number: [id number] Patient Account Number: [id number] Date of Birth/Sex: 08-24-1955 (64 y.o. M) Treating RN: Donnamarie Poag Primary Care Almas Rake: Reeves Dam Other Clinician: Referring Malachi Kinzler: Reeves Dam Treating Khalen Styer/Extender: Yaakov Guthrie in Treatment: 21 Active Problems Location of Pain Severity and Description of Pain Patient Has Paino No Site Locations Rate the pain. Current Pain Level: 0 Pain Management and Medication Current Pain Management: Electronic Signature(s) Signed: 10/14/2020 3:03:55 PM By: Donnamarie Poag Entered By: Donnamarie Poag on 10/14/2020 14:39:31 Shane Bean ([id number]) -------------------------------------------------------------------------------- Patient/Caregiver Education Details Patient Name: Shane Bean Date of Service: 10/14/2020 2:30 PM Medical Record Number: [id number] Patient Account Number: [id number] Date of Birth/Gender: June 16, 1955 (64 y.o. M) Treating RN: Donnamarie Poag Primary Care Physician: Reeves Dam Other Clinician: Referring Physician: Reeves Dam Treating Physician/Extender: Yaakov Guthrie in Treatment: 21 Education Assessment Education Provided To: Patient Education Topics Provided Basic Hygiene: Venous: Electronic Signature(s) Signed: 10/14/2020 3:03:55 PM By: Donnamarie Poag Entered By: Donnamarie Poag on 10/14/2020 15:02:55 Shane Bean ([id number]) -------------------------------------------------------------------------------- Wound Assessment Details Patient Name: Shane Bean Date of Service: 10/14/2020 2:30 PM Medical Record Number: [id number] Patient  Account Number: [id number] Date of Birth/Sex: 1955-01-14 (64 y.o. M) Treating RN: Donnamarie Poag Primary Care Clarene Curran: Reeves Dam Other Clinician: Referring Jasmon Graffam: Reeves Dam Treating Keondria Siever/Extender: Yaakov Guthrie in Treatment: 21 Wound Status Wound Number: 1 Primary Etiology: Diabetic Wound/Ulcer of the Lower Extremity Wound Location: Left, Posterior Lower Leg Wound Status: Healed - Epithelialized Wounding Event: Gradually Appeared Comorbid History: Hypertension, Type II Diabetes, Neuropathy Date Acquired: 04/08/2020 Weeks Of Treatment: 21 Clustered Wound: No Photos Wound Measurements Length: (cm) 0 % Re Width: (cm) 0 % Re Depth: (cm) 0 Epit Area: (cm) 0 Volume: (cm) 0 duction in Area: 100% duction in Volume: 100% helialization: Medium (34-66%) Wound Description Classification: Grade 2 Fou Exudate Amount: None Present Slo l Odor After Cleansing: No ugh/Fibrino No Wound Bed Granulation Amount: None Present (0%) Exposed Structure Necrotic Amount: None Present (0%) Fascia Exposed: No Fat Layer (Subcutaneous Tissue) Exposed: No Tendon Exposed: No Muscle Exposed: No Joint Exposed: No Bone Exposed: No Treatment Notes Wound #1 (Lower Leg) Wound Laterality: Left, Posterior Cleanser Peri-Wound Care Topical Primary Dressing Shane Bean, Shane Bean ([id number]) Secondary Dressing Secured With Compression Wrap Compression Stockings Add-Ons Electronic Signature(s) Signed: 10/15/2020 9:22:34 AM By: Donnamarie Poag Previous Signature: 10/14/2020  3:03:55 PM Version By: Donnamarie Poag Entered By: Donnamarie Poag on 10/14/2020 15:54:45 Shane Bean ([id number]) -------------------------------------------------------------------------------- [place] Details Patient Name: Shane Bean Date of Service: 10/14/2020 2:30 PM Medical Record Number: [id number] Patient Account Number: [id number] Date of Birth/Sex: Aug 16, 1955 (64 y.o. M) Treating RN: Donnamarie Poag Primary Care Lestine Rahe: Reeves Dam Other Clinician: Referring Miyah Hampshire: Reeves Dam Treating Pinchos Topel/Extender: Yaakov Guthrie in Treatment: 21 Vital Signs Time Taken: 14:37 Temperature (F): 98.3 Height (in): 70 Pulse (bpm): 66 Weight (lbs): 290 Respiratory Rate (breaths/min): 16 Body Mass Index (BMI): 41.6 Blood Pressure (mmHg): 132/73 Reference Range: 80 - 120 mg / dl Electronic Signature(s) Signed: 10/14/2020 3:03:55 PM By: Donnamarie Poag Entered ByDonnamarie Poag on 10/14/2020 14:39:20

## 2020-10-14 NOTE — Progress Notes (Signed)
ESLI, JERNIGAN ([id number]) Visit Report for 10/14/2020 Chief Complaint Document Details Patient Name: Shane Bean, Shane Bean Date of Service: 10/14/2020 2:30 PM Medical Record Number: [id number] Patient Account Number: [id number] Date of Birth/Sex: 1955/05/09 (65 y.o. M) Treating RN: Donnamarie Poag Primary Care Provider: Reeves Dam Other Clinician: Referring Provider: Reeves Dam Treating Provider/Extender: Yaakov Guthrie in Treatment: 21 Information Obtained from: Patient Chief Complaint Left LE Ulcer Electronic Signature(s) Signed: 10/14/2020 3:17:43 PM By: Kalman Shan DO Entered By: Kalman Shan on 10/14/2020 15:13:46 Shane Bean ([id number]) -------------------------------------------------------------------------------- HPI Details Patient Name: Shane Bean Date of Service: 10/14/2020 2:30 PM Medical Record Number: [id number] Patient Account Number: [id number] Date of Birth/Sex: [birth year] (65 y.o. M) Treating RN: Donnamarie Poag Primary Care Provider: Reeves Dam Other Clinician: Referring Provider: Reeves Dam Treating Provider/Extender: Yaakov Guthrie in Treatment: 21 History of Present Illness HPI Description: 05/18/2020 upon evaluation today patient appears for initial evaluation here in clinic concerning issues that he has been having with the wound on his left lateral leg. Fortunately there does not appear to be any signs of obvious an active infection at this time which is great news. With that being said the patient unfortunately is continued to have issues with pain although he tells me it is gotten a lot better. He was on Keflex as well as Bactrim DS which seems to have done a good job there. His most recent hemoglobin A1c was 9.8 that was on 04/14/2020. Subsequently I do feel like that the patient is making progress here. He does have a history of chronic venous insufficiency as well as hypertension. I do not see any need for  antibiotics at this point. 5/25; follow-up of the wound on the left posterior calf. This is completely necrotic on the surface. It does not look infected but it is painful. He is a diabetic but I think there is some suggestion here of chronic venous disease as well. We used Iodoflex under compression last week 6/1; again a completely necrotic surface on this with the wound. We have been using Iodoflex under compression he complains of gnawing pain. He has had wounds previously a lot of this looks like chronic venous disease. 6/8; about two thirds of the surface of this wound is still covered in a black necrotic eschar. I changed him from Iodoflex to [place] last week because of complaints of pain. He actually was seen by her neurologist this weeko Peripheral neuropathy as a cause of the pain and they changed him from Neurontin to Lyrica but he still has not had any relief. He says that most of the pain however is in his heel not in his wound per se. He is a diabetic 6/15. This is a patient with what looks to be a venous wound on the left lateral lower leg. He is also a diabetic. Currently being worked up for diabetic neuropathy. He complains of pain out of proportion to the size of the wound although to be fair the entire area here was eschared. We have been working to get a viable surface. We are using Sorbact 6/22; fairly painful wound on the left posterior calf. I am assuming this is been venous he is also a diabetic. His ABI in our clinic was noncompressible however he has easily palpable pulses on his feet. He complains of a lot of pain in the wound also of the left heel and the upper left calf. Some of this may be neuropathy. It is led me to discontinue Iodoflex reduce  his compression but he still complains of pain in fact he says he could not take a debridement today. When I first saw this it was completely necrotic surface we have got it down to something that looks a reasonably healthy I  have been wondering about biopsying this area however he is on Coumadin and with the pain I have put this off. The major question would be an inflammatory ulcer such as pyoderma. The patient is not aware of how this started. He does however have a wound history on both legs. 6/29; patient presents for 1 week follow-up. He has been using Hydrofera Blue under Kerlix/Coban. He has tolerated this well. He currently denies signs of infection. He also declines debridement today. He he states it is painful and does not want to have this done. 7/6; Patient presents for 1 week follow-up. He has been using Hydrofera Blue under Kerlix/Coban. He denies signs of infection. He declines debridement today. 7/15; patient presents for 1 week follow-up. He has been using Hydrofera Blue under Kerlix/Coban. He denies signs of infection today. He is agreeable to having debridement done today. 7/20; patient presents for 1 week follow-up. He is in good spirits today. He has been using Hydrofera Blue under Kerlix/Coban. He denies signs of infection today. 7/27; patient presents for 1 week follow-up. He is in good spirits today. He has no issues or complaints today. He denies signs of infection. 08/12/2020 upon evaluation today patient appears to be doing better in regard to his wound. He has been tolerating the dressing changes without complication. With that being said he did have arterial studies and does show that he has a TBI on the right of 0.90 and a TBI on the left of 0.76 which is excellent. There is no evidence of arterial compromise at this point. 8/17; patient presents for 1 week follow-up. He has no issues or complaints today. He denies signs of infection. 8/24; patient presents for 1 week follow-up. He has no issues or complaints today. He denies signs of infection. He continues to tolerate the compression wrap well. 8/31; patient presents for 1 week follow-up. He has no issues or complaints today. He denies  signs of infection. He is in good spirits today. 9/7; patient presents for 1 week follow-up. He is tolerated the compression wrap well. He has no issues or complaints today. He denies signs of infection. 9/14; patient presents for 1 week follow-up. He has no issues or complaints today. He denies signs of infection. He is in excellent spirits today. 9/21; patient presents for 1 week follow-up. He has no issues or complaints today. 9/28; patient presents for 1 week follow-up. He has tolerated the compression wrap well. He has no issues or complaints today. 10/5; patient presents for 1 week follow-up. He has no issues or complaints today. 10/12; patient presents for 1 week follow-up. He has no issues or complaints today. He denies signs of infection. Shane Bean, Shane Bean ([id number]) Electronic Signature(s) Signed: 10/14/2020 3:17:43 PM By: Kalman Shan DO Entered By: Kalman Shan on 10/14/2020 15:14:06 Shane Bean ([id number]) -------------------------------------------------------------------------------- Physical Exam Details Patient Name: Shane Bean Date of Service: 10/14/2020 2:30 PM Medical Record Number: [id number] Patient Account Number: [id number] Date of Birth/Sex: February 16, 1955 (65 y.o. M) Treating RN: Donnamarie Poag Primary Care Provider: Reeves Dam Other Clinician: Referring Provider: Reeves Dam Treating Provider/Extender: Yaakov Guthrie in Treatment: 21 Constitutional . Cardiovascular . Psychiatric . Notes Left lower extremity: To the posterior aspect there is epithelialization to previous wound  site. Good edema control. Electronic Signature(s) Signed: 10/14/2020 3:17:43 PM By: Kalman Shan DO Entered By: Kalman Shan on 10/14/2020 15:14:41 Shane Bean ([id number]) -------------------------------------------------------------------------------- Physician Orders Details Patient Name: Shane Bean Date of Service:  10/14/2020 2:30 PM Medical Record Number: [id number] Patient Account Number: [id number] Date of Birth/Sex: Jun 25, 1955 (65 y.o. M) Treating RN: Donnamarie Poag Primary Care Provider: Reeves Dam Other Clinician: Referring Provider: Reeves Dam Treating Provider/Extender: Yaakov Guthrie in Treatment: 89 Verbal / Phone Orders: No Diagnosis Coding Discharge From [facility] Services o Discharge from [place] Treatment Complete o Wear compression garments daily. Put garments on first thing when you wake up and remove them before bed. o Moisturize legs daily after removing compression garments. o DO YOUR BEST to sleep in the bed at night. DO NOT sleep in your recliner. Long hours of sitting in a recliner leads to swelling of the legs and/or potential wounds on your backside. Additional Orders / Instructions o Other: - Please protect your fragile healed skin Wound Treatment Electronic Signature(s) Signed: 10/14/2020 3:03:55 PM By: Donnamarie Poag Signed: 10/14/2020 3:17:43 PM By: Kalman Shan DO Entered By: Donnamarie Poag on 10/14/2020 15:02:08 Shane Bean ([id number]) -------------------------------------------------------------------------------- Problem List Details Patient Name: Shane Bean Date of Service: 10/14/2020 2:30 PM Medical Record Number: [id number] Patient Account Number: [id number] Date of Birth/Sex: [birth year] (65 y.o. M) Treating RN: Donnamarie Poag Primary Care Provider: Reeves Dam Other Clinician: Referring Provider: Reeves Dam Treating Provider/Extender: Yaakov Guthrie in Treatment: 21 Active Problems ICD-10 Encounter Code Description Active Date MDM Diagnosis E11.622 Type 2 diabetes mellitus with other skin ulcer 05/18/2020 No Yes I87.2 Venous insufficiency (chronic) (peripheral) 05/18/2020 No Yes 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 layer 05/18/2020 No Yes exposed [place]  (primary) hypertension 05/18/2020 No Yes I87.331 Chronic venous hypertension (idiopathic) with ulcer and inflammation of 08/26/2020 No Yes right lower extremity Inactive Problems Resolved Problems Electronic Signature(s) Signed: 10/14/2020 3:17:43 PM By: Kalman Shan DO Entered By: Kalman Shan on 10/14/2020 15:13:31 Shane Bean ([id number]) -------------------------------------------------------------------------------- Progress Note Details Patient Name: Shane Bean Date of Service: 10/14/2020 2:30 PM Medical Record Number: [id number] Patient Account Number: [id number] Date of Birth/Sex: April 02, 1955 (65 y.o. M) Treating RN: Donnamarie Poag Primary Care Provider: Reeves Dam Other Clinician: Referring Provider: Reeves Dam Treating Provider/Extender: Yaakov Guthrie in Treatment: 21 Subjective Chief Complaint Information obtained from Patient Left LE Ulcer History of Present Illness (HPI) 05/18/2020 upon evaluation today patient appears for initial evaluation here in clinic concerning issues that he has been having with the wound on his left lateral leg. Fortunately there does not appear to be any signs of obvious an active infection at this time which is great news. With that being said the patient unfortunately is continued to have issues with pain although he tells me it is gotten a lot better. He was on Keflex as well as Bactrim DS which seems to have done a good job there. His most recent hemoglobin A1c was 9.8 that was on 04/14/2020. Subsequently I do feel like that the patient is making progress here. He does have a history of chronic venous insufficiency as well as hypertension. I do not see any need for antibiotics at this point. 5/25; follow-up of the wound on the left posterior calf. This is completely necrotic on the surface. It does not look infected but it is painful. He is a diabetic but I think there is some suggestion here of chronic venous  disease as well. We used Iodoflex under compression last week 6/1; again a completely necrotic surface on this with the wound. We have been using Iodoflex under compression he complains of gnawing pain. He has had wounds previously a lot of this looks like chronic venous disease. 6/8; about two thirds of the surface of this wound is still covered in a black necrotic eschar. I changed him from Iodoflex to [place] last week because of complaints of pain. He actually was seen by her neurologist this weeko Peripheral neuropathy as a cause of the pain and they changed him from Neurontin to Lyrica but he still has not had any relief. He says that most of the pain however is in his heel not in his wound per se. He is a diabetic 6/15. This is a patient with what looks to be a venous wound on the left lateral lower leg. He is also a diabetic. Currently being worked up for diabetic neuropathy. He complains of pain out of proportion to the size of the wound although to be fair the entire area here was eschared. We have been working to get a viable surface. We are using Sorbact 6/22; fairly painful wound on the left posterior calf. I am assuming this is been venous he is also a diabetic. His ABI in our clinic was noncompressible however he has easily palpable pulses on his feet. He complains of a lot of pain in the wound also of the left heel and the upper left calf. Some of this may be neuropathy. It is led me to discontinue Iodoflex reduce his compression but he still complains of pain in fact he says he could not take a debridement today. When I first saw this it was completely necrotic surface we have got it down to something that looks a reasonably healthy I have been wondering about biopsying this area however he is on Coumadin and with the pain I have put this off. The major question would be an inflammatory ulcer such as pyoderma. The patient is not aware of how this started. He does however have a wound  history on both legs. 6/29; patient presents for 1 week follow-up. He has been using Hydrofera Blue under Kerlix/Coban. He has tolerated this well. He currently denies signs of infection. He also declines debridement today. He he states it is painful and does not want to have this done. 7/6; Patient presents for 1 week follow-up. He has been using Hydrofera Blue under Kerlix/Coban. He denies signs of infection. He declines debridement today. 7/15; patient presents for 1 week follow-up. He has been using Hydrofera Blue under Kerlix/Coban. He denies signs of infection today. He is agreeable to having debridement done today. 7/20; patient presents for 1 week follow-up. He is in good spirits today. He has been using Hydrofera Blue under Kerlix/Coban. He denies signs of infection today. 7/27; patient presents for 1 week follow-up. He is in good spirits today. He has no issues or complaints today. He denies signs of infection. 08/12/2020 upon evaluation today patient appears to be doing better in regard to his wound. He has been tolerating the dressing changes without complication. With that being said he did have arterial studies and does show that he has a TBI on the right of 0.90 and a TBI on the left of 0.76 which is excellent. There is no evidence of arterial compromise at this point. 8/17; patient presents for 1 week follow-up. He has no issues or complaints today. He denies signs  of infection. 8/24; patient presents for 1 week follow-up. He has no issues or complaints today. He denies signs of infection. He continues to tolerate the compression wrap well. 8/31; patient presents for 1 week follow-up. He has no issues or complaints today. He denies signs of infection. He is in good spirits today. 9/7; patient presents for 1 week follow-up. He is tolerated the compression wrap well. He has no issues or complaints today. He denies signs of infection. 9/14; patient presents for 1 week follow-up. He has  no issues or complaints today. He denies signs of infection. He is in excellent spirits today. 9/21; patient presents for 1 week follow-up. He has no issues or complaints today. Shane Bean, Shane Bean ([id number]) 9/28; patient presents for 1 week follow-up. He has tolerated the compression wrap well. He has no issues or complaints today. 10/5; patient presents for 1 week follow-up. He has no issues or complaints today. 10/12; patient presents for 1 week follow-up. He has no issues or complaints today. He denies signs of infection. Patient History Information obtained from Patient. Social History Never smoker, Marital Status - Single, Alcohol Use - Moderate, Drug Use - No History, Caffeine Use - Never. Medical History Eyes Denies history of Cataracts, Glaucoma, Optic Neuritis Ear/Nose/Mouth/Throat Denies history of Chronic sinus problems/congestion Hematologic/Lymphatic Denies history of Anemia, Hemophilia, Human Immunodeficiency Virus, Lymphedema Respiratory Denies history of Aspiration, Asthma, Chronic Obstructive Pulmonary Disease (COPD), Pneumothorax, Sleep Apnea, Tuberculosis Cardiovascular Patient has history of Hypertension Denies history of Angina, Arrhythmia, Congestive Heart Failure, Coronary Artery Disease, Deep Vein Thrombosis, Hypotension, Myocardial Infarction, Peripheral Arterial Disease, Peripheral Venous Disease, Phlebitis, Vasculitis Gastrointestinal Denies history of Cirrhosis , Colitis, Crohn s, Hepatitis A, Hepatitis B Endocrine Patient has history of Type II Diabetes Denies history of Type I Diabetes Genitourinary Denies history of End Stage Renal Disease Immunological Denies history of Lupus Erythematosus, Raynaud s, Scleroderma Integumentary (Skin) Denies history of History of Burn, History of pressure wounds Musculoskeletal Denies history of Gout, Rheumatoid Arthritis, Osteoarthritis, Osteomyelitis Neurologic Patient has history of Neuropathy Denies history  of Dementia, Quadriplegia, Paraplegia, Seizure Disorder Oncologic Denies history of Received Chemotherapy, Received Radiation Psychiatric Denies history of Anorexia/bulimia, Confinement Anxiety Objective Constitutional Vitals Time Taken: 2:37 PM, Height: 70 in, Weight: 290 lbs, BMI: 41.6, Temperature: 98.3 F, Pulse: 66 bpm, Respiratory Rate: 16 breaths/min, Blood Pressure: 132/73 mmHg. General Notes: Left lower extremity: To the posterior aspect there is epithelialization to previous wound site. Good edema control. Integumentary (Hair, Skin) Wound #1 status is Open. Original cause of wound was Gradually Appeared. The date acquired was: 04/08/2020. The wound has been in treatment 21 weeks. The wound is located on the Left,Posterior Lower Leg. The wound measures 0.1cm length x 0.1cm width x 0.1cm depth; 0.008cm^2 area and 0.001cm^3 volume. There is a none present amount of drainage noted. There is no granulation within the wound bed. There is no necrotic tissue within the wound bed. Shane Bean, Shane Bean ([id number]) Assessment Active Problems ICD-10 Type 2 diabetes mellitus with other skin ulcer Venous insufficiency (chronic) (peripheral) Non-pressure chronic ulcer of other part of left lower leg with fat layer exposed Essential (primary) hypertension Chronic venous hypertension (idiopathic) with ulcer and inflammation of right lower extremity Patient has done well with compression therapy and Hydrofera Blue. His wound is healed today. Surrounding skin is intact. I recommended keeping this area protected. He knows to call with any questions or concerns and can follow-up as needed. Plan Discharge From [facility] Services: Discharge from [place] Treatment Complete  Wear compression garments daily. Put garments on first thing when you wake up and remove them before bed. Moisturize legs daily after removing compression garments. DO YOUR BEST to sleep in the bed at night. DO NOT sleep in your  recliner. Long hours of sitting in a recliner leads to swelling of the legs and/or potential wounds on your backside. Additional Orders / Instructions: Other: - Please protect your fragile healed skin 1. Follow-up as needed 2. Discharge from our clinic due to closed wounds. Electronic Signature(s) Signed: 10/14/2020 3:17:43 PM By: Kalman Shan DO Entered By: Kalman Shan on 10/14/2020 15:16:42 Shane Bean ([id number]) -------------------------------------------------------------------------------- ROS/PFSH Details Patient Name: Shane Bean Date of Service: 10/14/2020 2:30 PM Medical Record Number: [id number] Patient Account Number: [id number] Date of Birth/Sex: Oct 21, 1955 (65 y.o. M) Treating RN: Donnamarie Poag Primary Care Provider: Reeves Dam Other Clinician: Referring Provider: Reeves Dam Treating Provider/Extender: Yaakov Guthrie in Treatment: 21 Information Obtained From Patient Eyes Medical History: Negative for: Cataracts; Glaucoma; Optic Neuritis Ear/Nose/Mouth/Throat Medical History: Negative for: Chronic sinus problems/congestion Hematologic/Lymphatic Medical History: Negative for: Anemia; Hemophilia; Human Immunodeficiency Virus; Lymphedema Respiratory Medical History: Negative for: Aspiration; Asthma; Chronic Obstructive Pulmonary Disease (COPD); Pneumothorax; Sleep Apnea; Tuberculosis Cardiovascular Medical History: Positive for: Hypertension Negative for: Angina; Arrhythmia; Congestive Heart Failure; Coronary Artery Disease; Deep Vein Thrombosis; Hypotension; Myocardial Infarction; Peripheral Arterial Disease; Peripheral Venous Disease; Phlebitis; Vasculitis Gastrointestinal Medical History: Negative for: Cirrhosis ; Colitis; Crohnos; Hepatitis A; Hepatitis B Endocrine Medical History: Positive for: Type II Diabetes Negative for: Type I Diabetes Time with diabetes: 3 Treated with: Oral agents Blood sugar tested every day:  No Genitourinary Medical History: Negative for: End Stage Renal Disease Immunological Medical History: Negative for: Lupus Erythematosus; Raynaudos; Scleroderma Integumentary (Skin) Medical History: Negative for: History of Burn; History of pressure wounds Devan, Joshuah V. ([id number]) Musculoskeletal Medical History: Negative for: Gout; Rheumatoid Arthritis; Osteoarthritis; Osteomyelitis Neurologic Medical History: Positive for: Neuropathy Negative for: Dementia; Quadriplegia; Paraplegia; Seizure Disorder Oncologic Medical History: Negative for: Received Chemotherapy; Received Radiation Psychiatric Medical History: Negative for: Anorexia/bulimia; Confinement Anxiety Immunizations Pneumococcal Vaccine: Received Pneumococcal Vaccination: Yes Received Pneumococcal Vaccination On or After 60th Birthday: No Implantable Devices None Family and Social History Never smoker; Marital Status - Single; Alcohol Use: Moderate; Drug Use: No History; Caffeine Use: Never; Financial Concerns: No; Food, Clothing or Shelter Needs: No; Support System Lacking: No; Transportation Concerns: No Electronic Signature(s) Signed: 10/14/2020 3:17:43 PM By: Kalman Shan DO Signed: 10/14/2020 3:53:36 PM By: Donnamarie Poag Entered By: Kalman Shan on 10/14/2020 15:14:13 Shane Bean ([id number]) -------------------------------------------------------------------------------- [place] Details Patient Name: Shane Bean Date of Service: 10/14/2020 Medical Record Number: [id number] Patient Account Number: [id number] Date of Birth/Sex: Nov 26, 1955 (65 y.o. M) Treating RN: Donnamarie Poag Primary Care Provider: Reeves Dam Other Clinician: Referring Provider: Reeves Dam Treating Provider/Extender: Yaakov Guthrie in Treatment: 21 Diagnosis Coding ICD-10 Codes Code Description E11.622 Type 2 diabetes mellitus with other skin ulcer I87.2 Venous insufficiency (chronic)  (peripheral) 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 layer exposed I10 Essential (primary) hypertension I87.331 Chronic venous hypertension (idiopathic) with ulcer and inflammation of right lower extremity Facility Procedures CPT4 Code: [id number] Description: [id number] - WOUND CARE VISIT-LEV 2 EST PT Modifier: Quantity: 1 Physician Procedures CPT4 Code: [id number] Description: 99213 - WC PHYS LEVEL 3 - EST PT Modifier: Quantity: 1 CPT4 Code: Description: ICD-10 Diagnosis Description E11.622 Type 2 diabetes mellitus with other skin ulcer I87.2 Venous insufficiency (chronic) (peripheral) L97.822 Non-pressure chronic ulcer of other  part of left lower leg with fat lay I10 Essential (primary)  hypertension Modifier: er exposed Quantity: Electronic Signature(s) Signed: 10/14/2020 3:17:43 PM By: Kalman Shan DO Previous Signature: 10/14/2020 3:03:55 PM Version By: Donnamarie Poag Entered By: Kalman Shan on 10/14/2020 15:17:27

## 2020-10-21 ENCOUNTER — Encounter: Payer: Medicare (Managed Care) | Admitting: Internal Medicine

## 2020-10-28 ENCOUNTER — Encounter: Payer: Medicare (Managed Care) | Admitting: Internal Medicine

## 2021-03-25 ENCOUNTER — Encounter: Payer: Self-pay | Admitting: Emergency Medicine

## 2021-03-25 ENCOUNTER — Other Ambulatory Visit: Payer: Self-pay

## 2021-03-25 ENCOUNTER — Emergency Department
Admission: EM | Admit: 2021-03-25 | Discharge: 2021-03-25 | Disposition: A | Discharge status: Home / Self Care | Payer: Medicare (Managed Care) | Attending: Emergency Medicine | Admitting: Emergency Medicine

## 2021-03-25 DIAGNOSIS — Z86718 Personal history of other venous thrombosis and embolism: Secondary | ICD-10-CM | POA: Diagnosis not present

## 2021-03-25 DIAGNOSIS — L03115 Cellulitis of right lower limb: Secondary | ICD-10-CM | POA: Diagnosis present

## 2021-03-25 DIAGNOSIS — L0390 Cellulitis, unspecified: Secondary | ICD-10-CM

## 2021-03-25 LAB — CBC WITH DIFFERENTIAL/PLATELET
Abs Immature Granulocytes: 0.04 10*3/uL (ref 0.00–0.07)
Basophils Absolute: 0 10*3/uL (ref 0.0–0.1)
Basophils Relative: 1 %
Eosinophils Absolute: 0.1 10*3/uL (ref 0.0–0.5)
Eosinophils Relative: 2 %
HCT: 36.9 % — ABNORMAL LOW (ref 39.0–52.0)
Hemoglobin: 11 g/dL — ABNORMAL LOW (ref 13.0–17.0)
Immature Granulocytes: 1 %
Lymphocytes Relative: 16 %
Lymphs Abs: 1 10*3/uL (ref 0.7–4.0)
MCH: 23.1 pg — ABNORMAL LOW (ref 26.0–34.0)
MCHC: 29.8 g/dL — ABNORMAL LOW (ref 30.0–36.0)
MCV: 77.5 fL — ABNORMAL LOW (ref 80.0–100.0)
Monocytes Absolute: 0.5 10*3/uL (ref 0.1–1.0)
Monocytes Relative: 8 %
Neutro Abs: 4.6 10*3/uL (ref 1.7–7.7)
Neutrophils Relative %: 72 %
Platelets: 274 10*3/uL (ref 150–400)
RBC: 4.76 MIL/uL (ref 4.22–5.81)
RDW: 17.1 % — ABNORMAL HIGH (ref 11.5–15.5)
WBC: 6.2 10*3/uL (ref 4.0–10.5)
nRBC: 0 % (ref 0.0–0.2)

## 2021-03-25 LAB — COMPREHENSIVE METABOLIC PANEL
ALT: 13 U/L (ref 0–44)
AST: 18 U/L (ref 15–41)
Albumin: 3.7 g/dL (ref 3.5–5.0)
Alkaline Phosphatase: 56 U/L (ref 38–126)
Anion gap: 10 (ref 5–15)
BUN: 13 mg/dL (ref 8–23)
CO2: 26 mmol/L (ref 22–32)
Calcium: 9.7 mg/dL (ref 8.9–10.3)
Chloride: 98 mmol/L (ref 98–111)
Creatinine, Ser: 1.02 mg/dL (ref 0.61–1.24)
GFR, Estimated: >60 mL/min (ref >60)
Glucose, Bld: 312 mg/dL — ABNORMAL HIGH (ref 70–99)
Potassium: 4.7 mmol/L (ref 3.5–5.1)
Sodium: 134 mmol/L — ABNORMAL LOW (ref 135–145)
Total Bilirubin: 0.6 mg/dL (ref 0.3–1.2)
Total Protein: 8.5 g/dL — ABNORMAL HIGH (ref 6.5–8.1)

## 2021-03-25 LAB — LACTIC ACID, PLASMA: Lactic Acid, Venous: 1.7 mmol/L (ref 0.5–1.9)

## 2021-03-25 LAB — PROTIME-INR
INR: 3.1 — ABNORMAL HIGH (ref 0.8–1.2)
Prothrombin Time: 32 seconds — ABNORMAL HIGH (ref 11.4–15.2)

## 2021-03-25 MED ORDER — TRAMADOL HCL 50 MG PO TABS: 50.0000 mg | ORAL_TABLET | Freq: Four times a day (QID) | ORAL | 0 refills | Status: DC | PRN

## 2021-03-25 MED ORDER — CEPHALEXIN 500 MG PO CAPS: 500.0000 mg | ORAL_CAPSULE | Freq: Four times a day (QID) | ORAL | 0 refills | Status: AC

## 2021-03-25 NOTE — Discharge Instructions (Addendum)
Please be sure to follow up with the wound care clinic. Please seek medical attention for any high fevers, chest pain, shortness of breath, change in behavior, persistent vomiting, bloody stool or any other new or concerning symptoms. ? ?

## 2021-03-25 NOTE — ED Triage Notes (Signed)
Pt here with right leg pain x3 weeks. Pt also had a wound to the back of the same leg that is not being cared for at the moment. Pt able to bear weight on the leg but states it is weaker than normal. Pt denies N/V/D. Pt stable in triage. ?

## 2021-03-25 NOTE — ED Provider Notes (Signed)
? ?[facility] ?Provider Note ? ? ? Event Date/Time  ? First MD Initiated Contact with Patient 03/25/21 1308   ?  (approximate) ? ? ?History  ? ?Leg Pain ? ? ?HPI ? ?Shane Bean is a 66 y.o. male  who , per outpatient office note dated 12/15/20 has history of aortic dissection repair, DVT, who presents to the emergency department today because of concern for wound to his right leg. The patient states that it started a couple of weeks ago. Initially started as a small nick but gradually has gotten worse. The patient says that he has had similar issues in the past. The area around the wound has been swollen and painful. He denies any fevers, nausea or vomiting.   ? ? ?Physical Exam  ? ?Triage Vital Signs: ?ED Triage Vitals  ?Enc Vitals Group  ?   BP 03/25/21 1248 128/82  ?   Pulse Rate 03/25/21 1248 72  ?   Resp 03/25/21 1248 18  ?   Temp 03/25/21 1248 98.4 ?F (36.9 ?C)  ?   Temp Source 03/25/21 1248 Oral  ?   SpO2 03/25/21 1248 98 %  ?   Weight 03/25/21 1250 279 lb 15.8 oz (127 kg)  ?   Height 03/25/21 1250 '5\' 10"'$  (1.778 m)  ?   Head Circumference --   ?   Peak Flow --   ?   Pain Score 03/25/21 1248 10  ? ?Most recent vital signs: ?Vitals:  ? 03/25/21 1248  ?BP: 128/82  ?Pulse: 72  ?Resp: 18  ?Temp: 98.4 ?F (36.9 ?C)  ?SpO2: 98%  ? ? ?General: Awake, no distress.  ?CV:  Good peripheral perfusion.  ?Resp:  Normal effort.  ?Abd:  No distention.  ?Skin:  Roughly 3 x 4 cm wound to lateral aspect of right lower leg, some surrounding erythema. No crepitus. No fluctuance.  ? ? ?ED Results / Procedures / Treatments  ? ?Labs ?(all labs ordered are listed, but only abnormal results are displayed) ?Labs Reviewed  ?COMPREHENSIVE METABOLIC PANEL - Abnormal; Notable for the following components:  ?    Result Value  ? Sodium 134 (*)   ? Glucose, Bld 312 (*)   ? Total Protein 8.5 (*)   ? All other components within normal limits  ?CBC WITH DIFFERENTIAL/PLATELET - Abnormal; Notable for the following  components:  ? Hemoglobin 11.0 (*)   ? HCT 36.9 (*)   ? MCV 77.5 (*)   ? MCH 23.1 (*)   ? MCHC 29.8 (*)   ? RDW 17.1 (*)   ? All other components within normal limits  ?PROTIME-INR - Abnormal; Notable for the following components:  ? Prothrombin Time 32.0 (*)   ? INR 3.1 (*)   ? All other components within normal limits  ?CULTURE, BLOOD (ROUTINE X 2)  ?CULTURE, BLOOD (ROUTINE X 2)  ?LACTIC ACID, PLASMA  ?LACTIC ACID, PLASMA  ?URINALYSIS, ROUTINE W REFLEX MICROSCOPIC  ? ? ? ?EKG ? ?None ? ? ?RADIOLOGY ?None ? ? ?PROCEDURES: ? ?Critical Care performed: No ? ?Procedures ? ? ?MEDICATIONS ORDERED IN ED: ?Medications - No data to display ? ? ?IMPRESSION / MDM / ASSESSMENT AND PLAN / ED COURSE  ?I reviewed the triage vital signs and the nursing notes. ?             ?               ? ?Differential diagnosis includes, but is not limited  to, cellulitis. ? ?Patient presents to the emergency department today because of concern for wound to his right lower leg. On exam patient does have a wound with surrounding erythema. At this time I do think cellulitis likely. No fevers. Blood sugar is elevated, which could be partly due to the infection, however no elevated anion gap. Did discuss possibility of obtaining x-ray with patient, however at this time I do have low suspicion for osteomyelitis. Patient felt comfortable deferring x-ray at this time which is reasonable. Will give patient prescription for antibiotics. Discussed importance of follow up with wound care clinic.  ? ? ?FINAL CLINICAL IMPRESSION(S) / ED DIAGNOSES  ? ?Final diagnoses:  ?Cellulitis, unspecified cellulitis site  ? ? ? ?Rx / DC Orders  ? ?ED Discharge Orders   ? ?      Ordered  ?  cephALEXin (KEFLEX) 500 MG capsule  4 times daily       ? 03/25/21 1424  ?  traMADol (ULTRAM) 50 MG tablet  Every 6 hours PRN       ? 03/25/21 1424  ? ?  ?  ? ?  ? ? ? ?Note:  This document was prepared using Dragon voice recognition software and may include unintentional dictation  errors. ? ?  ?Nance Pear, MD ?03/25/21 1431 ? ?

## 2021-03-30 LAB — CULTURE, BLOOD (ROUTINE X 2)
Culture: NO GROWTH
Special Requests: ADEQUATE

## 2021-04-08 ENCOUNTER — Other Ambulatory Visit
Admission: RE | Admit: 2021-04-08 | Discharge: 2021-04-08 | Disposition: A | Discharge status: Home / Self Care | Payer: Medicare (Managed Care) | Source: Ambulatory Visit | Attending: Physician Assistant | Admitting: Physician Assistant

## 2021-04-08 ENCOUNTER — Encounter: Payer: Medicare (Managed Care) | Attending: Physician Assistant | Admitting: Physician Assistant

## 2021-04-08 DIAGNOSIS — L97812 Non-pressure chronic ulcer of other part of right lower leg with fat layer exposed: Secondary | ICD-10-CM | POA: Diagnosis not present

## 2021-04-08 DIAGNOSIS — L97822 Non-pressure chronic ulcer of other part of left lower leg with fat layer exposed: Secondary | ICD-10-CM | POA: Diagnosis not present

## 2021-04-08 DIAGNOSIS — I10 Essential (primary) hypertension: Secondary | ICD-10-CM | POA: Diagnosis not present

## 2021-04-08 DIAGNOSIS — E11622 Type 2 diabetes mellitus with other skin ulcer: Secondary | ICD-10-CM | POA: Insufficient documentation

## 2021-04-08 DIAGNOSIS — I872 Venous insufficiency (chronic) (peripheral): Secondary | ICD-10-CM | POA: Insufficient documentation

## 2021-04-08 DIAGNOSIS — B999 Unspecified infectious disease: Secondary | ICD-10-CM | POA: Insufficient documentation

## 2021-04-08 NOTE — Progress Notes (Addendum)
LEVESTER, WALDRIDGE ([id number]) ?Visit Report for 04/08/2021 ?Allergy List Details ?Patient Name: Shane Bean ?Date of Service: 04/08/2021 8:45 AM ?Medical Record Number: [id number] ?Patient Account Number: [id number] ?Date of Birth/Sex: [birth year] (66 y.o. M) ?Treating RN: Carlene Coria ?Primary Care Early Steel: Reeves Dam Other Clinician: ?Referring Vetra Shinall: Nance Pear ?Treating Damesha Lawler/Extender: Jeri Cos ?Weeks in Treatment: 0 ?Allergies ?Active Allergies ?chlorthalidone ?hydralazine ?lisinopril ?Allergy Notes ?Electronic Signature(s) ?Signed: 04/08/2021 9:23:23 AM By: Carlene Coria RN ?Entered By: Carlene Coria on 04/08/2021 09:02:32 ?KENDYN, ZAMAN ([id number]) ?-------------------------------------------------------------------------------- ?Arrival Information Details ?Patient Name: Shane Bean ?Date of Service: 04/08/2021 8:45 AM ?Medical Record Number: [id number] ?Patient Account Number: [id number] ?Date of Birth/Sex: 02-23-1955 (66 y.o. M) ?Treating RN: Carlene Coria ?Primary Care Geeta Dworkin: Reeves Dam Other Clinician: ?Referring Fortune Brannigan: Nance Pear ?Treating Shubh Chiara/Extender: Jeri Cos ?Weeks in Treatment: 0 ?Visit Information ?Patient Arrived: Ambulatory ?Arrival Time: 08:59 ?Accompanied By: self ?Transfer Assistance: None ?Patient Identification Verified: Yes ?Secondary Verification Process Completed: Yes ?Patient Requires Transmission-Based No ?Precautions: ?Patient Has Alerts: Yes ?Patient Alerts: Patient on Blood ?Thinner ?ABI R [place] TBI .90 7/22 ?ABI L 1.46 TBI .76 ?7/22 ?History Since Last Visit ?All ordered tests and consults were completed: No ?Added or deleted any medications: No ?Any new allergies or adverse reactions: No ?Had a fall or experienced change in activities of daily living that may affect risk of falls: No ?Signs or symptoms of abuse/neglect since last visito No ?Hospitalized since last visit: No ?Implantable device outside of the clinic excluding cellular  tissue based products placed in the center since last visit: No ?Pain Present Now: Yes ?Electronic Signature(s) ?Signed: 04/08/2021 9:23:23 AM By: Carlene Coria RN ?Entered By: Carlene Coria on 04/08/2021 09:08:51 ?ZACH, TIETJE ([id number]) ?-------------------------------------------------------------------------------- ?Clinic Level of Care Assessment Details ?Patient Name: Shane Bean ?Date of Service: 04/08/2021 8:45 AM ?Medical Record Number: [id number] ?Patient Account Number: [id number] ?Date of Birth/Sex: Nov 09, 1955 (66 y.o. M) ?Treating RN: Carlene Coria ?Primary Care Temesha Queener: Reeves Dam Other Clinician: ?Referring Jurgen Groeneveld: Nance Pear ?Treating Audreyana Huntsberry/Extender: Jeri Cos ?Weeks in Treatment: 0 ?Clinic Level of Care Assessment Items ?TOOL 1 Quantity Score ?X - Use when EandM and Procedure is performed on INITIAL visit 1 0 ?ASSESSMENTS - Nursing Assessment / Reassessment ?X - General Physical Exam (combine w/ comprehensive assessment (listed just below) when performed on new ?1 20 ?pt. evals) ?X- 1 25 ?Comprehensive Assessment (HX, ROS, Risk Assessments, Wounds Hx, etc.) ?ASSESSMENTS - Wound and Skin Assessment / Reassessment ?'[]'$  - Dermatologic / Skin Assessment (not related to wound area) 0 ?ASSESSMENTS - Ostomy and/or Continence Assessment and Care ?'[]'$  - Incontinence Assessment and Management 0 ?'[]'$  - 0 ?Ostomy Care Assessment and Management (repouching, etc.) ?PROCESS - Coordination of Care ?X - Simple Patient / Family Education for ongoing care 1 15 ?'[]'$  - 0 ?Complex (extensive) Patient / Family Education for ongoing care ?X- 1 10 ?Staff obtains Consents, Records, Test Results / Process Orders ?'[]'$  - 0 ?Staff telephones HHA, Nursing Homes / Clarify orders / etc ?'[]'$  - 0 ?Routine Transfer to another Facility (non-emergent condition) ?'[]'$  - 0 ?Routine Hospital Admission (non-emergent condition) ?X- 1 15 ?New Admissions / Biomedical engineer / Ordering NPWT, Apligraf, etc. ?'[]'$  -  0 ?Emergency Hospital Admission (emergent condition) ?PROCESS - Special Needs ?'[]'$  - Pediatric / Minor Patient Management 0 ?'[]'$  - 0 ?Isolation Patient Management ?'[]'$  - 0 ?Hearing / Language / Visual special needs ?'[]'$  - 0 ?Assessment of Community assistance (transportation, D/C planning, etc.) ?'[]'$  - 0 ?Additional assistance / Altered mentation ?'[]'$  -  0 ?Support Surface(s) Assessment (bed, cushion, seat, etc.) ?INTERVENTIONS - Miscellaneous ?'[]'$  - External ear exam 0 ?'[]'$  - 0 ?Patient Transfer (multiple staff / Civil Service fast streamer / Similar devices) ?'[]'$  - 0 ?Simple Staple / Suture removal (25 or less) ?'[]'$  - 0 ?Complex Staple / Suture removal (26 or more) ?'[]'$  - 0 ?Hypo/Hyperglycemic Management (do not check if billed separately) ?'[]'$  - 0 ?Ankle / Brachial Index (ABI) - do not check if billed separately ?Has the patient been seen at the hospital within the last three years: Yes ?Total Score: 85 ?Level Of Care: New/Established - Level ?3 ?ZAEL, SHUMAN ([id number]) ?Electronic Signature(s) ?Signed: 04/08/2021 1:18:59 PM By: Carlene Coria RN ?Entered By: Carlene Coria on 04/08/2021 10:15:50 ?DARIUS, LUNDBERG ([id number]) ?-------------------------------------------------------------------------------- ?Encounter Discharge Information Details ?Patient Name: Shane Bean ?Date of Service: 04/08/2021 8:45 AM ?Medical Record Number: [id number] ?Patient Account Number: [id number] ?Date of Birth/Sex: [birth year] (66 y.o. M) ?Treating RN: Carlene Coria ?Primary Care Xadrian Craighead: Reeves Dam Other Clinician: ?Referring Delaina Fetsch: Nance Pear ?Treating Raygen Dahm/Extender: Jeri Cos ?Weeks in Treatment: 0 ?Encounter Discharge Information Items Post Procedure Vitals ?Discharge Condition: Stable ?Temperature (?F): 98.3 ?Ambulatory Status: Ambulatory ?Pulse (bpm): 90 ?Discharge Destination: Home ?Respiratory Rate (breaths/min): 18 ?Transportation: Private Auto ?Blood Pressure (mmHg): 155/81 ?Accompanied By: self ?Schedule Follow-up  Appointment: Yes ?Clinical Summary of Care: Patient Declined ?Electronic Signature(s) ?Signed: 04/08/2021 10:19:31 AM By: Carlene Coria RN ?Entered By: Carlene Coria on 04/08/2021 10:19:30 ?Shane Bean ([id number]) ?-------------------------------------------------------------------------------- ?Lower Extremity Assessment Details ?Patient Name: Shane Bean ?Date of Service: 04/08/2021 8:45 AM ?Medical Record Number: [id number] ?Patient Account Number: [id number] ?Date of Birth/Sex: [birth year] (66 y.o. M) ?Treating RN: Carlene Coria ?Primary Care Roshaun Pound: Reeves Dam Other Clinician: ?Referring Charleene Callegari: Nance Pear ?Treating Aliyana Dlugosz/Extender: Jeri Cos ?Weeks in Treatment: 0 ?Edema Assessment ?Assessed: [Left: No] [Right: No] ?Edema: [Left: Ye] [Right: s] ?Calf ?Left: Right: ?Point of Measurement: 35 cm From Medial Instep 42 cm ?Ankle ?Left: Right: ?Point of Measurement: 10 cm From Medial Instep 29 cm ?Knee To Floor ?Left: Right: ?From Medial Instep 45 cm ?Vascular Assessment ?Pulses: ?Dorsalis Pedis ?Palpable: [Right:Yes] ?Electronic Signature(s) ?Signed: 04/08/2021 9:23:23 AM By: Carlene Coria RN ?Entered By: Carlene Coria on 04/08/2021 09:18:31 ?DALONTE, HARDAGE ([id number]) ?-------------------------------------------------------------------------------- ?Multi Wound Chart Details ?Patient Name: Shane Bean ?Date of Service: 04/08/2021 8:45 AM ?Medical Record Number: [id number] ?Patient Account Number: [id number] ?Date of Birth/Sex: 1955-08-25 (66 y.o. 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

## 2021-04-08 NOTE — Progress Notes (Signed)
Shane Bean, Shane Bean ([id number]) ?Visit Report for 04/08/2021 ?Abuse Risk Screen Details ?Patient Name: Shane Bean ?Date of Service: 04/08/2021 8:45 AM ?Medical Record Number: [id number] ?Patient Account Number: [id number] ?Date of Birth/Sex: [birth year] (66 y.o. Male) ?Treating RN: Carlene Coria ?Primary Care Maraki Macquarrie: Reeves Dam Other Clinician: ?Referring Darlyn Repsher: Nance Pear ?Treating Joel Cowin/Extender: Jeri Cos ?Weeks in Treatment: 0 ?Abuse Risk Screen Items ?Answer ?ABUSE RISK SCREEN: ?Has anyone close to you tried to hurt or harm you recentlyo No ?Do you feel uncomfortable with anyone in your familyo No ?Has anyone forced you do things that you didnot want to doo No ?Electronic Signature(s) ?Signed: 04/08/2021 9:23:23 AM By: Carlene Coria RN ?Entered By: Carlene Coria on 04/08/2021 09:04:01 ?Shane Bean, Shane Bean ([id number]) ?-------------------------------------------------------------------------------- ?Activities of Daily Living Details ?Patient Name: Shane Bean ?Date of Service: 04/08/2021 8:45 AM ?Medical Record Number: [id number] ?Patient Account Number: [id number] ?Date of Birth/Sex: [birth year] (66 y.o. Male) ?Treating RN: Carlene Coria ?Primary Care Bahar Shelden: Reeves Dam Other Clinician: ?Referring Shakeitha Umbaugh: Nance Pear ?Treating Noura Purpura/Extender: Jeri Cos ?Weeks in Treatment: 0 ?Activities of Daily Living Items ?Answer ?Activities of Daily Living (Please select one for each item) ?[place] ?Take Medications Completely Able ?Use Telephone Completely Able ?Care for Appearance Completely Able ?Use Toilet Completely Able ?Bath / Shower Completely Able ?Dress Self Completely Able ?Feed Self Completely Able ?Walk Completely Able ?Get In / Out Bed Completely Able ?Housework Completely Able ?Prepare Meals Completely Able ?Handle Money Completely Able ?Shop for Self Completely Able ?Electronic Signature(s) ?Signed: 04/08/2021 9:23:23 AM By: Carlene Coria RN ?Entered  ByCarlene Coria on 04/08/2021 09:04:24 ?Shane Bean, Shane Bean ([id number]) ?-------------------------------------------------------------------------------- ?Education Screening Details ?Patient Name: Shane Bean ?Date of Service: 04/08/2021 8:45 AM ?Medical Record Number: [id number] ?Patient Account Number: [id number] ?Date of Birth/Sex: Mar 20, 1955 (66 y.o. Male) ?Treating RN: Carlene Coria ?Primary Care Demontre Padin: Reeves Dam Other Clinician: ?Referring Deaunte Dente: Nance Pear ?Treating Aiyden Lauderback/Extender: Jeri Cos ?Weeks in Treatment: 0 ?Primary Learner Assessed: Patient ?Learning Preferences/Education Level/Primary Language ?Learning Preference: Explanation ?Highest Education Level: High School ?Preferred Language: English ?Cognitive Barrier ?Language Barrier: No ?Translator Needed: No ?Memory Deficit: No ?Emotional Barrier: No ?Cultural/Religious Beliefs Affecting Medical Care: No ?Physical Barrier ?Impaired Vision: No ?Impaired Hearing: No ?Decreased Hand dexterity: No ?Knowledge/Comprehension ?Knowledge Level: Medium ?Comprehension Level: High ?Ability to understand written instructions: High ?Ability to understand verbal instructions: High ?Motivation ?Anxiety Level: Anxious ?Cooperation: Cooperative ?Education Importance: Acknowledges Need ?Interest in Health Problems: Asks Questions ?Perception: Coherent ?Willingness to Engage in Self-Management ?High ?Activities: ?Readiness to Engage in Self-Management ?High ?Activities: ?Electronic Signature(s) ?Signed: 04/08/2021 9:23:23 AM By: Carlene Coria RN ?Entered ByCarlene Coria on 04/08/2021 09:05:29 ?Shane Bean, Shane Bean ([id number]) ?-------------------------------------------------------------------------------- ?Fall Risk Assessment Details ?Patient Name: Shane Bean ?Date of Service: 04/08/2021 8:45 AM ?Medical Record Number: [id number] ?Patient Account Number: [id number] ?Date of Birth/Sex: [birth year] (66 y.o. Male) ?Treating RN: Carlene Coria ?Primary  Care Tequita Marrs: Reeves Dam Other Clinician: ?Referring Garron Eline: Nance Pear ?Treating Eman Morimoto/Extender: Jeri Cos ?Weeks in Treatment: 0 ?Fall Risk Assessment Items ?Have you had 2 or more falls in the last 12 monthso 0 No ?Have you had any fall that resulted in injury in the last 12 monthso 0 No ?FALLS RISK SCREEN ?History of falling - immediate or within 3 months 0 No ?Secondary diagnosis (Do you have 2 or more medical diagnoseso) 0 No ?Ambulatory aid ?None/bed rest/wheelchair/nurse 0 No ?Crutches/cane/walker 0 No ?Furniture 0 No ?Intravenous therapy Access/Saline/Heparin Lock 0 No ?Gait/Transferring ?Normal/ bed rest/ wheelchair 0 No ?Weak (short steps with or  without shuffle, stooped but able to lift head while walking, may ?0 No ?seek support from furniture) ?Impaired (short steps with shuffle, may have difficulty arising from chair, head down, impaired ?0 No ?balance) ?Mental Status ?Oriented to own ability 0 No ?Electronic Signature(s) ?Signed: 04/08/2021 9:23:23 AM By: Carlene Coria RN ?Entered By: Carlene Coria on 04/08/2021 09:05:37 ?Shane Bean, Shane Bean ([id number]) ?-------------------------------------------------------------------------------- ?Foot Assessment Details ?Patient Name: Shane Bean ?Date of Service: 04/08/2021 8:45 AM ?Medical Record Number: [id number] ?Patient Account Number: [id number] ?Date of Birth/Sex: [birth year] (66 y.o. Male) ?Treating RN: Carlene Coria ?Primary Care Jerri Glauser: Reeves Dam Other Clinician: ?Referring Shereta Crothers: Nance Pear ?Treating Jayse Hodkinson/Extender: Jeri Cos ?Weeks in Treatment: 0 ?Foot Assessment Items ?Site Locations ?+ = Sensation present, - = Sensation absent, C = Callus, U = Ulcer ?R = Redness, W = Warmth, M = Maceration, PU = Pre-ulcerative lesion ?F = Fissure, S = Swelling, D = Dryness ?Assessment ?Right: Left: ?Other Deformity: No No ?Prior Foot Ulcer: No No ?Prior Amputation: No No ?Charcot Joint: No No ?Ambulatory Status: Ambulatory  Without Help ?Gait: Steady ?Electronic Signature(s) ?Signed: 04/08/2021 9:23:23 AM By: Carlene Coria RN ?Entered By: Carlene Coria on 04/08/2021 09:07:54 ?Shane Bean, Shane Bean ([id number]) ?-------------------------------------------------------------------------------- ?Nutrition Risk Screening Details ?Patient Name: Shane Bean ?Date of Service: 04/08/2021 8:45 AM ?Medical Record Number: [id number] ?Patient Account Number: [id number] ?Date of Birth/Sex: March 25, 1955 (66 y.o. Male) ?Treating RN: Carlene Coria ?Primary Care Zakkary Thibault: Reeves Dam Other Clinician: ?Referring Skylah Delauter: Nance Pear ?Treating Daichi Moris/Extender: Jeri Cos ?Weeks in Treatment: 0 ?Height (in): 70 ?Weight (lbs): 277 ?Body Mass Index (BMI): 39.7 ?Nutrition Risk Screening Items ?Score Screening ?NUTRITION RISK SCREEN: ?I have an illness or condition that made me change the kind and/or amount of food I eat 0 No ?I eat fewer than two meals per day 0 No ?I eat few fruits and vegetables, or milk products 0 No ?I have three or more drinks of beer, liquor or wine almost every day 0 No ?I have tooth or mouth problems that make it hard for me to eat 0 No ?I don't always have enough money to buy the food I need 0 No ?I eat alone most of the time 0 No ?I take three or more different prescribed or over-the-counter drugs a day 1 Yes ?Without wanting to, I have lost or gained 10 pounds in the last six months 0 No ?I am not always physically able to shop, cook and/or feed myself 0 No ?Nutrition Protocols ?Good Risk Protocol ?Moderate Risk Protocol ?High Risk Proctocol ?Risk Level: Good Risk ?Score: 1 ?Electronic Signature(s) ?Signed: 04/08/2021 9:23:23 AM By: Carlene Coria RN ?Entered By: Carlene Coria on 04/08/2021 09:05:49 ?

## 2021-04-09 NOTE — Progress Notes (Addendum)
HARLESS, MOLINARI ([id number]) ?Visit Report for 04/08/2021 ?Chief Complaint Document Details ?Patient Name: Shane Bean ?Date of Service: 04/08/2021 8:45 AM ?Medical Record Number: [id number] ?Patient Account Number: [id number] ?Date of Birth/Sex: [birth year] (66 y.o. M) ?Treating RN: Carlene Coria ?Primary Care Provider: Reeves Dam Other Clinician: ?Referring Provider: Reeves Dam ?Treating Provider/Extender: Jeri Cos ?Weeks in Treatment: 0 ?Information Obtained from: Patient ?Chief Complaint ?Left LE Ulcer ?Electronic Signature(s) ?Signed: 04/08/2021 9:44:53 AM By: Worthy Keeler PA-C ?Entered By: Worthy Keeler on 04/08/2021 09:44:52 ?NOLE, ROBEY ([id number]) ?-------------------------------------------------------------------------------- ?Debridement Details ?Patient Name: Shane Bean ?Date of Service: 04/08/2021 8:45 AM ?Medical Record Number: [id number] ?Patient Account Number: [id number] ?Date of Birth/Sex: 1955-06-13 (66 y.o. M) ?Treating RN: Carlene Coria ?Primary Care Provider: Reeves Dam Other Clinician: ?Referring Provider: Reeves Dam ?Treating Provider/Extender: Jeri Cos ?Weeks in Treatment: 0 ?Debridement Performed for ?Wound #2 Right,Anterior Lower Leg ?Assessment: ?Performed By: Physician Tommie Sams., PA-C ?Debridement Type: Chemical/Enzymatic/Mechanical ?Agent Used: Saline and gauze ?Severity of Tissue Pre Debridement: Fat layer exposed ?Level of Consciousness (Pre- ?Awake and Alert ?procedure): ?Pre-procedure Verification/Time Out ?Yes - 09:45 ?Taken: ?Start Time: 09:45 ?Instrument: ?Other : saline and gauze ?Bleeding: Minimum ?Hemostasis Achieved: Pressure ?End Time: 09:50 ?Procedural Pain: 0 ?Post Procedural Pain: 0 ?Response to Treatment: Procedure was tolerated well ?Level of Consciousness (Post- ?Awake and Alert ?procedure): ?Post Debridement Measurements of Total Wound ?Length: (cm) 1 ?Width: (cm) 1 ?Depth: (cm) 0.01 ?Volume: (cm?) 0.008 ?Character of  Wound/Ulcer Post Debridement: Improved ?Severity of Tissue Post Debridement: Fat layer exposed ?Post Procedure Diagnosis ?Same as Pre-procedure ?Electronic Signature(s) ?Signed: 04/08/2021 1:18:59 PM By: Carlene Coria RN ?Signed: 04/09/2021 5:15:24 PM By: Worthy Keeler PA-C ?Entered By: Carlene Coria on 04/08/2021 10:02:02 ?NITIN, MCKOWEN ([id number]) ?-------------------------------------------------------------------------------- ?Debridement Details ?Patient Name: Shane Bean ?Date of Service: 04/08/2021 8:45 AM ?Medical Record Number: [id number] ?Patient Account Number: [id number] ?Date of Birth/Sex: March 08, 1955 (66 y.o. M) ?Treating RN: Carlene Coria ?Primary Care Provider: Reeves Dam Other Clinician: ?Referring Provider: Reeves Dam ?Treating Provider/Extender: Jeri Cos ?Weeks in Treatment: 0 ?Debridement Performed for ?Wound #3 Right,Lateral Lower Leg ?Assessment: ?Performed By: Physician Tommie Sams., PA-C ?Debridement Type: Debridement ?Severity of Tissue Pre Debridement: Fat layer exposed ?Level of Consciousness (Pre- ?Awake and Alert ?procedure): ?Pre-procedure Verification/Time Out ?Yes - 09:45 ?Taken: ?Start Time: 09:45 ?Total Area Debrided (L x W): 3 (cm) x 3 (cm) = 9 (cm?) ?Tissue and other material ?Viable, Non-Viable, Slough, Subcutaneous, Skin: Dermis , Skin: Epidermis, Slough ?debrided: ?Level: Skin/Subcutaneous Tissue ?Debridement Description: Excisional ?Instrument: Curette ?Bleeding: Minimum ?Hemostasis Achieved: Pressure ?End Time: 09:55 ?Procedural Pain: 0 ?Post Procedural Pain: 0 ?Response to Treatment: Procedure was tolerated well ?Level of Consciousness (Post- ?Awake and Alert ?procedure): ?Post Debridement Measurements of Total Wound ?Length: (cm) 13 ?Width: (cm) 7 ?Depth: (cm) 0.1 ?Volume: (cm?) 7.147 ?Character of Wound/Ulcer Post Debridement: Improved ?Severity of Tissue Post Debridement: Fat layer exposed ?Post Procedure Diagnosis ?Same as Pre-procedure ?Electronic  Signature(s) ?Signed: 04/08/2021 10:17:21 AM By: Carlene Coria RN ?Signed: 04/09/2021 5:15:24 PM By: Worthy Keeler PA-C ?Entered By: Carlene Coria on 04/08/2021 10:17:21 ?CORDELLE, DAHMEN ([id number]) ?-------------------------------------------------------------------------------- ?HPI Details ?Patient Name: Shane Bean ?Date of Service: 04/08/2021 8:45 AM ?Medical Record Number: [id number] ?Patient Account Number: [id number] ?Date of Birth/Sex: [birth year] (66 y.o. M) ?Treating RN: Carlene Coria ?Primary Care Provider: Reeves Dam Other Clinician: ?Referring Provider: Reeves Dam ?Treating Provider/Extender: Jeri Cos ?Weeks in Treatment: 0 ?History of Present Illness ?HPI Description: 05/18/2020 upon evaluation today patient appears for initial evaluation  here in clinic concerning issues that he has been having ?with the wound on his left lateral leg. Fortunately there does not appear to be any signs of obvious an active infection at this time which is great ?news. With that being said the patient unfortunately is continued to have issues with pain although he tells me it is gotten a lot better. He was on ?Keflex as well as Bactrim DS which seems to have done a good job there. His most recent hemoglobin A1c was 9.8 that was on 04/14/2020. ?Subsequently I do feel like that the patient is making progress here. He does have a history of chronic venous insufficiency as well as ?hypertension. I do not see any need for antibiotics at this point. ?5/25; follow-up of the wound on the left posterior calf. This is completely necrotic on the surface. It does not look infected but it is painful. He is a ?diabetic but I think there is some suggestion here of chronic venous disease as well. We used Iodoflex under compression last week ?6/1; again a completely necrotic surface on this with the wound. We have been using Iodoflex under compression he complains of gnawing pain. ?He has had wounds previously a lot of this looks  like chronic venous disease. ?6/8; about two thirds of the surface of this wound is still covered in a black necrotic eschar. I changed him from Iodoflex to [place] last week ?because of complaints of pain. He actually was seen by her neurologist this weeko Peripheral neuropathy as a cause of the pain and they changed ?him from Neurontin to Lyrica but he still has not had any relief. He says that most of the pain however is in his heel not in his wound per se. He is ?a diabetic ?6/15. This is a patient with what looks to be a venous wound on the left lateral lower leg. He is also a diabetic. Currently being worked up for ?diabetic neuropathy. He complains of pain out of proportion to the size of the wound although to be fair the entire area here was eschared. We ?have been working to get a viable surface. We are using Sorbact ?6/22; fairly painful wound on the left posterior calf. I am assuming this is been venous he is also a diabetic. His ABI in our clinic was ?noncompressible however he has easily palpable pulses on his feet. He complains of a lot of pain in the wound also of the left heel and the upper ?left calf. Some of this may be neuropathy. It is led me to discontinue Iodoflex reduce his compression but he still complains of pain in fact he says ?he could not take a debridement today. When I first saw this it was completely necrotic surface we have got it down to something that looks a ?reasonably healthy ?I have been wondering about biopsying this area however he is on Coumadin and with the pain I have put this off. The major question would be ?an inflammatory ulcer such as pyoderma. The patient is not aware of how this started. He does however have a wound history on both legs. ?6/29; patient presents for 1 week follow-up. He has been using Hydrofera Blue under Kerlix/Coban. He has tolerated this well. He currently denies ?signs of infection. He also declines debridement today. He he states it is painful  and does not want to have this done. ?7/6; Patient presents for 1 week follow-up. He has been using Hydrofera Blue under Kerlix/Coban. He denies signs of  infection. He declines ?debridement today. ?7/15; patient presen

## 2021-04-10 LAB — AEROBIC CULTURE W GRAM STAIN (SUPERFICIAL SPECIMEN)

## 2021-04-15 ENCOUNTER — Encounter: Payer: Medicare (Managed Care) | Admitting: Physician Assistant

## 2021-04-15 DIAGNOSIS — E11622 Type 2 diabetes mellitus with other skin ulcer: Secondary | ICD-10-CM | POA: Diagnosis not present

## 2021-04-15 NOTE — Progress Notes (Addendum)
Shane Bean ([id number]) ?Visit Report for 04/15/2021 ?Chief Complaint Document Details ?Patient Name: Shane Bean ?Date of Service: 04/15/2021 8:15 AM ?Medical Record Number: [id number] ?Patient Account Number: [id number] ?Date of Birth/Sex: Jun 17, 1955 (66 y.o. M) ?Treating RN: Cornell Barman ?Primary Care Provider: Reeves Dam Other Clinician: ?Referring Provider: Reeves Dam ?Treating Provider/Extender: Jeri Cos ?Weeks in Treatment: 1 ?Information Obtained from: Patient ?Chief Complaint ?Left LE Ulcer ?Electronic Signature(s) ?Signed: 04/15/2021 8:42:47 AM By: Worthy Keeler PA-C ?Entered By: Worthy Keeler on 04/15/2021 08:42:47 ?Shane Bean ([id number]) ?-------------------------------------------------------------------------------- ?HPI Details ?Patient Name: Shane Bean ?Date of Service: 04/15/2021 8:15 AM ?Medical Record Number: [id number] ?Patient Account Number: [id number] ?Date of Birth/Sex: 1955-02-21 (66 y.o. M) ?Treating RN: Cornell Barman ?Primary Care Provider: Reeves Dam Other Clinician: ?Referring Provider: Reeves Dam ?Treating Provider/Extender: Jeri Cos ?Weeks in Treatment: 1 ?History of Present Illness ?HPI Description: 05/18/2020 upon evaluation today patient appears for initial evaluation here in clinic concerning issues that he has been having ?with the wound on his left lateral leg. Fortunately there does not appear to be any signs of obvious an active infection at this time which is great ?news. With that being said the patient unfortunately is continued to have issues with pain although he tells me it is gotten a lot better. He was on ?Keflex as well as Bactrim DS which seems to have done a good job there. His most recent hemoglobin A1c was 9.8 that was on 04/14/2020. ?Subsequently I do feel like that the patient is making progress here. He does have a history of chronic venous insufficiency as well as ?hypertension. I do not see any need for antibiotics at  this point. ?5/25; follow-up of the wound on the left posterior calf. This is completely necrotic on the surface. It does not look infected but it is painful. He is a ?diabetic but I think there is some suggestion here of chronic venous disease as well. We used Iodoflex under compression last week ?6/1; again a completely necrotic surface on this with the wound. We have been using Iodoflex under compression he complains of gnawing pain. ?He has had wounds previously a lot of this looks like chronic venous disease. ?6/8; about two thirds of the surface of this wound is still covered in a black necrotic eschar. I changed him from Iodoflex to [place] last week ?because of complaints of pain. He actually was seen by her neurologist this weeko Peripheral neuropathy as a cause of the pain and they changed ?him from Neurontin to Lyrica but he still has not had any relief. He says that most of the pain however is in his heel not in his wound per se. He is ?a diabetic ?6/15. This is a patient with what looks to be a venous wound on the left lateral lower leg. He is also a diabetic. Currently being worked up for ?diabetic neuropathy. He complains of pain out of proportion to the size of the wound although to be fair the entire area here was eschared. We ?have been working to get a viable surface. We are using Sorbact ?6/22; fairly painful wound on the left posterior calf. I am assuming this is been venous he is also a diabetic. His ABI in our clinic was ?noncompressible however he has easily palpable pulses on his feet. He complains of a lot of pain in the wound also of the left heel and the upper ?left calf. Some of this may be neuropathy. It is led me to discontinue  Iodoflex reduce his compression but he still complains of pain in fact he says ?he could not take a debridement today. When I first saw this it was completely necrotic surface we have got it down to something that looks a ?reasonably healthy ?I have been  wondering about biopsying this area however he is on Coumadin and with the pain I have put this off. The major question would be ?an inflammatory ulcer such as pyoderma. The patient is not aware of how this started. He does however have a wound history on both legs. ?6/29; patient presents for 1 week follow-up. He has been using Hydrofera Blue under Kerlix/Coban. He has tolerated this well. He currently denies ?signs of infection. He also declines debridement today. He he states it is painful and does not want to have this done. ?7/6; Patient presents for 1 week follow-up. He has been using Hydrofera Blue under Kerlix/Coban. He denies signs of infection. He declines ?debridement today. ?7/15; patient presents for 1 week follow-up. He has been using Hydrofera Blue under Kerlix/Coban. He denies signs of infection today. He is ?agreeable to having debridement done today. ?7/20; patient presents for 1 week follow-up. He is in good spirits today. He has been using Hydrofera Blue under Kerlix/Coban. He denies signs of ?infection today. ?7/27; patient presents for 1 week follow-up. He is in good spirits today. He has no issues or complaints today. He denies signs of infection. ?08/12/2020 upon evaluation today patient appears to be doing better in regard to his wound. He has been tolerating the dressing changes without ?complication. With that being said he did have arterial studies and does show that he has a TBI on the right of 0.90 and a TBI on the left of 0.76 ?which is excellent. There is no evidence of arterial compromise at this point. ?8/17; patient presents for 1 week follow-up. He has no issues or complaints today. He denies signs of infection. ?8/24; patient presents for 1 week follow-up. He has no issues or complaints today. He denies signs of infection. He continues to tolerate the ?compression wrap well. ?8/31; patient presents for 1 week follow-up. He has no issues or complaints today. He denies signs of  infection. He is in good spirits today. ?9/7; patient presents for 1 week follow-up. He is tolerated the compression wrap well. He has no issues or complaints today. He denies signs of ?infection. ?9/14; patient presents for 1 week follow-up. He has no issues or complaints today. He denies signs of infection. He is in excellent spirits today. ?9/21; patient presents for 1 week follow-up. He has no issues or complaints today. ?9/28; patient presents for 1 week follow-up. He has tolerated the compression wrap well. He has no issues or complaints today. ?10/5; patient presents for 1 week follow-up. He has no issues or complaints today. ?10/12; patient presents for 1 week follow-up. He has no issues or complaints today. He denies signs of infection. JES, COSTALES ([id number]) ?Readmission: ?04-08-2021 upon evaluation today patient appears to be doing poorly in regard to his right lower extremity. He is being seen for reevaluation here in ?the clinic he was last discharged healed in October 2022. Subsequently at that time it was noted in the chart that he was told to be wearing ?compression socks unfortunately the patient tells me he never remembers being told that. I did discuss this with him today as well as Hosie Poisson ?discussing it with him today again. Obviously he does know at this point once he  is healed he should be wearing compression socks all the time ?from the time he gets up in the morning to when he goes to sleep at night day in and day out. I think this is the only way that you can to keep ?things under control. Otherwise his medical history has not changed his arterial study in the past was excellent and did not appear to show any ?signs of issue at this point which is great news as well. ?04-15-2021 upon evaluation today patient's wound is actually looking much drier than what it was last week. Again I do believe the infection is ?showing signs of improvement although he is having pain for sore I think  this has to do more with the dryness of the wound at this time as ?opposed to the infection that we were noted in the last week. Fortunately I do not see any evidence of active infection locally or systemically at ?th

## 2021-04-15 NOTE — Progress Notes (Signed)
Shane, Bean ([id number]) ?Visit Report for 04/15/2021 ?Arrival Information Details ?Patient Name: Shane Bean ?Date of Service: 04/15/2021 8:15 AM ?Medical Record Number: [id number] ?Patient Account Number: [id number] ?Date of Birth/Sex: 1955-06-10 (66 y.o. M) ?Treating RN: Shane Bean ?Primary Care Shane Bean: Reeves Dam Other Clinician: ?Referring Shane Bean: Reeves Dam ?Treating Jazminn Pomales/Extender: Shane Bean ?Weeks in Treatment: 1 ?Visit Information History Since Last Visit ?Added or deleted any medications: No ?Patient Arrived: Ambulatory ?Has Dressing in Place as Prescribed: No ?Arrival Time: 08:30 ?Pain Present Now: Yes ?Transfer Assistance: None ?Patient Identification Verified: Yes ?Secondary Verification Process Completed: Yes ?Patient Requires Transmission-Based No ?Precautions: ?Patient Has Alerts: Yes ?Patient Alerts: Patient on Blood ?Thinner ?ABI R Shane Bean TBI .90 7/22 ?ABI L 1.46 TBI .76 ?7/22 ?Electronic Signature(s) ?Signed: 04/15/2021 2:47:56 PM By: Shane Bean, BSN, RN, CWS, Kim RN, BSN ?Entered By: Shane Bean, BSN, RN, CWS, Kim on 04/15/2021 08:30:50 ?Shane, Bean ([id number]) ?-------------------------------------------------------------------------------- ?Compression Therapy Details ?Patient Name: Shane Bean ?Date of Service: 04/15/2021 8:15 AM ?Medical Record Number: [id number] ?Patient Account Number: [id number] ?Date of Birth/Sex: [birth year] (66 y.o. M) ?Treating RN: Shane Bean ?Primary Care Shane Bean: Reeves Dam Other Clinician: ?Referring Shane Bean: Reeves Dam ?Treating Shane Bean/Extender: Shane Bean ?Weeks in Treatment: 1 ?Compression Therapy Performed for Wound Assessment: Wound #2 Right,Anterior Lower Leg ?Performed By: Clinician Shane Barman, RN ?Compression Type: Three Layer ?Pre Treatment ABI: 1.5 ?Post Procedure Diagnosis ?Same as Pre-procedure ?Electronic Signature(s) ?Signed: 04/15/2021 2:47:56 PM By: Shane Bean, BSN, RN, CWS, Kim RN, BSN ?Entered By: Shane Bean, BSN, RN, CWS, Kim  on 04/15/2021 08:49:19 ?Shane, Bean ([id number]) ?-------------------------------------------------------------------------------- ?Compression Therapy Details ?Patient Name: Shane Bean ?Date of Service: 04/15/2021 8:15 AM ?Medical Record Number: [id number] ?Patient Account Number: [id number] ?Date of Birth/Sex: 1955-08-24 (66 y.o. M) ?Treating RN: Shane Bean ?Primary Care Shane Bean: Reeves Dam Other Clinician: ?Referring Shane Bean: Reeves Dam ?Treating Shane Bean/Extender: Shane Bean ?Weeks in Treatment: 1 ?Compression Therapy Performed for Wound Assessment: Wound #3 Right,Lateral Lower Leg ?Performed By: Clinician Shane Barman, RN ?Compression Type: Three Layer ?Pre Treatment ABI: 1.5 ?Post Procedure Diagnosis ?Same as Pre-procedure ?Electronic Signature(s) ?Signed: 04/15/2021 2:47:56 PM By: Shane Bean, BSN, RN, CWS, Kim RN, BSN ?Entered By: Shane Bean, BSN, RN, CWS, Kim on 04/15/2021 08:49:19 ?Shane Bean ([id number]) ?-------------------------------------------------------------------------------- ?Encounter Discharge Information Details ?Patient Name: Shane Bean ?Date of Service: 04/15/2021 8:15 AM ?Medical Record Number: [id number] ?Patient Account Number: [id number] ?Date of Birth/Sex: April 11, 1955 (66 y.o. M) ?Treating RN: Shane Bean ?Primary Care Shane Bean: Reeves Dam Other Clinician: ?Referring Shane Bean: Reeves Dam ?Treating Shane Bean/Extender: Shane Bean ?Weeks in Treatment: 1 ?Encounter Discharge Information Items ?Discharge Condition: Stable ?Ambulatory Status: Ambulatory ?Discharge Destination: Home ?Transportation: Private Auto ?Schedule Follow-up Appointment: Yes ?Clinical Summary of Care: ?Electronic Signature(s) ?Signed: 04/15/2021 2:47:56 PM By: Shane Bean, BSN, RN, CWS, Kim RN, BSN ?Entered By: Shane Bean, BSN, RN, CWS, Kim on 04/15/2021 09:07:11 ?Shane, Bean ([id number]) ?-------------------------------------------------------------------------------- ?Lower Extremity Assessment  Details ?Patient Name: Shane Bean ?Date of Service: 04/15/2021 8:15 AM ?Medical Record Number: [id number] ?Patient Account Number: [id number] ?Date of Birth/Sex: 1955/02/06 (66 y.o. M) ?Treating RN: Shane Bean ?Primary Care Shane Bean: Reeves Dam Other Clinician: ?Referring Shane Bean: Reeves Dam ?Treating Shane Bean/Extender: Shane Bean ?Weeks in Treatment: 1 ?Edema Assessment ?Assessed: [Left: No] [Right: No] ?[Left: Edema] [Right: :] ?Calf ?Left: Right: ?Point of Measurement: 35 cm From Medial Instep 43 cm ?Ankle ?Left: Right: ?Point of Measurement: 10 cm From Medial Instep 28 cm ?Knee To Floor ?Left: Right: ?From Medial Instep 43 cm ?Vascular Assessment ?Pulses: ?Dorsalis Pedis ?Palpable: [Right:Yes] ?Electronic Signature(s) ?Signed: 04/15/2021  2:47:56 PM By: Shane Bean, BSN, RN, CWS, Kim RN, BSN ?Entered By: Shane Bean, BSN, RN, CWS, Kim on 04/15/2021 08:39:26 ?Shane, Bean ([id number]) ?-------------------------------------------------------------------------------- ?Multi Wound Chart Details ?Patient Name: Shane Bean ?Date of Service: 04/15/2021 8:15 AM ?Medical Record Number: [id number] ?Patient Account Number: [id number] ?Date of Birth/Sex: [birth year] (66 y.o. 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/Ulcer of the Lower N/A ?Extremity Extremity ?Comorbid History: Hypertension, Type II Diabetes, Hypertension, Type II Diabetes, N/A ?Neuropathy Neuropathy ?Date Acquired:  03/03/2021 02/03/2021 N/A ?Weeks of Treatment: 1 1 N/A ?Wound Status: Open Open N/A ?Wound Recurrence: No No N/A ?Measurements L x W x D (cm) 1x1x0.1 13x11x0.1 N/A ?Area (cm?) : 0.785 112.312 N/A ?Volume (cm?) : 0.079 11.231 N/A ?% Reduction in Area: 0.00% -57.10% N/A ?% Reduction in Volume: 0.00% -57.10% N/A ?Classification: Grade 2 Grade 2 N/A ?Exudate Amount: Medium Medium N/A ?Exudate Type: Serosanguineous Serosanguineous N/A ?Exudate Color: red, brown red, brown N/A ?Granulation Amount: None Present (0%) N/A N/A ?Necrotic Amount: Large (67-100%) N/A N/A ?Necrotic Tissue: Eschar Eschar, Adherent Slough N/A ?Exposed Structures: ?Fat Layer (Subcutaneous Tissue): ?Fat Layer (Subcutaneous Tissue): N/A ?Yes Yes ?Fascia: No ?Fascia: No ?Tendon: No ?Tendon: No ?Muscle: No ?Muscle: No ?Joint: No ?Joint: No ?Bone: No ?Bone: No ?Epithelialization: None N/A N/A ?Treatment Notes ?Electronic Signature(s) ?Signed: 04/15/2021 2:47:56 PM By: Shane Bean, BSN, RN, CWS, Kim RN, BSN ?Entered By: Shane Bean, BSN, RN, CWS, Kim on 04/15/2021 08:47:31 ?Shane, Bean ([id number]) ?-------------------------------------------------------------------------------- ?Multi-Disciplinary Care Plan Details ?Patient Name: Shane Bean ?Date of Service: 04/15/2021 8:15 AM ?Medical Record Number: [id number] ?Patient Account Number: [id number] ?Date of Birth/Sex: 1955/02/16 (66 y.o. M) ?Treating RN: Shane Bean ?Primary Care Yanice Maqueda: Reeves Dam Other Clinician: ?Referring Britain Anagnos: Reeves Dam ?Treating Sweetie Giebler/Extender: Shane Bean ?Weeks in Treatment: 1 ?Active Inactive ?Necrotic Tissue ?Nursing Diagnoses: ?Impaired tissue integrity related to necrotic/devitalized tissue ?Knowledge deficit related to management of necrotic/devitalized tissue ?Goals: ?Necrotic/devitalized tissue will be minimized in the wound bed ?Date Initiated: 04/15/2021 ?Target Resolution Date: 04/15/2021 ?Goal Status: Active ?Patient/caregiver will verbalize understanding of  reason and process for debridement of necrotic tissue ?Date Initiated: 04/15/2021 ?Target Resolution Date: 04/15/2021 ?Goal Status: Active ?Interventions: ?Assess patient pain level pre-, during and post procedure

## 2021-04-22 ENCOUNTER — Encounter: Payer: Medicare (Managed Care) | Admitting: Physician Assistant

## 2021-04-22 ENCOUNTER — Other Ambulatory Visit: Payer: Self-pay

## 2021-04-22 ENCOUNTER — Emergency Department
Admission: EM | Admit: 2021-04-22 | Discharge: 2021-04-22 | Disposition: A | Discharge status: Home / Self Care | Payer: Medicare (Managed Care) | Attending: Emergency Medicine | Admitting: Emergency Medicine

## 2021-04-22 DIAGNOSIS — I10 Essential (primary) hypertension: Secondary | ICD-10-CM | POA: Insufficient documentation

## 2021-04-22 DIAGNOSIS — E119 Type 2 diabetes mellitus without complications: Secondary | ICD-10-CM | POA: Insufficient documentation

## 2021-04-22 DIAGNOSIS — E11622 Type 2 diabetes mellitus with other skin ulcer: Secondary | ICD-10-CM | POA: Diagnosis not present

## 2021-04-22 DIAGNOSIS — Z5189 Encounter for other specified aftercare: Secondary | ICD-10-CM

## 2021-04-22 DIAGNOSIS — Z4800 Encounter for change or removal of nonsurgical wound dressing: Secondary | ICD-10-CM | POA: Diagnosis present

## 2021-04-22 DIAGNOSIS — R600 Localized edema: Secondary | ICD-10-CM | POA: Insufficient documentation

## 2021-04-22 LAB — CBC
HCT: 35.6 % — ABNORMAL LOW (ref 39.0–52.0)
Hemoglobin: 10.6 g/dL — ABNORMAL LOW (ref 13.0–17.0)
MCH: 23.2 pg — ABNORMAL LOW (ref 26.0–34.0)
MCHC: 29.8 g/dL — ABNORMAL LOW (ref 30.0–36.0)
MCV: 77.9 fL — ABNORMAL LOW (ref 80.0–100.0)
Platelets: 251 10*3/uL (ref 150–400)
RBC: 4.57 MIL/uL (ref 4.22–5.81)
RDW: 16.6 % — ABNORMAL HIGH (ref 11.5–15.5)
WBC: 6.6 10*3/uL (ref 4.0–10.5)
nRBC: 0 % (ref 0.0–0.2)

## 2021-04-22 LAB — COMPREHENSIVE METABOLIC PANEL
ALT: 12 U/L (ref 0–44)
AST: 22 U/L (ref 15–41)
Albumin: 3.3 g/dL — ABNORMAL LOW (ref 3.5–5.0)
Alkaline Phosphatase: 56 U/L (ref 38–126)
Anion gap: 7 (ref 5–15)
BUN: 10 mg/dL (ref 8–23)
CO2: 27 mmol/L (ref 22–32)
Calcium: 9.2 mg/dL (ref 8.9–10.3)
Chloride: 101 mmol/L (ref 98–111)
Creatinine, Ser: 0.99 mg/dL (ref 0.61–1.24)
GFR, Estimated: >60 mL/min (ref >60)
Glucose, Bld: 315 mg/dL — ABNORMAL HIGH (ref 70–99)
Potassium: 4.2 mmol/L (ref 3.5–5.1)
Sodium: 135 mmol/L (ref 135–145)
Total Bilirubin: 0.5 mg/dL (ref 0.3–1.2)
Total Protein: 7.4 g/dL (ref 6.5–8.1)

## 2021-04-22 LAB — LACTIC ACID, PLASMA: Lactic Acid, Venous: 2 mmol/L (ref 0.5–1.9)

## 2021-04-22 MED ORDER — FUROSEMIDE 20 MG PO TABS: 20.0000 mg | ORAL_TABLET | Freq: Two times a day (BID) | ORAL | 0 refills | Status: DC

## 2021-04-22 NOTE — Progress Notes (Addendum)
KENDLE, ERKER ([id number]) ?Visit Report for 04/22/2021 ?Chief Complaint Document Details ?Patient Name: Shane Bean ?Date of Service: 04/22/2021 2:45 PM ?Medical Record Number: [id number] ?Patient Account Number: [id number] ?Date of Birth/Sex: 1955/10/14 (66 y.o. M) ?Treating RN: Carlene Coria ?Primary Care Provider: Reeves Dam Other Clinician: ?Referring Provider: Reeves Dam ?Treating Provider/Extender: Jeri Cos ?Weeks in Treatment: 2 ?Information Obtained from: Patient ?Chief Complaint ?Left LE Ulcer ?Electronic Signature(s) ?Signed: 04/22/2021 2:58:31 PM By: Worthy Keeler PA-C ?Entered By: Worthy Keeler on 04/22/2021 14:58:31 ?KANAAN, KAGAWA ([id number]) ?-------------------------------------------------------------------------------- ?HPI Details ?Patient Name: Shane Bean ?Date of Service: 04/22/2021 2:45 PM ?Medical Record Number: [id number] ?Patient Account Number: [id number] ?Date of Birth/Sex: [birth year] (66 y.o. M) ?Treating RN: Carlene Coria ?Primary Care Provider: Reeves Dam Other Clinician: ?Referring Provider: Reeves Dam ?Treating Provider/Extender: Jeri Cos ?Weeks in Treatment: 2 ?History of Present Illness ?HPI Description: 05/18/2020 upon evaluation today patient appears for initial evaluation here in clinic concerning issues that he has been having ?with the wound on his left lateral leg. Fortunately there does not appear to be any signs of obvious an active infection at this time which is great ?news. With that being said the patient unfortunately is continued to have issues with pain although he tells me it is gotten a lot better. He was on ?Keflex as well as Bactrim DS which seems to have done a good job there. His most recent hemoglobin A1c was 9.8 that was on 04/14/2020. ?Subsequently I do feel like that the patient is making progress here. He does have a history of chronic venous insufficiency as well as ?hypertension. I do not see any need for antibiotics at  this point. ?5/25; follow-up of the wound on the left posterior calf. This is completely necrotic on the surface. It does not look infected but it is painful. He is a ?diabetic but I think there is some suggestion here of chronic venous disease as well. We used Iodoflex under compression last week ?6/1; again a completely necrotic surface on this with the wound. We have been using Iodoflex under compression he complains of gnawing pain. ?He has had wounds previously a lot of this looks like chronic venous disease. ?6/8; about two thirds of the surface of this wound is still covered in a black necrotic eschar. I changed him from Iodoflex to Kamrar last week ?because of complaints of pain. He actually was seen by her neurologist this weeko Peripheral neuropathy as a cause of the pain and they changed ?him from Neurontin to Lyrica but he still has not had any relief. He says that most of the pain however is in his heel not in his wound per se. He is ?a diabetic ?6/15. This is a patient with what looks to be a venous wound on the left lateral lower leg. He is also a diabetic. Currently being worked up for ?diabetic neuropathy. He complains of pain out of proportion to the size of the wound although to be fair the entire area here was eschared. We ?have been working to get a viable surface. We are using Sorbact ?6/22; fairly painful wound on the left posterior calf. I am assuming this is been venous he is also a diabetic. His ABI in our clinic was ?noncompressible however he has easily palpable pulses on his feet. He complains of a lot of pain in the wound also of the left heel and the upper ?left calf. Some of this may be neuropathy. It is led me to discontinue  Iodoflex reduce his compression but he still complains of pain in fact he says ?he could not take a debridement today. When I first saw this it was completely necrotic surface we have got it down to something that looks a ?reasonably healthy ?I have been  wondering about biopsying this area however he is on Coumadin and with the pain I have put this off. The major question would be ?an inflammatory ulcer such as pyoderma. The patient is not aware of how this started. He does however have a wound history on both legs. ?6/29; patient presents for 1 week follow-up. He has been using Hydrofera Blue under Kerlix/Coban. He has tolerated this well. He currently denies ?signs of infection. He also declines debridement today. He he states it is painful and does not want to have this done. ?7/6; Patient presents for 1 week follow-up. He has been using Hydrofera Blue under Kerlix/Coban. He denies signs of infection. He declines ?debridement today. ?7/15; patient presents for 1 week follow-up. He has been using Hydrofera Blue under Kerlix/Coban. He denies signs of infection today. He is ?agreeable to having debridement done today. ?7/20; patient presents for 1 week follow-up. He is in good spirits today. He has been using Hydrofera Blue under Kerlix/Coban. He denies signs of ?infection today. ?7/27; patient presents for 1 week follow-up. He is in good spirits today. He has no issues or complaints today. He denies signs of infection. ?08/12/2020 upon evaluation today patient appears to be doing better in regard to his wound. He has been tolerating the dressing changes without ?complication. With that being said he did have arterial studies and does show that he has a TBI on the right of 0.90 and a TBI on the left of 0.76 ?which is excellent. There is no evidence of arterial compromise at this point. ?8/17; patient presents for 1 week follow-up. He has no issues or complaints today. He denies signs of infection. ?8/24; patient presents for 1 week follow-up. He has no issues or complaints today. He denies signs of infection. He continues to tolerate the ?compression wrap well. ?8/31; patient presents for 1 week follow-up. He has no issues or complaints today. He denies signs of  infection. He is in good spirits today. ?9/7; patient presents for 1 week follow-up. He is tolerated the compression wrap well. He has no issues or complaints today. He denies signs of ?infection. ?9/14; patient presents for 1 week follow-up. He has no issues or complaints today. He denies signs of infection. He is in excellent spirits today. ?9/21; patient presents for 1 week follow-up. He has no issues or complaints today. ?9/28; patient presents for 1 week follow-up. He has tolerated the compression wrap well. He has no issues or complaints today. ?10/5; patient presents for 1 week follow-up. He has no issues or complaints today. ?10/12; patient presents for 1 week follow-up. He has no issues or complaints today. He denies signs of infection. TALAN, GILDNER ([id number]) ?Readmission: ?04-08-2021 upon evaluation today patient appears to be doing poorly in regard to his right lower extremity. He is being seen for reevaluation here in ?the clinic he was last discharged healed in October 2022. Subsequently at that time it was noted in the chart that he was told to be wearing ?compression socks unfortunately the patient tells me he never remembers being told that. I did discuss this with him today as well as Hosie Poisson ?discussing it with him today again. Obviously he does know at this point once he  is healed he should be wearing compression socks all the time ?from the time he gets up in the morning to when he goes to sleep at night day in and day out. I think this is the only way that you can to keep ?things under control. Otherwise his medical history has not changed his arterial study in the past was excellent and did not appear to show any ?signs of issue at this point which is great news as well. ?04-15-2021 upon evaluation today patient's wound is actually looking much drier than what it was last week. Again I do believe the infection is ?showing signs of improvement although he is having pain for sore I think  this has to do more with the dryness of the wound at this time as ?opposed to the infection that we were noted in the last week. Fortunately I do not see any evidence of active infection locally or systemically at

## 2021-04-22 NOTE — ED Triage Notes (Signed)
Pt sts that he was at the wound center and they wanted him to get his right lower leg wound checked for continued infection. ?

## 2021-04-22 NOTE — ED Provider Notes (Signed)
? ?[facility] ?Provider Note ? ? ? Event Date/Time  ? First MD Initiated Contact with Patient 04/22/21 1713   ?  (approximate) ? ? ?History  ? ?Wound Check ? ? ?HPI ? ?Shane Bean is a 66 y.o. male with a history of diabetes, DVT, hypertension, ascending aortic dissection who presents for evaluation of chronic right lower extremity venous stasis wounds.  He has this managed by the wound care center over the last several weeks.  Referred over by wound care for evaluation/lab work notable concern for possible infection.  Patient is afebrile.  He reports he is taking amoxicillin prescribed for him by the wound care center.  He states overall he feels well ?  ? ? ?Physical Exam  ? ?Triage Vital Signs: ?ED Triage Vitals [04/22/21 1749]  ?Enc Vitals Group  ?   BP (!) 165/88  ?   Pulse Rate 89  ?   Resp 18  ?   Temp 98.5 ?F (36.9 ?C)  ?   Temp Source Oral  ?   SpO2 98 %  ?   Weight 127 kg (280 lb)  ?   Height   ?   Head Circumference   ?   Peak Flow   ?   Pain Score 7  ?   Pain Loc   ?   Pain Edu?   ?   Excl. in [place]?   ? ? ?Most recent vital signs: ?Vitals:  ? 04/22/21 1749 04/22/21 1941  ?BP: (!) 165/88 (!) 158/78  ?Pulse: 89 90  ?Resp: 18 17  ?Temp: 98.5 ?F (36.9 ?C) 98.7 ?F (37.1 ?C)  ?SpO2: 98% 98%  ? ? ? ?General: Awake, no distress.  ?CV:  Good peripheral perfusion.  ?Resp:  Normal effort.  ?Abd:  No distention.  ?Other:  Right lower extremity: Lateral aspects of lower extremity.  Venous stasis ulcer granulation eschar/necrosis noted, no surrounding erythema, edema to the extremity noted which is apparently chronic ? ? ?ED Results / Procedures / Treatments  ? ?Labs ?(all labs ordered are listed, but only abnormal results are displayed) ?Labs Reviewed  ?CBC - Abnormal; Notable for the following components:  ?    Result Value  ? Hemoglobin 10.6 (*)   ? HCT 35.6 (*)   ? MCV 77.9 (*)   ? MCH 23.2 (*)   ? MCHC 29.8 (*)   ? RDW 16.6 (*)   ? All other components within normal limits   ?COMPREHENSIVE METABOLIC PANEL - Abnormal; Notable for the following components:  ? Glucose, Bld 315 (*)   ? Albumin 3.3 (*)   ? All other components within normal limits  ?LACTIC ACID, PLASMA - Abnormal; Notable for the following components:  ? Lactic Acid, Venous 2.0 (*)   ? All other components within normal limits  ? ? ? ?EKG ? ? ? ? ?RADIOLOGY ? ? ? ? ?PROCEDURES: ? ?Critical Care performed:  ? ?Procedures ? ? ?MEDICATIONS ORDERED IN ED: ?Medications - No data to display ? ? ?IMPRESSION / MDM / ASSESSMENT AND PLAN / ED COURSE  ?I reviewed the triage vital signs and the nursing notes. ? ?Patient presents for evaluation of possible wound infection.  Patient is afebrile overall well-appearing, no evidence of obvious cellulitis on exam ? ?CBC is reassuring, white blood cell count is normal.  Pending lactic acid. ? ?Lactic acid is borderline at 2.0. ? ?Recommended admission to the patient however he declined, he states that he would just like  to have some medication to help with his edema which he thinks is what is causing his discomfort. ? ?We will prescribe Lasix for short course, do not believe he is taking spironolactone at this time.  He will follow-up with his wound care physician and continue his amoxicillin.  He knows to come to the emergency department if any worsening pain redness fevers chills or other new symptoms ? ? ? ? ? ?  ? ? ?FINAL CLINICAL IMPRESSION(S) / ED DIAGNOSES  ? ?Final diagnoses:  ?Visit for wound check  ?Lower extremity edema  ? ? ? ?Rx / DC Orders  ? ?ED Discharge Orders   ? ?      Ordered  ?  furosemide (LASIX) 20 MG tablet  2 times daily       ? 04/22/21 1901  ? ?  ?  ? ?  ? ? ? ?Note:  This document was prepared using Dragon voice recognition software and may include unintentional dictation errors. ?  ?Lavonia Drafts, MD ?04/22/21 2003 ? ?

## 2021-04-23 DIAGNOSIS — E11622 Type 2 diabetes mellitus with other skin ulcer: Secondary | ICD-10-CM | POA: Diagnosis not present

## 2021-04-23 NOTE — Progress Notes (Signed)
HILLIARD, BORGES ([id number]) ?Visit Report for 04/22/2021 ?Arrival Information Details ?Patient Name: Shane Bean ?Date of Service: 04/22/2021 2:45 PM ?Medical Record Number: [id number] ?Patient Account Number: [id number] ?Date of Birth/Sex: 17-Aug-1955 (66 y.o. M) ?Treating RN: Carlene Coria ?Primary Care Joseeduardo Brix: Reeves Dam Other Clinician: ?Referring Ianna Salmela: Reeves Dam ?Treating Vesna Kable/Extender: Jeri Cos ?Weeks in Treatment: 2 ?Visit Information History Since Last Visit ?All ordered tests and consults were completed: No ?Patient Arrived: Ambulatory ?Added or deleted any medications: No ?Arrival Time: 14:43 ?Any new allergies or adverse reactions: No ?Accompanied By: self ?Had a fall or experienced change in No ?Transfer Assistance: None ?activities of daily living that may affect ?Patient Identification Verified: Yes ?risk of falls: ?Secondary Verification Process Completed: Yes ?Signs or symptoms of abuse/neglect since last visito No ?Patient Requires Transmission-Based No ?Hospitalized since last visit: No ?Precautions: ?Implantable device outside of the clinic excluding No ?Patient Has Alerts: Yes ?cellular tissue based products placed in the center ?Patient Alerts: Patient on Blood ?since last visit: ?Thinner ?Has Dressing in Place as Prescribed: Yes ?ABI R [place] TBI .90 7/22 ?Pain Present Now: No ?ABI L 1.46 TBI .76 ?7/22 ?Electronic Signature(s) ?Signed: 04/23/2021 4:04:50 PM By: Carlene Coria RN ?Entered By: Carlene Coria on 04/22/2021 14:49:08 ?TAJ, NEVINS ([id number]) ?-------------------------------------------------------------------------------- ?Clinic Level of Care Assessment Details ?Patient Name: Shane Bean ?Date of Service: 04/22/2021 2:45 PM ?Medical Record Number: [id number] ?Patient Account Number: [id number] ?Date of Birth/Sex: August 11, 1955 (66 y.o. M) ?Treating RN: Carlene Coria ?Primary Care Breunna Nordmann: Reeves Dam Other Clinician: ?Referring Alizon Schmeling: Reeves Dam ?Treating Melvin Whiteford/Extender: Jeri Cos ?Weeks in Treatment: 2 ?Clinic Level of Care Assessment Items ?TOOL 4 Quantity Score ?X - Use when only an EandM is performed on FOLLOW-UP visit 1 0 ?ASSESSMENTS - Nursing Assessment / Reassessment ?X - Reassessment of Co-morbidities (includes updates in patient status) 1 10 ?X- 1 5 ?Reassessment of Adherence to Treatment Plan ?ASSESSMENTS - Wound and Skin Assessment / Reassessment ?'[]'$  - Simple Wound Assessment / Reassessment - one wound 0 ?X- 2 5 ?Complex Wound Assessment / Reassessment - multiple wounds ?'[]'$  - 0 ?Dermatologic / Skin Assessment (not related to wound area) ?ASSESSMENTS - Focused Assessment ?'[]'$  - Circumferential Edema Measurements - multi extremities 0 ?'[]'$  - 0 ?Nutritional Assessment / Counseling / Intervention ?'[]'$  - 0 ?Lower Extremity Assessment (monofilament, tuning fork, pulses) ?'[]'$  - 0 ?Peripheral Arterial Disease Assessment (using hand held doppler) ?ASSESSMENTS - Ostomy and/or Continence Assessment and Care ?'[]'$  - Incontinence Assessment and Management 0 ?'[]'$  - 0 ?Ostomy Care Assessment and Management (repouching, etc.) ?PROCESS - Coordination of Care ?X - Simple Patient / Family Education for ongoing care 1 15 ?'[]'$  - 0 ?Complex (extensive) Patient / Family Education for ongoing care ?'[]'$  - 0 ?Staff obtains Consents, Records, Test Results / Process Orders ?'[]'$  - 0 ?Staff telephones HHA, Nursing Homes / Clarify orders / etc ?'[]'$  - 0 ?Routine Transfer to another Facility (non-emergent condition) ?'[]'$  - 0 ?Routine Hospital Admission (non-emergent condition) ?'[]'$  - 0 ?New Admissions / Biomedical engineer / Ordering NPWT, Apligraf, etc. ?'[]'$  - 0 ?Emergency Hospital Admission (emergent condition) ?X- 1 10 ?Simple Discharge Coordination ?'[]'$  - 0 ?Complex (extensive) Discharge Coordination ?PROCESS - Special Needs ?'[]'$  - Pediatric / Minor Patient Management 0 ?'[]'$  - 0 ?Isolation Patient Management ?'[]'$  - 0 ?Hearing / Language / Visual special needs ?'[]'$  -  0 ?Assessment of Community assistance (transportation, D/C planning, etc.) ?'[]'$  - 0 ?Additional assistance / Altered mentation ?'[]'$  - 0 ?Support Surface(s) Assessment (bed, cushion, seat, etc.) ?  INTERVENTIONS - Wound Cleansing / Measurement ?JAHMEZ, BILY ([id number]) ?'[]'$  - 0 ?Simple Wound Cleansing - one wound ?X- 2 5 ?Complex Wound Cleansing - multiple wounds ?X- 1 5 ?Wound Imaging (photographs - any number of wounds) ?'[]'$  - 0 ?Wound Tracing (instead of photographs) ?'[]'$  - 0 ?Simple Wound Measurement - one wound ?X- 2 5 ?Complex Wound Measurement - multiple wounds ?INTERVENTIONS - Wound Dressings ?X - Small Wound Dressing one or multiple wounds 1 10 ?X- 1 15 ?Medium Wound Dressing one or multiple wounds ?'[]'$  - 0 ?Large Wound Dressing one or multiple wounds ?'[]'$  - 0 ?Application of Medications - topical ?'[]'$  - 0 ?Application of Medications - injection ?INTERVENTIONS - Miscellaneous ?'[]'$  - External ear exam 0 ?'[]'$  - 0 ?Specimen Collection (cultures, biopsies, blood, body fluids, etc.) ?'[]'$  - 0 ?Specimen(s) / Culture(s) sent or taken to Lab for analysis ?'[]'$  - 0 ?Patient Transfer (multiple staff / Civil Service fast streamer / Similar devices) ?'[]'$  - 0 ?Simple Staple / Suture removal (25 or less) ?'[]'$  - 0 ?Complex Staple / Suture removal (26 or more) ?'[]'$  - 0 ?Hypo / Hyperglycemic Management (close monitor of Blood Glucose) ?'[]'$  - 0 ?Ankle / Brachial Index (ABI) - do not check if billed separately ?X- 1 5 ?Vital Signs ?Has the patient been seen at the hospital within the last three years: Yes ?Total Score: 105 ?Level Of Care: New/Established - Level ?3 ?Electronic Signature(s) ?Signed: 04/23/2021 4:04:50 PM By: Carlene Coria RN ?Entered By: Carlene Coria on 04/22/2021 15:09:43 ?PARRIS, CUDWORTH ([id number]) ?-------------------------------------------------------------------------------- ?Encounter Discharge Information Details ?Patient Name: Shane Bean ?Date of Service: 04/22/2021 2:45 PM ?Medical Record Number: [id number] ?Patient  Account Number: [id number] ?Date of Birth/Sex: 07-27-1955 (66 y.o. M) ?Treating RN: Carlene Coria ?Primary Care Tobiah Celestine: Reeves Dam Other Clinician: ?Referring Micayla Brathwaite: Reeves Dam ?Treating Major Santerre/Extender: Jeri Cos ?Weeks in Treatment: 2 ?Encounter Discharge Information Items ?Discharge Condition: Stable ?Ambulatory Status: Ambulatory ?Discharge Destination: Home ?Transportation: Private Auto ?Accompanied By: self ?Schedule Follow-up Appointment: Yes ?Clinical Summary of Care: Patient Declined ?Electronic Signature(s) ?Signed: 04/23/2021 4:04:50 PM By: Carlene Coria RN ?Entered By: Carlene Coria on 04/22/2021 15:10:55 ?JEVAUGHN, DEGOLLADO ([id number]) ?-------------------------------------------------------------------------------- ?Lower Extremity Assessment Details ?Patient Name: Shane Bean ?Date of Service: 04/22/2021 2:45 PM ?Medical Record Number: [id number] ?Patient Account Number: [id number] ?Date of Birth/Sex: 11-Jun-1955 (66 y.o. M) ?Treating RN: Carlene Coria ?Primary Care Cherlyn Syring: Reeves Dam Other Clinician: ?Referring Ivanka Kirshner: Reeves Dam ?Treating Judd Mccubbin/Extender: Jeri Cos ?Weeks in Treatment: 2 ?Edema Assessment ?Assessed: [Left: No] [Right: No] ?Edema: [Left: Ye] [Right: s] ?Calf ?Left: Right: ?Point of Measurement: 35 cm From Medial Instep 43 cm ?Ankle ?Left: Right: ?Point of Measurement: 10 cm From Medial Instep 28 cm ?Knee To Floor ?Left: Right: ?From Medial Instep 43 cm ?Vascular Assessment ?Pulses: ?Dorsalis Pedis ?Palpable: [Right:Yes] ?Electronic Signature(s) ?Signed: 04/23/2021 4:04:50 PM By: Carlene Coria RN ?Entered By: Carlene Coria on 04/22/2021 14:57:37 ?JACSON, RAPAPORT ([id number]) ?-------------------------------------------------------------------------------- ?Multi Wound Chart Details ?Patient Name: Shane Bean ?Date of Service: 04/22/2021 2:45 PM ?Medical Record Number: [id number] ?Patient Account Number: [id number] ?Date of Birth/Sex: 07-02-1955 (66 y.o.  M) ?Treating RN: Carlene Coria ?Primary Care Draylon Mercadel: Reeves Dam Other Clinician: ?Referring Shervin Cypert: Reeves Dam ?Treating Eular Panek/Extender: Jeri Cos ?Weeks in Treatment: 2 ?Vital Signs ?Height(in): 70 ?Pulse(bpm)

## 2021-04-23 NOTE — Progress Notes (Addendum)
Shane Bean, Shane Bean ([id number]) ?Visit Report for 04/23/2021 ?Physician Orders Details ?Patient Name: Shane Bean ?Date of Service: 04/23/2021 1:45 PM ?Medical Record Number: [id number] ?Patient Account Number: [id number] ?Date of Birth/Sex: 1955-06-26 (66 y.o. M) ?Treating RN: Cornell Barman ?Primary Care Provider: Reeves Dam Other Clinician: ?Referring Provider: Reeves Dam ?Treating Provider/Extender: Jeri Cos ?Weeks in Treatment: 2 ?Verbal / Phone Orders: No ?Diagnosis Coding ?Follow-up Appointments ?o Return Appointment in 1 week. ?Bathing/ Shower/ Hygiene ?o May shower; gently cleanse wound with antibacterial soap, rinse and pat dry prior to dressing wounds ?Edema Control - Lymphedema / Segmental Compressive Device / Other ?o Elevate, Exercise Daily and Avoid Standing for Long Periods of Time. ?o Elevate legs to the level of the heart and pump ankles as often as possible ?o Elevate leg(s) parallel to the floor when sitting. ?Wound Treatment ?Wound #2 - Lower Leg Wound Laterality: Right, Anterior ?Cleanser: Dakin 16 (oz) 0.25 (DME) (Generic) 1 x Per Day/30 Days ?Discharge Instructions: Use as directed. ?Secondary Dressing: ABD Pad 5x9 (in/in) (DME) (Generic) 1 x Per Day/30 Days ?Discharge Instructions: Cover with ABD pad ?Secured With: Medipore Tape - 18M Medipore H Soft Cloth Surgical Tape, 2x2 (in/yd) (DME) (Generic) 1 x Per Day/30 Days ?Secured With: [employer] or Non-Sterile 6-ply 4.5x4 (yd/yd) 1 x Per Day/30 Days ?Discharge Instructions: Apply Kerlix as directed ?Wound #3 - Lower Leg Wound Laterality: Right, Lateral ?Cleanser: Dakin 16 (oz) 0.25 (DME) (Generic) 1 x Per Day/30 Days ?Discharge Instructions: Use as directed. ?Secondary Dressing: ABD Pad 5x9 (in/in) (DME) (Generic) 1 x Per Day/30 Days ?Discharge Instructions: Cover with ABD pad ?Secured With: Medipore Tape - 18M Medipore H Soft Cloth Surgical Tape, 2x2 (in/yd) (DME) (Generic) 1 x Per Day/30 Days ?Secured With: [employer] or Non-Sterile 6-ply 4.5x4 (yd/yd) 1 x Per Day/30 Days ?Discharge Instructions: Apply Kerlix as directed ?Electronic Signature(s) ?Signed: 04/23/2021 2:32:46 PM By: Gretta Cool, BSN, RN, CWS, Kim RN, BSN ?Signed: 04/23/2021 5:32:35 PM By: Worthy Keeler PA-C ?Previous Signature: 04/23/2021 2:26:15 PM Version By: Worthy Keeler PA-C ?Entered By: Gretta Cool, BSN, RN, CWS, Kim on 04/23/2021 14:27:26 ?Shane Bean, Shane Bean ([id number]) ?-------------------------------------------------------------------------------- ?SuperBill Details ?Patient Name: Shane Bean, Shane Bean ?Date of Service: 04/23/2021 ?Medical Record Number: [id number] ?Patient Account Number: [id number] ?Date of Birth/Sex: 1955/04/04 (66 y.o. 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

## 2021-04-23 NOTE — Progress Notes (Signed)
AZAREL, BANNER ([id number]) ?Visit Report for 04/23/2021 ?Arrival Information Details ?Patient Name: Shane Bean ?Date of Service: 04/23/2021 1:45 PM ?Medical Record Number: [id number] ?Patient Account Number: [id number] ?Date of Birth/Sex: 1955/03/29 (66 y.o. M) ?Treating RN: Cornell Barman ?Primary Care Keveon Amsler: Reeves Dam Other Clinician: ?Referring Katlynne Mckercher: Reeves Dam ?Treating Wynell Halberg/Extender: Jeri Cos ?Weeks in Treatment: 2 ?Visit Information History Since Last Visit ?Added or deleted any medications: No ?Patient Arrived: Ambulatory ?Has Dressing in Place as Prescribed: Yes ?Arrival Time: 14:08 ?Pain Present Now: Yes ?Accompanied By: self ?Transfer Assistance: None ?Patient Identification Verified: Yes ?Secondary Verification Process Completed: Yes ?Patient Requires Transmission-Based No ?Precautions: ?Patient Has Alerts: Yes ?Patient Alerts: Patient on Blood ?Thinner ?ABI R [place] TBI .90 7/22 ?ABI L 1.46 TBI .76 ?7/22 ?Electronic Signature(s) ?Signed: 04/23/2021 2:32:46 PM By: Gretta Cool, BSN, RN, CWS, Kim RN, BSN ?Entered By: Gretta Cool, BSN, RN, CWS, Kim on 04/23/2021 14:18:47 ?BRUK, TUMOLO ([id number]) ?-------------------------------------------------------------------------------- ?Clinic Level of Care Assessment Details ?Patient Name: Shane Bean ?Date of Service: 04/23/2021 1:45 PM ?Medical Record Number: [id number] ?Patient Account Number: [id number] ?Date of Birth/Sex: 1955-04-30 (66 y.o. M) ?Treating RN: Cornell Barman ?Primary Care Persephanie Laatsch: Reeves Dam Other Clinician: ?Referring Esteen Delpriore: Reeves Dam ?Treating Patryck Kilgore/Extender: Jeri Cos ?Weeks in Treatment: 2 ?Clinic Level of Care Assessment Items ?TOOL 4 Quantity Score ?'[]'$  - Use when only an EandM is performed on FOLLOW-UP visit 0 ?ASSESSMENTS - Nursing Assessment / Reassessment ?'[]'$  - Reassessment of Co-morbidities (includes updates in patient status) 0 ?'[]'$  - 0 ?Reassessment of Adherence to Treatment Plan ?ASSESSMENTS - Wound and  Skin Assessment / Reassessment ?X - Simple Wound Assessment / Reassessment - one wound 1 5 ?'[]'$  - 0 ?Complex Wound Assessment / Reassessment - multiple wounds ?'[]'$  - 0 ?Dermatologic / Skin Assessment (not related to wound area) ?ASSESSMENTS - Focused Assessment ?'[]'$  - Circumferential Edema Measurements - multi extremities 0 ?'[]'$  - 0 ?Nutritional Assessment / Counseling / Intervention ?'[]'$  - 0 ?Lower Extremity Assessment (monofilament, tuning fork, pulses) ?'[]'$  - 0 ?Peripheral Arterial Disease Assessment (using hand held doppler) ?ASSESSMENTS - Ostomy and/or Continence Assessment and Care ?'[]'$  - Incontinence Assessment and Management 0 ?'[]'$  - 0 ?Ostomy Care Assessment and Management (repouching, etc.) ?PROCESS - Coordination of Care ?X - Simple Patient / Family Education for ongoing care 1 15 ?'[]'$  - 0 ?Complex (extensive) Patient / Family Education for ongoing care ?'[]'$  - 0 ?Staff obtains Consents, Records, Test Results / Process Orders ?'[]'$  - 0 ?Staff telephones HHA, Nursing Homes / Clarify orders / etc ?'[]'$  - 0 ?Routine Transfer to another Facility (non-emergent condition) ?'[]'$  - 0 ?Routine Hospital Admission (non-emergent condition) ?'[]'$  - 0 ?New Admissions / Biomedical engineer / Ordering NPWT, Apligraf, etc. ?'[]'$  - 0 ?Emergency Hospital Admission (emergent condition) ?X- 1 10 ?Simple Discharge Coordination ?'[]'$  - 0 ?Complex (extensive) Discharge Coordination ?PROCESS - Special Needs ?'[]'$  - Pediatric / Minor Patient Management 0 ?'[]'$  - 0 ?Isolation Patient Management ?'[]'$  - 0 ?Hearing / Language / Visual special needs ?'[]'$  - 0 ?Assessment of Community assistance (transportation, D/C planning, etc.) ?'[]'$  - 0 ?Additional assistance / Altered mentation ?'[]'$  - 0 ?Support Surface(s) Assessment (bed, cushion, seat, etc.) ?INTERVENTIONS - Wound Cleansing / Measurement ?MICHAIAH, HOL[place] ([id number]) ?'[]'$  - 0 ?Simple Wound Cleansing - one wound ?'[]'$  - 0 ?Complex Wound Cleansing - multiple wounds ?'[]'$  - 0 ?Wound Imaging (photographs - any  number of wounds) ?'[]'$  - 0 ?Wound Tracing (instead of photographs) ?'[]'$  - 0 ?Simple Wound Measurement - one wound ?'[]'$  - 0 ?Complex  Wound Measurement - multiple wounds ?INTERVENTIONS - Wound Dressings ?'[]'$  - Small Wound Dressing one or multiple wounds 0 ?'[]'$  - 0 ?Medium Wound Dressing one or multiple wounds ?X- 1 20 ?Large Wound Dressing one or multiple wounds ?'[]'$  - 0 ?Application of Medications - topical ?'[]'$  - 0 ?Application of Medications - injection ?INTERVENTIONS - Miscellaneous ?'[]'$  - External ear exam 0 ?'[]'$  - 0 ?Specimen Collection (cultures, biopsies, blood, body fluids, etc.) ?'[]'$  - 0 ?Specimen(s) / Culture(s) sent or taken to Lab for analysis ?'[]'$  - 0 ?Patient Transfer (multiple staff / Civil Service fast streamer / Similar devices) ?'[]'$  - 0 ?Simple Staple / Suture removal (25 or less) ?'[]'$  - 0 ?Complex Staple / Suture removal (26 or more) ?'[]'$  - 0 ?Hypo / Hyperglycemic Management (close monitor of Blood Glucose) ?'[]'$  - 0 ?Ankle / Brachial Index (ABI) - do not check if billed separately ?'[]'$  - 0 ?Vital Signs ?Has the patient been seen at the hospital within the last three years: Yes ?Total Score: 50 ?Level Of Care: New/Established - ?Level 2 ?Electronic Signature(s) ?Signed: 04/23/2021 2:32:46 PM By: Gretta Cool, BSN, RN, CWS, Kim RN, BSN ?Entered By: Gretta Cool, BSN, RN, CWS, Kim on 04/23/2021 14:23:54 ?COTY, LARSH ([id number]) ?-------------------------------------------------------------------------------- ?Encounter Discharge Information Details ?Patient Name: Shane Bean ?Date of Service: 04/23/2021 1:45 PM ?Medical Record Number: [id number] ?Patient Account Number: [id number] ?Date of Birth/Sex: [birth year] (66 y.o. M) ?Treating RN: Cornell Barman ?Primary Care Robertha Staples: Reeves Dam Other Clinician: ?Referring Gurtha Picker: Reeves Dam ?Treating Avaley Coop/Extender: Jeri Cos ?Weeks in Treatment: 2 ?Encounter Discharge Information Items ?Discharge Condition: Stable ?Ambulatory Status: Ambulatory ?Discharge Destination:  Home ?Transportation: Private Auto ?Accompanied By: self ?Schedule Follow-up Appointment: Yes ?Clinical Summary of Care: ?Electronic Signature(s) ?Signed: 04/23/2021 2:32:46 PM By: Gretta Cool, BSN, RN, CWS, Kim RN, BSN ?Entered By: Gretta Cool, BSN, RN, CWS, Kim on 04/23/2021 14:23:16 ?RAMELL, WACHA ([id number]) ?-------------------------------------------------------------------------------- ?Wound Assessment Details ?Patient Name: Shane Bean ?Date of Service: 04/23/2021 1:45 PM ?Medical Record Number: [id number] ?Patient Account Number: [id number] ?Date of Birth/Sex: [birth year] (66 y.o. M) ?Treating RN: Cornell Barman ?Primary Care Riata Ikeda: Reeves Dam Other Clinician: ?Referring Devell Parkerson: Reeves Dam ?Treating Azaria Stegman/Extender: Jeri Cos ?Weeks in Treatment: 2 ?Wound Status ?Wound Number: 2 ?Primary Etiology: Diabetic Wound/Ulcer of the Lower Extremity ?Wound Location: Right, Anterior Lower Leg ?Wound Status: Open ?Wounding Event: Gradually Appeared ?Comorbid History: Hypertension, Type II Diabetes, Neuropathy ?Date Acquired: 03/03/2021 ?Weeks Of Treatment: 2 ?Clustered Wound: No ?Wound Measurements ?Length: (cm) 1 ?Width: (cm) 0.5 ?Depth: (cm) 0.1 ?Area: (cm?) 0.393 ?Volume: (cm?) 0.039 ?% Reduction in Area: 49.9% ?% Reduction in Volume: 50.6% ?Epithelialization: None ?Wound Description ?Classification: Grade 2 ?Exudate Amount: Medium ?Exudate Type: Serosanguineous ?Exudate Color: red, brown ?Foul Odor After Cleansing: No ?Slough/Fibrino Yes ?Wound Bed ?Granulation Amount: Medium (34-66%) Exposed Structure ?Necrotic Amount: Medium (34-66%) ?Fascia Exposed: No ?Necrotic Quality: Adherent Slough ?Fat Layer (Subcutaneous Tissue) Exposed: Yes ?Tendon Exposed: No ?Muscle Exposed: No ?Joint Exposed: No ?Bone Exposed: No ?Treatment Notes ?Wound #2 (Lower Leg) Wound Laterality: Right, Anterior ?Cleanser ?Peri-Wound Care ?Topical ?Primary Dressing ?Secondary Dressing ?ABD Pad 5x9 (in/in) ?Discharge Instruction: Cover  with ABD pad ?Secured With ?Medipore Tape - 48M Medipore H Soft Cloth Surgical Tape, 2x2 (in/yd) ?Kerlix Roll Sterile or Non-Sterile 6-ply 4.5x4 (yd/yd) ?Discharge Instruction: Apply Kerlix as directed ?Compression Wrap ?

## 2021-04-28 ENCOUNTER — Observation Stay
Admission: EM | Admit: 2021-04-28 | Discharge: 2021-04-29 | Disposition: A | Discharge status: Home / Self Care | Payer: Medicare (Managed Care) | Attending: Osteopathic Medicine | Admitting: Osteopathic Medicine

## 2021-04-28 ENCOUNTER — Other Ambulatory Visit: Payer: Self-pay

## 2021-04-28 ENCOUNTER — Emergency Department: Payer: Medicare (Managed Care)

## 2021-04-28 DIAGNOSIS — I872 Venous insufficiency (chronic) (peripheral): Principal | ICD-10-CM | POA: Insufficient documentation

## 2021-04-28 DIAGNOSIS — Z7984 Long term (current) use of oral hypoglycemic drugs: Secondary | ICD-10-CM | POA: Diagnosis not present

## 2021-04-28 DIAGNOSIS — I83012 Varicose veins of right lower extremity with ulcer of calf: Secondary | ICD-10-CM | POA: Diagnosis not present

## 2021-04-28 DIAGNOSIS — L089 Local infection of the skin and subcutaneous tissue, unspecified: Secondary | ICD-10-CM | POA: Insufficient documentation

## 2021-04-28 DIAGNOSIS — I10 Essential (primary) hypertension: Secondary | ICD-10-CM | POA: Diagnosis not present

## 2021-04-28 DIAGNOSIS — Z7982 Long term (current) use of aspirin: Secondary | ICD-10-CM | POA: Insufficient documentation

## 2021-04-28 DIAGNOSIS — M7989 Other specified soft tissue disorders: Secondary | ICD-10-CM | POA: Diagnosis not present

## 2021-04-28 DIAGNOSIS — L97819 Non-pressure chronic ulcer of other part of right lower leg with unspecified severity: Secondary | ICD-10-CM | POA: Insufficient documentation

## 2021-04-28 DIAGNOSIS — E669 Obesity, unspecified: Secondary | ICD-10-CM | POA: Insufficient documentation

## 2021-04-28 DIAGNOSIS — X58XXXD Exposure to other specified factors, subsequent encounter: Secondary | ICD-10-CM | POA: Diagnosis not present

## 2021-04-28 DIAGNOSIS — Z86718 Personal history of other venous thrombosis and embolism: Secondary | ICD-10-CM | POA: Diagnosis not present

## 2021-04-28 DIAGNOSIS — E118 Type 2 diabetes mellitus with unspecified complications: Secondary | ICD-10-CM | POA: Diagnosis not present

## 2021-04-28 DIAGNOSIS — Z87442 Personal history of urinary calculi: Secondary | ICD-10-CM | POA: Insufficient documentation

## 2021-04-28 DIAGNOSIS — M79604 Pain in right leg: Secondary | ICD-10-CM | POA: Diagnosis not present

## 2021-04-28 DIAGNOSIS — M79661 Pain in right lower leg: Secondary | ICD-10-CM

## 2021-04-28 DIAGNOSIS — Z794 Long term (current) use of insulin: Secondary | ICD-10-CM | POA: Diagnosis not present

## 2021-04-28 DIAGNOSIS — Z6841 Body Mass Index (BMI) 40.0 and over, adult: Secondary | ICD-10-CM | POA: Diagnosis not present

## 2021-04-28 DIAGNOSIS — K219 Gastro-esophageal reflux disease without esophagitis: Secondary | ICD-10-CM | POA: Diagnosis not present

## 2021-04-28 DIAGNOSIS — I83009 Varicose veins of unspecified lower extremity with ulcer of unspecified site: Secondary | ICD-10-CM | POA: Diagnosis present

## 2021-04-28 DIAGNOSIS — R600 Localized edema: Secondary | ICD-10-CM | POA: Insufficient documentation

## 2021-04-28 DIAGNOSIS — M79606 Pain in leg, unspecified: Secondary | ICD-10-CM | POA: Diagnosis present

## 2021-04-28 DIAGNOSIS — R739 Hyperglycemia, unspecified: Secondary | ICD-10-CM

## 2021-04-28 DIAGNOSIS — I83018 Varicose veins of right lower extremity with ulcer other part of lower leg: Secondary | ICD-10-CM | POA: Insufficient documentation

## 2021-04-28 DIAGNOSIS — L929 Granulomatous disorder of the skin and subcutaneous tissue, unspecified: Secondary | ICD-10-CM | POA: Diagnosis not present

## 2021-04-28 DIAGNOSIS — S80921D Unspecified superficial injury of right lower leg, subsequent encounter: Secondary | ICD-10-CM | POA: Diagnosis not present

## 2021-04-28 DIAGNOSIS — E1169 Type 2 diabetes mellitus with other specified complication: Secondary | ICD-10-CM | POA: Insufficient documentation

## 2021-04-28 DIAGNOSIS — E785 Hyperlipidemia, unspecified: Secondary | ICD-10-CM | POA: Insufficient documentation

## 2021-04-28 DIAGNOSIS — Z91119 Patient's noncompliance with dietary regimen due to unspecified reason: Secondary | ICD-10-CM | POA: Diagnosis not present

## 2021-04-28 DIAGNOSIS — R601 Generalized edema: Secondary | ICD-10-CM | POA: Insufficient documentation

## 2021-04-28 DIAGNOSIS — Z792 Long term (current) use of antibiotics: Secondary | ICD-10-CM | POA: Diagnosis not present

## 2021-04-28 DIAGNOSIS — Z79899 Other long term (current) drug therapy: Secondary | ICD-10-CM | POA: Insufficient documentation

## 2021-04-28 DIAGNOSIS — L97212 Non-pressure chronic ulcer of right calf with fat layer exposed: Secondary | ICD-10-CM

## 2021-04-28 DIAGNOSIS — Z7901 Long term (current) use of anticoagulants: Secondary | ICD-10-CM | POA: Diagnosis not present

## 2021-04-28 DIAGNOSIS — I825Y1 Chronic embolism and thrombosis of unspecified deep veins of right proximal lower extremity: Secondary | ICD-10-CM | POA: Diagnosis not present

## 2021-04-28 DIAGNOSIS — E1165 Type 2 diabetes mellitus with hyperglycemia: Secondary | ICD-10-CM | POA: Diagnosis not present

## 2021-04-28 DIAGNOSIS — L97909 Non-pressure chronic ulcer of unspecified part of unspecified lower leg with unspecified severity: Secondary | ICD-10-CM | POA: Diagnosis present

## 2021-04-28 DIAGNOSIS — Z7182 Exercise counseling: Secondary | ICD-10-CM | POA: Diagnosis not present

## 2021-04-28 DIAGNOSIS — E66813 Obesity, class 3: Secondary | ICD-10-CM | POA: Diagnosis present

## 2021-04-28 DIAGNOSIS — I82401 Acute embolism and thrombosis of unspecified deep veins of right lower extremity: Secondary | ICD-10-CM | POA: Diagnosis present

## 2021-04-28 LAB — CBC
HCT: 34.9 % — ABNORMAL LOW (ref 39.0–52.0)
Hemoglobin: 10.4 g/dL — ABNORMAL LOW (ref 13.0–17.0)
MCH: 23.2 pg — ABNORMAL LOW (ref 26.0–34.0)
MCHC: 29.8 g/dL — ABNORMAL LOW (ref 30.0–36.0)
MCV: 77.7 fL — ABNORMAL LOW (ref 80.0–100.0)
Platelets: 218 10*3/uL (ref 150–400)
RBC: 4.49 MIL/uL (ref 4.22–5.81)
RDW: 16.4 % — ABNORMAL HIGH (ref 11.5–15.5)
WBC: 7.3 10*3/uL (ref 4.0–10.5)
nRBC: 0 % (ref 0.0–0.2)

## 2021-04-28 LAB — PROTIME-INR
INR: 2 — ABNORMAL HIGH (ref 0.8–1.2)
Prothrombin Time: 22.8 seconds — ABNORMAL HIGH (ref 11.4–15.2)

## 2021-04-28 LAB — BASIC METABOLIC PANEL
Anion gap: 8 (ref 5–15)
BUN: 12 mg/dL (ref 8–23)
CO2: 28 mmol/L (ref 22–32)
Calcium: 9.3 mg/dL (ref 8.9–10.3)
Chloride: 98 mmol/L (ref 98–111)
Creatinine, Ser: 1.12 mg/dL (ref 0.61–1.24)
GFR, Estimated: >60 mL/min (ref >60)
Glucose, Bld: 395 mg/dL — ABNORMAL HIGH (ref 70–99)
Potassium: 3.6 mmol/L (ref 3.5–5.1)
Sodium: 134 mmol/L — ABNORMAL LOW (ref 135–145)

## 2021-04-28 LAB — SEDIMENTATION RATE: Sed Rate: 49 mm/hr — ABNORMAL HIGH (ref 0–20)

## 2021-04-28 LAB — C-REACTIVE PROTEIN: CRP: 2.1 mg/dL — ABNORMAL HIGH (ref <1.0)

## 2021-04-28 LAB — PREALBUMIN: Prealbumin: 20.8 mg/dL (ref 18–38)

## 2021-04-28 LAB — LACTIC ACID, PLASMA: Lactic Acid, Venous: 1.9 mmol/L (ref 0.5–1.9)

## 2021-04-28 LAB — GLUCOSE, CAPILLARY: Glucose-Capillary: 234 mg/dL — ABNORMAL HIGH (ref 70–99)

## 2021-04-28 LAB — CBG MONITORING, ED: Glucose-Capillary: 300 mg/dL — ABNORMAL HIGH (ref 70–99)

## 2021-04-28 LAB — BRAIN NATRIURETIC PEPTIDE: B Natriuretic Peptide: 67.5 pg/mL (ref 0.0–100.0)

## 2021-04-28 MED ORDER — SPIRONOLACTONE 25 MG PO TABS: 25.0000 mg | ORAL_TABLET | Freq: Every day | ORAL | Status: DC

## 2021-04-28 MED ORDER — AMLODIPINE BESYLATE 10 MG PO TABS
10.0000 mg | ORAL_TABLET | Freq: Every day | ORAL | Status: DC
  Administered 2021-04-28: 10 mg via ORAL
  Filled 2021-04-28: qty 1

## 2021-04-28 MED ORDER — ASPIRIN EC 81 MG PO TBEC
162.5000 mg | DELAYED_RELEASE_TABLET | Freq: Every day | ORAL | Status: DC
  Administered 2021-04-29: 162 mg via ORAL
  Filled 2021-04-28: qty 3

## 2021-04-28 MED ORDER — WARFARIN SODIUM 6 MG PO TABS: 6.0000 mg | ORAL_TABLET | Freq: Every evening | ORAL | Status: DC

## 2021-04-28 MED ORDER — SODIUM CHLORIDE 0.9 % IV BOLUS
500.0000 mL | Freq: Once | INTRAVENOUS | Status: AC
  Administered 2021-04-28: 500 mL via INTRAVENOUS

## 2021-04-28 MED ORDER — SODIUM CHLORIDE 0.9 % IV SOLN: 250.0000 mL | INTRAVENOUS | Status: DC | PRN

## 2021-04-28 MED ORDER — METOPROLOL TARTRATE 50 MG PO TABS
100.0000 mg | ORAL_TABLET | Freq: Two times a day (BID) | ORAL | Status: DC
  Filled 2021-04-28: qty 2

## 2021-04-28 MED ORDER — HYDROCODONE-ACETAMINOPHEN 5-325 MG PO TABS
1.0000 | ORAL_TABLET | ORAL | Status: DC | PRN
  Administered 2021-04-28 – 2021-04-29 (×3): 1 via ORAL
  Filled 2021-04-28 (×3): qty 1

## 2021-04-28 MED ORDER — ALLOPURINOL 100 MG PO TABS
100.0000 mg | ORAL_TABLET | Freq: Every day | ORAL | Status: DC
  Filled 2021-04-28: qty 1

## 2021-04-28 MED ORDER — SODIUM CHLORIDE 0.9% FLUSH: 3.0000 mL | INTRAVENOUS | Status: DC | PRN

## 2021-04-28 MED ORDER — ACETAMINOPHEN 325 MG PO TABS
650.0000 mg | ORAL_TABLET | Freq: Four times a day (QID) | ORAL | Status: DC | PRN
  Administered 2021-04-29: 650 mg via ORAL
  Filled 2021-04-28: qty 2

## 2021-04-28 MED ORDER — SIMVASTATIN 20 MG PO TABS
10.0000 mg | ORAL_TABLET | Freq: Every evening | ORAL | Status: DC
  Administered 2021-04-28: 10 mg via ORAL
  Filled 2021-04-28: qty 1

## 2021-04-28 MED ORDER — WARFARIN - PHARMACIST DOSING INPATIENT
Freq: Every day | Status: DC
Start: 2021-04-29 — End: 2021-04-29
  Filled 2021-04-28: qty 1

## 2021-04-28 MED ORDER — INSULIN ASPART 100 UNIT/ML IJ SOLN
0.0000 [IU] | Freq: Three times a day (TID) | INTRAMUSCULAR | Status: DC
  Administered 2021-04-28: 11 [IU] via SUBCUTANEOUS
  Administered 2021-04-29: 15 [IU] via SUBCUTANEOUS
  Administered 2021-04-29: 7 [IU] via SUBCUTANEOUS
  Filled 2021-04-28 (×3): qty 1

## 2021-04-28 MED ORDER — DOXYCYCLINE HYCLATE 100 MG PO TABS
100.0000 mg | ORAL_TABLET | Freq: Two times a day (BID) | ORAL | Status: DC
  Administered 2021-04-28 – 2021-04-29 (×3): 100 mg via ORAL
  Filled 2021-04-28 (×3): qty 1

## 2021-04-28 MED ORDER — AMLODIPINE BESYLATE 5 MG PO TABS: 10.0000 mg | ORAL_TABLET | Freq: Every day | ORAL | Status: DC

## 2021-04-28 MED ORDER — DOXAZOSIN MESYLATE 8 MG PO TABS
8.0000 mg | ORAL_TABLET | Freq: Every day | ORAL | Status: DC
Start: 2021-04-28 — End: 2021-04-28

## 2021-04-28 MED ORDER — ONDANSETRON HCL 4 MG/2ML IJ SOLN
4.0000 mg | Freq: Once | INTRAMUSCULAR | Status: AC
  Administered 2021-04-28: 4 mg via INTRAVENOUS
  Filled 2021-04-28: qty 2

## 2021-04-28 MED ORDER — ONDANSETRON HCL 4 MG PO TABS: 4.0000 mg | ORAL_TABLET | Freq: Four times a day (QID) | ORAL | Status: DC | PRN

## 2021-04-28 MED ORDER — METOPROLOL TARTRATE 50 MG PO TABS
200.0000 mg | ORAL_TABLET | Freq: Every day | ORAL | Status: DC
  Administered 2021-04-29: 200 mg via ORAL
  Filled 2021-04-28: qty 4

## 2021-04-28 MED ORDER — ACETAMINOPHEN 650 MG RE SUPP: 650.0000 mg | Freq: Four times a day (QID) | RECTAL | Status: DC | PRN

## 2021-04-28 MED ORDER — WARFARIN SODIUM 6 MG PO TABS
6.0000 mg | ORAL_TABLET | Freq: Every day | ORAL | Status: DC
  Administered 2021-04-28: 6 mg via ORAL
  Filled 2021-04-28 (×2): qty 1

## 2021-04-28 MED ORDER — DAKINS (1/4 STRENGTH) 0.125 % EX SOLN
CUTANEOUS | Status: DC
  Filled 2021-04-28: qty 473

## 2021-04-28 MED ORDER — ONDANSETRON HCL 4 MG/2ML IJ SOLN
4.0000 mg | Freq: Four times a day (QID) | INTRAMUSCULAR | Status: DC | PRN
Start: 2021-04-28 — End: 2021-04-29

## 2021-04-28 MED ORDER — MORPHINE SULFATE (PF) 4 MG/ML IV SOLN
4.0000 mg | Freq: Once | INTRAVENOUS | Status: AC
  Administered 2021-04-28: 4 mg via INTRAVENOUS
  Filled 2021-04-28: qty 1

## 2021-04-28 MED ORDER — FAMOTIDINE 20 MG PO TABS
20.0000 mg | ORAL_TABLET | Freq: Every day | ORAL | Status: DC
Start: 2021-04-29 — End: 2021-04-29
  Administered 2021-04-29: 20 mg via ORAL
  Filled 2021-04-28: qty 1

## 2021-04-28 MED ORDER — SODIUM CHLORIDE 0.9% FLUSH
3.0000 mL | Freq: Two times a day (BID) | INTRAVENOUS | Status: DC
  Administered 2021-04-28 – 2021-04-29 (×2): 3 mL via INTRAVENOUS

## 2021-04-28 NOTE — Assessment & Plan Note (Addendum)
Non compliant  ?A1C >10 ?Follow w/ PCP ?Educated on importance of Glc control to avoid worsening / recurrent infection and other complications ?

## 2021-04-28 NOTE — Assessment & Plan Note (Signed)
BMI 40.18 kg/m2 ?Complicates overall prognosis and care ?Lifestyle modification and exercise has been discussed with patient ?

## 2021-04-28 NOTE — Consult Note (Addendum)
WOC Nurse Consult Note: ?Reason for Consult: Consult requested for right leg.  Performed remotely after review of progress notes and photos in the EMR.  ?Pt is followed by the outpatient wound care center, most recently on 4/21.  They have ordered Dakins solution to be applied Q day, with ace wrap to provide light compression. ?Wound type: Right anterior and posterior calf with chronic full thickness wounds scattered with narrow intact skin bridges in places, generalized edema.  Wounds are 40% loose eschar, 60% red, mod amt tan drainage. Pt has been placed on systemic antibiotics. ?Dressing procedure/placement/frequency: Pt should resume follow-up with the outpatient wound care center after discharge. Continue current plan of care as ordered by the outpatient wound care center. Topical treatment orders provided for bedside nurses to perform as follows: Apply Dakin's moistened gauze to right leg wounds Q day, then cover with ABD pads.  Wrap with kerlex in a spiral fashion, beginning just behind toes, to below knee, then in the same manner with ace wrap. ?Please re-consult if further assistance is needed.  Thank-you,  ?Julien Girt MSN, RN, [place], [place], CNS ?[phone]  ?  ?

## 2021-04-28 NOTE — H&P (Signed)
?History and Physical  ? ? ?Patient: Shane Bean IOM:[id number] DOB: [birth year] ?DOA: 04/28/2021 ?DOS: the patient was seen and examined on 04/28/2021 ?PCP: Shane Pupa, NP  ?Patient coming from: Home ? ?Chief Complaint:  ?Chief Complaint  ?Patient presents with  ? Leg Pain  ? ?HPI: AARIK Bean is a 66 y.o. 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 ?Patient was seen in the ER on 04/20 for evaluation of increased drainage and pain in his right lower extremity wound.  Admission was recommended at the time but he opted to go home on oral antibiotic therapy and diuretic and come back for reevaluation. ?He notes that there has been some improvement in his purulent drainage but not completely resolved but complains of worsening pain which she rates a 7 x 10 in intensity at its worst.  He denies having any fever or chills, no cough, no shortness of breath, no chest pain, no urinary symptoms, no changes in his bowel habits, no dizziness or lightheadedness. ? ? ?Review of Systems: As mentioned in the history of present illness. All other systems reviewed and are negative. ?Past Medical History:  ?Diagnosis Date  ? Arthritis   ? Ascending aortic dissection (HCC)   ? Diabetes mellitus without complication ([place])   ? DVT (deep venous thrombosis) ([place])   ? legs and lungs   ? Hyperlipemia   ? Hypertension   ? Kidney stones   ? ?Past Surgical History:  ?Procedure Laterality Date  ? CARDIAC SURGERY    ? COLONOSCOPY WITH PROPOFOL N/A 04/04/2017  ? Procedure: COLONOSCOPY WITH PROPOFOL;  Surgeon: Shane Lame, MD;  Location: [facility] ENDOSCOPY;  Service: Endoscopy;  Laterality: N/A;  ? COLONOSCOPY WITH PROPOFOL N/A 05/21/2019  ? Procedure: COLONOSCOPY WITH  PROPOFOL;  Surgeon: Shane Lame, MD;  Location: [facility] ENDOSCOPY;  Service: Endoscopy;  Laterality: N/A;  ? DENTAL SURGERY    ? ESOPHAGOGASTRODUODENOSCOPY (EGD) WITH PROPOFOL N/A 04/04/2017  ? Procedure: ESOPHAGOGASTRODUODENOSCOPY (EGD) WITH PROPOFOL;  Surgeon: Shane Lame, MD;  Location: [facility] ENDOSCOPY;  Service: Endoscopy;  Laterality: N/A;  ? LAPAROSCOPIC RIGHT HEMI COLECTOMY Right 08/17/2017  ? Procedure: LAPAROSCOPIC  RIGHT HEMI COLECTOMY, COLONOSCOPY, UMBILICAL HERNIA REPAIR;  Surgeon: Shane Roup, MD;  Location: [facility] ORS;  Service: General;  Laterality: Right;  ? REPAIR THORACIC AORTA    ? ?Social History:  reports that he has never smoked. He has never used smokeless tobacco. He reports current alcohol use of about 12.0 standard drinks per week. He reports that he does not use drugs. ? ?Allergies  ?Allergen Reactions  ? Chlorthalidone Swelling  ?  Testicular/Scrotum edema  ? Hydralazine Other (See Comments)  ?  GI Upset (intolerance)  ? Lisinopril Swelling  ?  Facial Edema (intolerance)  ? ? ?No family history on file. ? ?Prior to Admission medications   ?Medication Sig Start Date End Date Taking? Authorizing Provider  ?allopurinol (ZYLOPRIM) 100 MG tablet Take 100 mg by mouth daily at 3 pm.    [provider]  ?amLODipine (NORVASC) 10 MG tablet Take 10 mg by mouth at bedtime.    [provider]  ?amoxicillin-clavulanate (AUGMENTIN) 875-125 MG tablet Take 1 tablet by mouth 2 (two) times daily. 04/24/21   [provider]  ?aspirin EC 325 MG tablet Take 162.5  mg by mouth daily.  10/15/13   [provider]  ?doxazosin (CARDURA) 8 MG tablet Take 8 mg by mouth at bedtime.     [provider]  ?furosemide (LASIX) 20 MG tablet Take 1 tablet (20 mg total) by mouth 2 (two) times daily for 5 days. 04/22/21 04/27/21  Shane Drafts, MD  ?glipiZIDE (GLUCOTROL) 5 MG tablet Take 10 mg by mouth 2 (two) times daily.    [provider]  ?HYDROcodone-acetaminophen (NORCO)  5-325 MG tablet Take 1 tablet by mouth every 4 (four) hours as needed for moderate pain. 02/28/18   Dustin Flock, MD  ?metoprolol tartrate (LOPRESSOR) 100 MG tablet Take 100 mg by mouth 2 (two) times daily.    [provider]  ?ranitidine (ZANTAC) 150 MG capsule Take 150 mg by mouth daily at 3 pm.  10/15/13   [provider]  ?simvastatin (ZOCOR) 10 MG tablet Take 10 mg by mouth every evening.    [provider]  ?spironolactone (ALDACTONE) 25 MG tablet Take 25 mg by mouth at bedtime.    [provider]  ?sulfamethoxazole-trimethoprim (BACTRIM DS) 800-160 MG tablet Take 1 tablet by mouth 2 (two) times daily. 05/10/20   Menshew, Dannielle Karvonen, PA-C  ?traMADol (ULTRAM) 50 MG tablet Take 1 tablet (50 mg total) by mouth every 6 (six) hours as needed. 03/25/21 03/25/22  Shane Pear, MD  ?warfarin (COUMADIN) 6 MG tablet Take 6 mg by mouth every evening.    [provider]  ? ? ?Physical Exam: ?Vitals:  ? 04/28/21 0956  ?BP: 129/76  ?Pulse: 74  ?Resp: 18  ?Temp: 98.6 ?F (37 ?C)  ?TempSrc: Oral  ?SpO2: 92%  ? ?Physical Exam ?Vitals and nursing note reviewed.  ?Constitutional:   ?   Appearance: He is obese.  ?HENT:  ?   Head: Normocephalic and atraumatic.  ?   Nose: Nose normal.  ?   Mouth/Throat:  ?   Mouth: Mucous membranes are moist.  ?Eyes:  ?   Conjunctiva/sclera: Conjunctivae normal.  ?Cardiovascular:  ?   Rate and Rhythm: Normal rate and regular rhythm.  ?Pulmonary:  ?   Effort: Pulmonary effort is normal.  ?   Breath sounds: Normal breath sounds.  ?Abdominal:  ?   Palpations: Abdomen is soft.  ?   Comments: Central adiposity, no tenderness  ?Musculoskeletal:     ?   General: Normal range of motion.  ?   Cervical back: Normal range of motion and neck supple.  ?   Comments: Extensive wound involving the lateral portion of the right tibia.  Extends from the knee to the ankle.  Some granulation tissue present  ?Skin: ?   General: Skin is warm and dry.  ?Neurological:  ?    Mental Status: He is alert and oriented to person, place, and time.  ?Psychiatric:     ?   Mood and Affect: Mood normal.     ?   Behavior: Behavior normal.  ? ? ?Data Reviewed: ?Relevant notes from primary care and specialist visits, past discharge summaries as available in EHR, including Care Everywhere. ?Prior diagnostic testing as pertinent to current admission diagnoses ?Updated medications and problem lists for reconciliation ?ED course, including vitals, labs, imaging, treatment and response to treatment ?Triage notes, nursing and pharmacy notes and ED provider's notes ?Notable results as noted in HPI ?Labs reviewed.  Sodium 134, potassium 3.6, chloride 98, bicarb 28, glucose 395, white count 7.3, hemoglobin 10.4, hematocrit 34.9, platelet count 218, lactic  acid 1.9, BNP 67.5 ?There are no new results to review at this time. ? ?Assessment and Plan: ?* Venous stasis ulcers ([place]) ?Patient presents to the ER for evaluation of increased pain and drainage from a nonhealing venous stasis ulcer on his right lower extremity. ?We will place patient on doxycycline 100 mg twice daily ?Wound care consult ? ?Type 2 diabetes mellitus with complication (HCC) ?Patient has diabetes mellitus with complications of hyperglycemia ?He is on glipizide twice a day but notes that he is noncompliant with his diet ?Obtain hemoglobin A1c levels ?Glycemic control with sliding scale insulin ? 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 ?GERD (gastroesophageal reflux disease) ?Stable ?Continue Pepcid ? ?Obesity, Class III, BMI 40-49.9 (morbid obesity) ([place]) ?BMI 40.18 kg/m2 ?Complicates overall prognosis and care ?Lifestyle modification and exercise has been discussed with patient ? ? ? ? ? Advance Care Planning:   Code Status: Full Code  ? ?Consults: Wound care ? ?Family Communication: Greater than 50% of time was spent discussing patient's  condition and plan of care with the patient at the bedside.  All questions and concerns have been addressed.  He verbalizes understanding and agrees with the plan. ? ?Severity of Illness: ?The appropriate patient s

## 2021-04-28 NOTE — Assessment & Plan Note (Addendum)
Patient has a history of DVT and is on chronic anticoagulation therapy with Coumadin, INR is therapeutic and pt reports edema is waxing and waning, no SOB, edema more c/w inflammatory response associated w/ skin infection  ?

## 2021-04-28 NOTE — Consult Note (Signed)
ANTICOAGULATION CONSULT NOTE - Initial Consult ? ?Pharmacy Consult for warfarin ?Indication: Hx of DVT ? ?Allergies  ?Allergen Reactions  ? Chlorthalidone Swelling  ?  Testicular/Scrotum edema  ? Hydralazine Other (See Comments)  ?  GI Upset (intolerance)  ? Lisinopril Swelling  ?  Facial Edema (intolerance)  ? ? ?Patient Measurements: ?  ? ? ?Vital Signs: ?Temp: 98.6 ?F (37 ?C) (04/26 9892) ?Temp Source: Oral (04/26 1194) ?BP: 129/76 (04/26 0956) ?Pulse Rate: 74 (04/26 0956) ? ?Labs: ?Recent Labs  ?  04/28/21 ?1003  ?HGB 10.4*  ?HCT 34.9*  ?PLT 218  ?CREATININE 1.12  ? ? ?Estimated Creatinine Clearance: 88 mL/min (by C-G formula based on SCr of 1.12 mg/dL). ? ? ?Medical History: ?Past Medical History:  ?Diagnosis Date  ? Arthritis   ? Ascending aortic dissection (HCC)   ? Diabetes mellitus without complication ([place])   ? DVT (deep venous thrombosis) ([place])   ? legs and lungs   ? Hyperlipemia   ? Hypertension   ? Kidney stones   ? ? ?Medications:  ?PTA Warfarin regimen: 6 mg daily (TWD 42 mg) ?Last dose 4/26  ? ?DDIs: ?Augmentin 875-125 mg BID -- PTA med started 04/12/21 ?Doxycycline 100 mg Q12H - started 04/28/21  ? ?Assessment: ?66 y.o. 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 Pharmacy has been consulted to manage warfarin regimen while patient admitted. ?INR 2.0 (therapeutic) on admission ? ? ?Date INR Warfarin Dose  ?4/25 -- 6 mg ?4/26 2.0 6 mg ? ?Goal of Therapy:  ?INR 2-3 ?Monitor platelets by anticoagulation protocol: Yes ?  ?Plan:  ?INR 2.0 (therapeutic) on admission ?Continue with home regimen of 6 mg tonight ?INR daily  ? ?Dorothe Pea, PharmD, BCPS ?Clinical Pharmacist   ?04/28/2021,1:23 PM ? ? ?

## 2021-04-28 NOTE — Assessment & Plan Note (Addendum)
Patient presents to the ER for evaluation of increased pain and drainage from a nonhealing venous stasis ulcer on his right lower extremity. ?Started on doxycycline 100 mg twice daily ?Wound care consult: Apply Dakin's moistened gauze to right leg wounds Q day, then cover with ABD pads.  Wrap with kerlex in a spiral fashion, beginning just behind toes, to below knee, then in the same manner with ace wrap ?Morning of discharge, he reports desire to go home, improvement in pain though not resolution of pain. No sepsis.  ?

## 2021-04-28 NOTE — ED Provider Notes (Signed)
? ?[facility] ?Provider Note ? ? ? Event Date/Time  ? First MD Initiated Contact with Patient 04/28/21 1000   ?  (approximate) ? ? ?History  ? ?Leg Pain ? ? ?HPI ? ?Shane Bean is a 66 y.o. male with hx of diabetes, htn, dvt and recent wound infection which was treated with amoxil presents to the ER with worsening pain in wound.  Denies fever or chills.  Patient states he was seen here last week and given furosemide for the swelling in his leg and told to continue his antibiotic.  Also told to return if not improving.  Patient states may be a small amount of improvement but is concerned as the area is very large and he is diabetic.  Does not want to lose his leg.  He is on pills for his diabetes.  His glucose has been elevated recently.  States he does take his medications regularly. ? ?  ? ? ?Physical Exam  ? ?Triage Vital Signs: ?ED Triage Vitals  ?Enc Vitals Group  ?   BP 04/28/21 0956 129/76  ?   Pulse Rate 04/28/21 0956 74  ?   Resp 04/28/21 0956 18  ?   Temp 04/28/21 0956 98.6 ?F (37 ?C)  ?   Temp Source 04/28/21 0956 Oral  ?   SpO2 04/28/21 0956 92 %  ?   Weight --   ?   Height --   ?   Head Circumference --   ?   Peak Flow --   ?   Pain Score 04/28/21 0957 6  ?   Pain Loc --   ?   Pain Edu? --   ?   Excl. in [place]? --   ? ? ?Most recent vital signs: ?Vitals:  ? 04/28/21 0956  ?BP: 129/76  ?Pulse: 74  ?Resp: 18  ?Temp: 98.6 ?F (37 ?C)  ?SpO2: 92%  ? ? ? ?General: Awake, no distress.   ?CV:  Good peripheral perfusion. regular rate and  rhythm ?Resp:  Normal effort.  ?Abd:  No distention.   ?Other:  Right lower leg Large Open Wound on the Dorsum and Stretching around 2 Part of the Calf Posteriorly.  See Pictures on Media. ? ? ?ED Results / Procedures / Treatments  ? ?Labs ?(all labs ordered are listed, but only abnormal results are displayed) ?Labs Reviewed  ?CBC - Abnormal; Notable for the following components:  ?    Result Value  ? Hemoglobin 10.4 (*)   ? HCT 34.9 (*)   ? MCV 77.7  (*)   ? MCH 23.2 (*)   ? MCHC 29.8 (*)   ? RDW 16.4 (*)   ? All other components within normal limits  ?BASIC METABOLIC PANEL - Abnormal; Notable for the following components:  ? Sodium 134 (*)   ? Glucose, Bld 395 (*)   ? All other components within normal limits  ?BRAIN NATRIURETIC PEPTIDE  ?LACTIC ACID, PLASMA  ? ? ? ?EKG ? ? ? ? ?RADIOLOGY ?X-ray of the right tib-fib ? ? ? ?PROCEDURES: ? ? ?Procedures ? ? ?MEDICATIONS ORDERED IN ED: ?Medications  ?allopurinol (ZYLOPRIM) tablet 100 mg (has no administration in time range)  ?aspirin EC tablet 162.5 mg (has no administration in time range)  ?HYDROcodone-acetaminophen (NORCO/VICODIN) 5-325 MG per tablet 1 tablet (has no administration in time range)  ?amLODipine (NORVASC) tablet 10 mg (has no administration in time range)  ?doxazosin (CARDURA) tablet 8 mg (has no administration in time  range)  ?metoprolol tartrate (LOPRESSOR) tablet 100 mg (has no administration in time range)  ?simvastatin (ZOCOR) tablet 10 mg (has no administration in time range)  ?spironolactone (ALDACTONE) tablet 25 mg (has no administration in time range)  ?famotidine (PEPCID) tablet 20 mg (has no administration in time range)  ?warfarin (COUMADIN) tablet 6 mg (has no administration in time range)  ?doxycycline (VIBRA-TABS) tablet 100 mg (has no administration in time range)  ?insulin aspart (novoLOG) injection 0-20 Units (has no administration in time range)  ?sodium chloride flush (NS) 0.9 % injection 3 mL (has no administration in time range)  ?sodium chloride flush (NS) 0.9 % injection 3 mL (has no administration in time range)  ?0.9 %  sodium chloride infusion (has no administration in time range)  ?acetaminophen (TYLENOL) tablet 650 mg (has no administration in time range)  ?  Or  ?acetaminophen (TYLENOL) suppository 650 mg (has no administration in time range)  ?ondansetron (ZOFRAN) tablet 4 mg (has no administration in time range)  ?  Or  ?ondansetron (ZOFRAN) injection 4 mg (has no  administration in time range)  ?sodium chloride 0.9 % bolus 500 mL (0 mLs Intravenous Stopped 04/28/21 1251)  ?morphine (PF) 4 MG/ML injection 4 mg (4 mg Intravenous Given 04/28/21 1149)  ?ondansetron (ZOFRAN) injection 4 mg (4 mg Intravenous Given 04/28/21 1149)  ? ? ? ?IMPRESSION / MDM / ASSESSMENT AND PLAN / ED COURSE  ?I reviewed the triage vital signs and the nursing notes. ?             ?               ? ?Differential diagnosis includes, but is not limited to, worsening wound infection, diabetes, peripheral artery disease, peripheral venous disease, osteomyelitis ? ?Concerns since patient's been on antibiotics and the wound still is worsening and not improving.  Feel that due to the diabetes along with his wound that he will need to be admitted. ? ?Lab work at this time is actually been reassuring, his CBC has a normal WBC and appears to be in normal trend for his anemia.  His metabolic panel has elevated glucose of 395 this is more elevated than his last visit.  His BMP is 67.5 which rules out CHF.  Lactic acid is still pending. ? ?X-ray of the right lower leg independently reviewed by me does not show a fracture or osteomyelitis.  Confirmed by radiology stating soft tissue to the superficial infection ? ?Patient's lactic is normal today.  Consult to hospitalist for admission ? ?Spoke with Dr.Agbata and she will be admitting the patient as he has failed outpatient antibiotics.  He is in stable condition. ? ? ? ?  ? ? ?FINAL CLINICAL IMPRESSION(S) / ED DIAGNOSES  ? ?Final diagnoses:  ?Wound infection  ?Hyperglycemia  ? ? ? ?Rx / DC Orders  ? ?ED Discharge Orders   ? ? None  ? ?  ? ? ? ?Note:  This document was prepared using Dragon voice recognition software and may include unintentional dictation errors. ? ?  ?Versie Starks, PA-C ?04/28/21 1313 ? ?  ?Merlyn Lot, MD ?04/28/21 1338 ? ?

## 2021-04-28 NOTE — Assessment & Plan Note (Signed)
Stable.  Continue Pepcid. 

## 2021-04-28 NOTE — ED Triage Notes (Signed)
First Nurse Note:  Arrives to ED for 'recheck' of leg.  States was seen last week for same.  Was told at that time that he could either be admitted or try the pills. States continues to see leg swelling. ? ?AAOx3.  Skin warm and dry. NAD.  No SOB/ DOE ?

## 2021-04-28 NOTE — ED Triage Notes (Signed)
Pt comes with c/o right leg pain and swelling. Pt states this started over month ago. Pt was seen here recently and prescribed lasix pill but it has not helped.  ? ?Pt states he feels like it made him go less. Pt states swelling has continued. ?

## 2021-04-29 ENCOUNTER — Ambulatory Visit: Payer: Medicare (Managed Care) | Admitting: Internal Medicine

## 2021-04-29 ENCOUNTER — Encounter: Payer: Self-pay | Admitting: Internal Medicine

## 2021-04-29 DIAGNOSIS — I83012 Varicose veins of right lower extremity with ulcer of calf: Secondary | ICD-10-CM | POA: Diagnosis not present

## 2021-04-29 DIAGNOSIS — Z86718 Personal history of other venous thrombosis and embolism: Secondary | ICD-10-CM

## 2021-04-29 DIAGNOSIS — M7989 Other specified soft tissue disorders: Secondary | ICD-10-CM

## 2021-04-29 DIAGNOSIS — T148XXA Other injury of unspecified body region, initial encounter: Secondary | ICD-10-CM

## 2021-04-29 DIAGNOSIS — M79661 Pain in right lower leg: Secondary | ICD-10-CM

## 2021-04-29 DIAGNOSIS — L089 Local infection of the skin and subcutaneous tissue, unspecified: Secondary | ICD-10-CM

## 2021-04-29 DIAGNOSIS — K219 Gastro-esophageal reflux disease without esophagitis: Secondary | ICD-10-CM | POA: Diagnosis not present

## 2021-04-29 DIAGNOSIS — R739 Hyperglycemia, unspecified: Secondary | ICD-10-CM

## 2021-04-29 LAB — GLUCOSE, CAPILLARY
Glucose-Capillary: 201 mg/dL — ABNORMAL HIGH (ref 70–99)
Glucose-Capillary: 318 mg/dL — ABNORMAL HIGH (ref 70–99)

## 2021-04-29 LAB — BASIC METABOLIC PANEL
Anion gap: 6 (ref 5–15)
BUN: 12 mg/dL (ref 8–23)
CO2: 28 mmol/L (ref 22–32)
Calcium: 9.3 mg/dL (ref 8.9–10.3)
Chloride: 102 mmol/L (ref 98–111)
Creatinine, Ser: 0.84 mg/dL (ref 0.61–1.24)
GFR, Estimated: >60 mL/min (ref >60)
Glucose, Bld: 225 mg/dL — ABNORMAL HIGH (ref 70–99)
Potassium: 3.8 mmol/L (ref 3.5–5.1)
Sodium: 136 mmol/L (ref 135–145)

## 2021-04-29 LAB — CBC
HCT: 35.5 % — ABNORMAL LOW (ref 39.0–52.0)
Hemoglobin: 10.5 g/dL — ABNORMAL LOW (ref 13.0–17.0)
MCH: 22.9 pg — ABNORMAL LOW (ref 26.0–34.0)
MCHC: 29.6 g/dL — ABNORMAL LOW (ref 30.0–36.0)
MCV: 77.3 fL — ABNORMAL LOW (ref 80.0–100.0)
Platelets: 211 10*3/uL (ref 150–400)
RBC: 4.59 MIL/uL (ref 4.22–5.81)
RDW: 16.3 % — ABNORMAL HIGH (ref 11.5–15.5)
WBC: 7 10*3/uL (ref 4.0–10.5)
nRBC: 0 % (ref 0.0–0.2)

## 2021-04-29 LAB — PROTIME-INR
INR: 2 — ABNORMAL HIGH (ref 0.8–1.2)
Prothrombin Time: 22.8 seconds — ABNORMAL HIGH (ref 11.4–15.2)

## 2021-04-29 LAB — HIV ANTIBODY (ROUTINE TESTING W REFLEX): HIV Screen 4th Generation wRfx: NONREACTIVE

## 2021-04-29 LAB — HEMOGLOBIN A1C
Hgb A1c MFr Bld: 10.4 % — ABNORMAL HIGH (ref 4.8–5.6)
Mean Plasma Glucose: 251.78 mg/dL

## 2021-04-29 MED ORDER — DAKINS (1/4 STRENGTH) 0.125 % EX SOLN: CUTANEOUS | 0 refills | Status: AC

## 2021-04-29 MED ORDER — ENSURE MAX PROTEIN PO LIQD
11.0000 [oz_av] | Freq: Every day | ORAL | Status: DC
  Filled 2021-04-29: qty 330

## 2021-04-29 MED ORDER — DOXYCYCLINE HYCLATE 100 MG PO TABS
100.0000 mg | ORAL_TABLET | Freq: Two times a day (BID) | ORAL | 0 refills | Status: AC
Start: 2021-04-29 — End: 2021-05-05

## 2021-04-29 MED ORDER — JUVEN PO PACK
1.0000 | PACK | Freq: Two times a day (BID) | ORAL | Status: DC
  Administered 2021-04-29: 1 via ORAL

## 2021-04-29 MED ORDER — LIVING WELL WITH DIABETES BOOK
Freq: Once | Status: AC
  Filled 2021-04-29: qty 1

## 2021-04-29 MED ORDER — ADULT MULTIVITAMIN W/MINERALS CH
1.0000 | ORAL_TABLET | Freq: Every day | ORAL | Status: DC
  Administered 2021-04-29: 1 via ORAL
  Filled 2021-04-29: qty 1

## 2021-04-29 MED ORDER — ZINC SULFATE 220 (50 ZN) MG PO CAPS
220.0000 mg | ORAL_CAPSULE | Freq: Every day | ORAL | Status: DC
  Administered 2021-04-29: 220 mg via ORAL
  Filled 2021-04-29: qty 1

## 2021-04-29 MED ORDER — ASPIRIN EC 81 MG PO TBEC
81.0000 mg | DELAYED_RELEASE_TABLET | Freq: Every day | ORAL | 0 refills | Status: AC
Start: 2021-04-29 — End: ?

## 2021-04-29 MED ORDER — ASCORBIC ACID 500 MG PO TABS
500.0000 mg | ORAL_TABLET | Freq: Two times a day (BID) | ORAL | Status: DC
Start: 2021-04-29 — End: 2021-04-29
  Administered 2021-04-29: 500 mg via ORAL
  Filled 2021-04-29: qty 1

## 2021-04-29 MED ORDER — ZINC SULFATE 220 (50 ZN) MG PO CAPS: 220.0000 mg | ORAL_CAPSULE | Freq: Every day | ORAL | 0 refills | Status: AC

## 2021-04-29 NOTE — Progress Notes (Signed)
Discharge order received, IV taken out and pt meds returned to him from pharmacy.  AVS packet given to patient and discussed what medications have changed.  Pt taken [place] medical mall entrance where daughter picked him up to go home. ?

## 2021-04-29 NOTE — TOC Progression Note (Addendum)
Transition of Care (TOC) - Progression Note  ? ? ?Patient Details  ?Name: Shane Bean ?MRN: [id number] ?Date of Birth: 1956-01-04 ? ?Transition of Care (TOC) CM/SW Contact  ?Conception Oms, RN ?Phone Number: ?04/29/2021, 9:31 AM ? ?Clinical Narrative:    ?Patient from home, ?Pt is followed by the outpatient wound care center, most recently on 4/21.  They have ordered Dakins solution to be applied Q day, with ace wrap to provide light compression ?  ?TOC to monitor for needs and assist ? ? ?  ?  ? ?Expected Discharge Plan and Services ?  ?   ?  ?                ?  ?  ?  ?  ?  ?  ?  ?  ?  ?  ? ? ?Social Determinants of Health (SDOH) Interventions ?  ? ?Readmission Risk Interventions ?   ? View : No data to display.  ?  ?  ?  ? ? ?

## 2021-04-29 NOTE — Discharge Instructions (Signed)
Plate Method for Diabetes  ? ?Foods with carbohydrates make your blood glucose level go up. The plate method is a simple way to meal plan and control the amount of carbohydrate you eat. ?    ?    ?Use the following guidance to build a healthy plate to control carbohydrates. Divide a 9-inch plate into 3 sections, and consider your beverage the 4th section of your meal: ?Food Group Examples of Foods/Beverages for This Section of your Meal  ?Section 1: Non-starchy vegetables ?Fill ? of your plate to include non-starchy vegetables Asparagus, broccoli, brussels sprouts, cabbage, carrots, cauliflower, celery, cucumber, green beans, mushrooms, peppers, salad greens, tomatoes, or zucchini.  ?Section 2: Protein foods ?Fill ? of your plate to include a lean protein Lean meat, poultry, fish, seafood, cheese, eggs, lean deli meat, tofu, beans, lentils, nuts or nut butters.  ?Section 3: Carbohydrate foods ?Fill ? of your plate to include carbohydrate foods Whole grains, whole wheat bread, brown rice, whole grain pasta, polenta, corn tortillas, fruit, or starchy vegetables (potatoes, green peas, corn, beans, acorn squash, and butternut squash). One cup of milk also counts as a food that contains carbohydrate.  ?Section 4: Beverage ?Choose water or a low-calorie drink for your beverage. Unsweetened tea, coffee, or flavored/sparkling water without added sugar.  ?Image reprinted with permission from The American Diabetes Association.  Copyright 2022 by the American Diabetes Association. ? ? ?Copyright 2022 ? Academy of Nutrition and Dietetics. All rights reserved   ? ?Carbohydrate Counting For People With Diabetes ? ?Foods with carbohydrates make your blood glucose level go up. Learning how to count carbohydrates can help you control your blood glucose levels. First, identify the foods you eat that contain carbohydrates. Then, using the Foods with Carbohydrates chart, determine about how much carbohydrates are in your meals and  snacks. Make sure you are eating foods with fiber, protein, and healthy fat along with your carbohydrate foods. ?Foods with Carbohydrates ?The following table shows carbohydrate foods that have about 15 grams of carbohydrate each. Using measuring cups, spoons, or a food scale when you first begin learning about carbohydrate counting can help you learn about the portion sizes you typically eat. ?The following foods have 15 grams carbohydrate each:  ?Grains ?1 slice bread (1 ounce)  ?1 small tortilla (6-inch size)  ?? large bagel (1 ounce)  ?1/3 cup pasta or rice (cooked)  ?? hamburger or hot dog bun (? ounce)  ?? cup cooked cereal  ?? to ? cup ready-to-eat cereal  ?2 taco shells (5-inch size) Fruit ?1 small fresh fruit (? to 1 cup)  ?? medium banana  ?17 small grapes (3 ounces)  ?1 cup melon or berries  ?? cup canned or frozen fruit  ?2 tablespoons dried fruit (blueberries, cherries, cranberries, raisins)  ?? cup unsweetened fruit juice  ?Starchy Vegetables ?? cup cooked beans, peas, corn, potatoes/sweet potatoes  ?? large baked potato (3 ounces)  ?1 cup acorn or butternut squash  Snack Foods ?3 to 6 crackers  ?8 potato chips or 13 tortilla chips (? ounce to 1 ounce)  ?3 cups popped popcorn  ?Dairy ?3/4 cup (6 ounces) nonfat plain yogurt, or yogurt with sugar-free sweetener  ?1 cup milk  ?1 cup plain rice, soy, coconut or flavored almond milk Sweets and Desserts ?? cup ice cream or frozen yogurt  ?1 tablespoon jam, jelly, pancake syrup, table sugar, or honey  ?2 tablespoons light pancake syrup  ?1 inch square of frosted cake or 2 inch square of  unfrosted cake  ?2 small cookies (2/3 ounce each) or ? large cookie  ?Sometimes you?ll have to estimate carbohydrate amounts if you don?t know the exact recipe. One cup of mixed foods like soups can have 1 to 2 carbohydrate servings, while some casseroles might have 2 or more servings of carbohydrate. ?Foods that have less than 20 calories in each serving can be counted as  ?free? foods. Count 1 cup raw vegetables, or ? cup cooked non-starchy vegetables as ?free? foods. If you eat 3 or more servings at one meal, then count them as 1 carbohydrate serving.  ?Foods without Carbohydrates  ?Not all foods contain carbohydrates. Meat, some dairy, fats, non-starchy vegetables, and many beverages don?t contain carbohydrate. So when you count carbohydrates, you can generally exclude chicken, pork, beef, fish, seafood, eggs, tofu, cheese, butter, sour cream, avocado, nuts, seeds, olives, mayonnaise, water, black coffee, unsweetened tea, and zero-calorie drinks. Vegetables with no or low carbohydrate include green beans, cauliflower, tomatoes, and onions. ?How much carbohydrate should I eat at each meal?  ?Carbohydrate counting can help you plan your meals and manage your weight. Following are some starting points for carbohydrate intake at each meal. Work with your registered dietitian nutritionist to find the best range that works for your blood glucose and weight.  ? To Lose Weight To Maintain Weight  ?Women 2 - 3 carb servings 3 - 4 carb servings  ?Men 3 - 4 carb servings 4 - 5 carb servings  ?Checking your blood glucose after meals will help you know if you need to adjust the timing, type, or number of carbohydrate servings in your meal plan. Achieve and keep a healthy body weight by balancing your food intake and physical activity. ? ?Tips ?How should I plan my meals?  ?Plan for half the food on your plate to include non-starchy vegetables, like salad greens, broccoli, or carrots. Try to eat 3 to 5 servings of non-starchy vegetables every day. Have a protein food at each meal. Protein foods include chicken, fish, meat, eggs, or beans (note that beans contain carbohydrate). These two food groups (non-starchy vegetables and proteins) are low in carbohydrate. If you fill up your plate with these foods, you will eat less carbohydrate but still fill up your stomach. Try to limit your carbohydrate  portion to ? of the plate.  ?What fats are healthiest to eat?  ?Diabetes increases risk for heart disease. To help protect your heart, eat more healthy fats, such as olive oil, nuts, and avocado. Eat less saturated fats like butter, cream, and high-fat meats, like bacon and sausage. Avoid trans fats, which are in all foods that list ?partially hydrogenated oil? as an ingredient. ?What should I drink?  ?Choose drinks that are not sweetened with sugar. The healthiest choices are water, carbonated or seltzer waters, and tea and coffee without added sugars.  ?Sweet drinks will make your blood glucose go up very quickly. One serving of soda or energy drink is ? cup. It is best to drink these beverages only if your blood glucose is low.  ?Artificially sweetened, or diet drinks, typically do not increase your blood glucose if they have zero calories in them. Read labels of beverages, as some diet drinks do have carbohydrate and will raise your blood glucose. ?Label Reading Tips ?Read Nutrition Facts labels to find out how many grams of carbohydrate are in a food you want to eat. Don?t forget: sometimes serving sizes on the label aren?t the same as how much food  you are going to eat, so you may need to calculate how much carbohydrate is in the food you are serving yourself.  ? ?Carbohydrate Counting for People with Diabetes Sample 1-Day Menu  ?Breakfast ? cup yogurt, low fat, low sugar (1 carbohydrate serving)  ?? cup cereal, ready-to-eat, unsweetened (1 carbohydrate serving)  ?1 cup strawberries (1 carbohydrate serving)  ?? cup almonds (? carbohydrate serving)  ?Lunch 1, 5 ounce can chunk light tuna  ?2 ounces cheese, low fat cheddar  ?6 whole wheat crackers (1 carbohydrate serving)  ?1 small apple (1? carbohydrate servings)  ?? cup carrots (? carbohydrate serving)  ?? cup snap peas  ?1 cup 1% milk (1 carbohydrate serving)   ?Evening Meal Stir fry made with: 3 ounces chicken  ?1 cup brown rice (3 carbohydrate servings)  ??  cup broccoli (? carbohydrate serving)  ?? cup green beans  ?? cup onions  ?1 tablespoon olive oil  ?2 tablespoons teriyaki sauce (? carbohydrate serving)  ?Evening Snack 1 extra small banana (1 carbohydrat

## 2021-04-29 NOTE — Discharge Summary (Signed)
?Physician Discharge Summary ?  ?Patient: Shane Bean MRN: [id number] DOB: May 14, 1955  ?Admit date:     04/28/2021  ?Discharge date: 04/29/21  ?Discharge Physician: Emeterio Reeve  ? ?PCP: Janyth Pupa, NP  ? ?Recommendations at discharge:  ? ? Follow with wound care as scheduled, see them or call sooner if skin appears to be worsening  ?Need to see PCP in 1-2 weeks to address Glc control / A1C >10.  ? ?Discharge Diagnoses: ?Principal Problem: ?  Venous stasis ulcers ([place]) ?Active Problems: ?  Type 2 diabetes mellitus with complication ([place]) ?  History of deep vein thrombosis (DVT) of lower extremity ?  GERD (gastroesophageal reflux disease) ?  Obesity, Class III, BMI 40-49.9 (morbid obesity) ([place]) ? ?Resolved Problems: ?  * No resolved hospital problems. * ? ? ? ?Hospital Course: ?Failed outpatient treatment of purulent venous stasis ulceration, drainage had improved but he reported his pain worsened so presented to ED again here 04/28/21. XR was non-concerning other than soft tissue edema. No sepsis. Pt reports improvement in pain this AM 04/29/21 and no adverse effect of doxycycline. Discussed Glc not at goal and he needs to address this, continue doxycycline, return to ED or consult w/ wound care if not improving.  ? ?Assessment and Plan: ?* Venous stasis ulcers ([place]) ?Patient presents to the ER for evaluation of increased pain and drainage from a nonhealing venous stasis ulcer on his right lower extremity. ?Started on doxycycline 100 mg twice daily ?Wound care consult: Apply Dakin's moistened gauze to right leg wounds Q day, then cover with ABD pads.  Wrap with kerlex in a spiral fashion, beginning just behind toes, to below knee, then in the same manner with ace wrap ?Morning of discharge, he reports desire to go home, improvement in pain though not resolution of pain. No sepsis.  ? ?Type 2 diabetes mellitus with complication ([place]) ?Non compliant  ?A1C >10 ?Follow w/ PCP ?Educated on  importance of Glc control to avoid worsening / recurrent infection and other complications ? ?History of deep vein thrombosis (DVT) of lower extremity ?Patient has a history of DVT and is on chronic anticoagulation therapy with Coumadin, INR is therapeutic and pt reports edema is waxing and waning, no SOB, edema more c/w inflammatory response associated w/ skin infection  ? ?GERD (gastroesophageal reflux disease) ?Stable ?Continue Pepcid ? ?Obesity, Class III, BMI 40-49.9 (morbid obesity) ([place]) ?BMI 40.18 kg/m2 ?Complicates overall prognosis and care ?Lifestyle modification and exercise has been discussed with patient ? ? ? ? ? ? ?Consultants: wound care  ?Procedures performed: none  ?Disposition: Home ?Diet recommendation:  ?Cardiac and Carb modified diet ?DISCHARGE MEDICATION: ?Allergies as of 04/29/2021   ? ?   Reactions  ? Chlorthalidone Swelling  ? Testicular/Scrotum edema  ? Hydralazine Other (See Comments)  ? GI Upset (intolerance)  ? Lisinopril Swelling  ? Facial Edema (intolerance)  ? ?  ? ? ?Allergies as of 04/29/2021   ? ?   Reactions  ? Chlorthalidone Swelling  ? Testicular/Scrotum edema  ? Hydralazine Other (See Comments)  ? GI Upset (intolerance)  ? Lisinopril Swelling  ? Facial Edema (intolerance)  ? ?  ? ?  ?Medication List  ?  ? ?STOP taking these medications   ? ?amoxicillin-clavulanate 875-125 MG tablet ?Commonly known as: AUGMENTIN ?  ?HYDROcodone-acetaminophen 5-325 MG tablet ?Commonly known as: Norco ?  ?ranitidine 150 MG capsule ?Commonly known as: ZANTAC ?  ?sulfamethoxazole-trimethoprim 800-160 MG tablet ?Commonly known as: BACTRIM DS ?  ?  traMADol 50 MG tablet ?Commonly known as: Ultram ?  ? ?  ? ?TAKE these medications   ? ?allopurinol 100 MG tablet ?Commonly known as: ZYLOPRIM ?Take 100 mg by mouth daily at 3 pm. ?  ?amLODipine 10 MG tablet ?Commonly known as: NORVASC ?Take 10 mg by mouth at bedtime. ?  ?aspirin EC 81 MG tablet ?Take 1 tablet (81 mg total) by mouth daily. ?What changed:   ?medication strength ?how much to take ?  ?doxazosin 8 MG tablet ?Commonly known as: CARDURA ?Take 8 mg by mouth at bedtime. ?  ?doxycycline 100 MG tablet ?Commonly known as: VIBRA-TABS ?Take 1 tablet (100 mg total) by mouth every 12 (twelve) hours for 6 days. ?  ?furosemide 20 MG tablet ?Commonly known as: Lasix ?Take 1 tablet (20 mg total) by mouth 2 (two) times daily for 5 days. ?  ?glipiZIDE 5 MG tablet ?Commonly known as: GLUCOTROL ?Take 10 mg by mouth 2 (two) times daily. ?  ?metoprolol tartrate 100 MG tablet ?Commonly known as: LOPRESSOR ?Take 200 mg by mouth daily. ?  ?simvastatin 10 MG tablet ?Commonly known as: ZOCOR ?Take 10 mg by mouth every evening. ?  ?sodium hypochlorite 0.125 % Soln ?Commonly known as: DAKIN'S 1/4 STRENGTH ?Apply Dakin's moistened gauze to right leg wounds Q day, then cover with ABD pads.  Wrap with kerlex in a spiral fashion, beginning just behind toes, to below knee, then in the same manner with ace wrap ?  ?spironolactone 25 MG tablet ?Commonly known as: ALDACTONE ?Take 25 mg by mouth at bedtime. ?  ?warfarin 6 MG tablet ?Commonly known as: COUMADIN ?Take 6 mg by mouth every evening. ?  ?zinc sulfate 220 (50 Zn) MG capsule ?Take 1 capsule (220 mg total) by mouth daily for 14 days. ?  ? ?  ? ?  ?  ? ? ?  ?Discharge Care Instructions  ?(From admission, onward)  ?  ? ? ?  ? ?  Start     Ordered  ? 04/29/21 0000  Discharge wound care:       ?Comments: Apply Dakin's moistened gauze to right leg wounds daily, then cover with ABD pads.  Wrap with kerlex in a spiral fashion, beginning just behind toes, to below knee, then in the same manner with ace wrap  ? 04/29/21 1342  ? ?  ?  ? ?  ? ? ? ? ? ?  ?Discharge Care Instructions  ?(From admission, onward)  ?  ? ? ?  ? ?  Start     Ordered  ? 04/29/21 0000  Discharge wound care:       ?Comments: Apply Dakin's moistened gauze to right leg wounds daily, then cover with ABD pads.  Wrap with kerlex in a spiral fashion, beginning just behind  toes, to below knee, then in the same manner with ace wrap  ? 04/29/21 1342  ? ?  ?  ? ?  ? ? ? ? ?Discharge Exam: ?Filed Weights  ? 04/28/21 2310  ?Weight: 125.7 kg  ? ?Constitutional:  ?VSS, see nurse notes ?General Appearance: alert, well-developed, well-nourished, NAD ?Neck: ?No masses, trachea midline ?Respiratory: ?Normal respiratory effort ?Breath sounds normal, no wheeze/rhonchi/rales ?Cardiovascular: ?S1/S2 normal, no murmur/rub/gallop auscultated ?Pedal pulse II/IV bilaterally DP and PT ?Trace bilateral lower extremity edema ?Gastrointestinal: ?Nontender, no masses ?Musculoskeletal:  ?Gait normal ?No clubbing/cyanosis of digits ?Neurological: ?No cranial nerve deficit on limited exam ?Psychiatric: ?Normal judgment/insight ?Normal mood and affect ? ? ? ?Condition at discharge: good ? ?  The results of significant diagnostics from this hospitalization (including imaging, microbiology, ancillary and laboratory) are listed below for reference.  ? ?Imaging Studies: ?DG Tibia/Fibula Right ? ?Result Date: 04/28/2021 ?CLINICAL DATA:  Wound infection. Concerns of osteomyelitis. Right leg pain and swelling. EXAM: RIGHT TIBIA AND FIBULA - 2 VIEW COMPARISON:  None. FINDINGS: Normal bone mineralization. Mild medial compartment of the knee joint space narrowing. The ankle mortise is symmetric and intact. Mild-to-moderate chronic enthesopathic change at the quadriceps and patellar tendon insertions on the patella. No knee joint effusion. Mild chronic enthesopathic change at the patellar tendon insertion on the tibial tubercle. Mild dorsal talonavicular and navicular-cuneiform degenerative osteophytes on lateral view. No acute fracture. No cortical erosion to indicate radiographic evidence of acute osteomyelitis. Mild irregularity of the distal lateral calf soft tissues, a probable superficial wound. No subcutaneous air is seen. Moderate vascular calcifications. IMPRESSION:: IMPRESSION: 1. No acute fracture. 2. Superficial  wound within the distal lateral calf soft tissues. No radiographic evidence of acute osteomyelitis. Electronically Signed   By: Yvonne Kendall M.D.   On: 04/28/2021 12:06   ? ?Microbiology: ?Results for orders placed or

## 2021-04-29 NOTE — Consult Note (Signed)
ANTICOAGULATION CONSULT NOTE - Initial Consult ? ?Pharmacy Consult for warfarin ?Indication: Hx of DVT ? ?Allergies  ?Allergen Reactions  ? Chlorthalidone Swelling  ?  Testicular/Scrotum edema  ? Hydralazine Other (See Comments)  ?  GI Upset (intolerance)  ? Lisinopril Swelling  ?  Facial Edema (intolerance)  ? ? ?Patient Measurements: ?Height: '5\' 10"'$  (177.8 cm) ?Weight: 125.7 kg (277 lb 1.9 oz) ?IBW/kg (Calculated) : 73 ? ? ?Vital Signs: ?Temp: 98.4 ?F (36.9 ?C) (04/27 0840) ?BP: 128/88 (04/27 0840) ?Pulse Rate: 94 (04/27 0840) ? ?Labs: ?Recent Labs  ?  04/28/21 ?1003 04/28/21 ?1700 04/29/21 ?1245  ?HGB 10.4*  --  10.5*  ?HCT 34.9*  --  35.5*  ?PLT 218  --  211  ?LABPROT  --  22.8* 22.8*  ?INR  --  2.0* 2.0*  ?CREATININE 1.12  --  0.84  ? ? ? ?Estimated Creatinine Clearance: 116.7 mL/min (by C-G formula based on SCr of 0.84 mg/dL). ? ? ?Medical History: ?Past Medical History:  ?Diagnosis Date  ? Arthritis   ? Ascending aortic dissection ([facility])   ? Diabetes mellitus without complication ([place])   ? DVT (deep venous thrombosis) ([place])   ? legs and lungs   ? Hyperlipemia   ? Hypertension   ? Kidney stones   ? ? ?Medications:  ?PTA Warfarin regimen: 6 mg daily (TWD 42 mg) ?Last dose 4/26  ? ?DDIs: ?Augmentin 875-125 mg BID -- PTA med started 04/12/21 ?Doxycycline 100 mg Q12H - started 04/28/21  ? ?Assessment: ?66 y.o. male with medical history significant for DVT on chronic anticoagulation with Coumadin regimen as above, [facility] for wound check. Pharmacy has been consulted to manage warfarin regimen while patient admitted. ?INR 2.0 (therapeutic) on admission ? ? ?Date INR Warfarin Dose  ?4/25 -- 6 mg ?4/26 2.0 6 mg ?4/27 2.0 '6mg'$  ? ?Goal of Therapy:  ?INR 2-3 ?Monitor platelets by anticoagulation protocol: Yes ?  ?Plan:  ?INR 2.0 (therapeutic) on admission ?Continue with home regimen of 6 mg tonight ?INR daily  ? ?Thank you for allowing pharmacy to be a part of this pleasant patient?s care. ? ?Darrick Penna, PharmD, MS PGPM ?Clinical Pharmacist ?04/29/2021 ?11:15 AM ? ? ? ?

## 2021-04-29 NOTE — Progress Notes (Addendum)
Initial Nutrition Assessment ? ?DOCUMENTATION CODES:  ? ?Obesity unspecified ? ?INTERVENTION:  ? ?-MVI with minerals daily ?-1 packet Juven BID, each packet provides 95 calories, 2.5 grams of protein (collagen), and 9.8 grams of carbohydrate (3 grams sugar); also contains 7 grams of L-arginine and L-glutamine, 300 mg vitamin C, 15 mg vitamin E, 1.2 mcg vitamin B-12, 9.5 mg zinc, 200 mg calcium, and 1.5 g  Calcium Beta-hydroxy-Beta-methylbutyrate to support wound healing  ?-500 mg vitamin C BID ?-220 mg zinc sulfate daily x 14 days ?-Ensure Max po daily, each supplement provides 150 kcal and 30 grams of protein  ?-Reviewed carb modified diet; provided "Carbohydrate Counting for People for Diabetes" and "Plate Method" handouts from [facility]'s Nutrition Care Manual ? ?NUTRITION DIAGNOSIS:  ? ?Increased nutrient needs related to wound healing as evidenced by estimated needs. ? ?GOAL:  ? ?Patient will meet greater than or equal to 90% of their needs ? ?MONITOR:  ? ?PO intake, Supplement acceptance, Labs, Weight trends, Skin, I & O's ? ?REASON FOR ASSESSMENT:  ? ?Consult ?Assessment of nutrition requirement/status, Wound healing ? ?ASSESSMENT:  ? ?Shane Bean is a 66 y.o. 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 ? ?Pt admitted with non healing venous stasis ulcer on rt lower extremity.  ? ?Reviewed I/O's: +400 ml x 24 hours ? ?UOP: 100 ml x 24 hours  ? ?Spoke with pt at bedside, who reports feeling better today. He reports that her first noticed a small spot on his leg about a month ago and progressively got worse. Pt shares he went to the wound center for it to be evaluated about 2 weeks ago, where he was sent to ED for evaluation; he was given to choice to be  admitted or take a 5 day course of diuretics. Pt choose to take the diuretics, but shared that he did not find this treatment effective and returned to the ED for admission. Pt shares that he had a similar spot on his left leg last year, which was treated by vascular surgery and wound center with dressing and antibiotics.  ? ?Pt shares he has a good appetite, but shares "I don't eat right at home". Pt does not follow a consistent meal schedule and snacks on whatever he wants at home, including fried chicken, fried fish, and ice cream sandwiches.  ? ?Pt denies any weight loss. Reviewed wt hx; pt has expeirnced a 1% wt loss over the past month, which is not significant for time frame.  ? ?Discussed DM regimen at home. Pt denies any difficulty getting DM tersting supplies or medications. He noticed his CBGS have been increasing recently, but reports they are usually elevated at baseline. Had long discussion about importance of good glycemic control to promote healing and provided specific examples on how to improve blood sugar control, including self-management compliance, high protein food choices, and examples of low calorie beverages. Also reviewed importance of foot care. Teachback method used.  ? ?Lab Results  ?Component Value Date  ? HGBA1C 10.4 (H) 04/28/2021  ? PTA DM medications are none.  ? ?Labs reviewed: CBGS: 628-315 (inpatient orders for glycemic control are 0-20 units insulin aspart TID with meals).   ? ?NUTRITION - FOCUSED PHYSICAL EXAM: ? ?Flowsheet Row Most Recent Value  ?Orbital Region  No depletion  ?Upper Arm Region No depletion  ?Thoracic and Lumbar Region No depletion  ?Buccal Region No depletion  ?Temple Region No depletion  ?Clavicle Bone Region No depletion  ?Clavicle and Acromion Bone Region No depletion  ?Scapular Bone Region No depletion  ?Dorsal Hand No depletion  ?Patellar Region No depletion  ?Anterior Thigh Region No depletion  ?Posterior Calf Region No depletion  ?Edema (RD Assessment)  None  ?Hair Reviewed  ?Eyes Reviewed  ?Mouth Reviewed  ?Skin Reviewed  ?Nails Reviewed  ? ?  ? ? ?Diet Order:   ?Diet Order   ? ?       ?  Diet - low sodium heart healthy       ?  ?  Diet heart healthy/carb modified Room service appropriate? Yes; Fluid consistency: Thin  Diet effective now       ?  ? ?  ?  ? ?  ? ? ?EDUCATION NEEDS:  ? ?Education needs have been addressed ? ?Skin:  Skin Assessment: Skin Integrity Issues: ?Skin Integrity Issues:: Other (Comment) ?Other: venous stasis wound to rt posterior thigh ? ?Last BM:  04/28/21 ? ?Height:  ? ?Ht Readings from Last 1 Encounters:  ?04/28/21 '5\' 10"'$  (1.778 m)  ? ? ?Weight:  ? ?Wt Readings from Last 1 Encounters:  ?04/28/21 125.7 kg  ? ? ?Ideal Body Weight:  52.7 kg ? ?BMI:  Body mass index is 39.76 kg/m?. ? ?Estimated Nutritional Needs:  ? ?Kcal:  1850-2050 ? ?Protein:  105-120 grams ? ?Fluid:  > 1.8 L ? ? ? ?Loistine Chance, RD, LDN, CDCES ?Registered Dietitian II ?Certified Diabetes Care and Education Specialist ?Please refer to [facility] for RD and/or RD on-call/weekend/after hours pager  ?

## 2021-05-04 ENCOUNTER — Encounter: Payer: Medicare (Managed Care) | Attending: Physician Assistant | Admitting: Physician Assistant

## 2021-05-04 DIAGNOSIS — E1142 Type 2 diabetes mellitus with diabetic polyneuropathy: Secondary | ICD-10-CM | POA: Diagnosis not present

## 2021-05-04 DIAGNOSIS — I10 Essential (primary) hypertension: Secondary | ICD-10-CM | POA: Diagnosis not present

## 2021-05-04 DIAGNOSIS — L97822 Non-pressure chronic ulcer of other part of left lower leg with fat layer exposed: Secondary | ICD-10-CM | POA: Insufficient documentation

## 2021-05-04 DIAGNOSIS — E11621 Type 2 diabetes mellitus with foot ulcer: Secondary | ICD-10-CM | POA: Diagnosis not present

## 2021-05-04 DIAGNOSIS — I872 Venous insufficiency (chronic) (peripheral): Secondary | ICD-10-CM | POA: Insufficient documentation

## 2021-05-04 DIAGNOSIS — L97812 Non-pressure chronic ulcer of other part of right lower leg with fat layer exposed: Secondary | ICD-10-CM | POA: Insufficient documentation

## 2021-05-04 DIAGNOSIS — E11622 Type 2 diabetes mellitus with other skin ulcer: Secondary | ICD-10-CM | POA: Diagnosis not present

## 2021-05-04 NOTE — Progress Notes (Signed)
MATHEO, RATHBONE ([id number]) ?Visit Report for 05/04/2021 ?Arrival Information Details ?Patient Name: Shane Bean ?Date of Service: 05/04/2021 8:30 AM ?Medical Record Number: [id number] ?Patient Account Number: [id number] ?Date of Birth/Sex: 04-16-1955 (66 y.o. M) ?Treating RN: Donnamarie Poag ?Primary Care Lesleyanne Politte: Reeves Dam Other Clinician: ?Referring Andres Escandon: Reeves Dam ?Treating Tennyson Wacha/Extender: Jeri Cos ?Weeks in Treatment: 3 ?Visit Information History Since Last Visit ?Added or deleted any medications: No ?Patient Arrived: Ambulatory ?Any new allergies or adverse reactions: No ?Arrival Time: 08:36 ?Hospitalized since last visit: Yes ?Accompanied By: self ?Has Dressing in Place as Prescribed: Yes ?Transfer Assistance: None ?Pain Present Now: Yes ?Patient Identification Verified: Yes ?Secondary Verification Process Completed: Yes ?Patient Requires Transmission-Based No ?Precautions: ?Patient Has Alerts: Yes ?Patient Alerts: Patient on Blood ?Thinner ?ABI R [place] TBI .90 7/22 ?ABI L 1.46 TBI .76 ?7/22 ?Electronic Signature(s) ?Signed: 05/04/2021 4:17:52 PM By: Donnamarie Poag ?Entered ByDonnamarie Poag on 05/04/2021 08:38:35 ?CHRISTOFFER, CURRIER ([id number]) ?-------------------------------------------------------------------------------- ?Compression Therapy Details ?Patient Name: Shane Bean ?Date of Service: 05/04/2021 8:30 AM ?Medical Record Number: [id number] ?Patient Account Number: [id number] ?Date of Birth/Sex: [birth year] (66 y.o. M) ?Treating RN: Donnamarie Poag ?Primary Care Kalicia Dufresne: Reeves Dam Other Clinician: ?Referring Nickalous Stingley: Reeves Dam ?Treating Chanse Kagel/Extender: Jeri Cos ?Weeks in Treatment: 3 ?Compression Therapy Performed for Wound Assessment: Wound #3 Right,Lateral,Circumferential Lower Leg ?Performed By: Clinician Donnamarie Poag, RN ?Compression Type: Three Layer ?Post Procedure Diagnosis ?Same as Pre-procedure ?Electronic Signature(s) ?Signed: 05/04/2021 4:17:52 PM By: Donnamarie Poag ?Entered ByDonnamarie Poag on 05/04/2021 09:00:10 ?ANDRIC, KERCE ([id number]) ?-------------------------------------------------------------------------------- ?Compression Therapy Details ?Patient Name: Shane Bean ?Date of Service: 05/04/2021 8:30 AM ?Medical Record Number: [id number] ?Patient Account Number: [id number] ?Date of Birth/Sex: March 01, 1955 (66 y.o. M) ?Treating RN: Donnamarie Poag ?Primary Care Doaa Kendzierski: Re[facility] Other Clinician: ?Referring Caitlyne Ingham: Re[facility] ?Treating Rilen Shukla/Extender: Jeri Cos ?Weeks in Treatment: 3 ?Compression Therapy Performed for Wound Assessment: Wound #2 Right,Anterior Lower Leg ?Performed By: Clinician Donnamarie Poag, RN ?Compression Type: Three Layer ?Post Procedure Diagnosis ?Same as Pre-procedure ?Electronic Signature(s) ?Signed: 05/04/2021 4:17:52 PM By: Donnamarie Poag ?Entered ByDonnamarie Poag on 05/04/2021 09:00:11 ?SAKARI, ALKHATIB ([id number]) ?-------------------------------------------------------------------------------- ?Encounter Discharge Information Details ?Patient Name: Shane Bean ?Date of Service: 05/04/2021 8:30 AM ?Medical Record Number: [id number] ?Patient Account Number: [id number] ?Date of Birth/Sex: [birth year] (66 y.o. M) ?Treating RN: Donnamarie Poag ?Primary Care Calise Dunckel: Reeves Dam Other Clinician: ?Referring Erron Wengert: Reeves Dam ?Treating Mandi Mattioli/Extender: Jeri Cos ?Weeks in Treatment: 3 ?Encounter Discharge Information Items ?Discharge Condition: Stable ?Ambulatory Status: Ambulatory ?Discharge Destination: Home ?Transportation: Private Auto ?Accompanied By: self ?Schedule Follow-up Appointment: Yes ?Clinical Summary of Care: ?Electronic Signature(s) ?Signed: 05/04/2021 4:17:52 PM By: Donnamarie Poag ?Entered ByDonnamarie Poag on 05/04/2021 09:01:03 ?MADDOX, HLAVATY ([id number]) ?-------------------------------------------------------------------------------- ?Lower Extremity Assessment Details ?Patient Name: Shane Bean ?Date of Service: 05/04/2021 8:30 AM ?Medical Record Number: [id number] ?Patient Account Number: [id number] ?Date of Birth/Sex: 1955/02/01 (66 y.o. M) ?Treating RN: Donnamarie Poag ?Primary Care Briea Mcenery: [facility] Other Clinician: ?Referring Zymir Napoli: Re[facility] ?Treating Antonius Hartlage/Extender: Jeri Cos ?Weeks in Treatment: 3 ?Edema Assessment ?Assessed: [Left: No] [Right: Yes] ?Edema: [Left: Ye] [Right: s] ?Calf ?Left: Right: ?Point of Measurement: 35 cm From Medial Instep 47.5 cm ?Ankle ?Left: Right: ?Point of Measurement: 10 cm From Medial Instep 29.8 cm ?Knee To Floor ?Left: Right: ?From Medial Instep 43 cm ?Vascular Assessment ?Pulses: ?Dorsalis Pedis ?Palpable: [Right:Yes] ?Electronic Signature(s) ?Signed: 05/04/2021 4:17:52 PM By: Donnamarie Poag ?Entered ByDonnamarie Poag on 05/04/2021 08:48:07 ?ELIJIO, STAPLES ([id number]) ?-------------------------------------------------------------------------------- ?Multi Wound Chart Details ?  Patient Name: Shane Bean ?Date of Service: 05/04/2021 8:30 AM ?Medical Record Number: [id number] ?Patient Account Number: [id number] ?Date of Birth/Sex: 1955-11-27 (66 y.o. 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 ?Extremity Extremity ?Comorbid History: Hypertension, Type II Diabetes, Hypertension, Type II Diabetes, N/A ?Neuropathy Neuropathy ?Date Acquired: 03/03/2021 02/03/2021 N/A ?Weeks of Treatment: 3 3  N/A ?Wound Status: Open Open N/A ?Wound Recurrence: No No N/A ?Measurements L x W x D (cm) 0.4x0.2x0.1 14x16x0.1 N/A ?Area (cm?) : 0.063 175.929 N/A ?Volume (cm?) : 0.006 17.593 N/A ?% Reduction in Area: 92.00% -146.20% N/A ?% Reduction in Volume: 92.40% -146.20% N/A ?Classification: Grade 2 Grade 2 N/A ?Exudate Amount: Medium Medium N/A ?Exudate Type: Serosanguineous Serosanguineous N/A ?Exudate Color: red, brown red, brown N/A ?Granulation Amount: Medium (34-66%) Medium (34-66%) N/A ?Granulation Quality: N/A Red, Pink N/A ?Necrotic Amount: Medium (34-66%) Medium (34-66%) N/A ?Necrotic Tissue: Adherent Sea BrightExposed Structures: ?Fat Layer (Subcutaneous Tissue): Fat Layer (Subcutaneous Tissue): N/A ?Yes Yes ?Fascia: No ?Fascia: No ?Tendon: No ?Tendon: No ?Muscle: No ?Muscle: No ?Joint: No ?Joint: No ?Bone: No ?Bone: No ?Epithelialization: None N/A N/A ?MAALIK, PINN. ([id number]) ?Treatment Notes ?Electronic Signature(s) ?Signed: 05/04/2021 4:17:52 PM By: Donnamarie Poag ?Entered ByDonnamarie Poag on 05/04/2021 08:50:09 ?RENDER, MARLEY ([id number]) ?-------------------------------------------------------------------------------- ?Multi-Disciplinary Care Plan Details ?Patient Name: Shane Bean ?Date of Service: 05/04/2021 8:30 AM ?Medical Record Number: [id number] ?Patient Account Number: [id number] ?Date of Birth/Sex: 10/08/1955 (66 y.o. M) ?Treating RN: Donnamarie Poag ?Primary Care Julies Carmickle: Re[facility] Other Clinician: ?Referring Matalyn Nawaz: Re[facility] Dam ?Treating Tashyra Adduci/Extender: Jeri Cos ?Weeks in Treatment: 3 ?Active Inactive ?Necrotic Tissue ?Nursing Diagnoses: ?Impaired tissue integrity related to necrotic/devitalized tissue ?Knowledge deficit related to management of necrotic/devitalized tissue ?Goals: ?Necrotic/devitalized tissue will be minimized in the wound bed ?Date Initiated: 04/15/2021 ?Target Resolution Date: 04/15/2021 ?Goal Status: Active ?Patient/caregiver will  verbalize understanding of reason and process for debridement of necrotic tissue ?Date Initiated: 04/15/2021 ?Target Resolution Date: 04/15/2021 ?Goal Status: Active ?Interventions: ?Assess patient pain level pre-, durin

## 2021-05-04 NOTE — Progress Notes (Addendum)
JACION, DISMORE ([id number]) ?Visit Report for 05/04/2021 ?Chief Complaint Document Details ?Patient Name: Shane Bean ?Date of Service: 05/04/2021 8:30 AM ?Medical Record Number: [id number] ?Patient Account Number: [id number] ?Date of Birth/Sex: 1955-12-31 (66 y.o. M) ?Treating RN: Donnamarie Poag ?Primary Care Provider: Reeves Dam Other Clinician: ?Referring Provider: Reeves Dam ?Treating Provider/Extender: Jeri Cos ?Weeks in Treatment: 3 ?Information Obtained from: Patient ?Chief Complaint ?Left LE Ulcer ?Electronic Signature(s) ?Signed: 05/04/2021 8:40:13 AM By: Worthy Keeler PA-C ?Entered By: Worthy Keeler on 05/04/2021 08:40:13 ?MAVRYK, PINO ([id number]) ?-------------------------------------------------------------------------------- ?HPI Details ?Patient Name: Shane Bean ?Date of Service: 05/04/2021 8:30 AM ?Medical Record Number: [id number] ?Patient Account Number: [id number] ?Date of Birth/Sex: 06-22-1955 (66 y.o. M) ?Treating RN: Donnamarie Poag ?Primary Care Provider: Reeves Dam Other Clinician: ?Referring Provider: Reeves Dam ?Treating Provider/Extender: Jeri Cos ?Weeks in Treatment: 3 ?History of Present Illness ?HPI Description: 05/18/2020 upon evaluation today patient appears for initial evaluation here in clinic concerning issues that he has been having ?with the wound on his left lateral leg. Fortunately there does not appear to be any signs of obvious an active infection at this time which is great ?news. With that being said the patient unfortunately is continued to have issues with pain although he tells me it is gotten a lot better. He was on ?Keflex as well as Bactrim DS which seems to have done a good job there. His most recent hemoglobin A1c was 9.8 that was on 04/14/2020. ?Subsequently I do feel like that the patient is making progress here. He does have a history of chronic venous insufficiency as well as ?hypertension. I do not see any need for antibiotics at this  point. ?5/25; follow-up of the wound on the left posterior calf. This is completely necrotic on the surface. It does not look infected but it is painful. He is a ?diabetic but I think there is some suggestion here of chronic venous disease as well. We used Iodoflex under compression last week ?6/1; again a completely necrotic surface on this with the wound. We have been using Iodoflex under compression he complains of gnawing pain. ?He has had wounds previously a lot of this looks like chronic venous disease. ?6/8; about two thirds of the surface of this wound is still covered in a black necrotic eschar. I changed him from Iodoflex to [place]st last week ?because of complaints of pain. He actually was seen by her neurologist this weeko Peripheral neuropathy as a cause of the pain and they changed ?him from Neurontin to Lyrica but he still has not had any relief. He says that most of the pain however is in his heel not in his wound per se. He is ?a diabetic ?6/15. This is a patient with what looks to be a venous wound on the left lateral lower leg. He is also a diabetic. Currently being worked up for ?diabetic neuropathy. He complains of pain out of proportion to the size of the wound although to be fair the entire area here was eschared. We ?have been working to get a viable surface. We are using Sorbact ?6/22; fairly painful wound on the left posterior calf. I am assuming this is been venous he is also a diabetic. His ABI in our clinic was ?noncompressible however he has easily palpable pulses on his feet. He complains of a lot of pain in the wound also of the left heel and the upper ?left calf. Some of this may be neuropathy. It is led me to discontinue  Iodoflex reduce his compression but he still complains of pain in fact he says ?he could not take a debridement today. When I first saw this it was completely necrotic surface we have got it down to something that looks a ?reasonably healthy ?I have been wondering  about biopsying this area however he is on Coumadin and with the pain I have put this off. The major question would be ?an inflammatory ulcer such as pyoderma. The patient is not aware of how this started. He does however have a wound history on both legs. ?6/29; patient presents for 1 week follow-up. He has been using Hydrofera Blue under Kerlix/Coban. He has tolerated this well. He currently denies ?signs of infection. He also declines debridement today. He he states it is painful and does not want to have this done. ?7/6; Patient presents for 1 week follow-up. He has been using Hydrofera Blue under Kerlix/Coban. He denies signs of infection. He declines ?debridement today. ?7/15; patient presents for 1 week follow-up. He has been using Hydrofera Blue under Kerlix/Coban. He denies signs of infection today. He is ?agreeable to having debridement done today. ?7/20; patient presents for 1 week follow-up. He is in good spirits today. He has been using Hydrofera Blue under Kerlix/Coban. He denies signs of ?infection today. ?7/27; patient presents for 1 week follow-up. He is in good spirits today. He has no issues or complaints today. He denies signs of infection. ?08/12/2020 upon evaluation today patient appears to be doing better in regard to his wound. He has been tolerating the dressing changes without ?complication. With that being said he did have arterial studies and does show that he has a TBI on the right of 0.90 and a TBI on the left of 0.76 ?which is excellent. There is no evidence of arterial compromise at this point. ?8/17; patient presents for 1 week follow-up. He has no issues or complaints today. He denies signs of infection. ?8/24; patient presents for 1 week follow-up. He has no issues or complaints today. He denies signs of infection. He continues to tolerate the ?compression wrap well. ?8/31; patient presents for 1 week follow-up. He has no issues or complaints today. He denies signs of infection. He  is in good spirits today. ?9/7; patient presents for 1 week follow-up. He is tolerated the compression wrap well. He has no issues or complaints today. He denies signs of ?infection. ?9/14; patient presents for 1 week follow-up. He has no issues or complaints today. He denies signs of infection. He is in excellent spirits today. ?9/21; patient presents for 1 week follow-up. He has no issues or complaints today. ?9/28; patient presents for 1 week follow-up. He has tolerated the compression wrap well. He has no issues or complaints today. ?10/5; patient presents for 1 week follow-up. He has no issues or complaints today. ?10/12; patient presents for 1 week follow-up. He has no issues or complaints today. He denies signs of infection. COBAIN, MORICI ([id number]) ?Readmission: ?04-08-2021 upon evaluation today patient appears to be doing poorly in regard to his right lower extremity. He is being seen for reevaluation here in ?the clinic he was last discharged healed in October 2022. Subsequently at that time it was noted in the chart that he was told to be wearing ?compression socks unfortunately the patient tells me he never remembers being told that. I did discuss this with him today as well as Hosie Poisson ?discussing it with him today again. Obviously he does know at this point once he  is healed he should be wearing compression socks all the time ?from the time he gets up in the morning to when he goes to sleep at night day in and day out. I think this is the only way that you can to keep ?things under control. Otherwise his medical history has not changed his arterial study in the past was excellent and did not appear to show any ?signs of issue at this point which is great news as well. ?04-15-2021 upon evaluation today patient's wound is actually looking much drier than what it was last week. Again I do believe the infection is ?showing signs of improvement although he is having pain for sore I think this has to do  more with the dryness of the wound at this time as ?opposed to the infection that we were noted in the last week. Fortunately I do not see any evidence of active infection locally or systemically at ?this

## 2021-05-13 ENCOUNTER — Encounter: Payer: Medicare (Managed Care) | Admitting: Physician Assistant

## 2021-05-13 DIAGNOSIS — E11622 Type 2 diabetes mellitus with other skin ulcer: Secondary | ICD-10-CM | POA: Diagnosis not present

## 2021-05-13 NOTE — Progress Notes (Addendum)
AURELIO, MCCAMY ([id number]) ?Visit Report for 05/13/2021 ?Chief Complaint Document Details ?Patient Name: Shane Bean ?Date of Service: 05/13/2021 2:00 PM ?Medical Record Number: [id number] ?Patient Account Number: [id number] ?Date of Birth/Sex: 04-05-1955 (66 y.o. M) ?Treating RN: Carlene Coria ?Primary Care Provider: Reeves Dam Other Clinician: ?Referring Provider: Reeves Dam ?Treating Provider/Extender: Jeri Cos ?Weeks in Treatment: 5 ?Information Obtained from: Patient ?Chief Complaint ?Left LE Ulcer ?Electronic Signature(s) ?Signed: 05/13/2021 2:04:22 PM By: Worthy Keeler PA-C ?Entered By: Worthy Keeler on 05/13/2021 14:04:22 ?DONNAVAN, COVAULT ([id number]) ?-------------------------------------------------------------------------------- ?Debridement Details ?Patient Name: Shane Bean ?Date of Service: 05/13/2021 2:00 PM ?Medical Record Number: [id number] ?Patient Account Number: [id number] ?Date of Birth/Sex: 1955-12-08 (66 y.o. M) ?Treating RN: Carlene Coria ?Primary Care Provider: Reeves Dam Other Clinician: ?Referring Provider: Reeves Dam ?Treating Provider/Extender: Jeri Cos ?Weeks in Treatment: 5 ?Debridement Performed for ?Wound #3 Right,Lateral,Circumferential Lower Leg ?Assessment: ?Performed By: Physician Tommie Sams., PA-C ?Debridement Type: Debridement ?Severity of Tissue Pre Debridement: Fat layer exposed ?Level of Consciousness (Pre- ?Awake and Alert ?procedure): ?Pre-procedure Verification/Time Out ?Yes - 14:15 ?Taken: ?Start Time: 14:15 ?Total Area Debrided (L x W): 5 (cm) x 5 (cm) = 25 (cm?) ?Tissue and other material ?Viable, Non-Viable, Slough, Subcutaneous, Avalon ?debrided: ?Level: Skin/Subcutaneous Tissue ?Debridement Description: Excisional ?Instrument: Curette ?Bleeding: Minimum ?Hemostasis Achieved: Pressure ?End Time: 14:22 ?Procedural Pain: 0 ?Post Procedural Pain: 0 ?Response to Treatment: Procedure was tolerated well ?Level of Consciousness  (Post- ?Awake and Alert ?procedure): ?Post Debridement Measurements of Total Wound ?Length: (cm) 14 ?Width: (cm) 15 ?Depth: (cm) 0.3 ?Volume: (cm?) 49.48 ?Character of Wound/Ulcer Post Debridement: Improved ?Severity of Tissue Post Debridement: Fat layer exposed ?Post Procedure Diagnosis ?Same as Pre-procedure ?Electronic Signature(s) ?Signed: 05/14/2021 8:03:27 AM By: Worthy Keeler PA-C ?Signed: 05/18/2021 10:35:39 AM By: Carlene Coria RN ?Entered By: Carlene Coria on 05/13/2021 14:21:05 ?KAO, CONRY ([id number]) ?-------------------------------------------------------------------------------- ?HPI Details ?Patient Name: Shane Bean ?Date of Service: 05/13/2021 2:00 PM ?Medical Record Number: [id number] ?Patient Account Number: [id number] ?Date of Birth/Sex: 1955-10-28 (66 y.o. M) ?Treating RN: Carlene Coria ?Primary Care Provider: Reeves Dam Other Clinician: ?Referring Provider: Reeves Dam ?Treating Provider/Extender: Jeri Cos ?Weeks in Treatment: 5 ?History of Present Illness ?HPI Description: 05/18/2020 upon evaluation today patient appears for initial evaluation here in clinic concerning issues that he has been having ?with the wound on his left lateral leg. Fortunately there does not appear to be any signs of obvious an active infection at this time which is great ?news. With that being said the patient unfortunately is continued to have issues with pain although he tells me it is gotten a lot better. He was on ?Keflex as well as Bactrim DS which seems to have done a good job there. His most recent hemoglobin A1c was 9.8 that was on 04/14/2020. ?Subsequently I do feel like that the patient is making progress here. He does have a history of chronic venous insufficiency as well as ?hypertension. I do not see any need for antibiotics at this point. ?5/25; follow-up of the wound on the left posterior calf. This is completely necrotic on the surface. It does not look infected but it is painful. He is  a ?diabetic but I think there is some suggestion here of chronic venous disease as well. We used Iodoflex under compression last week ?6/1; again a completely necrotic surface on this with the wound. We have been using Iodoflex under compression he complains of gnawing pain. ?He has had wounds previously a lot of this  looks like chronic venous disease. ?6/8; about two thirds of the surface of this wound is still covered in a black necrotic eschar. I changed him from Iodoflex to Pierz last week ?because of complaints of pain. He actually was seen by her neurologist this weeko Peripheral neuropathy as a cause of the pain and they changed ?him from Neurontin to Lyrica but he still has not had any relief. He says that most of the pain however is in his heel not in his wound per se. He is ?a diabetic ?6/15. This is a patient with what looks to be a venous wound on the left lateral lower leg. He is also a diabetic. Currently being worked up for ?diabetic neuropathy. He complains of pain out of proportion to the size of the wound although to be fair the entire area here was eschared. We ?have been working to get a viable surface. We are using Sorbact ?6/22; fairly painful wound on the left posterior calf. I am assuming this is been venous he is also a diabetic. His ABI in our clinic was ?noncompressible however he has easily palpable pulses on his feet. He complains of a lot of pain in the wound also of the left heel and the upper ?left calf. Some of this may be neuropathy. It is led me to discontinue Iodoflex reduce his compression but he still complains of pain in fact he says ?he could not take a debridement today. When I first saw this it was completely necrotic surface we have got it down to something that looks a ?reasonably healthy ?I have been wondering about biopsying this area however he is on Coumadin and with the pain I have put this off. The major question would be ?an inflammatory ulcer such as pyoderma.  The patient is not aware of how this started. He does however have a wound history on both legs. ?6/29; patient presents for 1 week follow-up. He has been using Hydrofera Blue under Kerlix/Coban. He has tolerated this well. He currently denies ?signs of infection. He also declines debridement today. He he states it is painful and does not want to have this done. ?7/6; Patient presents for 1 week follow-up. He has been using Hydrofera Blue under Kerlix/Coban. He denies signs of infection. He declines ?debridement today. ?7/15; patient presents for 1 week follow-up. He has been using Hydrofera Blue under Kerlix/Coban. He denies signs of infection today. He is ?agreeable to having debridement done today. ?7/20; patient presents for 1 week follow-up. He is in good spirits today. He has been using Hydrofera Blue under Kerlix/Coban. He denies signs of ?infection today. ?7/27; patient presents for 1 week follow-up. He is in good spirits today. He has no issues or complaints today. He denies signs of infection. ?08/12/2020 upon evaluation today patient appears to be doing better in regard to his wound. He has been tolerating the dressing changes without ?complication. With that being said he did have arterial studies and does show that he has a TBI on the right of 0.90 and a TBI on the left of 0.76 ?which is excellent. There is no evidence of arterial compromise at this point. ?8/17; patient presents for 1 week follow-up. He has no issues or complaints today. He denies signs of infection. ?8/24; patient presents for 1 week follow-up. He has no issues or complaints today. He denies signs of infection. He continues to tolerate the ?compression wrap well. ?8/31; patient presents for 1 week follow-up. He has no issues or complaints  today. He denies signs of infection. He is in good spirits today. ?9/7; patient presents for 1 week follow-up. He is tolerated the compression wrap well. He has no issues or complaints today. He  denies signs of ?infection. ?9/14; patient presents for 1 week follow-up. He has no issues or complaints today. He denies signs of infection. He is in excellent spirits today. ?9/21; patient presents for 1 week foll

## 2021-05-18 NOTE — Progress Notes (Signed)
Shane Bean, Shane Bean ([id number]) ?Visit Report for 05/13/2021 ?Arrival Information Details ?Patient Name: Shane Bean ?Date of Service: 05/13/2021 2:00 PM ?Medical Record Number: [id number] ?Patient Account Number: [id number] ?Date of Birth/Sex: Oct 05, 1955 (66 y.o. M) ?Treating RN: Carlene Coria ?Primary Care Ashutosh Dieguez: Reeves Dam Other Clinician: ?Referring Mirko Tailor: Reeves Dam ?Treating Madelene Kaatz/Extender: Jeri Cos ?Weeks in Treatment: 5 ?Visit Information History Since Last Visit ?All ordered tests and consults were completed: No ?Patient Arrived: Ambulatory ?Added or deleted any medications: No ?Arrival Time: 13:53 ?Any new allergies or adverse reactions: No ?Accompanied By: self ?Had a fall or experienced change in No ?Transfer Assistance: None ?activities of daily living that may affect ?Patient Identification Verified: Yes ?risk of falls: ?Secondary Verification Process Completed: Yes ?Signs or symptoms of abuse/neglect since last visito No ?Patient Requires Transmission-Based No ?Hospitalized since last visit: No ?Precautions: ?Implantable device outside of the clinic excluding No ?Patient Has Alerts: Yes ?cellular tissue based products placed in the center ?Patient Alerts: Patient on Blood ?since last visit: ?Thinner ?Has Dressing in Place as Prescribed: Yes ?ABI R [place] TBI .90 7/22 ?Has Compression in Place as Prescribed: Yes ?ABI L 1.46 TBI .76 ?7/22 Pain Present Now: Yes ?Electronic Signature(s) ?Signed: 05/18/2021 10:35:39 AM By: Carlene Coria RN ?Entered By: Carlene Coria on 05/13/2021 13:53:47 ?Shane Bean, Shane Bean ([id number]) ?-------------------------------------------------------------------------------- ?Clinic Level of Care Assessment Details ?Patient Name: Shane Bean ?Date of Service: 05/13/2021 2:00 PM ?Medical Record Number: [id number] ?Patient Account Number: [id number] ?Date of Birth/Sex: May 01, 1955 (66 y.o. M) ?Treating RN: Carlene Coria ?Primary Care Dylynn Ketner: Reeves Dam Other  Clinician: ?Referring Jiro Kiester: Reeves Dam ?Treating Baby Stairs/Extender: Jeri Cos ?Weeks in Treatment: 5 ?Clinic Level of Care Assessment Items ?TOOL 1 Quantity Score ?'[]'$  - Use when EandM and Procedure is performed on INITIAL visit 0 ?ASSESSMENTS - Nursing Assessment / Reassessment ?'[]'$  - General Physical Exam (combine w/ comprehensive assessment (listed just below) when performed on new ?0 ?pt. evals) ?'[]'$  - 0 ?Comprehensive Assessment (HX, ROS, Risk Assessments, Wounds Hx, etc.) ?ASSESSMENTS - Wound and Skin Assessment / Reassessment ?'[]'$  - Dermatologic / Skin Assessment (not related to wound area) 0 ?ASSESSMENTS - Ostomy and/or Continence Assessment and Care ?'[]'$  - Incontinence Assessment and Management 0 ?'[]'$  - 0 ?Ostomy Care Assessment and Management (repouching, etc.) ?PROCESS - Coordination of Care ?'[]'$  - Simple Patient / Family Education for ongoing care 0 ?'[]'$  - 0 ?Complex (extensive) Patient / Family Education for ongoing care ?'[]'$  - 0 ?Staff obtains Consents, Records, Test Results / Process Orders ?'[]'$  - 0 ?Staff telephones [place], Nursing Homes / Clarify orders / etc ?'[]'$  - 0 ?Routine Transfer to another Facility (non-emergent condition) ?'[]'$  - 0 ?Routine Hospital Admission (non-emergent condition) ?'[]'$  - 0 ?New Admissions / Biomedical engineer / Ordering NPWT, Apligraf, etc. ?'[]'$  - 0 ?Emergency Hospital Admission (emergent condition) ?PROCESS - Special Needs ?'[]'$  - Pediatric / Minor Patient Management 0 ?'[]'$  - 0 ?Isolation Patient Management ?'[]'$  - 0 ?Hearing / Language / Visual special needs ?'[]'$  - 0 ?Assessment of Community assistance (transportation, D/C planning, etc.) ?'[]'$  - 0 ?Additional assistance / Altered mentation ?'[]'$  - 0 ?Support Surface(s) Assessment (bed, cushion, seat, etc.) ?INTERVENTIONS - Miscellaneous ?'[]'$  - External ear exam 0 ?'[]'$  - 0 ?Patient Transfer (multiple staff / Civil Service fast streamer / Similar devices) ?'[]'$  - 0 ?Simple Staple / Suture removal (25 or less) ?'[]'$  - 0 ?Complex Staple / Suture removal (26  or more) ?'[]'$  - 0 ?Hypo/Hyperglycemic Management (do not check if billed separately) ?'[]'$  - 0 ?Ankle / Brachial Index (ABI) -  do not check if billed separately ?Has the patient been seen at the hospital within the last three years: Yes ?Total Score: 0 ?Level Of Care: ____ ?Shane Bean, Shane Bean ([id number]) ?Electronic Signature(s) ?Signed: 05/18/2021 10:35:39 AM By: Carlene Coria RN ?Entered By: Carlene Coria on 05/13/2021 14:21:20 ?Shane Bean ([id number]) ?-------------------------------------------------------------------------------- ?Encounter Discharge Information Details ?Patient Name: Shane Bean ?Date of Service: 05/13/2021 2:00 PM ?Medical Record Number: [id number] ?Patient Account Number: [id number] ?Date of Birth/Sex: Nov 26, 1955 (66 y.o. M) ?Treating RN: Carlene Coria ?Primary Care Joaopedro Eschbach: Reeves Dam Other Clinician: ?Referring Gabryel Talamo: Reeves Dam ?Treating Dastan Krider/Extender: Jeri Cos ?Weeks in Treatment: 5 ?Encounter Discharge Information Items Post Procedure Vitals ?Discharge Condition: Stable ?Temperature (?F): 98.4 ?Ambulatory Status: Ambulatory ?Pulse (bpm): 81 ?Discharge Destination: Home ?Respiratory Rate (breaths/min): 16 ?Transportation: Private Auto ?Blood Pressure (mmHg): 147/75 ?Accompanied By: self ?Schedule Follow-up Appointment: Yes ?Clinical Summary of Care: Patient Declined ?Electronic Signature(s) ?Signed: 05/18/2021 10:35:39 AM By: Carlene Coria RN ?Entered By: Carlene Coria on 05/13/2021 14:23:01 ?Shane Bean, Shane Bean ([id number]) ?-------------------------------------------------------------------------------- ?Lower Extremity Assessment Details ?Patient Name: Shane Bean ?Date of Service: 05/13/2021 2:00 PM ?Medical Record Number: [id number] ?Patient Account Number: [id number] ?Date of Birth/Sex: 05-12-1955 (66 y.o. M) ?Treating RN: Carlene Coria ?Primary Care Evalynne Locurto: Reeves Dam Other Clinician: ?Referring Lekesha Claw: Reeves Dam ?Treating Ilena Dieckman/Extender: Jeri Cos ?Weeks in Treatment: 5 ?Edema Assessment ?Assessed: [Left: No] [Right: No] ?[Left: Edema] [Right: :] ?Calf ?Left: Right: ?Point of Measurement: 35 cm From Medial Instep 47 cm ?Ankle ?Left: Right: ?Point of Measurement: 10 cm From Medial Instep 28 cm ?Knee To Floor ?Left: Right: ?From Medial Instep 43 cm ?Vascular Assessment ?Pulses: ?Dorsalis Pedis ?Palpable: [Right:Yes] ?Electronic Signature(s) ?Signed: 05/18/2021 10:35:39 AM By: Carlene Coria RN ?Entered By: Carlene Coria on 05/13/2021 14:08:16 ?Shane Bean, Shane Bean ([id number]) ?-------------------------------------------------------------------------------- ?Multi Wound Chart Details ?Patient Name: Shane Bean ?Date of Service: 05/13/2021 2:00 PM ?Medical Record Number: [id number] ?Patient Account Number: [id number] ?Date of Birth/Sex: [birth year] (66 y.o. 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 of the Lower N/A ?Extremity Extremity ?Comorbid History: Hypertension, Type II Diabetes, Hypertension, Type II Diabetes, N/A ?Neuropathy Neuropathy ?Date Acquired: 03/03/2021 02/03/2021 N/A ?Weeks of Treatment: 5 5 N/A ?Wound Status: Healed - Epithelialized Open N/A ?Wound Recurrence: No No N/A ?Measurements L x W x D (cm) 0x0x0 14x15x0.3 N/A ?Area (cm?) : 0 164.934 N/A ?Volume (cm?) : 0 49.48 N/A ?% Reduction in Area: 100.00% -130.80% N/A ?% Reduction in Volume: 100.00% -592.30% N/A ?Classification: Grade 2 Grade 2  N/A ?Exudate Amount: None Present Medium N/A ?Exudate Type: N/A Serosanguineous N/A ?Exudate Color: N/A red, brown N/A ?Granulation Amount: None Present (0%) Medium (34-66%) N/A ?Granulation Quality: N/

## 2021-05-20 ENCOUNTER — Encounter: Payer: Medicare (Managed Care) | Admitting: Physician Assistant

## 2021-05-20 DIAGNOSIS — E11622 Type 2 diabetes mellitus with other skin ulcer: Secondary | ICD-10-CM | POA: Diagnosis not present

## 2021-05-20 NOTE — Progress Notes (Addendum)
PIETER, FOOKS ([id number]) Visit Report for 05/20/2021 Chief Complaint Document Details Patient Name: Shane Bean, Shane Bean Date of Service: 05/20/2021 11:30 AM Medical Record Number: [id number] Patient Account Number: [id number] Date of Birth/Sex: 1955-03-24 (66 y.o. M) Treating RN: Levora Dredge Primary Care Provider: Reeves Dam Other Clinician: Referring Provider: Reeves Dam Treating Provider/Extender: Skipper Cliche in Treatment: 6 Information Obtained from: Patient Chief Complaint Left LE Ulcer Electronic Signature(s) Signed: 05/20/2021 12:03:21 PM By: Worthy Keeler PA-C Entered By: Worthy Keeler on 05/20/2021 12:03:21 Shane Bean ([id number]) -------------------------------------------------------------------------------- HPI Details Patient Name: Shane Bean Date of Service: 05/20/2021 11:30 AM Medical Record Number: [id number] Patient Account Number: [id number] Date of Birth/Sex: 03-30-1955 (66 y.o. M) Treating RN: Levora Dredge Primary Care Provider: Reeves Dam Other Clinician: Referring Provider: Reeves Dam Treating Provider/Extender: Skipper Cliche in Treatment: 6 History of Present Illness HPI Description: 05/18/2020 upon evaluation today patient appears for initial evaluation here in clinic concerning issues that he has been having with the wound on his left lateral leg. Fortunately there does not appear to be any signs of obvious an active infection at this time which is great news. With that being said the patient unfortunately is continued to have issues with pain although he tells me it is gotten a lot better. He was on Keflex as well as Bactrim DS which seems to have done a good job there. His most recent hemoglobin A1c was 9.8 that was on 04/14/2020. Subsequently I do feel like that the patient is making progress here. He does have a history of chronic venous insufficiency as well as hypertension. I do not see any need for  antibiotics at this point. 5/25; follow-up of the wound on the left posterior calf. This is completely necrotic on the surface. It does not look infected but it is painful. He is a diabetic but I think there is some suggestion here of chronic venous disease as well. We used Iodoflex under compression last week 6/1; again a completely necrotic surface on this with the wound. We have been using Iodoflex under compression he complains of gnawing pain. He has had wounds previously a lot of this looks like chronic venous disease. 6/8; about two thirds of the surface of this wound is still covered in a black necrotic eschar. I changed him from Iodoflex to [place] last week because of complaints of pain. He actually was seen by her neurologist this weeko Peripheral neuropathy as a cause of the pain and they changed him from Neurontin to Lyrica but he still has not had any relief. He says that most of the pain however is in his heel not in his wound per se. He is a diabetic 6/15. This is a patient with what looks to be a venous wound on the left lateral lower leg. He is also a diabetic. Currently being worked up for diabetic neuropathy. He complains of pain out of proportion to the size of the wound although to be fair the entire area here was eschared. We have been working to get a viable surface. We are using Sorbact 6/22; fairly painful wound on the left posterior calf. I am assuming this is been venous he is also a diabetic. His ABI in our clinic was noncompressible however he has easily palpable pulses on his feet. He complains of a lot of pain in the wound also of the left heel and the upper left calf. Some of this may be neuropathy. It is led me to discontinue  Iodoflex reduce his compression but he still complains of pain in fact he says he could not take a debridement today. When I first saw this it was completely necrotic surface we have got it down to something that looks a reasonably healthy I  have been wondering about biopsying this area however he is on Coumadin and with the pain I have put this off. The major question would be an inflammatory ulcer such as pyoderma. The patient is not aware of how this started. He does however have a wound history on both legs. 6/29; patient presents for 1 week follow-up. He has been using Hydrofera Blue under Kerlix/Coban. He has tolerated this well. He currently denies signs of infection. He also declines debridement today. He he states it is painful and does not want to have this done. 7/6; Patient presents for 1 week follow-up. He has been using Hydrofera Blue under Kerlix/Coban. He denies signs of infection. He declines debridement today. 7/15; patient presents for 1 week follow-up. He has been using Hydrofera Blue under Kerlix/Coban. He denies signs of infection today. He is agreeable to having debridement done today. 7/20; patient presents for 1 week follow-up. He is in good spirits today. He has been using Hydrofera Blue under Kerlix/Coban. He denies signs of infection today. 7/27; patient presents for 1 week follow-up. He is in good spirits today. He has no issues or complaints today. He denies signs of infection. 08/12/2020 upon evaluation today patient appears to be doing better in regard to his wound. He has been tolerating the dressing changes without complication. With that being said he did have arterial studies and does show that he has a TBI on the right of 0.90 and a TBI on the left of 0.76 which is excellent. There is no evidence of arterial compromise at this point. 8/17; patient presents for 1 week follow-up. He has no issues or complaints today. He denies signs of infection. 8/24; patient presents for 1 week follow-up. He has no issues or complaints today. He denies signs of infection. He continues to tolerate the compression wrap well. 8/31; patient presents for 1 week follow-up. He has no issues or complaints today. He denies  signs of infection. He is in good spirits today. 9/7; patient presents for 1 week follow-up. He is tolerated the compression wrap well. He has no issues or complaints today. He denies signs of infection. 9/14; patient presents for 1 week follow-up. He has no issues or complaints today. He denies signs of infection. He is in excellent spirits today. 9/21; patient presents for 1 week follow-up. He has no issues or complaints today. 9/28; patient presents for 1 week follow-up. He has tolerated the compression wrap well. He has no issues or complaints today. 10/5; patient presents for 1 week follow-up. He has no issues or complaints today. 10/12; patient presents for 1 week follow-up. He has no issues or complaints today. He denies signs of infection. Shane Bean, Shane Bean ([id number]) Readmission: 04-08-2021 upon evaluation today patient appears to be doing poorly in regard to his right lower extremity. He is being seen for reevaluation here in the clinic he was last discharged healed in October 2022. Subsequently at that time it was noted in the chart that he was told to be wearing compression socks unfortunately the patient tells me he never remembers being told that. I did discuss this with him today as well as Hosie Poisson discussing it with him today again. Obviously he does know at this point once he  is healed he should be wearing compression socks all the time from the time he gets up in the morning to when he goes to sleep at night day in and day out. I think this is the only way that you can to keep things under control. Otherwise his medical history has not changed his arterial study in the past was excellent and did not appear to show any signs of issue at this point which is great news as well. 04-15-2021 upon evaluation today patient's wound is actually looking much drier than what it was last week. Again I do believe the infection is showing signs of improvement although he is having pain for sore I  think this has to do more with the dryness of the wound at this time as opposed to the infection that we were noted in the last week. Fortunately I do not see any evidence of active infection locally or systemically at this time which is great news. 04-22-2021 upon evaluation today patient unfortunately continues to have issues with his leg. This is getting significantly worse despite being on Augmentin. I am actually extremely concerned about worsening infection and especially considering the way the wound looks today and the odor present. 05-04-2021 upon evaluation today patient appears to be doing better with regard to the infection in his leg. He has been tolerating the dressing changes without complication and overall I am extremely pleased with where we stand. There does not appear to be any evidence of active infection locally or systemically which is great news. 05-13-2021 upon evaluation today patient appears to be doing better in regard to his leg ulcer. He has been tolerating the dressing changes without complication. He does have still significant pain he does have a lot of necrotic tissue which is working its way off and there is no signs of significant infection at this point which is great news. With that being said I do believe that the patient does need to likely continue with the wound care measures at this point with regard to the dressing that we are utilizing. 05-20-2021 upon evaluation today patient's wound is actually showing signs of fairly good granulation and epithelization in a lot of areas he still has some slough noted at this point though a lot of the eschar is softening up I do believe the Xeroform has been helpful in this regard. Fortunately I do not see any evidence of active infection locally or systemically which is great news. No fevers, chills, nausea, vomiting, or diarrhea. I do believe the 3 layer compression wrap has been beneficial. Electronic  Signature(s) Signed: 05/20/2021 12:13:24 PM By: Worthy Keeler PA-C Entered By: Worthy Keeler on 05/20/2021 12:13:24 Shane Bean ([id number]) -------------------------------------------------------------------------------- Physical Exam Details Patient Name: Shane Bean Date of Service: 05/20/2021 11:30 AM Medical Record Number: [id number] Patient Account Number: [id number] Date of Birth/Sex: 1955-12-28 (66 y.o. M) Treating RN: Levora Dredge Primary Care Provider: Reeves Dam Other Clinician: Referring Provider: Reeves Dam Treating Provider/Extender: Jeri Cos Weeks in Treatment: 6 Constitutional Well-nourished and well-hydrated in no acute distress. Respiratory normal breathing without difficulty. Psychiatric this patient is able to make decisions and demonstrates good insight into disease process. Alert and Oriented x 3. pleasant and cooperative. Notes Upon inspection patient's wound bed showed evidence of good granulation and epithelization at this point. Fortunately I do not see any signs of infection locally or systemically which is great news and overall I am extremely pleased with where we stand today. Electronic Signature(s)  Signed: 05/20/2021 12:13:37 PM By: Worthy Keeler PA-C Entered By: Worthy Keeler on 05/20/2021 12:13:37 Shane Bean ([id number]) -------------------------------------------------------------------------------- Physician Orders Details Patient Name: Shane Bean Date of Service: 05/20/2021 11:30 AM Medical Record Number: [id number] Patient Account Number: [id number] Date of Birth/Sex: 1955/04/13 (66 y.o. M) Treating RN: Donnamarie Poag Primary Care Provider: Reeves Dam Other Clinician: Referring Provider: Reeves Dam Treating Provider/Extender: Skipper Cliche in Treatment: 6 Verbal / Phone Orders: No Diagnosis Coding ICD-10 Coding Code Description E11.622 Type 2 diabetes mellitus with other skin  ulcer I87.2 Venous insufficiency (chronic) (peripheral) 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 layer exposed L97.812 Non-pressure chronic ulcer of other part of right lower leg with fat layer exposed I10 Essential (primary) hypertension Follow-up Appointments o Return Appointment in 1 week. - weekly wrap o Nurse Visit as needed Bathing/ Shower/ Hygiene o May shower; gently cleanse wound with antibacterial soap, rinse and pat dry prior to dressing wounds o May shower with wound dressing protected with water repellent cover or cast protector. o No tub bath. Anesthetic (Use 'Patient Medications' Section for Anesthetic Order Entry) o Lidocaine applied to wound bed Edema Control - Lymphedema / Segmental Compressive Device / Other o Elevate, Exercise Daily and Avoid Standing for Long Periods of Time. o Elevate legs to the level of the heart and pump ankles as often as possible o Elevate leg(s) parallel to the floor when sitting. Additional Orders / Instructions o Follow Nutritious Diet and Increase Protein Intake - monitor blood sugar Wound Treatment Wound #3 - Lower Leg Wound Laterality: Right, Lateral, Circumferential Cleanser: Soap and Water 1 x Per Week/15 Days Discharge Instructions: Gently cleanse wound with antibacterial soap, rinse and pat dry prior to dressing wounds Cleanser: Wound Cleanser 1 x Per Week/15 Days Discharge Instructions: Wash your hands with soap and water. Remove old dressing, discard into plastic bag and place into trash. Cleanse the wound with Wound Cleanser prior to applying a clean dressing using gauze sponges, not tissues or cotton balls. Do not scrub or use excessive force. Pat dry using gauze sponges, not tissue or cotton balls. Primary Dressing: Xeroform 5x9-HBD (in/in) 1 x Per Week/15 Days Discharge Instructions: Apply Xeroform 5x9-HBD (in/in) as directed Secondary Dressing: ABD Pad 5x9 (in/in) 1 x Per  Week/15 Days Discharge Instructions: 2 pads to Cover with ABD pad Compression Wrap: 3-LAYER WRAP - Profore Lite LF 3 Multilayer Compression Bandaging System 1 x Per Week/15 Days Discharge Instructions: Apply 3 multi-layer wrap as prescribed. Electronic Signature(s) Signed: 05/20/2021 4:31:13 PM By: Donnamarie Poag Signed: 05/20/2021 5:51:16 PM By: Worthy Keeler PA-C Entered By: Donnamarie Poag on 05/20/2021 12:06:12 Shane Bean ([id number] Sers, Virgil Benedict ([id number]) -------------------------------------------------------------------------------- Problem List Details Patient Name: Shane Bean Date of Service: 05/20/2021 11:30 AM Medical Record Number: [id number] Patient Account Number: [id number] Date of Birth/Sex: 1955/02/02 (66 y.o. M) Treating RN: Levora Dredge Primary Care Provider: Reeves Dam Other Clinician: Referring Provider: Reeves Dam Treating Provider/Extender: Jeri Cos Weeks in Treatment: 6 Active Problems ICD-10 Encounter Code Description Active Date MDM Diagnosis E11.622 Type 2 diabetes mellitus with other skin ulcer 04/08/2021 No Yes I87.2 Venous insufficiency (chronic) (peripheral) 04/08/2021 No Yes 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 layer 04/08/2021 No Yes exposed L97.812 Non-pressure chronic ulcer of other part of right lower leg with fat layer 04/08/2021 No Yes exposed [place] (primary) hypertension 04/08/2021 No Yes Inactive Problems Resolved Problems Electronic Signature(s) Signed: 05/20/2021  11:55:05 AM By: Worthy Keeler PA-C Entered By: Worthy Keeler on 05/20/2021 11:55:04 Shane Bean ([id number]) -------------------------------------------------------------------------------- Progress Note Details Patient Name: Shane Bean Date of Service: 05/20/2021 11:30 AM Medical Record Number: [id number] Patient Account Number: [id number] Date of Birth/Sex: 07/16/1955 (66 y.o. M) Treating  RN: Levora Dredge Primary Care Provider: Reeves Dam Other Clinician: Referring Provider: Reeves Dam Treating Provider/Extender: Skipper Cliche in Treatment: 6 Subjective Chief Complaint Information obtained from Patient Left LE Ulcer History of Present Illness (HPI) 05/18/2020 upon evaluation today patient appears for initial evaluation here in clinic concerning issues that he has been having with the wound on his left lateral leg. Fortunately there does not appear to be any signs of obvious an active infection at this time which is great news. With that being said the patient unfortunately is continued to have issues with pain although he tells me it is gotten a lot better. He was on Keflex as well as Bactrim DS which seems to have done a good job there. His most recent hemoglobin A1c was 9.8 that was on 04/14/2020. Subsequently I do feel like that the patient is making progress here. He does have a history of chronic venous insufficiency as well as hypertension. I do not see any need for antibiotics at this point. 5/25; follow-up of the wound on the left posterior calf. This is completely necrotic on the surface. It does not look infected but it is painful. He is a diabetic but I think there is some suggestion here of chronic venous disease as well. We used Iodoflex under compression last week 6/1; again a completely necrotic surface on this with the wound. We have been using Iodoflex under compression he complains of gnawing pain. He has had wounds previously a lot of this looks like chronic venous disease. 6/8; about two thirds of the surface of this wound is still covered in a black necrotic eschar. I changed him from Iodoflex to [place] last week because of complaints of pain. He actually was seen by her neurologist this weeko Peripheral neuropathy as a cause of the pain and they changed him from Neurontin to Lyrica but he still has not had any relief. He says that most of the  pain however is in his heel not in his wound per se. He is a diabetic 6/15. This is a patient with what looks to be a venous wound on the left lateral lower leg. He is also a diabetic. Currently being worked up for diabetic neuropathy. He complains of pain out of proportion to the size of the wound although to be fair the entire area here was eschared. We have been working to get a viable surface. We are using Sorbact 6/22; fairly painful wound on the left posterior calf. I am assuming this is been venous he is also a diabetic. His ABI in our clinic was noncompressible however he has easily palpable pulses on his feet. He complains of a lot of pain in the wound also of the left heel and the upper left calf. Some of this may be neuropathy. It is led me to discontinue Iodoflex reduce his compression but he still complains of pain in fact he says he could not take a debridement today. When I first saw this it was completely necrotic surface we have got it down to something that looks a reasonably healthy I have been wondering about biopsying this area however he is on Coumadin and with the pain I have put  this off. The major question would be an inflammatory ulcer such as pyoderma. The patient is not aware of how this started. He does however have a wound history on both legs. 6/29; patient presents for 1 week follow-up. He has been using Hydrofera Blue under Kerlix/Coban. He has tolerated this well. He currently denies signs of infection. He also declines debridement today. He he states it is painful and does not want to have this done. 7/6; Patient presents for 1 week follow-up. He has been using Hydrofera Blue under Kerlix/Coban. He denies signs of infection. He declines debridement today. 7/15; patient presents for 1 week follow-up. He has been using Hydrofera Blue under Kerlix/Coban. He denies signs of infection today. He is agreeable to having debridement done today. 7/20; patient presents for 1  week follow-up. He is in good spirits today. He has been using Hydrofera Blue under Kerlix/Coban. He denies signs of infection today. 7/27; patient presents for 1 week follow-up. He is in good spirits today. He has no issues or complaints today. He denies signs of infection. 08/12/2020 upon evaluation today patient appears to be doing better in regard to his wound. He has been tolerating the dressing changes without complication. With that being said he did have arterial studies and does show that he has a TBI on the right of 0.90 and a TBI on the left of 0.76 which is excellent. There is no evidence of arterial compromise at this point. 8/17; patient presents for 1 week follow-up. He has no issues or complaints today. He denies signs of infection. 8/24; patient presents for 1 week follow-up. He has no issues or complaints today. He denies signs of infection. He continues to tolerate the compression wrap well. 8/31; patient presents for 1 week follow-up. He has no issues or complaints today. He denies signs of infection. He is in good spirits today. 9/7; patient presents for 1 week follow-up. He is tolerated the compression wrap well. He has no issues or complaints today. He denies signs of infection. 9/14; patient presents for 1 week follow-up. He has no issues or complaints today. He denies signs of infection. He is in excellent spirits today. 9/21; patient presents for 1 week follow-up. He has no issues or complaints today. Shane Bean, Shane Bean ([id number]) 9/28; patient presents for 1 week follow-up. He has tolerated the compression wrap well. He has no issues or complaints today. 10/5; patient presents for 1 week follow-up. He has no issues or complaints today. 10/12; patient presents for 1 week follow-up. He has no issues or complaints today. He denies signs of infection. Readmission: 04-08-2021 upon evaluation today patient appears to be doing poorly in regard to his right lower extremity. He is  being seen for reevaluation here in the clinic he was last discharged healed in October 2022. Subsequently at that time it was noted in the chart that he was told to be wearing compression socks unfortunately the patient tells me he never remembers being told that. I did discuss this with him today as well as Hosie Poisson discussing it with him today again. Obviously he does know at this point once he is healed he should be wearing compression socks all the time from the time he gets up in the morning to when he goes to sleep at night day in and day out. I think this is the only way that you can to keep things under control. Otherwise his medical history has not changed his arterial study in the past was excellent  and did not appear to show any signs of issue at this point which is great news as well. 04-15-2021 upon evaluation today patient's wound is actually looking much drier than what it was last week. Again I do believe the infection is showing signs of improvement although he is having pain for sore I think this has to do more with the dryness of the wound at this time as opposed to the infection that we were noted in the last week. Fortunately I do not see any evidence of active infection locally or systemically at this time which is great news. 04-22-2021 upon evaluation today patient unfortunately continues to have issues with his leg. This is getting significantly worse despite being on Augmentin. I am actually extremely concerned about worsening infection and especially considering the way the wound looks today and the odor present. 05-04-2021 upon evaluation today patient appears to be doing better with regard to the infection in his leg. He has been tolerating the dressing changes without complication and overall I am extremely pleased with where we stand. There does not appear to be any evidence of active infection locally or systemically which is great news. 05-13-2021 upon evaluation today  patient appears to be doing better in regard to his leg ulcer. He has been tolerating the dressing changes without complication. He does have still significant pain he does have a lot of necrotic tissue which is working its way off and there is no signs of significant infection at this point which is great news. With that being said I do believe that the patient does need to likely continue with the wound care measures at this point with regard to the dressing that we are utilizing. 05-20-2021 upon evaluation today patient's wound is actually showing signs of fairly good granulation and epithelization in a lot of areas he still has some slough noted at this point though a lot of the eschar is softening up I do believe the Xeroform has been helpful in this regard. Fortunately I do not see any evidence of active infection locally or systemically which is great news. No fevers, chills, nausea, vomiting, or diarrhea. I do believe the 3 layer compression wrap has been beneficial. Objective Constitutional Well-nourished and well-hydrated in no acute distress. Vitals Time Taken: 11:38 AM, Height: 70 in, Weight: 277 lbs, BMI: 39.7, Temperature: 98.1 F, Pulse: 67 bpm, Respiratory Rate: 16 breaths/min, Blood Pressure: 126/71 mmHg. Respiratory normal breathing without difficulty. Psychiatric this patient is able to make decisions and demonstrates good insight into disease process. Alert and Oriented x 3. pleasant and cooperative. General Notes: Upon inspection patient's wound bed showed evidence of good granulation and epithelization at this point. Fortunately I do not see any signs of infection locally or systemically which is great news and overall I am extremely pleased with where we stand today. Integumentary (Hair, Skin) Wound #3 status is Open. Original cause of wound was Gradually Appeared. The date acquired was: 02/03/2021. The wound has been in treatment 6 weeks. The wound is located on the  Right,Lateral,Circumferential Lower Leg. The wound measures 14cm length x 14.5cm width x 0.2cm depth; 159.436cm^2 area and 31.887cm^3 volume. There is Fat Layer (Subcutaneous Tissue) exposed. There is no tunneling or undermining noted. There is a medium amount of serosanguineous drainage noted. There is medium (34-66%) red, pink granulation within the wound bed. There is a medium (34-66%) amount of necrotic tissue within the wound bed including Eschar and Adherent Slough. Shane Bean, Shane Bean ([id number]) Assessment Active Problems  ICD-10 Type 2 diabetes mellitus with other skin ulcer Venous insufficiency (chronic) (peripheral) Non-pressure chronic ulcer of other part of left lower leg with fat layer exposed Non-pressure chronic ulcer of other part of right lower leg with fat layer exposed Essential (primary) hypertension Procedures Wound #3 Pre-procedure diagnosis of Wound #3 is a Diabetic Wound/Ulcer of the Lower Extremity located on the Right,Lateral,Circumferential Lower Leg . There was a Three Layer Compression Therapy Procedure by Donnamarie Poag, RN. Post procedure Diagnosis Wound #3: Same as Pre-Procedure Plan Follow-up Appointments: Return Appointment in 1 week. - weekly wrap Nurse Visit as needed Bathing/ Shower/ Hygiene: May shower; gently cleanse wound with antibacterial soap, rinse and pat dry prior to dressing wounds May shower with wound dressing protected with water repellent cover or cast protector. No tub bath. Anesthetic (Use 'Patient Medications' Section for Anesthetic Order Entry): Lidocaine applied to wound bed Edema Control - Lymphedema / Segmental Compressive Device / Other: Elevate, Exercise Daily and Avoid Standing for Long Periods of Time. Elevate legs to the level of the heart and pump ankles as often as possible Elevate leg(s) parallel to the floor when sitting. Additional Orders / Instructions: Follow Nutritious Diet and Increase Protein Intake - monitor blood  sugar WOUND #3: - Lower Leg Wound Laterality: Right, Lateral, Circumferential Cleanser: Soap and Water 1 x Per Week/15 Days Discharge Instructions: Gently cleanse wound with antibacterial soap, rinse and pat dry prior to dressing wounds Cleanser: Wound Cleanser 1 x Per Week/15 Days Discharge Instructions: Wash your hands with soap and water. Remove old dressing, discard into plastic bag and place into trash. Cleanse the wound with Wound Cleanser prior to applying a clean dressing using gauze sponges, not tissues or cotton balls. Do not scrub or use excessive force. Pat dry using gauze sponges, not tissue or cotton balls. Primary Dressing: Xeroform 5x9-HBD (in/in) 1 x Per Week/15 Days Discharge Instructions: Apply Xeroform 5x9-HBD (in/in) as directed Secondary Dressing: ABD Pad 5x9 (in/in) 1 x Per Week/15 Days Discharge Instructions: 2 pads to Cover with ABD pad Compression Wrap: 3-LAYER WRAP - Profore Lite LF 3 Multilayer Compression Bandaging System 1 x Per Week/15 Days Discharge Instructions: Apply 3 multi-layer wrap as prescribed. 1. I am good recommend that we go ahead and continue with the wound care measures as before and the patient is in agreement with that plan. This includes the use of the Xeroform gauze which I do believe is doing well. I also do believe that the ABD pad to cover is doing a good job as far as The Excess Drainage. 2. I Am Also Can Recommend a Continuation of the 3 Layer Compression Wrap. 3. I Did Discuss with Him Proceeding with Sharp Debridement but the Patient Is Not Interested in That Right Now Due To the Fact That He Is Having A Lot Of Pain Even Just Lightly Wiping over the Area Is Extremely Painful He Tells Me. Again I Would Hold off for Now Although I Explained That We May Need to Do This at [facility] in the near Future to Keep Things Moving along. We will see patient back for reevaluation in 1 week here in the clinic. If anything worsens or changes patient  will contact our office for additional recommendations. Shane Bean, Shane Bean ([id number]) Electronic Signature(s) Signed: 05/20/2021 12:14:32 PM By: Worthy Keeler PA-C Entered By: Worthy Keeler on 05/20/2021 12:14:31 Shane Bean ([id number]) -------------------------------------------------------------------------------- SuperBill Details Patient Name: Shane Bean Date of Service: 05/20/2021 Medical Record Number: [id number] Patient Account Number:  [id number] Date of Birth/Sex: 1955/01/12 (66 y.o. M) Treating RN: Donnamarie Poag Primary Care Provider: Reeves Dam Other Clinician: Referring Provider: Reeves Dam Treating Provider/Extender: Jeri Cos Weeks in Treatment: 6 Diagnosis Coding ICD-10 Codes Code Description E11.622 Type 2 diabetes mellitus with other skin ulcer I87.2 Venous insufficiency (chronic) (peripheral) 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 layer exposed L97.812 Non-pressure chronic ulcer of other part of right lower leg with fat layer exposed I10 Essential (primary) hypertension Facility Procedures CPT4 Code: [id number] Description: (Facility Use Only) [phone] - APPLY MULTLAY COMPRS LWR RT LEG Modifier: Quantity: 1 Physician Procedures CPT4 Code: 6568127 Description: 51700 - WC PHYS LEVEL 3 - EST PT Modifier: Quantity: 1 CPT4 Code: Description: ICD-10 Diagnosis Description E11.622 Type 2 diabetes mellitus with other skin ulcer I87.2 Venous insufficiency (chronic) (peripheral) 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 lay L97.812 Non-pressure  chronic ulcer of other part of right lower leg with fat la Modifier: er exposed yer exposed Quantity: Electronic Signature(s) Signed: 05/20/2021 12:15:16 PM By: Worthy Keeler PA-C Entered By: Worthy Keeler on 05/20/2021 12:15:15

## 2021-05-27 ENCOUNTER — Encounter: Payer: Medicare (Managed Care) | Admitting: Physician Assistant

## 2021-05-27 DIAGNOSIS — E11622 Type 2 diabetes mellitus with other skin ulcer: Secondary | ICD-10-CM | POA: Diagnosis not present

## 2021-05-27 NOTE — Progress Notes (Addendum)
JETTSON, CRABLE ([id number]) Visit Report for 05/27/2021 Chief Complaint Document Details Patient Name: Shane, Bean Date of Service: 05/27/2021 1:30 PM Medical Record Number: [id number] Patient Account Number: [id number] Date of Birth/Sex: 1955/02/02 (66 y.o. M) Treating RN: Alycia Rossetti Primary Care Provider: Reeves Dam Other Clinician: Referring Provider: Reeves Dam Treating Provider/Extender: Skipper Cliche in Treatment: 7 Information Obtained from: Patient Chief Complaint Left LE Ulcer Electronic Signature(s) Signed: 05/27/2021 1:36:42 PM By: Worthy Keeler PA-C Entered By: Worthy Keeler on 05/27/2021 13:36:42 Shane Bean ([id number]) -------------------------------------------------------------------------------- HPI Details Patient Name: Shane Bean Date of Service: 05/27/2021 1:30 PM Medical Record Number: [id number] Patient Account Number: [id number] Date of Birth/Sex: July 31, 1955 (65 y.o. M) Treating RN: Alycia Rossetti Primary Care Provider: Reeves Dam Other Clinician: Referring Provider: Reeves Dam Treating Provider/Extender: Skipper Cliche in Treatment: 7 History of Present Illness HPI Description: 05/18/2020 upon evaluation today patient appears for initial evaluation here in clinic concerning issues that he has been having with the wound on his left lateral leg. Fortunately there does not appear to be any signs of obvious an active infection at this time which is great news. With that being said the patient unfortunately is continued to have issues with pain although he tells me it is gotten a lot better. He was on Keflex as well as Bactrim DS which seems to have done a good job there. His most recent hemoglobin A1c was 9.8 that was on 04/14/2020. Subsequently I do feel like that the patient is making progress here. He does have a history of chronic venous insufficiency as well as hypertension. I do not see any need for antibiotics at  this point. 5/25; follow-up of the wound on the left posterior calf. This is completely necrotic on the surface. It does not look infected but it is painful. He is a diabetic but I think there is some suggestion here of chronic venous disease as well. We used Iodoflex under compression last week 6/1; again a completely necrotic surface on this with the wound. We have been using Iodoflex under compression he complains of gnawing pain. He has had wounds previously a lot of this looks like chronic venous disease. 6/8; about two thirds of the surface of this wound is still covered in a black necrotic eschar. I changed him from Iodoflex to [place] last week because of complaints of pain. He actually was seen by her neurologist this weeko Peripheral neuropathy as a cause of the pain and they changed him from Neurontin to Lyrica but he still has not had any relief. He says that most of the pain however is in his heel not in his wound per se. He is a diabetic 6/15. This is a patient with what looks to be a venous wound on the left lateral lower leg. He is also a diabetic. Currently being worked up for diabetic neuropathy. He complains of pain out of proportion to the size of the wound although to be fair the entire area here was eschared. We have been working to get a viable surface. We are using Sorbact 6/22; fairly painful wound on the left posterior calf. I am assuming this is been venous he is also a diabetic. His ABI in our clinic was noncompressible however he has easily palpable pulses on his feet. He complains of a lot of pain in the wound also of the left heel and the upper left calf. Some of this may be neuropathy. It is led me to discontinue  Iodoflex reduce his compression but he still complains of pain in fact he says he could not take a debridement today. When I first saw this it was completely necrotic surface we have got it down to something that looks a reasonably healthy I have been  wondering about biopsying this area however he is on Coumadin and with the pain I have put this off. The major question would be an inflammatory ulcer such as pyoderma. The patient is not aware of how this started. He does however have a wound history on both legs. 6/29; patient presents for 1 week follow-up. He has been using Hydrofera Blue under Kerlix/Coban. He has tolerated this well. He currently denies signs of infection. He also declines debridement today. He he states it is painful and does not want to have this done. 7/6; Patient presents for 1 week follow-up. He has been using Hydrofera Blue under Kerlix/Coban. He denies signs of infection. He declines debridement today. 7/15; patient presents for 1 week follow-up. He has been using Hydrofera Blue under Kerlix/Coban. He denies signs of infection today. He is agreeable to having debridement done today. 7/20; patient presents for 1 week follow-up. He is in good spirits today. He has been using Hydrofera Blue under Kerlix/Coban. He denies signs of infection today. 7/27; patient presents for 1 week follow-up. He is in good spirits today. He has no issues or complaints today. He denies signs of infection. 08/12/2020 upon evaluation today patient appears to be doing better in regard to his wound. He has been tolerating the dressing changes without complication. With that being said he did have arterial studies and does show that he has a TBI on the right of 0.90 and a TBI on the left of 0.76 which is excellent. There is no evidence of arterial compromise at this point. 8/17; patient presents for 1 week follow-up. He has no issues or complaints today. He denies signs of infection. 8/24; patient presents for 1 week follow-up. He has no issues or complaints today. He denies signs of infection. He continues to tolerate the compression wrap well. 8/31; patient presents for 1 week follow-up. He has no issues or complaints today. He denies signs of  infection. He is in good spirits today. 9/7; patient presents for 1 week follow-up. He is tolerated the compression wrap well. He has no issues or complaints today. He denies signs of infection. 9/14; patient presents for 1 week follow-up. He has no issues or complaints today. He denies signs of infection. He is in excellent spirits today. 9/21; patient presents for 1 week follow-up. He has no issues or complaints today. 9/28; patient presents for 1 week follow-up. He has tolerated the compression wrap well. He has no issues or complaints today. 10/5; patient presents for 1 week follow-up. He has no issues or complaints today. 10/12; patient presents for 1 week follow-up. He has no issues or complaints today. He denies signs of infection. Shane, Bean ([id number]) Readmission: 04-08-2021 upon evaluation today patient appears to be doing poorly in regard to his right lower extremity. He is being seen for reevaluation here in the clinic he was last discharged healed in October 2022. Subsequently at that time it was noted in the chart that he was told to be wearing compression socks unfortunately the patient tells me he never remembers being told that. I did discuss this with him today as well as Hosie Poisson discussing it with him today again. Obviously he does know at this point once he  is healed he should be wearing compression socks all the time from the time he gets up in the morning to when he goes to sleep at night day in and day out. I think this is the only way that you can to keep things under control. Otherwise his medical history has not changed his arterial study in the past was excellent and did not appear to show any signs of issue at this point which is great news as well. 04-15-2021 upon evaluation today patient's wound is actually looking much drier than what it was last week. Again I do believe the infection is showing signs of improvement although he is having pain for sore I think  this has to do more with the dryness of the wound at this time as opposed to the infection that we were noted in the last week. Fortunately I do not see any evidence of active infection locally or systemically at this time which is great news. 04-22-2021 upon evaluation today patient unfortunately continues to have issues with his leg. This is getting significantly worse despite being on Augmentin. I am actually extremely concerned about worsening infection and especially considering the way the wound looks today and the odor present. 05-04-2021 upon evaluation today patient appears to be doing better with regard to the infection in his leg. He has been tolerating the dressing changes without complication and overall I am extremely pleased with where we stand. There does not appear to be any evidence of active infection locally or systemically which is great news. 05-13-2021 upon evaluation today patient appears to be doing better in regard to his leg ulcer. He has been tolerating the dressing changes without complication. He does have still significant pain he does have a lot of necrotic tissue which is working its way off and there is no signs of significant infection at this point which is great news. With that being said I do believe that the patient does need to likely continue with the wound care measures at this point with regard to the dressing that we are utilizing. 05-20-2021 upon evaluation today patient's wound is actually showing signs of fairly good granulation and epithelization in a lot of areas he still has some slough noted at this point though a lot of the eschar is softening up I do believe the Xeroform has been helpful in this regard. Fortunately I do not see any evidence of active infection locally or systemically which is great news. No fevers, chills, nausea, vomiting, or diarrhea. I do believe the 3 layer compression wrap has been beneficial. 05-27-2021 upon evaluation today  patient's wound is actually showing signs of significant improvement with a lot of new epithelial growth at this point. Fortunately I do not see any signs of active infection locally or systemically which is great news and overall I think we are headed in the right direction. Electronic Signature(s) Signed: 05/27/2021 5:18:16 PM By: Worthy Keeler PA-C Entered By: Worthy Keeler on 05/27/2021 17:18:15 Shane Bean ([id number]) -------------------------------------------------------------------------------- Physical Exam Details Patient Name: Shane Bean Date of Service: 05/27/2021 1:30 PM Medical Record Number: [id number] Patient Account Number: [id number] Date of Birth/Sex: October 06, 1955 (65 y.o. M) Treating RN: Cornell Barman Primary Care Provider: Reeves Dam Other Clinician: Massie Kluver Referring Provider: Reeves Dam Treating Provider/Extender: Jeri Cos Weeks in Treatment: 7 Constitutional Well-nourished and well-hydrated in no acute distress. Respiratory normal breathing without difficulty. Psychiatric this patient is able to make decisions and demonstrates good insight into disease process.  Alert and Oriented x 3. pleasant and cooperative. Notes Upon inspection patient's wound again is showing signs of some necrotic tissue although we are avoiding debridement due to the pain he has. It is just not bearable for him to undergo sharp debridement at this point. Nonetheless I do think we will slowly autolytic lead to bradycardia in this with the Xeroform and to be honest this is really doing quite well. I think it is a much safer and less painful way to take care of the situation and he is appreciative of this. Electronic Signature(s) Signed: 05/27/2021 5:18:42 PM By: Worthy Keeler PA-C Entered By: Worthy Keeler on 05/27/2021 17:18:42 Shane Bean ([id number]) -------------------------------------------------------------------------------- Physician Orders  Details Patient Name: Shane Bean Date of Service: 05/27/2021 1:30 PM Medical Record Number: [id number] Patient Account Number: [id number] Date of Birth/Sex: 1955/11/01 (65 y.o. M) Treating RN: Cornell Barman Primary Care Provider: Reeves Dam Other Clinician: Massie Kluver Referring Provider: Reeves Dam Treating Provider/Extender: Skipper Cliche in Treatment: 7 Verbal / Phone Orders: No Diagnosis Coding ICD-10 Coding Code Description E11.622 Type 2 diabetes mellitus with other skin ulcer I87.2 Venous insufficiency (chronic) (peripheral) 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 layer exposed L97.812 Non-pressure chronic ulcer of other part of right lower leg with fat layer exposed I10 Essential (primary) hypertension Follow-up Appointments o Return Appointment in 1 week. - weekly wrap o Nurse Visit as needed Bathing/ Shower/ Hygiene o May shower; gently cleanse wound with antibacterial soap, rinse and pat dry prior to dressing wounds o May shower with wound dressing protected with water repellent cover or cast protector. o No tub bath. Anesthetic (Use 'Patient Medications' Section for Anesthetic Order Entry) o Lidocaine applied to wound bed Edema Control - Lymphedema / Segmental Compressive Device / Other o Elevate, Exercise Daily and Avoid Standing for Long Periods of Time. o Elevate legs to the level of the heart and pump ankles as often as possible o Elevate leg(s) parallel to the floor when sitting. Additional Orders / Instructions o Follow Nutritious Diet and Increase Protein Intake - monitor blood sugar Wound Treatment Wound #3 - Lower Leg Wound Laterality: Right, Lateral, Circumferential Cleanser: Soap and Water 1 x Per Week/15 Days Discharge Instructions: Gently cleanse wound with antibacterial soap, rinse and pat dry prior to dressing wounds Cleanser: Wound Cleanser 1 x Per Week/15 Days Discharge  Instructions: Wash your hands with soap and water. Remove old dressing, discard into plastic bag and place into trash. Cleanse the wound with Wound Cleanser prior to applying a clean dressing using gauze sponges, not tissues or cotton balls. Do not scrub or use excessive force. Pat dry using gauze sponges, not tissue or cotton balls. Primary Dressing: Xeroform 5x9-HBD (in/in) 1 x Per Week/15 Days Discharge Instructions: Apply Xeroform 5x9-HBD (in/in) as directed Secondary Dressing: ABD Pad 5x9 (in/in) 1 x Per Week/15 Days Discharge Instructions: 2 pads to Cover with ABD pad Compression Wrap: 3-LAYER WRAP - Profore Lite LF 3 Multilayer Compression Bandaging System 1 x Per Week/15 Days Discharge Instructions: Apply 3 multi-layer wrap as prescribed. Electronic Signature(s) Signed: 05/28/2021 4:54:58 PM By: Worthy Keeler PA-C Signed: 06/01/2021 2:38:02 PM By: Massie Kluver Entered By: Massie Kluver on 05/27/2021 15:04:04 Shane Bean ([id number] Sers, Shane Bean ([id number]) -------------------------------------------------------------------------------- Problem List Details Patient Name: Shane Bean Date of Service: 05/27/2021 1:30 PM Medical Record Number: [id number] Patient Account Number: [id number] Date of Birth/Sex: 04-Jul-1955 (65 y.o. M) Treating RN: Alycia Rossetti Primary  Care Provider: Reeves Dam Other Clinician: Referring Provider: Reeves Dam Treating Provider/Extender: Skipper Cliche in Treatment: 7 Active Problems ICD-10 Encounter Code Description Active Date MDM Diagnosis E11.622 Type 2 diabetes mellitus with other skin ulcer 04/08/2021 No Yes I87.2 Venous insufficiency (chronic) (peripheral) 04/08/2021 No Yes 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 layer 04/08/2021 No Yes exposed L97.812 Non-pressure chronic ulcer of other part of right lower leg with fat layer 04/08/2021 No Yes exposed Shane Bean (primary)  hypertension 04/08/2021 No Yes Inactive Problems Resolved Problems Electronic Signature(s) Signed: 05/27/2021 1:36:36 PM By: Worthy Keeler PA-C Entered By: Worthy Keeler on 05/27/2021 13:36:35 Shane Bean ([id number]) -------------------------------------------------------------------------------- Progress Note Details Patient Name: Shane Bean Date of Service: 05/27/2021 1:30 PM Medical Record Number: [id number] Patient Account Number: [id number] Date of Birth/Sex: [birth year] (65 y.o. M) Treating RN: Cornell Barman Primary Care Provider: Reeves Dam Other Clinician: Massie Kluver Referring Provider: Reeves Dam Treating Provider/Extender: Skipper Cliche in Treatment: 7 Subjective Chief Complaint Information obtained from Patient Left LE Ulcer History of Present Illness (HPI) 05/18/2020 upon evaluation today patient appears for initial evaluation here in clinic concerning issues that he has been having with the wound on his left lateral leg. Fortunately there does not appear to be any signs of obvious an active infection at this time which is great news. With that being said the patient unfortunately is continued to have issues with pain although he tells me it is gotten a lot better. He was on Keflex as well as Bactrim DS which seems to have done a good job there. His most recent hemoglobin A1c was 9.8 that was on 04/14/2020. Subsequently I do feel like that the patient is making progress here. He does have a history of chronic venous insufficiency as well as hypertension. I do not see any need for antibiotics at this point. 5/25; follow-up of the wound on the left posterior calf. This is completely necrotic on the surface. It does not look infected but it is painful. He is a diabetic but I think there is some suggestion here of chronic venous disease as well. We used Iodoflex under compression last week 6/1; again a completely necrotic surface on this with the wound.  We have been using Iodoflex under compression he complains of gnawing pain. He has had wounds previously a lot of this looks like chronic venous disease. 6/8; about two thirds of the surface of this wound is still covered in a black necrotic eschar. I changed him from Iodoflex to [place] last week because of complaints of pain. He actually was seen by her neurologist this weeko Peripheral neuropathy as a cause of the pain and they changed him from Neurontin to Lyrica but he still has not had any relief. He says that most of the pain however is in his heel not in his wound per se. He is a diabetic 6/15. This is a patient with what looks to be a venous wound on the left lateral lower leg. He is also a diabetic. Currently being worked up for diabetic neuropathy. He complains of pain out of proportion to the size of the wound although to be fair the entire area here was eschared. We have been working to get a viable surface. We are using Sorbact 6/22; fairly painful wound on the left posterior calf. I am assuming this is been venous he is also a diabetic. His ABI in our clinic was noncompressible however he has  easily palpable pulses on his feet. He complains of a lot of pain in the wound also of the left heel and the upper left calf. Some of this may be neuropathy. It is led me to discontinue Iodoflex reduce his compression but he still complains of pain in fact he says he could not take a debridement today. When I first saw this it was completely necrotic surface we have got it down to something that looks a reasonably healthy I have been wondering about biopsying this area however he is on Coumadin and with the pain I have put this off. The major question would be an inflammatory ulcer such as pyoderma. The patient is not aware of how this started. He does however have a wound history on both legs. 6/29; patient presents for 1 week follow-up. He has been using Hydrofera Blue under Kerlix/Coban. He has  tolerated this well. He currently denies signs of infection. He also declines debridement today. He he states it is painful and does not want to have this done. 7/6; Patient presents for 1 week follow-up. He has been using Hydrofera Blue under Kerlix/Coban. He denies signs of infection. He declines debridement today. 7/15; patient presents for 1 week follow-up. He has been using Hydrofera Blue under Kerlix/Coban. He denies signs of infection today. He is agreeable to having debridement done today. 7/20; patient presents for 1 week follow-up. He is in good spirits today. He has been using Hydrofera Blue under Kerlix/Coban. He denies signs of infection today. 7/27; patient presents for 1 week follow-up. He is in good spirits today. He has no issues or complaints today. He denies signs of infection. 08/12/2020 upon evaluation today patient appears to be doing better in regard to his wound. He has been tolerating the dressing changes without complication. With that being said he did have arterial studies and does show that he has a TBI on the right of 0.90 and a TBI on the left of 0.76 which is excellent. There is no evidence of arterial compromise at this point. 8/17; patient presents for 1 week follow-up. He has no issues or complaints today. He denies signs of infection. 8/24; patient presents for 1 week follow-up. He has no issues or complaints today. He denies signs of infection. He continues to tolerate the compression wrap well. 8/31; patient presents for 1 week follow-up. He has no issues or complaints today. He denies signs of infection. He is in good spirits today. 9/7; patient presents for 1 week follow-up. He is tolerated the compression wrap well. He has no issues or complaints today. He denies signs of infection. 9/14; patient presents for 1 week follow-up. He has no issues or complaints today. He denies signs of infection. He is in excellent spirits today. 9/21; patient presents for 1  week follow-up. He has no issues or complaints today. Shane, Bean ([id number]) 9/28; patient presents for 1 week follow-up. He has tolerated the compression wrap well. He has no issues or complaints today. 10/5; patient presents for 1 week follow-up. He has no issues or complaints today. 10/12; patient presents for 1 week follow-up. He has no issues or complaints today. He denies signs of infection. Readmission: 04-08-2021 upon evaluation today patient appears to be doing poorly in regard to his right lower extremity. He is being seen for reevaluation here in the clinic he was last discharged healed in October 2022. Subsequently at that time it was noted in the chart that he was told to be wearing compression  socks unfortunately the patient tells me he never remembers being told that. I did discuss this with him today as well as Hosie Poisson discussing it with him today again. Obviously he does know at this point once he is healed he should be wearing compression socks all the time from the time he gets up in the morning to when he goes to sleep at night day in and day out. I think this is the only way that you can to keep things under control. Otherwise his medical history has not changed his arterial study in the past was excellent and did not appear to show any signs of issue at this point which is great news as well. 04-15-2021 upon evaluation today patient's wound is actually looking much drier than what it was last week. Again I do believe the infection is showing signs of improvement although he is having pain for sore I think this has to do more with the dryness of the wound at this time as opposed to the infection that we were noted in the last week. Fortunately I do not see any evidence of active infection locally or systemically at this time which is great news. 04-22-2021 upon evaluation today patient unfortunately continues to have issues with his leg. This is getting significantly worse  despite being on Augmentin. I am actually extremely concerned about worsening infection and especially considering the way the wound looks today and the odor present. 05-04-2021 upon evaluation today patient appears to be doing better with regard to the infection in his leg. He has been tolerating the dressing changes without complication and overall I am extremely pleased with where we stand. There does not appear to be any evidence of active infection locally or systemically which is great news. 05-13-2021 upon evaluation today patient appears to be doing better in regard to his leg ulcer. He has been tolerating the dressing changes without complication. He does have still significant pain he does have a lot of necrotic tissue which is working its way off and there is no signs of significant infection at this point which is great news. With that being said I do believe that the patient does need to likely continue with the wound care measures at this point with regard to the dressing that we are utilizing. 05-20-2021 upon evaluation today patient's wound is actually showing signs of fairly good granulation and epithelization in a lot of areas he still has some slough noted at this point though a lot of the eschar is softening up I do believe the Xeroform has been helpful in this regard. Fortunately I do not see any evidence of active infection locally or systemically which is great news. No fevers, chills, nausea, vomiting, or diarrhea. I do believe the 3 layer compression wrap has been beneficial. 05-27-2021 upon evaluation today patient's wound is actually showing signs of significant improvement with a lot of new epithelial growth at this point. Fortunately I do not see any signs of active infection locally or systemically which is great news and overall I think we are headed in the right direction. Objective Constitutional Well-nourished and well-hydrated in no acute distress. Vitals Time Taken:  2:03 PM, Height: 70 in, Weight: 277 lbs, BMI: 39.7, Temperature: 98.1 F, Pulse: 66 bpm, Respiratory Rate: 16 breaths/min, Blood Pressure: 134/76 mmHg. Respiratory normal breathing without difficulty. Psychiatric this patient is able to make decisions and demonstrates good insight into disease process. Alert and Oriented x 3. pleasant and cooperative. General Notes: Upon inspection  patient's wound again is showing signs of some necrotic tissue although we are avoiding debridement due to the pain he has. It is just not bearable for him to undergo sharp debridement at this point. Nonetheless I do think we will slowly autolytic lead to bradycardia in this with the Xeroform and to be honest this is really doing quite well. I think it is a much safer and less painful way to take care of the situation and he is appreciative of this. Integumentary (Hair, Skin) Wound #3 status is Open. Original cause of wound was Gradually Appeared. The date acquired was: 02/03/2021. The wound has been in treatment 7 weeks. The wound is located on the Right,Lateral,Circumferential Lower Leg. The wound measures 13.7cm length x 7.8cm width x 0.2cm depth; 83.928cm^2 area and 16.786cm^3 volume. There is Fat Layer (Subcutaneous Tissue) exposed. There is a medium amount of serosanguineous drainage noted. There is medium (34-66%) red, pink granulation within the wound bed. There is a medium (34-66%) amount of necrotic tissue Shane Bean, Shane V. ([id number]) within the wound bed including Eschar and Adherent Slough. Assessment Active Problems ICD-10 Type 2 diabetes mellitus with other skin ulcer Venous insufficiency (chronic) (peripheral) Non-pressure chronic ulcer of other part of left lower leg with fat layer exposed Non-pressure chronic ulcer of other part of right lower leg with fat layer exposed Essential (primary) hypertension Procedures Wound #3 Pre-procedure diagnosis of Wound #3 is a Diabetic Wound/Ulcer of the  Lower Extremity located on the Right,Lateral,Circumferential Lower Leg . There was a Three Layer Compression Therapy Procedure with a pre-treatment ABI of 0.9 by Massie Kluver. Post procedure Diagnosis Wound #3: Same as Pre-Procedure Plan Follow-up Appointments: Return Appointment in 1 week. - weekly wrap Nurse Visit as needed Bathing/ Shower/ Hygiene: May shower; gently cleanse wound with antibacterial soap, rinse and pat dry prior to dressing wounds May shower with wound dressing protected with water repellent cover or cast protector. No tub bath. Anesthetic (Use 'Patient Medications' Section for Anesthetic Order Entry): Lidocaine applied to wound bed Edema Control - Lymphedema / Segmental Compressive Device / Other: Elevate, Exercise Daily and Avoid Standing for Long Periods of Time. Elevate legs to the level of the heart and pump ankles as often as possible Elevate leg(s) parallel to the floor when sitting. Additional Orders / Instructions: Follow Nutritious Diet and Increase Protein Intake - monitor blood sugar WOUND #3: - Lower Leg Wound Laterality: Right, Lateral, Circumferential Cleanser: Soap and Water 1 x Per Week/15 Days Discharge Instructions: Gently cleanse wound with antibacterial soap, rinse and pat dry prior to dressing wounds Cleanser: Wound Cleanser 1 x Per Week/15 Days Discharge Instructions: Wash your hands with soap and water. Remove old dressing, discard into plastic bag and place into trash. Cleanse the wound with Wound Cleanser prior to applying a clean dressing using gauze sponges, not tissues or cotton balls. Do not scrub or use excessive force. Pat dry using gauze sponges, not tissue or cotton balls. Primary Dressing: Xeroform 5x9-HBD (in/in) 1 x Per Week/15 Days Discharge Instructions: Apply Xeroform 5x9-HBD (in/in) as directed Secondary Dressing: ABD Pad 5x9 (in/in) 1 x Per Week/15 Days Discharge Instructions: 2 pads to Cover with ABD pad Compression Wrap:  3-LAYER WRAP - Profore Lite LF 3 Multilayer Compression Bandaging System 1 x Per Week/15 Days Discharge Instructions: Apply 3 multi-layer wrap as prescribed. 1. I am recommend currently that we go and continue with wound care measures as before and the patient is in agreement with plan this includes a Xeroform gauze applied  to the wound bed. 2. Malignant continue to cover this with an ABD pad followed by a 3 layer compression wrap which is also doing quite well. We will see patient back for reevaluation in 1 week here in the clinic. If anything worsens or changes patient will contact our office for additional recommendations. Shane Bean, Shane Bean ([id number]) Electronic Signature(s) Signed: 05/27/2021 5:19:00 PM By: Worthy Keeler PA-C Entered By: Worthy Keeler on 05/27/2021 17:19:00 Shane Bean ([id number]) -------------------------------------------------------------------------------- SuperBill Details Patient Name: Shane Bean Date of Service: 05/27/2021 Medical Record Number: [id number] Patient Account Number: [id number] Date of Birth/Sex: 16-Jul-1955 (65 y.o. M) Treating RN: Cornell Barman Primary Care Provider: Reeves Dam Other Clinician: Massie Kluver Referring Provider: Reeves Dam Treating Provider/Extender: Jeri Cos Weeks in Treatment: 7 Diagnosis Coding ICD-10 Codes Code Description E11.622 Type 2 diabetes mellitus with other skin ulcer I87.2 Venous insufficiency (chronic) (peripheral) 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 layer exposed L97.812 Non-pressure chronic ulcer of other part of right lower leg with fat layer exposed Shane Bean (primary) hypertension Facility Procedures CPT4 Code: 26712458 Description: (Facility Use Only) [phone] - APPLY MULTLAY COMPRS LWR RT LEG Modifier: Quantity: 1 Physician Procedures CPT4 Code: 2505397 Description: 67341 - WC PHYS LEVEL 3 - EST PT Modifier: Quantity: 1 CPT4  Code: Description: ICD-10 Diagnosis Description E11.622 Type 2 diabetes mellitus with other skin ulcer I87.2 Venous insufficiency (chronic) (peripheral) 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 lay L97.812 Non-pressure  chronic ulcer of other part of right lower leg with fat la Modifier: er exposed yer exposed Quantity: Electronic Signature(s) Signed: 05/27/2021 5:19:52 PM By: Worthy Keeler PA-C Entered By: Worthy Keeler on 05/27/2021 17:19:52

## 2021-05-27 NOTE — Progress Notes (Addendum)
Shane Bean, Shane Bean ([id number]) Visit Report for 05/27/2021 Arrival Information Details Patient Name: Shane Bean, Shane Bean Date of Service: 05/27/2021 1:30 PM Medical Record Number: [id number] Patient Account Number: [id number] Date of Birth/Sex: 09-10-1955 (66 y.o. M) Treating RN: Alycia Rossetti Primary Care Natasha Paulson: Reeves Dam Other Clinician: Referring Krisi Azua: Reeves Dam Treating Jalesia Loudenslager/Extender: Skipper Cliche in Treatment: 7 Visit Information History Since Last Visit All ordered tests and consults were completed: No Patient Arrived: Ambulatory Added or deleted any medications: No Arrival Time: 14:02 Any new allergies or adverse reactions: No Transfer Assistance: None Had a fall or experienced change in No Patient Requires Transmission-Based No activities of daily living that may affect Precautions: risk of falls: Patient Has Alerts: Yes Signs or symptoms of abuse/neglect since last visito No Patient Alerts: Patient on Blood Hospitalized since last visit: No Thinner ABI R [place] TBI .90 7/22 Pain Present Now: No ABI L 1.46 TBI .76 7/22 Electronic Signature(s) Signed: 06/01/2021 2:38:02 PM By: Massie Kluver Entered By: Massie Kluver on 05/27/2021 14:02:52 Shane Bean ([id number]) -------------------------------------------------------------------------------- Clinic Level of Care Assessment Details Patient Name: Shane Bean Date of Service: 05/27/2021 1:30 PM Medical Record Number: [id number] Patient Account Number: [id number] Date of Birth/Sex: 09/13/1955 (65 y.o. M) Treating RN: Cornell Barman Primary Care Dysen Edmondson: Reeves Dam Other Clinician: Massie Kluver Referring Iyonnah Ferrante: Reeves Dam Treating Mindi Akerson/Extender: Skipper Cliche in Treatment: 7 Clinic Level of Care Assessment Items TOOL 1 Quantity Score []  - Use when EandM and Procedure is performed on INITIAL visit 0 ASSESSMENTS - Nursing Assessment / Reassessment []  - General Physical  Exam (combine w/ comprehensive assessment (listed just below) when performed on new 0 pt. evals) []  - 0 Comprehensive Assessment (HX, ROS, Risk Assessments, Wounds Hx, etc.) ASSESSMENTS - Wound and Skin Assessment / Reassessment []  - Dermatologic / Skin Assessment (not related to wound area) 0 ASSESSMENTS - Ostomy and/or Continence Assessment and Care []  - Incontinence Assessment and Management 0 []  - 0 Ostomy Care Assessment and Management (repouching, etc.) PROCESS - Coordination of Care []  - Simple Patient / Family Education for ongoing care 0 []  - 0 Complex (extensive) Patient / Family Education for ongoing care []  - 0 Staff obtains Programmer, systems, Records, Test Results / Process Orders []  - 0 Staff telephones HHA, Nursing Homes / Clarify orders / etc []  - 0 Routine Transfer to another Facility (non-emergent condition) []  - 0 Routine Hospital Admission (non-emergent condition) []  - 0 New Admissions / Biomedical engineer / Ordering NPWT, Apligraf, etc. []  - 0 Emergency Hospital Admission (emergent condition) PROCESS - Special Needs []  - Pediatric / Minor Patient Management 0 []  - 0 Isolation Patient Management []  - 0 Hearing / Language / Visual special needs []  - 0 Assessment of Community assistance (transportation, D/C planning, etc.) []  - 0 Additional assistance / Altered mentation []  - 0 Support Surface(s) Assessment (bed, cushion, seat, etc.) INTERVENTIONS - Miscellaneous []  - External ear exam 0 []  - 0 Patient Transfer (multiple staff / Civil Service fast streamer / Similar devices) []  - 0 Simple Staple / Suture removal (25 or less) []  - 0 Complex Staple / Suture removal (26 or more) []  - 0 Hypo/Hyperglycemic Management (do not check if billed separately) []  - 0 Ankle / Brachial Index (ABI) - do not check if billed separately Has the patient been seen at the hospital within the last three years: Yes Total Score: 0 Level Of Care: ____ Shane Bean  ([id number]) Electronic Signature(s) Signed: 06/01/2021 2:38:02 PM By: Massie Kluver Entered By: Clifton James,  Angie on 05/27/2021 15:04:24 Shane Bean, Shane Bean ([id number]) -------------------------------------------------------------------------------- Compression Therapy Details Patient Name: Shane Bean, Shane Bean Date of Service: 05/27/2021 1:30 PM Medical Record Number: [id number] Patient Account Number: [id number] Date of Birth/Sex: [birth year] (65 y.o. M) Treating RN: Cornell Barman Primary Care Lurlean Kernen: Reeves Dam Other Clinician: Massie Kluver Referring Marketta Valadez: Reeves Dam Treating Cannon Quinton/Extender: Jeri Cos Weeks in Treatment: 7 Compression Therapy Performed for Wound Assessment: Wound #3 Right,Lateral,Circumferential Lower Leg Performed By: Clinician Clifton James, Angie, Compression Type: Three Layer Pre Treatment ABI: 0.9 Post Procedure Diagnosis Same as Pre-procedure Electronic Signature(s) Signed: 06/01/2021 2:38:02 PM By: Massie Kluver Entered By: Massie Kluver on 05/27/2021 15:03:19 Shane Bean ([id number]) -------------------------------------------------------------------------------- Encounter Discharge Information Details Patient Name: Shane Bean Date of Service: 05/27/2021 1:30 PM Medical Record Number: [id number] Patient Account Number: [id number] Date of Birth/Sex: 1955-03-22 (65 y.o. M) Treating RN: Cornell Barman Primary Care Falana Clagg: Reeves Dam Other Clinician: Massie Kluver Referring Elmyra Banwart: Reeves Dam Treating Aithana Kushner/Extender: Skipper Cliche in Treatment: 7 Encounter Discharge Information Items Discharge Condition: Stable Ambulatory Status: Ambulatory Discharge Destination: Home Transportation: Private Auto Accompanied By: self Schedule Follow-up Appointment: Yes Clinical Summary of Care: Electronic Signature(s) Signed: 06/01/2021 2:38:02 PM By: Massie Kluver Entered By: Massie Kluver on 05/27/2021 15:06:37 Shane Bean ([id number]) -------------------------------------------------------------------------------- Lower Extremity Assessment Details Patient Name: Shane Bean Date of Service: 05/27/2021 1:30 PM Medical Record Number: [id number] Patient Account Number: [id number] Date of Birth/Sex: [birth year] (65 y.o. M) Treating RN: Alycia Rossetti Primary Care Dandre Sisler: Reeves Dam Other Clinician: Referring Gareld Obrecht: Reeves Dam Treating Jaidin Richison/Extender: Jeri Cos Weeks in Treatment: 7 Edema Assessment Assessed: [Left: No] [Right: Yes] Edema: [Left: Ye] [Right: s] Calf Left: Right: Point of Measurement: 31 cm From Medial Instep 45.6 cm Ankle Left: Right: Point of Measurement: 10 cm From Medial Instep 27.9 cm Vascular Assessment Pulses: Dorsalis Pedis Palpable: [Right:Yes] Electronic Signature(s) Signed: 05/27/2021 3:31:27 PM By: Alycia Rossetti Signed: 06/01/2021 2:38:02 PM By: Massie Kluver Entered By: Massie Kluver on 05/27/2021 14:23:43 Shane Bean ([id number]) -------------------------------------------------------------------------------- Multi Wound Chart Details Patient Name: Shane Bean Date of Service: 05/27/2021 1:30 PM Medical Record Number: [id number] Patient Account Number: [id number] Date of Birth/Sex: July 08, 1955 (65 y.o. M) Treating RN: Alycia Rossetti Primary Care Joshalyn Ancheta: Reeves Dam Other Clinician: Referring Jahid Weida: Reeves Dam Treating Norvella Loscalzo/Extender: Jeri Cos Weeks in Treatment: 7 Vital Signs Height(in): 70 Pulse(bpm): 66 Weight(lbs): 277 Blood Pressure(mmHg): 134/76 Body Mass Index(BMI): 39.7 Temperature(F): 98.1 Respiratory Rate(breaths/min): 16 Photos: [N/A:N/A] Wound Location: Right, Lateral, Circumferential N/A N/A Lower Leg Wounding Event: Gradually Appeared N/A N/A Primary Etiology: Diabetic Wound/Ulcer of the Lower N/A N/A Extremity Comorbid History: Hypertension, Type II Diabetes, N/A N/A Neuropathy Date Acquired:  02/03/2021 N/A N/A Weeks of Treatment: 7 N/A N/A Wound Status: Open N/A N/A Wound Recurrence: No N/A N/A Measurements L x W x D (cm) 13.7x7.8x0.2 N/A N/A Area (cm) : 83.928 N/A N/A Volume (cm) : 16.786 N/A N/A % Reduction in Area: -17.40% N/A N/A % Reduction in Volume: -134.90% N/A N/A Classification: Grade 2 N/A N/A Exudate Amount: Medium N/A N/A Exudate Type: Serosanguineous N/A N/A Exudate Color: red, brown N/A N/A Granulation Amount: Medium (34-66%) N/A N/A Granulation Quality: Red, Pink N/A N/A Necrotic Amount: Medium (34-66%) N/A N/A Necrotic Tissue: Eschar, Adherent Slough N/A N/A Exposed Structures: Fat Layer (Subcutaneous Tissue): N/A N/A Yes Fascia: No Tendon: No Muscle: No Joint: No Bone: No Epithelialization: None N/A N/A Treatment Notes Electronic Signature(s) Signed: 06/01/2021 2:38:02 PM By: Massie Kluver Entered By: Massie Kluver on 05/27/2021 15:01:39 Shane Bean, Shane  Clayton Bean ([id number] Bean ([id number]) -------------------------------------------------------------------------------- [place] Details Patient Name: Shane Bean, Shane Bean Date of Service: 05/27/2021 1:30 PM Medical Record Number: [id number] Patient Account Number: [id number] Date of Birth/Sex: Mar 19, 1955 (65 y.o. M) Treating RN: Alycia Rossetti Primary Care Li Bobo: Reeves Dam Other Clinician: Referring Philena Obey: Reeves Dam Treating Quame Spratlin/Extender: Skipper Cliche in Treatment: 7 Active Inactive Necrotic Tissue Nursing Diagnoses: Impaired tissue integrity related to necrotic/devitalized tissue Knowledge deficit related to management of necrotic/devitalized tissue Goals: Necrotic/devitalized tissue will be minimized in the wound bed Date Initiated: 04/15/2021 Target Resolution Date: 04/15/2021 Goal Status: Active Patient/caregiver will verbalize understanding of reason and process for debridement of necrotic tissue Date Initiated: 04/15/2021 Date  Inactivated: 05/20/2021 Target Resolution Date: 04/15/2021 Goal Status: Met Interventions: Assess patient pain level pre-, during and post procedure and prior to discharge Provide education on necrotic tissue and debridement process Notes: Wound/Skin Impairment Nursing Diagnoses: Knowledge deficit related to ulceration/compromised skin integrity Goals: Patient/caregiver will verbalize understanding of skin care regimen Date Initiated: 04/08/2021 Date Inactivated: 05/04/2021 Target Resolution Date: 05/08/2021 Goal Status: Met Ulcer/skin breakdown will have a volume reduction of 30% by week 4 Date Initiated: 04/08/2021 Target Resolution Date: 06/08/2021 Goal Status: Active Ulcer/skin breakdown will have a volume reduction of 50% by week 8 Date Initiated: 04/08/2021 Target Resolution Date: 07/08/2021 Goal Status: Active Ulcer/skin breakdown will have a volume reduction of 80% by week 12 Date Initiated: 04/08/2021 Target Resolution Date: 08/08/2021 Goal Status: Active Ulcer/skin breakdown will heal within 14 weeks Date Initiated: 04/08/2021 Target Resolution Date: 09/08/2021 Goal Status: Active Interventions: Assess patient/caregiver ability to obtain necessary supplies Assess patient/caregiver ability to perform ulcer/skin care regimen upon admission and as needed Assess ulceration(s) every visit Notes: Electronic Signature(s) Signed: 05/27/2021 3:31:27 PM By: Alycia Rossetti Signed: 06/01/2021 2:38:02 PM By: Shane Bean ([id number]) Entered By: Massie Kluver on 05/27/2021 Shane Bean, Shane V. ([id number]) -------------------------------------------------------------------------------- Pain Assessment Details Patient Name: Shane Bean Date of Service: 05/27/2021 1:30 PM Medical Record Number: [id number] Patient Account Number: [id number] Date of Birth/Sex: 01/24/1955 (65 y.o. M) Treating RN: Alycia Rossetti Primary Care Duel Conrad: Reeves Dam Other  Clinician: Referring Erroll Wilbourne: Reeves Dam Treating Tearra Ouk/Extender: Jeri Cos Weeks in Treatment: 7 Active Problems Location of Pain Severity and Description of Pain Patient Has Paino No Site Locations Pain Management and Medication Current Pain Management: Electronic Signature(s) Signed: 05/27/2021 3:31:27 PM By: Alycia Rossetti Signed: 06/01/2021 2:38:02 PM By: Massie Kluver Entered By: Massie Kluver on 05/27/2021 14:04:05 Shane Bean ([id number]) -------------------------------------------------------------------------------- Patient/Caregiver Education Details Patient Name: Shane Bean Date of Service: 05/27/2021 1:30 PM Medical Record Number: [id number] Patient Account Number: [id number] Date of Birth/Gender: 1955-04-24 (65 y.o. M) Treating RN: Cornell Barman Primary Care Physician: Reeves Dam Other Clinician: Massie Kluver Referring Physician: Reeves Dam Treating Physician/Extender: Skipper Cliche in Treatment: 7 Education Assessment Education Provided To: Patient Education Topics Provided Wound Debridement: Handouts: Other: continue wound treatment as directed Electronic Signature(s) Signed: 06/01/2021 2:38:02 PM By: Massie Kluver Entered By: Massie Kluver on 05/27/2021 15:05:55 Shane Bean ([id number]) -------------------------------------------------------------------------------- Wound Assessment Details Patient Name: Shane Bean Date of Service: 05/27/2021 1:30 PM Medical Record Number: [id number] Patient Account Number: [id number] Date of Birth/Sex: 29-Aug-1955 (65 y.o. M) Treating RN: Alycia Rossetti Primary Care Rochell Mabie: Reeves Dam Other Clinician: Referring Jeren Dufrane: Reeves Dam Treating Conleigh Heinlein/Extender: Jeri Cos Weeks in Treatment: 7 Wound Status Wound Number: 3 Primary Etiology: Diabetic Wound/Ulcer of the Lower Extremity Wound Location: Right, Lateral, Circumferential Lower Leg Wound Status:  Open Wounding Event:  Gradually Appeared Comorbid History: Hypertension, Type II Diabetes, Neuropathy Date Acquired: 02/03/2021 Weeks Of Treatment: 7 Clustered Wound: No Photos Wound Measurements Length: (cm) 13.7 Width: (cm) 7.8 Depth: (cm) 0.2 Area: (cm) 83.928 Volume: (cm) 16.786 % Reduction in Area: -17.4% % Reduction in Volume: -134.9% Epithelialization: None Wound Description Classification: Grade 2 Exudate Amount: Medium Exudate Type: Serosanguineous Exudate Color: red, brown Foul Odor After Cleansing: No Slough/Fibrino Yes Wound Bed Granulation Amount: Medium (34-66%) Exposed Structure Granulation Quality: Red, Pink Fascia Exposed: No Necrotic Amount: Medium (34-66%) Fat Layer (Subcutaneous Tissue) Exposed: Yes Necrotic Quality: Eschar, Adherent Slough Tendon Exposed: No Muscle Exposed: No Joint Exposed: No Bone Exposed: No Treatment Notes Wound #3 (Lower Leg) Wound Laterality: Right, Lateral, Circumferential Cleanser Soap and Water Discharge Instruction: Gently cleanse wound with antibacterial soap, rinse and pat dry prior to dressing wounds Wound Cleanser Discharge Instruction: Wash your hands with soap and water. Remove old dressing, discard into plastic bag and place into trash. Cleanse the wound with Wound Cleanser prior to applying a clean dressing using gauze sponges, not tissues or cotton balls. Do not Coriz, Nollan V. ([id number]) scrub or use excessive force. Pat dry using gauze sponges, not tissue or cotton balls. Peri-Wound Care Topical Primary Dressing Xeroform 5x9-HBD (in/in) Discharge Instruction: Apply Xeroform 5x9-HBD (in/in) as directed Secondary Dressing ABD Pad 5x9 (in/in) Discharge Instruction: 2 pads to Cover with ABD pad Secured With Compression Wrap 3-LAYER WRAP - Profore Lite LF 3 Multilayer Compression Bandaging System Discharge Instruction: Apply 3 multi-layer wrap as prescribed. Compression Stockings Add-Ons Electronic  Signature(s) Signed: 05/27/2021 3:31:27 PM By: Alycia Rossetti Signed: 06/01/2021 2:38:02 PM By: Massie Kluver Entered By: Massie Kluver on 05/27/2021 14:23:30 Shane Bean ([id number]) -------------------------------------------------------------------------------- Vitals Details Patient Name: Shane Bean Date of Service: 05/27/2021 1:30 PM Medical Record Number: [id number] Patient Account Number: [id number] Date of Birth/Sex: 1955/09/08 (65 y.o. M) Treating RN: Alycia Rossetti Primary Care Lilie Vezina: Reeves Dam Other Clinician: Referring Ash Mcelwain: Reeves Dam Treating Bronwen Pendergraft/Extender: Jeri Cos Weeks in Treatment: 7 Vital Signs Time Taken: 14:03 Temperature (F): 98.1 Height (in): 70 Pulse (bpm): 66 Weight (lbs): 277 Respiratory Rate (breaths/min): 16 Body Mass Index (BMI): 39.7 Blood Pressure (mmHg): 134/76 Reference Range: 80 - 120 mg / dl Electronic Signature(s) Signed: 06/01/2021 2:38:02 PM By: Massie Kluver Entered By: Massie Kluver on 05/27/2021 14:03:51

## 2021-06-03 ENCOUNTER — Encounter: Payer: Medicare (Managed Care) | Attending: Physician Assistant | Admitting: Physician Assistant

## 2021-06-03 DIAGNOSIS — E1142 Type 2 diabetes mellitus with diabetic polyneuropathy: Secondary | ICD-10-CM | POA: Insufficient documentation

## 2021-06-03 DIAGNOSIS — E11622 Type 2 diabetes mellitus with other skin ulcer: Secondary | ICD-10-CM | POA: Diagnosis present

## 2021-06-03 DIAGNOSIS — L97822 Non-pressure chronic ulcer of other part of left lower leg with fat layer exposed: Secondary | ICD-10-CM | POA: Insufficient documentation

## 2021-06-03 DIAGNOSIS — I872 Venous insufficiency (chronic) (peripheral): Secondary | ICD-10-CM | POA: Insufficient documentation

## 2021-06-03 DIAGNOSIS — L97812 Non-pressure chronic ulcer of other part of right lower leg with fat layer exposed: Secondary | ICD-10-CM | POA: Diagnosis not present

## 2021-06-03 NOTE — Progress Notes (Signed)
YISROEL, MULLENDORE ([id number]) Visit Report for 06/03/2021 Arrival Information Details Patient Name: Shane Bean, Shane Bean Date of Service: 06/03/2021 1:00 PM Medical Record Number: [id number] Patient Account Number: [id number] Date of Birth/Sex: 1955/04/10 (65 y.o. M) Treating RN: Levora Dredge Primary Care Aitan Rossbach: Reeves Dam Other Clinician: Referring Omolara Carol: Reeves Dam Treating Shantaya Bluestone/Extender: Skipper Cliche in Treatment: 8 Visit Information History Since Last Visit Added or deleted any medications: No Patient Arrived: Ambulatory Any new allergies or adverse reactions: No Arrival Time: 13:06 Had a fall or experienced change in No Accompanied By: self activities of daily living that may affect Transfer Assistance: None risk of falls: Patient Identification Verified: Yes Hospitalized since last visit: No Secondary Verification Process Completed: Yes Has Dressing in Place as Prescribed: Yes Patient Requires Transmission-Based No Has Compression in Place as Prescribed: Yes Precautions: Pain Present Now: No Patient Has Alerts: Yes Patient Alerts: Patient on Blood Thinner ABI R [employer] [place] TBI .90 7/22 ABI L 1.46 TBI .78 7/22 Electronic Signature(s) Signed: 06/03/2021 3:50:43 PM By: Levora Dredge Entered By: Levora Dredge on 06/03/2021 13:06:23 Shane Bean ([id number]) -------------------------------------------------------------------------------- Clinic Level of Care Assessment Details Patient Name: Shane Bean Date of Service: 06/03/2021 1:00 PM Medical Record Number: [id number] Patient Account Number: [id number] Date of Birth/Sex: May 15, 1955 (65 y.o. M) Treating RN: Levora Dredge Primary Care Henrry Feil: Reeves Dam Other Clinician: Referring Kaylanie Capili: Reeves Dam Treating Saralee Bolick/Extender: Skipper Cliche in Treatment: 8 Clinic Level of Care Assessment Items TOOL 1 Quantity Score _0  - Use when EandM and Procedure is performed on INITIAL  visit 0 ASSESSMENTS - Nursing Assessment / Reassessment _1  - General Physical Exam (combine w/ comprehensive assessment (listed just below) when performed on new 0 pt. evals) _2  - 0 Comprehensive Assessment (HX, ROS, Risk Assessments, Wounds Hx, etc.) ASSESSMENTS - Wound and Skin Assessment / Reassessment _3  - Dermatologic / Skin Assessment (not related to wound area) 0 ASSESSMENTS - Ostomy and/or Continence Assessment and Care _4  - Incontinence Assessment and Management 0 _5  - 0 Ostomy Care Assessment and Management (repouching, etc.) PROCESS - Coordination of Care _6  - Simple Patient / Family Education for ongoing care 0 _7  - 0 Complex (extensive) Patient / Family Education for ongoing care _8  - 0 Staff obtains Programmer, systems, Records, Test Results / Process Orders _9  - 0 Staff telephones HHA, Nursing Homes / Clarify orders / etc _10  - 0 Routine Transfer to another Facility (non-emergent condition) _11  - 0 Routine Hospital Admission (non-emergent condition) _12  - 0 New Admissions / Biomedical engineer / Ordering NPWT, Apligraf, etc. _13  - 0 Emergency Hospital Admission (emergent condition) PROCESS - Special Needs _14  - Pediatric / Minor Patient Management 0 _15  - 0 Isolation Patient Management _16  - 0 Hearing / Language / Visual special needs _17  - 0 Assessment of Community assistance (transportation, D/C planning, etc.) _18  - 0 Additional assistance / Altered mentation _19  - 0 Support Surface(s) Assessment (bed, cushion, seat, etc.) INTERVENTIONS - Miscellaneous _20  - External ear exam 0 _21  - 0 Patient Transfer (multiple staff / Civil Service fast streamer / Similar devices) _22  - 0 Simple Staple / Suture removal (25 or less) _23  - 0 Complex Staple / Suture removal (26 or more) _24  - 0 Hypo/Hyperglycemic Management (do not check if billed separately) _25  - 0 Ankle / Brachial Index (ABI) - do not check if billed separately Has the patient been seen at the hospital within the last three years:  Yes Total Score: 0 Level Of Care: ____ Shane Bean ([id number]) Electronic Signature(s) Signed: 06/03/2021 3:50:43  PM By: Levora Dredge Entered By: Levora Dredge on 06/03/2021 14:24:10 Shane Bean ([id number]) -------------------------------------------------------------------------------- Encounter Discharge Information Details Patient Name: Shane Bean Date of Service: 06/03/2021 1:00 PM Medical Record Number: [id number] Patient Account Number: [id number] Date of Birth/Sex: June 14, 1955 (65 y.o. M) Treating RN: Levora Dredge Primary Care Delford Wingert: Reeves Dam Other Clinician: Referring Marybelle Giraldo: Reeves Dam Treating Jaxsun Ciampi/Extender: Skipper Cliche in Treatment: 8 Encounter Discharge Information Items Post Procedure Vitals Discharge Condition: Stable Temperature (F): 98.2 Ambulatory Status: Ambulatory Pulse (bpm): 72 Discharge Destination: Home Respiratory Rate (breaths/min): 18 Transportation: Private Auto Blood Pressure (mmHg): 126/83 Accompanied By: self Schedule Follow-up Appointment: Yes Clinical Summary of Care: Electronic Signature(s) Signed: 06/03/2021 2:25:20 PM By: Levora Dredge Entered By: Levora Dredge on 06/03/2021 14:25:20 Shane Bean ([id number]) -------------------------------------------------------------------------------- Lower Extremity Assessment Details Patient Name: Shane Bean Date of Service: 06/03/2021 1:00 PM Medical Record Number: [id number] Patient Account Number: [id number] Date of Birth/Sex: 18-Aug-1955 (65 y.o. M) Treating RN: Levora Dredge Primary Care Lashayla Armes: Reeves Dam Other Clinician: Referring Jaidee Stipe: Reeves Dam Treating Christan Defranco/Extender: Jeri Cos Weeks in Treatment: 8 Edema Assessment Assessed: [Left: No] [Right: No] Edema: [Left: Ye] [Right: s] Calf Left: Right: Point of Measurement: 31 cm From Medial Instep 42 cm Ankle Left: Right: Point of Measurement: 10 cm From  Medial Instep 26 cm Vascular Assessment Pulses: Dorsalis Pedis Palpable: [Right:Yes] Electronic Signature(s) Signed: 06/03/2021 3:50:43 PM By: Levora Dredge Entered By: Levora Dredge on 06/03/2021 13:19:47 Shane Bean ([id number]) -------------------------------------------------------------------------------- Multi Wound Chart Details Patient Name: Shane Bean Date of Service: 06/03/2021 1:00 PM Medical Record Number: [id number] Patient Account Number: [id number] Date of Birth/Sex: [birth year] (65 y.o. M) Treating RN: Levora Dredge Primary Care Fordyce Lepak: Reeves Dam Other Clinician: Referring Charish Schroepfer: Reeves Dam Treating Barrie Wale/Extender: Jeri Cos Weeks in Treatment: 8 Vital Signs Height(in): 70 Pulse(bpm): 172 Weight(lbs): 709 Blood Pressure(mmHg): 126/83 Body Mass Index(BMI): 39.7 Temperature(F): 98.2 Respiratory Rate(breaths/min): 18 Photos: [N/A:N/A] Wound Location: Right, Lateral, Circumferential N/A N/A Lower Leg Wounding Event: Gradually Appeared N/A N/A Primary Etiology: Diabetic Wound/Ulcer of the Lower N/A N/A Extremity Comorbid History: Hypertension, Type II Diabetes, N/A N/A Neuropathy Date Acquired: 02/03/2021 N/A N/A Weeks of Treatment: 8 N/A N/A Wound Status: Open N/A N/A Wound Recurrence: No N/A N/A Measurements L x W x D (cm) 13.5x14x0.2 N/A N/A Area (cm) : 148.44 N/A N/A Volume (cm) : 29.688 N/A N/A % Reduction in Area: -107.70% N/A N/A % Reduction in Volume: -315.40% N/A N/A Classification: Grade 2 N/A N/A Exudate Amount: Medium N/A N/A Exudate Type: Serosanguineous N/A N/A Exudate Color: red, brown N/A N/A Foul Odor After Cleansing: Yes N/A N/A Odor Anticipated Due to Product No N/A N/A Use: Granulation Amount: Small (1-33%) N/A N/A Granulation Quality: Red, Pink N/A N/A Necrotic Amount: Large (67-100%) N/A N/A Necrotic Tissue: Eschar, Adherent Slough N/A N/A Exposed Structures: Fat Layer (Subcutaneous Tissue):  N/A N/A Yes Fascia: No Tendon: No Muscle: No Joint: No Bone: No Epithelialization: None N/A N/A Treatment Notes Electronic Signature(s) Signed: 06/03/2021 3:50:43 PM By: Ollen Gross, Virgil Benedict ([id number]) Entered By: Levora Dredge on 06/03/2021 13:20:08 Shane Bean ([id number]) -------------------------------------------------------------------------------- Multi-Disciplinary Care Plan Details Patient Name: Shane Bean Date of Service: 06/03/2021 1:00 PM Medical Record Number: [id number] Patient Account Number: [id number] Date of Birth/Sex: 1955/10/15 (65 y.o. M) Treating RN: Levora Dredge Primary Care Herbert Aguinaldo: Reeves Dam Other Clinician: Referring Jushua Waltman: Reeves Dam Treating Helaina Stefano/Extender: Skipper Cliche in Treatment: 8 Active Inactive Necrotic Tissue Nursing Diagnoses: Impaired tissue integrity related to necrotic/devitalized tissue Knowledge  deficit related to management of necrotic/devitalized tissue Goals: Necrotic/devitalized tissue will be minimized in the wound bed Date Initiated: 04/15/2021 Target Resolution Date: 04/15/2021 Goal Status: Active Patient/caregiver will verbalize understanding of reason and process for debridement of necrotic tissue Date Initiated: 04/15/2021 Date Inactivated: 05/20/2021 Target Resolution Date: 04/15/2021 Goal Status: Met Interventions: Assess patient pain level pre-, during and post procedure and prior to discharge Provide education on necrotic tissue and debridement process Notes: Wound/Skin Impairment Nursing Diagnoses: Knowledge deficit related to ulceration/compromised skin integrity Goals: Patient/caregiver will verbalize understanding of skin care regimen Date Initiated: 04/08/2021 Date Inactivated: 05/04/2021 Target Resolution Date: 05/08/2021 Goal Status: Met Ulcer/skin breakdown will have a volume reduction of 30% by week 4 Date Initiated: 04/08/2021 Target Resolution Date:  06/08/2021 Goal Status: Active Ulcer/skin breakdown will have a volume reduction of 50% by week 8 Date Initiated: 04/08/2021 Target Resolution Date: 07/08/2021 Goal Status: Active Ulcer/skin breakdown will have a volume reduction of 80% by week 12 Date Initiated: 04/08/2021 Target Resolution Date: 08/08/2021 Goal Status: Active Ulcer/skin breakdown will heal within 14 weeks Date Initiated: 04/08/2021 Target Resolution Date: 09/08/2021 Goal Status: Active Interventions: Assess patient/caregiver ability to obtain necessary supplies Assess patient/caregiver ability to perform ulcer/skin care regimen upon admission and as needed Assess ulceration(s) every visit Notes: Electronic Signature(s) Signed: 06/03/2021 3:50:43 PM By: Levora Dredge Entered By: Levora Dredge on 06/03/2021 13:20:00 Shane Bean ([id number] Bean ([id number]) -------------------------------------------------------------------------------- Pain Assessment Details Patient Name: Shane Bean Date of Service: 06/03/2021 1:00 PM Medical Record Number: [id number] Patient Account Number: [id number] Date of Birth/Sex: [birth year] (66 y.o. M) Treating RN: Levora Dredge Primary Care Jacqueline Spofford: Reeves Dam Other Clinician: Referring Ysabela Keisler: Reeves Dam Treating Shanikia Kernodle/Extender: Skipper Cliche in Treatment: 8 Active Problems Location of Pain Severity and Description of Pain Patient Has Paino No Site Locations Rate the pain. Current Pain Level: 0 Pain Management and Medication Current Pain Management: Electronic Signature(s) Signed: 06/03/2021 3:50:43 PM By: Levora Dredge Entered By: Levora Dredge on 06/03/2021 13:08:56 Shane Bean ([id number]) -------------------------------------------------------------------------------- Patient/Caregiver Education Details Patient Name: Shane Bean Date of Service: 06/03/2021 1:00 PM Medical Record Number: [id number] Patient Account  Number: [id number] Date of Birth/Gender: Sep 03, 1955 (65 y.o. M) Treating RN: Levora Dredge Primary Care Physician: Reeves Dam Other Clinician: Referring Physician: Reeves Dam Treating Physician/Extender: Skipper Cliche in Treatment: 8 Education Assessment Education Provided To: Patient Education Topics Provided Wound Debridement: Handouts: Wound Debridement Methods: Explain/Verbal Responses: State content correctly Wound/Skin Impairment: Handouts: Caring for Your Ulcer Methods: Explain/Verbal Responses: State content correctly Electronic Signature(s) Signed: 06/03/2021 3:50:43 PM By: Levora Dredge Entered By: Levora Dredge on 06/03/2021 14:24:34 Shane Bean ([id number]) -------------------------------------------------------------------------------- Wound Assessment Details Patient Name: Shane Bean Date of Service: 06/03/2021 1:00 PM Medical Record Number: [id number] Patient Account Number: [id number] Date of Birth/Sex: [birth year] (65 y.o. M) Treating RN: Levora Dredge Primary Care Ahamed Hofland: Reeves Dam Other Clinician: Referring Zhane Donlan: Reeves Dam Treating Bentlie Withem/Extender: Jeri Cos Weeks in Treatment: 8 Wound Status Wound Number: 3 Primary Etiology: Diabetic Wound/Ulcer of the Lower Extremity Wound Location: Right, Lateral, Circumferential Lower Leg Wound Status: Open Wounding Event: Gradually Appeared Comorbid History: Hypertension, Type II Diabetes, Neuropathy Date Acquired: 02/03/2021 Weeks Of Treatment: 8 Clustered Wound: No Photos Wound Measurements Length: (cm) 13.5 Width: (cm) 14 Depth: (cm) 0.2 Area: (cm) 148.44 Volume: (cm) 29.688 % Reduction in Area: -107.7% % Reduction in Volume: -315.4% Epithelialization: None Tunneling: No Undermining: No Wound Description Classification: Grade 2 Exudate Amount: Medium Exudate Type: Serosanguineous Exudate Color: red, brown Foul  Odor After Cleansing: Yes Due to Product  Use: No Slough/Fibrino Yes Wound Bed Granulation Amount: Small (1-33%) Exposed Structure Granulation Quality: Red, Pink Fascia Exposed: No Necrotic Amount: Large (67-100%) Fat Layer (Subcutaneous Tissue) Exposed: Yes Necrotic Quality: Eschar, Adherent Slough Tendon Exposed: No Muscle Exposed: No Joint Exposed: No Bone Exposed: No Treatment Notes Wound #3 (Lower Leg) Wound Laterality: Right, Lateral, Circumferential Cleanser Soap and Water Discharge Instruction: Gently cleanse wound with antibacterial soap, rinse and pat dry prior to dressing wounds Wound Cleanser Discharge Instruction: Wash your hands with soap and water. Remove old dressing, discard into plastic bag and place into trash. Cleanse the wound with Wound Cleanser prior to applying a clean dressing using gauze sponges, not tissues or cotton balls. Do not Stowell, Kalel V. ([id number]) scrub or use excessive force. Pat dry using gauze sponges, not tissue or cotton balls. Peri-Wound Care Topical Primary Dressing Xeroform 5x9-HBD (in/in) Discharge Instruction: Apply Xeroform 5x9-HBD (in/in) as directed Secondary Dressing ABD Pad 5x9 (in/in) Discharge Instruction: 2 pads to Cover with ABD pad Secured With Compression Wrap 3-LAYER WRAP - Profore Lite LF 3 Multilayer Compression Bandaging System Discharge Instruction: Apply 3 multi-layer wrap as prescribed. Compression Stockings Add-Ons Electronic Signature(s) Signed: 06/03/2021 3:50:43 PM By: Levora Dredge Entered By: Levora Dredge on 06/03/2021 13:19:18 Shane Bean ([id number]) -------------------------------------------------------------------------------- Vitals Details Patient Name: Shane Bean Date of Service: 06/03/2021 1:00 PM Medical Record Number: [id number] Patient Account Number: [id number] Date of Birth/Sex: 1955-05-29 (65 y.o. M) Treating RN: Levora Dredge Primary Care Damarien Nyman: Reeves Dam Other Clinician: Referring Cassandra Harbold:  Reeves Dam Treating Narda Fundora/Extender: Jeri Cos Weeks in Treatment: 8 Vital Signs Time Taken: 13:06 Temperature (F): 98.2 Height (in): 70 Pulse (bpm): 172 Weight (lbs): 277 Respiratory Rate (breaths/min): 18 Body Mass Index (BMI): 39.7 Blood Pressure (mmHg): 126/83 Reference Range: 80 - 120 mg / dl Electronic Signature(s) Signed: 06/03/2021 3:50:43 PM By: Levora Dredge Entered By: Levora Dredge on 06/03/2021 13:08:48

## 2021-06-03 NOTE — Progress Notes (Addendum)
ABDIKADIR, FOHL ([id number]) Visit Report for 06/03/2021 Chief Complaint Document Details Patient Name: Shane Bean, Shane Bean Date of Service: 06/03/2021 1:00 PM Medical Record Number: [id number] Patient Account Number: [id number] Date of Birth/Sex: 1955/11/05 (65 y.o. M) Treating RN: Levora Dredge Primary Care Provider: Reeves Dam Other Clinician: Referring Provider: Reeves Dam Treating Provider/Extender: Skipper Cliche in Treatment: 8 Information Obtained from: Patient Chief Complaint Left LE Ulcer Electronic Signature(s) Signed: 06/03/2021 1:19:48 PM By: Worthy Keeler PA-C Entered By: Worthy Keeler on 06/03/2021 13:19:47 Shane Bean ([id number]) -------------------------------------------------------------------------------- Debridement Details Patient Name: Shane Bean Date of Service: 06/03/2021 1:00 PM Medical Record Number: [id number] Patient Account Number: [id number] Date of Birth/Sex: 1955-07-27 (65 y.o. M) Treating RN: Levora Dredge Primary Care Provider: Reeves Dam Other Clinician: Referring Provider: Reeves Dam Treating Provider/Extender: Jeri Cos Weeks in Treatment: 8 Debridement Performed for Wound #3 Right,Lateral,Circumferential Lower Leg Assessment: Performed By: Physician Tommie Sams., PA-C Debridement Type: Debridement Severity of Tissue Pre Debridement: Fat layer exposed Level of Consciousness (Pre- Awake and Alert procedure): Pre-procedure Verification/Time Out Yes - 13:29 Taken: Pain Control: Lidocaine 4% Topical Solution Total Area Debrided (L x W): 13 (cm) x 7 (cm) = 91 (cm) Tissue and other material Viable, Non-Viable, Slough, Subcutaneous, Biofilm, Slough debrided: Level: Skin/Subcutaneous Tissue Debridement Description: Excisional Instrument: Curette Bleeding: Moderate Hemostasis Achieved: Pressure Response to Treatment: Procedure was tolerated well Level of Consciousness (Post- Awake and  Alert procedure): Post Debridement Measurements of Total Wound Length: (cm) 13.5 Width: (cm) 14 Depth: (cm) 0.2 Volume: (cm) 29.688 Character of Wound/Ulcer Post Debridement: Stable Severity of Tissue Post Debridement: Fat layer exposed Post Procedure Diagnosis Same as Pre-procedure Electronic Signature(s) Signed: 06/03/2021 2:20:41 PM By: Worthy Keeler PA-C Signed: 06/03/2021 3:50:43 PM By: Levora Dredge Entered By: Worthy Keeler on 06/03/2021 14:20:41 Shane Bean ([id number]) -------------------------------------------------------------------------------- HPI Details Patient Name: Shane Bean Date of Service: 06/03/2021 1:00 PM Medical Record Number: [id number] Patient Account Number: [id number] Date of Birth/Sex: 01/30/1955 (65 y.o. M) Treating RN: Levora Dredge Primary Care Provider: Reeves Dam Other Clinician: Referring Provider: Reeves Dam Treating Provider/Extender: Skipper Cliche in Treatment: 8 History of Present Illness HPI Description: 05/18/2020 upon evaluation today patient appears for initial evaluation here in clinic concerning issues that he has been having with the wound on his left lateral leg. Fortunately there does not appear to be any signs of obvious an active infection at this time which is great news. With that being said the patient unfortunately is continued to have issues with pain although he tells me it is gotten a lot better. He was on Keflex as well as Bactrim DS which seems to have done a good job there. His most recent hemoglobin A1c was 9.8 that was on 04/14/2020. Subsequently I do feel like that the patient is making progress here. He does have a history of chronic venous insufficiency as well as hypertension. I do not see any need for antibiotics at this point. 5/25; follow-up of the wound on the left posterior calf. This is completely necrotic on the surface. It does not look infected but it is painful. He is a diabetic  but I think there is some suggestion here of chronic venous disease as well. We used Iodoflex under compression last week 6/1; again a completely necrotic surface on this with the wound. We have been using Iodoflex under compression he complains of gnawing pain. He has had wounds previously a lot of this looks like chronic venous disease. 6/8;  about two thirds of the surface of this wound is still covered in a black necrotic eschar. I changed him from Iodoflex to [place] last week because of complaints of pain. He actually was seen by her neurologist this weeko Peripheral neuropathy as a cause of the pain and they changed him from Neurontin to Lyrica but he still has not had any relief. He says that most of the pain however is in his heel not in his wound per se. He is a diabetic 6/15. This is a patient with what looks to be a venous wound on the left lateral lower leg. He is also a diabetic. Currently being worked up for diabetic neuropathy. He complains of pain out of proportion to the size of the wound although to be fair the entire area here was eschared. We have been working to get a viable surface. We are using Sorbact 6/22; fairly painful wound on the left posterior calf. I am assuming this is been venous he is also a diabetic. His ABI in our clinic was noncompressible however he has easily palpable pulses on his feet. He complains of a lot of pain in the wound also of the left heel and the upper left calf. Some of this may be neuropathy. It is led me to discontinue Iodoflex reduce his compression but he still complains of pain in fact he says he could not take a debridement today. When I first saw this it was completely necrotic surface we have got it down to something that looks a reasonably healthy I have been wondering about biopsying this area however he is on Coumadin and with the pain I have put this off. The major question would be an inflammatory ulcer such as pyoderma. The patient is  not aware of how this started. He does however have a wound history on both legs. 6/29; patient presents for 1 week follow-up. He has been using Hydrofera Blue under Kerlix/Coban. He has tolerated this well. He currently denies signs of infection. He also declines debridement today. He he states it is painful and does not want to have this done. 7/6; Patient presents for 1 week follow-up. He has been using Hydrofera Blue under Kerlix/Coban. He denies signs of infection. He declines debridement today. 7/15; patient presents for 1 week follow-up. He has been using Hydrofera Blue under Kerlix/Coban. He denies signs of infection today. He is agreeable to having debridement done today. 7/20; patient presents for 1 week follow-up. He is in good spirits today. He has been using Hydrofera Blue under Kerlix/Coban. He denies signs of infection today. 7/27; patient presents for 1 week follow-up. He is in good spirits today. He has no issues or complaints today. He denies signs of infection. 08/12/2020 upon evaluation today patient appears to be doing better in regard to his wound. He has been tolerating the dressing changes without complication. With that being said he did have arterial studies and does show that he has a TBI on the right of 0.90 and a TBI on the left of 0.76 which is excellent. There is no evidence of arterial compromise at this point. 8/17; patient presents for 1 week follow-up. He has no issues or complaints today. He denies signs of infection. 8/24; patient presents for 1 week follow-up. He has no issues or complaints today. He denies signs of infection. He continues to tolerate the compression wrap well. 8/31; patient presents for 1 week follow-up. He has no issues or complaints today. He denies signs of infection.  He is in good spirits today. 9/7; patient presents for 1 week follow-up. He is tolerated the compression wrap well. He has no issues or complaints today. He denies signs  of infection. 9/14; patient presents for 1 week follow-up. He has no issues or complaints today. He denies signs of infection. He is in excellent spirits today. 9/21; patient presents for 1 week follow-up. He has no issues or complaints today. 9/28; patient presents for 1 week follow-up. He has tolerated the compression wrap well. He has no issues or complaints today. 10/5; patient presents for 1 week follow-up. He has no issues or complaints today. 10/12; patient presents for 1 week follow-up. He has no issues or complaints today. He denies signs of infection. Shane Bean, Shane Bean ([id number]) Readmission: 04-08-2021 upon evaluation today patient appears to be doing poorly in regard to his right lower extremity. He is being seen for reevaluation here in the clinic he was last discharged healed in October 2022. Subsequently at that time it was noted in the chart that he was told to be wearing compression socks unfortunately the patient tells me he never remembers being told that. I did discuss this with him today as well as Hosie Poisson discussing it with him today again. Obviously he does know at this point once he is healed he should be wearing compression socks all the time from the time he gets up in the morning to when he goes to sleep at night day in and day out. I think this is the only way that you can to keep things under control. Otherwise his medical history has not changed his arterial study in the past was excellent and did not appear to show any signs of issue at this point which is great news as well. 04-15-2021 upon evaluation today patient's wound is actually looking much drier than what it was last week. Again I do believe the infection is showing signs of improvement although he is having pain for sore I think this has to do more with the dryness of the wound at this time as opposed to the infection that we were noted in the last week. Fortunately I do not see any evidence of active  infection locally or systemically at this time which is great news. 04-22-2021 upon evaluation today patient unfortunately continues to have issues with his leg. This is getting significantly worse despite being on Augmentin. I am actually extremely concerned about worsening infection and especially considering the way the wound looks today and the odor present. 05-04-2021 upon evaluation today patient appears to be doing better with regard to the infection in his leg. He has been tolerating the dressing changes without complication and overall I am extremely pleased with where we stand. There does not appear to be any evidence of active infection locally or systemically which is great news. 05-13-2021 upon evaluation today patient appears to be doing better in regard to his leg ulcer. He has been tolerating the dressing changes without complication. He does have still significant pain he does have a lot of necrotic tissue which is working its way off and there is no signs of significant infection at this point which is great news. With that being said I do believe that the patient does need to likely continue with the wound care measures at this point with regard to the dressing that we are utilizing. 05-20-2021 upon evaluation today patient's wound is actually showing signs of fairly good granulation and epithelization in a lot of areas  he still has some slough noted at this point though a lot of the eschar is softening up I do believe the Xeroform has been helpful in this regard. Fortunately I do not see any evidence of active infection locally or systemically which is great news. No fevers, chills, nausea, vomiting, or diarrhea. I do believe the 3 layer compression wrap has been beneficial. 05-27-2021 upon evaluation today patient's wound is actually showing signs of significant improvement with a lot of new epithelial growth at this point. Fortunately I do not see any signs of active infection  locally or systemically which is great news and overall I think we are headed in the right direction. 06-03-2021 upon evaluation today patient appears to be doing well with regard to his wounds although he still has a lot of slough and biofilm buildup noted. We are going to need to try to do some sharp debridement today to clear this away and he is in agreement with allowing me to try what we can. I am not certain how much working to be able to remove or not. Electronic Signature(s) Signed: 06/03/2021 2:18:26 PM By: Worthy Keeler PA-C Entered By: Worthy Keeler on 06/03/2021 14:18:26 Shane Bean ([id number]) -------------------------------------------------------------------------------- Physical Exam Details Patient Name: Shane Bean Date of Service: 06/03/2021 1:00 PM Medical Record Number: [id number] Patient Account Number: [id number] Date of Birth/Sex: [birth year] (65 y.o. M) Treating RN: Levora Dredge Primary Care Provider: Reeves Dam Other Clinician: Referring Provider: Reeves Dam Treating Provider/Extender: Jeri Cos Weeks in Treatment: 8 Constitutional Well-nourished and well-hydrated in no acute distress. Respiratory normal breathing without difficulty. Psychiatric this patient is able to make decisions and demonstrates good insight into disease process. Alert and Oriented x 3. pleasant and cooperative. Notes Upon inspection patient's wound bed again did have significant slough and biofilm buildup. I was able to go over the surface of the wound and clean fairly thoroughly I had to be careful still he is on a blood thinner I do not want him bleeding too much. Nonetheless I was able to get a vast majority of the necrotic tissue and slough off the surface of the wound which I think is going to greatly improve his chances of getting this closed much more quickly. Overall I am extremely happy with where things stand today. Electronic Signature(s) Signed:  06/03/2021 2:19:06 PM By: Worthy Keeler PA-C Entered By: Worthy Keeler on 06/03/2021 14:19:06 Shane Bean ([id number]) -------------------------------------------------------------------------------- Physician Orders Details Patient Name: Shane Bean Date of Service: 06/03/2021 1:00 PM Medical Record Number: [id number] Patient Account Number: [id number] Date of Birth/Sex: 1955-09-22 (65 y.o. M) Treating RN: Levora Dredge Primary Care Provider: Reeves Dam Other Clinician: Referring Provider: Reeves Dam Treating Provider/Extender: Skipper Cliche in Treatment: 8 Verbal / Phone Orders: No Diagnosis Coding ICD-10 Coding Code Description E11.622 Type 2 diabetes mellitus with other skin ulcer I87.2 Venous insufficiency (chronic) (peripheral) 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 layer exposed L97.812 Non-pressure chronic ulcer of other part of right lower leg with fat layer exposed I10 Essential (primary) hypertension Follow-up Appointments o Return Appointment in 1 week. - weekly wrap o Nurse Visit as needed Bathing/ Shower/ Hygiene o May shower; gently cleanse wound with antibacterial soap, rinse and pat dry prior to dressing wounds o May shower with wound dressing protected with water repellent cover or cast protector. o No tub bath. Anesthetic (Use 'Patient Medications' Section for Anesthetic Order Entry) o Lidocaine applied to wound  bed Edema Control - Lymphedema / Segmental Compressive Device / Other o Elevate, Exercise Daily and Avoid Standing for Long Periods of Time. o Elevate legs to the level of the heart and pump ankles as often as possible o Elevate leg(s) parallel to the floor when sitting. Additional Orders / Instructions o Follow Nutritious Diet and Increase Protein Intake - monitor blood sugar Wound Treatment Wound #3 - Lower Leg Wound Laterality: Right, Lateral,  Circumferential Cleanser: Soap and Water 1 x Per Week/15 Days Discharge Instructions: Gently cleanse wound with antibacterial soap, rinse and pat dry prior to dressing wounds Cleanser: Wound Cleanser 1 x Per Week/15 Days Discharge Instructions: Wash your hands with soap and water. Remove old dressing, discard into plastic bag and place into trash. Cleanse the wound with Wound Cleanser prior to applying a clean dressing using gauze sponges, not tissues or cotton balls. Do not scrub or use excessive force. Pat dry using gauze sponges, not tissue or cotton balls. Primary Dressing: Xeroform 5x9-HBD (in/in) 1 x Per Week/15 Days Discharge Instructions: Apply Xeroform 5x9-HBD (in/in) as directed Secondary Dressing: ABD Pad 5x9 (in/in) 1 x Per Week/15 Days Discharge Instructions: 2 pads to Cover with ABD pad Compression Wrap: 3-LAYER WRAP - Profore Lite LF 3 Multilayer Compression Bandaging System 1 x Per Week/15 Days Discharge Instructions: Apply 3 multi-layer wrap as prescribed. Electronic Signature(s) Signed: 06/03/2021 3:50:43 PM By: Levora Dredge Signed: 06/03/2021 4:22:20 PM By: Worthy Keeler PA-C Entered By: Levora Dredge on 06/03/2021 13:45:03 Shane Bean, Shane Bean ([id number], Shane Bean ([id number]) -------------------------------------------------------------------------------- Problem List Details Patient Name: Shane Bean Date of Service: 06/03/2021 1:00 PM Medical Record Number: [id number] Patient Account Number: [id number] Date of Birth/Sex: 1955-12-20 (66 y.o. M) Treating RN: Levora Dredge Primary Care Provider: Reeves Dam Other Clinician: Referring Provider: Reeves Dam Treating Provider/Extender: Jeri Cos Weeks in Treatment: 8 Active Problems ICD-10 Encounter Code Description Active Date MDM Diagnosis E11.622 Type 2 diabetes mellitus with other skin ulcer 04/08/2021 No Yes I87.2 Venous insufficiency (chronic) (peripheral) 04/08/2021 No Yes L97.822  Non-pressure chronic ulcer of other part of left lower leg with fat layer 04/08/2021 No Yes exposed L97.812 Non-pressure chronic ulcer of other part of right lower leg with fat layer 04/08/2021 No Yes exposed [place] (primary) hypertension 04/08/2021 No Yes Inactive Problems Resolved Problems Electronic Signature(s) Signed: 06/03/2021 1:19:44 PM By: Worthy Keeler PA-C Entered By: Worthy Keeler on 06/03/2021 13:19:44 Shane Bean ([id number]) -------------------------------------------------------------------------------- Progress Note Details Patient Name: Shane Bean Date of Service: 06/03/2021 1:00 PM Medical Record Number: [id number] Patient Account Number: [id number] Date of Birth/Sex: 1955-11-28 (65 y.o. M) Treating RN: Levora Dredge Primary Care Provider: Reeves Dam Other Clinician: Referring Provider: Reeves Dam Treating Provider/Extender: Skipper Cliche in Treatment: 8 Subjective Chief Complaint Information obtained from Patient Left LE Ulcer History of Present Illness (HPI) 05/18/2020 upon evaluation today patient appears for initial evaluation here in clinic concerning issues that he has been having with the wound on his left lateral leg. Fortunately there does not appear to be any signs of obvious an active infection at this time which is great news. With that being said the patient unfortunately is continued to have issues with pain although he tells me it is gotten a lot better. He was on Keflex as well as Bactrim DS which seems to have done a good job there. His most recent hemoglobin A1c was 9.8 that was on 04/14/2020. Subsequently I do feel like that the patient is making progress here. He  does have a history of chronic venous insufficiency as well as hypertension. I do not see any need for antibiotics at this point. 5/25; follow-up of the wound on the left posterior calf. This is completely necrotic on the surface. It does not look infected but  it is painful. He is a diabetic but I think there is some suggestion here of chronic venous disease as well. We used Iodoflex under compression last week 6/1; again a completely necrotic surface on this with the wound. We have been using Iodoflex under compression he complains of gnawing pain. He has had wounds previously a lot of this looks like chronic venous disease. 6/8; about two thirds of the surface of this wound is still covered in a black necrotic eschar. I changed him from Iodoflex to [place] last week because of complaints of pain. He actually was seen by her neurologist this weeko Peripheral neuropathy as a cause of the pain and they changed him from Neurontin to Lyrica but he still has not had any relief. He says that most of the pain however is in his heel not in his wound per se. He is a diabetic 6/15. This is a patient with what looks to be a venous wound on the left lateral lower leg. He is also a diabetic. Currently being worked up for diabetic neuropathy. He complains of pain out of proportion to the size of the wound although to be fair the entire area here was eschared. We have been working to get a viable surface. We are using Sorbact 6/22; fairly painful wound on the left posterior calf. I am assuming this is been venous he is also a diabetic. His ABI in our clinic was noncompressible however he has easily palpable pulses on his feet. He complains of a lot of pain in the wound also of the left heel and the upper left calf. Some of this may be neuropathy. It is led me to discontinue Iodoflex reduce his compression but he still complains of pain in fact he says he could not take a debridement today. When I first saw this it was completely necrotic surface we have got it down to something that looks a reasonably healthy I have been wondering about biopsying this area however he is on Coumadin and with the pain I have put this off. The major question would be an inflammatory ulcer  such as pyoderma. The patient is not aware of how this started. He does however have a wound history on both legs. 6/29; patient presents for 1 week follow-up. He has been using Hydrofera Blue under Kerlix/Coban. He has tolerated this well. He currently denies signs of infection. He also declines debridement today. He he states it is painful and does not want to have this done. 7/6; Patient presents for 1 week follow-up. He has been using Hydrofera Blue under Kerlix/Coban. He denies signs of infection. He declines debridement today. 7/15; patient presents for 1 week follow-up. He has been using Hydrofera Blue under Kerlix/Coban. He denies signs of infection today. He is agreeable to having debridement done today. 7/20; patient presents for 1 week follow-up. He is in good spirits today. He has been using Hydrofera Blue under Kerlix/Coban. He denies signs of infection today. 7/27; patient presents for 1 week follow-up. He is in good spirits today. He has no issues or complaints today. He denies signs of infection. 08/12/2020 upon evaluation today patient appears to be doing better in regard to his wound. He has been tolerating  the dressing changes without complication. With that being said he did have arterial studies and does show that he has a TBI on the right of 0.90 and a TBI on the left of 0.76 which is excellent. There is no evidence of arterial compromise at this point. 8/17; patient presents for 1 week follow-up. He has no issues or complaints today. He denies signs of infection. 8/24; patient presents for 1 week follow-up. He has no issues or complaints today. He denies signs of infection. He continues to tolerate the compression wrap well. 8/31; patient presents for 1 week follow-up. He has no issues or complaints today. He denies signs of infection. He is in good spirits today. 9/7; patient presents for 1 week follow-up. He is tolerated the compression wrap well. He has no issues or  complaints today. He denies signs of infection. 9/14; patient presents for 1 week follow-up. He has no issues or complaints today. He denies signs of infection. He is in excellent spirits today. 9/21; patient presents for 1 week follow-up. He has no issues or complaints today. Shane Bean, Shane Bean ([id number]) 9/28; patient presents for 1 week follow-up. He has tolerated the compression wrap well. He has no issues or complaints today. 10/5; patient presents for 1 week follow-up. He has no issues or complaints today. 10/12; patient presents for 1 week follow-up. He has no issues or complaints today. He denies signs of infection. Readmission: 04-08-2021 upon evaluation today patient appears to be doing poorly in regard to his right lower extremity. He is being seen for reevaluation here in the clinic he was last discharged healed in October 2022. Subsequently at that time it was noted in the chart that he was told to be wearing compression socks unfortunately the patient tells me he never remembers being told that. I did discuss this with him today as well as Hosie Poisson discussing it with him today again. Obviously he does know at this point once he is healed he should be wearing compression socks all the time from the time he gets up in the morning to when he goes to sleep at night day in and day out. I think this is the only way that you can to keep things under control. Otherwise his medical history has not changed his arterial study in the past was excellent and did not appear to show any signs of issue at this point which is great news as well. 04-15-2021 upon evaluation today patient's wound is actually looking much drier than what it was last week. Again I do believe the infection is showing signs of improvement although he is having pain for sore I think this has to do more with the dryness of the wound at this time as opposed to the infection that we were noted in the last week. Fortunately I do not  see any evidence of active infection locally or systemically at this time which is great news. 04-22-2021 upon evaluation today patient unfortunately continues to have issues with his leg. This is getting significantly worse despite being on Augmentin. I am actually extremely concerned about worsening infection and especially considering the way the wound looks today and the odor present. 05-04-2021 upon evaluation today patient appears to be doing better with regard to the infection in his leg. He has been tolerating the dressing changes without complication and overall I am extremely pleased with where we stand. There does not appear to be any evidence of active infection locally or systemically which is great news.  05-13-2021 upon evaluation today patient appears to be doing better in regard to his leg ulcer. He has been tolerating the dressing changes without complication. He does have still significant pain he does have a lot of necrotic tissue which is working its way off and there is no signs of significant infection at this point which is great news. With that being said I do believe that the patient does need to likely continue with the wound care measures at this point with regard to the dressing that we are utilizing. 05-20-2021 upon evaluation today patient's wound is actually showing signs of fairly good granulation and epithelization in a lot of areas he still has some slough noted at this point though a lot of the eschar is softening up I do believe the Xeroform has been helpful in this regard. Fortunately I do not see any evidence of active infection locally or systemically which is great news. No fevers, chills, nausea, vomiting, or diarrhea. I do believe the 3 layer compression wrap has been beneficial. 05-27-2021 upon evaluation today patient's wound is actually showing signs of significant improvement with a lot of new epithelial growth at this point. Fortunately I do not see any signs  of active infection locally or systemically which is great news and overall I think we are headed in the right direction. 06-03-2021 upon evaluation today patient appears to be doing well with regard to his wounds although he still has a lot of slough and biofilm buildup noted. We are going to need to try to do some sharp debridement today to clear this away and he is in agreement with allowing me to try what we can. I am not certain how much working to be able to remove or not. Objective Constitutional Well-nourished and well-hydrated in no acute distress. Vitals Time Taken: 1:06 PM, Height: 70 in, Weight: 277 lbs, BMI: 39.7, Temperature: 98.2 F, Pulse: 172 bpm, Respiratory Rate: 18 breaths/min, Blood Pressure: 126/83 mmHg. Respiratory normal breathing without difficulty. Psychiatric this patient is able to make decisions and demonstrates good insight into disease process. Alert and Oriented x 3. pleasant and cooperative. General Notes: Upon inspection patient's wound bed again did have significant slough and biofilm buildup. I was able to go over the surface of the wound and clean fairly thoroughly I had to be careful still he is on a blood thinner I do not want him bleeding too much. Nonetheless I was able to get a vast majority of the necrotic tissue and slough off the surface of the wound which I think is going to greatly improve his chances of getting this closed much more quickly. Overall I am extremely happy with where things stand today. Integumentary (Hair, Skin) Wound #3 status is Open. Original cause of wound was Gradually Appeared. The date acquired was: 02/03/2021. The wound has been in treatment 8 weeks. The wound is located on the Right,Lateral,Circumferential Lower Leg. The wound measures 13.5cm length x 14cm width x 0.2cm depth; Zane, Sparrow V. ([id number]) 148.44cm^2 area and 29.688cm^3 volume. There is Fat Layer (Subcutaneous Tissue) exposed. There is no tunneling or  undermining noted. There is a medium amount of serosanguineous drainage noted. Foul odor after cleansing was noted. There is small (1-33%) red, pink granulation within the wound bed. There is a large (67-100%) amount of necrotic tissue within the wound bed including Eschar and Adherent Slough. Assessment Active Problems ICD-10 Type 2 diabetes mellitus with other skin ulcer Venous insufficiency (chronic) (peripheral) Non-pressure chronic ulcer of other part  of left lower leg with fat layer exposed Non-pressure chronic ulcer of other part of right lower leg with fat layer exposed Essential (primary) hypertension Procedures Wound #3 Pre-procedure diagnosis of Wound #3 is a Diabetic Wound/Ulcer of the Lower Extremity located on the Right,Lateral,Circumferential Lower Leg .Severity of Tissue Pre Debridement is: Fat layer exposed. There was a Excisional Skin/Subcutaneous Tissue Debridement with a total area of 91 sq cm performed by Tommie Sams., PA-C. With the following instrument(s): Curette to remove Viable and Non-Viable tissue/material. Material removed includes Subcutaneous Tissue, Slough, and Biofilm after achieving pain control using Lidocaine 4% Topical Solution. No specimens were taken. A time out was conducted at 13:29, prior to the start of the procedure. A Moderate amount of bleeding was controlled with Pressure. The procedure was tolerated well. Post Debridement Measurements: 13.5cm length x 14cm width x 0.2cm depth; 29.688cm^3 volume. Character of Wound/Ulcer Post Debridement is stable. Severity of Tissue Post Debridement is: Fat layer exposed. Post procedure Diagnosis Wound #3: Same as Pre-Procedure Of note the actual debrided area was smaller than the actual wound size as I did not make it to the entirety of the wound he was unable to tolerate this completely. Plan Follow-up Appointments: Return Appointment in 1 week. - weekly wrap Nurse Visit as needed Bathing/ Shower/  Hygiene: May shower; gently cleanse wound with antibacterial soap, rinse and pat dry prior to dressing wounds May shower with wound dressing protected with water repellent cover or cast protector. No tub bath. Anesthetic (Use 'Patient Medications' Section for Anesthetic Order Entry): Lidocaine applied to wound bed Edema Control - Lymphedema / Segmental Compressive Device / Other: Elevate, Exercise Daily and Avoid Standing for Long Periods of Time. Elevate legs to the level of the heart and pump ankles as often as possible Elevate leg(s) parallel to the floor when sitting. Additional Orders / Instructions: Follow Nutritious Diet and Increase Protein Intake - monitor blood sugar WOUND #3: - Lower Leg Wound Laterality: Right, Lateral, Circumferential Cleanser: Soap and Water 1 x Per Week/15 Days Discharge Instructions: Gently cleanse wound with antibacterial soap, rinse and pat dry prior to dressing wounds Cleanser: Wound Cleanser 1 x Per Week/15 Days Discharge Instructions: Wash your hands with soap and water. Remove old dressing, discard into plastic bag and place into trash. Cleanse the wound with Wound Cleanser prior to applying a clean dressing using gauze sponges, not tissues or cotton balls. Do not scrub or use excessive force. Pat dry using gauze sponges, not tissue or cotton balls. Primary Dressing: Xeroform 5x9-HBD (in/in) 1 x Per Week/15 Days Discharge Instructions: Apply Xeroform 5x9-HBD (in/in) as directed Secondary Dressing: ABD Pad 5x9 (in/in) 1 x Per Week/15 Days Discharge Instructions: 2 pads to Cover with ABD pad Compression Wrap: 3-LAYER WRAP - Profore Lite LF 3 Multilayer Compression Bandaging System 1 x Per Week/15 Days Discharge Instructions: Apply 3 multi-layer wrap as prescribed. Shane Bean, Shane V. ([id number]) 1. I am going to recommend that we go ahead and continue with the wound care measures as before and the patient is in agreement with plan for using Xeroform  this is the best thing that does not stick to him. He does have a lot of drainage but nonetheless the Xeroform is helping to clear a lot of this away as far as loosen up the slough is concerned. 2. I am also going to recommend that we continue with the 3 layer compression wrap which I do believe is also doing quite well for him. We will see patient  back for reevaluation in 1 week here in the clinic. If anything worsens or changes patient will contact our office for additional recommendations. Electronic Signature(s) Signed: 06/03/2021 2:21:15 PM By: Worthy Keeler PA-C Previous Signature: 06/03/2021 2:19:45 PM Version By: Worthy Keeler PA-C Entered By: Worthy Keeler on 06/03/2021 14:21:15 Shane Bean ([id number]) -------------------------------------------------------------------------------- SuperBill Details Patient Name: Shane Bean Date of Service: 06/03/2021 Medical Record Number: [id number] Patient Account Number: [id number] Date of Birth/Sex: 27-Nov-1955 (65 y.o. M) Treating RN: Levora Dredge Primary Care Provider: Reeves Dam Other Clinician: Referring Provider: Reeves Dam Treating Provider/Extender: Jeri Cos Weeks in Treatment: 8 Diagnosis Coding ICD-10 Codes Code Description E11.622 Type 2 diabetes mellitus with other skin ulcer I87.2 Venous insufficiency (chronic) (peripheral) 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 layer exposed L97.812 Non-pressure chronic ulcer of other part of right lower leg with fat layer exposed [place] (primary) hypertension Facility Procedures CPT4 Code: 25427062 Description: 37628 - DEB SUBQ TISSUE 20 SQ CM/< Modifier: Quantity: 1 CPT4 Code: Description: ICD-10 Diagnosis Description B15.176 Non-pressure chronic ulcer of other part of right lower leg with fat lay Modifier: er exposed Quantity: CPT4 Code: 16073710 Description: 11045 - DEB SUBQ TISS EA ADDL  20CM Modifier: Quantity: 4 CPT4 Code: Description: ICD-10 Diagnosis Description G26.948 Non-pressure chronic ulcer of other part of right lower leg with fat lay Modifier: er exposed Quantity: Physician Procedures CPT4 Code: 5462703 Description: 11042 - WC PHYS SUBQ TISS 20 SQ CM Modifier: Quantity: 1 CPT4 Code: Description: ICD-10 Diagnosis Description J00.938 Non-pressure chronic ulcer of other part of right lower leg with fat laye Modifier: r exposed Quantity: CPT4 Code: 1829937 Description: 16967 - WC PHYS SUBQ TISS EA ADDL 20 CM Modifier: Quantity: 4 CPT4 Code: Description: ICD-10 Diagnosis Description E93.810 Non-pressure chronic ulcer of other part of right lower leg with fat laye Modifier: r exposed Quantity: Electronic Signature(s) Signed: 06/03/2021 2:21:26 PM By: Worthy Keeler PA-C Entered By: Worthy Keeler on 06/03/2021 14:21:26

## 2021-06-10 ENCOUNTER — Encounter: Payer: Medicare (Managed Care) | Admitting: Physician Assistant

## 2021-06-10 DIAGNOSIS — E11622 Type 2 diabetes mellitus with other skin ulcer: Secondary | ICD-10-CM | POA: Diagnosis not present

## 2021-06-10 NOTE — Progress Notes (Addendum)
[name], Shane Bean ([id number]) Visit Report for 06/10/2021 Chief Complaint Document Details Patient Name: Shane, Bean Date of Service: 06/10/2021 1:30 PM Medical Record Number: [id number] Patient Account Number: [id number] Date of Birth/Sex: 04-19-1955 (66 y.o. M) Treating RN: Levora Dredge Primary Care Provider: Reeves Dam Other Clinician: Massie Kluver Referring Provider: Reeves Dam Treating Provider/Extender: Skipper Cliche in Treatment: 9 Information Obtained from: Patient Chief Complaint Left LE Ulcer Electronic Signature(s) Signed: 06/10/2021 1:46:22 PM By: Worthy Keeler PA-C Entered By: Worthy Keeler on 06/10/2021 [place], [place]. ([id number]) -------------------------------------------------------------------------------- HPI Details Patient Name: Shane Bean Date of Service: 06/10/2021 1:30 PM Medical Record Number: [id number] Patient Account Number: [id number] Date of Birth/Sex: 1955-07-07 (65 y.o. M) Treating RN: Levora Dredge Primary Care Provider: Reeves Dam Other Clinician: Massie Kluver Referring Provider: Reeves Dam Treating Provider/Extender: Skipper Cliche in Treatment: 9 History of Present Illness HPI Description: 05/18/2020 upon evaluation today patient appears for initial evaluation here in clinic concerning issues that he has been having with the wound on his left lateral leg. Fortunately there does not appear to be any signs of obvious an active infection at this time which is great news. With that being said the patient unfortunately is continued to have issues with pain although he tells me it is gotten a lot better. He was on Keflex as well as Bactrim DS which seems to have done a good job there. His most recent hemoglobin A1c was 9.8 that was on 04/14/2020. Subsequently I do feel like that the patient is making progress here. He does have a history of chronic venous insufficiency as well as hypertension. I do  not see any need for antibiotics at this point. 5/25; follow-up of the wound on the left posterior calf. This is completely necrotic on the surface. It does not look infected but it is painful. He is a diabetic but I think there is some suggestion here of chronic venous disease as well. We used Iodoflex under compression last week 6/1; again a completely necrotic surface on this with the wound. We have been using Iodoflex under compression he complains of gnawing pain. He has had wounds previously a lot of this looks like chronic venous disease. 6/8; about two thirds of the surface of this wound is still covered in a black necrotic eschar. I changed him from Iodoflex to [place] last week because of complaints of pain. He actually was seen by her neurologist this weeko Peripheral neuropathy as a cause of the pain and they changed him from Neurontin to Lyrica but he still has not had any relief. He says that most of the pain however is in his heel not in his wound per se. He is a diabetic 6/15. This is a patient with what looks to be a venous wound on the left lateral lower leg. He is also a diabetic. Currently being worked up for diabetic neuropathy. He complains of pain out of proportion to the size of the wound although to be fair the entire area here was eschared. We have been working to get a viable surface. We are using Sorbact 6/22; fairly painful wound on the left posterior calf. I am assuming this is been venous he is also a diabetic. His ABI in our clinic was noncompressible however he has easily palpable pulses on his feet. He complains of a lot of pain in the wound also of the left heel and the upper left calf. Some of this may be neuropathy. It is  led me to discontinue Iodoflex reduce his compression but he still complains of pain in fact he says he could not take a debridement today. When I first saw this it was completely necrotic surface we have got it down to something that looks  a reasonably healthy I have been wondering about biopsying this area however he is on Coumadin and with the pain I have put this off. The major question would be an inflammatory ulcer such as pyoderma. The patient is not aware of how this started. He does however have a wound history on both legs. 6/29; patient presents for 1 week follow-up. He has been using Hydrofera Blue under Kerlix/Coban. He has tolerated this well. He currently denies signs of infection. He also declines debridement today. He he states it is painful and does not want to have this done. 7/6; Patient presents for 1 week follow-up. He has been using Hydrofera Blue under Kerlix/Coban. He denies signs of infection. He declines debridement today. 7/15; patient presents for 1 week follow-up. He has been using Hydrofera Blue under Kerlix/Coban. He denies signs of infection today. He is agreeable to having debridement done today. 7/20; patient presents for 1 week follow-up. He is in good spirits today. He has been using Hydrofera Blue under Kerlix/Coban. He denies signs of infection today. 7/27; patient presents for 1 week follow-up. He is in good spirits today. He has no issues or complaints today. He denies signs of infection. 08/12/2020 upon evaluation today patient appears to be doing better in regard to his wound. He has been tolerating the dressing changes without complication. With that being said he did have arterial studies and does show that he has a TBI on the right of 0.90 and a TBI on the left of 0.76 which is excellent. There is no evidence of arterial compromise at this point. 8/17; patient presents for 1 week follow-up. He has no issues or complaints today. He denies signs of infection. 8/24; patient presents for 1 week follow-up. He has no issues or complaints today. He denies signs of infection. He continues to tolerate the compression wrap well. 8/31; patient presents for 1 week follow-up. He has no issues or  complaints today. He denies signs of infection. He is in good spirits today. 9/7; patient presents for 1 week follow-up. He is tolerated the compression wrap well. He has no issues or complaints today. He denies signs of infection. 9/14; patient presents for 1 week follow-up. He has no issues or complaints today. He denies signs of infection. He is in excellent spirits today. 9/21; patient presents for 1 week follow-up. He has no issues or complaints today. 9/28; patient presents for 1 week follow-up. He has tolerated the compression wrap well. He has no issues or complaints today. 10/5; patient presents for 1 week follow-up. He has no issues or complaints today. 10/12; patient presents for 1 week follow-up. He has no issues or complaints today. He denies signs of infection. Shane Bean, Shane Bean ([id number]) Readmission: 04-08-2021 upon evaluation today patient appears to be doing poorly in regard to his right lower extremity. He is being seen for reevaluation here in the clinic he was last discharged healed in October 2022. Subsequently at that time it was noted in the chart that he was told to be wearing compression socks unfortunately the patient tells me he never remembers being told that. I did discuss this with him today as well as Hosie Poisson discussing it with him today again. Obviously he does know at  this point once he is healed he should be wearing compression socks all the time from the time he gets up in the morning to when he goes to sleep at night day in and day out. I think this is the only way that you can to keep things under control. Otherwise his medical history has not changed his arterial study in the past was excellent and did not appear to show any signs of issue at this point which is great news as well. 04-15-2021 upon evaluation today patient's wound is actually looking much drier than what it was last week. Again I do believe the infection is showing signs of improvement although  he is having pain for sore I think this has to do more with the dryness of the wound at this time as opposed to the infection that we were noted in the last week. Fortunately I do not see any evidence of active infection locally or systemically at this time which is great news. 04-22-2021 upon evaluation today patient unfortunately continues to have issues with his leg. This is getting significantly worse despite being on Augmentin. I am actually extremely concerned about worsening infection and especially considering the way the wound looks today and the odor present. 05-04-2021 upon evaluation today patient appears to be doing better with regard to the infection in his leg. He has been tolerating the dressing changes without complication and overall I am extremely pleased with where we stand. There does not appear to be any evidence of active infection locally or systemically which is great news. 05-13-2021 upon evaluation today patient appears to be doing better in regard to his leg ulcer. He has been tolerating the dressing changes without complication. He does have still significant pain he does have a lot of necrotic tissue which is working its way off and there is no signs of significant infection at this point which is great news. With that being said I do believe that the patient does need to likely continue with the wound care measures at this point with regard to the dressing that we are utilizing. 05-20-2021 upon evaluation today patient's wound is actually showing signs of fairly good granulation and epithelization in a lot of areas he still has some slough noted at this point though a lot of the eschar is softening up I do believe the Xeroform has been helpful in this regard. Fortunately I do not see any evidence of active infection locally or systemically which is great news. No fevers, chills, nausea, vomiting, or diarrhea. I do believe the 3 layer compression wrap has been  beneficial. 05-27-2021 upon evaluation today patient's wound is actually showing signs of significant improvement with a lot of new epithelial growth at this point. Fortunately I do not see any signs of active infection locally or systemically which is great news and overall I think we are headed in the right direction. 06-03-2021 upon evaluation today patient appears to be doing well with regard to his wounds although he still has a lot of slough and biofilm buildup noted. We are going to need to try to do some sharp debridement today to clear this away and he is in agreement with allowing me to try what we can. I am not certain how much working to be able to remove or not. 06-10-2021 upon evaluation today patient appears to be doing well with regard to his wound from the overall appearance and size standpoint this is good. From a drainage standpoint and the  odor as well this seems to still be infected in my opinion. I do not think the Augmentin was sufficient. With that being said I discussed this with the patient I do believe that he may benefit from topical Keystone antibiotics in the meantime we will get a use gentamicin to bridge the gap until we get those. Electronic Signature(s) Signed: 06/10/2021 2:17:53 PM By: Worthy Keeler PA-C Entered By: Worthy Keeler on 06/10/2021 14:17:53 Shane Bean ([id number]) -------------------------------------------------------------------------------- Physical Exam Details Patient Name: Shane Bean Date of Service: 06/10/2021 1:30 PM Medical Record Number: [id number] Patient Account Number: [id number] Date of Birth/Sex: [birth year] (65 y.o. M) Treating RN: Levora Dredge Primary Care Provider: Reeves Dam Other Clinician: Massie Kluver Referring Provider: Reeves Dam Treating Provider/Extender: Skipper Cliche in Treatment: 9 Notes Upon inspection patient's wound bed actually showed signs of good granulation in the position at this  point. Fortunately there does not appear to be any evidence of active infection systemically at this time which is great news. No fevers, chills, nausea, vomiting, or diarrhea. Locally I am still concerned about bacterial infection based on the odor and the amount and color of drainage noted. Electronic Signature(s) Signed: 06/10/2021 2:18:18 PM By: Worthy Keeler PA-C Entered By: Worthy Keeler on 06/10/2021 14:18:17 Shane Bean ([id number]) -------------------------------------------------------------------------------- Physician Orders Details Patient Name: Shane Bean Date of Service: 06/10/2021 1:30 PM Medical Record Number: [id number] Patient Account Number: [id number] Date of Birth/Sex: 01/09/1955 (65 y.o. M) Treating RN: Levora Dredge Primary Care Provider: Reeves Dam Other Clinician: Massie Kluver Referring Provider: Reeves Dam Treating Provider/Extender: Skipper Cliche in Treatment: 9 Verbal / Phone Orders: No Diagnosis Coding ICD-10 Coding Code Description E11.622 Type 2 diabetes mellitus with other skin ulcer I87.2 Venous insufficiency (chronic) (peripheral) 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 layer exposed L97.812 Non-pressure chronic ulcer of other part of right lower leg with fat layer exposed I10 Essential (primary) hypertension Follow-up Appointments o Return Appointment in 1 week. - weekly wrap o Nurse Visit as needed Bathing/ Shower/ Hygiene o May shower; gently cleanse wound with antibacterial soap, rinse and pat dry prior to dressing wounds o May shower with wound dressing protected with water repellent cover or cast protector. o No tub bath. Anesthetic (Use 'Patient Medications' Section for Anesthetic Order Entry) o Lidocaine applied to wound bed Edema Control - Lymphedema / Segmental Compressive Device / Other o Elevate, Exercise Daily and Avoid Standing for Long Periods of  Time. o Elevate legs to the level of the heart and pump ankles as often as possible o Elevate leg(s) parallel to the floor when sitting. Additional Orders / Instructions o Follow Nutritious Diet and Increase Protein Intake - monitor blood sugar Medications-Please add to medication list. o Keystone Compound - Keystone initiated today Wound Treatment Wound #3 - Lower Leg Wound Laterality: Right, Lateral, Circumferential Cleanser: Soap and Water 1 x Per Week/15 Days Discharge Instructions: Gently cleanse wound with antibacterial soap, rinse and pat dry prior to dressing wounds Cleanser: Wound Cleanser 1 x Per Week/15 Days Discharge Instructions: Wash your hands with soap and water. Remove old dressing, discard into plastic bag and place into trash. Cleanse the wound with Wound Cleanser prior to applying a clean dressing using gauze sponges, not tissues or cotton balls. Do not scrub or use excessive force. Pat dry using gauze sponges, not tissue or cotton balls. Topical: Gentamicin 1 x Per Week/15 Days Discharge Instructions: Apply thin film topically until  Keystone antibiotic recieved Primary Dressing: Xeroform 5x9-HBD (in/in) 1 x Per Week/15 Days Discharge Instructions: Apply Xeroform 5x9-HBD (in/in) as directed Secondary Dressing: ABD Pad 5x9 (in/in) 1 x Per Week/15 Days Discharge Instructions: 2 pads to Cover with ABD pad Compression Wrap: 3-LAYER WRAP - Profore Lite LF 3 Multilayer Compression Bandaging System 1 x Per Week/15 Days Discharge Instructions: Apply 3 multi-layer wrap as prescribed. Shane Bean, Shane Bean ([id number]) Electronic Signature(s) Signed: 06/10/2021 4:11:43 PM By: Worthy Keeler PA-C Signed: 06/11/2021 4:23:30 PM By: Massie Kluver Entered By: Massie Kluver on 06/10/2021 14:16:54 Shane Bean ([id number]) -------------------------------------------------------------------------------- Problem List Details Patient Name: Shane Bean Date of  Service: 06/10/2021 1:30 PM Medical Record Number: [id number] Patient Account Number: [id number] Date of Birth/Sex: 1955-02-05 (65 y.o. M) Treating RN: Levora Dredge Primary Care Provider: Reeves Dam Other Clinician: Massie Kluver Referring Provider: Reeves Dam Treating Provider/Extender: Jeri Cos Weeks in Treatment: 9 Active Problems ICD-10 Encounter Code Description Active Date MDM Diagnosis E11.622 Type 2 diabetes mellitus with other skin ulcer 04/08/2021 No Yes I87.2 Venous insufficiency (chronic) (peripheral) 04/08/2021 No Yes 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 layer 04/08/2021 No Yes exposed L97.812 Non-pressure chronic ulcer of other part of right lower leg with fat layer 04/08/2021 No Yes exposed [place] (primary) hypertension 04/08/2021 No Yes Inactive Problems Resolved Problems Electronic Signature(s) Signed: 06/10/2021 1:46:19 PM By: Worthy Keeler PA-C Entered By: Worthy Keeler on 06/10/2021 [place], [place]. ([id number]) -------------------------------------------------------------------------------- Progress Note Details Patient Name: Shane Bean Date of Service: 06/10/2021 1:30 PM Medical Record Number: [id number] Patient Account Number: [id number] Date of Birth/Sex: Mar 03, 1955 (65 y.o. M) Treating RN: Levora Dredge Primary Care Provider: Reeves Dam Other Clinician: Massie Kluver Referring Provider: Reeves Dam Treating Provider/Extender: Skipper Cliche in Treatment: 9 Subjective Chief Complaint Information obtained from Patient Left LE Ulcer History of Present Illness (HPI) 05/18/2020 upon evaluation today patient appears for initial evaluation here in clinic concerning issues that he has been having with the wound on his left lateral leg. Fortunately there does not appear to be any signs of obvious an active infection at this time which is great news. With that being said the patient  unfortunately is continued to have issues with pain although he tells me it is gotten a lot better. He was on Keflex as well as Bactrim DS which seems to have done a good job there. His most recent hemoglobin A1c was 9.8 that was on 04/14/2020. Subsequently I do feel like that the patient is making progress here. He does have a history of chronic venous insufficiency as well as hypertension. I do not see any need for antibiotics at this point. 5/25; follow-up of the wound on the left posterior calf. This is completely necrotic on the surface. It does not look infected but it is painful. He is a diabetic but I think there is some suggestion here of chronic venous disease as well. We used Iodoflex under compression last week 6/1; again a completely necrotic surface on this with the wound. We have been using Iodoflex under compression he complains of gnawing pain. He has had wounds previously a lot of this looks like chronic venous disease. 6/8; about two thirds of the surface of this wound is still covered in a black necrotic eschar. I changed him from Iodoflex to [place] last week because of complaints of pain. He actually was seen by her neurologist this weeko Peripheral neuropathy as a cause of the  pain and they changed him from Neurontin to Lyrica but he still has not had any relief. He says that most of the pain however is in his heel not in his wound per se. He is a diabetic 6/15. This is a patient with what looks to be a venous wound on the left lateral lower leg. He is also a diabetic. Currently being worked up for diabetic neuropathy. He complains of pain out of proportion to the size of the wound although to be fair the entire area here was eschared. We have been working to get a viable surface. We are using Sorbact 6/22; fairly painful wound on the left posterior calf. I am assuming this is been venous he is also a diabetic. His ABI in our clinic was noncompressible however he has easily  palpable pulses on his feet. He complains of a lot of pain in the wound also of the left heel and the upper left calf. Some of this may be neuropathy. It is led me to discontinue Iodoflex reduce his compression but he still complains of pain in fact he says he could not take a debridement today. When I first saw this it was completely necrotic surface we have got it down to something that looks a reasonably healthy I have been wondering about biopsying this area however he is on Coumadin and with the pain I have put this off. The major question would be an inflammatory ulcer such as pyoderma. The patient is not aware of how this started. He does however have a wound history on both legs. 6/29; patient presents for 1 week follow-up. He has been using Hydrofera Blue under Kerlix/Coban. He has tolerated this well. He currently denies signs of infection. He also declines debridement today. He he states it is painful and does not want to have this done. 7/6; Patient presents for 1 week follow-up. He has been using Hydrofera Blue under Kerlix/Coban. He denies signs of infection. He declines debridement today. 7/15; patient presents for 1 week follow-up. He has been using Hydrofera Blue under Kerlix/Coban. He denies signs of infection today. He is agreeable to having debridement done today. 7/20; patient presents for 1 week follow-up. He is in good spirits today. He has been using Hydrofera Blue under Kerlix/Coban. He denies signs of infection today. 7/27; patient presents for 1 week follow-up. He is in good spirits today. He has no issues or complaints today. He denies signs of infection. 08/12/2020 upon evaluation today patient appears to be doing better in regard to his wound. He has been tolerating the dressing changes without complication. With that being said he did have arterial studies and does show that he has a TBI on the right of 0.90 and a TBI on the left of 0.76 which is excellent. There is no  evidence of arterial compromise at this point. 8/17; patient presents for 1 week follow-up. He has no issues or complaints today. He denies signs of infection. 8/24; patient presents for 1 week follow-up. He has no issues or complaints today. He denies signs of infection. He continues to tolerate the compression wrap well. 8/31; patient presents for 1 week follow-up. He has no issues or complaints today. He denies signs of infection. He is in good spirits today. 9/7; patient presents for 1 week follow-up. He is tolerated the compression wrap well. He has no issues or complaints today. He denies signs of infection. 9/14; patient presents for 1 week follow-up. He has no issues or complaints today.  He denies signs of infection. He is in excellent spirits today. 9/21; patient presents for 1 week follow-up. He has no issues or complaints today. Shane Bean, Shane Bean ([id number]) 9/28; patient presents for 1 week follow-up. He has tolerated the compression wrap well. He has no issues or complaints today. 10/5; patient presents for 1 week follow-up. He has no issues or complaints today. 10/12; patient presents for 1 week follow-up. He has no issues or complaints today. He denies signs of infection. Readmission: 04-08-2021 upon evaluation today patient appears to be doing poorly in regard to his right lower extremity. He is being seen for reevaluation here in the clinic he was last discharged healed in October 2022. Subsequently at that time it was noted in the chart that he was told to be wearing compression socks unfortunately the patient tells me he never remembers being told that. I did discuss this with him today as well as Hosie Poisson discussing it with him today again. Obviously he does know at this point once he is healed he should be wearing compression socks all the time from the time he gets up in the morning to when he goes to sleep at night day in and day out. I think this is the only way that you can to  keep things under control. Otherwise his medical history has not changed his arterial study in the past was excellent and did not appear to show any signs of issue at this point which is great news as well. 04-15-2021 upon evaluation today patient's wound is actually looking much drier than what it was last week. Again I do believe the infection is showing signs of improvement although he is having pain for sore I think this has to do more with the dryness of the wound at this time as opposed to the infection that we were noted in the last week. Fortunately I do not see any evidence of active infection locally or systemically at this time which is great news. 04-22-2021 upon evaluation today patient unfortunately continues to have issues with his leg. This is getting significantly worse despite being on Augmentin. I am actually extremely concerned about worsening infection and especially considering the way the wound looks today and the odor present. 05-04-2021 upon evaluation today patient appears to be doing better with regard to the infection in his leg. He has been tolerating the dressing changes without complication and overall I am extremely pleased with where we stand. There does not appear to be any evidence of active infection locally or systemically which is great news. 05-13-2021 upon evaluation today patient appears to be doing better in regard to his leg ulcer. He has been tolerating the dressing changes without complication. He does have still significant pain he does have a lot of necrotic tissue which is working its way off and there is no signs of significant infection at this point which is great news. With that being said I do believe that the patient does need to likely continue with the wound care measures at this point with regard to the dressing that we are utilizing. 05-20-2021 upon evaluation today patient's wound is actually showing signs of fairly good granulation and  epithelization in a lot of areas he still has some slough noted at this point though a lot of the eschar is softening up I do believe the Xeroform has been helpful in this regard. Fortunately I do not see any evidence of active infection locally or systemically which is great news.  No fevers, chills, nausea, vomiting, or diarrhea. I do believe the 3 layer compression wrap has been beneficial. 05-27-2021 upon evaluation today patient's wound is actually showing signs of significant improvement with a lot of new epithelial growth at this point. Fortunately I do not see any signs of active infection locally or systemically which is great news and overall I think we are headed in the right direction. 06-03-2021 upon evaluation today patient appears to be doing well with regard to his wounds although he still has a lot of slough and biofilm buildup noted. We are going to need to try to do some sharp debridement today to clear this away and he is in agreement with allowing me to try what we can. I am not certain how much working to be able to remove or not. 06-10-2021 upon evaluation today patient appears to be doing well with regard to his wound from the overall appearance and size standpoint this is good. From a drainage standpoint and the odor as well this seems to still be infected in my opinion. I do not think the Augmentin was sufficient. With that being said I discussed this with the patient I do believe that he may benefit from topical [facility] antibiotics in the meantime we will get a use gentamicin to bridge the gap until we get those. Objective Constitutional Vitals Time Taken: 1:45 PM, Height: 70 in, Weight: 277 lbs, BMI: 39.7, Temperature: 98.3 F, Pulse: 68 bpm, Respiratory Rate: 16 breaths/min, Blood Pressure: 121/81 mmHg. Integumentary (Hair, Skin) Wound #3 status is Open. Original cause of wound was Gradually Appeared. The date acquired was: 02/03/2021. The wound has been in treatment 9  weeks. The wound is located on the Right,Lateral,Circumferential Lower Leg. The wound measures 14cm length x 14.2cm width x 0.2cm depth; 156.137cm^2 area and 31.227cm^3 volume. There is Fat Layer (Subcutaneous Tissue) exposed. There is a medium amount of serosanguineous drainage noted. Foul odor after cleansing was noted. There is small (1-33%) red, pink, hyper - granulation within the wound bed. There is a large (67-100%) amount of necrotic tissue within the wound bed including Eschar and Adherent Slough. Shane Bean, Shane Bean ([id number]) Assessment Active Problems ICD-10 Type 2 diabetes mellitus with other skin ulcer Venous insufficiency (chronic) (peripheral) Non-pressure chronic ulcer of other part of left lower leg with fat layer exposed Non-pressure chronic ulcer of other part of right lower leg with fat layer exposed Essential (primary) hypertension Plan Follow-up Appointments: Return Appointment in 1 week. - weekly wrap Nurse Visit as needed Bathing/ Shower/ Hygiene: May shower; gently cleanse wound with antibacterial soap, rinse and pat dry prior to dressing wounds May shower with wound dressing protected with water repellent cover or cast protector. No tub bath. Anesthetic (Use 'Patient Medications' Section for Anesthetic Order Entry): Lidocaine applied to wound bed Edema Control - Lymphedema / Segmental Compressive Device / Other: Elevate, Exercise Daily and Avoid Standing for Long Periods of Time. Elevate legs to the level of the heart and pump ankles as often as possible Elevate leg(s) parallel to the floor when sitting. Additional Orders / Instructions: Follow Nutritious Diet and Increase Protein Intake - monitor blood sugar Medications-Please add to medication list.: [employer] - [facility] initiated today WOUND #3: - Lower Leg Wound Laterality: Right, Lateral, Circumferential Cleanser: Soap and Water 1 x Per Week/15 Days Discharge Instructions: Gently cleanse  wound with antibacterial soap, rinse and pat dry prior to dressing wounds Cleanser: Wound Cleanser 1 x Per Week/15 Days Discharge Instructions: Wash your hands with  soap and water. Remove old dressing, discard into plastic bag and place into trash. Cleanse the wound with Wound Cleanser prior to applying a clean dressing using gauze sponges, not tissues or cotton balls. Do not scrub or use excessive force. Pat dry using gauze sponges, not tissue or cotton balls. Topical: Gentamicin 1 x Per Week/15 Days Discharge Instructions: Apply thin film topically until [facility] antibiotic recieved Primary Dressing: Xeroform 5x9-HBD (in/in) 1 x Per Week/15 Days Discharge Instructions: Apply Xeroform 5x9-HBD (in/in) as directed Secondary Dressing: ABD Pad 5x9 (in/in) 1 x Per Week/15 Days Discharge Instructions: 2 pads to Cover with ABD pad Compression Wrap: 3-LAYER WRAP - Profore Lite LF 3 Multilayer Compression Bandaging System 1 x Per Week/15 Days Discharge Instructions: Apply 3 multi-layer wrap as prescribed. 1. I am going to recommend currently that we going continue with the wound care measures as before and the patient is in agreement with the plan. This includes the use of the Xeroform gauze dressing begin use layer of gentamicin underneath. 2. Also can recommend ABD pad to cover followed by a 3 layer compression wrap over top. 3. I am also can recommend that we go ahead and see about ordering the [facility] antibiotics. I think that this is get a be beneficial for him I did go ahead and put in the order today and they will be contacting the patient. The prescription will be sent over this afternoon. We will see patient back for reevaluation in 1 week here in the clinic. If anything worsens or changes patient will contact our office for additional recommendations. Electronic Signature(s) Signed: 06/10/2021 2:19:02 PM By: Worthy Keeler PA-C Entered By: Worthy Keeler on 06/10/2021 14:19:01 Shane Bean ([id number]) -------------------------------------------------------------------------------- SuperBill Details Patient Name: Shane Bean Date of Service: 06/10/2021 Medical Record Number: [id number] Patient Account Number: [id number] Date of Birth/Sex: 1955-05-31 (65 y.o. M) Treating RN: Levora Dredge Primary Care Provider: Reeves Dam Other Clinician: Massie Kluver Referring Provider: Reeves Dam Treating Provider/Extender: Jeri Cos Weeks in Treatment: 9 Diagnosis Coding ICD-10 Codes Code Description E11.622 Type 2 diabetes mellitus with other skin ulcer I87.2 Venous insufficiency (chronic) (peripheral) 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 layer exposed L97.812 Non-pressure chronic ulcer of other part of right lower leg with fat layer exposed I10 Essential (primary) hypertension Facility Procedures CPT4 Code: 66294765 Description: (Facility Use Only) [phone] - [facility] LWR RT LEG Modifier: Quantity: 1 Physician Procedures CPT4 Code: 6568127 Description: 51700 - WC PHYS LEVEL 4 - EST PT Modifier: Quantity: 1 CPT4 Code: Description: ICD-10 Diagnosis Description E11.622 Type 2 diabetes mellitus with other skin ulcer I87.2 Venous insufficiency (chronic) (peripheral) 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 lay L97.812 Non-pressure  chronic ulcer of other part of right lower leg with fat la Modifier: er exposed yer exposed Quantity: Electronic Signature(s) Signed: 06/11/2021 8:51:43 AM By: Carlene Coria RN Signed: 06/11/2021 5:38:41 PM By: Worthy Keeler PA-C Previous Signature: 06/10/2021 2:27:51 PM Version By: Worthy Keeler PA-C Entered By: Carlene Coria on 06/11/2021 08:51:43

## 2021-06-11 NOTE — Progress Notes (Signed)
NAIM, MURTHA ([id number]) Visit Report for 06/10/2021 Arrival Information Details Patient Name: Shane Bean, Shane Bean Date of Service: 06/10/2021 1:30 PM Medical Record Number: [id number] Patient Account Number: [id number] Date of Birth/Sex: December 10, 1955 (65 y.o. M) Treating RN: Levora Dredge Primary Care Heba Ige: Reeves Dam Other Clinician: Massie Kluver Referring Morey Andonian: Reeves Dam Treating Fayette Hamada/Extender: Skipper Cliche in Treatment: 9 Visit Information History Since Last Visit Added or deleted any medications: No Patient Arrived: Ambulatory Any new allergies or adverse reactions: No Arrival Time: 13:44 Had a fall or experienced change in No Transfer Assistance: None activities of daily living that may affect Patient Requires Transmission-Based No risk of falls: Precautions: Hospitalized since last visit: No Patient Has Alerts: Yes Pain Present Now: Yes Patient Alerts: Patient on Blood Thinner ABI R McCone TBI .90 7/22 ABI L 1.46 TBI .76 7/22 Electronic Signature(s) Signed: 06/11/2021 4:23:30 PM By: Massie Kluver Entered By: Massie Kluver on 06/10/2021 13:45:19 Shane Bean ([id number]) -------------------------------------------------------------------------------- Compression Therapy Details Patient Name: Shane Bean Date of Service: 06/10/2021 1:30 PM Medical Record Number: [id number] Patient Account Number: [id number] Date of Birth/Sex: 1955-04-19 (65 y.o. M) Treating RN: Levora Dredge Primary Care Kahlee Metivier: Reeves Dam Other Clinician: Massie Kluver Referring Christianjames Soule: Reeves Dam Treating Raahil Ong/Extender: Jeri Cos Weeks in Treatment: 9 Compression Therapy Performed for Wound Assessment: Wound #3 Right,Lateral,Circumferential Lower Leg Performed By: Lenice Pressman, Angie, Compression Type: Three Layer Pre Treatment ABI: 1.5 Post Procedure Diagnosis Same as Pre-procedure Electronic Signature(s) Signed: 06/11/2021 4:23:30  PM By: Massie Kluver Entered By: Massie Kluver on 06/10/2021 16:34:30 Shane Bean ([id number]) -------------------------------------------------------------------------------- Encounter Discharge Information Details Patient Name: Shane Bean Date of Service: 06/10/2021 1:30 PM Medical Record Number: [id number] Patient Account Number: [id number] Date of Birth/Sex: 20-Aug-1955 (65 y.o. M) Treating RN: Levora Dredge Primary Care Legion Discher: Reeves Dam Other Clinician: Massie Kluver Referring Zaedyn Covin: Reeves Dam Treating Bertrand Vowels/Extender: Skipper Cliche in Treatment: 9 Encounter Discharge Information Items Discharge Condition: Stable Ambulatory Status: Ambulatory Discharge Destination: Home Transportation: Private Auto Schedule Follow-up Appointment: Yes Clinical Summary of Care: Electronic Signature(s) Signed: 06/11/2021 4:23:30 PM By: Massie Kluver Entered By: Massie Kluver on 06/10/2021 16:35:23 Shane Bean ([id number]) -------------------------------------------------------------------------------- Lower Extremity Assessment Details Patient Name: Shane Bean Date of Service: 06/10/2021 1:30 PM Medical Record Number: [id number] Patient Account Number: [id number] Date of Birth/Sex: 13-Nov-1955 (65 y.o. M) Treating RN: Levora Dredge Primary Care Margrett Kalb: Reeves Dam Other Clinician: Massie Kluver Referring Phyllistine Domingos: Reeves Dam Treating Gram Siedlecki/Extender: Jeri Cos Weeks in Treatment: 9 Edema Assessment Assessed: [Left: No] [Right: Yes] [Left: Edema] [Right: :] Calf Left: Right: Point of Measurement: 31 cm From Medial Instep 44.6 cm Ankle Left: Right: Point of Measurement: 10 cm From Medial Instep 26.6 cm Vascular Assessment Pulses: Dorsalis Pedis Palpable: [Right:Yes] Electronic Signature(s) Signed: 06/11/2021 4:15:36 PM By: Levora Dredge Signed: 06/11/2021 4:23:30 PM By: Massie Kluver Entered By: Massie Kluver on  06/10/2021 14:00:44 Shane Bean ([id number]) -------------------------------------------------------------------------------- Multi Wound Chart Details Patient Name: Shane Bean Date of Service: 06/10/2021 1:30 PM Medical Record Number: [id number] Patient Account Number: [id number] Date of Birth/Sex: 05/17/1955 (65 y.o. M) Treating RN: Levora Dredge Primary Care Fed Ceci: Reeves Dam Other Clinician: Massie Kluver Referring Rupert Azzara: Reeves Dam Treating Mariya Mottley/Extender: Skipper Cliche in Treatment: 9 Vital Signs Height(in): 70 Pulse(bpm): 10 Weight(lbs): 852 Blood Pressure(mmHg): 121/81 Body Mass Index(BMI): 39.7 Temperature(F): 98.3 Respiratory Rate(breaths/min): 16 Photos: [N/A:N/A] Wound Location: Right, Lateral, Circumferential N/A N/A Lower Leg Wounding Event: Gradually Appeared N/A N/A Primary Etiology: Diabetic Wound/Ulcer of the Lower N/A N/A  Extremity Comorbid History: Hypertension, Type II Diabetes, N/A N/A Neuropathy Date Acquired: 02/03/2021 N/A N/A Weeks of Treatment: 9 N/A N/A Wound Status: Open N/A N/A Wound Recurrence: No N/A N/A Measurements L x W x D (cm) 14x14.2x0.2 N/A N/A Area (cm) : 156.137 N/A N/A Volume (cm) : 31.227 N/A N/A % Reduction in Area: -118.50% N/A N/A % Reduction in Volume: -336.90% N/A N/A Classification: Grade 2 N/A N/A Exudate Amount: Medium N/A N/A Exudate Type: Serosanguineous N/A N/A Exudate Color: red, brown N/A N/A Foul Odor After Cleansing: Yes N/A N/A Odor Anticipated Due to Product No N/A N/A Use: Granulation Amount: Small (1-33%) N/A N/A Granulation Quality: Red, Pink, Hyper-granulation N/A N/A Necrotic Amount: Large (67-100%) N/A N/A Necrotic Tissue: Eschar, Adherent Slough N/A N/A Exposed Structures: Fat Layer (Subcutaneous Tissue): N/A N/A Yes Fascia: No Tendon: No Muscle: No Joint: No Bone: No Epithelialization: None N/A N/A Treatment Notes Electronic Signature(s) Signed: 06/11/2021  4:23:30 PM By: Tina Griffiths ([id number]) Entered By: Massie Kluver on 06/10/2021 14:11:53 Shane Bean ([id number]) -------------------------------------------------------------------------------- Multi-Disciplinary Care Plan Details Patient Name: Shane Bean Date of Service: 06/10/2021 1:30 PM Medical Record Number: [id number] Patient Account Number: [id number] Date of Birth/Sex: 1955/09/04 (65 y.o. M) Treating RN: Levora Dredge Primary Care Tan Clopper: Reeves Dam Other Clinician: Massie Kluver Referring Mykelle Cockerell: Reeves Dam Treating Malon Branton/Extender: Skipper Cliche in Treatment: 9 Active Inactive Necrotic Tissue Nursing Diagnoses: Impaired tissue integrity related to necrotic/devitalized tissue Knowledge deficit related to management of necrotic/devitalized tissue Goals: Necrotic/devitalized tissue will be minimized in the wound bed Date Initiated: 04/15/2021 Target Resolution Date: 04/15/2021 Goal Status: Active Patient/caregiver will verbalize understanding of reason and process for debridement of necrotic tissue Date Initiated: 04/15/2021 Date Inactivated: 05/20/2021 Target Resolution Date: 04/15/2021 Goal Status: Met Interventions: Assess patient pain level pre-, during and post procedure and prior to discharge Provide education on necrotic tissue and debridement process Notes: Wound/Skin Impairment Nursing Diagnoses: Knowledge deficit related to ulceration/compromised skin integrity Goals: Patient/caregiver will verbalize understanding of skin care regimen Date Initiated: 04/08/2021 Date Inactivated: 05/04/2021 Target Resolution Date: 05/08/2021 Goal Status: Met Ulcer/skin breakdown will have a volume reduction of 30% by week 4 Date Initiated: 04/08/2021 Target Resolution Date: 06/08/2021 Goal Status: Active Ulcer/skin breakdown will have a volume reduction of 50% by week 8 Date Initiated: 04/08/2021 Target Resolution Date:  07/08/2021 Goal Status: Active Ulcer/skin breakdown will have a volume reduction of 80% by week 12 Date Initiated: 04/08/2021 Target Resolution Date: 08/08/2021 Goal Status: Active Ulcer/skin breakdown will heal within 14 weeks Date Initiated: 04/08/2021 Target Resolution Date: 09/08/2021 Goal Status: Active Interventions: Assess patient/caregiver ability to obtain necessary supplies Assess patient/caregiver ability to perform ulcer/skin care regimen upon admission and as needed Assess ulceration(s) every visit Notes: Electronic Signature(s) Signed: 06/11/2021 4:15:36 PM By: Levora Dredge Signed: 06/11/2021 4:23:30 PM By: Tina Griffiths ([id number]) Entered By: Massie Kluver on 06/10/2021 14:11:35 Shane Bean ([id number]) -------------------------------------------------------------------------------- Pain Assessment Details Patient Name: Shane Bean Date of Service: 06/10/2021 1:30 PM Medical Record Number: [id number] Patient Account Number: [id number] Date of Birth/Sex: 14-Mar-1955 (65 y.o. M) Treating RN: Levora Dredge Primary Care Ritta Hammes: Reeves Dam Other Clinician: Massie Kluver Referring Charnele Semple: Reeves Dam Treating Clayburn Weekly/Extender: Skipper Cliche in Treatment: 9 Active Problems Location of Pain Severity and Description of Pain Patient Has Paino Yes Site Locations Pain Location: Pain in Ulcers With Dressing Change: Yes Duration of the Pain. Constant / Intermittento Constant Character of Pain Describe the Pain: Aching, Burning Pain Management and Medication  Current Pain Management: Medication: Yes Rest: Yes How does your wound impact your activities of daily livingo Sleep: Yes Electronic Signature(s) Signed: 06/10/2021 1:59:19 PM By: Levora Dredge Signed: 06/11/2021 4:23:30 PM By: Massie Kluver Entered By: Massie Kluver on 06/10/2021 13:48:27 Shane Bean  ([id number]) -------------------------------------------------------------------------------- Patient/Caregiver Education Details Patient Name: Shane Bean Date of Service: 06/10/2021 1:30 PM Medical Record Number: [id number] Patient Account Number: [id number] Date of Birth/Gender: June 13, 1955 (65 y.o. M) Treating RN: Levora Dredge Primary Care Physician: Reeves Dam Other Clinician: Massie Kluver Referring Physician: Reeves Dam Treating Physician/Extender: Skipper Cliche in Treatment: 9 Education Assessment Education Provided To: Patient Education Topics Provided Wound/Skin Impairment: Handouts: Other: continue wound care as directed Electronic Signature(s) Signed: 06/11/2021 4:23:30 PM By: Massie Kluver Entered By: Massie Kluver on 06/10/2021 14:39:18 Shane Bean ([id number]) -------------------------------------------------------------------------------- Wound Assessment Details Patient Name: Shane Bean Date of Service: 06/10/2021 1:30 PM Medical Record Number: [id number] Patient Account Number: [id number] Date of Birth/Sex: 07/19/1955 (65 y.o. M) Treating RN: Levora Dredge Primary Care Ernesteen Mihalic: Reeves Dam Other Clinician: Massie Kluver Referring Zelina Jimerson: Reeves Dam Treating Georgio Hattabaugh/Extender: Jeri Cos Weeks in Treatment: 9 Wound Status Wound Number: 3 Primary Etiology: Diabetic Wound/Ulcer of the Lower Extremity Wound Location: Right, Lateral, Circumferential Lower Leg Wound Status: Open Wounding Event: Gradually Appeared Comorbid History: Hypertension, Type II Diabetes, Neuropathy Date Acquired: 02/03/2021 Weeks Of Treatment: 9 Clustered Wound: No Photos Wound Measurements Length: (cm) 14 Width: (cm) 14.2 Depth: (cm) 0.2 Area: (cm) 156.137 Volume: (cm) 31.227 % Reduction in Area: -118.5% % Reduction in Volume: -336.9% Epithelialization: None Wound Description Classification: Grade 2 Exudate Amount: Medium Exudate  Type: Serosanguineous Exudate Color: red, brown Foul Odor After Cleansing: Yes Due to Product Use: No Slough/Fibrino Yes Wound Bed Granulation Amount: Small (1-33%) Exposed Structure Granulation Quality: Red, Pink, Hyper-granulation Fascia Exposed: No Necrotic Amount: Large (67-100%) Fat Layer (Subcutaneous Tissue) Exposed: Yes Necrotic Quality: Eschar, Adherent Slough Tendon Exposed: No Muscle Exposed: No Joint Exposed: No Bone Exposed: No Treatment Notes Wound #3 (Lower Leg) Wound Laterality: Right, Lateral, Circumferential Cleanser Soap and Water Discharge Instruction: Gently cleanse wound with antibacterial soap, rinse and pat dry prior to dressing wounds Wound Cleanser Discharge Instruction: Wash your hands with soap and water. Remove old dressing, discard into plastic bag and place into trash. Cleanse the wound with Wound Cleanser prior to applying a clean dressing using gauze sponges, not tissues or cotton balls. Do not Barcia, Burnell V. ([id number]) scrub or use excessive force. Pat dry using gauze sponges, not tissue or cotton balls. Peri-Wound Care Topical Gentamicin Discharge Instruction: Apply thin film topically until [facility] antibiotic recieved Primary Dressing Xeroform 5x9-HBD (in/in) Discharge Instruction: Apply Xeroform 5x9-HBD (in/in) as directed Secondary Dressing ABD Pad 5x9 (in/in) Discharge Instruction: 2 pads to Cover with ABD pad Secured With Compression Wrap 3-LAYER WRAP - Profore Lite LF 3 Multilayer Compression Bandaging System Discharge Instruction: Apply 3 multi-layer wrap as prescribed. Compression Stockings Add-Ons Electronic Signature(s) Signed: 06/11/2021 4:15:36 PM By: Levora Dredge Signed: 06/11/2021 4:23:30 PM By: Massie Kluver Previous Signature: 06/10/2021 1:59:19 PM Version By: Levora Dredge Entered By: Massie Kluver on 06/10/2021 13:59:23 Shane Bean  ([id number]) -------------------------------------------------------------------------------- Vitals Details Patient Name: Shane Bean Date of Service: 06/10/2021 1:30 PM Medical Record Number: [id number] Patient Account Number: [id number] Date of Birth/Sex: 12-22-1955 (65 y.o. M) Treating RN: Levora Dredge Primary Care Deeanne Deininger: Reeves Dam Other Clinician: Massie Kluver Referring Reuben Knoblock: Reeves Dam Treating Philippa Vessey/Extender: Jeri Cos Weeks in Treatment: 9 Vital Signs Time Taken: 13:45 Temperature (F):  98.3 Height (in): 70 Pulse (bpm): 68 Weight (lbs): 277 Respiratory Rate (breaths/min): 16 Body Mass Index (BMI): 39.7 Blood Pressure (mmHg): 121/81 Reference Range: 80 - 120 mg / dl Electronic Signature(s) Signed: 06/11/2021 4:23:30 PM By: Massie Kluver Entered By: Massie Kluver on 06/10/2021 13:47:32

## 2021-06-17 ENCOUNTER — Encounter: Payer: Medicare (Managed Care) | Admitting: Physician Assistant

## 2021-06-17 DIAGNOSIS — E11622 Type 2 diabetes mellitus with other skin ulcer: Secondary | ICD-10-CM | POA: Diagnosis not present

## 2021-06-17 NOTE — Progress Notes (Addendum)
Shane Bean ([id number]) Visit Report for 06/17/2021 Chief Complaint Document Details Patient Name: Shane Bean, Shane Bean Date of Service: 06/17/2021 1:45 PM Medical Record Number: [id number] Patient Account Number: [id number] Date of Birth/Sex: September 15, 1955 (66 y.o. M) Treating RN: Levora Dredge Primary Care Provider: Reeves Dam Other Clinician: Referring Provider: Reeves Dam Treating Provider/Extender: Jeri Cos Weeks in Treatment: 10 Information Obtained from: Patient Chief Complaint Left LE Ulcer Electronic Signature(s) Signed: 06/17/2021 1:54:39 PM By: Worthy Keeler PA-C Entered By: Worthy Keeler on 06/17/2021 13:54:39 Shane Bean ([id number]) -------------------------------------------------------------------------------- HPI Details Patient Name: Shane Bean Date of Service: 06/17/2021 1:45 PM Medical Record Number: [id number] Patient Account Number: [id number] Date of Birth/Sex: 03/06/1955 (65 y.o. M) Treating RN: Levora Dredge Primary Care Provider: Reeves Dam Other Clinician: Referring Provider: Reeves Dam Treating Provider/Extender: Skipper Cliche in Treatment: 10 History of Present Illness HPI Description: 05/18/2020 upon evaluation today patient appears for initial evaluation here in clinic concerning issues that he has been having with the wound on his left lateral leg. Fortunately there does not appear to be any signs of obvious an active infection at this time which is great news. With that being said the patient unfortunately is continued to have issues with pain although he tells me it is gotten a lot better. He was on Keflex as well as Bactrim DS which seems to have done a good job there. His most recent hemoglobin A1c was 9.8 that was on 04/14/2020. Subsequently I do feel like that the patient is making progress here. He does have a history of chronic venous insufficiency as well as hypertension. I do not see any need for  antibiotics at this point. 5/25; follow-up of the wound on the left posterior calf. This is completely necrotic on the surface. It does not look infected but it is painful. He is a diabetic but I think there is some suggestion here of chronic venous disease as well. We used Iodoflex under compression last week 6/1; again a completely necrotic surface on this with the wound. We have been using Iodoflex under compression he complains of gnawing pain. He has had wounds previously a lot of this looks like chronic venous disease. 6/8; about two thirds of the surface of this wound is still covered in a black necrotic eschar. I changed him from Iodoflex to [place] last week because of complaints of pain. He actually was seen by her neurologist this weeko Peripheral neuropathy as a cause of the pain and they changed him from Neurontin to Lyrica but he still has not had any relief. He says that most of the pain however is in his heel not in his wound per se. He is a diabetic 6/15. This is a patient with what looks to be a venous wound on the left lateral lower leg. He is also a diabetic. Currently being worked up for diabetic neuropathy. He complains of pain out of proportion to the size of the wound although to be fair the entire area here was eschared. We have been working to get a viable surface. We are using Sorbact 6/22; fairly painful wound on the left posterior calf. I am assuming this is been venous he is also a diabetic. His ABI in our clinic was noncompressible however he has easily palpable pulses on his feet. He complains of a lot of pain in the wound also of the left heel and the upper left calf. Some of this may be neuropathy. It is led me to discontinue  Iodoflex reduce his compression but he still complains of pain in fact he says he could not take a debridement today. When I first saw this it was completely necrotic surface we have got it down to something that looks a reasonably healthy I  have been wondering about biopsying this area however he is on Coumadin and with the pain I have put this off. The major question would be an inflammatory ulcer such as pyoderma. The patient is not aware of how this started. He does however have a wound history on both legs. 6/29; patient presents for 1 week follow-up. He has been using Hydrofera Blue under Kerlix/Coban. He has tolerated this well. He currently denies signs of infection. He also declines debridement today. He he states it is painful and does not want to have this done. 7/6; Patient presents for 1 week follow-up. He has been using Hydrofera Blue under Kerlix/Coban. He denies signs of infection. He declines debridement today. 7/15; patient presents for 1 week follow-up. He has been using Hydrofera Blue under Kerlix/Coban. He denies signs of infection today. He is agreeable to having debridement done today. 7/20; patient presents for 1 week follow-up. He is in good spirits today. He has been using Hydrofera Blue under Kerlix/Coban. He denies signs of infection today. 7/27; patient presents for 1 week follow-up. He is in good spirits today. He has no issues or complaints today. He denies signs of infection. 08/12/2020 upon evaluation today patient appears to be doing better in regard to his wound. He has been tolerating the dressing changes without complication. With that being said he did have arterial studies and does show that he has a TBI on the right of 0.90 and a TBI on the left of 0.76 which is excellent. There is no evidence of arterial compromise at this point. 8/17; patient presents for 1 week follow-up. He has no issues or complaints today. He denies signs of infection. 8/24; patient presents for 1 week follow-up. He has no issues or complaints today. He denies signs of infection. He continues to tolerate the compression wrap well. 8/31; patient presents for 1 week follow-up. He has no issues or complaints today. He denies  signs of infection. He is in good spirits today. 9/7; patient presents for 1 week follow-up. He is tolerated the compression wrap well. He has no issues or complaints today. He denies signs of infection. 9/14; patient presents for 1 week follow-up. He has no issues or complaints today. He denies signs of infection. He is in excellent spirits today. 9/21; patient presents for 1 week follow-up. He has no issues or complaints today. 9/28; patient presents for 1 week follow-up. He has tolerated the compression wrap well. He has no issues or complaints today. 10/5; patient presents for 1 week follow-up. He has no issues or complaints today. 10/12; patient presents for 1 week follow-up. He has no issues or complaints today. He denies signs of infection. Shane Bean, Shane Bean ([id number]) Readmission: 04-08-2021 upon evaluation today patient appears to be doing poorly in regard to his right lower extremity. He is being seen for reevaluation here in the clinic he was last discharged healed in October 2022. Subsequently at that time it was noted in the chart that he was told to be wearing compression socks unfortunately the patient tells me he never remembers being told that. I did discuss this with him today as well as Hosie Poisson discussing it with him today again. Obviously he does know at this point once he  is healed he should be wearing compression socks all the time from the time he gets up in the morning to when he goes to sleep at night day in and day out. I think this is the only way that you can to keep things under control. Otherwise his medical history has not changed his arterial study in the past was excellent and did not appear to show any signs of issue at this point which is great news as well. 04-15-2021 upon evaluation today patient's wound is actually looking much drier than what it was last week. Again I do believe the infection is showing signs of improvement although he is having pain for sore I  think this has to do more with the dryness of the wound at this time as opposed to the infection that we were noted in the last week. Fortunately I do not see any evidence of active infection locally or systemically at this time which is great news. 04-22-2021 upon evaluation today patient unfortunately continues to have issues with his leg. This is getting significantly worse despite being on Augmentin. I am actually extremely concerned about worsening infection and especially considering the way the wound looks today and the odor present. 05-04-2021 upon evaluation today patient appears to be doing better with regard to the infection in his leg. He has been tolerating the dressing changes without complication and overall I am extremely pleased with where we stand. There does not appear to be any evidence of active infection locally or systemically which is great news. 05-13-2021 upon evaluation today patient appears to be doing better in regard to his leg ulcer. He has been tolerating the dressing changes without complication. He does have still significant pain he does have a lot of necrotic tissue which is working its way off and there is no signs of significant infection at this point which is great news. With that being said I do believe that the patient does need to likely continue with the wound care measures at this point with regard to the dressing that we are utilizing. 05-20-2021 upon evaluation today patient's wound is actually showing signs of fairly good granulation and epithelization in a lot of areas he still has some slough noted at this point though a lot of the eschar is softening up I do believe the Xeroform has been helpful in this regard. Fortunately I do not see any evidence of active infection locally or systemically which is great news. No fevers, chills, nausea, vomiting, or diarrhea. I do believe the 3 layer compression wrap has been beneficial. 05-27-2021 upon evaluation today  patient's wound is actually showing signs of significant improvement with a lot of new epithelial growth at this point. Fortunately I do not see any signs of active infection locally or systemically which is great news and overall I think we are headed in the right direction. 06-03-2021 upon evaluation today patient appears to be doing well with regard to his wounds although he still has a lot of slough and biofilm buildup noted. We are going to need to try to do some sharp debridement today to clear this away and he is in agreement with allowing me to try what we can. I am not certain how much working to be able to remove or not. 06-10-2021 upon evaluation today patient appears to be doing well with regard to his wound from the overall appearance and size standpoint this is good. From a drainage standpoint and the odor as well this  seems to still be infected in my opinion. I do not think the Augmentin was sufficient. With that being said I discussed this with the patient I do believe that he may benefit from topical [facility] antibiotics in the meantime we will get a use gentamicin to bridge the gap until we get those. 06-17-2021 upon evaluation today patient appears to be doing better in regard to his leg. I have not been able to get him the [facility] as it was too expensive for the topical antibiotics. For that reason would been using gentamicin which I do feel like is helping and I do think that is probably can to be our best bet at this point all things considered. He is in agreement with giving this a trial and seeing how things go. Fortunately I see no evidence of active infection systemically locally some of the odor seems indicate there may be some bacteria present but I am not sure what is best to do past what we are already doing. Electronic Signature(s) Signed: 06/17/2021 2:35:52 PM By: Worthy Keeler PA-C Entered By: Worthy Keeler on 06/17/2021 14:35:52 Shane Bean  ([id number]) -------------------------------------------------------------------------------- Physical Exam Details Patient Name: Shane Bean Date of Service: 06/17/2021 1:45 PM Medical Record Number: [id number] Patient Account Number: [id number] Date of Birth/Sex: August 23, 1955 (65 y.o. M) Treating RN: Levora Dredge Primary Care Provider: Reeves Dam Other Clinician: Referring Provider: Reeves Dam Treating Provider/Extender: Jeri Cos Weeks in Treatment: 63 Constitutional Well-nourished and well-hydrated in no acute distress. Respiratory normal breathing without difficulty. Psychiatric this patient is able to make decisions and demonstrates good insight into disease process. Alert and Oriented x 3. pleasant and cooperative. Notes Upon inspection patient's wound bed actually showed signs of again much more epithelization compared to before and I definitely see evidence of improvement which is great. He still is having significant issues with swelling and edema but at the same time I do feel like that we are slowly improving things with the compression wrapping. We using topical gentamicin and subsequently the Xeroform gauze. Electronic Signature(s) Signed: 06/17/2021 2:36:19 PM By: Worthy Keeler PA-C Entered By: Worthy Keeler on 06/17/2021 14:36:19 Shane Bean ([id number]) -------------------------------------------------------------------------------- Physician Orders Details Patient Name: Shane Bean Date of Service: 06/17/2021 1:45 PM Medical Record Number: [id number] Patient Account Number: [id number] Date of Birth/Sex: 07-07-1955 (65 y.o. M) Treating RN: Levora Dredge Primary Care Provider: Reeves Dam Other Clinician: Referring Provider: Reeves Dam Treating Provider/Extender: Skipper Cliche in Treatment: 10 Verbal / Phone Orders: No Diagnosis Coding ICD-10 Coding Code Description E11.622 Type 2 diabetes mellitus with other skin  ulcer I87.2 Venous insufficiency (chronic) (peripheral) 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 layer exposed L97.812 Non-pressure chronic ulcer of other part of right lower leg with fat layer exposed I10 Essential (primary) hypertension Follow-up Appointments o Return Appointment in 1 week. - weekly wrap o Nurse Visit as needed Bathing/ Shower/ Hygiene o May shower; gently cleanse wound with antibacterial soap, rinse and pat dry prior to dressing wounds o May shower with wound dressing protected with water repellent cover or cast protector. o No tub bath. Anesthetic (Use 'Patient Medications' Section for Anesthetic Order Entry) o Lidocaine applied to wound bed Edema Control - Lymphedema / Segmental Compressive Device / Other o Elevate, Exercise Daily and Avoid Standing for Long Periods of Time. o Elevate legs to the level of the heart and pump ankles as often as possible o Elevate leg(s) parallel to the  floor when sitting. Additional Orders / Instructions o Follow Nutritious Diet and Increase Protein Intake - monitor blood sugar Medications-Please add to medication list. o [facility] Compound Wound Treatment Wound #3 - Lower Leg Wound Laterality: Right, Lateral, Circumferential Cleanser: Soap and Water 1 x Per Week/15 Days Discharge Instructions: Gently cleanse wound with antibacterial soap, rinse and pat dry prior to dressing wounds Cleanser: Wound Cleanser 1 x Per Week/15 Days Discharge Instructions: Wash your hands with soap and water. Remove old dressing, discard into plastic bag and place into trash. Cleanse the wound with Wound Cleanser prior to applying a clean dressing using gauze sponges, not tissues or cotton balls. Do not scrub or use excessive force. Pat dry using gauze sponges, not tissue or cotton balls. Topical: Gentamicin 1 x Per Week/15 Days Discharge Instructions: Apply thin film topically until [facility]  antibiotic recieved Primary Dressing: Xeroform 5x9-HBD (in/in) 1 x Per Week/15 Days Discharge Instructions: Apply Xeroform 5x9-HBD (in/in) as directed Secondary Dressing: ABD Pad 5x9 (in/in) 1 x Per Week/15 Days Discharge Instructions: 2 pads to Cover with ABD pad Compression Wrap: 3-LAYER WRAP - Profore Lite LF 3 Multilayer Compression Bandaging System 1 x Per Week/15 Days Discharge Instructions: Apply 3 multi-layer wrap as prescribed. Shane Bean, Shane Bean ([id number]) Electronic Signature(s) Signed: 06/17/2021 3:08:30 PM By: Levora Dredge Signed: 06/17/2021 4:11:01 PM By: Worthy Keeler PA-C Entered By: Levora Dredge on 06/17/2021 14:31:50 Shane Bean ([id number]) -------------------------------------------------------------------------------- Problem List Details Patient Name: Shane Bean Date of Service: 06/17/2021 1:45 PM Medical Record Number: [id number] Patient Account Number: [id number] Date of Birth/Sex: 11-Jan-1955 (65 y.o. M) Treating RN: Levora Dredge Primary Care Provider: Reeves Dam Other Clinician: Referring Provider: Reeves Dam Treating Provider/Extender: Jeri Cos Weeks in Treatment: 10 Active Problems ICD-10 Encounter Code Description Active Date MDM Diagnosis E11.622 Type 2 diabetes mellitus with other skin ulcer 04/08/2021 No Yes I87.2 Venous insufficiency (chronic) (peripheral) 04/08/2021 No Yes 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 layer 04/08/2021 No Yes exposed L97.812 Non-pressure chronic ulcer of other part of right lower leg with fat layer 04/08/2021 No Yes exposed [place] (primary) hypertension 04/08/2021 No Yes Inactive Problems Resolved Problems Electronic Signature(s) Signed: 06/17/2021 1:54:33 PM By: Worthy Keeler PA-C Entered By: Worthy Keeler on 06/17/2021 13:54:33 Shane Bean  ([id number]) -------------------------------------------------------------------------------- Progress Note Details Patient Name: Shane Bean Date of Service: 06/17/2021 1:45 PM Medical Record Number: [id number] Patient Account Number: [id number] Date of Birth/Sex: 1955-01-28 (65 y.o. M) Treating RN: Levora Dredge Primary Care Provider: Reeves Dam Other Clinician: Referring Provider: Reeves Dam Treating Provider/Extender: Skipper Cliche in Treatment: 10 Subjective Chief Complaint Information obtained from Patient Left LE Ulcer History of Present Illness (HPI) 05/18/2020 upon evaluation today patient appears for initial evaluation here in clinic concerning issues that he has been having with the wound on his left lateral leg. Fortunately there does not appear to be any signs of obvious an active infection at this time which is great news. With that being said the patient unfortunately is continued to have issues with pain although he tells me it is gotten a lot better. He was on Keflex as well as Bactrim DS which seems to have done a good job there. His most recent hemoglobin A1c was 9.8 that was on 04/14/2020. Subsequently I do feel like that the patient is making progress here. He does have a history of chronic venous insufficiency as well as hypertension. I do not see any need for antibiotics  at this point. 5/25; follow-up of the wound on the left posterior calf. This is completely necrotic on the surface. It does not look infected but it is painful. He is a diabetic but I think there is some suggestion here of chronic venous disease as well. We used Iodoflex under compression last week 6/1; again a completely necrotic surface on this with the wound. We have been using Iodoflex under compression he complains of gnawing pain. He has had wounds previously a lot of this looks like chronic venous disease. 6/8; about two thirds of the surface of this wound is still covered in a  black necrotic eschar. I changed him from Iodoflex to [place] last week because of complaints of pain. He actually was seen by her neurologist this weeko Peripheral neuropathy as a cause of the pain and they changed him from Neurontin to Lyrica but he still has not had any relief. He says that most of the pain however is in his heel not in his wound per se. He is a diabetic 6/15. This is a patient with what looks to be a venous wound on the left lateral lower leg. He is also a diabetic. Currently being worked up for diabetic neuropathy. He complains of pain out of proportion to the size of the wound although to be fair the entire area here was eschared. We have been working to get a viable surface. We are using Sorbact 6/22; fairly painful wound on the left posterior calf. I am assuming this is been venous he is also a diabetic. His ABI in our clinic was noncompressible however he has easily palpable pulses on his feet. He complains of a lot of pain in the wound also of the left heel and the upper left calf. Some of this may be neuropathy. It is led me to discontinue Iodoflex reduce his compression but he still complains of pain in fact he says he could not take a debridement today. When I first saw this it was completely necrotic surface we have got it down to something that looks a reasonably healthy I have been wondering about biopsying this area however he is on Coumadin and with the pain I have put this off. The major question would be an inflammatory ulcer such as pyoderma. The patient is not aware of how this started. He does however have a wound history on both legs. 6/29; patient presents for 1 week follow-up. He has been using Hydrofera Blue under Kerlix/Coban. He has tolerated this well. He currently denies signs of infection. He also declines debridement today. He he states it is painful and does not want to have this done. 7/6; Patient presents for 1 week follow-up. He has been using  Hydrofera Blue under Kerlix/Coban. He denies signs of infection. He declines debridement today. 7/15; patient presents for 1 week follow-up. He has been using Hydrofera Blue under Kerlix/Coban. He denies signs of infection today. He is agreeable to having debridement done today. 7/20; patient presents for 1 week follow-up. He is in good spirits today. He has been using Hydrofera Blue under Kerlix/Coban. He denies signs of infection today. 7/27; patient presents for 1 week follow-up. He is in good spirits today. He has no issues or complaints today. He denies signs of infection. 08/12/2020 upon evaluation today patient appears to be doing better in regard to his wound. He has been tolerating the dressing changes without complication. With that being said he did have arterial studies and does show that he has  a TBI on the right of 0.90 and a TBI on the left of 0.76 which is excellent. There is no evidence of arterial compromise at this point. 8/17; patient presents for 1 week follow-up. He has no issues or complaints today. He denies signs of infection. 8/24; patient presents for 1 week follow-up. He has no issues or complaints today. He denies signs of infection. He continues to tolerate the compression wrap well. 8/31; patient presents for 1 week follow-up. He has no issues or complaints today. He denies signs of infection. He is in good spirits today. 9/7; patient presents for 1 week follow-up. He is tolerated the compression wrap well. He has no issues or complaints today. He denies signs of infection. 9/14; patient presents for 1 week follow-up. He has no issues or complaints today. He denies signs of infection. He is in excellent spirits today. 9/21; patient presents for 1 week follow-up. He has no issues or complaints today. Shane Bean, Shane Bean ([id number]) 9/28; patient presents for 1 week follow-up. He has tolerated the compression wrap well. He has no issues or complaints today. 10/5;  patient presents for 1 week follow-up. He has no issues or complaints today. 10/12; patient presents for 1 week follow-up. He has no issues or complaints today. He denies signs of infection. Readmission: 04-08-2021 upon evaluation today patient appears to be doing poorly in regard to his right lower extremity. He is being seen for reevaluation here in the clinic he was last discharged healed in October 2022. Subsequently at that time it was noted in the chart that he was told to be wearing compression socks unfortunately the patient tells me he never remembers being told that. I did discuss this with him today as well as Hosie Poisson discussing it with him today again. Obviously he does know at this point once he is healed he should be wearing compression socks all the time from the time he gets up in the morning to when he goes to sleep at night day in and day out. I think this is the only way that you can to keep things under control. Otherwise his medical history has not changed his arterial study in the past was excellent and did not appear to show any signs of issue at this point which is great news as well. 04-15-2021 upon evaluation today patient's wound is actually looking much drier than what it was last week. Again I do believe the infection is showing signs of improvement although he is having pain for sore I think this has to do more with the dryness of the wound at this time as opposed to the infection that we were noted in the last week. Fortunately I do not see any evidence of active infection locally or systemically at this time which is great news. 04-22-2021 upon evaluation today patient unfortunately continues to have issues with his leg. This is getting significantly worse despite being on Augmentin. I am actually extremely concerned about worsening infection and especially considering the way the wound looks today and the odor present. 05-04-2021 upon evaluation today patient appears to be  doing better with regard to the infection in his leg. He has been tolerating the dressing changes without complication and overall I am extremely pleased with where we stand. There does not appear to be any evidence of active infection locally or systemically which is great news. 05-13-2021 upon evaluation today patient appears to be doing better in regard to his leg ulcer. He has been tolerating  the dressing changes without complication. He does have still significant pain he does have a lot of necrotic tissue which is working its way off and there is no signs of significant infection at this point which is great news. With that being said I do believe that the patient does need to likely continue with the wound care measures at this point with regard to the dressing that we are utilizing. 05-20-2021 upon evaluation today patient's wound is actually showing signs of fairly good granulation and epithelization in a lot of areas he still has some slough noted at this point though a lot of the eschar is softening up I do believe the Xeroform has been helpful in this regard. Fortunately I do not see any evidence of active infection locally or systemically which is great news. No fevers, chills, nausea, vomiting, or diarrhea. I do believe the 3 layer compression wrap has been beneficial. 05-27-2021 upon evaluation today patient's wound is actually showing signs of significant improvement with a lot of new epithelial growth at this point. Fortunately I do not see any signs of active infection locally or systemically which is great news and overall I think we are headed in the right direction. 06-03-2021 upon evaluation today patient appears to be doing well with regard to his wounds although he still has a lot of slough and biofilm buildup noted. We are going to need to try to do some sharp debridement today to clear this away and he is in agreement with allowing me to try what we can. I am not certain how much  working to be able to remove or not. 06-10-2021 upon evaluation today patient appears to be doing well with regard to his wound from the overall appearance and size standpoint this is good. From a drainage standpoint and the odor as well this seems to still be infected in my opinion. I do not think the Augmentin was sufficient. With that being said I discussed this with the patient I do believe that he may benefit from topical Keystone antibiotics in the meantime we will get a use gentamicin to bridge the gap until we get those. 06-17-2021 upon evaluation today patient appears to be doing better in regard to his leg. I have not been able to get him the [facility] as it was too expensive for the topical antibiotics. For that reason would been using gentamicin which I do feel like is helping and I do think that is probably can to be our best bet at this point all things considered. He is in agreement with giving this a trial and seeing how things go. Fortunately I see no evidence of active infection systemically locally some of the odor seems indicate there may be some bacteria present but I am not sure what is best to do past what we are already doing. Objective Constitutional Well-nourished and well-hydrated in no acute distress. Vitals Time Taken: 1:42 PM, Height: 70 in, Weight: 277 lbs, BMI: 39.7, Temperature: 97.6 F, Pulse: 70 bpm, Respiratory Rate: 18 breaths/min, Blood Pressure: 146/76 mmHg. Respiratory normal breathing without difficulty. Shane Bean, Shane Bean ([id number]) Psychiatric this patient is able to make decisions and demonstrates good insight into disease process. Alert and Oriented x 3. pleasant and cooperative. General Notes: Upon inspection patient's wound bed actually showed signs of again much more epithelization compared to before and I definitely see evidence of improvement which is great. He still is having significant issues with swelling and edema but at the same time I  do  feel like that we are slowly improving things with the compression wrapping. We using topical gentamicin and subsequently the Xeroform gauze. Integumentary (Hair, Skin) Wound #3 status is Open. Original cause of wound was Gradually Appeared. The date acquired was: 02/03/2021. The wound has been in treatment 10 weeks. The wound is located on the Right,Lateral,Circumferential Lower Leg. The wound measures 13.5cm length x 13cm width x 0.2cm depth; 137.837cm^2 area and 27.567cm^3 volume. There is Fat Layer (Subcutaneous Tissue) exposed. There is no tunneling or undermining noted. There is a medium amount of serosanguineous drainage noted. Foul odor after cleansing was noted. There is small (1-33%) red, pink, hyper - granulation within the wound bed. There is a large (67-100%) amount of necrotic tissue within the wound bed including Eschar and Adherent Slough. Assessment Active Problems ICD-10 Type 2 diabetes mellitus with other skin ulcer Venous insufficiency (chronic) (peripheral) Non-pressure chronic ulcer of other part of left lower leg with fat layer exposed Non-pressure chronic ulcer of other part of right lower leg with fat layer exposed Essential (primary) hypertension Procedures Wound #3 Pre-procedure diagnosis of Wound #3 is a Diabetic Wound/Ulcer of the Lower Extremity located on the Right,Lateral,Circumferential Lower Leg . There was a Three Layer Compression Therapy Procedure by Levora Dredge, RN. Post procedure Diagnosis Wound #3: Same as Pre-Procedure Plan Follow-up Appointments: Return Appointment in 1 week. - weekly wrap Nurse Visit as needed Bathing/ Shower/ Hygiene: May shower; gently cleanse wound with antibacterial soap, rinse and pat dry prior to dressing wounds May shower with wound dressing protected with water repellent cover or cast protector. No tub bath. Anesthetic (Use 'Patient Medications' Section for Anesthetic Order Entry): Lidocaine applied to wound  bed Edema Control - Lymphedema / Segmental Compressive Device / Other: Elevate, Exercise Daily and Avoid Standing for Long Periods of Time. Elevate legs to the level of the heart and pump ankles as often as possible Elevate leg(s) parallel to the floor when sitting. Additional Orders / Instructions: Follow Nutritious Diet and Increase Protein Intake - monitor blood sugar Medications-Please add to medication list.: Keystone Compound WOUND #3: - Lower Leg Wound Laterality: Right, Lateral, Circumferential Cleanser: Soap and Water 1 x Per Week/15 Days Discharge Instructions: Gently cleanse wound with antibacterial soap, rinse and pat dry prior to dressing wounds Cleanser: Wound Cleanser 1 x Per Week/15 Days Discharge Instructions: Wash your hands with soap and water. Remove old dressing, discard into plastic bag and place into trash. Cleanse the wound with Wound Cleanser prior to applying a clean dressing using gauze sponges, not tissues or cotton balls. Do not scrub or use excessive force. Pat dry using gauze sponges, not tissue or cotton balls. Topical: Gentamicin 1 x Per Week/15 Days Discharge Instructions: Apply thin film topically until [facility] antibiotic recieved Shane Bean, Shane Bean ([id number]) Primary Dressing: Xeroform 5x9-HBD (in/in) 1 x Per Week/15 Days Discharge Instructions: Apply Xeroform 5x9-HBD (in/in) as directed Secondary Dressing: ABD Pad 5x9 (in/in) 1 x Per Week/15 Days Discharge Instructions: 2 pads to Cover with ABD pad Compression Wrap: 3-LAYER WRAP - Profore Lite LF 3 Multilayer Compression Bandaging System 1 x Per Week/15 Days Discharge Instructions: Apply 3 multi-layer wrap as prescribed. 1. I am going to recommend that we go ahead and continue with the wound care measures as before using the gentamicin followed by the Xeroform gauze dressing. 2. I am also can recommend that we have the patient continue with the ABD pads to cover and a 3 layer compression wrap  following. 3. I would also  suggest the patient monitor for any signs of worsening or infection if anything changes he should let me know. We will see patient back for reevaluation in 1 week here in the clinic. If anything worsens or changes patient will contact our office for additional recommendations. Electronic Signature(s) Signed: 06/17/2021 2:36:52 PM By: Worthy Keeler PA-C Entered By: Worthy Keeler on 06/17/2021 14:36:52 Shane Bean ([id number]) -------------------------------------------------------------------------------- SuperBill Details Patient Name: Shane Bean Date of Service: 06/17/2021 Medical Record Number: [id number] Patient Account Number: [id number] Date of Birth/Sex: 06-20-1955 (65 y.o. M) Treating RN: Levora Dredge Primary Care Provider: Reeves Dam Other Clinician: Referring Provider: Reeves Dam Treating Provider/Extender: Jeri Cos Weeks in Treatment: 10 Diagnosis Coding ICD-10 Codes Code Description E11.622 Type 2 diabetes mellitus with other skin ulcer I87.2 Venous insufficiency (chronic) (peripheral) 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 layer exposed L97.812 Non-pressure chronic ulcer of other part of right lower leg with fat layer exposed [place] (primary) hypertension Facility Procedures CPT4 Code: 03009233 Description: (Facility Use Only) [phone] - APPLY MULTLAY COMPRS LWR RT LEG Modifier: Quantity: 1 Physician Procedures CPT4 Code: 3335456 Description: 25638 - WC PHYS LEVEL 4 - EST PT Modifier: Quantity: 1 CPT4 Code: Description: ICD-10 Diagnosis Description E11.622 Type 2 diabetes mellitus with other skin ulcer I87.2 Venous insufficiency (chronic) (peripheral) 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 lay L97.812 Non-pressure  chronic ulcer of other part of right lower leg with fat la Modifier: er exposed yer exposed Quantity: Electronic  Signature(s) Signed: 06/17/2021 2:39:06 PM By: Worthy Keeler PA-C Entered By: Worthy Keeler on 06/17/2021 14:39:06

## 2021-06-17 NOTE — Progress Notes (Signed)
DAMARKO, STITELY ([id number]) Visit Report for 06/17/2021 Arrival Information Details Patient Name: Shane Bean, Shane Bean Date of Service: 06/17/2021 1:45 PM Medical Record Number: [id number] Patient Account Number: [id number] Date of Birth/Sex: [birth year] (65 y.o. M) Treating RN: Levora Dredge Primary Care Tameko Halder: Reeves Dam Other Clinician: Referring Darleny Sem: Reeves Dam Treating Hannie Shoe/Extender: Skipper Cliche in Treatment: 10 Visit Information History Since Last Visit Added or deleted any medications: No Patient Arrived: Ambulatory Any new allergies or adverse reactions: No Arrival Time: 13:41 Had a fall or experienced change in No Accompanied By: self activities of daily living that may affect Transfer Assistance: None risk of falls: Patient Identification Verified: Yes Hospitalized since last visit: No Secondary Verification Process Completed: Yes Has Dressing in Place as Prescribed: Yes Patient Requires Transmission-Based No Has Compression in Place as Prescribed: Yes Precautions: Pain Present Now: Yes Patient Has Alerts: Yes Patient Alerts: Patient on Blood Thinner ABI R Wilson TBI .90 7/22 ABI L 1.46 TBI .76 7/22 Electronic Signature(s) Signed: 06/17/2021 3:08:30 PM By: Levora Dredge Entered By: Levora Dredge on 06/17/2021 13:42:14 Shane Bean ([id number]) -------------------------------------------------------------------------------- Clinic Level of Care Assessment Details Patient Name: Shane Bean Date of Service: 06/17/2021 1:45 PM Medical Record Number: [id number] Patient Account Number: [id number] Date of Birth/Sex: 02-08-1955 (65 y.o. M) Treating RN: Levora Dredge Primary Care Sundee Garland: Reeves Dam Other Clinician: Referring Osmany Azer: Reeves Dam Treating Amarilis Belflower/Extender: Skipper Cliche in Treatment: 10 Clinic Level of Care Assessment Items TOOL 1 Quantity Score _0  - Use when EandM and Procedure is performed on  INITIAL visit 0 ASSESSMENTS - Nursing Assessment / Reassessment _1  - General Physical Exam (combine w/ comprehensive assessment (listed just below) when performed on new 0 pt. evals) _2  - 0 Comprehensive Assessment (HX, ROS, Risk Assessments, Wounds Hx, etc.) ASSESSMENTS - Wound and Skin Assessment / Reassessment _3  - Dermatologic / Skin Assessment (not related to wound area) 0 ASSESSMENTS - Ostomy and/or Continence Assessment and Care _4  - Incontinence Assessment and Management 0 _5  - 0 Ostomy Care Assessment and Management (repouching, etc.) PROCESS - Coordination of Care _6  - Simple Patient / Family Education for ongoing care 0 _7  - 0 Complex (extensive) Patient / Family Education for ongoing care _8  - 0 Staff obtains Programmer, systems, Records, Test Results / Process Orders _9  - 0 Staff telephones HHA, Nursing Homes / Clarify orders / etc _10  - 0 Routine Transfer to another Facility (non-emergent condition) _11  - 0 Routine Hospital Admission (non-emergent condition) _12  - 0 New Admissions / Biomedical engineer / Ordering NPWT, Apligraf, etc. _13  - 0 Emergency Hospital Admission (emergent condition) PROCESS - Special Needs _14  - Pediatric / Minor Patient Management 0 _15  - 0 Isolation Patient Management _16  - 0 Hearing / Language / Visual special needs _17  - 0 Assessment of Community assistance (transportation, D/C planning, etc.) _18  - 0 Additional assistance / Altered mentation _19  - 0 Support Surface(s) Assessment (bed, cushion, seat, etc.) INTERVENTIONS - Miscellaneous _20  - External ear exam 0 _21  - 0 Patient Transfer (multiple staff / Civil Service fast streamer / Similar devices) _22  - 0 Simple Staple / Suture removal (25 or less) _23  - 0 Complex Staple / Suture removal (26 or more) _24  - 0 Hypo/Hyperglycemic Management (do not check if billed separately) _25  - 0 Ankle / Brachial Index (ABI) - do not check if billed separately Has the patient been seen at the hospital within the last three  years: Yes Total Score: 0 Level Of Care: ____ Shane Bean ([id number]) Electronic Signature(s) Signed: 06/17/2021 3:08:30  PM By: Levora Dredge Entered By: Levora Dredge on 06/17/2021 14:31:57 Shane Bean ([id number]) -------------------------------------------------------------------------------- Compression Therapy Details Patient Name: Shane Bean Date of Service: 06/17/2021 1:45 PM Medical Record Number: [id number] Patient Account Number: [id number] Date of Birth/Sex: 09-28-1955 (65 y.o. M) Treating RN: Levora Dredge Primary Care Jenniferlynn Saad: Reeves Dam Other Clinician: Referring Palyn Scrima: Reeves Dam Treating Ghadeer Kastelic/Extender: Jeri Cos Weeks in Treatment: 10 Compression Therapy Performed for Wound Assessment: Wound #3 Right,Lateral,Circumferential Lower Leg Performed By: Clinician Levora Dredge, RN Compression Type: Three Layer Post Procedure Diagnosis Same as Pre-procedure Electronic Signature(s) Signed: 06/17/2021 3:08:30 PM By: Levora Dredge Entered By: Levora Dredge on 06/17/2021 14:17:33 Shane Bean ([id number]) -------------------------------------------------------------------------------- Encounter Discharge Information Details Patient Name: Shane Bean Date of Service: 06/17/2021 1:45 PM Medical Record Number: [id number] Patient Account Number: [id number] Date of Birth/Sex: [birth year] (65 y.o. M) Treating RN: Levora Dredge Primary Care Lache Dagher: Reeves Dam Other Clinician: Referring Blanch Stang: Reeves Dam Treating Anfernee Peschke/Extender: Skipper Cliche in Treatment: 10 Encounter Discharge Information Items Discharge Condition: Stable Ambulatory Status: Ambulatory Discharge Destination: Home Transportation: Private Auto Accompanied By: self Schedule Follow-up Appointment: Yes Clinical Summary of Care: Electronic Signature(s) Signed: 06/17/2021 3:08:30 PM By: Levora Dredge Entered By: Levora Dredge on  06/17/2021 14:33:03 Shane Bean ([id number]) -------------------------------------------------------------------------------- Lower Extremity Assessment Details Patient Name: Shane Bean Date of Service: 06/17/2021 1:45 PM Medical Record Number: [id number] Patient Account Number: [id number] Date of Birth/Sex: 1955-03-10 (65 y.o. M) Treating RN: Levora Dredge Primary Care Colman Birdwell: Reeves Dam Other Clinician: Referring Nhat Hearne: Reeves Dam Treating Laurene Melendrez/Extender: Jeri Cos Weeks in Treatment: 10 Edema Assessment Assessed: [Left: No] [Right: No] Edema: [Left: Ye] [Right: s] Calf Left: Right: Point of Measurement: 31 cm From Medial Instep 45.2 cm Ankle Left: Right: Point of Measurement: 10 cm From Medial Instep 25.7 cm Vascular Assessment Pulses: Dorsalis Pedis Palpable: [Right:Yes] Electronic Signature(s) Signed: 06/17/2021 3:08:30 PM By: Levora Dredge Entered By: Levora Dredge on 06/17/2021 13:54:01 Shane Bean ([id number]) -------------------------------------------------------------------------------- Multi Wound Chart Details Patient Name: Shane Bean Date of Service: 06/17/2021 1:45 PM Medical Record Number: [id number] Patient Account Number: [id number] Date of Birth/Sex: Oct 10, 1955 (65 y.o. M) Treating RN: Levora Dredge Primary Care Zyriah Mask: Reeves Dam Other Clinician: Referring Ortencia Askari: Reeves Dam Treating Cobey Raineri/Extender: Jeri Cos Weeks in Treatment: 10 Vital Signs Height(in): 70 Pulse(bpm): 70 Weight(lbs): 277 Blood Pressure(mmHg): 146/76 Body Mass Index(BMI): 39.7 Temperature(F): 97.6 Respiratory Rate(breaths/min): 18 Photos: [N/A:N/A] Wound Location: Right, Lateral, Circumferential N/A N/A Lower Leg Wounding Event: Gradually Appeared N/A N/A Primary Etiology: Diabetic Wound/Ulcer of the Lower N/A N/A Extremity Comorbid History: Hypertension, Type II Diabetes, N/A N/A Neuropathy Date Acquired:  02/03/2021 N/A N/A Weeks of Treatment: 10 N/A N/A Wound Status: Open N/A N/A Wound Recurrence: No N/A N/A Measurements L x W x D (cm) 13.5x13x0.2 N/A N/A Area (cm) : 379.024 N/A N/A Volume (cm) : 27.567 N/A N/A % Reduction in Area: -92.90% N/A N/A % Reduction in Volume: -285.70% N/A N/A Classification: Grade 2 N/A N/A Exudate Amount: Medium N/A N/A Exudate Type: Serosanguineous N/A N/A Exudate Color: red, brown N/A N/A Foul Odor After Cleansing: Yes N/A N/A Odor Anticipated Due to Product No N/A N/A Use: Granulation Amount: Small (1-33%) N/A N/A Granulation Quality: Red, Pink, Hyper-granulation N/A N/A Necrotic Amount: Large (67-100%) N/A N/A Necrotic Tissue: Eschar, Adherent Slough N/A N/A Exposed Structures: Fat Layer (Subcutaneous Tissue): N/A N/A Yes Fascia: No Tendon: No Muscle: No Denis, Brason V. ([id number]) Joint: No Bone: No Epithelialization: Small (1-33%) N/A N/A Treatment Notes Electronic Signature(s) Signed:  06/17/2021 3:08:30 PM By: Levora Dredge Entered By: Levora Dredge on 06/17/2021 13:54:19 Shane Bean ([id number]) -------------------------------------------------------------------------------- Multi-Disciplinary Care Plan Details Patient Name: Shane Bean Date of Service: 06/17/2021 1:45 PM Medical Record Number: [id number] Patient Account Number: [id number] Date of Birth/Sex: 1955/03/06 (65 y.o. M) Treating RN: Levora Dredge Primary Care Eutimio Gharibian: Reeves Dam Other Clinician: Referring Trevone Prestwood: Reeves Dam Treating Dillan Lunden/Extender: Skipper Cliche in Treatment: 10 Active Inactive Necrotic Tissue Nursing Diagnoses: Impaired tissue integrity related to necrotic/devitalized tissue Knowledge deficit related to management of necrotic/devitalized tissue Goals: Necrotic/devitalized tissue will be minimized in the wound bed Date Initiated: 04/15/2021 Target Resolution Date: 04/15/2021 Goal Status: Active Patient/caregiver  will verbalize understanding of reason and process for debridement of necrotic tissue Date Initiated: 04/15/2021 Date Inactivated: 05/20/2021 Target Resolution Date: 04/15/2021 Goal Status: Met Interventions: Assess patient pain level pre-, during and post procedure and prior to discharge Provide education on necrotic tissue and debridement process Notes: Wound/Skin Impairment Nursing Diagnoses: Knowledge deficit related to ulceration/compromised skin integrity Goals: Patient/caregiver will verbalize understanding of skin care regimen Date Initiated: 04/08/2021 Date Inactivated: 05/04/2021 Target Resolution Date: 05/08/2021 Goal Status: Met Ulcer/skin breakdown will have a volume reduction of 30% by week 4 Date Initiated: 04/08/2021 Target Resolution Date: 06/08/2021 Goal Status: Active Ulcer/skin breakdown will have a volume reduction of 50% by week 8 Date Initiated: 04/08/2021 Target Resolution Date: 07/08/2021 Goal Status: Active Ulcer/skin breakdown will have a volume reduction of 80% by week 12 Date Initiated: 04/08/2021 Target Resolution Date: 08/08/2021 Goal Status: Active Ulcer/skin breakdown will heal within 14 weeks Date Initiated: 04/08/2021 Target Resolution Date: 09/08/2021 Goal Status: Active Interventions: Assess patient/caregiver ability to obtain necessary supplies Assess patient/caregiver ability to perform ulcer/skin care regimen upon admission and as needed Assess ulceration(s) every visit Notes: Electronic Signature(s) Signed: 06/17/2021 3:08:30 PM By: Levora Dredge Entered By: Levora Dredge on 06/17/2021 [place], Virgil Benedict ([id number]) Theda Sers, Virgil Benedict ([id number]) -------------------------------------------------------------------------------- Pain Assessment Details Patient Name: Shane Bean Date of Service: 06/17/2021 1:45 PM Medical Record Number: [id number] Patient Account Number: [id number] Date of Birth/Sex: [birth year] (66 y.o. M) Treating RN:  Levora Dredge Primary Care Nakea Gouger: Reeves Dam Other Clinician: Referring Kaipo Ardis: Reeves Dam Treating Rosaly Labarbera/Extender: Skipper Cliche in Treatment: 10 Active Problems Location of Pain Severity and Description of Pain Patient Has Paino Yes Site Locations Rate the pain. Current Pain Level: 4 Pain Management and Medication Current Pain Management: Notes pt states pain in RLL Electronic Signature(s) Signed: 06/17/2021 3:08:30 PM By: Levora Dredge Entered By: Levora Dredge on 06/17/2021 13:42:45 Shane Bean ([id number]) -------------------------------------------------------------------------------- Patient/Caregiver Education Details Patient Name: Shane Bean Date of Service: 06/17/2021 1:45 PM Medical Record Number: [id number] Patient Account Number: [id number] Date of Birth/Gender: 1955-04-29 (65 y.o. M) Treating RN: Levora Dredge Primary Care Physician: Reeves Dam Other Clinician: Referring Physician: Reeves Dam Treating Physician/Extender: Skipper Cliche in Treatment: 10 Education Assessment Education Provided To: Patient Education Topics Provided Wound/Skin Impairment: Handouts: Caring for Your Ulcer Methods: Explain/Verbal Responses: State content correctly Electronic Signature(s) Signed: 06/17/2021 3:08:30 PM By: Levora Dredge Entered By: Levora Dredge on 06/17/2021 14:32:34 Shane Bean ([id number]) -------------------------------------------------------------------------------- Wound Assessment Details Patient Name: Shane Bean Date of Service: 06/17/2021 1:45 PM Medical Record Number: [id number] Patient Account Number: [id number] Date of Birth/Sex: 10/03/1955 (65 y.o. M) Treating RN: Levora Dredge Primary Care Yareliz Thorstenson: Reeves Dam Other Clinician: Referring Thiago Ragsdale: Reeves Dam Treating Katelyne Galster/Extender: Jeri Cos Weeks in Treatment: 10 Wound Status Wound Number: 3 Primary Etiology:  Diabetic Wound/Ulcer of the Lower Extremity  Wound Location: Right, Lateral, Circumferential Lower Leg Wound Status: Open Wounding Event: Gradually Appeared Comorbid History: Hypertension, Type II Diabetes, Neuropathy Date Acquired: 02/03/2021 Weeks Of Treatment: 10 Clustered Wound: No Photos Wound Measurements Length: (cm) 13.5 Width: (cm) 13 Depth: (cm) 0.2 Area: (cm) 137.837 Volume: (cm) 27.567 % Reduction in Area: -92.9% % Reduction in Volume: -285.7% Epithelialization: Small (1-33%) Tunneling: No Undermining: No Wound Description Classification: Grade 2 Exudate Amount: Medium Exudate Type: Serosanguineous Exudate Color: red, brown Foul Odor After Cleansing: Yes Due to Product Use: No Slough/Fibrino No Wound Bed Granulation Amount: Small (1-33%) Exposed Structure Granulation Quality: Red, Pink, Hyper-granulation Fascia Exposed: No Necrotic Amount: Large (67-100%) Fat Layer (Subcutaneous Tissue) Exposed: Yes Necrotic Quality: Eschar, Adherent Slough Tendon Exposed: No Muscle Exposed: No Joint Exposed: No Bone Exposed: No Treatment Notes Wound #3 (Lower Leg) Wound Laterality: Right, Lateral, Circumferential Cleanser Soap and Water Discharge Instruction: Gently cleanse wound with antibacterial soap, rinse and pat dry prior to dressing wounds Wound Cleanser Discharge Instruction: Wash your hands with soap and water. Remove old dressing, discard into plastic bag and place into trash. Cleanse the wound with Wound Cleanser prior to applying a clean dressing using gauze sponges, not tissues or cotton balls. Do not Okane, Filmore V. ([id number]) scrub or use excessive force. Pat dry using gauze sponges, not tissue or cotton balls. Peri-Wound Care Topical Gentamicin Discharge Instruction: Apply thin film topically until [facility] antibiotic recieved Primary Dressing Xeroform 5x9-HBD (in/in) Discharge Instruction: Apply Xeroform 5x9-HBD (in/in) as directed Secondary  Dressing ABD Pad 5x9 (in/in) Discharge Instruction: 2 pads to Cover with ABD pad Secured With Compression Wrap 3-LAYER WRAP - Profore Lite LF 3 Multilayer Compression Bandaging System Discharge Instruction: Apply 3 multi-layer wrap as prescribed. Compression Stockings Add-Ons Electronic Signature(s) Signed: 06/17/2021 3:08:30 PM By: Levora Dredge Entered By: Levora Dredge on 06/17/2021 13:53:39 Shane Bean ([id number]) -------------------------------------------------------------------------------- Vitals Details Patient Name: Shane Bean Date of Service: 06/17/2021 1:45 PM Medical Record Number: [id number] Patient Account Number: [id number] Date of Birth/Sex: September 09, 1955 (65 y.o. M) Treating RN: Levora Dredge Primary Care Dewitt Judice: Reeves Dam Other Clinician: Referring Ruthanna Macchia: Reeves Dam Treating Brenna Friesenhahn/Extender: Jeri Cos Weeks in Treatment: 10 Vital Signs Time Taken: 13:42 Temperature (F): 97.6 Height (in): 70 Pulse (bpm): 70 Weight (lbs): 277 Respiratory Rate (breaths/min): 18 Body Mass Index (BMI): 39.7 Blood Pressure (mmHg): 146/76 Reference Range: 80 - 120 mg / dl Electronic Signature(s) Signed: 06/17/2021 3:08:30 PM By: Levora Dredge Entered By: Levora Dredge on 06/17/2021 13:42:30

## 2021-06-24 ENCOUNTER — Encounter: Payer: Medicare (Managed Care) | Admitting: Physician Assistant

## 2021-06-24 DIAGNOSIS — E11622 Type 2 diabetes mellitus with other skin ulcer: Secondary | ICD-10-CM | POA: Diagnosis not present

## 2021-06-24 NOTE — Progress Notes (Signed)
DEUNDRE, THONG ([id number]) Visit Report for 06/24/2021 Chief Complaint Document Details Patient Name: Shane Bean, Shane Bean Date of Service: 06/24/2021 1:30 PM Medical Record Number: [id number] Patient Account Number: [id number] Date of Birth/Sex: 1955-11-07 (66 y.o. M) Treating RN: Cornell Barman Primary Care Provider: Reeves Dam Other Clinician: Referring Provider: Reeves Dam Treating Provider/Extender: Skipper Cliche in Treatment: 11 Information Obtained from: Patient Chief Complaint Left LE Ulcer Electronic Signature(s) Signed: 06/24/2021 2:16:50 PM By: Worthy Keeler PA-C Entered By: Worthy Keeler on 06/24/2021 14:16:50 Shane Bean ([id number]) -------------------------------------------------------------------------------- HPI Details Patient Name: Shane Bean Date of Service: 06/24/2021 1:30 PM Medical Record Number: [id number] Patient Account Number: [id number] Date of Birth/Sex: 03-08-1955 (65 y.o. M) Treating RN: Cornell Barman Primary Care Provider: Reeves Dam Other Clinician: Referring Provider: Reeves Dam Treating Provider/Extender: Skipper Cliche in Treatment: 11 History of Present Illness HPI Description: 05/18/2020 upon evaluation today patient appears for initial evaluation here in clinic concerning issues that he has been having with the wound on his left lateral leg. Fortunately there does not appear to be any signs of obvious an active infection at this time which is great news. With that being said the patient unfortunately is continued to have issues with pain although he tells me it is gotten a lot better. He was on Keflex as well as Bactrim DS which seems to have done a good job there. His most recent hemoglobin A1c was 9.8 that was on 04/14/2020. Subsequently I do feel like that the patient is making progress here. He does have a history of chronic venous insufficiency as well as hypertension. I do not see any need for antibiotics at  this point. 5/25; follow-up of the wound on the left posterior calf. This is completely necrotic on the surface. It does not look infected but it is painful. He is a diabetic but I think there is some suggestion here of chronic venous disease as well. We used Iodoflex under compression last week 6/1; again a completely necrotic surface on this with the wound. We have been using Iodoflex under compression he complains of gnawing pain. He has had wounds previously a lot of this looks like chronic venous disease. 6/8; about two thirds of the surface of this wound is still covered in a black necrotic eschar. I changed him from Iodoflex to [place] last week because of complaints of pain. He actually was seen by her neurologist this weeko Peripheral neuropathy as a cause of the pain and they changed him from Neurontin to Lyrica but he still has not had any relief. He says that most of the pain however is in his heel not in his wound per se. He is a diabetic 6/15. This is a patient with what looks to be a venous wound on the left lateral lower leg. He is also a diabetic. Currently being worked up for diabetic neuropathy. He complains of pain out of proportion to the size of the wound although to be fair the entire area here was eschared. We have been working to get a viable surface. We are using Sorbact 6/22; fairly painful wound on the left posterior calf. I am assuming this is been venous he is also a diabetic. His ABI in our clinic was noncompressible however he has easily palpable pulses on his feet. He complains of a lot of pain in the wound also of the left heel and the upper left calf. Some of this may be neuropathy. It is led me to discontinue  Iodoflex reduce his compression but he still complains of pain in fact he says he could not take a debridement today. When I first saw this it was completely necrotic surface we have got it down to something that looks a reasonably healthy I have been  wondering about biopsying this area however he is on Coumadin and with the pain I have put this off. The major question would be an inflammatory ulcer such as pyoderma. The patient is not aware of how this started. He does however have a wound history on both legs. 6/29; patient presents for 1 week follow-up. He has been using Hydrofera Blue under Kerlix/Coban. He has tolerated this well. He currently denies signs of infection. He also declines debridement today. He he states it is painful and does not want to have this done. 7/6; Patient presents for 1 week follow-up. He has been using Hydrofera Blue under Kerlix/Coban. He denies signs of infection. He declines debridement today. 7/15; patient presents for 1 week follow-up. He has been using Hydrofera Blue under Kerlix/Coban. He denies signs of infection today. He is agreeable to having debridement done today. 7/20; patient presents for 1 week follow-up. He is in good spirits today. He has been using Hydrofera Blue under Kerlix/Coban. He denies signs of infection today. 7/27; patient presents for 1 week follow-up. He is in good spirits today. He has no issues or complaints today. He denies signs of infection. 08/12/2020 upon evaluation today patient appears to be doing better in regard to his wound. He has been tolerating the dressing changes without complication. With that being said he did have arterial studies and does show that he has a TBI on the right of 0.90 and a TBI on the left of 0.76 which is excellent. There is no evidence of arterial compromise at this point. 8/17; patient presents for 1 week follow-up. He has no issues or complaints today. He denies signs of infection. 8/24; patient presents for 1 week follow-up. He has no issues or complaints today. He denies signs of infection. He continues to tolerate the compression wrap well. 8/31; patient presents for 1 week follow-up. He has no issues or complaints today. He denies signs of  infection. He is in good spirits today. 9/7; patient presents for 1 week follow-up. He is tolerated the compression wrap well. He has no issues or complaints today. He denies signs of infection. 9/14; patient presents for 1 week follow-up. He has no issues or complaints today. He denies signs of infection. He is in excellent spirits today. 9/21; patient presents for 1 week follow-up. He has no issues or complaints today. 9/28; patient presents for 1 week follow-up. He has tolerated the compression wrap well. He has no issues or complaints today. 10/5; patient presents for 1 week follow-up. He has no issues or complaints today. 10/12; patient presents for 1 week follow-up. He has no issues or complaints today. He denies signs of infection. Shane Bean, Shane Bean ([id number]) Readmission: 04-08-2021 upon evaluation today patient appears to be doing poorly in regard to his right lower extremity. He is being seen for reevaluation here in the clinic he was last discharged healed in October 2022. Subsequently at that time it was noted in the chart that he was told to be wearing compression socks unfortunately the patient tells me he never remembers being told that. I did discuss this with him today as well as Hosie Poisson discussing it with him today again. Obviously he does know at this point once he  is healed he should be wearing compression socks all the time from the time he gets up in the morning to when he goes to sleep at night day in and day out. I think this is the only way that you can to keep things under control. Otherwise his medical history has not changed his arterial study in the past was excellent and did not appear to show any signs of issue at this point which is great news as well. 04-15-2021 upon evaluation today patient's wound is actually looking much drier than what it was last week. Again I do believe the infection is showing signs of improvement although he is having pain for sore I think  this has to do more with the dryness of the wound at this time as opposed to the infection that we were noted in the last week. Fortunately I do not see any evidence of active infection locally or systemically at this time which is great news. 04-22-2021 upon evaluation today patient unfortunately continues to have issues with his leg. This is getting significantly worse despite being on Augmentin. I am actually extremely concerned about worsening infection and especially considering the way the wound looks today and the odor present. 05-04-2021 upon evaluation today patient appears to be doing better with regard to the infection in his leg. He has been tolerating the dressing changes without complication and overall I am extremely pleased with where we stand. There does not appear to be any evidence of active infection locally or systemically which is great news. 05-13-2021 upon evaluation today patient appears to be doing better in regard to his leg ulcer. He has been tolerating the dressing changes without complication. He does have still significant pain he does have a lot of necrotic tissue which is working its way off and there is no signs of significant infection at this point which is great news. With that being said I do believe that the patient does need to likely continue with the wound care measures at this point with regard to the dressing that we are utilizing. 05-20-2021 upon evaluation today patient's wound is actually showing signs of fairly good granulation and epithelization in a lot of areas he still has some slough noted at this point though a lot of the eschar is softening up I do believe the Xeroform has been helpful in this regard. Fortunately I do not see any evidence of active infection locally or systemically which is great news. No fevers, chills, nausea, vomiting, or diarrhea. I do believe the 3 layer compression wrap has been beneficial. 05-27-2021 upon evaluation today  patient's wound is actually showing signs of significant improvement with a lot of new epithelial growth at this point. Fortunately I do not see any signs of active infection locally or systemically which is great news and overall I think we are headed in the right direction. 06-03-2021 upon evaluation today patient appears to be doing well with regard to his wounds although he still has a lot of slough and biofilm buildup noted. We are going to need to try to do some sharp debridement today to clear this away and he is in agreement with allowing me to try what we can. I am not certain how much working to be able to remove or not. 06-10-2021 upon evaluation today patient appears to be doing well with regard to his wound from the overall appearance and size standpoint this is good. From a drainage standpoint and the odor as well this  seems to still be infected in my opinion. I do not think the Augmentin was sufficient. With that being said I discussed this with the patient I do believe that he may benefit from topical [facility] antibiotics in the meantime we will get a use gentamicin to bridge the gap until we get those. 06-17-2021 upon evaluation today patient appears to be doing better in regard to his leg. I have not been able to get him the [facility] as it was too expensive for the topical antibiotics. For that reason would been using gentamicin which I do feel like is helping and I do think that is probably can to be our best bet at this point all things considered. He is in agreement with giving this a trial and seeing how things go. Fortunately I see no evidence of active infection systemically locally some of the odor seems indicate there may be some bacteria present but I am not sure what is best to do past what we are already doing. 06-24-2021 upon evaluation today patient appears to be doing well with regard to his healing in fact he is got more epithelial growth than what we have seen previous.  Fortunately I think that each week I see him this is looking better and better. Fortunately I see no signs of active infection at this time locally or systemically which is great news. Electronic Signature(s) Signed: 06/24/2021 2:26:53 PM By: Worthy Keeler PA-C Entered By: Worthy Keeler on 06/24/2021 14:26:53 Shane Bean ([id number]) -------------------------------------------------------------------------------- Physical Exam Details Patient Name: Shane Bean Date of Service: 06/24/2021 1:30 PM Medical Record Number: [id number] Patient Account Number: [id number] Date of Birth/Sex: 1956/01/04 (65 y.o. M) Treating RN: Cornell Barman Primary Care Provider: Reeves Dam Other Clinician: Referring Provider: Reeves Dam Treating Provider/Extender: Jeri Cos Weeks in Treatment: 41 Constitutional Well-nourished and well-hydrated in no acute distress. Respiratory normal breathing without difficulty. Psychiatric this patient is able to make decisions and demonstrates good insight into disease process. Alert and Oriented x 3. pleasant and cooperative. Notes Upon inspection patient's wound bed showed evidence of good granulation and epithelization at this point. Fortunately I do believe that we are making progress area still draining a lot but much less than it was and overall I still think that each time I see him there is more new tissue growth. Electronic Signature(s) Signed: 06/24/2021 2:27:30 PM By: Worthy Keeler PA-C Entered By: Worthy Keeler on 06/24/2021 14:27:30 Shane Bean ([id number]) -------------------------------------------------------------------------------- Physician Orders Details Patient Name: Shane Bean Date of Service: 06/24/2021 1:30 PM Medical Record Number: [id number] Patient Account Number: [id number] Date of Birth/Sex: 1955-07-10 (65 y.o. M) Treating RN: Cornell Barman Primary Care Provider: Reeves Dam Other Clinician: Referring  Provider: Reeves Dam Treating Provider/Extender: Skipper Cliche in Treatment: 11 Verbal / Phone Orders: No Diagnosis Coding ICD-10 Coding Code Description E11.622 Type 2 diabetes mellitus with other skin ulcer I87.2 Venous insufficiency (chronic) (peripheral) 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 layer exposed L97.812 Non-pressure chronic ulcer of other part of right lower leg with fat layer exposed I10 Essential (primary) hypertension Follow-up Appointments o Return Appointment in 1 week. - weekly wrap o Nurse Visit as needed Bathing/ Shower/ Hygiene o May shower; gently cleanse wound with antibacterial soap, rinse and pat dry prior to dressing wounds o May shower with wound dressing protected with water repellent cover or cast protector. o No tub bath. Anesthetic (Use 'Patient Medications' Section for Anesthetic Order Entry)   o Lidocaine applied to wound bed Edema Control - Lymphedema / Segmental Compressive Device / Other o Elevate, Exercise Daily and Avoid Standing for Long Periods of Time. o Elevate legs to the level of the heart and pump ankles as often as possible o Elevate leg(s) parallel to the floor when sitting. Additional Orders / Instructions o Follow Nutritious Diet and Increase Protein Intake - monitor blood sugar Medications-Please add to medication list. o [facility] Compound Wound Treatment Wound #3 - Lower Leg Wound Laterality: Right, Lateral, Circumferential Cleanser: Soap and Water 1 x Per Week/15 Days Discharge Instructions: Gently cleanse wound with antibacterial soap, rinse and pat dry prior to dressing wounds Cleanser: Wound Cleanser 1 x Per Week/15 Days Discharge Instructions: Wash your hands with soap and water. Remove old dressing, discard into plastic bag and place into trash. Cleanse the wound with Wound Cleanser prior to applying a clean dressing using gauze sponges, not tissues or cotton  balls. Do not scrub or use excessive force. Pat dry using gauze sponges, not tissue or cotton balls. Topical: Gentamicin 1 x Per Week/15 Days Discharge Instructions: Apply thin film topically until [facility] antibiotic recieved Primary Dressing: Xeroform 5x9-HBD (in/in) 1 x Per Week/15 Days Discharge Instructions: Apply Xeroform 5x9-HBD (in/in) as directed Secondary Dressing: ABD Pad 5x9 (in/in) 1 x Per Week/15 Days Discharge Instructions: 2 pads to Cover with ABD pad Compression Wrap: 3-LAYER WRAP - Profore Lite LF 3 Multilayer Compression Bandaging System 1 x Per Week/15 Days Discharge Instructions: Apply 3 multi-layer wrap as prescribed. Shane Bean, Shane Bean ([id number]) Electronic Signature(s) Signed: 06/24/2021 4:35:11 PM By: Massie Kluver Signed: 06/24/2021 5:14:12 PM By: Worthy Keeler PA-C Entered By: Massie Kluver on 06/24/2021 14:45:44 Shane Bean ([id number]) -------------------------------------------------------------------------------- Problem List Details Patient Name: Shane Bean Date of Service: 06/24/2021 1:30 PM Medical Record Number: [id number] Patient Account Number: [id number] Date of Birth/Sex: Aug 04, 1955 (65 y.o. M) Treating RN: Cornell Barman Primary Care Provider: Reeves Dam Other Clinician: Referring Provider: Reeves Dam Treating Provider/Extender: Skipper Cliche in Treatment: 11 Active Problems ICD-10 Encounter Code Description Active Date MDM Diagnosis E11.622 Type 2 diabetes mellitus with other skin ulcer 04/08/2021 No Yes I87.2 Venous insufficiency (chronic) (peripheral) 04/08/2021 No Yes 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 layer 04/08/2021 No Yes exposed L97.812 Non-pressure chronic ulcer of other part of right lower leg with fat layer 04/08/2021 No Yes exposed [place] (primary) hypertension 04/08/2021 No Yes Inactive Problems Resolved Problems Electronic Signature(s) Signed: 06/24/2021 2:16:39  PM By: Worthy Keeler PA-C Entered By: Worthy Keeler on 06/24/2021 14:16:39 Shane Bean ([id number]) -------------------------------------------------------------------------------- Progress Note Details Patient Name: Shane Bean Date of Service: 06/24/2021 1:30 PM Medical Record Number: [id number] Patient Account Number: [id number] Date of Birth/Sex: 1955/06/12 (65 y.o. M) Treating RN: Cornell Barman Primary Care Provider: Reeves Dam Other Clinician: Referring Provider: Reeves Dam Treating Provider/Extender: Skipper Cliche in Treatment: 11 Subjective Chief Complaint Information obtained from Patient Left LE Ulcer History of Present Illness (HPI) 05/18/2020 upon evaluation today patient appears for initial evaluation here in clinic concerning issues that he has been having with the wound on his left lateral leg. Fortunately there does not appear to be any signs of obvious an active infection at this time which is great news. With that being said the patient unfortunately is continued to have issues with pain although he tells me it is gotten a lot better. He was on Keflex as well as Bactrim DS which seems to  have done a good job there. His most recent hemoglobin A1c was 9.8 that was on 04/14/2020. Subsequently I do feel like that the patient is making progress here. He does have a history of chronic venous insufficiency as well as hypertension. I do not see any need for antibiotics at this point. 5/25; follow-up of the wound on the left posterior calf. This is completely necrotic on the surface. It does not look infected but it is painful. He is a diabetic but I think there is some suggestion here of chronic venous disease as well. We used Iodoflex under compression last week 6/1; again a completely necrotic surface on this with the wound. We have been using Iodoflex under compression he complains of gnawing pain. He has had wounds previously a lot of this looks like  chronic venous disease. 6/8; about two thirds of the surface of this wound is still covered in a black necrotic eschar. I changed him from Iodoflex to [place] last week because of complaints of pain. He actually was seen by her neurologist this weeko Peripheral neuropathy as a cause of the pain and they changed him from Neurontin to Lyrica but he still has not had any relief. He says that most of the pain however is in his heel not in his wound per se. He is a diabetic 6/15. This is a patient with what looks to be a venous wound on the left lateral lower leg. He is also a diabetic. Currently being worked up for diabetic neuropathy. He complains of pain out of proportion to the size of the wound although to be fair the entire area here was eschared. We have been working to get a viable surface. We are using Sorbact 6/22; fairly painful wound on the left posterior calf. I am assuming this is been venous he is also a diabetic. His ABI in our clinic was noncompressible however he has easily palpable pulses on his feet. He complains of a lot of pain in the wound also of the left heel and the upper left calf. Some of this may be neuropathy. It is led me to discontinue Iodoflex reduce his compression but he still complains of pain in fact he says he could not take a debridement today. When I first saw this it was completely necrotic surface we have got it down to something that looks a reasonably healthy I have been wondering about biopsying this area however he is on Coumadin and with the pain I have put this off. The major question would be an inflammatory ulcer such as pyoderma. The patient is not aware of how this started. He does however have a wound history on both legs. 6/29; patient presents for 1 week follow-up. He has been using Hydrofera Blue under Kerlix/Coban. He has tolerated this well. He currently denies signs of infection. He also declines debridement today. He he states it is painful and  does not want to have this done. 7/6; Patient presents for 1 week follow-up. He has been using Hydrofera Blue under Kerlix/Coban. He denies signs of infection. He declines debridement today. 7/15; patient presents for 1 week follow-up. He has been using Hydrofera Blue under Kerlix/Coban. He denies signs of infection today. He is agreeable to having debridement done today. 7/20; patient presents for 1 week follow-up. He is in good spirits today. He has been using Hydrofera Blue under Kerlix/Coban. He denies signs of infection today. 7/27; patient presents for 1 week follow-up. He is in good spirits today. He  has no issues or complaints today. He denies signs of infection. 08/12/2020 upon evaluation today patient appears to be doing better in regard to his wound. He has been tolerating the dressing changes without complication. With that being said he did have arterial studies and does show that he has a TBI on the right of 0.90 and a TBI on the left of 0.76 which is excellent. There is no evidence of arterial compromise at this point. 8/17; patient presents for 1 week follow-up. He has no issues or complaints today. He denies signs of infection. 8/24; patient presents for 1 week follow-up. He has no issues or complaints today. He denies signs of infection. He continues to tolerate the compression wrap well. 8/31; patient presents for 1 week follow-up. He has no issues or complaints today. He denies signs of infection. He is in good spirits today. 9/7; patient presents for 1 week follow-up. He is tolerated the compression wrap well. He has no issues or complaints today. He denies signs of infection. 9/14; patient presents for 1 week follow-up. He has no issues or complaints today. He denies signs of infection. He is in excellent spirits today. 9/21; patient presents for 1 week follow-up. He has no issues or complaints today. Shane Bean, Shane Bean ([id number]) 9/28; patient presents for 1 week  follow-up. He has tolerated the compression wrap well. He has no issues or complaints today. 10/5; patient presents for 1 week follow-up. He has no issues or complaints today. 10/12; patient presents for 1 week follow-up. He has no issues or complaints today. He denies signs of infection. Readmission: 04-08-2021 upon evaluation today patient appears to be doing poorly in regard to his right lower extremity. He is being seen for reevaluation here in the clinic he was last discharged healed in October 2022. Subsequently at that time it was noted in the chart that he was told to be wearing compression socks unfortunately the patient tells me he never remembers being told that. I did discuss this with him today as well as Hosie Poisson discussing it with him today again. Obviously he does know at this point once he is healed he should be wearing compression socks all the time from the time he gets up in the morning to when he goes to sleep at night day in and day out. I think this is the only way that you can to keep things under control. Otherwise his medical history has not changed his arterial study in the past was excellent and did not appear to show any signs of issue at this point which is great news as well. 04-15-2021 upon evaluation today patient's wound is actually looking much drier than what it was last week. Again I do believe the infection is showing signs of improvement although he is having pain for sore I think this has to do more with the dryness of the wound at this time as opposed to the infection that we were noted in the last week. Fortunately I do not see any evidence of active infection locally or systemically at this time which is great news. 04-22-2021 upon evaluation today patient unfortunately continues to have issues with his leg. This is getting significantly worse despite being on Augmentin. I am actually extremely concerned about worsening infection and especially considering the way  the wound looks today and the odor present. 05-04-2021 upon evaluation today patient appears to be doing better with regard to the infection in his leg. He has been tolerating the dressing changes  without complication and overall I am extremely pleased with where we stand. There does not appear to be any evidence of active infection locally or systemically which is great news. 05-13-2021 upon evaluation today patient appears to be doing better in regard to his leg ulcer. He has been tolerating the dressing changes without complication. He does have still significant pain he does have a lot of necrotic tissue which is working its way off and there is no signs of significant infection at this point which is great news. With that being said I do believe that the patient does need to likely continue with the wound care measures at this point with regard to the dressing that we are utilizing. 05-20-2021 upon evaluation today patient's wound is actually showing signs of fairly good granulation and epithelization in a lot of areas he still has some slough noted at this point though a lot of the eschar is softening up I do believe the Xeroform has been helpful in this regard. Fortunately I do not see any evidence of active infection locally or systemically which is great news. No fevers, chills, nausea, vomiting, or diarrhea. I do believe the 3 layer compression wrap has been beneficial. 05-27-2021 upon evaluation today patient's wound is actually showing signs of significant improvement with a lot of new epithelial growth at this point. Fortunately I do not see any signs of active infection locally or systemically which is great news and overall I think we are headed in the right direction. 06-03-2021 upon evaluation today patient appears to be doing well with regard to his wounds although he still has a lot of slough and biofilm buildup noted. We are going to need to try to do some sharp debridement today to  clear this away and he is in agreement with allowing me to try what we can. I am not certain how much working to be able to remove or not. 06-10-2021 upon evaluation today patient appears to be doing well with regard to his wound from the overall appearance and size standpoint this is good. From a drainage standpoint and the odor as well this seems to still be infected in my opinion. I do not think the Augmentin was sufficient. With that being said I discussed this with the patient I do believe that he may benefit from topical [place] antibiotics in the meantime we will get a use gentamicin to bridge the gap until we get those. 06-17-2021 upon evaluation today patient appears to be doing better in regard to his leg. I have not been able to get him the [facility] as it was too expensive for the topical antibiotics. For that reason would been using gentamicin which I do feel like is helping and I do think that is probably can to be our best bet at this point all things considered. He is in agreement with giving this a trial and seeing how things go. Fortunately I see no evidence of active infection systemically locally some of the odor seems indicate there may be some bacteria present but I am not sure what is best to do past what we are already doing. 06-24-2021 upon evaluation today patient appears to be doing well with regard to his healing in fact he is got more epithelial growth than what we have seen previous. Fortunately I think that each week I see him this is looking better and better. Fortunately I see no signs of active infection at this time locally or systemically which is great news.  Objective Constitutional Well-nourished and well-hydrated in no acute distress. Vitals Time Taken: 1:55 PM, Height: 70 in, Weight: 277 lbs, BMI: 39.7, Temperature: 98.2 F, Pulse: 96 bpm, Respiratory Rate: 18 breaths/min, Blood Pressure: 147/78 mmHg. Shane Bean, Shane Bean ([id number]) Respiratory normal  breathing without difficulty. Psychiatric this patient is able to make decisions and demonstrates good insight into disease process. Alert and Oriented x 3. pleasant and cooperative. General Notes: Upon inspection patient's wound bed showed evidence of good granulation and epithelization at this point. Fortunately I do believe that we are making progress area still draining a lot but much less than it was and overall I still think that each time I see him there is more new tissue growth. Integumentary (Hair, Skin) Wound #3 status is Open. Original cause of wound was Gradually Appeared. The date acquired was: 02/03/2021. The wound has been in treatment 11 weeks. The wound is located on the Right,Lateral,Circumferential Lower Leg. The wound measures 14cm length x 12.8cm width x 0.2cm depth; 140.743cm^2 area and 28.149cm^3 volume. There is Fat Layer (Subcutaneous Tissue) exposed. There is a medium amount of serosanguineous drainage noted. Foul odor after cleansing was noted. There is small (1-33%) red, pink, hyper - granulation within the wound bed. There is a large (67-100%) amount of necrotic tissue within the wound bed including Eschar and Adherent Slough. Assessment Active Problems ICD-10 Type 2 diabetes mellitus with other skin ulcer Venous insufficiency (chronic) (peripheral) Non-pressure chronic ulcer of other part of left lower leg with fat layer exposed Non-pressure chronic ulcer of other part of right lower leg with fat layer exposed Essential (primary) hypertension Procedures Wound #3 Pre-procedure diagnosis of Wound #3 is a Diabetic Wound/Ulcer of the Lower Extremity located on the Right,Lateral,Circumferential Lower Leg . There was a Three Layer Compression Therapy Procedure by Massie Kluver. Post procedure Diagnosis Wound #3: Same as Pre-Procedure Notes: RT lower leg ABI not obtained, TBI 0.9. Plan 1. Would recommend currently that we go ahead and initiate a continuation of  treatment with the wound care measures as before were using Xeroform gauze along with gentamicin underneath. 2. I am also can recommend we have the patient continue to monitor for any signs of worsening or infection if anything changes he should let me know otherwise my hope is that we will continue to be able to get the swelling under control and get the wound healed shortly. We are using a 3 layer compression wrap. We will see patient back for reevaluation in 1 week here in the clinic. If anything worsens or changes patient will contact our office for additional recommendations. Electronic Signature(s) Signed: 06/24/2021 2:28:05 PM By: Worthy Keeler PA-C Entered By: Worthy Keeler on 06/24/2021 14:28:04 Shane Bean ([id number]) -------------------------------------------------------------------------------- SuperBill Details Patient Name: Shane Bean Date of Service: 06/24/2021 Medical Record Number: [id number] Patient Account Number: [id number] Date of Birth/Sex: [birth year] (65 y.o. M) Treating RN: Cornell Barman Primary Care Provider: Reeves Dam Other Clinician: Referring Provider: Reeves Dam Treating Provider/Extender: Jeri Cos Weeks in Treatment: 11 Diagnosis Coding ICD-10 Codes Code Description E11.622 Type 2 diabetes mellitus with other skin ulcer I87.2 Venous insufficiency (chronic) (peripheral) 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 layer exposed L97.812 Non-pressure chronic ulcer of other part of right lower leg with fat layer exposed [place] (primary) hypertension Facility Procedures CPT4 Code: 00349179 Description: (Facility Use Only) [id number] - APPLY [place] LWR RT LEG Modifier: Quantity: 1 Physician Procedures CPT4 Code: 9480165 Description: 53748 - WC  PHYS LEVEL 4 - EST PT Modifier: Quantity: 1 CPT4 Code: Description: ICD-10 Diagnosis Description E11.622 Type 2 diabetes mellitus with other  skin ulcer I87.2 Venous insufficiency (chronic) (peripheral) 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 lay L97.812 Non-pressure  chronic ulcer of other part of right lower leg with fat la Modifier: er exposed yer exposed Quantity: Electronic Signature(s) Signed: 06/24/2021 4:35:11 PM By: Massie Kluver Signed: 06/24/2021 5:14:12 PM By: Worthy Keeler PA-C Previous Signature: 06/24/2021 2:28:28 PM Version By: Worthy Keeler PA-C Entered By: Massie Kluver on 06/24/2021 14:46:19

## 2021-06-29 ENCOUNTER — Encounter: Payer: Self-pay | Admitting: *Deleted

## 2021-06-29 ENCOUNTER — Encounter: Payer: Medicare (Managed Care) | Admitting: *Deleted

## 2021-06-29 VITALS — BP 136/70 | Ht 70.0 in | Wt 277.2 lb

## 2021-06-29 DIAGNOSIS — E1165 Type 2 diabetes mellitus with hyperglycemia: Secondary | ICD-10-CM

## 2021-06-29 DIAGNOSIS — E11622 Type 2 diabetes mellitus with other skin ulcer: Secondary | ICD-10-CM | POA: Diagnosis not present

## 2021-06-29 NOTE — Progress Notes (Signed)
Diabetes Self-Management Education  Visit Type: First/Initial  Appt. Start Time: 1420 Appt. End Time: 1540  06/29/2021  Mr. Shane Bean, identified by name and date of birth, is a 66 y.o. male with a diagnosis of Diabetes: Type 2.   ASSESSMENT  Blood pressure 136/70, height 5\' 10"  (1.778 m), weight 277 lb 3.2 oz (125.7 kg). Body mass index is 39.77 kg/m.   Diabetes Self-Management Education - 06/29/21 1614       Visit Information   Visit Type First/Initial      Initial Visit   Diabetes Type Type 2    Date Diagnosed 3-4 years    Are you currently following a meal plan? No    Are you taking your medications as prescribed? Yes      Health Coping   How would you rate your overall health? Fair      Psychosocial Assessment   Patient Belief/Attitude about Diabetes Other (comment)   "terrible at times"   What is the hardest part about your diabetes right now, causing you the most concern, or is the most worrisome to you about your diabetes?   Making healty food and beverage choices    Self-care barriers None    Self-management support Doctor's office    Patient Concerns Nutrition/Meal planning;Medication;Glycemic Control;Weight Control;Healthy Lifestyle    Special Needs None    Preferred Learning Style Hands on    Learning Readiness Ready    How often do you need to have someone help you when you read instructions, pamphlets, or other written materials from your doctor or pharmacy? 1 - Never    What is the last grade level you completed in school? 12 + 1      Pre-Education Assessment   Patient understands the diabetes disease and treatment process. Needs Review    Patient understands incorporating nutritional management into lifestyle. Needs Instruction    Patient undertands incorporating physical activity into lifestyle. Needs Instruction    Patient understands using medications safely. Needs Review    Patient understands monitoring blood glucose, interpreting and using  results Needs Review    Patient understands prevention, detection, and treatment of acute complications. Needs Instruction    Patient understands prevention, detection, and treatment of chronic complications. Needs Review    Patient understands how to develop strategies to address psychosocial issues. Needs Instruction    Patient understands how to develop strategies to promote health/change behavior. Needs Instruction      Complications   Last HgB A1C per patient/outside source 10.4 %   04/28/2021   How often do you check your blood sugar? 0 times/day (not testing)   He has a meter and reports only checking "sometimes". Last week he reports BG of 200's mg/dL at lunch. BG in the office was 269 mg/dL at 1:61 pm - 2 1/2 hrs pp.   He reports eating a chicken sandwich and a frosty from [facility].    Have you had a dilated eye exam in the past 12 months? Yes    Have you had a dental exam in the past 12 months? No    Are you checking your feet? Yes    How many days per week are you checking your feet? 7      Dietary Intake   Breakfast reports eating 1-2 meals/day    Snack (morning) 0-1 snacks/day - peanut butter crackers, cookies, sometimes fruit - apple, banana    Lunch 2:30-3:00 pm [employer] chicken, dirty rice, green beans; [employer] chicken pot pie, [employer]  chicken, green beans, rice    Dinner likes chicken and fish, potatoes, peas, beans, rice, broccoli, green beans, greens    Beverage(s) water, juice diet soda      Activity / Exercise   Activity / Exercise Type ADL's      Patient Education   Previous Diabetes Education Yes (please comment)   He can't remember if he went to classes or had 1:1 counseling in [facility].   Disease Pathophysiology Factors that contribute to the development of diabetes;Explored patient's options for treatment of their diabetes    Healthy Eating Role of diet in the treatment of diabetes and the relationship between the three main macronutrients and blood glucose  level;Food label reading, portion sizes and measuring food.;Reviewed blood glucose goals for pre and post meals and how to evaluate the patients' food intake on their blood glucose level.;Meal timing in regards to the patients' current diabetes medication.;Effects of alcohol on blood glucose and safety factors with consumption of alcohol.;Information on hints to eating out and maintain blood glucose control.    Being Active Role of exercise on diabetes management, blood pressure control and cardiac health.    Medications Reviewed patients medication for diabetes, action, purpose, timing of dose and side effects.    Monitoring Purpose and frequency of SMBG.;Taught/discussed recording of test results and interpretation of SMBG.;Identified appropriate SMBG and/or A1C goals.    Chronic complications Relationship between chronic complications and blood glucose control;Assessed and discussed foot care and prevention of foot problems   Correlation between wound healing and high blood sugars.   Diabetes Stress and Support Identified and addressed patients feelings and concerns about diabetes      Individualized Goals (developed by patient)   Reducing Risk Other (comment)   improve blood sugars, decrease medications, lose weight, lead a healthier lifestyle, become more fit     Outcomes   Expected Outcomes Demonstrated interest in learning. Expect positive outcomes    Future DMSE 4-6 wks         Individualized Plan for Diabetes Self-Management Training:   Learning Objective:  Patient will have a greater understanding of diabetes self-management. Patient education plan is to attend individual and/or group sessions per assessed needs and concerns.   Plan:   Patient Instructions  Check blood sugars 1 day before breakfast or 2 hrs after one meal every day Bring blood sugar records to the next appointment  Exercise:  Walk as tolerated (don't sit for long periods)  Eat 3 meals day,   1  snack a  day Space meals 4-6 hours apart Don't skip meals - eat at least 1 protein and 1 carbohydrate serving Avoid sugar sweetened drinks (juices) Limit foods high in fat (biscuit, fried chicken, pot pie) Bring food labels to next appointment  Return for appointment on:   Tuesday July 27, 2021 at 1:30 pm with Velna Hatchet (nurse)  Expected Outcomes:  Demonstrated interest in learning. Expect positive outcomes  Education material provided:  General Meal Planning Guidelines Simple Meal Plan  If problems or questions, patient to contact team via:   Sharion Settler, RN, CCM, CDCES [phone]  Future DSME appointment: 4-6 wks July 27, 2021 with this educator

## 2021-07-01 ENCOUNTER — Encounter: Payer: Medicare (Managed Care) | Admitting: Physician Assistant

## 2021-07-01 DIAGNOSIS — E11622 Type 2 diabetes mellitus with other skin ulcer: Secondary | ICD-10-CM | POA: Diagnosis not present

## 2021-07-01 NOTE — Progress Notes (Addendum)
Shane Bean, Shane Bean ([id number]) Visit Report for 07/01/2021 Chief Complaint Document Details Patient Name: [name], [name] Date of Service: 07/01/2021 1:30 PM Medical Record Number: [id number] Patient Account Number: [id number] Date of Birth/Sex: 10-30-1955 (66 y.o. M) Treating RN: Carlene Coria Primary Care Provider: Reeves Dam Other Clinician: Massie Kluver Referring Provider: Reeves Dam Treating Provider/Extender: Skipper Cliche in Treatment: 12 Information Obtained from: Patient Chief Complaint Left LE Ulcer Electronic Signature(s) Signed: 07/01/2021 1:23:56 PM By: Worthy Keeler PA-C Entered By: Worthy Keeler on 07/01/2021 13:23:56 Shane Bean ([id number]) -------------------------------------------------------------------------------- HPI Details Patient Name: Shane Bean Date of Service: 07/01/2021 1:30 PM Medical Record Number: [id number] Patient Account Number: [id number] Date of Birth/Sex: 19-Sep-1955 (65 y.o. M) Treating RN: Carlene Coria Primary Care Provider: Reeves Dam Other Clinician: Massie Kluver Referring Provider: Reeves Dam Treating Provider/Extender: Skipper Cliche in Treatment: 12 History of Present Illness HPI Description: 05/18/2020 upon evaluation today patient appears for initial evaluation here in clinic concerning issues that he has been having with the wound on his left lateral leg. Fortunately there does not appear to be any signs of obvious an active infection at this time which is great news. With that being said the patient unfortunately is continued to have issues with pain although he tells me it is gotten a lot better. He was on Keflex as well as Bactrim DS which seems to have done a good job there. His most recent hemoglobin A1c was 9.8 that was on 04/14/2020. Subsequently I do feel like that the patient is making progress here. He does have a history of chronic venous insufficiency as well as hypertension. I do  not see any need for antibiotics at this point. 5/25; follow-up of the wound on the left posterior calf. This is completely necrotic on the surface. It does not look infected but it is painful. He is a diabetic but I think there is some suggestion here of chronic venous disease as well. We used Iodoflex under compression last week 6/1; again a completely necrotic surface on this with the wound. We have been using Iodoflex under compression he complains of gnawing pain. He has had wounds previously a lot of this looks like chronic venous disease. 6/8; about two thirds of the surface of this wound is still covered in a black necrotic eschar. I changed him from Iodoflex to [place] last week because of complaints of pain. He actually was seen by her neurologist this weeko Peripheral neuropathy as a cause of the pain and they changed him from Neurontin to Lyrica but he still has not had any relief. He says that most of the pain however is in his heel not in his wound per se. He is a diabetic 6/15. This is a patient with what looks to be a venous wound on the left lateral lower leg. He is also a diabetic. Currently being worked up for diabetic neuropathy. He complains of pain out of proportion to the size of the wound although to be fair the entire area here was eschared. We have been working to get a viable surface. We are using Sorbact 6/22; fairly painful wound on the left posterior calf. I am assuming this is been venous he is also a diabetic. His ABI in our clinic was noncompressible however he has easily palpable pulses on his feet. He complains of a lot of pain in the wound also of the left heel and the upper left calf. Some of this may be neuropathy. It is  led me to discontinue Iodoflex reduce his compression but he still complains of pain in fact he says he could not take a debridement today. When I first saw this it was completely necrotic surface we have got it down to something that looks  a reasonably healthy I have been wondering about biopsying this area however he is on Coumadin and with the pain I have put this off. The major question would be an inflammatory ulcer such as pyoderma. The patient is not aware of how this started. He does however have a wound history on both legs. 6/29; patient presents for 1 week follow-up. He has been using Hydrofera Blue under Kerlix/Coban. He has tolerated this well. He currently denies signs of infection. He also declines debridement today. He he states it is painful and does not want to have this done. 7/6; Patient presents for 1 week follow-up. He has been using Hydrofera Blue under Kerlix/Coban. He denies signs of infection. He declines debridement today. 7/15; patient presents for 1 week follow-up. He has been using Hydrofera Blue under Kerlix/Coban. He denies signs of infection today. He is agreeable to having debridement done today. 7/20; patient presents for 1 week follow-up. He is in good spirits today. He has been using Hydrofera Blue under Kerlix/Coban. He denies signs of infection today. 7/27; patient presents for 1 week follow-up. He is in good spirits today. He has no issues or complaints today. He denies signs of infection. 08/12/2020 upon evaluation today patient appears to be doing better in regard to his wound. He has been tolerating the dressing changes without complication. With that being said he did have arterial studies and does show that he has a TBI on the right of 0.90 and a TBI on the left of 0.76 which is excellent. There is no evidence of arterial compromise at this point. 8/17; patient presents for 1 week follow-up. He has no issues or complaints today. He denies signs of infection. 8/24; patient presents for 1 week follow-up. He has no issues or complaints today. He denies signs of infection. He continues to tolerate the compression wrap well. 8/31; patient presents for 1 week follow-up. He has no issues or  complaints today. He denies signs of infection. He is in good spirits today. 9/7; patient presents for 1 week follow-up. He is tolerated the compression wrap well. He has no issues or complaints today. He denies signs of infection. 9/14; patient presents for 1 week follow-up. He has no issues or complaints today. He denies signs of infection. He is in excellent spirits today. 9/21; patient presents for 1 week follow-up. He has no issues or complaints today. 9/28; patient presents for 1 week follow-up. He has tolerated the compression wrap well. He has no issues or complaints today. 10/5; patient presents for 1 week follow-up. He has no issues or complaints today. 10/12; patient presents for 1 week follow-up. He has no issues or complaints today. He denies signs of infection. Shane Bean, Shane Bean ([id number]) Readmission: 04-08-2021 upon evaluation today patient appears to be doing poorly in regard to his right lower extremity. He is being seen for reevaluation here in the clinic he was last discharged healed in October 2022. Subsequently at that time it was noted in the chart that he was told to be wearing compression socks unfortunately the patient tells me he never remembers being told that. I did discuss this with him today as well as Hosie Poisson discussing it with him today again. Obviously he does know at  this point once he is healed he should be wearing compression socks all the time from the time he gets up in the morning to when he goes to sleep at night day in and day out. I think this is the only way that you can to keep things under control. Otherwise his medical history has not changed his arterial study in the past was excellent and did not appear to show any signs of issue at this point which is great news as well. 04-15-2021 upon evaluation today patient's wound is actually looking much drier than what it was last week. Again I do believe the infection is showing signs of improvement although  he is having pain for sore I think this has to do more with the dryness of the wound at this time as opposed to the infection that we were noted in the last week. Fortunately I do not see any evidence of active infection locally or systemically at this time which is great news. 04-22-2021 upon evaluation today patient unfortunately continues to have issues with his leg. This is getting significantly worse despite being on Augmentin. I am actually extremely concerned about worsening infection and especially considering the way the wound looks today and the odor present. 05-04-2021 upon evaluation today patient appears to be doing better with regard to the infection in his leg. He has been tolerating the dressing changes without complication and overall I am extremely pleased with where we stand. There does not appear to be any evidence of active infection locally or systemically which is great news. 05-13-2021 upon evaluation today patient appears to be doing better in regard to his leg ulcer. He has been tolerating the dressing changes without complication. He does have still significant pain he does have a lot of necrotic tissue which is working its way off and there is no signs of significant infection at this point which is great news. With that being said I do believe that the patient does need to likely continue with the wound care measures at this point with regard to the dressing that we are utilizing. 05-20-2021 upon evaluation today patient's wound is actually showing signs of fairly good granulation and epithelization in a lot of areas he still has some slough noted at this point though a lot of the eschar is softening up I do believe the Xeroform has been helpful in this regard. Fortunately I do not see any evidence of active infection locally or systemically which is great news. No fevers, chills, nausea, vomiting, or diarrhea. I do believe the 3 layer compression wrap has been  beneficial. 05-27-2021 upon evaluation today patient's wound is actually showing signs of significant improvement with a lot of new epithelial growth at this point. Fortunately I do not see any signs of active infection locally or systemically which is great news and overall I think we are headed in the right direction. 06-03-2021 upon evaluation today patient appears to be doing well with regard to his wounds although he still has a lot of slough and biofilm buildup noted. We are going to need to try to do some sharp debridement today to clear this away and he is in agreement with allowing me to try what we can. I am not certain how much working to be able to remove or not. 06-10-2021 upon evaluation today patient appears to be doing well with regard to his wound from the overall appearance and size standpoint this is good. From a drainage standpoint and the  odor as well this seems to still be infected in my opinion. I do not think the Augmentin was sufficient. With that being said I discussed this with the patient I do believe that he may benefit from topical [place] antibiotics in the meantime we will get a use gentamicin to bridge the gap until we get those. 06-17-2021 upon evaluation today patient appears to be doing better in regard to his leg. I have not been able to get him the [facility] as it was too expensive for the topical antibiotics. For that reason would been using gentamicin which I do feel like is helping and I do think that is probably can to be our best bet at this point all things considered. He is in agreement with giving this a trial and seeing how things go. Fortunately I see no evidence of active infection systemically locally some of the odor seems indicate there may be some bacteria present but I am not sure what is best to do past what we are already doing. 06-24-2021 upon evaluation today patient appears to be doing well with regard to his healing in fact he is got more  epithelial growth than what we have seen previous. Fortunately I think that each week I see him this is looking better and better. Fortunately I see no signs of active infection at this time locally or systemically which is great news. 07-01-2021 upon evaluation today patient appears to be doing excellent in regard to his leg ulcers. He has been tolerating the dressing changes without complication. Fortunately I see no evidence of active infection locally or systemically at this time which is great news. Overall I am extremely happy with where we stand today. Electronic Signature(s) Signed: 07/02/2021 8:05:54 AM By: Worthy Keeler PA-C Entered By: Worthy Keeler on 07/02/2021 08:05:54 Shane Bean ([id number]) -------------------------------------------------------------------------------- Physical Exam Details Patient Name: Shane Bean Date of Service: 07/01/2021 1:30 PM Medical Record Number: [id number] Patient Account Number: [id number] Date of Birth/Sex: 11/28/1955 (65 y.o. M) Treating RN: Carlene Coria Primary Care Provider: Reeves Dam Other Clinician: Massie Kluver Referring Provider: Reeves Dam Treating Provider/Extender: Jeri Cos Weeks in Treatment: 33 Constitutional Well-nourished and well-hydrated in no acute distress. Respiratory normal breathing without difficulty. Psychiatric this patient is able to make decisions and demonstrates good insight into disease process. Alert and Oriented x 3. pleasant and cooperative. Notes Patient's wound bed actually showed signs of good granulation and epithelization at this point. Fortunately there does not appear to be any evidence of active infection currently which is great news and overall I am extremely pleased with where we stand. Electronic Signature(s) Signed: 07/02/2021 8:06:11 AM By: Worthy Keeler PA-C Entered By: Worthy Keeler on 07/02/2021 08:06:11 Shane Bean  ([id number]) -------------------------------------------------------------------------------- Physician Orders Details Patient Name: Shane Bean Date of Service: 07/01/2021 1:30 PM Medical Record Number: [id number] Patient Account Number: [id number] Date of Birth/Sex: 1955/11/04 (65 y.o. M) Treating RN: Carlene Coria Primary Care Provider: Reeves Dam Other Clinician: Massie Kluver Referring Provider: Reeves Dam Treating Provider/Extender: Skipper Cliche in Treatment: 12 Verbal / Phone Orders: No Diagnosis Coding ICD-10 Coding Code Description E11.622 Type 2 diabetes mellitus with other skin ulcer I87.2 Venous insufficiency (chronic) (peripheral) 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 layer exposed L97.812 Non-pressure chronic ulcer of other part of right lower leg with fat layer exposed I10 Essential (primary) hypertension Follow-up Appointments o Return Appointment in 1 week. - 3 layer wrap every  week o Nurse Visit as needed Bathing/ Shower/ Hygiene o May shower; gently cleanse wound with antibacterial soap, rinse and pat dry prior to dressing wounds o May shower with wound dressing protected with water repellent cover or cast protector. o No tub bath. Anesthetic (Use 'Patient Medications' Section for Anesthetic Order Entry) o Lidocaine applied to wound bed Edema Control - Lymphedema / Segmental Compressive Device / Other o Elevate, Exercise Daily and Avoid Standing for Long Periods of Time. o Elevate legs to the level of the heart and pump ankles as often as possible o Elevate leg(s) parallel to the floor when sitting. Additional Orders / Instructions o Follow Nutritious Diet and Increase Protein Intake - monitor blood sugar Wound Treatment Wound #3 - Lower Leg Wound Laterality: Right, Lateral, Circumferential Cleanser: Soap and Water 1 x Per Week/15 Days Discharge Instructions: Gently cleanse wound with  antibacterial soap, rinse and pat dry prior to dressing wounds Cleanser: Wound Cleanser 1 x Per Week/15 Days Discharge Instructions: Wash your hands with soap and water. Remove old dressing, discard into plastic bag and place into trash. Cleanse the wound with Wound Cleanser prior to applying a clean dressing using gauze sponges, not tissues or cotton balls. Do not scrub or use excessive force. Pat dry using gauze sponges, not tissue or cotton balls. Topical: Gentamicin 1 x Per Week/15 Days Discharge Instructions: Apply thin film topically until [facility] antibiotic recieved Primary Dressing: Xeroform 5x9-HBD (in/in) 1 x Per Week/15 Days Discharge Instructions: Apply Xeroform 5x9-HBD (in/in) as directed Secondary Dressing: ABD Pad 5x9 (in/in) 1 x Per Week/15 Days Discharge Instructions: 2 pads to Cover with ABD pad Compression Wrap: 3-LAYER WRAP - Profore Lite LF 3 Multilayer Compression Bandaging System 1 x Per Week/15 Days Discharge Instructions: Apply 3 multi-layer wrap as prescribed. Electronic Signature(s) Shane Bean, Shane Bean ([id number]) Signed: 07/02/2021 4:25:13 PM By: Massie Kluver Signed: 07/02/2021 4:59:17 PM By: Worthy Keeler PA-C Entered By: Massie Kluver on 07/01/2021 13:57:54 Shane Bean ([id number]) -------------------------------------------------------------------------------- Problem List Details Patient Name: Shane Bean Date of Service: 07/01/2021 1:30 PM Medical Record Number: [id number] Patient Account Number: [id number] Date of Birth/Sex: [birth year] (65 y.o. M) Treating RN: Carlene Coria Primary Care Provider: Reeves Dam Other Clinician: Massie Kluver Referring Provider: Reeves Dam Treating Provider/Extender: Skipper Cliche in Treatment: 12 Active Problems ICD-10 Encounter Code Description Active Date MDM Diagnosis E11.622 Type 2 diabetes mellitus with other skin ulcer 04/08/2021 No Yes I87.2 Venous insufficiency (chronic) (peripheral)  04/08/2021 No Yes 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 layer 04/08/2021 No Yes exposed L97.812 Non-pressure chronic ulcer of other part of right lower leg with fat layer 04/08/2021 No Yes exposed [place] (primary) hypertension 04/08/2021 No Yes Inactive Problems Resolved Problems Electronic Signature(s) Signed: 07/01/2021 1:23:48 PM By: Worthy Keeler PA-C Entered By: Worthy Keeler on 07/01/2021 13:23:48 Shane Bean ([id number]) -------------------------------------------------------------------------------- Progress Note Details Patient Name: Shane Bean Date of Service: 07/01/2021 1:30 PM Medical Record Number: [id number] Patient Account Number: [id number] Date of Birth/Sex: 1955-02-28 (65 y.o. M) Treating RN: Carlene Coria Primary Care Provider: Reeves Dam Other Clinician: Massie Kluver Referring Provider: Reeves Dam Treating Provider/Extender: Skipper Cliche in Treatment: 12 Subjective Chief Complaint Information obtained from Patient Left LE Ulcer History of Present Illness (HPI) 05/18/2020 upon evaluation today patient appears for initial evaluation here in clinic concerning issues that he has been having with the wound on his left lateral leg. Fortunately there does not appear to be any  signs of obvious an active infection at this time which is great news. With that being said the patient unfortunately is continued to have issues with pain although he tells me it is gotten a lot better. He was on Keflex as well as Bactrim DS which seems to have done a good job there. His most recent hemoglobin A1c was 9.8 that was on 04/14/2020. Subsequently I do feel like that the patient is making progress here. He does have a history of chronic venous insufficiency as well as hypertension. I do not see any need for antibiotics at this point. 5/25; follow-up of the wound on the left posterior calf. This is completely necrotic on the  surface. It does not look infected but it is painful. He is a diabetic but I think there is some suggestion here of chronic venous disease as well. We used Iodoflex under compression last week 6/1; again a completely necrotic surface on this with the wound. We have been using Iodoflex under compression he complains of gnawing pain. He has had wounds previously a lot of this looks like chronic venous disease. 6/8; about two thirds of the surface of this wound is still covered in a black necrotic eschar. I changed him from Iodoflex to [place] last week because of complaints of pain. He actually was seen by her neurologist this weeko Peripheral neuropathy as a cause of the pain and they changed him from Neurontin to Lyrica but he still has not had any relief. He says that most of the pain however is in his heel not in his wound per se. He is a diabetic 6/15. This is a patient with what looks to be a venous wound on the left lateral lower leg. He is also a diabetic. Currently being worked up for diabetic neuropathy. He complains of pain out of proportion to the size of the wound although to be fair the entire area here was eschared. We have been working to get a viable surface. We are using Sorbact 6/22; fairly painful wound on the left posterior calf. I am assuming this is been venous he is also a diabetic. His ABI in our clinic was noncompressible however he has easily palpable pulses on his feet. He complains of a lot of pain in the wound also of the left heel and the upper left calf. Some of this may be neuropathy. It is led me to discontinue Iodoflex reduce his compression but he still complains of pain in fact he says he could not take a debridement today. When I first saw this it was completely necrotic surface we have got it down to something that looks a reasonably healthy I have been wondering about biopsying this area however he is on Coumadin and with the pain I have put this off. The major  question would be an inflammatory ulcer such as pyoderma. The patient is not aware of how this started. He does however have a wound history on both legs. 6/29; patient presents for 1 week follow-up. He has been using Hydrofera Blue under Kerlix/Coban. He has tolerated this well. He currently denies signs of infection. He also declines debridement today. He he states it is painful and does not want to have this done. 7/6; Patient presents for 1 week follow-up. He has been using Hydrofera Blue under Kerlix/Coban. He denies signs of infection. He declines debridement today. 7/15; patient presents for 1 week follow-up. He has been using Hydrofera Blue under Kerlix/Coban. He denies signs of infection today.  He is agreeable to having debridement done today. 7/20; patient presents for 1 week follow-up. He is in good spirits today. He has been using Hydrofera Blue under Kerlix/Coban. He denies signs of infection today. 7/27; patient presents for 1 week follow-up. He is in good spirits today. He has no issues or complaints today. He denies signs of infection. 08/12/2020 upon evaluation today patient appears to be doing better in regard to his wound. He has been tolerating the dressing changes without complication. With that being said he did have arterial studies and does show that he has a TBI on the right of 0.90 and a TBI on the left of 0.76 which is excellent. There is no evidence of arterial compromise at this point. 8/17; patient presents for 1 week follow-up. He has no issues or complaints today. He denies signs of infection. 8/24; patient presents for 1 week follow-up. He has no issues or complaints today. He denies signs of infection. He continues to tolerate the compression wrap well. 8/31; patient presents for 1 week follow-up. He has no issues or complaints today. He denies signs of infection. He is in good spirits today. 9/7; patient presents for 1 week follow-up. He is tolerated the  compression wrap well. He has no issues or complaints today. He denies signs of infection. 9/14; patient presents for 1 week follow-up. He has no issues or complaints today. He denies signs of infection. He is in excellent spirits today. 9/21; patient presents for 1 week follow-up. He has no issues or complaints today. Shane Bean, Shane Bean ([id number]) 9/28; patient presents for 1 week follow-up. He has tolerated the compression wrap well. He has no issues or complaints today. 10/5; patient presents for 1 week follow-up. He has no issues or complaints today. 10/12; patient presents for 1 week follow-up. He has no issues or complaints today. He denies signs of infection. Readmission: 04-08-2021 upon evaluation today patient appears to be doing poorly in regard to his right lower extremity. He is being seen for reevaluation here in the clinic he was last discharged healed in October 2022. Subsequently at that time it was noted in the chart that he was told to be wearing compression socks unfortunately the patient tells me he never remembers being told that. I did discuss this with him today as well as Hosie Poisson discussing it with him today again. Obviously he does know at this point once he is healed he should be wearing compression socks all the time from the time he gets up in the morning to when he goes to sleep at night day in and day out. I think this is the only way that you can to keep things under control. Otherwise his medical history has not changed his arterial study in the past was excellent and did not appear to show any signs of issue at this point which is great news as well. 04-15-2021 upon evaluation today patient's wound is actually looking much drier than what it was last week. Again I do believe the infection is showing signs of improvement although he is having pain for sore I think this has to do more with the dryness of the wound at this time as opposed to the infection that we were  noted in the last week. Fortunately I do not see any evidence of active infection locally or systemically at this time which is great news. 04-22-2021 upon evaluation today patient unfortunately continues to have issues with his leg. This is getting significantly worse despite  being on Augmentin. I am actually extremely concerned about worsening infection and especially considering the way the wound looks today and the odor present. 05-04-2021 upon evaluation today patient appears to be doing better with regard to the infection in his leg. He has been tolerating the dressing changes without complication and overall I am extremely pleased with where we stand. There does not appear to be any evidence of active infection locally or systemically which is great news. 05-13-2021 upon evaluation today patient appears to be doing better in regard to his leg ulcer. He has been tolerating the dressing changes without complication. He does have still significant pain he does have a lot of necrotic tissue which is working its way off and there is no signs of significant infection at this point which is great news. With that being said I do believe that the patient does need to likely continue with the wound care measures at this point with regard to the dressing that we are utilizing. 05-20-2021 upon evaluation today patient's wound is actually showing signs of fairly good granulation and epithelization in a lot of areas he still has some slough noted at this point though a lot of the eschar is softening up I do believe the Xeroform has been helpful in this regard. Fortunately I do not see any evidence of active infection locally or systemically which is great news. No fevers, chills, nausea, vomiting, or diarrhea. I do believe the 3 layer compression wrap has been beneficial. 05-27-2021 upon evaluation today patient's wound is actually showing signs of significant improvement with a lot of new epithelial growth at  this point. Fortunately I do not see any signs of active infection locally or systemically which is great news and overall I think we are headed in the right direction. 06-03-2021 upon evaluation today patient appears to be doing well with regard to his wounds although he still has a lot of slough and biofilm buildup noted. We are going to need to try to do some sharp debridement today to clear this away and he is in agreement with allowing me to try what we can. I am not certain how much working to be able to remove or not. 06-10-2021 upon evaluation today patient appears to be doing well with regard to his wound from the overall appearance and size standpoint this is good. From a drainage standpoint and the odor as well this seems to still be infected in my opinion. I do not think the Augmentin was sufficient. With that being said I discussed this with the patient I do believe that he may benefit from topical [facility] antibiotics in the meantime we will get a use gentamicin to bridge the gap until we get those. 06-17-2021 upon evaluation today patient appears to be doing better in regard to his leg. I have not been able to get him the [facility] as it was too expensive for the topical antibiotics. For that reason would been using gentamicin which I do feel like is helping and I do think that is probably can to be our best bet at this point all things considered. He is in agreement with giving this a trial and seeing how things go. Fortunately I see no evidence of active infection systemically locally some of the odor seems indicate there may be some bacteria present but I am not sure what is best to do past what we are already doing. 06-24-2021 upon evaluation today patient appears to be doing well with regard to  his healing in fact he is got more epithelial growth than what we have seen previous. Fortunately I think that each week I see him this is looking better and better. Fortunately I see no signs of  active infection at this time locally or systemically which is great news. 07-01-2021 upon evaluation today patient appears to be doing excellent in regard to his leg ulcers. He has been tolerating the dressing changes without complication. Fortunately I see no evidence of active infection locally or systemically at this time which is great news. Overall I am extremely happy with where we stand today. Objective Constitutional Well-nourished and well-hydrated in no acute distress. Shane Bean, Shane Bean ([id number]) Vitals Time Taken: 1:21 PM, Height: 70 in, Weight: 277 lbs, BMI: 39.7, Temperature: 98.1 F, Pulse: 69 bpm, Respiratory Rate: 8 breaths/min, Blood Pressure: 134/71 mmHg. Respiratory normal breathing without difficulty. Psychiatric this patient is able to make decisions and demonstrates good insight into disease process. Alert and Oriented x 3. pleasant and cooperative. General Notes: Patient's wound bed actually showed signs of good granulation and epithelization at this point. Fortunately there does not appear to be any evidence of active infection currently which is great news and overall I am extremely pleased with where we stand. Integumentary (Hair, Skin) Wound #3 status is Open. Original cause of wound was Gradually Appeared. The date acquired was: 02/03/2021. The wound has been in treatment 12 weeks. The wound is located on the Right,Lateral,Circumferential Lower Leg. The wound measures 8.5cm length x 12.7cm width x 0.1cm depth; 84.784cm^2 area and 8.478cm^3 volume. There is Fat Layer (Subcutaneous Tissue) exposed. There is a medium amount of serosanguineous drainage noted. Foul odor after cleansing was noted. There is small (1-33%) red, pink, hyper - granulation within the wound bed. There is a large (67-100%) amount of necrotic tissue within the wound bed including Eschar and Adherent Slough. Assessment Active Problems ICD-10 Type 2 diabetes mellitus with other skin  ulcer Venous insufficiency (chronic) (peripheral) Non-pressure chronic ulcer of other part of left lower leg with fat layer exposed Non-pressure chronic ulcer of other part of right lower leg with fat layer exposed Essential (primary) hypertension Procedures Wound #3 Pre-procedure diagnosis of Wound #3 is a Diabetic Wound/Ulcer of the Lower Extremity located on the Right,Lateral,Circumferential Lower Leg . There was a Three Layer Compression Therapy Procedure by Carlene Coria, RN. Post procedure Diagnosis Wound #3: Same as Pre-Procedure Notes: Right leg noncompressible Patient tolerates wrap well. Plan Follow-up Appointments: Return Appointment in 1 week. - 3 layer wrap every week Nurse Visit as needed Bathing/ Shower/ Hygiene: May shower; gently cleanse wound with antibacterial soap, rinse and pat dry prior to dressing wounds May shower with wound dressing protected with water repellent cover or cast protector. No tub bath. Anesthetic (Use 'Patient Medications' Section for Anesthetic Order Entry): Lidocaine applied to wound bed Edema Control - Lymphedema / Segmental Compressive Device / Other: Elevate, Exercise Daily and Avoid Standing for Long Periods of Time. Elevate legs to the level of the heart and pump ankles as often as possible Elevate leg(s) parallel to the floor when sitting. Additional Orders / Instructions: Follow Nutritious Diet and Increase Protein Intake - monitor blood sugar WOUND #3: - Lower Leg Wound Laterality: Right, Lateral, Circumferential Cleanser: Soap and Water 1 x Per Week/15 Days Discharge Instructions: Gently cleanse wound with antibacterial soap, rinse and pat dry prior to dressing wounds Cleanser: Wound Cleanser 1 x Per Week/15 Days Discharge Instructions: Wash your hands with soap and water. Remove old dressing,  discard into plastic bag and place into trash. Shane Bean, Shane Bean ([id number]) Cleanse the wound with Wound Cleanser prior to applying a clean  dressing using gauze sponges, not tissues or cotton balls. Do not scrub or use excessive force. Pat dry using gauze sponges, not tissue or cotton balls. Topical: Gentamicin 1 x Per Week/15 Days Discharge Instructions: Apply thin film topically until [facility] antibiotic recieved Primary Dressing: Xeroform 5x9-HBD (in/in) 1 x Per Week/15 Days Discharge Instructions: Apply Xeroform 5x9-HBD (in/in) as directed Secondary Dressing: ABD Pad 5x9 (in/in) 1 x Per Week/15 Days Discharge Instructions: 2 pads to Cover with ABD pad Compression Wrap: 3-LAYER WRAP - Profore Lite LF 3 Multilayer Compression Bandaging System 1 x Per Week/15 Days Discharge Instructions: Apply 3 multi-layer wrap as prescribed. 1. I would recommend currently that we go ahead and continue with the wound care measures as before and the patient is in agreement with the plan. This includes the use of the Xeroform gauze dressing or doing gentamicin down first this is doing a great job. 2. Also can recommend an ABD pad to cover followed by 3 layer compression wrap. 3. I would also suggest patient should continue to monitor for any signs of worsening or infection if anything changes she should let me know. We will see patient back for reevaluation in 1 week here in the clinic. If anything worsens or changes patient will contact our office for additional recommendations. Electronic Signature(s) Signed: 07/02/2021 8:06:46 AM By: Worthy Keeler PA-C Entered By: Worthy Keeler on 07/02/2021 08:06:46 Shane Bean ([id number]) -------------------------------------------------------------------------------- SuperBill Details Patient Name: Shane Bean Date of Service: 07/01/2021 Medical Record Number: [id number] Patient Account Number: [id number] Date of Birth/Sex: [birth year] (65 y.o. M) Treating RN: Carlene Coria Primary Care Provider: Reeves Dam Other Clinician: Massie Kluver Referring Provider: Reeves Dam Treating  Provider/Extender: Skipper Cliche in Treatment: 12 Diagnosis Coding ICD-10 Codes Code Description E11.622 Type 2 diabetes mellitus with other skin ulcer I87.2 Venous insufficiency (chronic) (peripheral) 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 layer exposed L97.812 Non-pressure chronic ulcer of other part of right lower leg with fat layer exposed I10 Essential (primary) hypertension Facility Procedures CPT4 Code: [id number] Description: (Facility Use Only) [phone] - APPLY MULTLAY COMPRS LWR RT LEG Modifier: Quantity: 1 Physician Procedures CPT4 Code: 2637858 Description: 85027 - WC PHYS LEVEL 4 - EST PT Modifier: Quantity: 1 CPT4 Code: Description: ICD-10 Diagnosis Description E11.622 Type 2 diabetes mellitus with other skin ulcer I87.2 Venous insufficiency (chronic) (peripheral) 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 lay L97.812 Non-pressure  chronic ulcer of other part of right lower leg with fat la Modifier: er exposed yer exposed Quantity: Electronic Signature(s) Signed: 07/02/2021 8:13:48 AM By: Worthy Keeler PA-C Entered By: Worthy Keeler on 07/02/2021 08:13:48

## 2021-07-02 NOTE — Progress Notes (Signed)
BRAXEN, DOBEK ([id number]) Visit Report for 07/01/2021 Arrival Information Details Patient Name: Shane Bean Date of Service: 07/01/2021 1:30 PM Medical Record Number: [id number] Patient Account Number: [id number] Date of Birth/Sex: [birth year] (66 y.o. M) Treating RN: Carlene Coria Primary Care Tamieka Rancourt: Reeves Dam Other Clinician: Massie Kluver Referring Kura Bethards: Reeves Dam Treating Kady Toothaker/Extender: Skipper Cliche in Treatment: 12 Visit Information History Since Last Visit All ordered tests and consults were completed: No Patient Arrived: Ambulatory Added or deleted any medications: No Arrival Time: 13:20 Any new allergies or adverse reactions: No Transfer Assistance: None Had a fall or experienced change in No Patient Requires Transmission-Based No activities of daily living that may affect Precautions: risk of falls: Patient Has Alerts: Yes Hospitalized since last visit: No Patient Alerts: Patient on Blood Pain Present Now: No Thinner ABI R Bryce TBI .90 7/22 ABI L 1.46 TBI .76 7/22 Electronic Signature(s) Signed: 07/02/2021 4:25:13 PM By: Massie Kluver Entered By: Massie Kluver on 07/01/2021 13:21:42 Shane Bean ([id number]) -------------------------------------------------------------------------------- Clinic Level of Care Assessment Details Patient Name: Shane Bean Date of Service: 07/01/2021 1:30 PM Medical Record Number: [id number] Patient Account Number: [id number] Date of Birth/Sex: 05-Oct-1955 (66 y.o. M) Treating RN: Carlene Coria Primary Care Shaquayla Klimas: Reeves Dam Other Clinician: Massie Kluver Referring Sathvika Ojo: Reeves Dam Treating Aveon Colquhoun/Extender: Skipper Cliche in Treatment: 12 Clinic Level of Care Assessment Items TOOL 1 Quantity Score []  - Use when EandM and Procedure is performed on INITIAL visit 0 ASSESSMENTS - Nursing Assessment / Reassessment []  - General Physical Exam (combine w/ comprehensive  assessment (listed just below) when performed on new 0 pt. evals) []  - 0 Comprehensive Assessment (HX, ROS, Risk Assessments, Wounds Hx, etc.) ASSESSMENTS - Wound and Skin Assessment / Reassessment []  - Dermatologic / Skin Assessment (not related to wound area) 0 ASSESSMENTS - Ostomy and/or Continence Assessment and Care []  - Incontinence Assessment and Management 0 []  - 0 Ostomy Care Assessment and Management (repouching, etc.) PROCESS - Coordination of Care []  - Simple Patient / Family Education for ongoing care 0 []  - 0 Complex (extensive) Patient / Family Education for ongoing care []  - 0 Staff obtains Programmer, systems, Records, Test Results / Process Orders []  - 0 Staff telephones HHA, Nursing Homes / Clarify orders / etc []  - 0 Routine Transfer to another Facility (non-emergent condition) []  - 0 Routine Hospital Admission (non-emergent condition) []  - 0 New Admissions / Biomedical engineer / Ordering NPWT, Apligraf, etc. []  - 0 Emergency Hospital Admission (emergent condition) PROCESS - Special Needs []  - Pediatric / Minor Patient Management 0 []  - 0 Isolation Patient Management []  - 0 Hearing / Language / Visual special needs []  - 0 Assessment of Community assistance (transportation, D/C planning, etc.) []  - 0 Additional assistance / Altered mentation []  - 0 Support Surface(s) Assessment (bed, cushion, seat, etc.) INTERVENTIONS - Miscellaneous []  - External ear exam 0 []  - 0 Patient Transfer (multiple staff / Civil Service fast streamer / Similar devices) []  - 0 Simple Staple / Suture removal (25 or less) []  - 0 Complex Staple / Suture removal (26 or more) []  - 0 Hypo/Hyperglycemic Management (do not check if billed separately) []  - 0 Ankle / Brachial Index (ABI) - do not check if billed separately Has the patient been seen at the hospital within the last three years: Yes Total Score: 0 Level Of Care: ____ Shane Bean ([id number]) Electronic Signature(s) Signed:  07/02/2021 4:25:13 PM By: Massie Kluver Entered By: Massie Kluver on 07/01/2021 13:58:01 Shane Bean, Shane V. (  [id number]) -------------------------------------------------------------------------------- Compression Therapy Details Patient Name: Shane Bean Date of Service: 07/01/2021 1:30 PM Medical Record Number: [id number] Patient Account Number: [id number] Date of Birth/Sex: July 05, 1955 (66 y.o. M) Treating RN: Carlene Coria Primary Care Doryce Mcgregory: Reeves Dam Other Clinician: Massie Kluver Referring Noland Pizano: Reeves Dam Treating Tayton Decaire/Extender: Skipper Cliche in Treatment: 12 Compression Therapy Performed for Wound Assessment: Wound #3 Right,Lateral,Circumferential Lower Leg Performed By: Clinician Carlene Coria, RN Compression Type: Three Layer Post Procedure Diagnosis Same as Pre-procedure Notes Right leg noncompressible Patient tolerates wrap well Electronic Signature(s) Signed: 07/02/2021 4:25:13 PM By: Massie Kluver Entered By: Massie Kluver on 07/01/2021 13:57:31 Shane Bean ([id number]) -------------------------------------------------------------------------------- Encounter Discharge Information Details Patient Name: Shane Bean Date of Service: 07/01/2021 1:30 PM Medical Record Number: [id number] Patient Account Number: [id number] Date of Birth/Sex: 1955/03/28 (66 y.o. M) Treating RN: Carlene Coria Primary Care Deavion Strider: Reeves Dam Other Clinician: Massie Kluver Referring Jamacia Jester: Reeves Dam Treating Tayvon Culley/Extender: Skipper Cliche in Treatment: 12 Encounter Discharge Information Items Discharge Condition: Stable Ambulatory Status: Ambulatory Discharge Destination: Home Transportation: Private Auto Accompanied By: self Schedule Follow-up Appointment: Yes Clinical Summary of Care: Electronic Signature(s) Signed: 07/02/2021 4:25:13 PM By: Massie Kluver Entered By: Massie Kluver on 07/01/2021 14:20:49 Shane Bean ([id number]) -------------------------------------------------------------------------------- Lower Extremity Assessment Details Patient Name: Shane Bean Date of Service: 07/01/2021 1:30 PM Medical Record Number: [id number] Patient Account Number: [id number] Date of Birth/Sex: 03-16-1955 (66 y.o. M) Treating RN: Carlene Coria Primary Care Lenoard Helbert: Reeves Dam Other Clinician: Massie Kluver Referring Kariya Lavergne: Reeves Dam Treating Ramanda Paules/Extender: Jeri Cos Weeks in Treatment: 12 Electronic Signature(s) Signed: 07/02/2021 10:10:35 AM By: Carlene Coria RN Signed: 07/02/2021 4:25:13 PM By: Massie Kluver Entered By: Massie Kluver on 07/01/2021 13:44:28 Shane Bean ([id number]) -------------------------------------------------------------------------------- Multi Wound Chart Details Patient Name: Shane Bean Date of Service: 07/01/2021 1:30 PM Medical Record Number: [id number] Patient Account Number: [id number] Date of Birth/Sex: 10-12-1955 (66 y.o. M) Treating RN: Carlene Coria Primary Care Kamarius Buckbee: Reeves Dam Other Clinician: Massie Kluver Referring Caylie Sandquist: Reeves Dam Treating Luismiguel Lamere/Extender: Skipper Cliche in Treatment: 12 Vital Signs Height(in): 70 Pulse(bpm): 63 Weight(lbs): 256 Blood Pressure(mmHg): 134/71 Body Mass Index(BMI): 39.7 Temperature(F): 98.1 Respiratory Rate(breaths/min): 8 Photos: [N/A:N/A] Wound Location: Right, Lateral, Circumferential N/A N/A Lower Leg Wounding Event: Gradually Appeared N/A N/A Primary Etiology: Diabetic Wound/Ulcer of the Lower N/A N/A Extremity Comorbid History: Hypertension, Type II Diabetes, N/A N/A Neuropathy Date Acquired: 02/03/2021 N/A N/A Weeks of Treatment: 12 N/A N/A Wound Status: Open N/A N/A Wound Recurrence: No N/A N/A Measurements L x W x D (cm) 8.5x12.7x0.1 N/A N/A Area (cm) : 84.784 N/A N/A Volume (cm) : 8.478 N/A N/A % Reduction in Area: -18.60% N/A N/A % Reduction in  Volume: -18.60% N/A N/A Classification: Grade 2 N/A N/A Exudate Amount: Medium N/A N/A Exudate Type: Serosanguineous N/A N/A Exudate Color: red, brown N/A N/A Foul Odor After Cleansing: Yes N/A N/A Odor Anticipated Due to Product No N/A N/A Use: Granulation Amount: Small (1-33%) N/A N/A Granulation Quality: Red, Pink, Hyper-granulation N/A N/A Necrotic Amount: Large (67-100%) N/A N/A Necrotic Tissue: Eschar, Adherent Slough N/A N/A Exposed Structures: Fat Layer (Subcutaneous Tissue): N/A N/A Yes Fascia: No Tendon: No Muscle: No Joint: No Bone: No Epithelialization: Small (1-33%) N/A N/A Treatment Notes Electronic Signature(s) Signed: 07/02/2021 4:25:13 PM By: Shella Maxim, Virgil Benedict ([id number]) Entered By: Massie Kluver on 07/01/2021 13:44:46 Shane Bean ([id number]) -------------------------------------------------------------------------------- Multi-Disciplinary Care Plan Details Patient Name: Shane Bean Date of Service: 07/01/2021 1:30 PM Medical Record  Number: [id number] Patient Account Number: [id number] Date of Birth/Sex: 06-15-1955 (66 y.o. M) Treating RN: Carlene Coria Primary Care Itzel Mckibbin: Reeves Dam Other Clinician: Massie Kluver Referring Yaritza Leist: Reeves Dam Treating Zyiah Withington/Extender: Skipper Cliche in Treatment: 12 Active Inactive Necrotic Tissue Nursing Diagnoses: Impaired tissue integrity related to necrotic/devitalized tissue Knowledge deficit related to management of necrotic/devitalized tissue Goals: Necrotic/devitalized tissue will be minimized in the wound bed Date Initiated: 04/15/2021 Target Resolution Date: 04/15/2021 Goal Status: Active Patient/caregiver will verbalize understanding of reason and process for debridement of necrotic tissue Date Initiated: 04/15/2021 Date Inactivated: 05/20/2021 Target Resolution Date: 04/15/2021 Goal Status: Met Interventions: Assess patient pain level pre-, during and post  procedure and prior to discharge Provide education on necrotic tissue and debridement process Notes: Wound/Skin Impairment Nursing Diagnoses: Knowledge deficit related to ulceration/compromised skin integrity Goals: Patient/caregiver will verbalize understanding of skin care regimen Date Initiated: 04/08/2021 Date Inactivated: 05/04/2021 Target Resolution Date: 05/08/2021 Goal Status: Met Ulcer/skin breakdown will have a volume reduction of 30% by week 4 Date Initiated: 04/08/2021 Target Resolution Date: 06/08/2021 Goal Status: Active Ulcer/skin breakdown will have a volume reduction of 50% by week 8 Date Initiated: 04/08/2021 Target Resolution Date: 07/08/2021 Goal Status: Active Ulcer/skin breakdown will have a volume reduction of 80% by week 12 Date Initiated: 04/08/2021 Target Resolution Date: 08/08/2021 Goal Status: Active Ulcer/skin breakdown will heal within 14 weeks Date Initiated: 04/08/2021 Target Resolution Date: 09/08/2021 Goal Status: Active Interventions: Assess patient/caregiver ability to obtain necessary supplies Assess patient/caregiver ability to perform ulcer/skin care regimen upon admission and as needed Assess ulceration(s) every visit Notes: Electronic Signature(s) Signed: 07/02/2021 10:10:35 AM By: Carlene Coria RN Signed: 07/02/2021 4:25:13 PM By: Tina Griffiths ([id number]) Entered By: Massie Kluver on 07/01/2021 13:44:32 Shane Bean ([id number]) -------------------------------------------------------------------------------- Pain Assessment Details Patient Name: Shane Bean Date of Service: 07/01/2021 1:30 PM Medical Record Number: [id number] Patient Account Number: [id number] Date of Birth/Sex: 1955/03/27 (66 y.o. M) Treating RN: Carlene Coria Primary Care Denni France: Reeves Dam Other Clinician: Massie Kluver Referring Taos Tapp: Reeves Dam Treating Marl Seago/Extender: Skipper Cliche in Treatment: 12 Active Problems Location  of Pain Severity and Description of Pain Patient Has Paino No Site Locations Pain Management and Medication Current Pain Management: Electronic Signature(s) Signed: 07/02/2021 10:10:35 AM By: Carlene Coria RN Signed: 07/02/2021 4:25:13 PM By: Massie Kluver Entered By: Massie Kluver on 07/01/2021 13:23:37 Shane Bean ([id number]) -------------------------------------------------------------------------------- Patient/Caregiver Education Details Patient Name: Shane Bean Date of Service: 07/01/2021 1:30 PM Medical Record Number: [id number] Patient Account Number: [id number] Date of Birth/Gender: 08-05-1955 (66 y.o. M) Treating RN: Carlene Coria Primary Care Physician: Reeves Dam Other Clinician: Massie Kluver Referring Physician: Reeves Dam Treating Physician/Extender: Skipper Cliche in Treatment: 12 Education Assessment Education Provided To: Patient Education Topics Provided Wound/Skin Impairment: Handouts: Other: continue with wound care as directed Methods: Explain/Verbal Responses: State content correctly Electronic Signature(s) Signed: 07/02/2021 4:25:13 PM By: Massie Kluver Entered By: Massie Kluver on 07/01/2021 14:20:02 Shane Bean ([id number]) -------------------------------------------------------------------------------- Wound Assessment Details Patient Name: Shane Bean Date of Service: 07/01/2021 1:30 PM Medical Record Number: [id number] Patient Account Number: [id number] Date of Birth/Sex: 01-15-1955 (66 y.o. M) Treating RN: Carlene Coria Primary Care Karmela Bram: Reeves Dam Other Clinician: Massie Kluver Referring Artice Bergerson: Reeves Dam Treating Chenelle Benning/Extender: Jeri Cos Weeks in Treatment: 12 Wound Status Wound Number: 3 Primary Etiology: Diabetic Wound/Ulcer of the Lower Extremity Wound Location: Right, Lateral, Circumferential Lower Leg Wound Status: Open Wounding Event: Gradually Appeared Comorbid History:  Hypertension, Type II Diabetes, Neuropathy  Date Acquired: 02/03/2021 Weeks Of Treatment: 12 Clustered Wound: No Photos Wound Measurements Length: (cm) 8.5 Width: (cm) 12.7 Depth: (cm) 0.1 Area: (cm) 84.784 Volume: (cm) 8.478 % Reduction in Area: -18.6% % Reduction in Volume: -18.6% Epithelialization: Small (1-33%) Wound Description Classification: Grade 2 Exudate Amount: Medium Exudate Type: Serosanguineous Exudate Color: red, brown Foul Odor After Cleansing: Yes Due to Product Use: No Slough/Fibrino No Wound Bed Granulation Amount: Small (1-33%) Exposed Structure Granulation Quality: Red, Pink, Hyper-granulation Fascia Exposed: No Necrotic Amount: Large (67-100%) Fat Layer (Subcutaneous Tissue) Exposed: Yes Necrotic Quality: Eschar, Adherent Slough Tendon Exposed: No Muscle Exposed: No Joint Exposed: No Bone Exposed: No Treatment Notes Wound #3 (Lower Leg) Wound Laterality: Right, Lateral, Circumferential Cleanser Soap and Water Discharge Instruction: Gently cleanse wound with antibacterial soap, rinse and pat dry prior to dressing wounds Wound Cleanser Discharge Instruction: Wash your hands with soap and water. Remove old dressing, discard into plastic bag and place into trash. Cleanse the wound with Wound Cleanser prior to applying a clean dressing using gauze sponges, not tissues or cotton balls. Do not Shane Bean, Shane V. ([id number]) scrub or use excessive force. Pat dry using gauze sponges, not tissue or cotton balls. Peri-Wound Care Topical Gentamicin Discharge Instruction: Apply thin film topically until [facility] antibiotic recieved Primary Dressing Xeroform 5x9-HBD (in/in) Discharge Instruction: Apply Xeroform 5x9-HBD (in/in) as directed Secondary Dressing ABD Pad 5x9 (in/in) Discharge Instruction: 2 pads to Cover with ABD pad Secured With Compression Wrap 3-LAYER WRAP - Profore Lite LF 3 Multilayer Compression Bandaging System Discharge Instruction:  Apply 3 multi-layer wrap as prescribed. Compression Stockings Add-Ons Electronic Signature(s) Signed: 07/02/2021 10:10:35 AM By: Carlene Coria RN Signed: 07/02/2021 4:25:13 PM By: Massie Kluver Entered By: Massie Kluver on 07/01/2021 13:40:05 Shane Bean ([id number]) -------------------------------------------------------------------------------- Vitals Details Patient Name: Shane Bean Date of Service: 07/01/2021 1:30 PM Medical Record Number: [id number] Patient Account Number: [id number] Date of Birth/Sex: [birth year] (66 y.o. M) Treating RN: Carlene Coria Primary Care Siriah Treat: Reeves Dam Other Clinician: Massie Kluver Referring Jediah Horger: Reeves Dam Treating Dyshon Philbin/Extender: Skipper Cliche in Treatment: 12 Vital Signs Time Taken: 13:21 Temperature (F): 98.1 Height (in): 70 Pulse (bpm): 69 Weight (lbs): 277 Respiratory Rate (breaths/min): 8 Body Mass Index (BMI): 39.7 Blood Pressure (mmHg): 134/71 Reference Range: 80 - 120 mg / dl Electronic Signature(s) Signed: 07/02/2021 4:25:13 PM By: Massie Kluver Entered By: Massie Kluver on 07/01/2021 13:23:31

## 2021-07-07 ENCOUNTER — Encounter: Payer: Medicare (Managed Care) | Attending: Internal Medicine | Admitting: Internal Medicine

## 2021-07-07 DIAGNOSIS — I10 Essential (primary) hypertension: Secondary | ICD-10-CM | POA: Insufficient documentation

## 2021-07-07 DIAGNOSIS — E1142 Type 2 diabetes mellitus with diabetic polyneuropathy: Secondary | ICD-10-CM | POA: Insufficient documentation

## 2021-07-07 DIAGNOSIS — E11622 Type 2 diabetes mellitus with other skin ulcer: Secondary | ICD-10-CM | POA: Insufficient documentation

## 2021-07-07 DIAGNOSIS — I872 Venous insufficiency (chronic) (peripheral): Secondary | ICD-10-CM | POA: Diagnosis not present

## 2021-07-07 DIAGNOSIS — L97812 Non-pressure chronic ulcer of other part of right lower leg with fat layer exposed: Secondary | ICD-10-CM | POA: Insufficient documentation

## 2021-07-07 DIAGNOSIS — L97822 Non-pressure chronic ulcer of other part of left lower leg with fat layer exposed: Secondary | ICD-10-CM | POA: Diagnosis not present

## 2021-07-07 NOTE — Progress Notes (Signed)
WHITMAN, MEINHARDT ([id number]) Visit Report for 07/07/2021 Chief Complaint Document Details Patient Name: Shane Bean, Shane Bean Date of Service: 07/07/2021 2:30 PM Medical Record Number: [id number] Patient Account Number: [id number] Date of Birth/Sex: 1955-07-20 (66 y.o. M) Treating RN: Levora Dredge Primary Care Provider: Reeves Dam Other Clinician: Referring Provider: Reeves Dam Treating Provider/Extender: Yaakov Guthrie in Treatment: 12 Information Obtained from: Patient Chief Complaint Right LE Ulcer Electronic Signature(s) Signed: 07/07/2021 3:18:19 PM By: Kalman Shan DO Entered By: Kalman Shan on 07/07/2021 15:15:05 Shane Bean ([id number]) -------------------------------------------------------------------------------- Debridement Details Patient Name: Shane Bean Date of Service: 07/07/2021 2:30 PM Medical Record Number: [id number] Patient Account Number: [id number] Date of Birth/Sex: 1955/06/11 (66 y.o. M) Treating RN: Levora Dredge Primary Care Provider: Reeves Dam Other Clinician: Referring Provider: Reeves Dam Treating Provider/Extender: Yaakov Guthrie in Treatment: 12 Debridement Performed for Wound #3 Right,Lateral,Circumferential Lower Leg Assessment: Performed By: Physician Kalman Shan, MD Debridement Type: Debridement Severity of Tissue Pre Debridement: Fat layer exposed Level of Consciousness (Pre- Awake and Alert procedure): Pre-procedure Verification/Time Out Yes - 15:05 Taken: Pain Control: Lidocaine 4% Topical Solution Total Area Debrided (L x W): 12.7 (cm) x 9 (cm) = 114.3 (cm) Tissue and other material Viable, Non-Viable, Slough, Subcutaneous, Slough debrided: Level: Skin/Subcutaneous Tissue Debridement Description: Excisional Instrument: Curette Bleeding: Minimum Hemostasis Achieved: Pressure Response to Treatment: Procedure was tolerated well Level of Consciousness (Post- Awake and  Alert procedure): Post Debridement Measurements of Total Wound Length: (cm) 12.7 Width: (cm) 9 Depth: (cm) 0.1 Volume: (cm) 8.977 Character of Wound/Ulcer Post Debridement: Stable Severity of Tissue Post Debridement: Fat layer exposed Post Procedure Diagnosis Same as Pre-procedure Electronic Signature(s) Signed: 07/07/2021 3:18:19 PM By: Kalman Shan DO Signed: 07/07/2021 4:13:21 PM By: Levora Dredge Entered By: Levora Dredge on 07/07/2021 15:06:25 Shane Bean ([id number]) -------------------------------------------------------------------------------- HPI Details Patient Name: Shane Bean Date of Service: 07/07/2021 2:30 PM Medical Record Number: [id number] Patient Account Number: [id number] Date of Birth/Sex: 07/24/1955 (66 y.o. M) Treating RN: Levora Dredge Primary Care Provider: Reeves Dam Other Clinician: Referring Provider: Reeves Dam Treating Provider/Extender: Yaakov Guthrie in Treatment: 12 History of Present Illness HPI Description: 05/18/2020 upon evaluation today patient appears for initial evaluation here in clinic concerning issues that he has been having with the wound on his left lateral leg. Fortunately there does not appear to be any signs of obvious an active infection at this time which is great news. With that being said the patient unfortunately is continued to have issues with pain although he tells me it is gotten a lot better. He was on Keflex as well as Bactrim DS which seems to have done a good job there. His most recent hemoglobin A1c was 9.8 that was on 04/14/2020. Subsequently I do feel like that the patient is making progress here. He does have a history of chronic venous insufficiency as well as hypertension. I do not see any need for antibiotics at this point. 5/25; follow-up of the wound on the left posterior calf. This is completely necrotic on the surface. It does not look infected but it is painful. He is a diabetic  but I think there is some suggestion here of chronic venous disease as well. We used Iodoflex under compression last week 6/1; again a completely necrotic surface on this with the wound. We have been using Iodoflex under compression he complains of gnawing pain. He has had wounds previously a lot of this looks like chronic venous disease. 6/8; about two thirds of the surface  of this wound is still covered in a black necrotic eschar. I changed him from Iodoflex to Weir last week because of complaints of pain. He actually was seen by her neurologist this weeko Peripheral neuropathy as a cause of the pain and they changed him from Neurontin to Lyrica but he still has not had any relief. He says that most of the pain however is in his heel not in his wound per se. He is a diabetic 6/15. This is a patient with what looks to be a venous wound on the left lateral lower leg. He is also a diabetic. Currently being worked up for diabetic neuropathy. He complains of pain out of proportion to the size of the wound although to be fair the entire area here was eschared. We have been working to get a viable surface. We are using Sorbact 6/22; fairly painful wound on the left posterior calf. I am assuming this is been venous he is also a diabetic. His ABI in our clinic was noncompressible however he has easily palpable pulses on his feet. He complains of a lot of pain in the wound also of the left heel and the upper left calf. Some of this may be neuropathy. It is led me to discontinue Iodoflex reduce his compression but he still complains of pain in fact he says he could not take a debridement today. When I first saw this it was completely necrotic surface we have got it down to something that looks a reasonably healthy I have been wondering about biopsying this area however he is on Coumadin and with the pain I have put this off. The major question would be an inflammatory ulcer such as pyoderma. The patient is  not aware of how this started. He does however have a wound history on both legs. 6/29; patient presents for 1 week follow-up. He has been using Hydrofera Blue under Kerlix/Coban. He has tolerated this well. He currently denies signs of infection. He also declines debridement today. He he states it is painful and does not want to have this done. 7/6; Patient presents for 1 week follow-up. He has been using Hydrofera Blue under Kerlix/Coban. He denies signs of infection. He declines debridement today. 7/15; patient presents for 1 week follow-up. He has been using Hydrofera Blue under Kerlix/Coban. He denies signs of infection today. He is agreeable to having debridement done today. 7/20; patient presents for 1 week follow-up. He is in good spirits today. He has been using Hydrofera Blue under Kerlix/Coban. He denies signs of infection today. 7/27; patient presents for 1 week follow-up. He is in good spirits today. He has no issues or complaints today. He denies signs of infection. 08/12/2020 upon evaluation today patient appears to be doing better in regard to his wound. He has been tolerating the dressing changes without complication. With that being said he did have arterial studies and does show that he has a TBI on the right of 0.90 and a TBI on the left of 0.76 which is excellent. There is no evidence of arterial compromise at this point. 8/17; patient presents for 1 week follow-up. He has no issues or complaints today. He denies signs of infection. 8/24; patient presents for 1 week follow-up. He has no issues or complaints today. He denies signs of infection. He continues to tolerate the compression wrap well. 8/31; patient presents for 1 week follow-up. He has no issues or complaints today. He denies signs of infection. He is in good spirits today.  9/7; patient presents for 1 week follow-up. He is tolerated the compression wrap well. He has no issues or complaints today. He denies signs  of infection. 9/14; patient presents for 1 week follow-up. He has no issues or complaints today. He denies signs of infection. He is in excellent spirits today. 9/21; patient presents for 1 week follow-up. He has no issues or complaints today. 9/28; patient presents for 1 week follow-up. He has tolerated the compression wrap well. He has no issues or complaints today. 10/5; patient presents for 1 week follow-up. He has no issues or complaints today. 10/12; patient presents for 1 week follow-up. He has no issues or complaints today. He denies signs of infection. Shane Bean, Shane Bean ([id number]) Readmission: 04-08-2021 upon evaluation today patient appears to be doing poorly in regard to his right lower extremity. He is being seen for reevaluation here in the clinic he was last discharged healed in October 2022. Subsequently at that time it was noted in the chart that he was told to be wearing compression socks unfortunately the patient tells me he never remembers being told that. I did discuss this with him today as well as Hosie Poisson discussing it with him today again. Obviously he does know at this point once he is healed he should be wearing compression socks all the time from the time he gets up in the morning to when he goes to sleep at night day in and day out. I think this is the only way that you can to keep things under control. Otherwise his medical history has not changed his arterial study in the past was excellent and did not appear to show any signs of issue at this point which is great news as well. 04-15-2021 upon evaluation today patient's wound is actually looking much drier than what it was last week. Again I do believe the infection is showing signs of improvement although he is having pain for sore I think this has to do more with the dryness of the wound at this time as opposed to the infection that we were noted in the last week. Fortunately I do not see any evidence of active  infection locally or systemically at this time which is great news. 04-22-2021 upon evaluation today patient unfortunately continues to have issues with his leg. This is getting significantly worse despite being on Augmentin. I am actually extremely concerned about worsening infection and especially considering the way the wound looks today and the odor present. 05-04-2021 upon evaluation today patient appears to be doing better with regard to the infection in his leg. He has been tolerating the dressing changes without complication and overall I am extremely pleased with where we stand. There does not appear to be any evidence of active infection locally or systemically which is great news. 05-13-2021 upon evaluation today patient appears to be doing better in regard to his leg ulcer. He has been tolerating the dressing changes without complication. He does have still significant pain he does have a lot of necrotic tissue which is working its way off and there is no signs of significant infection at this point which is great news. With that being said I do believe that the patient does need to likely continue with the wound care measures at this point with regard to the dressing that we are utilizing. 05-20-2021 upon evaluation today patient's wound is actually showing signs of fairly good granulation and epithelization in a lot of areas he still has some slough noted  at this point though a lot of the eschar is softening up I do believe the Xeroform has been helpful in this regard. Fortunately I do not see any evidence of active infection locally or systemically which is great news. No fevers, chills, nausea, vomiting, or diarrhea. I do believe the 3 layer compression wrap has been beneficial. 05-27-2021 upon evaluation today patient's wound is actually showing signs of significant improvement with a lot of new epithelial growth at this point. Fortunately I do not see any signs of active infection  locally or systemically which is great news and overall I think we are headed in the right direction. 06-03-2021 upon evaluation today patient appears to be doing well with regard to his wounds although he still has a lot of slough and biofilm buildup noted. We are going to need to try to do some sharp debridement today to clear this away and he is in agreement with allowing me to try what we can. I am not certain how much working to be able to remove or not. 06-10-2021 upon evaluation today patient appears to be doing well with regard to his wound from the overall appearance and size standpoint this is good. From a drainage standpoint and the odor as well this seems to still be infected in my opinion. I do not think the Augmentin was sufficient. With that being said I discussed this with the patient I do believe that he may benefit from topical [facility] antibiotics in the meantime we will get a use gentamicin to bridge the gap until we get those. 06-17-2021 upon evaluation today patient appears to be doing better in regard to his leg. I have not been able to get him the [facility] as it was too expensive for the topical antibiotics. For that reason would been using gentamicin which I do feel like is helping and I do think that is probably can to be our best bet at this point all things considered. He is in agreement with giving this a trial and seeing how things go. Fortunately I see no evidence of active infection systemically locally some of the odor seems indicate there may be some bacteria present but I am not sure what is best to do past what we are already doing. 06-24-2021 upon evaluation today patient appears to be doing well with regard to his healing in fact he is got more epithelial growth than what we have seen previous. Fortunately I think that each week I see him this is looking better and better. Fortunately I see no signs of active infection at this time locally or systemically which is  great news. 07-01-2021 upon evaluation today patient appears to be doing excellent in regard to his leg ulcers. He has been tolerating the dressing changes without complication. Fortunately I see no evidence of active infection locally or systemically at this time which is great news. Overall I am extremely happy with where we stand today. 7/5; patient presents for follow-up. We have been using gentamicin and Xeroform under compression therapy. He has no issues or complaints today. Electronic Signature(s) Signed: 07/07/2021 3:18:19 PM By: Kalman Shan DO Entered By: Kalman Shan on 07/07/2021 15:15:34 Shane Bean ([id number]) -------------------------------------------------------------------------------- Physical Exam Details Patient Name: Shane Bean Date of Service: 07/07/2021 2:30 PM Medical Record Number: [id number] Patient Account Number: [id number] Date of Birth/Sex: 1955-04-05 (65 y.o. M) Treating RN: Levora Dredge Primary Care Provider: Reeves Dam Other Clinician: Referring Provider: Reeves Dam Treating Provider/Extender: Yaakov Guthrie in Treatment:  12 Constitutional . Cardiovascular . Psychiatric . Notes Right lower extremity: To the lateral aspect there are 2 small open wounds with granulation tissue and slough. [place] of epithelization in between the 2 wound beds where there was a previous wound. No signs of surrounding infection. Electronic Signature(s) Signed: 07/07/2021 3:18:19 PM By: Kalman Shan DO Entered By: Kalman Shan on 07/07/2021 15:16:40 Shane Bean ([id number]) -------------------------------------------------------------------------------- Physician Orders Details Patient Name: Shane Bean Date of Service: 07/07/2021 2:30 PM Medical Record Number: [id number] Patient Account Number: [id number] Date of Birth/Sex: 15-Jul-1955 (65 y.o. M) Treating RN: Levora Dredge Primary Care Provider: Reeves Dam  Other Clinician: Referring Provider: Reeves Dam Treating Provider/Extender: Yaakov Guthrie in Treatment: 12 Verbal / Phone Orders: No Diagnosis Coding Follow-up Appointments o Return Appointment in 1 week. - 3 layer wrap every week o Nurse Visit as needed Bathing/ Shower/ Hygiene o May shower; gently cleanse wound with antibacterial soap, rinse and pat dry prior to dressing wounds o May shower with wound dressing protected with water repellent cover or cast protector. o No tub bath. Anesthetic (Use 'Patient Medications' Section for Anesthetic Order Entry) o Lidocaine applied to wound bed Edema Control - Lymphedema / Segmental Compressive Device / Other o Elevate, Exercise Daily and Avoid Standing for Long Periods of Time. o Elevate legs to the level of the heart and pump ankles as often as possible o Elevate leg(s) parallel to the floor when sitting. Additional Orders / Instructions o Follow Nutritious Diet and Increase Protein Intake - monitor blood sugar Wound Treatment Wound #3 - Lower Leg Wound Laterality: Right, Lateral, Circumferential Cleanser: Soap and Water 1 x Per Week/15 Days Discharge Instructions: Gently cleanse wound with antibacterial soap, rinse and pat dry prior to dressing wounds Cleanser: Wound Cleanser 1 x Per Week/15 Days Discharge Instructions: Wash your hands with soap and water. Remove old dressing, discard into plastic bag and place into trash. Cleanse the wound with Wound Cleanser prior to applying a clean dressing using gauze sponges, not tissues or cotton balls. Do not scrub or use excessive force. Pat dry using gauze sponges, not tissue or cotton balls. Topical: Gentamicin 1 x Per Week/15 Days Discharge Instructions: Apply thin film topically until [facility] antibiotic recieved Primary Dressing: Xeroform 5x9-HBD (in/in) 1 x Per Week/15 Days Discharge Instructions: Apply Xeroform 5x9-HBD (in/in) as directed Secondary Dressing:  ABD Pad 5x9 (in/in) 1 x Per Week/15 Days Discharge Instructions: 2 pads to Cover with ABD pad Compression Wrap: 3-LAYER WRAP - Profore Lite LF 3 Multilayer Compression Bandaging System 1 x Per Week/15 Days Discharge Instructions: Apply 3 multi-layer wrap as prescribed. Electronic Signature(s) Signed: 07/07/2021 3:30:43 PM By: Kalman Shan DO Signed: 07/07/2021 4:13:21 PM By: Levora Dredge Previous Signature: 07/07/2021 3:18:19 PM Version By: Kalman Shan DO Entered By: Levora Dredge on 07/07/2021 15:20:03 Shane Bean ([id number]) -------------------------------------------------------------------------------- Problem List Details Patient Name: Shane Bean Date of Service: 07/07/2021 2:30 PM Medical Record Number: [id number] Patient Account Number: [id number] Date of Birth/Sex: 02/19/1955 (65 y.o. M) Treating RN: Levora Dredge Primary Care Provider: Reeves Dam Other Clinician: Referring Provider: Reeves Dam Treating Provider/Extender: Yaakov Guthrie in Treatment: 12 Active Problems ICD-10 Encounter Code Description Active Date MDM Diagnosis E11.622 Type 2 diabetes mellitus with other skin ulcer 04/08/2021 No Yes I87.2 Venous insufficiency (chronic) (peripheral) 04/08/2021 No Yes 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 layer 04/08/2021 No Yes exposed L97.812 Non-pressure chronic ulcer of other part of right lower leg with fat layer  04/08/2021 No Yes exposed Shane Bean (primary) hypertension 04/08/2021 No Yes Inactive Problems Resolved Problems Electronic Signature(s) Signed: 07/07/2021 3:18:19 PM By: Kalman Shan DO Entered By: Kalman Shan on 07/07/2021 15:14:38 Shane Bean ([id number]) -------------------------------------------------------------------------------- Progress Note Details Patient Name: Shane Bean Date of Service: 07/07/2021 2:30 PM Medical Record Number: [id number] Patient  Account Number: [id number] Date of Birth/Sex: 1955-11-14 (65 y.o. M) Treating RN: Levora Dredge Primary Care Provider: Reeves Dam Other Clinician: Referring Provider: Reeves Dam Treating Provider/Extender: Yaakov Guthrie in Treatment: 12 Subjective Chief Complaint Information obtained from Patient Right LE Ulcer History of Present Illness (HPI) 05/18/2020 upon evaluation today patient appears for initial evaluation here in clinic concerning issues that he has been having with the wound on his left lateral leg. Fortunately there does not appear to be any signs of obvious an active infection at this time which is great news. With that being said the patient unfortunately is continued to have issues with pain although he tells me it is gotten a lot better. He was on Keflex as well as Bactrim DS which seems to have done a good job there. His most recent hemoglobin A1c was 9.8 that was on 04/14/2020. Subsequently I do feel like that the patient is making progress here. He does have a history of chronic venous insufficiency as well as hypertension. I do not see any need for antibiotics at this point. 5/25; follow-up of the wound on the left posterior calf. This is completely necrotic on the surface. It does not look infected but it is painful. He is a diabetic but I think there is some suggestion here of chronic venous disease as well. We used Iodoflex under compression last week 6/1; again a completely necrotic surface on this with the wound. We have been using Iodoflex under compression he complains of gnawing pain. He has had wounds previously a lot of this looks like chronic venous disease. 6/8; about two thirds of the surface of this wound is still covered in a black necrotic eschar. I changed him from Iodoflex to Clara last week because of complaints of pain. He actually was seen by her neurologist this weeko Peripheral neuropathy as a cause of the pain and they changed him from  Neurontin to Lyrica but he still has not had any relief. He says that most of the pain however is in his heel not in his wound per se. He is a diabetic 6/15. This is a patient with what looks to be a venous wound on the left lateral lower leg. He is also a diabetic. Currently being worked up for diabetic neuropathy. He complains of pain out of proportion to the size of the wound although to be fair the entire area here was eschared. We have been working to get a viable surface. We are using Sorbact 6/22; fairly painful wound on the left posterior calf. I am assuming this is been venous he is also a diabetic. His ABI in our clinic was noncompressible however he has easily palpable pulses on his feet. He complains of a lot of pain in the wound also of the left heel and the upper left calf. Some of this may be neuropathy. It is led me to discontinue Iodoflex reduce his compression but he still complains of pain in fact he says he could not take a debridement today. When I first saw this it was completely necrotic surface we have got it down to something that looks a reasonably healthy I have been  wondering about biopsying this area however he is on Coumadin and with the pain I have put this off. The major question would be an inflammatory ulcer such as pyoderma. The patient is not aware of how this started. He does however have a wound history on both legs. 6/29; patient presents for 1 week follow-up. He has been using Hydrofera Blue under Kerlix/Coban. He has tolerated this well. He currently denies signs of infection. He also declines debridement today. He he states it is painful and does not want to have this done. 7/6; Patient presents for 1 week follow-up. He has been using Hydrofera Blue under Kerlix/Coban. He denies signs of infection. He declines debridement today. 7/15; patient presents for 1 week follow-up. He has been using Hydrofera Blue under Kerlix/Coban. He denies signs of infection  today. He is agreeable to having debridement done today. 7/20; patient presents for 1 week follow-up. He is in good spirits today. He has been using Hydrofera Blue under Kerlix/Coban. He denies signs of infection today. 7/27; patient presents for 1 week follow-up. He is in good spirits today. He has no issues or complaints today. He denies signs of infection. 08/12/2020 upon evaluation today patient appears to be doing better in regard to his wound. He has been tolerating the dressing changes without complication. With that being said he did have arterial studies and does show that he has a TBI on the right of 0.90 and a TBI on the left of 0.76 which is excellent. There is no evidence of arterial compromise at this point. 8/17; patient presents for 1 week follow-up. He has no issues or complaints today. He denies signs of infection. 8/24; patient presents for 1 week follow-up. He has no issues or complaints today. He denies signs of infection. He continues to tolerate the compression wrap well. 8/31; patient presents for 1 week follow-up. He has no issues or complaints today. He denies signs of infection. He is in good spirits today. 9/7; patient presents for 1 week follow-up. He is tolerated the compression wrap well. He has no issues or complaints today. He denies signs of infection. 9/14; patient presents for 1 week follow-up. He has no issues or complaints today. He denies signs of infection. He is in excellent spirits today. 9/21; patient presents for 1 week follow-up. He has no issues or complaints today. Shane Bean, Shane Bean ([id number]) 9/28; patient presents for 1 week follow-up. He has tolerated the compression wrap well. He has no issues or complaints today. 10/5; patient presents for 1 week follow-up. He has no issues or complaints today. 10/12; patient presents for 1 week follow-up. He has no issues or complaints today. He denies signs of infection. Readmission: 04-08-2021 upon  evaluation today patient appears to be doing poorly in regard to his right lower extremity. He is being seen for reevaluation here in the clinic he was last discharged healed in October 2022. Subsequently at that time it was noted in the chart that he was told to be wearing compression socks unfortunately the patient tells me he never remembers being told that. I did discuss this with him today as well as Hosie Poisson discussing it with him today again. Obviously he does know at this point once he is healed he should be wearing compression socks all the time from the time he gets up in the morning to when he goes to sleep at night day in and day out. I think this is the only way that you can to keep things  under control. Otherwise his medical history has not changed his arterial study in the past was excellent and did not appear to show any signs of issue at this point which is great news as well. 04-15-2021 upon evaluation today patient's wound is actually looking much drier than what it was last week. Again I do believe the infection is showing signs of improvement although he is having pain for sore I think this has to do more with the dryness of the wound at this time as opposed to the infection that we were noted in the last week. Fortunately I do not see any evidence of active infection locally or systemically at this time which is great news. 04-22-2021 upon evaluation today patient unfortunately continues to have issues with his leg. This is getting significantly worse despite being on Augmentin. I am actually extremely concerned about worsening infection and especially considering the way the wound looks today and the odor present. 05-04-2021 upon evaluation today patient appears to be doing better with regard to the infection in his leg. He has been tolerating the dressing changes without complication and overall I am extremely pleased with where we stand. There does not appear to be any evidence of  active infection locally or systemically which is great news. 05-13-2021 upon evaluation today patient appears to be doing better in regard to his leg ulcer. He has been tolerating the dressing changes without complication. He does have still significant pain he does have a lot of necrotic tissue which is working its way off and there is no signs of significant infection at this point which is great news. With that being said I do believe that the patient does need to likely continue with the wound care measures at this point with regard to the dressing that we are utilizing. 05-20-2021 upon evaluation today patient's wound is actually showing signs of fairly good granulation and epithelization in a lot of areas he still has some slough noted at this point though a lot of the eschar is softening up I do believe the Xeroform has been helpful in this regard. Fortunately I do not see any evidence of active infection locally or systemically which is great news. No fevers, chills, nausea, vomiting, or diarrhea. I do believe the 3 layer compression wrap has been beneficial. 05-27-2021 upon evaluation today patient's wound is actually showing signs of significant improvement with a lot of new epithelial growth at this point. Fortunately I do not see any signs of active infection locally or systemically which is great news and overall I think we are headed in the right direction. 06-03-2021 upon evaluation today patient appears to be doing well with regard to his wounds although he still has a lot of slough and biofilm buildup noted. We are going to need to try to do some sharp debridement today to clear this away and he is in agreement with allowing me to try what we can. I am not certain how much working to be able to remove or not. 06-10-2021 upon evaluation today patient appears to be doing well with regard to his wound from the overall appearance and size standpoint this is good. From a drainage standpoint  and the odor as well this seems to still be infected in my opinion. I do not think the Augmentin was sufficient. With that being said I discussed this with the patient I do believe that he may benefit from topical [place] antibiotics in the meantime we will get a use gentamicin  to bridge the gap until we get those. 06-17-2021 upon evaluation today patient appears to be doing better in regard to his leg. I have not been able to get him the [facility] as it was too expensive for the topical antibiotics. For that reason would been using gentamicin which I do feel like is helping and I do think that is probably can to be our best bet at this point all things considered. He is in agreement with giving this a trial and seeing how things go. Fortunately I see no evidence of active infection systemically locally some of the odor seems indicate there may be some bacteria present but I am not sure what is best to do past what we are already doing. 06-24-2021 upon evaluation today patient appears to be doing well with regard to his healing in fact he is got more epithelial growth than what we have seen previous. Fortunately I think that each week I see him this is looking better and better. Fortunately I see no signs of active infection at this time locally or systemically which is great news. 07-01-2021 upon evaluation today patient appears to be doing excellent in regard to his leg ulcers. He has been tolerating the dressing changes without complication. Fortunately I see no evidence of active infection locally or systemically at this time which is great news. Overall I am extremely happy with where we stand today. 7/5; patient presents for follow-up. We have been using gentamicin and Xeroform under compression therapy. He has no issues or complaints today. Objective DEMARIAN, EPPS ([id number]) Constitutional Vitals Time Taken: 2:42 PM, Height: 70 in, Weight: 277 lbs, BMI: 39.7, Temperature: 98.5 F,  Pulse: 71 bpm, Respiratory Rate: 18 breaths/min, Blood Pressure: 129/74 mmHg. General Notes: Right lower extremity: To the lateral aspect there are 2 small open wounds with granulation tissue and slough. Islands of epithelization in between the 2 wound beds where there was a previous wound. No signs of surrounding infection. Integumentary (Hair, Skin) Wound #3 status is Open. Original cause of wound was Gradually Appeared. The date acquired was: 02/03/2021. The wound has been in treatment 12 weeks. The wound is located on the Right,Lateral,Circumferential Lower Leg. The wound measures 12.7cm length x 9cm width x 0.1cm depth; 89.771cm^2 area and 8.977cm^3 volume. There is Fat Layer (Subcutaneous Tissue) exposed. There is no tunneling or undermining noted. There is a medium amount of serosanguineous drainage noted. Foul odor after cleansing was noted. There is medium (34-66%) red, pink, hyper - granulation within the wound bed. There is a medium (34-66%) amount of necrotic tissue within the wound bed including Eschar and Adherent Slough. Assessment Active Problems ICD-10 Type 2 diabetes mellitus with other skin ulcer Venous insufficiency (chronic) (peripheral) Non-pressure chronic ulcer of other part of left lower leg with fat layer exposed Non-pressure chronic ulcer of other part of right lower leg with fat layer exposed Essential (primary) hypertension Patient's wound has shown improvement in size and appearance since last clinic visit. I debrided nonviable tissue. I recommended continuing the course with Xeroform and gentamicin ointment under compression therapy. Follow-up in 1 week. Procedures Wound #3 Pre-procedure diagnosis of Wound #3 is a Diabetic Wound/Ulcer of the Lower Extremity located on the Right,Lateral,Circumferential Lower Leg .Severity of Tissue Pre Debridement is: Fat layer exposed. There was a Excisional Skin/Subcutaneous Tissue Debridement with a total area of 114.3 sq cm  performed by Kalman Shan, MD. With the following instrument(s): Curette to remove Viable and Non-Viable tissue/material. Material removed includes Subcutaneous Tissue  and Slough and after achieving pain control using Lidocaine 4% Topical Solution. No specimens were taken. A time out was conducted at 15:05, prior to the start of the procedure. A Minimum amount of bleeding was controlled with Pressure. The procedure was tolerated well. Post Debridement Measurements: 12.7cm length x 9cm width x 0.1cm depth; 8.977cm^3 volume. Character of Wound/Ulcer Post Debridement is stable. Severity of Tissue Post Debridement is: Fat layer exposed. Post procedure Diagnosis Wound #3: Same as Pre-Procedure Plan 1. In office sharp debridement 2. Gentamicin ointment and Xeroform under 3 layer compression 3. Follow-up in 1 week Electronic Signature(s) Signed: 07/07/2021 3:18:19 PM By: Kalman Shan DO Entered By: Kalman Shan on 07/07/2021 15:17:23 Shane Bean ([id number]) -------------------------------------------------------------------------------- SuperBill Details Patient Name: Shane Bean Date of Service: 07/07/2021 Medical Record Number: [id number] Patient Account Number: [id number] Date of Birth/Sex: [birth year] (65 y.o. M) Treating RN: Levora Dredge Primary Care Provider: Reeves Dam Other Clinician: Referring Provider: Reeves Dam Treating Provider/Extender: Yaakov Guthrie in Treatment: 12 Diagnosis Coding ICD-10 Codes Code Description E11.622 Type 2 diabetes mellitus with other skin ulcer I87.2 Venous insufficiency (chronic) (peripheral) 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 layer exposed L97.812 Non-pressure chronic ulcer of other part of right lower leg with fat layer exposed [place] (primary) hypertension Facility Procedures CPT4 Code: 41287867 Description: 67209 - DEB SUBQ TISSUE 20 SQ  CM/< Modifier: Quantity: 1 CPT4 Code: Description: ICD-10 Diagnosis Description O70.962 Non-pressure chronic ulcer of other part of right lower leg with fat lay Modifier: er exposed Quantity: CPT4 Code: [id number] Description: [place] - DEB SUBQ TISS EA ADDL 20CM Modifier: Quantity: 5 CPT4 Code: Description: ICD-10 Diagnosis Description M54.650 Non-pressure chronic ulcer of other part of right lower leg with fat lay Modifier: er exposed Quantity: Physician Procedures CPT4 Code: [id number] Description: 11042 - WC PHYS SUBQ TISS 20 SQ CM Modifier: Quantity: 1 CPT4 Code: Description: ICD-10 Diagnosis Description L27.517 Non-pressure chronic ulcer of other part of right lower leg with fat laye Modifier: r exposed Quantity: CPT4 Code: [id number] Description: 11045 - WC PHYS SUBQ TISS EA ADDL 20 CM Modifier: Quantity: 5 CPT4 Code: Description: ICD-10 Diagnosis Description W96.759 Non-pressure chronic ulcer of other part of right lower leg with fat laye Modifier: r exposed Quantity: Electronic Signature(s) Signed: 07/07/2021 3:30:43 PM By: Kalman Shan DO Signed: 07/07/2021 4:13:21 PM By: Levora Dredge Previous Signature: 07/07/2021 3:18:19 PM Version By: Kalman Shan DO Entered By: Levora Dredge on 07/07/2021 15:20:17

## 2021-07-07 NOTE — Progress Notes (Signed)
NAHIEM, DREDGE ([id number]) Visit Report for 07/07/2021 Arrival Information Details Patient Name: JIRO, KIESTER Date of Service: 07/07/2021 2:30 PM Medical Record Number: [id number] Patient Account Number: [id number] Date of Birth/Sex: January 06, 1955 (66 y.o. M) Treating RN: Levora Dredge Primary Care Sharay Bellissimo: Reeves Dam Other Clinician: Referring Kay Shippy: Reeves Dam Treating Sharone Picchi/Extender: Yaakov Guthrie in Treatment: 12 Visit Information History Since Last Visit Added or deleted any medications: No Patient Arrived: Ambulatory Any new allergies or adverse reactions: No Arrival Time: 14:39 Had a fall or experienced change in No Accompanied By: self activities of daily living that may affect Transfer Assistance: None risk of falls: Patient Identification Verified: Yes Hospitalized since last visit: No Secondary Verification Process Completed: Yes Has Dressing in Place as Prescribed: Yes Patient Requires Transmission-Based No Has Compression in Place as Prescribed: Yes Precautions: Pain Present Now: No Patient Has Alerts: Yes Patient Alerts: Patient on Blood Thinner ABI R [place] TBI .90 7/22 ABI L 1.46 TBI .76 7/22 Electronic Signature(s) Signed: 07/07/2021 4:13:21 PM By: Levora Dredge Entered By: Levora Dredge on 07/07/2021 14:42:13 [name] ([id number]) -------------------------------------------------------------------------------- Clinic Level of Care Assessment Details Patient Name: [name] Date of Service: 07/07/2021 2:30 PM Medical Record Number: [id number] Patient Account Number: [id number] Date of Birth/Sex: 1955/10/12 (66 y.o. M) Treating RN: Levora Dredge Primary Care Jiovanni Heeter: Reeves Dam Other Clinician: Referring Binnie Vonderhaar: Reeves Dam Treating Brylin Stanislawski/Extender: Yaakov Guthrie in Treatment: 12 Clinic Level of Care Assessment Items TOOL 1 Quantity Score []  - Use when EandM and Procedure is performed on  INITIAL visit 0 ASSESSMENTS - Nursing Assessment / Reassessment []  - General Physical Exam (combine w/ comprehensive assessment (listed just below) when performed on new 0 pt. evals) []  - 0 Comprehensive Assessment (HX, ROS, Risk Assessments, Wounds Hx, etc.) ASSESSMENTS - Wound and Skin Assessment / Reassessment []  - Dermatologic / Skin Assessment (not related to wound area) 0 ASSESSMENTS - Ostomy and/or Continence Assessment and Care []  - Incontinence Assessment and Management 0 []  - 0 Ostomy Care Assessment and Management (repouching, etc.) PROCESS - Coordination of Care []  - Simple Patient / Family Education for ongoing care 0 []  - 0 Complex (extensive) Patient / Family Education for ongoing care []  - 0 Staff obtains Programmer, systems, Records, Test Results / Process Orders []  - 0 Staff telephones HHA, Nursing Homes / Clarify orders / etc []  - 0 Routine Transfer to another Facility (non-emergent condition) []  - 0 Routine Hospital Admission (non-emergent condition) []  - 0 New Admissions / Biomedical engineer / Ordering NPWT, Apligraf, etc. []  - 0 Emergency Hospital Admission (emergent condition) PROCESS - Special Needs []  - Pediatric / Minor Patient Management 0 []  - 0 Isolation Patient Management []  - 0 Hearing / Language / Visual special needs []  - 0 Assessment of Community assistance (transportation, D/C planning, etc.) []  - 0 Additional assistance / Altered mentation []  - 0 Support Surface(s) Assessment (bed, cushion, seat, etc.) INTERVENTIONS - Miscellaneous []  - External ear exam 0 []  - 0 Patient Transfer (multiple staff / Civil Service fast streamer / Similar devices) []  - 0 Simple Staple / Suture removal (25 or less) []  - 0 Complex Staple / Suture removal (26 or more) []  - 0 Hypo/Hyperglycemic Management (do not check if billed separately) []  - 0 Ankle / Brachial Index (ABI) - do not check if billed separately Has the patient been seen at the hospital within the last three  years: Yes Total Score: 0 Level Of Care: ____ [name] ([id number]) Electronic Signature(s) Signed: 07/07/2021 4:13:21  PM By: Levora Dredge Entered By: Levora Dredge on 07/07/2021 15:20:09 [name] ([id number]) -------------------------------------------------------------------------------- Encounter Discharge Information Details Patient Name: [name] Date of Service: 07/07/2021 2:30 PM Medical Record Number: [id number] Patient Account Number: [id number] Date of Birth/Sex: [birth year] (65 y.o. M) Treating RN: Levora Dredge Primary Care Khaya Theissen: Reeves Dam Other Clinician: Referring Thai Hemrick: Reeves Dam Treating Cato Liburd/Extender: Yaakov Guthrie in Treatment: 12 Encounter Discharge Information Items Post Procedure Vitals Discharge Condition: Stable Temperature (F): 98.5 Ambulatory Status: Ambulatory Pulse (bpm): 71 Discharge Destination: Home Respiratory Rate (breaths/min): 18 Transportation: Private Auto Blood Pressure (mmHg): 129/74 Accompanied By: self Schedule Follow-up Appointment: Yes Clinical Summary of Care: Electronic Signature(s) Signed: 07/07/2021 4:13:21 PM By: Levora Dredge Entered By: Levora Dredge on 07/07/2021 15:21:04 [name] ([id number]) -------------------------------------------------------------------------------- Lower Extremity Assessment Details Patient Name: [name] Date of Service: 07/07/2021 2:30 PM Medical Record Number: [id number] Patient Account Number: [id number] Date of Birth/Sex: [birth year] (65 y.o. M) Treating RN: Levora Dredge Primary Care Morrell Fluke: Reeves Dam Other Clinician: Referring Viviano Bir: Reeves Dam Treating Xariah Silvernail/Extender: Yaakov Guthrie in Treatment: 12 Edema Assessment Assessed: [Left: No] [Right: No] Edema: [Left: Ye] [Right: s] Calf Left: Right: Point of Measurement: 34 cm From Medial Instep 41 cm Ankle Left: Right: Point of  Measurement: 11 cm From Medial Instep 25.5 cm Vascular Assessment Pulses: Dorsalis Pedis Palpable: [Right:Yes] Electronic Signature(s) Signed: 07/07/2021 4:13:21 PM By: Levora Dredge Entered By: Levora Dredge on 07/07/2021 14:54:16 [name] ([id number]) -------------------------------------------------------------------------------- Multi Wound Chart Details Patient Name: [name] Date of Service: 07/07/2021 2:30 PM Medical Record Number: [id number] Patient Account Number: [id number] Date of Birth/Sex: 03-Apr-1955 (65 y.o. M) Treating RN: Levora Dredge Primary Care Shaqueta Casady: Reeves Dam Other Clinician: Referring Mckenzee Beem: Reeves Dam Treating Kery Batzel/Extender: Yaakov Guthrie in Treatment: 12 Vital Signs Height(in): 70 Pulse(bpm): 71 Weight(lbs): 277 Blood Pressure(mmHg): 129/74 Body Mass Index(BMI): 39.7 Temperature(F): 98.5 Respiratory Rate(breaths/min): 18 Photos: [N/A:N/A] Wound Location: Right, Lateral, Circumferential N/A N/A Lower Leg Wounding Event: Gradually Appeared N/A N/A Primary Etiology: Diabetic Wound/Ulcer of the Lower N/A N/A Extremity Comorbid History: Hypertension, Type II Diabetes, N/A N/A Neuropathy Date Acquired: 02/03/2021 N/A N/A Weeks of Treatment: 12 N/A N/A Wound Status: Open N/A N/A Wound Recurrence: No N/A N/A Measurements L x W x D (cm) 12.7x9x0.1 N/A N/A Area (cm) : 89.771 N/A N/A Volume (cm) : 8.977 N/A N/A % Reduction in Area: -25.60% N/A N/A % Reduction in Volume: -25.60% N/A N/A Classification: Grade 2 N/A N/A Exudate Amount: Medium N/A N/A Exudate Type: Serosanguineous N/A N/A Exudate Color: red, brown N/A N/A Foul Odor After Cleansing: Yes N/A N/A Odor Anticipated Due to Product No N/A N/A Use: Granulation Amount: Medium (34-66%) N/A N/A Granulation Quality: Red, Pink, Hyper-granulation N/A N/A Necrotic Amount: Medium (34-66%) N/A N/A Necrotic Tissue: Eschar, Adherent Slough N/A N/A Exposed  Structures: Fat Layer (Subcutaneous Tissue): N/A N/A Yes Fascia: No Tendon: No Muscle: No Joint: No Bone: No Epithelialization: Small (1-33%) N/A N/A Treatment Notes Electronic Signature(s) Signed: 07/07/2021 4:13:21 PM By: Ollen Gross, Virgil Benedict ([id number]) Entered By: Levora Dredge on 07/07/2021 14:54:28 [name] ([id number]) -------------------------------------------------------------------------------- Multi-Disciplinary Care Plan Details Patient Name: [name] Date of Service: 07/07/2021 2:30 PM Medical Record Number: [id number] Patient Account Number: [id number] Date of Birth/Sex: [birth year] (65 y.o. M) Treating RN: Levora Dredge Primary Care Kam Rahimi: Reeves Dam Other Clinician: Referring Berea Majkowski: Reeves Dam Treating Molly Savarino/Extender: Yaakov Guthrie in Treatment: 12 Active Inactive Necrotic Tissue Nursing Diagnoses: Impaired tissue integrity related to necrotic/devitalized  tissue Knowledge deficit related to management of necrotic/devitalized tissue Goals: Necrotic/devitalized tissue will be minimized in the wound bed Date Initiated: 04/15/2021 Target Resolution Date: 04/15/2021 Goal Status: Active Patient/caregiver will verbalize understanding of reason and process for debridement of necrotic tissue Date Initiated: 04/15/2021 Date Inactivated: 05/20/2021 Target Resolution Date: 04/15/2021 Goal Status: Met Interventions: Assess patient pain level pre-, during and post procedure and prior to discharge Provide education on necrotic tissue and debridement process Notes: Wound/Skin Impairment Nursing Diagnoses: Knowledge deficit related to ulceration/compromised skin integrity Goals: Patient/caregiver will verbalize understanding of skin care regimen Date Initiated: 04/08/2021 Date Inactivated: 05/04/2021 Target Resolution Date: 05/08/2021 Goal Status: Met Ulcer/skin breakdown will have a volume reduction of 30% by week  4 Date Initiated: 04/08/2021 Target Resolution Date: 06/08/2021 Goal Status: Active Ulcer/skin breakdown will have a volume reduction of 50% by week 8 Date Initiated: 04/08/2021 Target Resolution Date: 07/08/2021 Goal Status: Active Ulcer/skin breakdown will have a volume reduction of 80% by week 12 Date Initiated: 04/08/2021 Target Resolution Date: 08/08/2021 Goal Status: Active Ulcer/skin breakdown will heal within 14 weeks Date Initiated: 04/08/2021 Target Resolution Date: 09/08/2021 Goal Status: Active Interventions: Assess patient/caregiver ability to obtain necessary supplies Assess patient/caregiver ability to perform ulcer/skin care regimen upon admission and as needed Assess ulceration(s) every visit Notes: Electronic Signature(s) Signed: 07/07/2021 4:13:21 PM By: Levora Dredge Entered By: Levora Dredge on 07/07/2021 14:54:21 [name] ([id number]) Theda Sers, Virgil Benedict ([id number]) -------------------------------------------------------------------------------- Pain Assessment Details Patient Name: [name] Date of Service: 07/07/2021 2:30 PM Medical Record Number: [id number] Patient Account Number: [id number] Date of Birth/Sex: 1955/06/28 (66 y.o. M) Treating RN: Levora Dredge Primary Care Ylonda Storr: Reeves Dam Other Clinician: Referring Harriett Azar: Reeves Dam Treating Nikita Humble/Extender: Yaakov Guthrie in Treatment: 12 Active Problems Location of Pain Severity and Description of Pain Patient Has Paino No Site Locations Rate the pain. Current Pain Level: 0 Pain Management and Medication Current Pain Management: Electronic Signature(s) Signed: 07/07/2021 4:13:21 PM By: Levora Dredge Entered By: Levora Dredge on 07/07/2021 14:44:05 [name] ([id number]) -------------------------------------------------------------------------------- Patient/Caregiver Education Details Patient Name: [name] Date of Service: 07/07/2021 2:30  PM Medical Record Number: [id number] Patient Account Number: [id number] Date of Birth/Gender: [birth year] (65 y.o. M) Treating RN: Levora Dredge Primary Care Physician: Reeves Dam Other Clinician: Referring Physician: Reeves Dam Treating Physician/Extender: Yaakov Guthrie in Treatment: 12 Education Assessment Education Provided To: Patient Education Topics Provided Wound Debridement: Handouts: Wound Debridement Methods: Explain/Verbal Responses: State content correctly Wound/Skin Impairment: Handouts: Caring for Your Ulcer Methods: Explain/Verbal Responses: State content correctly Electronic Signature(s) Signed: 07/07/2021 4:13:21 PM By: Levora Dredge Entered By: Levora Dredge on 07/07/2021 15:20:29 [name] ([id number]) -------------------------------------------------------------------------------- Wound Assessment Details Patient Name: [name] Date of Service: 07/07/2021 2:30 PM Medical Record Number: [id number] Patient Account Number: [id number] Date of Birth/Sex: [birth year] (65 y.o. M) Treating RN: Levora Dredge Primary Care Arlesia Kiel: Reeves Dam Other Clinician: Referring Lundon Rosier: Reeves Dam Treating Dwayne Bulkley/Extender: Yaakov Guthrie in Treatment: 12 Wound Status Wound Number: 3 Primary Etiology: Diabetic Wound/Ulcer of the Lower Extremity Wound Location: Right, Lateral, Circumferential Lower Leg Wound Status: Open Wounding Event: Gradually Appeared Comorbid History: Hypertension, Type II Diabetes, Neuropathy Date Acquired: 02/03/2021 Weeks Of Treatment: 12 Clustered Wound: No Photos Wound Measurements Length: (cm) 12.7 Width: (cm) 9 Depth: (cm) 0.1 Area: (cm) 89.771 Volume: (cm) 8.977 % Reduction in Area: -25.6% % Reduction in Volume: -25.6% Epithelialization: Small (1-33%) Tunneling: No Undermining: No Wound Description Classification: Grade 2 Exudate Amount: Medium Exudate Type:  Serosanguineous Exudate  Color: red, brown Foul Odor After Cleansing: Yes Due to Product Use: No Slough/Fibrino No Wound Bed Granulation Amount: Medium (34-66%) Exposed Structure Granulation Quality: Red, Pink, Hyper-granulation Fascia Exposed: No Necrotic Amount: Medium (34-66%) Fat Layer (Subcutaneous Tissue) Exposed: Yes Necrotic Quality: Eschar, Adherent Slough Tendon Exposed: No Muscle Exposed: No Joint Exposed: No Bone Exposed: No Treatment Notes Wound #3 (Lower Leg) Wound Laterality: Right, Lateral, Circumferential Cleanser Soap and Water Discharge Instruction: Gently cleanse wound with antibacterial soap, rinse and pat dry prior to dressing wounds Wound Cleanser Discharge Instruction: Wash your hands with soap and water. Remove old dressing, discard into plastic bag and place into trash. Cleanse the wound with Wound Cleanser prior to applying a clean dressing using gauze sponges, not tissues or cotton balls. Do not Raker, Kyle V. ([id number]) scrub or use excessive force. Pat dry using gauze sponges, not tissue or cotton balls. Peri-Wound Care Topical Gentamicin Discharge Instruction: Apply thin film topically until [facility] antibiotic recieved Primary Dressing Xeroform 5x9-HBD (in/in) Discharge Instruction: Apply Xeroform 5x9-HBD (in/in) as directed Secondary Dressing ABD Pad 5x9 (in/in) Discharge Instruction: 2 pads to Cover with ABD pad Secured With Compression Wrap 3-LAYER WRAP - Profore Lite LF 3 Multilayer Compression Bandaging System Discharge Instruction: Apply 3 multi-layer wrap as prescribed. Compression Stockings Add-Ons Electronic Signature(s) Signed: 07/07/2021 4:13:21 PM By: Levora Dredge Entered By: Levora Dredge on 07/07/2021 14:53:48 [name] ([id number]) -------------------------------------------------------------------------------- Vitals Details Patient Name: [name] Date of Service: 07/07/2021 2:30 PM Medical  Record Number: [id number] Patient Account Number: [id number] Date of Birth/Sex: January 29, 1955 (65 y.o. M) Treating RN: Levora Dredge Primary Care Srinivas Lippman: Reeves Dam Other Clinician: Referring Kolton Kienle: Reeves Dam Treating Estellar Cadena/Extender: Yaakov Guthrie in Treatment: 12 Vital Signs Time Taken: 14:42 Temperature (F): 98.5 Height (in): 70 Pulse (bpm): 71 Weight (lbs): 277 Respiratory Rate (breaths/min): 18 Body Mass Index (BMI): 39.7 Blood Pressure (mmHg): 129/74 Reference Range: 80 - 120 mg / dl Electronic Signature(s) Signed: 07/07/2021 4:13:21 PM By: Levora Dredge Entered By: Levora Dredge on 07/07/2021 14:43:57

## 2021-07-15 ENCOUNTER — Encounter: Payer: Medicare (Managed Care) | Admitting: Physician Assistant

## 2021-07-15 DIAGNOSIS — E11622 Type 2 diabetes mellitus with other skin ulcer: Secondary | ICD-10-CM | POA: Diagnosis not present

## 2021-07-15 NOTE — Progress Notes (Addendum)
LOXLEY, SCHMALE ([id number]) Visit Report for 07/15/2021 Chief Complaint Document Details Patient Name: Shane Bean, Shane Bean Date of Service: 07/15/2021 2:00 PM Medical Record Number: [id number] Patient Account Number: [id number] Date of Birth/Sex: 12-28-1955 (66 y.o. M) Treating RN: Carlene Coria Primary Care Provider: Reeves Dam Other Clinician: Referring Provider: Reeves Dam Treating Provider/Extender: Skipper Cliche in Treatment: 14 Information Obtained from: Patient Chief Complaint Right LE Ulcer Electronic Signature(s) Signed: 07/15/2021 2:16:46 PM By: Worthy Keeler PA-C Entered By: Worthy Keeler on 07/15/2021 14:16:46 Shane Bean ([id number]) -------------------------------------------------------------------------------- HPI Details Patient Name: Shane Bean Date of Service: 07/15/2021 2:00 PM Medical Record Number: [id number] Patient Account Number: [id number] Date of Birth/Sex: 1955-06-08 (66 y.o. M) Treating RN: Carlene Coria Primary Care Provider: Reeves Dam Other Clinician: Referring Provider: Reeves Dam Treating Provider/Extender: Skipper Cliche in Treatment: 14 History of Present Illness HPI Description: 05/18/2020 upon evaluation today patient appears for initial evaluation here in clinic concerning issues that he has been having with the wound on his left lateral leg. Fortunately there does not appear to be any signs of obvious an active infection at this time which is great news. With that being said the patient unfortunately is continued to have issues with pain although he tells me it is gotten a lot better. He was on Keflex as well as Bactrim DS which seems to have done a good job there. His most recent hemoglobin A1c was 9.8 that was on 04/14/2020. Subsequently I do feel like that the patient is making progress here. He does have a history of chronic venous insufficiency as well as hypertension. I do not see any need for antibiotics  at this point. 5/25; follow-up of the wound on the left posterior calf. This is completely necrotic on the surface. It does not look infected but it is painful. He is a diabetic but I think there is some suggestion here of chronic venous disease as well. We used Iodoflex under compression last week 6/1; again a completely necrotic surface on this with the wound. We have been using Iodoflex under compression he complains of gnawing pain. He has had wounds previously a lot of this looks like chronic venous disease. 6/8; about two thirds of the surface of this wound is still covered in a black necrotic eschar. I changed him from Iodoflex to [place] last week because of complaints of pain. He actually was seen by her neurologist this weeko Peripheral neuropathy as a cause of the pain and they changed him from Neurontin to Lyrica but he still has not had any relief. He says that most of the pain however is in his heel not in his wound per se. He is a diabetic 6/15. This is a patient with what looks to be a venous wound on the left lateral lower leg. He is also a diabetic. Currently being worked up for diabetic neuropathy. He complains of pain out of proportion to the size of the wound although to be fair the entire area here was eschared. We have been working to get a viable surface. We are using Sorbact 6/22; fairly painful wound on the left posterior calf. I am assuming this is been venous he is also a diabetic. His ABI in our clinic was noncompressible however he has easily palpable pulses on his feet. He complains of a lot of pain in the wound also of the left heel and the upper left calf. Some of this may be neuropathy. It is led me to discontinue  Iodoflex reduce his compression but he still complains of pain in fact he says he could not take a debridement today. When I first saw this it was completely necrotic surface we have got it down to something that looks a reasonably healthy I have been  wondering about biopsying this area however he is on Coumadin and with the pain I have put this off. The major question would be an inflammatory ulcer such as pyoderma. The patient is not aware of how this started. He does however have a wound history on both legs. 6/29; patient presents for 1 week follow-up. He has been using Hydrofera Blue under Kerlix/Coban. He has tolerated this well. He currently denies signs of infection. He also declines debridement today. He he states it is painful and does not want to have this done. 7/6; Patient presents for 1 week follow-up. He has been using Hydrofera Blue under Kerlix/Coban. He denies signs of infection. He declines debridement today. 7/15; patient presents for 1 week follow-up. He has been using Hydrofera Blue under Kerlix/Coban. He denies signs of infection today. He is agreeable to having debridement done today. 7/20; patient presents for 1 week follow-up. He is in good spirits today. He has been using Hydrofera Blue under Kerlix/Coban. He denies signs of infection today. 7/27; patient presents for 1 week follow-up. He is in good spirits today. He has no issues or complaints today. He denies signs of infection. 08/12/2020 upon evaluation today patient appears to be doing better in regard to his wound. He has been tolerating the dressing changes without complication. With that being said he did have arterial studies and does show that he has a TBI on the right of 0.90 and a TBI on the left of 0.76 which is excellent. There is no evidence of arterial compromise at this point. 8/17; patient presents for 1 week follow-up. He has no issues or complaints today. He denies signs of infection. 8/24; patient presents for 1 week follow-up. He has no issues or complaints today. He denies signs of infection. He continues to tolerate the compression wrap well. 8/31; patient presents for 1 week follow-up. He has no issues or complaints today. He denies signs of  infection. He is in good spirits today. 9/7; patient presents for 1 week follow-up. He is tolerated the compression wrap well. He has no issues or complaints today. He denies signs of infection. 9/14; patient presents for 1 week follow-up. He has no issues or complaints today. He denies signs of infection. He is in excellent spirits today. 9/21; patient presents for 1 week follow-up. He has no issues or complaints today. 9/28; patient presents for 1 week follow-up. He has tolerated the compression wrap well. He has no issues or complaints today. 10/5; patient presents for 1 week follow-up. He has no issues or complaints today. 10/12; patient presents for 1 week follow-up. He has no issues or complaints today. He denies signs of infection. Shane Bean, Shane Bean ([id number]) Readmission: 04-08-2021 upon evaluation today patient appears to be doing poorly in regard to his right lower extremity. He is being seen for reevaluation here in the clinic he was last discharged healed in October 2022. Subsequently at that time it was noted in the chart that he was told to be wearing compression socks unfortunately the patient tells me he never remembers being told that. I did discuss this with him today as well as Hosie Poisson discussing it with him today again. Obviously he does know at this point once he  is healed he should be wearing compression socks all the time from the time he gets up in the morning to when he goes to sleep at night day in and day out. I think this is the only way that you can to keep things under control. Otherwise his medical history has not changed his arterial study in the past was excellent and did not appear to show any signs of issue at this point which is great news as well. 04-15-2021 upon evaluation today patient's wound is actually looking much drier than what it was last week. Again I do believe the infection is showing signs of improvement although he is having pain for sore I think  this has to do more with the dryness of the wound at this time as opposed to the infection that we were noted in the last week. Fortunately I do not see any evidence of active infection locally or systemically at this time which is great news. 04-22-2021 upon evaluation today patient unfortunately continues to have issues with his leg. This is getting significantly worse despite being on Augmentin. I am actually extremely concerned about worsening infection and especially considering the way the wound looks today and the odor present. 05-04-2021 upon evaluation today patient appears to be doing better with regard to the infection in his leg. He has been tolerating the dressing changes without complication and overall I am extremely pleased with where we stand. There does not appear to be any evidence of active infection locally or systemically which is great news. 05-13-2021 upon evaluation today patient appears to be doing better in regard to his leg ulcer. He has been tolerating the dressing changes without complication. He does have still significant pain he does have a lot of necrotic tissue which is working its way off and there is no signs of significant infection at this point which is great news. With that being said I do believe that the patient does need to likely continue with the wound care measures at this point with regard to the dressing that we are utilizing. 05-20-2021 upon evaluation today patient's wound is actually showing signs of fairly good granulation and epithelization in a lot of areas he still has some slough noted at this point though a lot of the eschar is softening up I do believe the Xeroform has been helpful in this regard. Fortunately I do not see any evidence of active infection locally or systemically which is great news. No fevers, chills, nausea, vomiting, or diarrhea. I do believe the 3 layer compression wrap has been beneficial. 05-27-2021 upon evaluation today  patient's wound is actually showing signs of significant improvement with a lot of new epithelial growth at this point. Fortunately I do not see any signs of active infection locally or systemically which is great news and overall I think we are headed in the right direction. 06-03-2021 upon evaluation today patient appears to be doing well with regard to his wounds although he still has a lot of slough and biofilm buildup noted. We are going to need to try to do some sharp debridement today to clear this away and he is in agreement with allowing me to try what we can. I am not certain how much working to be able to remove or not. 06-10-2021 upon evaluation today patient appears to be doing well with regard to his wound from the overall appearance and size standpoint this is good. From a drainage standpoint and the odor as well this  seems to still be infected in my opinion. I do not think the Augmentin was sufficient. With that being said I discussed this with the patient I do believe that he may benefit from topical [place] antibiotics in the meantime we will get a use gentamicin to bridge the gap until we get those. 06-17-2021 upon evaluation today patient appears to be doing better in regard to his leg. I have not been able to get him the [facility] as it was too expensive for the topical antibiotics. For that reason would been using gentamicin which I do feel like is helping and I do think that is probably can to be our best bet at this point all things considered. He is in agreement with giving this a trial and seeing how things go. Fortunately I see no evidence of active infection systemically locally some of the odor seems indicate there may be some bacteria present but I am not sure what is best to do past what we are already doing. 06-24-2021 upon evaluation today patient appears to be doing well with regard to his healing in fact he is got more epithelial growth than what we have seen previous.  Fortunately I think that each week I see him this is looking better and better. Fortunately I see no signs of active infection at this time locally or systemically which is great news. 07-01-2021 upon evaluation today patient appears to be doing excellent in regard to his leg ulcers. He has been tolerating the dressing changes without complication. Fortunately I see no evidence of active infection locally or systemically at this time which is great news. Overall I am extremely happy with where we stand today. 7/5; patient presents for follow-up. We have been using gentamicin and Xeroform under compression therapy. He has no issues or complaints today. 07-15-2021 upon evaluation today patient appears to be doing well with regard to his leg is almost completely healed. Fortunately I do not see any evidence of active infection locally or systemically which is great news. No fevers, chills, nausea, vomiting, or diarrhea. Electronic Signature(s) Signed: 07/15/2021 2:40:09 PM By: Worthy Keeler PA-C Entered By: Worthy Keeler on 07/15/2021 14:40:09 Shane Bean ([id number]) -------------------------------------------------------------------------------- Physical Exam Details Patient Name: Shane Bean Date of Service: 07/15/2021 2:00 PM Medical Record Number: [id number] Patient Account Number: [id number] Date of Birth/Sex: 03-Dec-1955 (66 y.o. M) Treating RN: Carlene Coria Primary Care Provider: Reeves Dam Other Clinician: Referring Provider: Reeves Dam Treating Provider/Extender: Skipper Cliche in Treatment: 24 Constitutional Well-nourished and well-hydrated in no acute distress. Respiratory normal breathing without difficulty. Psychiatric this patient is able to make decisions and demonstrates good insight into disease process. Alert and Oriented x 3. pleasant and cooperative. Notes Upon inspection patient's wound bed actually showed signs of good granulation and  epithelization at this point. Fortunately I do not see any evidence of active infection locally or systemically which is great news and overall I am extremely pleased with where we stand currently. I do believe that he is very close to complete resolution. Electronic Signature(s) Signed: 07/15/2021 2:40:40 PM By: Worthy Keeler PA-C Previous Signature: 07/15/2021 2:40:30 PM Version By: Worthy Keeler PA-C Entered By: Worthy Keeler on 07/15/2021 14:40:40 Shane Bean ([id number]) -------------------------------------------------------------------------------- Physician Orders Details Patient Name: Shane Bean Date of Service: 07/15/2021 2:00 PM Medical Record Number: [id number] Patient Account Number: [id number] Date of Birth/Sex: 1955-11-04 (66 y.o. M) Treating RN: Carlene Coria Primary Care Provider: Reeves Dam Other Clinician: Referring Provider:  Reeves Dam Treating Provider/Extender: Jeri Cos Weeks in Treatment: 14 Verbal / Phone Orders: No Diagnosis Coding ICD-10 Coding Code Description E11.622 Type 2 diabetes mellitus with other skin ulcer I87.2 Venous insufficiency (chronic) (peripheral) 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 layer exposed L97.812 Non-pressure chronic ulcer of other part of right lower leg with fat layer exposed I10 Essential (primary) hypertension Follow-up Appointments o Return Appointment in 1 week. - 3 layer wrap every week o Nurse Visit as needed Bathing/ Shower/ Hygiene o May shower; gently cleanse wound with antibacterial soap, rinse and pat dry prior to dressing wounds o May shower with wound dressing protected with water repellent cover or cast protector. o No tub bath. Anesthetic (Use 'Patient Medications' Section for Anesthetic Order Entry) o Lidocaine applied to wound bed Edema Control - Lymphedema / Segmental Compressive Device / Other o Elevate, Exercise Daily and Avoid  Standing for Long Periods of Time. o Elevate legs to the level of the heart and pump ankles as often as possible o Elevate leg(s) parallel to the floor when sitting. Additional Orders / Instructions o Follow Nutritious Diet and Increase Protein Intake - monitor blood sugar Wound Treatment Wound #3 - Lower Leg Wound Laterality: Right, Lateral, Circumferential Cleanser: Soap and Water 1 x Per Week/15 Days Discharge Instructions: Gently cleanse wound with antibacterial soap, rinse and pat dry prior to dressing wounds Cleanser: Wound Cleanser 1 x Per Week/15 Days Discharge Instructions: Wash your hands with soap and water. Remove old dressing, discard into plastic bag and place into trash. Cleanse the wound with Wound Cleanser prior to applying a clean dressing using gauze sponges, not tissues or cotton balls. Do not scrub or use excessive force. Pat dry using gauze sponges, not tissue or cotton balls. Topical: Gentamicin 1 x Per Week/15 Days Discharge Instructions: Apply thin film topically until [facility] antibiotic recieved Primary Dressing: Xeroform 5x9-HBD (in/in) 1 x Per Week/15 Days Discharge Instructions: Apply Xeroform 5x9-HBD (in/in) as directed Secondary Dressing: ABD Pad 5x9 (in/in) 1 x Per Week/15 Days Discharge Instructions: 2 pads to Cover with ABD pad Compression Wrap: 3-LAYER WRAP - Profore Lite LF 3 Multilayer Compression Bandaging System 1 x Per Week/15 Days Discharge Instructions: Apply 3 multi-layer wrap as prescribed. Electronic Signature(s) Shane Bean, Shane Bean ([id number]) Signed: 07/15/2021 4:35:29 PM By: Worthy Keeler PA-C Signed: 07/16/2021 10:45:13 AM By: Carlene Coria RN Entered By: Carlene Coria on 07/15/2021 14:36:35 Shane Bean ([id number]) -------------------------------------------------------------------------------- Problem List Details Patient Name: Shane Bean Date of Service: 07/15/2021 2:00 PM Medical Record Number: [id number] Patient  Account Number: [id number] Date of Birth/Sex: 1955-08-11 (66 y.o. M) Treating RN: Carlene Coria Primary Care Provider: Reeves Dam Other Clinician: Referring Provider: Reeves Dam Treating Provider/Extender: Skipper Cliche in Treatment: 14 Active Problems ICD-10 Encounter Code Description Active Date MDM Diagnosis E11.622 Type 2 diabetes mellitus with other skin ulcer 04/08/2021 No Yes I87.2 Venous insufficiency (chronic) (peripheral) 04/08/2021 No Yes 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 layer 04/08/2021 No Yes exposed L97.812 Non-pressure chronic ulcer of other part of right lower leg with fat layer 04/08/2021 No Yes exposed [place] (primary) hypertension 04/08/2021 No Yes Inactive Problems Resolved Problems Electronic Signature(s) Signed: 07/15/2021 2:16:43 PM By: Worthy Keeler PA-C Entered By: Worthy Keeler on 07/15/2021 [place], [place]. ([id number]) -------------------------------------------------------------------------------- Progress Note Details Patient Name: Shane Bean Date of Service: 07/15/2021 2:00 PM Medical Record Number: [id number] Patient Account Number: [id number] Date  of Birth/Sex: [birth year] (66 y.o. M) Treating RN: Carlene Coria Primary Care Provider: Reeves Dam Other Clinician: Referring Provider: Reeves Dam Treating Provider/Extender: Skipper Cliche in Treatment: 14 Subjective Chief Complaint Information obtained from Patient Right LE Ulcer History of Present Illness (HPI) 05/18/2020 upon evaluation today patient appears for initial evaluation here in clinic concerning issues that he has been having with the wound on his left lateral leg. Fortunately there does not appear to be any signs of obvious an active infection at this time which is great news. With that being said the patient unfortunately is continued to have issues with pain although he tells me it is gotten a lot better. He  was on Keflex as well as Bactrim DS which seems to have done a good job there. His most recent hemoglobin A1c was 9.8 that was on 04/14/2020. Subsequently I do feel like that the patient is making progress here. He does have a history of chronic venous insufficiency as well as hypertension. I do not see any need for antibiotics at this point. 5/25; follow-up of the wound on the left posterior calf. This is completely necrotic on the surface. It does not look infected but it is painful. He is a diabetic but I think there is some suggestion here of chronic venous disease as well. We used Iodoflex under compression last week 6/1; again a completely necrotic surface on this with the wound. We have been using Iodoflex under compression he complains of gnawing pain. He has had wounds previously a lot of this looks like chronic venous disease. 6/8; about two thirds of the surface of this wound is still covered in a black necrotic eschar. I changed him from Iodoflex to [place] last week because of complaints of pain. He actually was seen by her neurologist this weeko Peripheral neuropathy as a cause of the pain and they changed him from Neurontin to Lyrica but he still has not had any relief. He says that most of the pain however is in his heel not in his wound per se. He is a diabetic 6/15. This is a patient with what looks to be a venous wound on the left lateral lower leg. He is also a diabetic. Currently being worked up for diabetic neuropathy. He complains of pain out of proportion to the size of the wound although to be fair the entire area here was eschared. We have been working to get a viable surface. We are using Sorbact 6/22; fairly painful wound on the left posterior calf. I am assuming this is been venous he is also a diabetic. His ABI in our clinic was noncompressible however he has easily palpable pulses on his feet. He complains of a lot of pain in the wound also of the left heel and the  upper left calf. Some of this may be neuropathy. It is led me to discontinue Iodoflex reduce his compression but he still complains of pain in fact he says he could not take a debridement today. When I first saw this it was completely necrotic surface we have got it down to something that looks a reasonably healthy I have been wondering about biopsying this area however he is on Coumadin and with the pain I have put this off. The major question would be an inflammatory ulcer such as pyoderma. The patient is not aware of how this started. He does however have a wound history on both legs. 6/29; patient presents for 1 week follow-up. He has been using  Hydrofera Blue under Kerlix/Coban. He has tolerated this well. He currently denies signs of infection. He also declines debridement today. He he states it is painful and does not want to have this done. 7/6; Patient presents for 1 week follow-up. He has been using Hydrofera Blue under Kerlix/Coban. He denies signs of infection. He declines debridement today. 7/15; patient presents for 1 week follow-up. He has been using Hydrofera Blue under Kerlix/Coban. He denies signs of infection today. He is agreeable to having debridement done today. 7/20; patient presents for 1 week follow-up. He is in good spirits today. He has been using Hydrofera Blue under Kerlix/Coban. He denies signs of infection today. 7/27; patient presents for 1 week follow-up. He is in good spirits today. He has no issues or complaints today. He denies signs of infection. 08/12/2020 upon evaluation today patient appears to be doing better in regard to his wound. He has been tolerating the dressing changes without complication. With that being said he did have arterial studies and does show that he has a TBI on the right of 0.90 and a TBI on the left of 0.76 which is excellent. There is no evidence of arterial compromise at this point. 8/17; patient presents for 1 week follow-up. He has no  issues or complaints today. He denies signs of infection. 8/24; patient presents for 1 week follow-up. He has no issues or complaints today. He denies signs of infection. He continues to tolerate the compression wrap well. 8/31; patient presents for 1 week follow-up. He has no issues or complaints today. He denies signs of infection. He is in good spirits today. 9/7; patient presents for 1 week follow-up. He is tolerated the compression wrap well. He has no issues or complaints today. He denies signs of infection. 9/14; patient presents for 1 week follow-up. He has no issues or complaints today. He denies signs of infection. He is in excellent spirits today. 9/21; patient presents for 1 week follow-up. He has no issues or complaints today. Shane Bean, Shane Bean ([id number]) 9/28; patient presents for 1 week follow-up. He has tolerated the compression wrap well. He has no issues or complaints today. 10/5; patient presents for 1 week follow-up. He has no issues or complaints today. 10/12; patient presents for 1 week follow-up. He has no issues or complaints today. He denies signs of infection. Readmission: 04-08-2021 upon evaluation today patient appears to be doing poorly in regard to his right lower extremity. He is being seen for reevaluation here in the clinic he was last discharged healed in October 2022. Subsequently at that time it was noted in the chart that he was told to be wearing compression socks unfortunately the patient tells me he never remembers being told that. I did discuss this with him today as well as Hosie Poisson discussing it with him today again. Obviously he does know at this point once he is healed he should be wearing compression socks all the time from the time he gets up in the morning to when he goes to sleep at night day in and day out. I think this is the only way that you can to keep things under control. Otherwise his medical history has not changed his arterial study in the  past was excellent and did not appear to show any signs of issue at this point which is great news as well. 04-15-2021 upon evaluation today patient's wound is actually looking much drier than what it was last week. Again I do believe the infection is  showing signs of improvement although he is having pain for sore I think this has to do more with the dryness of the wound at this time as opposed to the infection that we were noted in the last week. Fortunately I do not see any evidence of active infection locally or systemically at this time which is great news. 04-22-2021 upon evaluation today patient unfortunately continues to have issues with his leg. This is getting significantly worse despite being on Augmentin. I am actually extremely concerned about worsening infection and especially considering the way the wound looks today and the odor present. 05-04-2021 upon evaluation today patient appears to be doing better with regard to the infection in his leg. He has been tolerating the dressing changes without complication and overall I am extremely pleased with where we stand. There does not appear to be any evidence of active infection locally or systemically which is great news. 05-13-2021 upon evaluation today patient appears to be doing better in regard to his leg ulcer. He has been tolerating the dressing changes without complication. He does have still significant pain he does have a lot of necrotic tissue which is working its way off and there is no signs of significant infection at this point which is great news. With that being said I do believe that the patient does need to likely continue with the wound care measures at this point with regard to the dressing that we are utilizing. 05-20-2021 upon evaluation today patient's wound is actually showing signs of fairly good granulation and epithelization in a lot of areas he still has some slough noted at this point though a lot of the eschar is  softening up I do believe the Xeroform has been helpful in this regard. Fortunately I do not see any evidence of active infection locally or systemically which is great news. No fevers, chills, nausea, vomiting, or diarrhea. I do believe the 3 layer compression wrap has been beneficial. 05-27-2021 upon evaluation today patient's wound is actually showing signs of significant improvement with a lot of new epithelial growth at this point. Fortunately I do not see any signs of active infection locally or systemically which is great news and overall I think we are headed in the right direction. 06-03-2021 upon evaluation today patient appears to be doing well with regard to his wounds although he still has a lot of slough and biofilm buildup noted. We are going to need to try to do some sharp debridement today to clear this away and he is in agreement with allowing me to try what we can. I am not certain how much working to be able to remove or not. 06-10-2021 upon evaluation today patient appears to be doing well with regard to his wound from the overall appearance and size standpoint this is good. From a drainage standpoint and the odor as well this seems to still be infected in my opinion. I do not think the Augmentin was sufficient. With that being said I discussed this with the patient I do believe that he may benefit from topical [facility] antibiotics in the meantime we will get a use gentamicin to bridge the gap until we get those. 06-17-2021 upon evaluation today patient appears to be doing better in regard to his leg. I have not been able to get him the [facility] as it was too expensive for the topical antibiotics. For that reason would been using gentamicin which I do feel like is helping and I do think that  is probably can to be our best bet at this point all things considered. He is in agreement with giving this a trial and seeing how things go. Fortunately I see no evidence of active infection  systemically locally some of the odor seems indicate there may be some bacteria present but I am not sure what is best to do past what we are already doing. 06-24-2021 upon evaluation today patient appears to be doing well with regard to his healing in fact he is got more epithelial growth than what we have seen previous. Fortunately I think that each week I see him this is looking better and better. Fortunately I see no signs of active infection at this time locally or systemically which is great news. 07-01-2021 upon evaluation today patient appears to be doing excellent in regard to his leg ulcers. He has been tolerating the dressing changes without complication. Fortunately I see no evidence of active infection locally or systemically at this time which is great news. Overall I am extremely happy with where we stand today. 7/5; patient presents for follow-up. We have been using gentamicin and Xeroform under compression therapy. He has no issues or complaints today. 07-15-2021 upon evaluation today patient appears to be doing well with regard to his leg is almost completely healed. Fortunately I do not see any evidence of active infection locally or systemically which is great news. No fevers, chills, nausea, vomiting, or diarrhea. Shane Bean, Shane Bean ([id number]) Objective Constitutional Well-nourished and well-hydrated in no acute distress. Vitals Time Taken: 2:22 PM, Height: 70 in, Weight: 277 lbs, BMI: 39.7, Temperature: 97.6 F, Pulse: 75 bpm, Respiratory Rate: 18 breaths/min, Blood Pressure: 153/73 mmHg. Respiratory normal breathing without difficulty. Psychiatric this patient is able to make decisions and demonstrates good insight into disease process. Alert and Oriented x 3. pleasant and cooperative. General Notes: Upon inspection patient's wound bed actually showed signs of good granulation and epithelization at this point. Fortunately I do not see any evidence of active infection  locally or systemically which is great news and overall I am extremely pleased with where we stand currently. I do believe that he is very close to complete resolution. Integumentary (Hair, Skin) Wound #3 status is Open. Original cause of wound was Gradually Appeared. The date acquired was: 02/03/2021. The wound has been in treatment 14 weeks. The wound is located on the Right,Lateral,Circumferential Lower Leg. The wound measures 1.5cm length x 1.5cm width x 0.1cm depth; 1.767cm^2 area and 0.177cm^3 volume. There is Fat Layer (Subcutaneous Tissue) exposed. There is no tunneling or undermining noted. There is a medium amount of serosanguineous drainage noted. There is medium (34-66%) red, pink, hyper - granulation within the wound bed. There is a small (1-33%) amount of necrotic tissue within the wound bed including Adherent Slough. Assessment Active Problems ICD-10 Type 2 diabetes mellitus with other skin ulcer Venous insufficiency (chronic) (peripheral) Non-pressure chronic ulcer of other part of left lower leg with fat layer exposed Non-pressure chronic ulcer of other part of right lower leg with fat layer exposed Essential (primary) hypertension Procedures Wound #3 Pre-procedure diagnosis of Wound #3 is a Diabetic Wound/Ulcer of the Lower Extremity located on the Right,Lateral,Circumferential Lower Leg . There was a Three Layer Compression Therapy Procedure by Carlene Coria, RN. Post procedure Diagnosis Wound #3: Same as Pre-Procedure Plan Follow-up Appointments: Return Appointment in 1 week. - 3 layer wrap every week Nurse Visit as needed Bathing/ Shower/ Hygiene: May shower; gently cleanse wound with antibacterial soap, rinse and pat  dry prior to dressing wounds May shower with wound dressing protected with water repellent cover or cast protector. No tub bath. Anesthetic (Use 'Patient Medications' Section for Anesthetic Order Entry): Lidocaine applied to wound bed Edema Control -  Lymphedema / Segmental Compressive Device / Other: Elevate, Exercise Daily and Avoid Standing for Long Periods of Time. Elevate legs to the level of the heart and pump ankles as often as possible Elevate leg(s) parallel to the floor when sitting. Additional Orders / Instructions: Shane Bean, Shane Bean ([id number]) Follow Nutritious Diet and Increase Protein Intake - monitor blood sugar WOUND #3: - Lower Leg Wound Laterality: Right, Lateral, Circumferential Cleanser: Soap and Water 1 x Per Week/15 Days Discharge Instructions: Gently cleanse wound with antibacterial soap, rinse and pat dry prior to dressing wounds Cleanser: Wound Cleanser 1 x Per Week/15 Days Discharge Instructions: Wash your hands with soap and water. Remove old dressing, discard into plastic bag and place into trash. Cleanse the wound with Wound Cleanser prior to applying a clean dressing using gauze sponges, not tissues or cotton balls. Do not scrub or use excessive force. Pat dry using gauze sponges, not tissue or cotton balls. Topical: Gentamicin 1 x Per Week/15 Days Discharge Instructions: Apply thin film topically until [facility] antibiotic recieved Primary Dressing: Xeroform 5x9-HBD (in/in) 1 x Per Week/15 Days Discharge Instructions: Apply Xeroform 5x9-HBD (in/in) as directed Secondary Dressing: ABD Pad 5x9 (in/in) 1 x Per Week/15 Days Discharge Instructions: 2 pads to Cover with ABD pad Compression Wrap: 3-LAYER WRAP - Profore Lite LF 3 Multilayer Compression Bandaging System 1 x Per Week/15 Days Discharge Instructions: Apply 3 multi-layer wrap as prescribed. 1. I am going to recommend currently that we go ahead and continue with the recommendation for the Xeroform gauze with gentamicin down first which seems to be doing a great job. 2. I am also can recommend that we have the patient continue to monitor for any signs of worsening or infection. Obviously if anything changes he should let me know as soon as possible. We  will see patient back for reevaluation in 1 week here in the clinic. If anything worsens or changes patient will contact our office for additional recommendations. Electronic Signature(s) Signed: 07/15/2021 2:41:09 PM By: Worthy Keeler PA-C Entered By: Worthy Keeler on 07/15/2021 14:41:09 Shane Bean ([id number]) -------------------------------------------------------------------------------- SuperBill Details Patient Name: Shane Bean Date of Service: 07/15/2021 Medical Record Number: [id number] Patient Account Number: [id number] Date of Birth/Sex: [birth year] (66 y.o. M) Treating RN: Carlene Coria Primary Care Provider: Reeves Dam Other Clinician: Referring Provider: Reeves Dam Treating Provider/Extender: Jeri Cos Weeks in Treatment: 14 Diagnosis Coding ICD-10 Codes Code Description E11.622 Type 2 diabetes mellitus with other skin ulcer I87.2 Venous insufficiency (chronic) (peripheral) 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 layer exposed L97.812 Non-pressure chronic ulcer of other part of right lower leg with fat layer exposed [place] (primary) hypertension Facility Procedures CPT4 Code: 32355732 Description: (Facility Use Only) [id number] - APPLY MULTLAY COMPRS LWR RT LEG Modifier: Quantity: 1 Physician Procedures CPT4 Code: 0623762 Description: 83151 - WC PHYS LEVEL 3 - EST PT Modifier: Quantity: 1 CPT4 Code: Description: ICD-10 Diagnosis Description E11.622 Type 2 diabetes mellitus with other skin ulcer I87.2 Venous insufficiency (chronic) (peripheral) 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 lay L97.812 Non-pressure  chronic ulcer of other part of right lower leg with fat la Modifier: er exposed yer exposed Quantity: Electronic Signature(s) Signed: 07/15/2021 2:41:37 PM  By: Worthy Keeler PA-C Entered By: Worthy Keeler on 07/15/2021 14:41:37

## 2021-07-15 NOTE — Progress Notes (Addendum)
BJORN, HALLAS ([id number]) Visit Report for 07/15/2021 Arrival Information Details Patient Name: Shane Bean, Shane Bean Date of Service: 07/15/2021 2:00 PM Medical Record Number: [id number] Patient Account Number: [id number] Date of Birth/Sex: September 12, 1955 (66 y.o. M) Treating RN: Carlene Coria Primary Care Dominiqua Cooner: Reeves Dam Other Clinician: Referring Cherokee Boccio: Reeves Dam Treating Alta Shober/Extender: Skipper Cliche in Treatment: 14 Visit Information History Since Last Visit All ordered tests and consults were completed: No Patient Arrived: Ambulatory Added or deleted any medications: No Arrival Time: 14:19 Any new allergies or adverse reactions: No Accompanied By: self Had a fall or experienced change in No Transfer Assistance: None activities of daily living that may affect Patient Identification Verified: Yes risk of falls: Secondary Verification Process Completed: Yes Signs or symptoms of abuse/neglect since last visito No Patient Requires Transmission-Based No Hospitalized since last visit: No Precautions: Implantable device outside of the clinic excluding No Patient Has Alerts: Yes cellular tissue based products placed in the center Patient Alerts: Patient on Blood since last visit: Thinner Has Dressing in Place as Prescribed: Yes ABI R [place] TBI .90 7/22 Has Compression in Place as Prescribed: Yes ABI L 1.46 TBI .76 7/22 Pain Present Now: No Electronic Signature(s) Signed: 07/16/2021 10:45:13 AM By: Carlene Coria RN Entered By: Carlene Coria on 07/15/2021 14:22:16 Shane Bean ([id number]) -------------------------------------------------------------------------------- Clinic Level of Care Assessment Details Patient Name: Shane Bean Date of Service: 07/15/2021 2:00 PM Medical Record Number: [id number] Patient Account Number: [id number] Date of Birth/Sex: [birth year] (65 y.o. M) Treating RN: Carlene Coria Primary Care Shevaun Lovan: Reeves Dam Other  Clinician: Referring Jerran Tappan: Reeves Dam Treating Jahari Wiginton/Extender: Skipper Cliche in Treatment: 14 Clinic Level of Care Assessment Items TOOL 1 Quantity Score _0  - Use when EandM and Procedure is performed on INITIAL visit 0 ASSESSMENTS - Nursing Assessment / Reassessment _1  - General Physical Exam (combine w/ comprehensive assessment (listed just below) when performed on new 0 pt. evals) _2  - 0 Comprehensive Assessment (HX, ROS, Risk Assessments, Wounds Hx, etc.) ASSESSMENTS - Wound and Skin Assessment / Reassessment _3  - Dermatologic / Skin Assessment (not related to wound area) 0 ASSESSMENTS - Ostomy and/or Continence Assessment and Care _4  - Incontinence Assessment and Management 0 _5  - 0 Ostomy Care Assessment and Management (repouching, etc.) PROCESS - Coordination of Care _6  - Simple Patient / Family Education for ongoing care 0 _7  - 0 Complex (extensive) Patient / Family Education for ongoing care _8  - 0 Staff obtains Programmer, systems, Records, Test Results / Process Orders _9  - 0 Staff telephones HHA, Nursing Homes / Clarify orders / etc _10  - 0 Routine Transfer to another Facility (non-emergent condition) _11  - 0 Routine Hospital Admission (non-emergent condition) _12  - 0 New Admissions / Biomedical engineer / Ordering NPWT, Apligraf, etc. _13  - 0 Emergency Hospital Admission (emergent condition) PROCESS - Special Needs _14  - Pediatric / Minor Patient Management 0 _15  - 0 Isolation Patient Management _16  - 0 Hearing / Language / Visual special needs _17  - 0 Assessment of Community assistance (transportation, D/C planning, etc.) _18  - 0 Additional assistance / Altered mentation _19  - 0 Support Surface(s) Assessment (bed, cushion, seat, etc.) INTERVENTIONS - Miscellaneous _20  - External ear exam 0 _21  - 0 Patient Transfer (multiple staff / Civil Service fast streamer / Similar devices) _22  - 0 Simple Staple / Suture removal (25 or less) _23  - 0 Complex Staple / Suture removal (26  or more) _24  - 0 Hypo/Hyperglycemic Management (do not check if billed separately) _25  - 0 Ankle / Brachial Index (ABI) -  do not check if billed separately Has the patient been seen at the hospital within the last three years: Yes Total Score: 0 Level Of Care: ____ Shane Bean ([id number]) Electronic Signature(s) Signed: 07/16/2021 10:45:13 AM By: Carlene Coria RN Entered By: Carlene Coria on 07/15/2021 14:36:45 Shane Bean ([id number]) -------------------------------------------------------------------------------- Compression Therapy Details Patient Name: Shane Bean Date of Service: 07/15/2021 2:00 PM Medical Record Number: [id number] Patient Account Number: [id number] Date of Birth/Sex: Aug 05, 1955 (65 y.o. M) Treating RN: Carlene Coria Primary Care Traver Meckes: Reeves Dam Other Clinician: Referring Linn Clavin: Reeves Dam Treating Haruko Mersch/Extender: Skipper Cliche in Treatment: 14 Compression Therapy Performed for Wound Assessment: Wound #3 Right,Lateral,Circumferential Lower Leg Performed By: Clinician Carlene Coria, RN Compression Type: Three Layer Post Procedure Diagnosis Same as Pre-procedure Electronic Signature(s) Signed: 07/16/2021 10:45:13 AM By: Carlene Coria RN Entered By: Carlene Coria on 07/15/2021 14:36:11 Shane Bean ([id number]) -------------------------------------------------------------------------------- Encounter Discharge Information Details Patient Name: Shane Bean Date of Service: 07/15/2021 2:00 PM Medical Record Number: [id number] Patient Account Number: [id number] Date of Birth/Sex: [birth year] (65 y.o. M) Treating RN: Carlene Coria Primary Care Trenee Igoe: Reeves Dam Other Clinician: Referring Kellyn Mccary: Reeves Dam Treating Antonis Lor/Extender: Skipper Cliche in Treatment: 14 Encounter Discharge Information Items Discharge Condition: Stable Ambulatory Status: Ambulatory Discharge Destination:  Home Transportation: Private Auto Accompanied By: self Schedule Follow-up Appointment: Yes Clinical Summary of Care: Electronic Signature(s) Signed: 07/16/2021 10:45:13 AM By: Carlene Coria RN Entered By: Carlene Coria on 07/15/2021 14:37:55 Shane Bean ([id number]) -------------------------------------------------------------------------------- Lower Extremity Assessment Details Patient Name: Shane Bean Date of Service: 07/15/2021 2:00 PM Medical Record Number: [id number] Patient Account Number: [id number] Date of Birth/Sex: 1955/01/31 (65 y.o. M) Treating RN: Carlene Coria Primary Care Carla Whilden: Reeves Dam Other Clinician: Referring Armando Bukhari: Reeves Dam Treating Artavious Trebilcock/Extender: Jeri Cos Weeks in Treatment: 14 Edema Assessment Assessed: [Left: Yes] [Right: No] Edema: [Left: Ye] [Right: s] Calf Left: Right: Point of Measurement: 34 cm From Medial Instep 40 cm Ankle Left: Right: Point of Measurement: 11 cm From Medial Instep 25 cm Vascular Assessment Pulses: Dorsalis Pedis Palpable: [Right:Yes] Electronic Signature(s) Signed: 07/16/2021 10:45:13 AM By: Carlene Coria RN Entered By: Carlene Coria on 07/15/2021 14:27:57 Shane Bean ([id number]) -------------------------------------------------------------------------------- Multi Wound Chart Details Patient Name: Shane Bean Date of Service: 07/15/2021 2:00 PM Medical Record Number: [id number] Patient Account Number: [id number] Date of Birth/Sex: 05/16/1955 (65 y.o. M) Treating RN: Carlene Coria Primary Care Latia Mataya: Reeves Dam Other Clinician: Referring Heidi Lemay: Reeves Dam Treating Trentin Knappenberger/Extender: Skipper Cliche in Treatment: 14 Vital Signs Height(in): 70 Pulse(bpm): 75 Weight(lbs): 277 Blood Pressure(mmHg): 153/73 Body Mass Index(BMI): 39.7 Temperature(F): 97.6 Respiratory Rate(breaths/min): 18 Photos: [N/A:N/A] Wound Location: Right, Lateral, Circumferential N/A  N/A Lower Leg Wounding Event: Gradually Appeared N/A N/A Primary Etiology: Diabetic Wound/Ulcer of the Lower N/A N/A Extremity Comorbid History: Hypertension, Type II Diabetes, N/A N/A Neuropathy Date Acquired: 02/03/2021 N/A N/A Weeks of Treatment: 14 N/A N/A Wound Status: Open N/A N/A Wound Recurrence: No N/A N/A Measurements L x W x D (cm) 1.5x1.5x0.1 N/A N/A Area (cm) : 1.767 N/A N/A Volume (cm) : 0.177 N/A N/A % Reduction in Area: 97.50% N/A N/A % Reduction in Volume: 97.50% N/A N/A Classification: Grade 2 N/A N/A Exudate Amount: Medium N/A N/A Exudate Type: Serosanguineous N/A N/A Exudate Color: red, brown N/A N/A Granulation Amount: Medium (34-66%) N/A N/A Granulation Quality: Red, Pink, Hyper-granulation N/A N/A Necrotic Amount: Small (1-33%) N/A N/A Exposed Structures: Fat Layer (Subcutaneous Tissue): N/A N/A Yes Fascia: No Tendon: No Muscle: No Joint:  No Bone: No Epithelialization: Small (1-33%) N/A N/A Treatment Notes Electronic Signature(s) Signed: 07/16/2021 10:45:13 AM By: Carlene Coria RN Entered By: Carlene Coria on 07/15/2021 14:28:07 Shane Bean ([id number]) -------------------------------------------------------------------------------- [place] Details Patient Name: Shane Bean Date of Service: 07/15/2021 2:00 PM Medical Record Number: [id number] Patient Account Number: [id number] Date of Birth/Sex: 04-Jan-1956 (65 y.o. M) Treating RN: Carlene Coria Primary Care Deronda Christian: Reeves Dam Other Clinician: Referring Meridee Branum: Reeves Dam Treating Ahria Slappey/Extender: Skipper Cliche in Treatment: 14 Active Inactive Necrotic Tissue Nursing Diagnoses: Impaired tissue integrity related to necrotic/devitalized tissue Knowledge deficit related to management of necrotic/devitalized tissue Goals: Necrotic/devitalized tissue will be minimized in the wound bed Date Initiated: 04/15/2021 Target Resolution Date: 04/15/2021 Goal  Status: Active Patient/caregiver will verbalize understanding of reason and process for debridement of necrotic tissue Date Initiated: 04/15/2021 Date Inactivated: 05/20/2021 Target Resolution Date: 04/15/2021 Goal Status: Met Interventions: Assess patient pain level pre-, during and post procedure and prior to discharge Provide education on necrotic tissue and debridement process Notes: Wound/Skin Impairment Nursing Diagnoses: Knowledge deficit related to ulceration/compromised skin integrity Goals: Patient/caregiver will verbalize understanding of skin care regimen Date Initiated: 04/08/2021 Date Inactivated: 05/04/2021 Target Resolution Date: 05/08/2021 Goal Status: Met Ulcer/skin breakdown will have a volume reduction of 30% by week 4 Date Initiated: 04/08/2021 Target Resolution Date: 06/08/2021 Goal Status: Active Ulcer/skin breakdown will have a volume reduction of 50% by week 8 Date Initiated: 04/08/2021 Target Resolution Date: 07/08/2021 Goal Status: Active Ulcer/skin breakdown will have a volume reduction of 80% by week 12 Date Initiated: 04/08/2021 Target Resolution Date: 08/08/2021 Goal Status: Active Ulcer/skin breakdown will heal within 14 weeks Date Initiated: 04/08/2021 Target Resolution Date: 09/08/2021 Goal Status: Active Interventions: Assess patient/caregiver ability to obtain necessary supplies Assess patient/caregiver ability to perform ulcer/skin care regimen upon admission and as needed Assess ulceration(s) every visit Notes: Electronic Signature(s) Signed: 07/16/2021 10:45:13 AM By: Carlene Coria RN Entered By: Carlene Coria on 07/15/2021 14:28:01 Shane Bean ([id number] Sers, Virgil Benedict ([id number]) -------------------------------------------------------------------------------- Pain Assessment Details Patient Name: Shane Bean Date of Service: 07/15/2021 2:00 PM Medical Record Number: [id number] Patient Account Number: [id number] Date of Birth/Sex:  [birth year] (65 y.o. M) Treating RN: Carlene Coria Primary Care Amadeus Oyama: Reeves Dam Other Clinician: Referring Izzy Doubek: Reeves Dam Treating Channah Godeaux/Extender: Skipper Cliche in Treatment: 14 Active Problems Location of Pain Severity and Description of Pain Patient Has Paino No Site Locations Pain Management and Medication Current Pain Management: Electronic Signature(s) Signed: 07/16/2021 10:45:13 AM By: Carlene Coria RN Entered By: Carlene Coria on 07/15/2021 14:22:43 Shane Bean ([id number]) -------------------------------------------------------------------------------- Patient/Caregiver Education Details Patient Name: Shane Bean Date of Service: 07/15/2021 2:00 PM Medical Record Number: [id number] Patient Account Number: [id number] Date of Birth/Gender: [birth year] (65 y.o. M) Treating RN: Carlene Coria Primary Care Physician: Reeves Dam Other Clinician: Referring Physician: Reeves Dam Treating Physician/Extender: Skipper Cliche in Treatment: 14 Education Assessment Education Provided To: Patient Education Topics Provided Wound Debridement: Methods: Explain/Verbal Responses: State content correctly Electronic Signature(s) Signed: 07/16/2021 10:45:13 AM By: Carlene Coria RN Entered By: Carlene Coria on 07/15/2021 14:37:16 Shane Bean ([id number]) -------------------------------------------------------------------------------- Wound Assessment Details Patient Name: Shane Bean Date of Service: 07/15/2021 2:00 PM Medical Record Number: [id number] Patient Account Number: [id number] Date of Birth/Sex: [birth year] (65 y.o. M) Treating RN: Carlene Coria Primary Care Shadell Brenn: Reeves Dam Other Clinician: Referring Claryce Friel: Reeves Dam Treating Kiyomi Pallo/Extender: Jeri Cos Weeks in Treatment: 14 Wound Status Wound Number: 3 Primary Etiology: Diabetic Wound/Ulcer of the Lower Extremity Wound Location:  Right, Lateral,  Circumferential Lower Leg Wound Status: Open Wounding Event: Gradually Appeared Comorbid History: Hypertension, Type II Diabetes, Neuropathy Date Acquired: 02/03/2021 Weeks Of Treatment: 14 Clustered Wound: No Photos Wound Measurements Length: (cm) 1.5 Width: (cm) 1.5 Depth: (cm) 0.1 Area: (cm) 1.767 Volume: (cm) 0.177 % Reduction in Area: 97.5% % Reduction in Volume: 97.5% Epithelialization: Small (1-33%) Tunneling: No Undermining: No Wound Description Classification: Grade 2 Exudate Amount: Medium Exudate Type: Serosanguineous Exudate Color: red, brown Foul Odor After Cleansing: No Slough/Fibrino Yes Wound Bed Granulation Amount: Medium (34-66%) Exposed Structure Granulation Quality: Red, Pink, Hyper-granulation Fascia Exposed: No Necrotic Amount: Small (1-33%) Fat Layer (Subcutaneous Tissue) Exposed: Yes Necrotic Quality: Adherent Slough Tendon Exposed: No Muscle Exposed: No Joint Exposed: No Bone Exposed: No Treatment Notes Wound #3 (Lower Leg) Wound Laterality: Right, Lateral, Circumferential Cleanser Soap and Water Discharge Instruction: Gently cleanse wound with antibacterial soap, rinse and pat dry prior to dressing wounds Wound Cleanser Discharge Instruction: Wash your hands with soap and water. Remove old dressing, discard into plastic bag and place into trash. Cleanse the wound with Wound Cleanser prior to applying a clean dressing using gauze sponges, not tissues or cotton balls. Do not Bovard, Sedric V. ([id number]) scrub or use excessive force. Pat dry using gauze sponges, not tissue or cotton balls. Peri-Wound Care Topical Gentamicin Discharge Instruction: Apply thin film topically until [facility] antibiotic recieved Primary Dressing Xeroform 5x9-HBD (in/in) Discharge Instruction: Apply Xeroform 5x9-HBD (in/in) as directed Secondary Dressing ABD Pad 5x9 (in/in) Discharge Instruction: 2 pads to Cover with ABD pad Secured With Compression  Wrap 3-LAYER WRAP - Profore Lite LF 3 Multilayer Compression Bandaging System Discharge Instruction: Apply 3 multi-layer wrap as prescribed. Compression Stockings Add-Ons Electronic Signature(s) Signed: 07/16/2021 10:45:13 AM By: Carlene Coria RN Entered By: Carlene Coria on 07/15/2021 14:27:29 Shane Bean ([id number]) -------------------------------------------------------------------------------- Vitals Details Patient Name: Shane Bean Date of Service: 07/15/2021 2:00 PM Medical Record Number: [id number] Patient Account Number: [id number] Date of Birth/Sex: 1955-06-02 (65 y.o. M) Treating RN: Carlene Coria Primary Care Lealer Marsland: Reeves Dam Other Clinician: Referring Stefon Ramthun: Reeves Dam Treating Niquan Charnley/Extender: Skipper Cliche in Treatment: 14 Vital Signs Time Taken: 14:22 Temperature (F): 97.6 Height (in): 70 Pulse (bpm): 75 Weight (lbs): 277 Respiratory Rate (breaths/min): 18 Body Mass Index (BMI): 39.7 Blood Pressure (mmHg): 153/73 Reference Range: 80 - 120 mg / dl Electronic Signature(s) Signed: 07/16/2021 10:45:13 AM By: Carlene Coria RN Entered By: Carlene Coria on 07/15/2021 14:22:36

## 2021-07-22 ENCOUNTER — Encounter: Payer: Medicare (Managed Care) | Admitting: Physician Assistant

## 2021-07-22 DIAGNOSIS — E11622 Type 2 diabetes mellitus with other skin ulcer: Secondary | ICD-10-CM | POA: Diagnosis not present

## 2021-07-22 NOTE — Progress Notes (Addendum)
KOBEN, DAMAN ([id number]) Visit Report for 07/22/2021 Chief Complaint Document Details Patient Name: Shane Bean, Shane Bean Date of Service: 07/22/2021 2:30 PM Medical Record Number: [id number] Patient Account Number: [id number] Date of Birth/Sex: 03-Aug-1955 (66 y.o. M) Treating RN: Levora Dredge Primary Care Provider: Reeves Dam Other Clinician: Referring Provider: Reeves Dam Treating Provider/Extender: Skipper Cliche in Treatment: 15 Information Obtained from: Patient Chief Complaint Right LE Ulcer Electronic Signature(s) Signed: 07/22/2021 2:54:02 PM By: Worthy Keeler PA-C Entered By: Worthy Keeler on 07/22/2021 14:54:02 Shane Bean ([id number]) -------------------------------------------------------------------------------- HPI Details Patient Name: Shane Bean Date of Service: 07/22/2021 2:30 PM Medical Record Number: [id number] Patient Account Number: [id number] Date of Birth/Sex: [birth year] (66 y.o. M) Treating RN: Levora Dredge Primary Care Provider: Reeves Dam Other Clinician: Referring Provider: Reeves Dam Treating Provider/Extender: Skipper Cliche in Treatment: 15 History of Present Illness HPI Description: 05/18/2020 upon evaluation today patient appears for initial evaluation here in clinic concerning issues that he has been having with the wound on his left lateral leg. Fortunately there does not appear to be any signs of obvious an active infection at this time which is great news. With that being said the patient unfortunately is continued to have issues with pain although he tells me it is gotten a lot better. He was on Keflex as well as Bactrim DS which seems to have done a good job there. His most recent hemoglobin A1c was 9.8 that was on 04/14/2020. Subsequently I do feel like that the patient is making progress here. He does have a history of chronic venous insufficiency as well as hypertension. I do not see any need for  antibiotics at this point. 5/25; follow-up of the wound on the left posterior calf. This is completely necrotic on the surface. It does not look infected but it is painful. He is a diabetic but I think there is some suggestion here of chronic venous disease as well. We used Iodoflex under compression last week 6/1; again a completely necrotic surface on this with the wound. We have been using Iodoflex under compression he complains of gnawing pain. He has had wounds previously a lot of this looks like chronic venous disease. 6/8; about two thirds of the surface of this wound is still covered in a black necrotic eschar. I changed him from Iodoflex to [place] last week because of complaints of pain. He actually was seen by her neurologist this weeko Peripheral neuropathy as a cause of the pain and they changed him from Neurontin to Lyrica but he still has not had any relief. He says that most of the pain however is in his heel not in his wound per se. He is a diabetic 6/15. This is a patient with what looks to be a venous wound on the left lateral lower leg. He is also a diabetic. Currently being worked up for diabetic neuropathy. He complains of pain out of proportion to the size of the wound although to be fair the entire area here was eschared. We have been working to get a viable surface. We are using Sorbact 6/22; fairly painful wound on the left posterior calf. I am assuming this is been venous he is also a diabetic. His ABI in our clinic was noncompressible however he has easily palpable pulses on his feet. He complains of a lot of pain in the wound also of the left heel and the upper left calf. Some of this may be neuropathy. It is led me to discontinue  Iodoflex reduce his compression but he still complains of pain in fact he says he could not take a debridement today. When I first saw this it was completely necrotic surface we have got it down to something that looks a reasonably healthy I  have been wondering about biopsying this area however he is on Coumadin and with the pain I have put this off. The major question would be an inflammatory ulcer such as pyoderma. The patient is not aware of how this started. He does however have a wound history on both legs. 6/29; patient presents for 1 week follow-up. He has been using Hydrofera Blue under Kerlix/Coban. He has tolerated this well. He currently denies signs of infection. He also declines debridement today. He he states it is painful and does not want to have this done. 7/6; Patient presents for 1 week follow-up. He has been using Hydrofera Blue under Kerlix/Coban. He denies signs of infection. He declines debridement today. 7/15; patient presents for 1 week follow-up. He has been using Hydrofera Blue under Kerlix/Coban. He denies signs of infection today. He is agreeable to having debridement done today. 7/20; patient presents for 1 week follow-up. He is in good spirits today. He has been using Hydrofera Blue under Kerlix/Coban. He denies signs of infection today. 7/27; patient presents for 1 week follow-up. He is in good spirits today. He has no issues or complaints today. He denies signs of infection. 08/12/2020 upon evaluation today patient appears to be doing better in regard to his wound. He has been tolerating the dressing changes without complication. With that being said he did have arterial studies and does show that he has a TBI on the right of 0.90 and a TBI on the left of 0.76 which is excellent. There is no evidence of arterial compromise at this point. 8/17; patient presents for 1 week follow-up. He has no issues or complaints today. He denies signs of infection. 8/24; patient presents for 1 week follow-up. He has no issues or complaints today. He denies signs of infection. He continues to tolerate the compression wrap well. 8/31; patient presents for 1 week follow-up. He has no issues or complaints today. He denies  signs of infection. He is in good spirits today. 9/7; patient presents for 1 week follow-up. He is tolerated the compression wrap well. He has no issues or complaints today. He denies signs of infection. 9/14; patient presents for 1 week follow-up. He has no issues or complaints today. He denies signs of infection. He is in excellent spirits today. 9/21; patient presents for 1 week follow-up. He has no issues or complaints today. 9/28; patient presents for 1 week follow-up. He has tolerated the compression wrap well. He has no issues or complaints today. 10/5; patient presents for 1 week follow-up. He has no issues or complaints today. 10/12; patient presents for 1 week follow-up. He has no issues or complaints today. He denies signs of infection. Shane Bean, Shane Bean ([id number]) Readmission: 04-08-2021 upon evaluation today patient appears to be doing poorly in regard to his right lower extremity. He is being seen for reevaluation here in the clinic he was last discharged healed in October 2022. Subsequently at that time it was noted in the chart that he was told to be wearing compression socks unfortunately the patient tells me he never remembers being told that. I did discuss this with him today as well as Hosie Poisson discussing it with him today again. Obviously he does know at this point once he  is healed he should be wearing compression socks all the time from the time he gets up in the morning to when he goes to sleep at night day in and day out. I think this is the only way that you can to keep things under control. Otherwise his medical history has not changed his arterial study in the past was excellent and did not appear to show any signs of issue at this point which is great news as well. 04-15-2021 upon evaluation today patient's wound is actually looking much drier than what it was last week. Again I do believe the infection is showing signs of improvement although he is having pain for sore I  think this has to do more with the dryness of the wound at this time as opposed to the infection that we were noted in the last week. Fortunately I do not see any evidence of active infection locally or systemically at this time which is great news. 04-22-2021 upon evaluation today patient unfortunately continues to have issues with his leg. This is getting significantly worse despite being on Augmentin. I am actually extremely concerned about worsening infection and especially considering the way the wound looks today and the odor present. 05-04-2021 upon evaluation today patient appears to be doing better with regard to the infection in his leg. He has been tolerating the dressing changes without complication and overall I am extremely pleased with where we stand. There does not appear to be any evidence of active infection locally or systemically which is great news. 05-13-2021 upon evaluation today patient appears to be doing better in regard to his leg ulcer. He has been tolerating the dressing changes without complication. He does have still significant pain he does have a lot of necrotic tissue which is working its way off and there is no signs of significant infection at this point which is great news. With that being said I do believe that the patient does need to likely continue with the wound care measures at this point with regard to the dressing that we are utilizing. 05-20-2021 upon evaluation today patient's wound is actually showing signs of fairly good granulation and epithelization in a lot of areas he still has some slough noted at this point though a lot of the eschar is softening up I do believe the Xeroform has been helpful in this regard. Fortunately I do not see any evidence of active infection locally or systemically which is great news. No fevers, chills, nausea, vomiting, or diarrhea. I do believe the 3 layer compression wrap has been beneficial. 05-27-2021 upon evaluation today  patient's wound is actually showing signs of significant improvement with a lot of new epithelial growth at this point. Fortunately I do not see any signs of active infection locally or systemically which is great news and overall I think we are headed in the right direction. 06-03-2021 upon evaluation today patient appears to be doing well with regard to his wounds although he still has a lot of slough and biofilm buildup noted. We are going to need to try to do some sharp debridement today to clear this away and he is in agreement with allowing me to try what we can. I am not certain how much working to be able to remove or not. 06-10-2021 upon evaluation today patient appears to be doing well with regard to his wound from the overall appearance and size standpoint this is good. From a drainage standpoint and the odor as well this  seems to still be infected in my opinion. I do not think the Augmentin was sufficient. With that being said I discussed this with the patient I do believe that he may benefit from topical [place] antibiotics in the meantime we will get a use gentamicin to bridge the gap until we get those. 06-17-2021 upon evaluation today patient appears to be doing better in regard to his leg. I have not been able to get him the [facility] as it was too expensive for the topical antibiotics. For that reason would been using gentamicin which I do feel like is helping and I do think that is probably can to be our best bet at this point all things considered. He is in agreement with giving this a trial and seeing how things go. Fortunately I see no evidence of active infection systemically locally some of the odor seems indicate there may be some bacteria present but I am not sure what is best to do past what we are already doing. 06-24-2021 upon evaluation today patient appears to be doing well with regard to his healing in fact he is got more epithelial growth than what we have seen previous.  Fortunately I think that each week I see him this is looking better and better. Fortunately I see no signs of active infection at this time locally or systemically which is great news. 07-01-2021 upon evaluation today patient appears to be doing excellent in regard to his leg ulcers. He has been tolerating the dressing changes without complication. Fortunately I see no evidence of active infection locally or systemically at this time which is great news. Overall I am extremely happy with where we stand today. 7/5; patient presents for follow-up. We have been using gentamicin and Xeroform under compression therapy. He has no issues or complaints today. 07-15-2021 upon evaluation today patient appears to be doing well with regard to his leg is almost completely healed. Fortunately I do not see any evidence of active infection locally or systemically which is great news. No fevers, chills, nausea, vomiting, or diarrhea. 07-22-2021 upon evaluation patient appears to be almost completely healed. I thought we were going to be there today but we are not quite so. Fortunately I do not see any evidence of active infection locally or systemically which is great news and overall I am extremely pleased with where we stand currently. Electronic Signature(s) Signed: 07/22/2021 4:44:49 PM By: Worthy Keeler PA-C Entered By: Worthy Keeler on 07/22/2021 16:44:49 Shane Bean ([id number]) -------------------------------------------------------------------------------- Physical Exam Details Patient Name: Shane Bean Date of Service: 07/22/2021 2:30 PM Medical Record Number: [id number] Patient Account Number: [id number] Date of Birth/Sex: 1955/01/31 (66 y.o. M) Treating RN: Levora Dredge Primary Care Provider: Reeves Dam Other Clinician: Referring Provider: Reeves Dam Treating Provider/Extender: Jeri Cos Weeks in Treatment: 21 Constitutional Well-nourished and well-hydrated in no acute  distress. Respiratory normal breathing without difficulty. Psychiatric this patient is able to make decisions and demonstrates good insight into disease process. Alert and Oriented x 3. pleasant and cooperative. Notes Upon inspection patient's wound bed actually showed signs of good granulation and epithelization in fact he has very little remaining open at this point I think were very close to complete resolution. Electronic Signature(s) Signed: 07/22/2021 4:45:01 PM By: Worthy Keeler PA-C Entered By: Worthy Keeler on 07/22/2021 16:45:01 Shane Bean ([id number]) -------------------------------------------------------------------------------- Physician Orders Details Patient Name: Shane Bean Date of Service: 07/22/2021 2:30 PM Medical Record Number: [id number] Patient Account Number: [id number] Date  of Birth/Sex: 12/25/1955 (66 y.o. M) Treating RN: Levora Dredge Primary Care Provider: Reeves Dam Other Clinician: Referring Provider: Reeves Dam Treating Provider/Extender: Skipper Cliche in Treatment: 15 Verbal / Phone Orders: No Diagnosis Coding ICD-10 Coding Code Description E11.622 Type 2 diabetes mellitus with other skin ulcer I87.2 Venous insufficiency (chronic) (peripheral) 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 layer exposed L97.812 Non-pressure chronic ulcer of other part of right lower leg with fat layer exposed I10 Essential (primary) hypertension Follow-up Appointments o Return Appointment in 1 week. - 3 layer wrap every week o Nurse Visit as needed Bathing/ Shower/ Hygiene o May shower; gently cleanse wound with antibacterial soap, rinse and pat dry prior to dressing wounds o May shower with wound dressing protected with water repellent cover or cast protector. o No tub bath. Anesthetic (Use 'Patient Medications' Section for Anesthetic Order Entry) o Lidocaine applied to wound bed Edema  Control - Lymphedema / Segmental Compressive Device / Other o Elevate, Exercise Daily and Avoid Standing for Long Periods of Time. o Elevate legs to the level of the heart and pump ankles as often as possible o Elevate leg(s) parallel to the floor when sitting. Additional Orders / Instructions o Follow Nutritious Diet and Increase Protein Intake - monitor blood sugar Wound Treatment Wound #3 - Lower Leg Wound Laterality: Right, Lateral, Circumferential Cleanser: Soap and Water 1 x Per Week/15 Days Discharge Instructions: Gently cleanse wound with antibacterial soap, rinse and pat dry prior to dressing wounds Cleanser: Wound Cleanser 1 x Per Week/15 Days Discharge Instructions: Wash your hands with soap and water. Remove old dressing, discard into plastic bag and place into trash. Cleanse the wound with Wound Cleanser prior to applying a clean dressing using gauze sponges, not tissues or cotton balls. Do not scrub or use excessive force. Pat dry using gauze sponges, not tissue or cotton balls. Primary Dressing: Silvercel 4 1/4x 4 1/4 (in/in) 1 x Per Week/15 Days Discharge Instructions: Apply Silvercel 4 1/4x 4 1/4 (in/in) as instructed Secondary Dressing: ABD Pad 5x9 (in/in) 1 x Per Week/15 Days Discharge Instructions: 2 pads to Cover with ABD pad Compression Wrap: 3-LAYER WRAP - Profore Lite LF 3 Multilayer Compression Bandaging System 1 x Per Week/15 Days Discharge Instructions: Apply 3 multi-layer wrap as prescribed. Electronic Signature(s) Signed: 07/22/2021 4:27:06 PM By: Levora Dredge Signed: 07/22/2021 5:07:46 PM By: Worthy Keeler PA-C Entered By: Levora Dredge on 07/22/2021 15:06:20 YECHIEL, ERNY ([id number], CAUGHLIN ([id number]) -------------------------------------------------------------------------------- Problem List Details Patient Name: Shane Bean Date of Service: 07/22/2021 2:30 PM Medical Record Number: [id number] Patient Account  Number: [id number] Date of Birth/Sex: April 27, 1955 (66 y.o. M) Treating RN: Levora Dredge Primary Care Provider: Reeves Dam Other Clinician: Referring Provider: Reeves Dam Treating Provider/Extender: Jeri Cos Weeks in Treatment: 15 Active Problems ICD-10 Encounter Code Description Active Date MDM Diagnosis E11.622 Type 2 diabetes mellitus with other skin ulcer 04/08/2021 No Yes I87.2 Venous insufficiency (chronic) (peripheral) 04/08/2021 No Yes 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 layer 04/08/2021 No Yes exposed L97.812 Non-pressure chronic ulcer of other part of right lower leg with fat layer 04/08/2021 No Yes exposed [place] (primary) hypertension 04/08/2021 No Yes Inactive Problems Resolved Problems Electronic Signature(s) Signed: 07/22/2021 2:53:56 PM By: Worthy Keeler PA-C Entered By: Worthy Keeler on 07/22/2021 14:53:56 Shane Bean ([id number]) -------------------------------------------------------------------------------- Progress Note Details Patient Name: Shane Bean Date of Service: 07/22/2021 2:30 PM Medical Record  Number: [id number] Patient Account Number: [id number] Date of Birth/Sex: [birth year] (66 y.o. M) Treating RN: Levora Dredge Primary Care Provider: Reeves Dam Other Clinician: Referring Provider: Reeves Dam Treating Provider/Extender: Skipper Cliche in Treatment: 15 Subjective Chief Complaint Information obtained from Patient Right LE Ulcer History of Present Illness (HPI) 05/18/2020 upon evaluation today patient appears for initial evaluation here in clinic concerning issues that he has been having with the wound on his left lateral leg. Fortunately there does not appear to be any signs of obvious an active infection at this time which is great news. With that being said the patient unfortunately is continued to have issues with pain although he tells me it is gotten a lot better. He  was on Keflex as well as Bactrim DS which seems to have done a good job there. His most recent hemoglobin A1c was 9.8 that was on 04/14/2020. Subsequently I do feel like that the patient is making progress here. He does have a history of chronic venous insufficiency as well as hypertension. I do not see any need for antibiotics at this point. 5/25; follow-up of the wound on the left posterior calf. This is completely necrotic on the surface. It does not look infected but it is painful. He is a diabetic but I think there is some suggestion here of chronic venous disease as well. We used Iodoflex under compression last week 6/1; again a completely necrotic surface on this with the wound. We have been using Iodoflex under compression he complains of gnawing pain. He has had wounds previously a lot of this looks like chronic venous disease. 6/8; about two thirds of the surface of this wound is still covered in a black necrotic eschar. I changed him from Iodoflex to [place] last week because of complaints of pain. He actually was seen by her neurologist this weeko Peripheral neuropathy as a cause of the pain and they changed him from Neurontin to Lyrica but he still has not had any relief. He says that most of the pain however is in his heel not in his wound per se. He is a diabetic 6/15. This is a patient with what looks to be a venous wound on the left lateral lower leg. He is also a diabetic. Currently being worked up for diabetic neuropathy. He complains of pain out of proportion to the size of the wound although to be fair the entire area here was eschared. We have been working to get a viable surface. We are using Sorbact 6/22; fairly painful wound on the left posterior calf. I am assuming this is been venous he is also a diabetic. His ABI in our clinic was noncompressible however he has easily palpable pulses on his feet. He complains of a lot of pain in the wound also of the left heel and the  upper left calf. Some of this may be neuropathy. It is led me to discontinue Iodoflex reduce his compression but he still complains of pain in fact he says he could not take a debridement today. When I first saw this it was completely necrotic surface we have got it down to something that looks a reasonably healthy I have been wondering about biopsying this area however he is on Coumadin and with the pain I have put this off. The major question would be an inflammatory ulcer such as pyoderma. The patient is not aware of how this started. He does however have a wound history on both legs. 6/29; patient presents for  1 week follow-up. He has been using Hydrofera Blue under Kerlix/Coban. He has tolerated this well. He currently denies signs of infection. He also declines debridement today. He he states it is painful and does not want to have this done. 7/6; Patient presents for 1 week follow-up. He has been using Hydrofera Blue under Kerlix/Coban. He denies signs of infection. He declines debridement today. 7/15; patient presents for 1 week follow-up. He has been using Hydrofera Blue under Kerlix/Coban. He denies signs of infection today. He is agreeable to having debridement done today. 7/20; patient presents for 1 week follow-up. He is in good spirits today. He has been using Hydrofera Blue under Kerlix/Coban. He denies signs of infection today. 7/27; patient presents for 1 week follow-up. He is in good spirits today. He has no issues or complaints today. He denies signs of infection. 08/12/2020 upon evaluation today patient appears to be doing better in regard to his wound. He has been tolerating the dressing changes without complication. With that being said he did have arterial studies and does show that he has a TBI on the right of 0.90 and a TBI on the left of 0.76 which is excellent. There is no evidence of arterial compromise at this point. 8/17; patient presents for 1 week follow-up. He has no  issues or complaints today. He denies signs of infection. 8/24; patient presents for 1 week follow-up. He has no issues or complaints today. He denies signs of infection. He continues to tolerate the compression wrap well. 8/31; patient presents for 1 week follow-up. He has no issues or complaints today. He denies signs of infection. He is in good spirits today. 9/7; patient presents for 1 week follow-up. He is tolerated the compression wrap well. He has no issues or complaints today. He denies signs of infection. 9/14; patient presents for 1 week follow-up. He has no issues or complaints today. He denies signs of infection. He is in excellent spirits today. 9/21; patient presents for 1 week follow-up. He has no issues or complaints today. Shane Bean, Shane Bean ([id number]) 9/28; patient presents for 1 week follow-up. He has tolerated the compression wrap well. He has no issues or complaints today. 10/5; patient presents for 1 week follow-up. He has no issues or complaints today. 10/12; patient presents for 1 week follow-up. He has no issues or complaints today. He denies signs of infection. Readmission: 04-08-2021 upon evaluation today patient appears to be doing poorly in regard to his right lower extremity. He is being seen for reevaluation here in the clinic he was last discharged healed in October 2022. Subsequently at that time it was noted in the chart that he was told to be wearing compression socks unfortunately the patient tells me he never remembers being told that. I did discuss this with him today as well as Hosie Poisson discussing it with him today again. Obviously he does know at this point once he is healed he should be wearing compression socks all the time from the time he gets up in the morning to when he goes to sleep at night day in and day out. I think this is the only way that you can to keep things under control. Otherwise his medical history has not changed his arterial study in the  past was excellent and did not appear to show any signs of issue at this point which is great news as well. 04-15-2021 upon evaluation today patient's wound is actually looking much drier than what it was last week.  Again I do believe the infection is showing signs of improvement although he is having pain for sore I think this has to do more with the dryness of the wound at this time as opposed to the infection that we were noted in the last week. Fortunately I do not see any evidence of active infection locally or systemically at this time which is great news. 04-22-2021 upon evaluation today patient unfortunately continues to have issues with his leg. This is getting significantly worse despite being on Augmentin. I am actually extremely concerned about worsening infection and especially considering the way the wound looks today and the odor present. 05-04-2021 upon evaluation today patient appears to be doing better with regard to the infection in his leg. He has been tolerating the dressing changes without complication and overall I am extremely pleased with where we stand. There does not appear to be any evidence of active infection locally or systemically which is great news. 05-13-2021 upon evaluation today patient appears to be doing better in regard to his leg ulcer. He has been tolerating the dressing changes without complication. He does have still significant pain he does have a lot of necrotic tissue which is working its way off and there is no signs of significant infection at this point which is great news. With that being said I do believe that the patient does need to likely continue with the wound care measures at this point with regard to the dressing that we are utilizing. 05-20-2021 upon evaluation today patient's wound is actually showing signs of fairly good granulation and epithelization in a lot of areas he still has some slough noted at this point though a lot of the eschar is  softening up I do believe the Xeroform has been helpful in this regard. Fortunately I do not see any evidence of active infection locally or systemically which is great news. No fevers, chills, nausea, vomiting, or diarrhea. I do believe the 3 layer compression wrap has been beneficial. 05-27-2021 upon evaluation today patient's wound is actually showing signs of significant improvement with a lot of new epithelial growth at this point. Fortunately I do not see any signs of active infection locally or systemically which is great news and overall I think we are headed in the right direction. 06-03-2021 upon evaluation today patient appears to be doing well with regard to his wounds although he still has a lot of slough and biofilm buildup noted. We are going to need to try to do some sharp debridement today to clear this away and he is in agreement with allowing me to try what we can. I am not certain how much working to be able to remove or not. 06-10-2021 upon evaluation today patient appears to be doing well with regard to his wound from the overall appearance and size standpoint this is good. From a drainage standpoint and the odor as well this seems to still be infected in my opinion. I do not think the Augmentin was sufficient. With that being said I discussed this with the patient I do believe that he may benefit from topical [place] antibiotics in the meantime we will get a use gentamicin to bridge the gap until we get those. 06-17-2021 upon evaluation today patient appears to be doing better in regard to his leg. I have not been able to get him the [facility] as it was too expensive for the topical antibiotics. For that reason would been using gentamicin which I do feel like  is helping and I do think that is probably can to be our best bet at this point all things considered. He is in agreement with giving this a trial and seeing how things go. Fortunately I see no evidence of active infection  systemically locally some of the odor seems indicate there may be some bacteria present but I am not sure what is best to do past what we are already doing. 06-24-2021 upon evaluation today patient appears to be doing well with regard to his healing in fact he is got more epithelial growth than what we have seen previous. Fortunately I think that each week I see him this is looking better and better. Fortunately I see no signs of active infection at this time locally or systemically which is great news. 07-01-2021 upon evaluation today patient appears to be doing excellent in regard to his leg ulcers. He has been tolerating the dressing changes without complication. Fortunately I see no evidence of active infection locally or systemically at this time which is great news. Overall I am extremely happy with where we stand today. 7/5; patient presents for follow-up. We have been using gentamicin and Xeroform under compression therapy. He has no issues or complaints today. 07-15-2021 upon evaluation today patient appears to be doing well with regard to his leg is almost completely healed. Fortunately I do not see any evidence of active infection locally or systemically which is great news. No fevers, chills, nausea, vomiting, or diarrhea. 07-22-2021 upon evaluation patient appears to be almost completely healed. I thought we were going to be there today but we are not quite so. Fortunately I do not see any evidence of active infection locally or systemically which is great news and overall I am extremely pleased with where we stand currently. VONTRELL, PULLMAN ([id number]) Objective Constitutional Well-nourished and well-hydrated in no acute distress. Vitals Time Taken: 2:37 PM, Height: 70 in, Weight: 277 lbs, BMI: 39.7, Temperature: 98.1 F, Pulse: 67 bpm, Respiratory Rate: 18 breaths/min, Blood Pressure: 114/73 mmHg. Respiratory normal breathing without difficulty. Psychiatric this patient is  able to make decisions and demonstrates good insight into disease process. Alert and Oriented x 3. pleasant and cooperative. General Notes: Upon inspection patient's wound bed actually showed signs of good granulation and epithelization in fact he has very little remaining open at this point I think were very close to complete resolution. Integumentary (Hair, Skin) Wound #3 status is Open. Original cause of wound was Gradually Appeared. The date acquired was: 02/03/2021. The wound has been in treatment 15 weeks. The wound is located on the Right,Lateral,Circumferential Lower Leg. The wound measures 0.2cm length x 0.2cm width x 0.1cm depth; 0.031cm^2 area and 0.003cm^3 volume. There is Fat Layer (Subcutaneous Tissue) exposed. There is no tunneling or undermining noted. There is a medium amount of serosanguineous drainage noted. There is medium (34-66%) red, hyper - granulation within the wound bed. There is no necrotic tissue within the wound bed. Assessment Active Problems ICD-10 Type 2 diabetes mellitus with other skin ulcer Venous insufficiency (chronic) (peripheral) Non-pressure chronic ulcer of other part of left lower leg with fat layer exposed Non-pressure chronic ulcer of other part of right lower leg with fat layer exposed Essential (primary) hypertension Procedures Wound #3 Pre-procedure diagnosis of Wound #3 is a Diabetic Wound/Ulcer of the Lower Extremity located on the Right,Lateral,Circumferential Lower Leg . There was a Three Layer Compression Therapy Procedure by Levora Dredge, RN. Post procedure Diagnosis Wound #3: Same as Pre-Procedure Notes: pt tolerated  wrap without issue. Plan Follow-up Appointments: Return Appointment in 1 week. - 3 layer wrap every week Nurse Visit as needed Bathing/ Shower/ Hygiene: May shower; gently cleanse wound with antibacterial soap, rinse and pat dry prior to dressing wounds May shower with wound dressing protected with water repellent cover  or cast protector. No tub bath. Anesthetic (Use 'Patient Medications' Section for Anesthetic Order Entry): Lidocaine applied to wound bed Edema Control - Lymphedema / Segmental Compressive Device / Other: Elevate, Exercise Daily and Avoid Standing for Long Periods of Time. Elevate legs to the level of the heart and pump ankles as often as possible Shane Bean, Shane V. ([id number]) Elevate leg(s) parallel to the floor when sitting. Additional Orders / Instructions: Follow Nutritious Diet and Increase Protein Intake - monitor blood sugar WOUND #3: - Lower Leg Wound Laterality: Right, Lateral, Circumferential Cleanser: Soap and Water 1 x Per Week/15 Days Discharge Instructions: Gently cleanse wound with antibacterial soap, rinse and pat dry prior to dressing wounds Cleanser: Wound Cleanser 1 x Per Week/15 Days Discharge Instructions: Wash your hands with soap and water. Remove old dressing, discard into plastic bag and place into trash. Cleanse the wound with Wound Cleanser prior to applying a clean dressing using gauze sponges, not tissues or cotton balls. Do not scrub or use excessive force. Pat dry using gauze sponges, not tissue or cotton balls. Primary Dressing: Silvercel 4 1/4x 4 1/4 (in/in) 1 x Per Week/15 Days Discharge Instructions: Apply Silvercel 4 1/4x 4 1/4 (in/in) as instructed Secondary Dressing: ABD Pad 5x9 (in/in) 1 x Per Week/15 Days Discharge Instructions: 2 pads to Cover with ABD pad Compression Wrap: 3-LAYER WRAP - Profore Lite LF 3 Multilayer Compression Bandaging System 1 x Per Week/15 Days Discharge Instructions: Apply 3 multi-layer wrap as prescribed. 1. I would recommend that we go ahead and continue with the compression wrapping which I think is doing quite well. 2. I am also can recommend that we have the patient continue with the silver alginate to any open wounds I think that skin to be well to dry this out but hopefully get these last couple of the areas  completely sealed for discharge next week. He does have his own compression socks ready to go for soon as we say at this time. We will see patient back for reevaluation in 1 week here in the clinic. If anything worsens or changes patient will contact our office for additional recommendations. Electronic Signature(s) Signed: 07/22/2021 4:45:49 PM By: Worthy Keeler PA-C Entered By: Worthy Keeler on 07/22/2021 16:45:49 Shane Bean ([id number]) -------------------------------------------------------------------------------- SuperBill Details Patient Name: Shane Bean Date of Service: 07/22/2021 Medical Record Number: [id number] Patient Account Number: [id number] Date of Birth/Sex: November 23, 1955 (66 y.o. M) Treating RN: Levora Dredge Primary Care Provider: Reeves Dam Other Clinician: Referring Provider: Reeves Dam Treating Provider/Extender: Jeri Cos Weeks in Treatment: 15 Diagnosis Coding ICD-10 Codes Code Description E11.622 Type 2 diabetes mellitus with other skin ulcer I87.2 Venous insufficiency (chronic) (peripheral) 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 layer exposed L97.812 Non-pressure chronic ulcer of other part of right lower leg with fat layer exposed I10 Essential (primary) hypertension Facility Procedures CPT4 Code: [id number] Description: (Facility Use Only) [phone] - APPLY MULTLAY COMPRS LWR RT LEG Modifier: Quantity: 1 Physician Procedures CPT4 Code: [id number] Description: 78242 - WC PHYS LEVEL 3 - EST PT Modifier: Quantity: 1 CPT4 Code: Description: ICD-10 Diagnosis Description E11.622 Type 2 diabetes mellitus with other skin ulcer I87.2 Venous insufficiency (  chronic) (peripheral) [id number] Non-pressure chronic ulcer of other part of left lower leg with fat lay L97.812 Non-pressure  chronic ulcer of other part of right lower leg with fat la Modifier: er exposed yer exposed Quantity: Electronic  Signature(s) Signed: 07/22/2021 4:50:26 PM By: Worthy Keeler PA-C Previous Signature: 07/22/2021 4:27:06 PM Version By: Levora Dredge Entered By: Worthy Keeler on 07/22/2021 16:50:25

## 2021-07-22 NOTE — Progress Notes (Signed)
Shane, Bean ([id number]) Visit Report for 07/22/2021 Arrival Information Details Patient Name: Shane Bean, Shane Bean Date of Service: 07/22/2021 2:30 PM Medical Record Number: [id number] Patient Account Number: [id number] Date of Birth/Sex: 03-17-1955 (66 y.o. M) Treating RN: Levora Dredge Primary Care Ionia Schey: Reeves Dam Other Clinician: Referring Patrycja Mumpower: Reeves Dam Treating Dalin Caldera/Extender: Skipper Cliche in Treatment: 15 Visit Information History Since Last Visit Added or deleted any medications: No Patient Arrived: Ambulatory Any new allergies or adverse reactions: No Arrival Time: 14:36 Had a fall or experienced change in No Accompanied By: self activities of daily living that may affect Transfer Assistance: None risk of falls: Patient Identification Verified: Yes Hospitalized since last visit: No Secondary Verification Process Completed: Yes Has Dressing in Place as Prescribed: Yes Patient Requires Transmission-Based No Has Compression in Place as Prescribed: Yes Precautions: Pain Present Now: No Patient Has Alerts: Yes Patient Alerts: Patient on Blood Thinner ABI R [place] TBI .90 7/22 ABI L 1.46 TBI .76 7/22 Electronic Signature(s) Signed: 07/22/2021 4:27:06 PM By: Levora Dredge Entered By: Levora Dredge on 07/22/2021 14:37:08 Shane Bean ([id number]) -------------------------------------------------------------------------------- Clinic Level of Care Assessment Details Patient Name: Shane Bean Date of Service: 07/22/2021 2:30 PM Medical Record Number: [id number] Patient Account Number: [id number] Date of Birth/Sex: Aug 16, 1955 (66 y.o. M) Treating RN: Levora Dredge Primary Care Edelmiro Innocent: Reeves Dam Other Clinician: Referring Kelvon Giannini: Reeves Dam Treating Ples Trudel/Extender: Skipper Cliche in Treatment: 15 Clinic Level of Care Assessment Items TOOL 1 Quantity Score []  - Use when EandM and Procedure is performed on  INITIAL visit 0 ASSESSMENTS - Nursing Assessment / Reassessment []  - General Physical Exam (combine w/ comprehensive assessment (listed just below) when performed on new 0 pt. evals) []  - 0 Comprehensive Assessment (HX, ROS, Risk Assessments, Wounds Hx, etc.) ASSESSMENTS - Wound and Skin Assessment / Reassessment []  - Dermatologic / Skin Assessment (not related to wound area) 0 ASSESSMENTS - Ostomy and/or Continence Assessment and Care []  - Incontinence Assessment and Management 0 []  - 0 Ostomy Care Assessment and Management (repouching, etc.) PROCESS - Coordination of Care []  - Simple Patient / Family Education for ongoing care 0 []  - 0 Complex (extensive) Patient / Family Education for ongoing care []  - 0 Staff obtains Programmer, systems, Records, Test Results / Process Orders []  - 0 Staff telephones HHA, Nursing Homes / Clarify orders / etc []  - 0 Routine Transfer to another Facility (non-emergent condition) []  - 0 Routine Hospital Admission (non-emergent condition) []  - 0 New Admissions / Biomedical engineer / Ordering NPWT, Apligraf, etc. []  - 0 Emergency Hospital Admission (emergent condition) PROCESS - Special Needs []  - Pediatric / Minor Patient Management 0 []  - 0 Isolation Patient Management []  - 0 Hearing / Language / Visual special needs []  - 0 Assessment of Community assistance (transportation, D/C planning, etc.) []  - 0 Additional assistance / Altered mentation []  - 0 Support Surface(s) Assessment (bed, cushion, seat, etc.) INTERVENTIONS - Miscellaneous []  - External ear exam 0 []  - 0 Patient Transfer (multiple staff / Civil Service fast streamer / Similar devices) []  - 0 Simple Staple / Suture removal (25 or less) []  - 0 Complex Staple / Suture removal (26 or more) []  - 0 Hypo/Hyperglycemic Management (do not check if billed separately) []  - 0 Ankle / Brachial Index (ABI) - do not check if billed separately Has the patient been seen at the hospital within the last three  years: Yes Total Score: 0 Level Of Care: ____ Shane Bean ([id number]) Electronic Signature(s) Signed: 07/22/2021 4:27:06  PM By: Levora Dredge Entered By: Levora Dredge on 07/22/2021 15:06:26 Shane Bean ([id number]) -------------------------------------------------------------------------------- Compression Therapy Details Patient Name: Shane Bean Date of Service: 07/22/2021 2:30 PM Medical Record Number: [id number] Patient Account Number: [id number] Date of Birth/Sex: 01/30/1955 (65 y.o. M) Treating RN: Levora Dredge Primary Care Xzavier Swinger: Reeves Dam Other Clinician: Referring Jenalyn Girdner: Reeves Dam Treating Shacara Cozine/Extender: Jeri Cos Weeks in Treatment: 15 Compression Therapy Performed for Wound Assessment: Wound #3 Right,Lateral,Circumferential Lower Leg Performed By: Clinician Levora Dredge, RN Compression Type: Three Layer Post Procedure Diagnosis Same as Pre-procedure Notes pt tolerated wrap without issue Electronic Signature(s) Signed: 07/22/2021 4:27:06 PM By: Levora Dredge Entered By: Levora Dredge on 07/22/2021 15:06:08 Shane Bean ([id number]) -------------------------------------------------------------------------------- Encounter Discharge Information Details Patient Name: Shane Bean Date of Service: 07/22/2021 2:30 PM Medical Record Number: [id number] Patient Account Number: [id number] Date of Birth/Sex: [birth year] (65 y.o. M) Treating RN: Levora Dredge Primary Care Oreta Soloway: Reeves Dam Other Clinician: Referring Lashann Hagg: Reeves Dam Treating Jarielys Girardot/Extender: Skipper Cliche in Treatment: 15 Encounter Discharge Information Items Discharge Condition: Stable Ambulatory Status: Ambulatory Discharge Destination: Home Transportation: Private Auto Accompanied By: self Schedule Follow-up Appointment: Yes Clinical Summary of Care: Electronic Signature(s) Signed: 07/22/2021 4:27:06 PM By: Levora Dredge Entered By: Levora Dredge on 07/22/2021 15:07:13 Shane Bean ([id number]) -------------------------------------------------------------------------------- Lower Extremity Assessment Details Patient Name: Shane Bean Date of Service: 07/22/2021 2:30 PM Medical Record Number: [id number] Patient Account Number: [id number] Date of Birth/Sex: 03-04-1955 (65 y.o. M) Treating RN: Levora Dredge Primary Care Neal Trulson: Reeves Dam Other Clinician: Referring Etrulia Zarr: Reeves Dam Treating Karter Haire/Extender: Jeri Cos Weeks in Treatment: 15 Edema Assessment Assessed: [Left: No] [Right: No] Edema: [Left: Ye] [Right: s] Calf Left: Right: Point of Measurement: 34 cm From Medial Instep 40.5 cm Ankle Left: Right: Point of Measurement: 11 cm From Medial Instep 25 cm Vascular Assessment Pulses: Dorsalis Pedis Palpable: [Right:Yes] Electronic Signature(s) Signed: 07/22/2021 4:27:06 PM By: Levora Dredge Entered By: Levora Dredge on 07/22/2021 14:49:18 Shane Bean ([id number]) -------------------------------------------------------------------------------- Multi Wound Chart Details Patient Name: Shane Bean Date of Service: 07/22/2021 2:30 PM Medical Record Number: [id number] Patient Account Number: [id number] Date of Birth/Sex: [birth year] (65 y.o. M) Treating RN: Levora Dredge Primary Care Paublo Warshawsky: Reeves Dam Other Clinician: Referring Simcha Farrington: Reeves Dam Treating Samon Dishner/Extender: Skipper Cliche in Treatment: 15 Vital Signs Height(in): 70 Pulse(bpm): 80 Weight(lbs): 235 Blood Pressure(mmHg): 114/73 Body Mass Index(BMI): 39.7 Temperature(F): 98.1 Respiratory Rate(breaths/min): 18 Photos: [N/A:N/A] Wound Location: Right, Lateral, Circumferential N/A N/A Lower Leg Wounding Event: Gradually Appeared N/A N/A Primary Etiology: Diabetic Wound/Ulcer of the Lower N/A N/A Extremity Comorbid History: Hypertension, Type II Diabetes,  N/A N/A Neuropathy Date Acquired: 02/03/2021 N/A N/A Weeks of Treatment: 15 N/A N/A Wound Status: Open N/A N/A Wound Recurrence: No N/A N/A Measurements L x W x D (cm) 0.1x0.1x0.1 N/A N/A Area (cm) : 0.008 N/A N/A Volume (cm) : 0.001 N/A N/A % Reduction in Area: 100.00% N/A N/A % Reduction in Volume: 100.00% N/A N/A Classification: Grade 2 N/A N/A Exudate Amount: Medium N/A N/A Exudate Type: Serosanguineous N/A N/A Exudate Color: red, brown N/A N/A Granulation Amount: Medium (34-66%) N/A N/A Granulation Quality: Red, Hyper-granulation N/A N/A Necrotic Amount: None Present (0%) N/A N/A Exposed Structures: Fat Layer (Subcutaneous Tissue): N/A N/A Yes Fascia: No Tendon: No Muscle: No Joint: No Bone: No Epithelialization: Medium (34-66%) N/A N/A Treatment Notes Electronic Signature(s) Signed: 07/22/2021 4:27:06 PM By: Levora Dredge Entered By: Levora Dredge on 07/22/2021 14:49:33 Shane Bean ([id number]) -------------------------------------------------------------------------------- [place]  Details Patient Name: FARAZ, PONCIANO Date of Service: 07/22/2021 2:30 PM Medical Record Number: [id number] Patient Account Number: [id number] Date of Birth/Sex: March 24, 1955 (66 y.o. M) Treating RN: Levora Dredge Primary Care Deryk Bozman: Reeves Dam Other Clinician: Referring Guinevere Stephenson: Reeves Dam Treating Monia Timmers/Extender: Skipper Cliche in Treatment: 15 Active Inactive Necrotic Tissue Nursing Diagnoses: Impaired tissue integrity related to necrotic/devitalized tissue Knowledge deficit related to management of necrotic/devitalized tissue Goals: Necrotic/devitalized tissue will be minimized in the wound bed Date Initiated: 04/15/2021 Target Resolution Date: 04/15/2021 Goal Status: Active Patient/caregiver will verbalize understanding of reason and process for debridement of necrotic tissue Date Initiated: 04/15/2021 Date Inactivated:  05/20/2021 Target Resolution Date: 04/15/2021 Goal Status: Met Interventions: Assess patient pain level pre-, during and post procedure and prior to discharge Provide education on necrotic tissue and debridement process Notes: Wound/Skin Impairment Nursing Diagnoses: Knowledge deficit related to ulceration/compromised skin integrity Goals: Patient/caregiver will verbalize understanding of skin care regimen Date Initiated: 04/08/2021 Date Inactivated: 05/04/2021 Target Resolution Date: 05/08/2021 Goal Status: Met Ulcer/skin breakdown will have a volume reduction of 30% by week 4 Date Initiated: 04/08/2021 Target Resolution Date: 06/08/2021 Goal Status: Active Ulcer/skin breakdown will have a volume reduction of 50% by week 8 Date Initiated: 04/08/2021 Target Resolution Date: 07/08/2021 Goal Status: Active Ulcer/skin breakdown will have a volume reduction of 80% by week 12 Date Initiated: 04/08/2021 Target Resolution Date: 08/08/2021 Goal Status: Active Ulcer/skin breakdown will heal within 14 weeks Date Initiated: 04/08/2021 Target Resolution Date: 09/08/2021 Goal Status: Active Interventions: Assess patient/caregiver ability to obtain necessary supplies Assess patient/caregiver ability to perform ulcer/skin care regimen upon admission and as needed Assess ulceration(s) every visit Notes: Electronic Signature(s) Signed: 07/22/2021 4:27:06 PM By: Levora Dredge Entered By: Levora Dredge on 07/22/2021 14:49:25 Shane Bean ([id number]) Shane Bean ([id number]) -------------------------------------------------------------------------------- Pain Assessment Details Patient Name: Shane Bean Date of Service: 07/22/2021 2:30 PM Medical Record Number: [id number] Patient Account Number: [id number] Date of Birth/Sex: [birth year] (65 y.o. M) Treating RN: Levora Dredge Primary Care Kwinton Maahs: Reeves Dam Other Clinician: Referring Sophea Rackham: Reeves Dam Treating  Bridget [place]/Extender: Skipper Cliche in Treatment: 15 Active Problems Location of Pain Severity and Description of Pain Patient Has Paino No Site Locations Rate the pain. Current Pain Level: 0 Pain Management and Medication Current Pain Management: Electronic Signature(s) Signed: 07/22/2021 4:27:06 PM By: Levora Dredge Entered By: Levora Dredge on 07/22/2021 14:40:01 Shane Bean ([id number]) -------------------------------------------------------------------------------- Patient/Caregiver Education Details Patient Name: Shane Bean Date of Service: 07/22/2021 2:30 PM Medical Record Number: [id number] Patient Account Number: [id number] Date of Birth/Gender: 11-Aug-1955 (65 y.o. M) Treating RN: Levora Dredge Primary Care Physician: Reeves Dam Other Clinician: Referring Physician: Reeves Dam Treating Physician/Extender: Skipper Cliche in Treatment: 15 Education Assessment Education Provided To: Patient Education Topics Provided Wound/Skin Impairment: Handouts: Caring for Your Ulcer Methods: Explain/Verbal Responses: State content correctly Electronic Signature(s) Signed: 07/22/2021 4:27:06 PM By: Levora Dredge Entered By: Levora Dredge on 07/22/2021 15:06:48 Shane Bean ([id number]) -------------------------------------------------------------------------------- Wound Assessment Details Patient Name: Shane Bean Date of Service: 07/22/2021 2:30 PM Medical Record Number: [id number] Patient Account Number: [id number] Date of Birth/Sex: 06/15/1955 (65 y.o. M) Treating RN: Levora Dredge Primary Care Troyce Febo: Reeves Dam Other Clinician: Referring Carmalita Wakefield: Reeves Dam Treating Carmelite Violet/Extender: Jeri Cos Weeks in Treatment: 15 Wound Status Wound Number: 3 Primary Etiology: Diabetic Wound/Ulcer of the Lower Extremity Wound Location: Right, Lateral, Circumferential Lower Leg Wound Status: Open Wounding Event: Gradually  Appeared Comorbid History: Hypertension, Type II Diabetes, Neuropathy Date Acquired: 02/03/2021 Weeks Of Treatment:  15 Clustered Wound: No Photos Wound Measurements Length: (cm) 0.2 Width: (cm) 0.2 Depth: (cm) 0.1 Area: (cm) 0.031 Volume: (cm) 0.003 % Reduction in Area: 100% % Reduction in Volume: 100% Epithelialization: Medium (34-66%) Tunneling: No Undermining: No Wound Description Classification: Grade 2 Exudate Amount: Medium Exudate Type: Serosanguineous Exudate Color: red, brown Foul Odor After Cleansing: No Slough/Fibrino Yes Wound Bed Granulation Amount: Medium (34-66%) Exposed Structure Granulation Quality: Red, Hyper-granulation Fascia Exposed: No Necrotic Amount: None Present (0%) Fat Layer (Subcutaneous Tissue) Exposed: Yes Tendon Exposed: No Muscle Exposed: No Joint Exposed: No Bone Exposed: No Treatment Notes Wound #3 (Lower Leg) Wound Laterality: Right, Lateral, Circumferential Cleanser Soap and Water Discharge Instruction: Gently cleanse wound with antibacterial soap, rinse and pat dry prior to dressing wounds Wound Cleanser Discharge Instruction: Wash your hands with soap and water. Remove old dressing, discard into plastic bag and place into trash. Cleanse the wound with Wound Cleanser prior to applying a clean dressing using gauze sponges, not tissues or cotton balls. Do not Magar, Nainoa V. ([id number]) scrub or use excessive force. Pat dry using gauze sponges, not tissue or cotton balls. Peri-Wound Care Topical Primary Dressing Silvercel 4 1/4x 4 1/4 (in/in) Discharge Instruction: Apply Silvercel 4 1/4x 4 1/4 (in/in) as instructed Secondary Dressing ABD Pad 5x9 (in/in) Discharge Instruction: 2 pads to Cover with ABD pad Secured With Compression Wrap 3-LAYER WRAP - Profore Lite LF 3 Multilayer Compression Bandaging System Discharge Instruction: Apply 3 multi-layer wrap as prescribed. Compression Stockings Add-Ons Electronic  Signature(s) Signed: 07/22/2021 4:27:06 PM By: Levora Dredge Entered By: Levora Dredge on 07/22/2021 14:56:32 Shane Bean ([id number]) -------------------------------------------------------------------------------- Vitals Details Patient Name: Shane Bean Date of Service: 07/22/2021 2:30 PM Medical Record Number: [id number] Patient Account Number: [id number] Date of Birth/Sex: 1955-10-11 (65 y.o. M) Treating RN: Levora Dredge Primary Care Ithzel Fedorchak: Reeves Dam Other Clinician: Referring [place] Whedbee: Reeves Dam Treating Keira Bohlin/Extender: Skipper Cliche in Treatment: 15 Vital Signs Time Taken: 14:37 Temperature (F): 98.1 Height (in): 70 Pulse (bpm): 67 Weight (lbs): 277 Respiratory Rate (breaths/min): 18 Body Mass Index (BMI): 39.7 Blood Pressure (mmHg): 114/73 Reference Range: 80 - 120 mg / dl Electronic Signature(s) Signed: 07/22/2021 4:27:06 PM By: Levora Dredge Entered By: Levora Dredge on 07/22/2021 14:39:53

## 2021-07-27 ENCOUNTER — Ambulatory Visit: Payer: Medicare (Managed Care) | Admitting: *Deleted

## 2021-07-27 ENCOUNTER — Telehealth: Payer: Self-pay | Admitting: *Deleted

## 2021-07-27 NOTE — Telephone Encounter (Signed)
Patient didn't show for his appointment. Called him and he reported that he forgot and wanted to reschedule. Next appointment scheduled for August 23, 2021 at 3:15 pm.

## 2021-07-29 ENCOUNTER — Encounter: Payer: Medicare (Managed Care) | Admitting: Internal Medicine

## 2021-07-29 DIAGNOSIS — E11622 Type 2 diabetes mellitus with other skin ulcer: Secondary | ICD-10-CM | POA: Diagnosis not present

## 2021-07-29 NOTE — Progress Notes (Signed)
Shane Bean, Shane Bean ([id number]) Visit Report for 07/29/2021 HPI Details Patient Name: Shane Bean, Shane Bean Date of Service: 07/29/2021 2:30 PM Medical Record Number: [id number] Patient Account Number: [id number] Date of Birth/Sex: 1955-11-13 (66 y.o. M) Treating RN: Cornell Barman Primary Care Provider: Reeves Dam Other Clinician: Massie Kluver Referring Provider: Reeves Dam Treating Provider/Extender: Tito Dine in Treatment: 16 History of Present Illness HPI Description: 05/18/2020 upon evaluation today patient appears for initial evaluation here in clinic concerning issues that he has been having with the wound on his left lateral leg. Fortunately there does not appear to be any signs of obvious an active infection at this time which is great news. With that being said the patient unfortunately is continued to have issues with pain although he tells me it is gotten a lot better. He was on Keflex as well as Bactrim DS which seems to have done a good job there. His most recent hemoglobin A1c was 9.8 that was on 04/14/2020. Subsequently I do feel like that the patient is making progress here. He does have a history of chronic venous insufficiency as well as hypertension. I do not see any need for antibiotics at this point. 5/25; follow-up of the wound on the left posterior calf. This is completely necrotic on the surface. It does not look infected but it is painful. He is a diabetic but I think there is some suggestion here of chronic venous disease as well. We used Iodoflex under compression last week 6/1; again a completely necrotic surface on this with the wound. We have been using Iodoflex under compression he complains of gnawing pain. He has had wounds previously a lot of this looks like chronic venous disease. 6/8; about two thirds of the surface of this wound is still covered in a black necrotic eschar. I changed him from Iodoflex to [place] last week because of complaints  of pain. He actually was seen by her neurologist this weeko Peripheral neuropathy as a cause of the pain and they changed him from Neurontin to Lyrica but he still has not had any relief. He says that most of the pain however is in his heel not in his wound per se. He is a diabetic 6/15. This is a patient with what looks to be a venous wound on the left lateral lower leg. He is also a diabetic. Currently being worked up for diabetic neuropathy. He complains of pain out of proportion to the size of the wound although to be fair the entire area here was eschared. We have been working to get a viable surface. We are using Sorbact 6/22; fairly painful wound on the left posterior calf. I am assuming this is been venous he is also a diabetic. His ABI in our clinic was noncompressible however he has easily palpable pulses on his feet. He complains of a lot of pain in the wound also of the left heel and the upper left calf. Some of this may be neuropathy. It is led me to discontinue Iodoflex reduce his compression but he still complains of pain in fact he says he could not take a debridement today. When I first saw this it was completely necrotic surface we have got it down to something that looks a reasonably healthy I have been wondering about biopsying this area however he is on Coumadin and with the pain I have put this off. The major question would be an inflammatory ulcer such as pyoderma. The patient is not aware of how  this started. He does however have a wound history on both legs. 6/29; patient presents for 1 week follow-up. He has been using Hydrofera Blue under Kerlix/Coban. He has tolerated this well. He currently denies signs of infection. He also declines debridement today. He he states it is painful and does not want to have this done. 7/6; Patient presents for 1 week follow-up. He has been using Hydrofera Blue under Kerlix/Coban. He denies signs of infection. He declines debridement  today. 7/15; patient presents for 1 week follow-up. He has been using Hydrofera Blue under Kerlix/Coban. He denies signs of infection today. He is agreeable to having debridement done today. 7/20; patient presents for 1 week follow-up. He is in good spirits today. He has been using Hydrofera Blue under Kerlix/Coban. He denies signs of infection today. 7/27; patient presents for 1 week follow-up. He is in good spirits today. He has no issues or complaints today. He denies signs of infection. 08/12/2020 upon evaluation today patient appears to be doing better in regard to his wound. He has been tolerating the dressing changes without complication. With that being said he did have arterial studies and does show that he has a TBI on the right of 0.90 and a TBI on the left of 0.76 which is excellent. There is no evidence of arterial compromise at this point. 8/17; patient presents for 1 week follow-up. He has no issues or complaints today. He denies signs of infection. 8/24; patient presents for 1 week follow-up. He has no issues or complaints today. He denies signs of infection. He continues to tolerate the compression wrap well. 8/31; patient presents for 1 week follow-up. He has no issues or complaints today. He denies signs of infection. He is in good spirits today. 9/7; patient presents for 1 week follow-up. He is tolerated the compression wrap well. He has no issues or complaints today. He denies signs of infection. 9/14; patient presents for 1 week follow-up. He has no issues or complaints today. He denies signs of infection. He is in excellent spirits today. 9/21; patient presents for 1 week follow-up. He has no issues or complaints today. 9/28; patient presents for 1 week follow-up. He has tolerated the compression wrap well. He has no issues or complaints today. Shane Bean, Shane Bean ([id number]) 10/5; patient presents for 1 week follow-up. He has no issues or complaints today. 10/12; patient  presents for 1 week follow-up. He has no issues or complaints today. He denies signs of infection. Readmission: 04-08-2021 upon evaluation today patient appears to be doing poorly in regard to his right lower extremity. He is being seen for reevaluation here in the clinic he was last discharged healed in October 2022. Subsequently at that time it was noted in the chart that he was told to be wearing compression socks unfortunately the patient tells me he never remembers being told that. I did discuss this with him today as well as Hosie Poisson discussing it with him today again. Obviously he does know at this point once he is healed he should be wearing compression socks all the time from the time he gets up in the morning to when he goes to sleep at night day in and day out. I think this is the only way that you can to keep things under control. Otherwise his medical history has not changed his arterial study in the past was excellent and did not appear to show any signs of issue at this point which is great news as well. 04-15-2021  upon evaluation today patient's wound is actually looking much drier than what it was last week. Again I do believe the infection is showing signs of improvement although he is having pain for sore I think this has to do more with the dryness of the wound at this time as opposed to the infection that we were noted in the last week. Fortunately I do not see any evidence of active infection locally or systemically at this time which is great news. 04-22-2021 upon evaluation today patient unfortunately continues to have issues with his leg. This is getting significantly worse despite being on Augmentin. I am actually extremely concerned about worsening infection and especially considering the way the wound looks today and the odor present. 05-04-2021 upon evaluation today patient appears to be doing better with regard to the infection in his leg. He has been tolerating the  dressing changes without complication and overall I am extremely pleased with where we stand. There does not appear to be any evidence of active infection locally or systemically which is great news. 05-13-2021 upon evaluation today patient appears to be doing better in regard to his leg ulcer. He has been tolerating the dressing changes without complication. He does have still significant pain he does have a lot of necrotic tissue which is working its way off and there is no signs of significant infection at this point which is great news. With that being said I do believe that the patient does need to likely continue with the wound care measures at this point with regard to the dressing that we are utilizing. 05-20-2021 upon evaluation today patient's wound is actually showing signs of fairly good granulation and epithelization in a lot of areas he still has some slough noted at this point though a lot of the eschar is softening up I do believe the Xeroform has been helpful in this regard. Fortunately I do not see any evidence of active infection locally or systemically which is great news. No fevers, chills, nausea, vomiting, or diarrhea. I do believe the 3 layer compression wrap has been beneficial. 05-27-2021 upon evaluation today patient's wound is actually showing signs of significant improvement with a lot of new epithelial growth at this point. Fortunately I do not see any signs of active infection locally or systemically which is great news and overall I think we are headed in the right direction. 06-03-2021 upon evaluation today patient appears to be doing well with regard to his wounds although he still has a lot of slough and biofilm buildup noted. We are going to need to try to do some sharp debridement today to clear this away and he is in agreement with allowing me to try what we can. I am not certain how much working to be able to remove or not. 06-10-2021 upon evaluation today patient  appears to be doing well with regard to his wound from the overall appearance and size standpoint this is good. From a drainage standpoint and the odor as well this seems to still be infected in my opinion. I do not think the Augmentin was sufficient. With that being said I discussed this with the patient I do believe that he may benefit from topical [place] antibiotics in the meantime we will get a use gentamicin to bridge the gap until we get those. 06-17-2021 upon evaluation today patient appears to be doing better in regard to his leg. I have not been able to get him the [place] as it was too  expensive for the topical antibiotics. For that reason would been using gentamicin which I do feel like is helping and I do think that is probably can to be our best bet at this point all things considered. He is in agreement with giving this a trial and seeing how things go. Fortunately I see no evidence of active infection systemically locally some of the odor seems indicate there may be some bacteria present but I am not sure what is best to do past what we are already doing. 06-24-2021 upon evaluation today patient appears to be doing well with regard to his healing in fact he is got more epithelial growth than what we have seen previous. Fortunately I think that each week I see him this is looking better and better. Fortunately I see no signs of active infection at this time locally or systemically which is great news. 07-01-2021 upon evaluation today patient appears to be doing excellent in regard to his leg ulcers. He has been tolerating the dressing changes without complication. Fortunately I see no evidence of active infection locally or systemically at this time which is great news. Overall I am extremely happy with where we stand today. 7/5; patient presents for follow-up. We have been using gentamicin and Xeroform under compression therapy. He has no issues or complaints today. 07-15-2021 upon  evaluation today patient appears to be doing well with regard to his leg is almost completely healed. Fortunately I do not see any evidence of active infection locally or systemically which is great news. No fevers, chills, nausea, vomiting, or diarrhea. 07-22-2021 upon evaluation patient appears to be almost completely healed. I thought we were going to be there today but we are not quite so. Fortunately I do not see any evidence of active infection locally or systemically which is great news and overall I am extremely pleased with where we stand currently. 7/27; the patient's wounds on the right lower leg which are secondary to chronic venous insufficiency are healed. His edema control is excellent. He has 1 pair of 20/30 below-knee stockings from elastic therapy. Unfortunately he did not bring those in today however he is going straight home Shane Bean, Shane Bean ([id number]) Electronic Signature(s) Signed: 07/29/2021 4:27:18 PM By: Linton Ham MD Entered By: Linton Ham on 07/29/2021 15:17:15 Shane Bean ([id number]) -------------------------------------------------------------------------------- Physical Exam Details Patient Name: Shane Bean Date of Service: 07/29/2021 2:30 PM Medical Record Number: [id number] Patient Account Number: [id number] Date of Birth/Sex: [birth year] (65 y.o. M) Treating RN: Cornell Barman Primary Care Provider: Reeves Dam Other Clinician: Massie Kluver Referring Provider: Reeves Dam Treating Provider/Extender: Tito Dine in Treatment: 16 Constitutional Sitting or standing Blood Pressure is within target range for patient.. Pulse regular and within target range for patient.Marland Kitchen Respirations regular, non- labored and within target range.. Temperature is normal and within the target range for the patient.Marland Kitchen appears in no distress. Notes Wound exam; everything on the right lower leg is healed. He has good edema control. Electronic  Signature(s) Signed: 07/29/2021 4:27:18 PM By: Linton Ham MD Entered By: Linton Ham on 07/29/2021 15:18:27 Shane Bean ([id number]) -------------------------------------------------------------------------------- Physician Orders Details Patient Name: Shane Bean Date of Service: 07/29/2021 2:30 PM Medical Record Number: [id number] Patient Account Number: [id number] Date of Birth/Sex: 04/21/1955 (65 y.o. M) Treating RN: Cornell Barman Primary Care Provider: Reeves Dam Other Clinician: Massie Kluver Referring Provider: Reeves Dam Treating Provider/Extender: Tito Dine in Treatment: 25 Verbal / Phone Orders: No Diagnosis Coding Discharge From [facility]  Services o Discharge from [place] Treatment Complete - wound healed Please call office if any issues arise o Moisturize legs daily after removing compression garments. o Elevate, Exercise Daily and Avoid Standing for Long Periods of Time. o DO YOUR BEST to sleep in the bed at night. DO NOT sleep in your recliner. Long hours of sitting in a recliner leads to swelling of the legs and/or potential wounds on your backside. Edema Control - Lymphedema / Segmental Compressive Device / Other o Patient to wear own compression stockings. Remove compression stockings every night before going to bed and put on every morning when getting up. - both legs Electronic Signature(s) Signed: 07/29/2021 4:20:19 PM By: Massie Kluver Signed: 07/29/2021 4:27:18 PM By: Linton Ham MD Entered By: Massie Kluver on 07/29/2021 15:15:29 Shane Bean ([id number]) -------------------------------------------------------------------------------- Problem List Details Patient Name: Shane Bean Date of Service: 07/29/2021 2:30 PM Medical Record Number: [id number] Patient Account Number: [id number] Date of Birth/Sex: 02/05/1955 (65 y.o. M) Treating RN: Cornell Barman Primary Care Provider: Reeves Dam Other Clinician: Massie Kluver Referring Provider: Reeves Dam Treating Provider/Extender: Tito Dine in Treatment: 16 Active Problems ICD-10 Encounter Code Description Active Date MDM Diagnosis E11.622 Type 2 diabetes mellitus with other skin ulcer 04/08/2021 No Yes I87.2 Venous insufficiency (chronic) (peripheral) 04/08/2021 No Yes 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 layer 04/08/2021 No Yes exposed L97.812 Non-pressure chronic ulcer of other part of right lower leg with fat layer 04/08/2021 No Yes exposed [place] (primary) hypertension 04/08/2021 No Yes Inactive Problems Resolved Problems Electronic Signature(s) Signed: 07/29/2021 4:27:18 PM By: Linton Ham MD Entered By: Linton Ham on 07/29/2021 15:16:23 Shane Bean ([id number]) -------------------------------------------------------------------------------- Progress Note Details Patient Name: Shane Bean Date of Service: 07/29/2021 2:30 PM Medical Record Number: [id number] Patient Account Number: [id number] Date of Birth/Sex: [birth year] (65 y.o. M) Treating RN: Cornell Barman Primary Care Provider: Reeves Dam Other Clinician: Massie Kluver Referring Provider: Reeves Dam Treating Provider/Extender: Tito Dine in Treatment: 16 Subjective History of Present Illness (HPI) 05/18/2020 upon evaluation today patient appears for initial evaluation here in clinic concerning issues that he has been having with the wound on his left lateral leg. Fortunately there does not appear to be any signs of obvious an active infection at this time which is great news. With that being said the patient unfortunately is continued to have issues with pain although he tells me it is gotten a lot better. He was on Keflex as well as Bactrim DS which seems to have done a good job there. His most recent hemoglobin A1c was 9.8 that was on 04/14/2020. Subsequently  I do feel like that the patient is making progress here. He does have a history of chronic venous insufficiency as well as hypertension. I do not see any need for antibiotics at this point. 5/25; follow-up of the wound on the left posterior calf. This is completely necrotic on the surface. It does not look infected but it is painful. He is a diabetic but I think there is some suggestion here of chronic venous disease as well. We used Iodoflex under compression last week 6/1; again a completely necrotic surface on this with the wound. We have been using Iodoflex under compression he complains of gnawing pain. He has had wounds previously a lot of this looks like chronic venous disease. 6/8; about two thirds of the surface of this wound is still covered in a black necrotic  eschar. I changed him from Iodoflex to [place] last week because of complaints of pain. He actually was seen by her neurologist this weeko Peripheral neuropathy as a cause of the pain and they changed him from Neurontin to Lyrica but he still has not had any relief. He says that most of the pain however is in his heel not in his wound per se. He is a diabetic 6/15. This is a patient with what looks to be a venous wound on the left lateral lower leg. He is also a diabetic. Currently being worked up for diabetic neuropathy. He complains of pain out of proportion to the size of the wound although to be fair the entire area here was eschared. We have been working to get a viable surface. We are using Sorbact 6/22; fairly painful wound on the left posterior calf. I am assuming this is been venous he is also a diabetic. His ABI in our clinic was noncompressible however he has easily palpable pulses on his feet. He complains of a lot of pain in the wound also of the left heel and the upper left calf. Some of this may be neuropathy. It is led me to discontinue Iodoflex reduce his compression but he still complains of pain in fact he says he  could not take a debridement today. When I first saw this it was completely necrotic surface we have got it down to something that looks a reasonably healthy I have been wondering about biopsying this area however he is on Coumadin and with the pain I have put this off. The major question would be an inflammatory ulcer such as pyoderma. The patient is not aware of how this started. He does however have a wound history on both legs. 6/29; patient presents for 1 week follow-up. He has been using Hydrofera Blue under Kerlix/Coban. He has tolerated this well. He currently denies signs of infection. He also declines debridement today. He he states it is painful and does not want to have this done. 7/6; Patient presents for 1 week follow-up. He has been using Hydrofera Blue under Kerlix/Coban. He denies signs of infection. He declines debridement today. 7/15; patient presents for 1 week follow-up. He has been using Hydrofera Blue under Kerlix/Coban. He denies signs of infection today. He is agreeable to having debridement done today. 7/20; patient presents for 1 week follow-up. He is in good spirits today. He has been using Hydrofera Blue under Kerlix/Coban. He denies signs of infection today. 7/27; patient presents for 1 week follow-up. He is in good spirits today. He has no issues or complaints today. He denies signs of infection. 08/12/2020 upon evaluation today patient appears to be doing better in regard to his wound. He has been tolerating the dressing changes without complication. With that being said he did have arterial studies and does show that he has a TBI on the right of 0.90 and a TBI on the left of 0.76 which is excellent. There is no evidence of arterial compromise at this point. 8/17; patient presents for 1 week follow-up. He has no issues or complaints today. He denies signs of infection. 8/24; patient presents for 1 week follow-up. He has no issues or complaints today. He denies signs of  infection. He continues to tolerate the compression wrap well. 8/31; patient presents for 1 week follow-up. He has no issues or complaints today. He denies signs of infection. He is in good spirits today. 9/7; patient presents for 1 week follow-up. He is  tolerated the compression wrap well. He has no issues or complaints today. He denies signs of infection. 9/14; patient presents for 1 week follow-up. He has no issues or complaints today. He denies signs of infection. He is in excellent spirits today. 9/21; patient presents for 1 week follow-up. He has no issues or complaints today. 9/28; patient presents for 1 week follow-up. He has tolerated the compression wrap well. He has no issues or complaints today. 10/5; patient presents for 1 week follow-up. He has no issues or complaints today. 10/12; patient presents for 1 week follow-up. He has no issues or complaints today. He denies signs of infection. Shane Bean, Shane Bean ([id number]) Readmission: 04-08-2021 upon evaluation today patient appears to be doing poorly in regard to his right lower extremity. He is being seen for reevaluation here in the clinic he was last discharged healed in October 2022. Subsequently at that time it was noted in the chart that he was told to be wearing compression socks unfortunately the patient tells me he never remembers being told that. I did discuss this with him today as well as Hosie Poisson discussing it with him today again. Obviously he does know at this point once he is healed he should be wearing compression socks all the time from the time he gets up in the morning to when he goes to sleep at night day in and day out. I think this is the only way that you can to keep things under control. Otherwise his medical history has not changed his arterial study in the past was excellent and did not appear to show any signs of issue at this point which is great news as well. 04-15-2021 upon evaluation today patient's wound is  actually looking much drier than what it was last week. Again I do believe the infection is showing signs of improvement although he is having pain for sore I think this has to do more with the dryness of the wound at this time as opposed to the infection that we were noted in the last week. Fortunately I do not see any evidence of active infection locally or systemically at this time which is great news. 04-22-2021 upon evaluation today patient unfortunately continues to have issues with his leg. This is getting significantly worse despite being on Augmentin. I am actually extremely concerned about worsening infection and especially considering the way the wound looks today and the odor present. 05-04-2021 upon evaluation today patient appears to be doing better with regard to the infection in his leg. He has been tolerating the dressing changes without complication and overall I am extremely pleased with where we stand. There does not appear to be any evidence of active infection locally or systemically which is great news. 05-13-2021 upon evaluation today patient appears to be doing better in regard to his leg ulcer. He has been tolerating the dressing changes without complication. He does have still significant pain he does have a lot of necrotic tissue which is working its way off and there is no signs of significant infection at this point which is great news. With that being said I do believe that the patient does need to likely continue with the wound care measures at this point with regard to the dressing that we are utilizing. 05-20-2021 upon evaluation today patient's wound is actually showing signs of fairly good granulation and epithelization in a lot of areas he still has some slough noted at this point though a lot of the eschar is  softening up I do believe the Xeroform has been helpful in this regard. Fortunately I do not see any evidence of active infection locally or systemically which  is great news. No fevers, chills, nausea, vomiting, or diarrhea. I do believe the 3 layer compression wrap has been beneficial. 05-27-2021 upon evaluation today patient's wound is actually showing signs of significant improvement with a lot of new epithelial growth at this point. Fortunately I do not see any signs of active infection locally or systemically which is great news and overall I think we are headed in the right direction. 06-03-2021 upon evaluation today patient appears to be doing well with regard to his wounds although he still has a lot of slough and biofilm buildup noted. We are going to need to try to do some sharp debridement today to clear this away and he is in agreement with allowing me to try what we can. I am not certain how much working to be able to remove or not. 06-10-2021 upon evaluation today patient appears to be doing well with regard to his wound from the overall appearance and size standpoint this is good. From a drainage standpoint and the odor as well this seems to still be infected in my opinion. I do not think the Augmentin was sufficient. With that being said I discussed this with the patient I do believe that he may benefit from topical [facility] antibiotics in the meantime we will get a use gentamicin to bridge the gap until we get those. 06-17-2021 upon evaluation today patient appears to be doing better in regard to his leg. I have not been able to get him the [facility] as it was too expensive for the topical antibiotics. For that reason would been using gentamicin which I do feel like is helping and I do think that is probably can to be our best bet at this point all things considered. He is in agreement with giving this a trial and seeing how things go. Fortunately I see no evidence of active infection systemically locally some of the odor seems indicate there may be some bacteria present but I am not sure what is best to do past what we are already  doing. 06-24-2021 upon evaluation today patient appears to be doing well with regard to his healing in fact he is got more epithelial growth than what we have seen previous. Fortunately I think that each week I see him this is looking better and better. Fortunately I see no signs of active infection at this time locally or systemically which is great news. 07-01-2021 upon evaluation today patient appears to be doing excellent in regard to his leg ulcers. He has been tolerating the dressing changes without complication. Fortunately I see no evidence of active infection locally or systemically at this time which is great news. Overall I am extremely happy with where we stand today. 7/5; patient presents for follow-up. We have been using gentamicin and Xeroform under compression therapy. He has no issues or complaints today. 07-15-2021 upon evaluation today patient appears to be doing well with regard to his leg is almost completely healed. Fortunately I do not see any evidence of active infection locally or systemically which is great news. No fevers, chills, nausea, vomiting, or diarrhea. 07-22-2021 upon evaluation patient appears to be almost completely healed. I thought we were going to be there today but we are not quite so. Fortunately I do not see any evidence of active infection locally or systemically which is  great news and overall I am extremely pleased with where we stand currently. 7/27; the patient's wounds on the right lower leg which are secondary to chronic venous insufficiency are healed. His edema control is excellent. He has 1 pair of 20/30 below-knee stockings from elastic therapy. Unfortunately he did not bring those in today however he is going straight home Shane Bean, Shane V. ([id number]) Objective Constitutional Sitting or standing Blood Pressure is within target range for patient.. Pulse regular and within target range for patient.Marland Kitchen Respirations regular, non- labored and  within target range.. Temperature is normal and within the target range for the patient.Marland Kitchen appears in no distress. Vitals Time Taken: 2:51 PM, Height: 70 in, Weight: 277 lbs, BMI: 39.7, Temperature: 98.3 F, Pulse: 64 bpm, Respiratory Rate: 18 breaths/min, Blood Pressure: 127/70 mmHg. General Notes: Wound exam; everything on the right lower leg is healed. He has good edema control. Integumentary (Hair, Skin) Wound #3 status is Healed - Epithelialized. Original cause of wound was Gradually Appeared. The date acquired was: 02/03/2021. The wound has been in treatment 16 weeks. The wound is located on the Right,Lateral,Circumferential Lower Leg. The wound measures 0cm length x 0cm width x 0cm depth; 0cm^2 area and 0cm^3 volume. There is Fat Layer (Subcutaneous Tissue) exposed. There is a medium amount of serosanguineous drainage noted. There is medium (34-66%) red granulation within the wound bed. There is no necrotic tissue within the wound bed. Assessment Active Problems ICD-10 Type 2 diabetes mellitus with other skin ulcer Venous insufficiency (chronic) (peripheral) Non-pressure chronic ulcer of other part of left lower leg with fat layer exposed Non-pressure chronic ulcer of other part of right lower leg with fat layer exposed Essential (primary) hypertension Plan Discharge From [facility] Services: Discharge from [place] Treatment Complete - wound healed Please call office if any issues arise Moisturize legs daily after removing compression garments. Elevate, Exercise Daily and Avoid Standing for Long Periods of Time. DO YOUR BEST to sleep in the bed at night. DO NOT sleep in your recliner. Long hours of sitting in a recliner leads to swelling of the legs and/or potential wounds on your backside. Edema Control - Lymphedema / Segmental Compressive Device / Other: Patient to wear own compression stockings. Remove compression stockings every night before going to bed and put on every morning  when getting up. - both legs 1. The patient can be discharged from the wound care center 2. I told him he is likely to require more than 1 pair of stockings 3. We talked about moisturizing nightly Electronic Signature(s) Signed: 07/29/2021 4:27:18 PM By: Linton Ham MD Entered By: Linton Ham on 07/29/2021 15:19:44 Shane Bean ([id number]) -------------------------------------------------------------------------------- SuperBill Details Patient Name: Shane Bean Date of Service: 07/29/2021 Medical Record Number: [id number] Patient Account Number: [id number] Date of Birth/Sex: May 10, 1955 (65 y.o. M) Treating RN: Cornell Barman Primary Care Provider: Reeves Dam Other Clinician: Massie Kluver Referring Provider: Reeves Dam Treating Provider/Extender: Tito Dine in Treatment: 16 Diagnosis Coding ICD-10 Codes Code Description E11.622 Type 2 diabetes mellitus with other skin ulcer I87.2 Venous insufficiency (chronic) (peripheral) 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 layer exposed L97.812 Non-pressure chronic ulcer of other part of right lower leg with fat layer exposed I10 Essential (primary) hypertension Facility Procedures CPT4 Code: 09323557 Description: [phone] - WOUND CARE VISIT-LEV 2 EST PT Modifier: Quantity: 1 Physician Procedures CPT4 Code: 5427062 Description: 37628 - WC PHYS LEVEL 2 - EST PT Modifier: Quantity: 1 CPT4 Code:  Description: ICD-10 Diagnosis Description L97.812 Non-pressure chronic ulcer of other part of right lower leg with fat la I87.2 Venous insufficiency (chronic) (peripheral) Modifier: yer exposed Quantity: Electronic Signature(s) Signed: 07/29/2021 4:27:18 PM By: Linton Ham MD Entered By: Linton Ham on 07/29/2021 15:20:01

## 2021-07-30 NOTE — Progress Notes (Signed)
[name], [name] ([id number]) Visit Report for 07/29/2021 Arrival Information Details Patient Name: Shane Bean, Shane Bean Date of Service: 07/29/2021 2:30 PM Medical Record Number: [id number] Patient Account Number: [id number] Date of Birth/Sex: [birth year] (66 y.o. M) Treating RN: Shane Bean Primary Care Shane Bean: Shane Bean Other Clinician: Massie Bean Referring Shane Bean: Shane Bean Treating Soua Lenk/Extender: Shane Bean in Treatment: 46 Visit Information History Since Last Visit All ordered tests and consults were completed: No Patient Arrived: Ambulatory Added or deleted any medications: No Arrival Time: 14:46 Any new allergies or adverse reactions: No Transfer Assistance: None Had a fall or experienced change in No Patient Requires Transmission-Based No activities of daily living that may affect Precautions: risk of falls: Patient Has Alerts: Yes Hospitalized since last visit: No Patient Alerts: Patient on Blood Pain Present Now: No Thinner ABI R [place] TBI .90 7/22 ABI L 1.46 TBI .76 7/22 Electronic Signature(s) Signed: 07/29/2021 4:20:19 PM By: Shane Bean Entered By: Shane Bean on 07/29/2021 14:51:15 Shane Bean ([id number]) -------------------------------------------------------------------------------- Clinic Level of Care Assessment Details Patient Name: Shane Bean Date of Service: 07/29/2021 2:30 PM Medical Record Number: [id number] Patient Account Number: [id number] Date of Birth/Sex: May 15, 1955 (66 y.o. M) Treating RN: Shane Bean Primary Care Michaele Amundson: Shane Bean Other Clinician: Massie Bean Referring Yasaman Kolek: Shane Bean Treating Shane Bean/Extender: Shane Bean in Treatment: 16 Clinic Level of Care Assessment Items TOOL 4 Quantity Score '[]'$  - Use when only an EandM is performed on FOLLOW-UP visit 0 ASSESSMENTS - Nursing Assessment / Reassessment X - Reassessment of Co-morbidities (includes updates  in patient status) 1 10 X- 1 5 Reassessment of Adherence to Treatment Plan ASSESSMENTS - Wound and Skin Assessment / Reassessment X - Simple Wound Assessment / Reassessment - one wound 1 5 '[]'$  - 0 Complex Wound Assessment / Reassessment - multiple wounds '[]'$  - 0 Dermatologic / Skin Assessment (not related to wound area) ASSESSMENTS - Focused Assessment '[]'$  - Circumferential Edema Measurements - multi extremities 0 '[]'$  - 0 Nutritional Assessment / Counseling / Intervention '[]'$  - 0 Lower Extremity Assessment (monofilament, tuning fork, pulses) '[]'$  - 0 Peripheral Arterial Disease Assessment (using hand held doppler) ASSESSMENTS - Ostomy and/or Continence Assessment and Care '[]'$  - Incontinence Assessment and Management 0 '[]'$  - 0 Ostomy Care Assessment and Management (repouching, etc.) PROCESS - Coordination of Care X - Simple Patient / Family Education for ongoing care 1 15 '[]'$  - 0 Complex (extensive) Patient / Family Education for ongoing care '[]'$  - 0 Staff obtains Programmer, systems, Records, Test Results / Process Orders '[]'$  - 0 Staff telephones HHA, Nursing Homes / Clarify orders / etc '[]'$  - 0 Routine Transfer to another Facility (non-emergent condition) '[]'$  - 0 Routine Hospital Admission (non-emergent condition) '[]'$  - 0 New Admissions / Biomedical engineer / Ordering NPWT, Apligraf, etc. '[]'$  - 0 Emergency Hospital Admission (emergent condition) X- 1 10 Simple Discharge Coordination '[]'$  - 0 Complex (extensive) Discharge Coordination PROCESS - Special Needs '[]'$  - Pediatric / Minor Patient Management 0 '[]'$  - 0 Isolation Patient Management '[]'$  - 0 Hearing / Language / Visual special needs '[]'$  - 0 Assessment of Community assistance (transportation, D/C planning, etc.) '[]'$  - 0 Additional assistance / Altered mentation '[]'$  - 0 Support Surface(s) Assessment (bed, cushion, seat, etc.) INTERVENTIONS - Wound Cleansing / Measurement Shane Bean, Shane V. ([id number]) X- 1 5 Simple Wound Cleansing -  one wound '[]'$  - 0 Complex Wound Cleansing - multiple wounds X- 1 5 Wound Imaging (photographs - any number of wounds) '[]'$  - 0  Wound Tracing (instead of photographs) '[]'$  - 0 Simple Wound Measurement - one wound '[]'$  - 0 Complex Wound Measurement - multiple wounds INTERVENTIONS - Wound Dressings X - Small Wound Dressing one or multiple wounds 1 10 '[]'$  - 0 Medium Wound Dressing one or multiple wounds '[]'$  - 0 Large Wound Dressing one or multiple wounds '[]'$  - 0 Application of Medications - topical '[]'$  - 0 Application of Medications - injection INTERVENTIONS - Miscellaneous '[]'$  - External ear exam 0 '[]'$  - 0 Specimen Collection (cultures, biopsies, blood, body fluids, etc.) '[]'$  - 0 Specimen(s) / Culture(s) sent or taken to Lab for analysis '[]'$  - 0 Patient Transfer (multiple staff / Civil Service fast streamer / Similar devices) '[]'$  - 0 Simple Staple / Suture removal (25 or less) '[]'$  - 0 Complex Staple / Suture removal (26 or more) '[]'$  - 0 Hypo / Hyperglycemic Management (close monitor of Blood Glucose) '[]'$  - 0 Ankle / Brachial Index (ABI) - do not check if billed separately X- 1 5 Vital Signs Has the patient been seen at the hospital within the last three years: Yes Total Score: 70 Level Of Care: New/Established - Level 2 Electronic Signature(s) Signed: 07/29/2021 4:20:19 PM By: Shane Bean Entered By: Shane Bean on 07/29/2021 15:16:09 Shane Bean ([id number]) -------------------------------------------------------------------------------- Encounter Discharge Information Details Patient Name: Shane Bean Date of Service: 07/29/2021 2:30 PM Medical Record Number: [id number] Patient Account Number: [id number] Date of Birth/Sex: [birth year] (66 y.o. M) Treating RN: Shane Bean Primary Care Shane Bean: Shane Bean Other Clinician: Massie Bean Referring Shane Bean: Shane Bean Treating Shane Bean/Extender: Shane Bean in Treatment: 16 Encounter Discharge Information  Items Discharge Condition: Stable Ambulatory Status: Ambulatory Discharge Destination: Home Transportation: Private Auto Accompanied By: self Schedule Follow-up Appointment: Yes Clinical Summary of Care: Electronic Signature(s) Signed: 07/29/2021 4:20:19 PM By: Shane Bean Entered By: Shane Bean on 07/29/2021 15:22:32 Shane Bean ([id number]) -------------------------------------------------------------------------------- Lower Extremity Assessment Details Patient Name: Shane Bean Date of Service: 07/29/2021 2:30 PM Medical Record Number: [id number] Patient Account Number: [id number] Date of Birth/Sex: 06/28/1955 (65 y.o. M) Treating RN: Shane Bean Primary Care Ardie Dragoo: Shane Bean Other Clinician: Massie Bean Referring Earsie Humm: Shane Bean Treating Ismahan Lippman/Extender: Shane Bean in Treatment: 16 Edema Assessment Assessed: [Left: No] [Right: Yes] Edema: [Left: N] [Right: o] Calf Left: Right: Point of Measurement: 34 cm From Medial Instep 39.9 cm Ankle Left: Right: Point of Measurement: 11 cm From Medial Instep 24.9 cm Vascular Assessment Pulses: Dorsalis Pedis Palpable: [Right:Yes] Electronic Signature(s) Signed: 07/29/2021 4:20:19 PM By: Shane Bean Signed: 07/30/2021 3:58:02 PM By: Gretta Cool, BSN, RN, CWS, Kim RN, BSN Entered By: Shane Bean on 07/29/2021 15:23:33 Shane Bean ([id number]) -------------------------------------------------------------------------------- Multi Wound Chart Details Patient Name: Shane Bean Date of Service: 07/29/2021 2:30 PM Medical Record Number: [id number] Patient Account Number: [id number] Date of Birth/Sex: [birth year] (65 y.o. M) Treating RN: Shane Bean Primary Care Jerrik Housholder: Shane Bean Other Clinician: Massie Bean Referring Jahnai Slingerland: Shane Bean Treating Freya Zobrist/Extender: Shane Bean in Treatment: 16 Vital Signs Height(in): 70 Pulse(bpm):  37 Weight(lbs): 277 Blood Pressure(mmHg): 127/70 Body Mass Index(BMI): 39.7 Temperature(F): 98.3 Respiratory Rate(breaths/min): 18 Photos: [N/A:N/A] Wound Location: Right, Lateral, Circumferential N/A N/A Lower Leg Wounding Event: Gradually Appeared N/A N/A Primary Etiology: Diabetic Wound/Ulcer of the Lower N/A N/A Extremity Comorbid History: Hypertension, Type II Diabetes, N/A N/A Neuropathy Date Acquired: 02/03/2021 N/A N/A Weeks of Treatment: 16 N/A N/A Wound Status: Open N/A N/A Wound Recurrence: No N/A N/A Measurements L x W x D (cm) 0.1x0.1x0.1  N/A N/A Area (cm) : 0.008 N/A N/A Volume (cm) : 0.001 N/A N/A % Reduction in Area: 100.00% N/A N/A % Reduction in Volume: 100.00% N/A N/A Classification: Grade 2 N/A N/A Exudate Amount: Medium N/A N/A Exudate Type: Serosanguineous N/A N/A Exudate Color: red, brown N/A N/A Granulation Amount: Medium (34-66%) N/A N/A Granulation Quality: Red N/A N/A Necrotic Amount: None Present (0%) N/A N/A Exposed Structures: Fat Layer (Subcutaneous Tissue): N/A N/A Yes Fascia: No Tendon: No Muscle: No Joint: No Bone: No Epithelialization: Medium (34-66%) N/A N/A Shane Bean, Shane Bean ([id number]) Treatment Notes Electronic Signature(s) Signed: 07/29/2021 4:20:19 PM By: Shane Bean Entered By: Shane Bean on 07/29/2021 15:06:08 Shane Bean ([id number]) -------------------------------------------------------------------------------- [place] Details Patient Name: Shane Bean Date of Service: 07/29/2021 2:30 PM Medical Record Number: [id number] Patient Account Number: [id number] Date of Birth/Sex: January 02, 1956 (65 y.o. M) Treating RN: Shane Bean Primary Care Shontay Wallner: Shane Bean Other Clinician: Massie Bean Referring Sansa Alkema: Shane Bean Treating Fran Mcree/Extender: Shane Bean in Treatment: 16 Active Inactive Electronic Signature(s) Signed: 07/29/2021 4:20:19 PM By: Shane Bean Signed: 07/30/2021 3:58:02 PM By: Gretta Cool, BSN, RN, CWS, Kim RN, BSN Entered By: Shane Bean on 07/29/2021 15:23:50 Shane Bean ([id number]) -------------------------------------------------------------------------------- Pain Assessment Details Patient Name: Shane Bean Date of Service: 07/29/2021 2:30 PM Medical Record Number: [id number] Patient Account Number: [id number] Date of Birth/Sex: 07-29-1955 (65 y.o. M) Treating RN: Shane Bean Primary Care Doll Frazee: Shane Bean Other Clinician: Massie Bean Referring Shane Bean: Shane Bean Treating Zaliyah Meikle/Extender: Shane Bean in Treatment: 16 Active Problems Location of Pain Severity and Description of Pain Patient Has Paino No Site Locations Pain Management and Medication Current Pain Management: Electronic Signature(s) Signed: 07/29/2021 4:20:19 PM By: Shane Bean Signed: 07/30/2021 3:58:02 PM By: Gretta Cool, BSN, RN, CWS, Kim RN, BSN Entered By: Shane Bean on 07/29/2021 14:53:32 Shane Bean ([id number]) -------------------------------------------------------------------------------- Patient/Caregiver Education Details Patient Name: Shane Bean Date of Service: 07/29/2021 2:30 PM Medical Record Number: [id number] Patient Account Number: [id number] Date of Birth/Gender: 02/11/1955 (65 y.o. M) Treating RN: Shane Bean Primary Care Physician: Shane Bean Other Clinician: Massie Bean Referring Physician: Reeves Bean Treating Physician/Extender: Shane Bean in Treatment: 16 Education Assessment Education Provided To: Patient Education Topics Provided Wound/Skin Impairment: Handouts: Other: wound healed, call office if any further issues arise Methods: Explain/Verbal Responses: State content correctly Electronic Signature(s) Signed: 07/29/2021 4:20:19 PM By: Shane Bean Entered By: Shane Bean on 07/29/2021 Shane Bean, Shane V.  ([id number]) -------------------------------------------------------------------------------- Wound Assessment Details Patient Name: Shane Bean Date of Service: 07/29/2021 2:30 PM Medical Record Number: [id number] Patient Account Number: [id number] Date of Birth/Sex: July 11, 1955 (65 y.o. M) Treating RN: Shane Bean Primary Care Amun Stemm: Shane Bean Other Clinician: Massie Bean Referring Shane Bean: Shane Bean Treating Sandy Haye/Extender: Shane Bean in Treatment: 16 Wound Status Wound Number: 3 Primary Etiology: Diabetic Wound/Ulcer of the Lower Extremity Wound Location: Right, Lateral, Circumferential Lower Leg Wound Status: Healed - Epithelialized Wounding Event: Gradually Appeared Comorbid History: Hypertension, Type II Diabetes, Neuropathy Date Acquired: 02/03/2021 Weeks Of Treatment: 16 Clustered Wound: No Photos Wound Measurements Length: (cm) Width: (cm) Depth: (cm) Area: (cm) Volume: (cm) 0 % Reduction in Area: 100% 0 % Reduction in Volume: 100% 0 Epithelialization: Medium (34-66%) 0 0 Wound Description Classification: Grade 2 Exudate Amount: Medium Exudate Type: Serosanguineous Exudate Color: red, brown Foul Odor After Cleansing: No Slough/Fibrino Yes Wound Bed Granulation Amount: Medium (34-66%) Exposed Structure Granulation Quality: Red Fascia Exposed: No Necrotic Amount: None Present (0%) Fat  Layer (Subcutaneous Tissue) Exposed: Yes Tendon Exposed: No Muscle Exposed: No Joint Exposed: No Bone Exposed: No Treatment Notes Wound #3 (Lower Leg) Wound Laterality: Right, Lateral, Circumferential Cleanser Peri-Wound Care Topical Primary Dressing Shane Bean, Shane Bean ([id number]) Secondary Dressing Secured With Compression Wrap Compression Stockings Add-Ons Electronic Signature(s) Signed: 07/29/2021 4:20:19 PM By: Shane Bean Signed: 07/30/2021 3:58:02 PM By: Gretta Cool, BSN, RN, CWS, Kim RN, BSN Entered By: Shane Bean on  07/29/2021 15:13:26 Shane Bean ([id number]) -------------------------------------------------------------------------------- [place] Details Patient Name: Shane Bean Date of Service: 07/29/2021 2:30 PM Medical Record Number: [id number] Patient Account Number: [id number] Date of Birth/Sex: 1955/03/07 (65 y.o. M) Treating RN: Shane Bean Primary Care Vandy Fong: Shane Bean Other Clinician: Massie Bean Referring Jaqwan Wieber: Shane Bean Treating Keitra Carusone/Extender: Shane Bean in Treatment: 16 Vital Signs Time Taken: 14:51 Temperature (F): 98.3 Height (in): 70 Pulse (bpm): 64 Weight (lbs): 277 Respiratory Rate (breaths/min): 18 Body Mass Index (BMI): 39.7 Blood Pressure (mmHg): 127/70 Reference Range: 80 - 120 mg / dl Electronic Signature(s) Signed: 07/29/2021 4:20:19 PM By: Shane Bean Entered By: Shane Bean on 07/29/2021 14:53:25

## 2021-08-05 ENCOUNTER — Ambulatory Visit: Payer: Medicare (Managed Care) | Admitting: Physician Assistant

## 2021-08-05 ENCOUNTER — Encounter: Payer: Medicare (Managed Care) | Attending: Physician Assistant | Admitting: Physician Assistant

## 2021-08-05 DIAGNOSIS — E11622 Type 2 diabetes mellitus with other skin ulcer: Secondary | ICD-10-CM | POA: Insufficient documentation

## 2021-08-05 DIAGNOSIS — L97812 Non-pressure chronic ulcer of other part of right lower leg with fat layer exposed: Secondary | ICD-10-CM | POA: Insufficient documentation

## 2021-08-05 DIAGNOSIS — E1142 Type 2 diabetes mellitus with diabetic polyneuropathy: Secondary | ICD-10-CM | POA: Diagnosis not present

## 2021-08-05 DIAGNOSIS — I872 Venous insufficiency (chronic) (peripheral): Secondary | ICD-10-CM | POA: Diagnosis not present

## 2021-08-05 DIAGNOSIS — L97822 Non-pressure chronic ulcer of other part of left lower leg with fat layer exposed: Secondary | ICD-10-CM | POA: Diagnosis not present

## 2021-08-05 DIAGNOSIS — I10 Essential (primary) hypertension: Secondary | ICD-10-CM | POA: Diagnosis not present

## 2021-08-05 NOTE — Progress Notes (Signed)
Shane Bean, Shane Bean ([id number]) Visit Report for 08/05/2021 Arrival Information Details Patient Name: Shane Bean, Shane Bean Date of Service: 08/05/2021 9:15 AM Medical Record Number: [id number] Patient Account Number: [id number] Date of Birth/Sex: 01/11/1955 (66 y.o. M) Treating RN: Levora Dredge Primary Care Demorio Seeley: Reeves Dam Other Clinician: Referring Louella Medaglia: Reeves Dam Treating Ruth Tully/Extender: Skipper Cliche in Treatment: 17 Visit Information History Since Last Visit Added or deleted any medications: No Patient Arrived: Ambulatory Any new allergies or adverse reactions: No Arrival Time: 09:17 Had a fall or experienced change in No Accompanied By: self activities of daily living that may affect Transfer Assistance: None risk of falls: Patient Identification Verified: Yes Hospitalized since last visit: No Secondary Verification Process Completed: Yes Has Dressing in Place as Prescribed: Yes Patient Requires Transmission-Based No Pain Present Now: No Precautions: Patient Has Alerts: Yes Patient Alerts: Patient on Blood Thinner ABI R Harper TBI .90 7/22 ABI L 1.46 TBI .76 7/22 Electronic Signature(s) Signed: 08/05/2021 4:10:56 PM By: Levora Dredge Entered By: Levora Dredge on 08/05/2021 09:17:29 Shane Bean ([id number]) -------------------------------------------------------------------------------- Clinic Level of Care Assessment Details Patient Name: Shane Bean Date of Service: 08/05/2021 9:15 AM Medical Record Number: [id number] Patient Account Number: [id number] Date of Birth/Sex: 1955/10/31 (66 y.o. M) Treating RN: Levora Dredge Primary Care Aviyah Swetz: Reeves Dam Other Clinician: Referring Caison Hearn: Reeves Dam Treating Alanii Ramer/Extender: Skipper Cliche in Treatment: 17 Clinic Level of Care Assessment Items TOOL 1 Quantity Score '[]'$  - Use when EandM and Procedure is performed on INITIAL visit 0 ASSESSMENTS - Nursing Assessment /  Reassessment '[]'$  - General Physical Exam (combine w/ comprehensive assessment (listed just below) when performed on new 0 pt. evals) '[]'$  - 0 Comprehensive Assessment (HX, ROS, Risk Assessments, Wounds Hx, etc.) ASSESSMENTS - Wound and Skin Assessment / Reassessment '[]'$  - Dermatologic / Skin Assessment (not related to wound area) 0 ASSESSMENTS - Ostomy and/or Continence Assessment and Care '[]'$  - Incontinence Assessment and Management 0 '[]'$  - 0 Ostomy Care Assessment and Management (repouching, etc.) PROCESS - Coordination of Care '[]'$  - Simple Patient / Family Education for ongoing care 0 '[]'$  - 0 Complex (extensive) Patient / Family Education for ongoing care '[]'$  - 0 Staff obtains Programmer, systems, Records, Test Results / Process Orders '[]'$  - 0 Staff telephones HHA, Nursing Homes / Clarify orders / etc '[]'$  - 0 Routine Transfer to another Facility (non-emergent condition) '[]'$  - 0 Routine Hospital Admission (non-emergent condition) '[]'$  - 0 New Admissions / Biomedical engineer / Ordering NPWT, Apligraf, etc. '[]'$  - 0 Emergency Hospital Admission (emergent condition) PROCESS - Special Needs '[]'$  - Pediatric / Minor Patient Management 0 '[]'$  - 0 Isolation Patient Management '[]'$  - 0 Hearing / Language / Visual special needs '[]'$  - 0 Assessment of Community assistance (transportation, D/C planning, etc.) '[]'$  - 0 Additional assistance / Altered mentation '[]'$  - 0 Support Surface(s) Assessment (bed, cushion, seat, etc.) INTERVENTIONS - Miscellaneous '[]'$  - External ear exam 0 '[]'$  - 0 Patient Transfer (multiple staff / Civil Service fast streamer / Similar devices) '[]'$  - 0 Simple Staple / Suture removal (25 or less) '[]'$  - 0 Complex Staple / Suture removal (26 or more) '[]'$  - 0 Hypo/Hyperglycemic Management (do not check if billed separately) '[]'$  - 0 Ankle / Brachial Index (ABI) - do not check if billed separately Has the patient been seen at the hospital within the last three years: Yes Total Score: 0 Level Of Care:  ____ Shane Bean ([id number]) Electronic Signature(s) Signed: 08/05/2021 4:10:56 PM By: Levora Dredge Entered By: Araceli Bouche,  Caitlin on 08/05/2021 10:45:00 Shane Bean, Shane Bean ([id number]) -------------------------------------------------------------------------------- Compression Therapy Details Patient Name: Shane Bean, Shane Bean Date of Service: 08/05/2021 9:15 AM Medical Record Number: [id number] Patient Account Number: [id number] Date of Birth/Sex: Dec 04, 1955 (66 y.o. M) Treating RN: Levora Dredge Primary Care Malike Foglio: Reeves Dam Other Clinician: Referring Leonardo Makris: Reeves Dam Treating Kashawn Manzano/Extender: Jeri Cos Weeks in Treatment: 17 Compression Therapy Performed for Wound Assessment: Wound #4 Right,Medial Lower Leg Performed By: Clinician Levora Dredge, RN Compression Type: Three Layer Post Procedure Diagnosis Same as Pre-procedure Notes pt tolerated wrap without issue Electronic Signature(s) Signed: 08/05/2021 10:43:58 AM By: Levora Dredge Entered By: Levora Dredge on 08/05/2021 10:43:58 Shane Bean ([id number]) -------------------------------------------------------------------------------- Encounter Discharge Information Details Patient Name: Shane Bean Date of Service: 08/05/2021 9:15 AM Medical Record Number: [id number] Patient Account Number: [id number] Date of Birth/Sex: Jun 12, 1955 (66 y.o. M) Treating RN: Levora Dredge Primary Care Teren Zurcher: Reeves Dam Other Clinician: Referring Maeven Mcdougall: Reeves Dam Treating Shivonne Schwartzman/Extender: Skipper Cliche in Treatment: 17 Encounter Discharge Information Items Discharge Condition: Stable Ambulatory Status: Ambulatory Discharge Destination: Home Transportation: Private Auto Accompanied By: self Schedule Follow-up Appointment: Yes Clinical Summary of Care: Electronic Signature(s) Signed: 08/05/2021 10:48:08 AM By: Levora Dredge Entered By: Levora Dredge on 08/05/2021  10:48:08 Shane Bean ([id number]) -------------------------------------------------------------------------------- Lower Extremity Assessment Details Patient Name: Shane Bean Date of Service: 08/05/2021 9:15 AM Medical Record Number: [id number] Patient Account Number: [id number] Date of Birth/Sex: [birth year] (65 y.o. M) Treating RN: Levora Dredge Primary Care Rees Santistevan: Reeves Dam Other Clinician: Referring Castor Gittleman: Reeves Dam Treating Camryn Lampson/Extender: Jeri Cos Weeks in Treatment: 17 Edema Assessment Assessed: [Left: No] [Right: No] Edema: [Left: Ye] [Right: s] Calf Left: Right: Point of Measurement: 34 cm From Medial Instep 41.4 cm Ankle Left: Right: Point of Measurement: 11 cm From Medial Instep 27.5 cm Knee To Floor Left: Right: From Medial Instep 42 cm Vascular Assessment Pulses: Dorsalis Pedis Palpable: [Right:Yes] Electronic Signature(s) Signed: 08/05/2021 4:10:56 PM By: Levora Dredge Entered By: Levora Dredge on 08/05/2021 09:24:49 Shane Bean ([id number]) -------------------------------------------------------------------------------- Multi Wound Chart Details Patient Name: Shane Bean Date of Service: 08/05/2021 9:15 AM Medical Record Number: [id number] Patient Account Number: [id number] Date of Birth/Sex: 29-Jul-1955 (65 y.o. M) Treating RN: Levora Dredge Primary Care Kyriaki Moder: Reeves Dam Other Clinician: Referring Quierra Silverio: Reeves Dam Treating Knolan Simien/Extender: Skipper Cliche in Treatment: 17 Vital Signs Height(in): 70 Pulse(bpm): 84 Weight(lbs): 466 Blood Pressure(mmHg): 160/75 Body Mass Index(BMI): 39.7 Temperature(F): 98.2 Respiratory Rate(breaths/min): 18 Photos: [N/A:N/A] Wound Location: Right, Medial Lower Leg N/A N/A Wounding Event: Blister N/A N/A Primary Etiology: Diabetic Wound/Ulcer of the Lower N/A N/A Extremity Comorbid History: Hypertension, Type II Diabetes, N/A N/A Neuropathy Date  Acquired: 07/30/2021 N/A N/A Weeks of Treatment: 0 N/A N/A Wound Status: Open N/A N/A Wound Recurrence: No N/A N/A Measurements L x W x D (cm) 2.1x1.9x0.1 N/A N/A Area (cm) : 3.134 N/A N/A Volume (cm) : 0.313 N/A N/A Classification: Grade 1 N/A N/A Exudate Amount: Medium N/A N/A Exudate Type: Serosanguineous N/A N/A Exudate Color: red, brown N/A N/A Granulation Amount: Large (67-100%) N/A N/A Granulation Quality: Red N/A N/A Necrotic Amount: None Present (0%) N/A N/A Exposed Structures: Fat Layer (Subcutaneous Tissue): N/A N/A Yes Epithelialization: None N/A N/A Treatment Notes Electronic Signature(s) Signed: 08/05/2021 4:10:56 PM By: Levora Dredge Entered By: Levora Dredge on 08/05/2021 09:27:08 Shane Bean ([id number]) -------------------------------------------------------------------------------- [place] Details Patient Name: Shane Bean Date of Service: 08/05/2021 9:15 AM Medical Record Number: [id number] Patient Account Number: [id number] Date of Birth/Sex: 1955/04/03 (65  y.o. M) Treating RN: Levora Dredge Primary Care Terecia Plaut: Reeves Dam Other Clinician: Referring Riven Mabile: Reeves Dam Treating Theoplis Garciagarcia/Extender: Jeri Cos Weeks in Treatment: 17 Active Inactive Electronic Signature(s) Signed: 08/05/2021 4:10:56 PM By: Levora Dredge Entered By: Levora Dredge on 08/05/2021 09:27:01 Shane Bean ([id number]) -------------------------------------------------------------------------------- Pain Assessment Details Patient Name: Shane Bean Date of Service: 08/05/2021 9:15 AM Medical Record Number: [id number] Patient Account Number: [id number] Date of Birth/Sex: 07/21/1955 (65 y.o. M) Treating RN: Levora Dredge Primary Care Arvle Grabe: Reeves Dam Other Clinician: Referring Conway Fedora: Reeves Dam Treating Ajee Heasley/Extender: Skipper Cliche in Treatment: 17 Active Problems Location of Pain Severity and  Description of Pain Patient Has Paino No Site Locations Rate the pain. Current Pain Level: 0 Pain Management and Medication Current Pain Management: Electronic Signature(s) Signed: 08/05/2021 4:10:56 PM By: Levora Dredge Entered By: Levora Dredge on 08/05/2021 09:19:06 Shane Bean ([id number]) -------------------------------------------------------------------------------- Patient/Caregiver Education Details Patient Name: Shane Bean Date of Service: 08/05/2021 9:15 AM Medical Record Number: [id number] Patient Account Number: [id number] Date of Birth/Gender: 02/07/1955 (65 y.o. M) Treating RN: Levora Dredge Primary Care Physician: Reeves Dam Other Clinician: Referring Physician: Reeves Dam Treating Physician/Extender: Skipper Cliche in Treatment: 17 Education Assessment Education Provided To: Patient Education Topics Provided Wound/Skin Impairment: Handouts: Caring for Your Ulcer Methods: Explain/Verbal Responses: State content correctly Electronic Signature(s) Signed: 08/05/2021 4:10:56 PM By: Levora Dredge Entered By: Levora Dredge on 08/05/2021 10:47:11 Shane Bean ([id number]) -------------------------------------------------------------------------------- Wound Assessment Details Patient Name: Shane Bean Date of Service: 08/05/2021 9:15 AM Medical Record Number: [id number] Patient Account Number: [id number] Date of Birth/Sex: Apr 17, 1955 (65 y.o. M) Treating RN: Levora Dredge Primary Care Rosali Augello: Reeves Dam Other Clinician: Referring Julies Carmickle: Reeves Dam Treating Emilyn Ruble/Extender: Jeri Cos Weeks in Treatment: 17 Wound Status Wound Number: 4 Primary Etiology: Diabetic Wound/Ulcer of the Lower Extremity Wound Location: Right, Medial Lower Leg Wound Status: Open Wounding Event: Blister Comorbid History: Hypertension, Type II Diabetes, Neuropathy Date Acquired: 07/30/2021 Weeks Of Treatment: 0 Clustered Wound:  No Photos Wound Measurements Length: (cm) 2.1 Width: (cm) 1.9 Depth: (cm) 0.1 Area: (cm) 3.134 Volume: (cm) 0.313 % Reduction in Area: % Reduction in Volume: Epithelialization: None Tunneling: No Undermining: No Wound Description Classification: Grade 1 Exudate Amount: Medium Exudate Type: Serosanguineous Exudate Color: red, brown Foul Odor After Cleansing: No Slough/Fibrino Yes Wound Bed Granulation Amount: Large (67-100%) Exposed Structure Granulation Quality: Red Fat Layer (Subcutaneous Tissue) Exposed: Yes Necrotic Amount: None Present (0%) Treatment Notes Wound #4 (Lower Leg) Wound Laterality: Right, Medial Cleanser Soap and Water Discharge Instruction: Gently cleanse wound with antibacterial soap, rinse and pat dry prior to dressing wounds Peri-Wound Care Topical Primary Dressing Silvercel 4 1/4x 4 1/4 (in/in) Kleinman, Jaggar V. ([id number]) Discharge Instruction: Apply Silvercel 4 1/4x 4 1/4 (in/in) as instructed Secondary Dressing ABD Pad 5x9 (in/in) Discharge Instruction: Cover with ABD pad Secured With Compression Wrap 3-LAYER WRAP - Profore Lite LF 3 Multilayer Compression Bandaging System Discharge Instruction: Apply 3 multi-layer wrap as prescribed. Compression Stockings Add-Ons Electronic Signature(s) Signed: 08/05/2021 4:10:56 PM By: Levora Dredge Entered By: Levora Dredge on 08/05/2021 09:26:30 Shane Bean ([id number]) -------------------------------------------------------------------------------- Vitals Details Patient Name: Shane Bean Date of Service: 08/05/2021 9:15 AM Medical Record Number: [id number] Patient Account Number: [id number] Date of Birth/Sex: 1955/08/10 (65 y.o. M) Treating RN: Levora Dredge Primary Care Darey Hershberger: Reeves Dam Other Clinician: Referring Malayja Freund: Reeves Dam Treating Nimrit Kehres/Extender: Skipper Cliche in Treatment: 17 Vital Signs Time Taken: 09:17 Temperature (F): 98.2 Height  (in): 70 Pulse (bpm): 84 Weight (  lbs): 277 Respiratory Rate (breaths/min): 18 Body Mass Index (BMI): 39.7 Blood Pressure (mmHg): 160/75 Reference Range: 80 - 120 mg / dl Electronic Signature(s) Signed: 08/05/2021 4:10:56 PM By: Levora Dredge Entered By: Levora Dredge on 08/05/2021 09:18:59

## 2021-08-05 NOTE — Progress Notes (Addendum)
ION, GONNELLA ([id number]) Visit Report for 08/05/2021 Chief Complaint Document Details Patient Name: Shane Bean, Shane Bean Date of Service: 08/05/2021 9:15 AM Medical Record Number: [id number] Patient Account Number: [id number] Date of Birth/Sex: Sep 22, 1955 (66 y.o. M) Treating RN: Levora Dredge Primary Care Provider: Reeves Dam Other Clinician: Referring Provider: Reeves Dam Treating Provider/Extender: Skipper Cliche in Treatment: 17 Information Obtained from: Patient Chief Complaint Right LE Ulcer Electronic Signature(s) Signed: 08/05/2021 9:49:39 AM By: Worthy Keeler PA-C Entered By: Worthy Keeler on 08/05/2021 09:49:38 Shane Bean ([id number]) -------------------------------------------------------------------------------- HPI Details Patient Name: Shane Bean Date of Service: 08/05/2021 9:15 AM Medical Record Number: [id number] Patient Account Number: [id number] Date of Birth/Sex: 1955-11-26 (65 y.o. M) Treating RN: Levora Dredge Primary Care Provider: Reeves Dam Other Clinician: Referring Provider: Reeves Dam Treating Provider/Extender: Skipper Cliche in Treatment: 17 History of Present Illness HPI Description: 05/18/2020 upon evaluation today patient appears for initial evaluation here in clinic concerning issues that he has been having with the wound on his left lateral leg. Fortunately there does not appear to be any signs of obvious an active infection at this time which is great news. With that being said the patient unfortunately is continued to have issues with pain although he tells me it is gotten a lot better. He was on Keflex as well as Bactrim DS which seems to have done a good job there. His most recent hemoglobin A1c was 9.8 that was on 04/14/2020. Subsequently I do feel like that the patient is making progress here. He does have a history of chronic venous insufficiency as well as hypertension. I do not see any need for  antibiotics at this point. 5/25; follow-up of the wound on the left posterior calf. This is completely necrotic on the surface. It does not look infected but it is painful. He is a diabetic but I think there is some suggestion here of chronic venous disease as well. We used Iodoflex under compression last week 6/1; again a completely necrotic surface on this with the wound. We have been using Iodoflex under compression he complains of gnawing pain. He has had wounds previously a lot of this looks like chronic venous disease. 6/8; about two thirds of the surface of this wound is still covered in a black necrotic eschar. I changed him from Iodoflex to [place] last week because of complaints of pain. He actually was seen by her neurologist this weeko Peripheral neuropathy as a cause of the pain and they changed him from Neurontin to Lyrica but he still has not had any relief. He says that most of the pain however is in his heel not in his wound per se. He is a diabetic 6/15. This is a patient with what looks to be a venous wound on the left lateral lower leg. He is also a diabetic. Currently being worked up for diabetic neuropathy. He complains of pain out of proportion to the size of the wound although to be fair the entire area here was eschared. We have been working to get a viable surface. We are using Sorbact 6/22; fairly painful wound on the left posterior calf. I am assuming this is been venous he is also a diabetic. His ABI in our clinic was noncompressible however he has easily palpable pulses on his feet. He complains of a lot of pain in the wound also of the left heel and the upper left calf. Some of this may be neuropathy. It is led me to discontinue  Iodoflex reduce his compression but he still complains of pain in fact he says he could not take a debridement today. When I first saw this it was completely necrotic surface we have got it down to something that looks a reasonably healthy I  have been wondering about biopsying this area however he is on Coumadin and with the pain I have put this off. The major question would be an inflammatory ulcer such as pyoderma. The patient is not aware of how this started. He does however have a wound history on both legs. 6/29; patient presents for 1 week follow-up. He has been using Hydrofera Blue under Kerlix/Coban. He has tolerated this well. He currently denies signs of infection. He also declines debridement today. He he states it is painful and does not want to have this done. 7/6; Patient presents for 1 week follow-up. He has been using Hydrofera Blue under Kerlix/Coban. He denies signs of infection. He declines debridement today. 7/15; patient presents for 1 week follow-up. He has been using Hydrofera Blue under Kerlix/Coban. He denies signs of infection today. He is agreeable to having debridement done today. 7/20; patient presents for 1 week follow-up. He is in good spirits today. He has been using Hydrofera Blue under Kerlix/Coban. He denies signs of infection today. 7/27; patient presents for 1 week follow-up. He is in good spirits today. He has no issues or complaints today. He denies signs of infection. 08/12/2020 upon evaluation today patient appears to be doing better in regard to his wound. He has been tolerating the dressing changes without complication. With that being said he did have arterial studies and does show that he has a TBI on the right of 0.90 and a TBI on the left of 0.76 which is excellent. There is no evidence of arterial compromise at this point. 8/17; patient presents for 1 week follow-up. He has no issues or complaints today. He denies signs of infection. 8/24; patient presents for 1 week follow-up. He has no issues or complaints today. He denies signs of infection. He continues to tolerate the compression wrap well. 8/31; patient presents for 1 week follow-up. He has no issues or complaints today. He denies  signs of infection. He is in good spirits today. 9/7; patient presents for 1 week follow-up. He is tolerated the compression wrap well. He has no issues or complaints today. He denies signs of infection. 9/14; patient presents for 1 week follow-up. He has no issues or complaints today. He denies signs of infection. He is in excellent spirits today. 9/21; patient presents for 1 week follow-up. He has no issues or complaints today. 9/28; patient presents for 1 week follow-up. He has tolerated the compression wrap well. He has no issues or complaints today. 10/5; patient presents for 1 week follow-up. He has no issues or complaints today. 10/12; patient presents for 1 week follow-up. He has no issues or complaints today. He denies signs of infection. HUDSEN, FEI ([id number]) Readmission: 04-08-2021 upon evaluation today patient appears to be doing poorly in regard to his right lower extremity. He is being seen for reevaluation here in the clinic he was last discharged healed in October 2022. Subsequently at that time it was noted in the chart that he was told to be wearing compression socks unfortunately the patient tells me he never remembers being told that. I did discuss this with him today as well as Hosie Poisson discussing it with him today again. Obviously he does know at this point once he  is healed he should be wearing compression socks all the time from the time he gets up in the morning to when he goes to sleep at night day in and day out. I think this is the only way that you can to keep things under control. Otherwise his medical history has not changed his arterial study in the past was excellent and did not appear to show any signs of issue at this point which is great news as well. 04-15-2021 upon evaluation today patient's wound is actually looking much drier than what it was last week. Again I do believe the infection is showing signs of improvement although he is having pain for sore I  think this has to do more with the dryness of the wound at this time as opposed to the infection that we were noted in the last week. Fortunately I do not see any evidence of active infection locally or systemically at this time which is great news. 04-22-2021 upon evaluation today patient unfortunately continues to have issues with his leg. This is getting significantly worse despite being on Augmentin. I am actually extremely concerned about worsening infection and especially considering the way the wound looks today and the odor present. 05-04-2021 upon evaluation today patient appears to be doing better with regard to the infection in his leg. He has been tolerating the dressing changes without complication and overall I am extremely pleased with where we stand. There does not appear to be any evidence of active infection locally or systemically which is great news. 05-13-2021 upon evaluation today patient appears to be doing better in regard to his leg ulcer. He has been tolerating the dressing changes without complication. He does have still significant pain he does have a lot of necrotic tissue which is working its way off and there is no signs of significant infection at this point which is great news. With that being said I do believe that the patient does need to likely continue with the wound care measures at this point with regard to the dressing that we are utilizing. 05-20-2021 upon evaluation today patient's wound is actually showing signs of fairly good granulation and epithelization in a lot of areas he still has some slough noted at this point though a lot of the eschar is softening up I do believe the Xeroform has been helpful in this regard. Fortunately I do not see any evidence of active infection locally or systemically which is great news. No fevers, chills, nausea, vomiting, or diarrhea. I do believe the 3 layer compression wrap has been beneficial. 05-27-2021 upon evaluation today  patient's wound is actually showing signs of significant improvement with a lot of new epithelial growth at this point. Fortunately I do not see any signs of active infection locally or systemically which is great news and overall I think we are headed in the right direction. 06-03-2021 upon evaluation today patient appears to be doing well with regard to his wounds although he still has a lot of slough and biofilm buildup noted. We are going to need to try to do some sharp debridement today to clear this away and he is in agreement with allowing me to try what we can. I am not certain how much working to be able to remove or not. 06-10-2021 upon evaluation today patient appears to be doing well with regard to his wound from the overall appearance and size standpoint this is good. From a drainage standpoint and the odor as well this  seems to still be infected in my opinion. I do not think the Augmentin was sufficient. With that being said I discussed this with the patient I do believe that he may benefit from topical [place] antibiotics in the meantime we will get a use gentamicin to bridge the gap until we get those. 06-17-2021 upon evaluation today patient appears to be doing better in regard to his leg. I have not been able to get him the [facility] as it was too expensive for the topical antibiotics. For that reason would been using gentamicin which I do feel like is helping and I do think that is probably can to be our best bet at this point all things considered. He is in agreement with giving this a trial and seeing how things go. Fortunately I see no evidence of active infection systemically locally some of the odor seems indicate there may be some bacteria present but I am not sure what is best to do past what we are already doing. 06-24-2021 upon evaluation today patient appears to be doing well with regard to his healing in fact he is got more epithelial growth than what we have seen previous.  Fortunately I think that each week I see him this is looking better and better. Fortunately I see no signs of active infection at this time locally or systemically which is great news. 07-01-2021 upon evaluation today patient appears to be doing excellent in regard to his leg ulcers. He has been tolerating the dressing changes without complication. Fortunately I see no evidence of active infection locally or systemically at this time which is great news. Overall I am extremely happy with where we stand today. 7/5; patient presents for follow-up. We have been using gentamicin and Xeroform under compression therapy. He has no issues or complaints today. 07-15-2021 upon evaluation today patient appears to be doing well with regard to his leg is almost completely healed. Fortunately I do not see any evidence of active infection locally or systemically which is great news. No fevers, chills, nausea, vomiting, or diarrhea. 07-22-2021 upon evaluation patient appears to be almost completely healed. I thought we were going to be there today but we are not quite so. Fortunately I do not see any evidence of active infection locally or systemically which is great news and overall I am extremely pleased with where we stand currently. 7/27; the patient's wounds on the right lower leg which are secondary to chronic venous insufficiency are healed. His edema control is excellent. He has 1 pair of 20/30 below-knee stockings from elastic therapy. Unfortunately he did not bring those in today however he is going straight home 08-05-2021 upon evaluation today patient appears to be doing well currently in regard to his wound. He has been tolerating the dressing changes without complication on the lateral aspect but unfortunately because he was not wearing his compression sock he actually has become swollen and has a blister that opened up on the medial aspect. We are going to need to train him a little bit better as far as  how to use the compression stocking. I do believe this is the right strength in the right size I do not think it is too tight for him but it is can be hard for him to get if he keeps trying to do it the same way he was. AUSTYN, SEIER ([id number]) Electronic Signature(s) Signed: 08/05/2021 10:13:04 AM By: Worthy Keeler PA-C Entered By: Worthy Keeler on 08/05/2021 10:13:04 Wingard,  Virgil Benedict ([id number]) -------------------------------------------------------------------------------- Physical Exam Details Patient Name: GJON, LETARTE Date of Service: 08/05/2021 9:15 AM Medical Record Number: [id number] Patient Account Number: [id number] Date of Birth/Sex: May 05, 1955 (66 y.o. M) Treating RN: Levora Dredge Primary Care Provider: Reeves Dam Other Clinician: Referring Provider: Reeves Dam Treating Provider/Extender: Jeri Cos Weeks in Treatment: 39 Constitutional Well-nourished and well-hydrated in no acute distress. Respiratory normal breathing without difficulty. Psychiatric this patient is able to make decisions and demonstrates good insight into disease process. Alert and Oriented x 3. pleasant and cooperative. Notes Upon inspection patient's wound bed actually showed signs of good granulation and epithelization at this point. Fortunately I do not see any evidence of infection locally or systemically which is great news and overall I am extremely pleased with where we stand today. With that being said we are going to need to go ahead and get him started with a compression wrap again on the right leg as he does have open wounds on the left he needs to be wearing his compression sock. Electronic Signature(s) Signed: 08/05/2021 10:13:36 AM By: Worthy Keeler PA-C Entered By: Worthy Keeler on 08/05/2021 10:13:36 Shane Bean ([id number]) -------------------------------------------------------------------------------- Physician Orders Details Patient Name:  Shane Bean Date of Service: 08/05/2021 9:15 AM Medical Record Number: [id number] Patient Account Number: [id number] Date of Birth/Sex: 1955/01/12 (65 y.o. M) Treating RN: Levora Dredge Primary Care Provider: Reeves Dam Other Clinician: Referring Provider: Reeves Dam Treating Provider/Extender: Skipper Cliche in Treatment: 17 Verbal / Phone Orders: No Diagnosis Coding ICD-10 Coding Code Description E11.622 Type 2 diabetes mellitus with other skin ulcer I87.2 Venous insufficiency (chronic) (peripheral) 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 layer exposed L97.812 Non-pressure chronic ulcer of other part of right lower leg with fat layer exposed I10 Essential (primary) hypertension Follow-up Appointments o Return Appointment in 1 week. Bathing/ Shower/ Hygiene o May shower with wound dressing protected with water repellent cover or cast protector. o No tub bath. Edema Control - Lymphedema / Segmental Compressive Device / Other o Optional: One layer of unna paste to top of compression wrap (to act as an anchor). o Elevate, Exercise Daily and Avoid Standing for Long Periods of Time. o Elevate leg(s) parallel to the floor when sitting. o DO YOUR BEST to sleep in the bed at night. DO NOT sleep in your recliner. Long hours of sitting in a recliner leads to swelling of the legs and/or potential wounds on your backside. Wound Treatment Wound #4 - Lower Leg Wound Laterality: Right, Medial Cleanser: Soap and Water 1 x Per Week/15 Days Discharge Instructions: Gently cleanse wound with antibacterial soap, rinse and pat dry prior to dressing wounds Primary Dressing: Silvercel 4 1/4x 4 1/4 (in/in) 1 x Per Week/15 Days Discharge Instructions: Apply Silvercel 4 1/4x 4 1/4 (in/in) as instructed Secondary Dressing: ABD Pad 5x9 (in/in) 1 x Per Week/15 Days Discharge Instructions: Cover with ABD pad Compression Wrap: 3-LAYER WRAP -  Profore Lite LF 3 Multilayer Compression Bandaging System 1 x Per Week/15 Days Discharge Instructions: Apply 3 multi-layer wrap as prescribed. Electronic Signature(s) Signed: 08/05/2021 4:10:56 PM By: Levora Dredge Signed: 08/05/2021 6:41:29 PM By: Worthy Keeler PA-C Entered By: Levora Dredge on 08/05/2021 10:13:51 Shane Bean ([id number]) -------------------------------------------------------------------------------- Problem List Details Patient Name: Shane Bean Date of Service: 08/05/2021 9:15 AM Medical Record Number: [id number] Patient Account Number: [id number] Date of Birth/Sex: 01/21/1955 (65 y.o. M) Treating RN: Levora Dredge Primary Care Provider: Reeves Dam Other  Clinician: Referring Provider: Reeves Dam Treating Provider/Extender: Skipper Cliche in Treatment: 17 Active Problems ICD-10 Encounter Code Description Active Date MDM Diagnosis E11.622 Type 2 diabetes mellitus with other skin ulcer 04/08/2021 No Yes I87.2 Venous insufficiency (chronic) (peripheral) 04/08/2021 No Yes 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 layer 04/08/2021 No Yes exposed L97.812 Non-pressure chronic ulcer of other part of right lower leg with fat layer 04/08/2021 No Yes exposed I10 Essential (primary) hypertension 04/08/2021 No Yes Inactive Problems Resolved Problems Electronic Signature(s) Signed: 08/05/2021 9:49:36 AM By: Worthy Keeler PA-C Entered By: Worthy Keeler on 08/05/2021 09:49:36 Shane Bean ([id number]) -------------------------------------------------------------------------------- Progress Note Details Patient Name: Shane Bean Date of Service: 08/05/2021 9:15 AM Medical Record Number: [id number] Patient Account Number: [id number] Date of Birth/Sex: 03-01-1955 (65 y.o. M) Treating RN: Levora Dredge Primary Care Provider: Reeves Dam Other Clinician: Referring Provider: Reeves Dam Treating  Provider/Extender: Skipper Cliche in Treatment: 17 Subjective Chief Complaint Information obtained from Patient Right LE Ulcer History of Present Illness (HPI) 05/18/2020 upon evaluation today patient appears for initial evaluation here in clinic concerning issues that he has been having with the wound on his left lateral leg. Fortunately there does not appear to be any signs of obvious an active infection at this time which is great news. With that being said the patient unfortunately is continued to have issues with pain although he tells me it is gotten a lot better. He was on Keflex as well as Bactrim DS which seems to have done a good job there. His most recent hemoglobin A1c was 9.8 that was on 04/14/2020. Subsequently I do feel like that the patient is making progress here. He does have a history of chronic venous insufficiency as well as hypertension. I do not see any need for antibiotics at this point. 5/25; follow-up of the wound on the left posterior calf. This is completely necrotic on the surface. It does not look infected but it is painful. He is a diabetic but I think there is some suggestion here of chronic venous disease as well. We used Iodoflex under compression last week 6/1; again a completely necrotic surface on this with the wound. We have been using Iodoflex under compression he complains of gnawing pain. He has had wounds previously a lot of this looks like chronic venous disease. 6/8; about two thirds of the surface of this wound is still covered in a black necrotic eschar. I changed him from Iodoflex to [place] last week because of complaints of pain. He actually was seen by her neurologist this weeko Peripheral neuropathy as a cause of the pain and they changed him from Neurontin to Lyrica but he still has not had any relief. He says that most of the pain however is in his heel not in his wound per se. He is a diabetic 6/15. This is a patient with what looks to be a  venous wound on the left lateral lower leg. He is also a diabetic. Currently being worked up for diabetic neuropathy. He complains of pain out of proportion to the size of the wound although to be fair the entire area here was eschared. We have been working to get a viable surface. We are using Sorbact 6/22; fairly painful wound on the left posterior calf. I am assuming this is been venous he is also a diabetic. His ABI in our clinic was noncompressible however he has easily palpable pulses on his feet. He  complains of a lot of pain in the wound also of the left heel and the upper left calf. Some of this may be neuropathy. It is led me to discontinue Iodoflex reduce his compression but he still complains of pain in fact he says he could not take a debridement today. When I first saw this it was completely necrotic surface we have got it down to something that looks a reasonably healthy I have been wondering about biopsying this area however he is on Coumadin and with the pain I have put this off. The major question would be an inflammatory ulcer such as pyoderma. The patient is not aware of how this started. He does however have a wound history on both legs. 6/29; patient presents for 1 week follow-up. He has been using Hydrofera Blue under Kerlix/Coban. He has tolerated this well. He currently denies signs of infection. He also declines debridement today. He he states it is painful and does not want to have this done. 7/6; Patient presents for 1 week follow-up. He has been using Hydrofera Blue under Kerlix/Coban. He denies signs of infection. He declines debridement today. 7/15; patient presents for 1 week follow-up. He has been using Hydrofera Blue under Kerlix/Coban. He denies signs of infection today. He is agreeable to having debridement done today. 7/20; patient presents for 1 week follow-up. He is in good spirits today. He has been using Hydrofera Blue under Kerlix/Coban. He denies signs  of infection today. 7/27; patient presents for 1 week follow-up. He is in good spirits today. He has no issues or complaints today. He denies signs of infection. 08/12/2020 upon evaluation today patient appears to be doing better in regard to his wound. He has been tolerating the dressing changes without complication. With that being said he did have arterial studies and does show that he has a TBI on the right of 0.90 and a TBI on the left of 0.76 which is excellent. There is no evidence of arterial compromise at this point. 8/17; patient presents for 1 week follow-up. He has no issues or complaints today. He denies signs of infection. 8/24; patient presents for 1 week follow-up. He has no issues or complaints today. He denies signs of infection. He continues to tolerate the compression wrap well. 8/31; patient presents for 1 week follow-up. He has no issues or complaints today. He denies signs of infection. He is in good spirits today. 9/7; patient presents for 1 week follow-up. He is tolerated the compression wrap well. He has no issues or complaints today. He denies signs of infection. 9/14; patient presents for 1 week follow-up. He has no issues or complaints today. He denies signs of infection. He is in excellent spirits today. 9/21; patient presents for 1 week follow-up. He has no issues or complaints today. ADYNN, CASERES ([id number]) 9/28; patient presents for 1 week follow-up. He has tolerated the compression wrap well. He has no issues or complaints today. 10/5; patient presents for 1 week follow-up. He has no issues or complaints today. 10/12; patient presents for 1 week follow-up. He has no issues or complaints today. He denies signs of infection. Readmission: 04-08-2021 upon evaluation today patient appears to be doing poorly in regard to his right lower extremity. He is being seen for reevaluation here in the clinic he was last discharged healed in October 2022. Subsequently at  that time it was noted in the chart that he was told to be wearing compression socks unfortunately the patient tells me he  never remembers being told that. I did discuss this with him today as well as Hosie Poisson discussing it with him today again. Obviously he does know at this point once he is healed he should be wearing compression socks all the time from the time he gets up in the morning to when he goes to sleep at night day in and day out. I think this is the only way that you can to keep things under control. Otherwise his medical history has not changed his arterial study in the past was excellent and did not appear to show any signs of issue at this point which is great news as well. 04-15-2021 upon evaluation today patient's wound is actually looking much drier than what it was last week. Again I do believe the infection is showing signs of improvement although he is having pain for sore I think this has to do more with the dryness of the wound at this time as opposed to the infection that we were noted in the last week. Fortunately I do not see any evidence of active infection locally or systemically at this time which is great news. 04-22-2021 upon evaluation today patient unfortunately continues to have issues with his leg. This is getting significantly worse despite being on Augmentin. I am actually extremely concerned about worsening infection and especially considering the way the wound looks today and the odor present. 05-04-2021 upon evaluation today patient appears to be doing better with regard to the infection in his leg. He has been tolerating the dressing changes without complication and overall I am extremely pleased with where we stand. There does not appear to be any evidence of active infection locally or systemically which is great news. 05-13-2021 upon evaluation today patient appears to be doing better in regard to his leg ulcer. He has been tolerating the dressing changes without  complication. He does have still significant pain he does have a lot of necrotic tissue which is working its way off and there is no signs of significant infection at this point which is great news. With that being said I do believe that the patient does need to likely continue with the wound care measures at this point with regard to the dressing that we are utilizing. 05-20-2021 upon evaluation today patient's wound is actually showing signs of fairly good granulation and epithelization in a lot of areas he still has some slough noted at this point though a lot of the eschar is softening up I do believe the Xeroform has been helpful in this regard. Fortunately I do not see any evidence of active infection locally or systemically which is great news. No fevers, chills, nausea, vomiting, or diarrhea. I do believe the 3 layer compression wrap has been beneficial. 05-27-2021 upon evaluation today patient's wound is actually showing signs of significant improvement with a lot of new epithelial growth at this point. Fortunately I do not see any signs of active infection locally or systemically which is great news and overall I think we are headed in the right direction. 06-03-2021 upon evaluation today patient appears to be doing well with regard to his wounds although he still has a lot of slough and biofilm buildup noted. We are going to need to try to do some sharp debridement today to clear this away and he is in agreement with allowing me to try what we can. I am not certain how much working to be able to remove or not. 06-10-2021 upon evaluation today patient  appears to be doing well with regard to his wound from the overall appearance and size standpoint this is good. From a drainage standpoint and the odor as well this seems to still be infected in my opinion. I do not think the Augmentin was sufficient. With that being said I discussed this with the patient I do believe that he may benefit from topical  [facility] antibiotics in the meantime we will get a use gentamicin to bridge the gap until we get those. 06-17-2021 upon evaluation today patient appears to be doing better in regard to his leg. I have not been able to get him the [facility] as it was too expensive for the topical antibiotics. For that reason would been using gentamicin which I do feel like is helping and I do think that is probably can to be our best bet at this point all things considered. He is in agreement with giving this a trial and seeing how things go. Fortunately I see no evidence of active infection systemically locally some of the odor seems indicate there may be some bacteria present but I am not sure what is best to do past what we are already doing. 06-24-2021 upon evaluation today patient appears to be doing well with regard to his healing in fact he is got more epithelial growth than what we have seen previous. Fortunately I think that each week I see him this is looking better and better. Fortunately I see no signs of active infection at this time locally or systemically which is great news. 07-01-2021 upon evaluation today patient appears to be doing excellent in regard to his leg ulcers. He has been tolerating the dressing changes without complication. Fortunately I see no evidence of active infection locally or systemically at this time which is great news. Overall I am extremely happy with where we stand today. 7/5; patient presents for follow-up. We have been using gentamicin and Xeroform under compression therapy. He has no issues or complaints today. 07-15-2021 upon evaluation today patient appears to be doing well with regard to his leg is almost completely healed. Fortunately I do not see any evidence of active infection locally or systemically which is great news. No fevers, chills, nausea, vomiting, or diarrhea. 07-22-2021 upon evaluation patient appears to be almost completely healed. I thought we were going to  be there today but we are not quite so. Fortunately I do not see any evidence of active infection locally or systemically which is great news and overall I am extremely pleased with where we stand currently. 7/27; the patient's wounds on the right lower leg which are secondary to chronic venous insufficiency are healed. His edema control is excellent. He has 1 pair of 20/30 below-knee stockings from elastic therapy. Unfortunately he did not bring those in today however he is going straight home ADOLPH, CLUTTER V. ([id number]) 08-05-2021 upon evaluation today patient appears to be doing well currently in regard to his wound. He has been tolerating the dressing changes without complication on the lateral aspect but unfortunately because he was not wearing his compression sock he actually has become swollen and has a blister that opened up on the medial aspect. We are going to need to train him a little bit better as far as how to use the compression stocking. I do believe this is the right strength in the right size I do not think it is too tight for him but it is can be hard for him to get if he  keeps trying to do it the same way he was. Objective Constitutional Well-nourished and well-hydrated in no acute distress. Vitals Time Taken: 9:17 AM, Height: 70 in, Weight: 277 lbs, BMI: 39.7, Temperature: 98.2 F, Pulse: 84 bpm, Respiratory Rate: 18 breaths/min, Blood Pressure: 160/75 mmHg. Respiratory normal breathing without difficulty. Psychiatric this patient is able to make decisions and demonstrates good insight into disease process. Alert and Oriented x 3. pleasant and cooperative. General Notes: Upon inspection patient's wound bed actually showed signs of good granulation and epithelization at this point. Fortunately I do not see any evidence of infection locally or systemically which is great news and overall I am extremely pleased with where we stand today. With that being said we are going  to need to go ahead and get him started with a compression wrap again on the right leg as he does have open wounds on the left he needs to be wearing his compression sock. Integumentary (Hair, Skin) Wound #4 status is Open. Original cause of wound was Blister. The date acquired was: 07/30/2021. The wound is located on the Right,Medial Lower Leg. The wound measures 2.1cm length x 1.9cm width x 0.1cm depth; 3.134cm^2 area and 0.313cm^3 volume. There is Fat Layer (Subcutaneous Tissue) exposed. There is no tunneling or undermining noted. There is a medium amount of serosanguineous drainage noted. There is large (67-100%) red granulation within the wound bed. There is no necrotic tissue within the wound bed. Assessment Active Problems ICD-10 Type 2 diabetes mellitus with other skin ulcer Venous insufficiency (chronic) (peripheral) Non-pressure chronic ulcer of other part of left lower leg with fat layer exposed Non-pressure chronic ulcer of other part of right lower leg with fat layer exposed Essential (primary) hypertension Plan 1. I would recommend currently that he continue to wear the compression sock and practice on the left leg that we will be ready for the right leg once this gets healed again. 2. I am also can recommend we have the patient continue to monitor for any signs of worsening or infection if anything changes he should contact the office and let me know. We will see patient back for reevaluation in 1 week here in the clinic. If anything worsens or changes patient will contact our office for additional recommendations. Electronic Signature(s) Signed: 08/05/2021 10:13:56 AM By: Maeola Harman, Virgil Benedict ([id number]) Entered By: Worthy Keeler on 08/05/2021 10:13:56 Shane Bean ([id number]) -------------------------------------------------------------------------------- SuperBill Details Patient Name: Shane Bean Date of Service: 08/05/2021 Medical  Record Number: [id number] Patient Account Number: [id number] Date of Birth/Sex: Oct 14, 1955 (65 y.o. M) Treating RN: Levora Dredge Primary Care Provider: Reeves Dam Other Clinician: Referring Provider: Reeves Dam Treating Provider/Extender: Jeri Cos Weeks in Treatment: 17 Diagnosis Coding ICD-10 Codes Code Description E11.622 Type 2 diabetes mellitus with other skin ulcer I87.2 Venous insufficiency (chronic) (peripheral) 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 layer exposed L97.812 Non-pressure chronic ulcer of other part of right lower leg with fat layer exposed I10 Essential (primary) hypertension Facility Procedures CPT4 Code: [id number] Description: (Facility Use Only) [phone] - APPLY MULTLAY COMPRS LWR RT LEG Modifier: Quantity: 1 Physician Procedures CPT4 Code: 1937902 Description: 40973 - WC PHYS LEVEL 3 - EST PT Modifier: Quantity: 1 CPT4 Code: Description: ICD-10 Diagnosis Description E11.622 Type 2 diabetes mellitus with other skin ulcer I87.2 Venous insufficiency (chronic) (peripheral) 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 lay L97.812 Non-pressure  chronic ulcer of other part  of right lower leg with fat la Modifier: er exposed yer exposed Quantity: Electronic Signature(s) Signed: 08/05/2021 10:45:14 AM By: Levora Dredge Signed: 08/05/2021 6:41:29 PM By: Worthy Keeler PA-C Previous Signature: 08/05/2021 10:14:24 AM Version By: Worthy Keeler PA-C Entered By: Levora Dredge on 08/05/2021 10:45:14

## 2021-08-13 ENCOUNTER — Encounter: Payer: Medicare (Managed Care) | Admitting: Physician Assistant

## 2021-08-13 DIAGNOSIS — E11622 Type 2 diabetes mellitus with other skin ulcer: Secondary | ICD-10-CM | POA: Diagnosis not present

## 2021-08-13 NOTE — Progress Notes (Signed)
Bean, Shane ([id number]) Visit Report for 08/13/2021 Arrival Information Details Patient Name: Shane Bean, Shane Bean Date of Service: 08/13/2021 10:45 AM Medical Record Number: [id number] Patient Account Number: [id number] Date of Birth/Sex: August 10, 1955 (66 y.o. M) Treating RN: Levora Dredge Primary Care Evian Derringer: Reeves Dam Other Clinician: Referring Quamesha Mullet: Reeves Dam Treating Katiria Calame/Extender: Skipper Cliche in Treatment: 18 Visit Information History Since Last Visit Added or deleted any medications: No Patient Arrived: Ambulatory Any new allergies or adverse reactions: No Arrival Time: 10:40 Had a fall or experienced change in No Accompanied By: self activities of daily living that may affect Transfer Assistance: None risk of falls: Patient Identification Verified: Yes Hospitalized since last visit: No Secondary Verification Process Completed: Yes Has Dressing in Place as Prescribed: Yes Patient Requires Transmission-Based No Has Compression in Place as Prescribed: Yes Precautions: Pain Present Now: No Patient Has Alerts: Yes Patient Alerts: Patient on Blood Thinner ABI R Wendell TBI .90 7/22 ABI L 1.46 TBI .76 7/22 Electronic Signature(s) Signed: 08/13/2021 4:18:40 PM By: Levora Dredge Entered By: Levora Dredge on 08/13/2021 10:41:02 Shane Bean ([id number]) -------------------------------------------------------------------------------- Clinic Level of Care Assessment Details Patient Name: Shane Bean Date of Service: 08/13/2021 10:45 AM Medical Record Number: [id number] Patient Account Number: [id number] Date of Birth/Sex: 1955-08-12 (65 y.o. M) Treating RN: Levora Dredge Primary Care Renee Beale: Reeves Dam Other Clinician: Referring Lexa Coronado: Reeves Dam Treating Kayne Yuhas/Extender: Skipper Cliche in Treatment: 18 Clinic Level of Care Assessment Items TOOL 1 Quantity Score '[]'$  - Use when EandM and Procedure is performed on  INITIAL visit 0 ASSESSMENTS - Nursing Assessment / Reassessment '[]'$  - General Physical Exam (combine w/ comprehensive assessment (listed just below) when performed on new 0 pt. evals) '[]'$  - 0 Comprehensive Assessment (HX, ROS, Risk Assessments, Wounds Hx, etc.) ASSESSMENTS - Wound and Skin Assessment / Reassessment '[]'$  - Dermatologic / Skin Assessment (not related to wound area) 0 ASSESSMENTS - Ostomy and/or Continence Assessment and Care '[]'$  - Incontinence Assessment and Management 0 '[]'$  - 0 Ostomy Care Assessment and Management (repouching, etc.) PROCESS - Coordination of Care '[]'$  - Simple Patient / Family Education for ongoing care 0 '[]'$  - 0 Complex (extensive) Patient / Family Education for ongoing care '[]'$  - 0 Staff obtains Programmer, systems, Records, Test Results / Process Orders '[]'$  - 0 Staff telephones HHA, Nursing Homes / Clarify orders / etc '[]'$  - 0 Routine Transfer to another Facility (non-emergent condition) '[]'$  - 0 Routine Hospital Admission (non-emergent condition) '[]'$  - 0 New Admissions / Biomedical engineer / Ordering NPWT, Apligraf, etc. '[]'$  - 0 Emergency Hospital Admission (emergent condition) PROCESS - Special Needs '[]'$  - Pediatric / Minor Patient Management 0 '[]'$  - 0 Isolation Patient Management '[]'$  - 0 Hearing / Language / Visual special needs '[]'$  - 0 Assessment of Community assistance (transportation, D/C planning, etc.) '[]'$  - 0 Additional assistance / Altered mentation '[]'$  - 0 Support Surface(s) Assessment (bed, cushion, seat, etc.) INTERVENTIONS - Miscellaneous '[]'$  - External ear exam 0 '[]'$  - 0 Patient Transfer (multiple staff / Civil Service fast streamer / Similar devices) '[]'$  - 0 Simple Staple / Suture removal (25 or less) '[]'$  - 0 Complex Staple / Suture removal (26 or more) '[]'$  - 0 Hypo/Hyperglycemic Management (do not check if billed separately) '[]'$  - 0 Ankle / Brachial Index (ABI) - do not check if billed separately Has the patient been seen at the hospital within the last three  years: Yes Total Score: 0 Level Of Care: ____ Shane Bean ([id number]) Electronic Signature(s) Signed: 08/13/2021 4:18:40  PM By: Levora Dredge Entered By: Levora Dredge on 08/13/2021 11:12:56 Shane Bean ([id number]) -------------------------------------------------------------------------------- Compression Therapy Details Patient Name: Shane Bean Date of Service: 08/13/2021 10:45 AM Medical Record Number: [id number] Patient Account Number: [id number] Date of Birth/Sex: 02-22-1955 (65 y.o. M) Treating RN: Levora Dredge Primary Care Akelia Husted: Reeves Dam Other Clinician: Referring Brevin Mcfadden: Reeves Dam Treating Gayle Martinez/Extender: Jeri Cos Weeks in Treatment: 18 Compression Therapy Performed for Wound Assessment: Wound #4 Right,Medial Lower Leg Performed By: Clinician Levora Dredge, RN Compression Type: Three Layer Post Procedure Diagnosis Same as Pre-procedure Notes pt tolerated wrap without issue Electronic Signature(s) Signed: 08/13/2021 4:18:40 PM By: Levora Dredge Entered By: Levora Dredge on 08/13/2021 11:12:44 Shane Bean ([id number]) -------------------------------------------------------------------------------- Encounter Discharge Information Details Patient Name: Shane Bean Date of Service: 08/13/2021 10:45 AM Medical Record Number: [id number] Patient Account Number: [id number] Date of Birth/Sex: December 17, 1955 (65 y.o. M) Treating RN: Levora Dredge Primary Care Lyon Dumont: Reeves Dam Other Clinician: Referring Terryon Pineiro: Reeves Dam Treating Trenace Coughlin/Extender: Skipper Cliche in Treatment: 18 Encounter Discharge Information Items Discharge Condition: Stable Ambulatory Status: Ambulatory Discharge Destination: Home Transportation: Private Auto Accompanied By: self Schedule Follow-up Appointment: Yes Clinical Summary of Care: Electronic Signature(s) Signed: 08/13/2021 4:18:40 PM By: Levora Dredge Entered  By: Levora Dredge on 08/13/2021 11:13:41 Shane Bean ([id number]) -------------------------------------------------------------------------------- Lower Extremity Assessment Details Patient Name: Shane Bean Date of Service: 08/13/2021 10:45 AM Medical Record Number: [id number] Patient Account Number: [id number] Date of Birth/Sex: 18-Jun-1955 (65 y.o. M) Treating RN: Levora Dredge Primary Care Chue Berkovich: Reeves Dam Other Clinician: Referring Lubna Stegeman: Reeves Dam Treating Kadience Macchi/Extender: Jeri Cos Weeks in Treatment: 18 Edema Assessment Assessed: [Left: No] [Right: No] Edema: [Left: Ye] [Right: s] Calf Left: Right: Point of Measurement: 34 cm From Medial Instep 44 cm Ankle Left: Right: Point of Measurement: 11 cm From Medial Instep 25.5 cm Electronic Signature(s) Signed: 08/13/2021 4:18:40 PM By: Levora Dredge Entered By: Levora Dredge on 08/13/2021 10:49:06 Shane Bean ([id number]) -------------------------------------------------------------------------------- Multi Wound Chart Details Patient Name: Shane Bean Date of Service: 08/13/2021 10:45 AM Medical Record Number: [id number] Patient Account Number: [id number] Date of Birth/Sex: 07-Mar-1955 (65 y.o. M) Treating RN: Levora Dredge Primary Care Zephyra Bernardi: Reeves Dam Other Clinician: Referring Chardonnay Holzmann: Reeves Dam Treating Avice Funchess/Extender: Skipper Cliche in Treatment: 18 Vital Signs Height(in): 70 Pulse(bpm): 57 Weight(lbs): 573 Blood Pressure(mmHg): 124/61 Body Mass Index(BMI): 39.7 Temperature(F): 98.2 Respiratory Rate(breaths/min): 18 Photos: [N/A:N/A] Wound Location: Right, Medial Lower Leg N/A N/A Wounding Event: Blister N/A N/A Primary Etiology: Diabetic Wound/Ulcer of the Lower N/A N/A Extremity Comorbid History: Hypertension, Type II Diabetes, N/A N/A Neuropathy Date Acquired: 07/30/2021 N/A N/A Weeks of Treatment: 1 N/A N/A Wound Status: Open N/A  N/A Wound Recurrence: No N/A N/A Measurements L x W x D (cm) 0.1x0.1x0.1 N/A N/A Area (cm) : 0.008 N/A N/A Volume (cm) : 0.001 N/A N/A % Reduction in Area: 99.70% N/A N/A % Reduction in Volume: 99.70% N/A N/A Classification: Grade 1 N/A N/A Exudate Amount: None Present N/A N/A Granulation Amount: None Present (0%) N/A N/A Necrotic Amount: None Present (0%) N/A N/A Exposed Structures: Fat Layer (Subcutaneous Tissue): N/A N/A No Epithelialization: Large (67-100%) N/A N/A Treatment Notes Electronic Signature(s) Signed: 08/13/2021 4:18:40 PM By: Levora Dredge Entered By: Levora Dredge on 08/13/2021 10:57:33 Shane Bean ([id number]) -------------------------------------------------------------------------------- [place] Details Patient Name: Shane Bean Date of Service: 08/13/2021 10:45 AM Medical Record Number: [id number] Patient Account Number: [id number] Date of Birth/Sex: 1955-11-16 (65 y.o. M) Treating RN: Levora Dredge Primary Care Labarron Durnin:  Reeves Dam Other Clinician: Referring Rosamund Nyland: Reeves Dam Treating Valerio Pinard/Extender: Jeri Cos Weeks in Treatment: 18 Active Inactive Electronic Signature(s) Signed: 08/13/2021 10:56:08 AM By: Levora Dredge Entered By: Levora Dredge on 08/13/2021 10:56:07 Shane Bean ([id number]) -------------------------------------------------------------------------------- Pain Assessment Details Patient Name: Shane Bean Date of Service: 08/13/2021 10:45 AM Medical Record Number: [id number] Patient Account Number: [id number] Date of Birth/Sex: November 22, 1955 (65 y.o. M) Treating RN: Levora Dredge Primary Care Kelcy Baeten: Reeves Dam Other Clinician: Referring Kenyatte Chatmon: Reeves Dam Treating Roslyn Else/Extender: Skipper Cliche in Treatment: 18 Active Problems Location of Pain Severity and Description of Pain Patient Has Paino No Site Locations Rate the pain. Current Pain  Level: 0 Pain Management and Medication Current Pain Management: Electronic Signature(s) Signed: 08/13/2021 4:18:40 PM By: Levora Dredge Entered By: Levora Dredge on 08/13/2021 10:41:45 Shane Bean ([id number]) -------------------------------------------------------------------------------- Patient/Caregiver Education Details Patient Name: Shane Bean Date of Service: 08/13/2021 10:45 AM Medical Record Number: [id number] Patient Account Number: [id number] Date of Birth/Gender: [birth year] (65 y.o. M) Treating RN: Levora Dredge Primary Care Physician: Reeves Dam Other Clinician: Referring Physician: Reeves Dam Treating Physician/Extender: Skipper Cliche in Treatment: 18 Education Assessment Education Provided To: Patient Education Topics Provided Wound/Skin Impairment: Handouts: Caring for Your Ulcer Methods: Explain/Verbal Responses: State content correctly Electronic Signature(s) Signed: 08/13/2021 4:18:40 PM By: Levora Dredge Entered By: Levora Dredge on 08/13/2021 11:13:16 Shane Bean ([id number]) -------------------------------------------------------------------------------- Wound Assessment Details Patient Name: Shane Bean Date of Service: 08/13/2021 10:45 AM Medical Record Number: [id number] Patient Account Number: [id number] Date of Birth/Sex: 1955/09/04 (65 y.o. M) Treating RN: Levora Dredge Primary Care Harumi Yamin: Reeves Dam Other Clinician: Referring Roslyn Else: Reeves Dam Treating Nariah Morgano/Extender: Jeri Cos Weeks in Treatment: 18 Wound Status Wound Number: 4 Primary Etiology: Diabetic Wound/Ulcer of the Lower Extremity Wound Location: Right, Medial Lower Leg Wound Status: Open Wounding Event: Blister Comorbid History: Hypertension, Type II Diabetes, Neuropathy Date Acquired: 07/30/2021 Weeks Of Treatment: 1 Clustered Wound: No Photos Wound Measurements Length: (cm) 0.1 Width: (cm) 0.1 Depth: (cm)  0.1 Area: (cm) 0.008 Volume: (cm) 0.001 % Reduction in Area: 99.7% % Reduction in Volume: 99.7% Epithelialization: Large (67-100%) Tunneling: No Undermining: No Wound Description Classification: Grade 1 Exudate Amount: None Present Foul Odor After Cleansing: No Slough/Fibrino Yes Wound Bed Granulation Amount: None Present (0%) Exposed Structure Necrotic Amount: None Present (0%) Fat Layer (Subcutaneous Tissue) Exposed: No Treatment Notes Wound #4 (Lower Leg) Wound Laterality: Right, Medial Cleanser Soap and Water Discharge Instruction: Gently cleanse wound with antibacterial soap, rinse and pat dry prior to dressing wounds Peri-Wound Care Topical Primary Dressing Curad Oil Emulsion Dressing 3x3 (in/in) Secondary Dressing ABD Pad 5x9 (in/in) Knupp, Deng VMarland Kitchen ([id number]) Discharge Instruction: Cover with ABD pad Secured With Compression Wrap 3-LAYER WRAP - Profore Lite LF 3 Multilayer Compression Bandaging System Discharge Instruction: Apply 3 multi-layer wrap as prescribed. Compression Stockings Add-Ons Electronic Signature(s) Signed: 08/13/2021 4:18:40 PM By: Levora Dredge Entered By: Levora Dredge on 08/13/2021 10:48:23 Shane Bean ([id number]) -------------------------------------------------------------------------------- Vitals Details Patient Name: Shane Bean Date of Service: 08/13/2021 10:45 AM Medical Record Number: [id number] Patient Account Number: [id number] Date of Birth/Sex: Jan 28, 1955 (65 y.o. M) Treating RN: Levora Dredge Primary Care [place] Cantero: Reeves Dam Other Clinician: Referring Aseret Hoffman: Reeves Dam Treating Lenetta Piche/Extender: Skipper Cliche in Treatment: 18 Vital Signs Time Taken: 10:40 Temperature (F): 98.2 Height (in): 70 Pulse (bpm): 67 Weight (lbs): 277 Respiratory Rate (breaths/min): 18 Body Mass Index (BMI): 39.7 Blood Pressure (mmHg): 124/61 Reference Range: 80 - 120 mg / dl Electronic  Signature(s)  Signed: 08/13/2021 4:18:40 PM By: Levora Dredge Entered By: Levora Dredge on 08/13/2021 10:41:39

## 2021-08-13 NOTE — Progress Notes (Addendum)
Shane Bean, Shane Bean ([id number]) Visit Report for 08/13/2021 Chief Complaint Document Details Patient Name: Shane Bean, Shane Bean Date of Service: 08/13/2021 10:45 AM Medical Record Number: [id number] Patient Account Number: [id number] Date of Birth/Sex: 1955/08/16 (66 y.o. M) Treating RN: Levora Dredge Primary Care Provider: Reeves Dam Other Clinician: Referring Provider: Reeves Dam Treating Provider/Extender: Skipper Cliche in Treatment: 18 Information Obtained from: Patient Chief Complaint Right LE Ulcer Electronic Signature(s) Signed: 08/13/2021 10:56:24 AM By: Worthy Keeler PA-C Entered By: Worthy Keeler on 08/13/2021 10:56:23 Shane Bean ([id number]) -------------------------------------------------------------------------------- HPI Details Patient Name: Shane Bean Date of Service: 08/13/2021 10:45 AM Medical Record Number: [id number] Patient Account Number: [id number] Date of Birth/Sex: [birth year] (66 y.o. M) Treating RN: Levora Dredge Primary Care Provider: Reeves Dam Other Clinician: Referring Provider: Reeves Dam Treating Provider/Extender: Skipper Cliche in Treatment: 18 History of Present Illness HPI Description: 05/18/2020 upon evaluation today patient appears for initial evaluation here in clinic concerning issues that he has been having with the wound on his left lateral leg. Fortunately there does not appear to be any signs of obvious an active infection at this time which is great news. With that being said the patient unfortunately is continued to have issues with pain although he tells me it is gotten a lot better. He was on Keflex as well as Bactrim DS which seems to have done a good job there. His most recent hemoglobin A1c was 9.8 that was on 04/14/2020. Subsequently I do feel like that the patient is making progress here. He does have a history of chronic venous insufficiency as well as hypertension. I do not see any need for  antibiotics at this point. 5/25; follow-up of the wound on the left posterior calf. This is completely necrotic on the surface. It does not look infected but it is painful. He is a diabetic but I think there is some suggestion here of chronic venous disease as well. We used Iodoflex under compression last week 6/1; again a completely necrotic surface on this with the wound. We have been using Iodoflex under compression he complains of gnawing pain. He has had wounds previously a lot of this looks like chronic venous disease. 6/8; about two thirds of the surface of this wound is still covered in a black necrotic eschar. I changed him from Iodoflex to [place] last week because of complaints of pain. He actually was seen by her neurologist this weeko Peripheral neuropathy as a cause of the pain and they changed him from Neurontin to Lyrica but he still has not had any relief. He says that most of the pain however is in his heel not in his wound per se. He is a diabetic 6/15. This is a patient with what looks to be a venous wound on the left lateral lower leg. He is also a diabetic. Currently being worked up for diabetic neuropathy. He complains of pain out of proportion to the size of the wound although to be fair the entire area here was eschared. We have been working to get a viable surface. We are using Sorbact 6/22; fairly painful wound on the left posterior calf. I am assuming this is been venous he is also a diabetic. His ABI in our clinic was noncompressible however he has easily palpable pulses on his feet. He complains of a lot of pain in the wound also of the left heel and the upper left calf. Some of this may be neuropathy. It is led me to discontinue  Iodoflex reduce his compression but he still complains of pain in fact he says he could not take a debridement today. When I first saw this it was completely necrotic surface we have got it down to something that looks a reasonably healthy I  have been wondering about biopsying this area however he is on Coumadin and with the pain I have put this off. The major question would be an inflammatory ulcer such as pyoderma. The patient is not aware of how this started. He does however have a wound history on both legs. 6/29; patient presents for 1 week follow-up. He has been using Hydrofera Blue under Kerlix/Coban. He has tolerated this well. He currently denies signs of infection. He also declines debridement today. He he states it is painful and does not want to have this done. 7/6; Patient presents for 1 week follow-up. He has been using Hydrofera Blue under Kerlix/Coban. He denies signs of infection. He declines debridement today. 7/15; patient presents for 1 week follow-up. He has been using Hydrofera Blue under Kerlix/Coban. He denies signs of infection today. He is agreeable to having debridement done today. 7/20; patient presents for 1 week follow-up. He is in good spirits today. He has been using Hydrofera Blue under Kerlix/Coban. He denies signs of infection today. 7/27; patient presents for 1 week follow-up. He is in good spirits today. He has no issues or complaints today. He denies signs of infection. 08/12/2020 upon evaluation today patient appears to be doing better in regard to his wound. He has been tolerating the dressing changes without complication. With that being said he did have arterial studies and does show that he has a TBI on the right of 0.90 and a TBI on the left of 0.76 which is excellent. There is no evidence of arterial compromise at this point. 8/17; patient presents for 1 week follow-up. He has no issues or complaints today. He denies signs of infection. 8/24; patient presents for 1 week follow-up. He has no issues or complaints today. He denies signs of infection. He continues to tolerate the compression wrap well. 8/31; patient presents for 1 week follow-up. He has no issues or complaints today. He denies  signs of infection. He is in good spirits today. 9/7; patient presents for 1 week follow-up. He is tolerated the compression wrap well. He has no issues or complaints today. He denies signs of infection. 9/14; patient presents for 1 week follow-up. He has no issues or complaints today. He denies signs of infection. He is in excellent spirits today. 9/21; patient presents for 1 week follow-up. He has no issues or complaints today. 9/28; patient presents for 1 week follow-up. He has tolerated the compression wrap well. He has no issues or complaints today. 10/5; patient presents for 1 week follow-up. He has no issues or complaints today. 10/12; patient presents for 1 week follow-up. He has no issues or complaints today. He denies signs of infection. Shane Bean, Shane Bean ([id number]) Readmission: 04-08-2021 upon evaluation today patient appears to be doing poorly in regard to his right lower extremity. He is being seen for reevaluation here in the clinic he was last discharged healed in October 2022. Subsequently at that time it was noted in the chart that he was told to be wearing compression socks unfortunately the patient tells me he never remembers being told that. I did discuss this with him today as well as Hosie Poisson discussing it with him today again. Obviously he does know at this point once he  is healed he should be wearing compression socks all the time from the time he gets up in the morning to when he goes to sleep at night day in and day out. I think this is the only way that you can to keep things under control. Otherwise his medical history has not changed his arterial study in the past was excellent and did not appear to show any signs of issue at this point which is great news as well. 04-15-2021 upon evaluation today patient's wound is actually looking much drier than what it was last week. Again I do believe the infection is showing signs of improvement although he is having pain for sore I  think this has to do more with the dryness of the wound at this time as opposed to the infection that we were noted in the last week. Fortunately I do not see any evidence of active infection locally or systemically at this time which is great news. 04-22-2021 upon evaluation today patient unfortunately continues to have issues with his leg. This is getting significantly worse despite being on Augmentin. I am actually extremely concerned about worsening infection and especially considering the way the wound looks today and the odor present. 05-04-2021 upon evaluation today patient appears to be doing better with regard to the infection in his leg. He has been tolerating the dressing changes without complication and overall I am extremely pleased with where we stand. There does not appear to be any evidence of active infection locally or systemically which is great news. 05-13-2021 upon evaluation today patient appears to be doing better in regard to his leg ulcer. He has been tolerating the dressing changes without complication. He does have still significant pain he does have a lot of necrotic tissue which is working its way off and there is no signs of significant infection at this point which is great news. With that being said I do believe that the patient does need to likely continue with the wound care measures at this point with regard to the dressing that we are utilizing. 05-20-2021 upon evaluation today patient's wound is actually showing signs of fairly good granulation and epithelization in a lot of areas he still has some slough noted at this point though a lot of the eschar is softening up I do believe the Xeroform has been helpful in this regard. Fortunately I do not see any evidence of active infection locally or systemically which is great news. No fevers, chills, nausea, vomiting, or diarrhea. I do believe the 3 layer compression wrap has been beneficial. 05-27-2021 upon evaluation today  patient's wound is actually showing signs of significant improvement with a lot of new epithelial growth at this point. Fortunately I do not see any signs of active infection locally or systemically which is great news and overall I think we are headed in the right direction. 06-03-2021 upon evaluation today patient appears to be doing well with regard to his wounds although he still has a lot of slough and biofilm buildup noted. We are going to need to try to do some sharp debridement today to clear this away and he is in agreement with allowing me to try what we can. I am not certain how much working to be able to remove or not. 06-10-2021 upon evaluation today patient appears to be doing well with regard to his wound from the overall appearance and size standpoint this is good. From a drainage standpoint and the odor as well this  seems to still be infected in my opinion. I do not think the Augmentin was sufficient. With that being said I discussed this with the patient I do believe that he may benefit from topical [place] antibiotics in the meantime we will get a use gentamicin to bridge the gap until we get those. 06-17-2021 upon evaluation today patient appears to be doing better in regard to his leg. I have not been able to get him the [facility] as it was too expensive for the topical antibiotics. For that reason would been using gentamicin which I do feel like is helping and I do think that is probably can to be our best bet at this point all things considered. He is in agreement with giving this a trial and seeing how things go. Fortunately I see no evidence of active infection systemically locally some of the odor seems indicate there may be some bacteria present but I am not sure what is best to do past what we are already doing. 06-24-2021 upon evaluation today patient appears to be doing well with regard to his healing in fact he is got more epithelial growth than what we have seen previous.  Fortunately I think that each week I see him this is looking better and better. Fortunately I see no signs of active infection at this time locally or systemically which is great news. 07-01-2021 upon evaluation today patient appears to be doing excellent in regard to his leg ulcers. He has been tolerating the dressing changes without complication. Fortunately I see no evidence of active infection locally or systemically at this time which is great news. Overall I am extremely happy with where we stand today. 7/5; patient presents for follow-up. We have been using gentamicin and Xeroform under compression therapy. He has no issues or complaints today. 07-15-2021 upon evaluation today patient appears to be doing well with regard to his leg is almost completely healed. Fortunately I do not see any evidence of active infection locally or systemically which is great news. No fevers, chills, nausea, vomiting, or diarrhea. 07-22-2021 upon evaluation patient appears to be almost completely healed. I thought we were going to be there today but we are not quite so. Fortunately I do not see any evidence of active infection locally or systemically which is great news and overall I am extremely pleased with where we stand currently. 7/27; the patient's wounds on the right lower leg which are secondary to chronic venous insufficiency are healed. His edema control is excellent. He has 1 pair of 20/30 below-knee stockings from elastic therapy. Unfortunately he did not bring those in today however he is going straight home 08-05-2021 upon evaluation today patient appears to be doing well currently in regard to his wound. He has been tolerating the dressing changes without complication on the lateral aspect but unfortunately because he was not wearing his compression sock he actually has become swollen and has a blister that opened up on the medial aspect. We are going to need to train him a little bit better as far as  how to use the compression stocking. I do believe this is the right strength in the right size I do not think it is too tight for him but it is can be hard for him to get if he keeps trying to do it the same way he was. Shane Bean, Shane Bean ([id number]) 08-13-2021 upon evaluation today patient appears to be doing well currently in regard to his wound in fact he appears  to be almost completely healed though he tells me is still a little bit sore. For that reason we will get a monitor this for 1 more week to make sure everything does well before we completely discharge him. He does have compression socks for when ready however. Electronic Signature(s) Signed: 08/13/2021 11:35:44 AM By: Worthy Keeler PA-C Entered By: Worthy Keeler on 08/13/2021 11:35:44 Shane Bean ([id number]) -------------------------------------------------------------------------------- Physical Exam Details Patient Name: Shane Bean Date of Service: 08/13/2021 10:45 AM Medical Record Number: [id number] Patient Account Number: [id number] Date of Birth/Sex: 12-08-1955 (65 y.o. M) Treating RN: Levora Dredge Primary Care Provider: Reeves Dam Other Clinician: Referring Provider: Reeves Dam Treating Provider/Extender: Jeri Cos Weeks in Treatment: 78 Constitutional Well-nourished and well-hydrated in no acute distress. Respiratory normal breathing without difficulty. Psychiatric this patient is able to make decisions and demonstrates good insight into disease process. Alert and Oriented x 3. pleasant and cooperative. Notes Upon inspection patient's wound bed actually showed signs of good granulation and epithelization at this point. Fortunately I do not see any evidence of active infection locally nor systemically at this time which is great news. No fevers, chills, nausea, vomiting, or diarrhea. Electronic Signature(s) Signed: 08/13/2021 11:36:04 AM By: Worthy Keeler PA-C Entered By: Worthy Keeler on 08/13/2021 11:36:04 Shane Bean ([id number]) -------------------------------------------------------------------------------- Physician Orders Details Patient Name: Shane Bean Date of Service: 08/13/2021 10:45 AM Medical Record Number: [id number] Patient Account Number: [id number] Date of Birth/Sex: May 31, 1955 (65 y.o. M) Treating RN: Levora Dredge Primary Care Provider: Reeves Dam Other Clinician: Referring Provider: Reeves Dam Treating Provider/Extender: Skipper Cliche in Treatment: 18 Verbal / Phone Orders: No Diagnosis Coding ICD-10 Coding Code Description E11.622 Type 2 diabetes mellitus with other skin ulcer I87.2 Venous insufficiency (chronic) (peripheral) 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 layer exposed L97.812 Non-pressure chronic ulcer of other part of right lower leg with fat layer exposed I10 Essential (primary) hypertension Follow-up Appointments o Return Appointment in 1 week. Bathing/ Shower/ Hygiene o May shower with wound dressing protected with water repellent cover or cast protector. o No tub bath. Edema Control - Lymphedema / Segmental Compressive Device / Other o Optional: One layer of unna paste to top of compression wrap (to act as an anchor). o Elevate, Exercise Daily and Avoid Standing for Long Periods of Time. o Elevate leg(s) parallel to the floor when sitting. o DO YOUR BEST to sleep in the bed at night. DO NOT sleep in your recliner. Long hours of sitting in a recliner leads to swelling of the legs and/or potential wounds on your backside. Wound Treatment Wound #4 - Lower Leg Wound Laterality: Right, Medial Cleanser: Soap and Water 1 x Per Week/15 Days Discharge Instructions: Gently cleanse wound with antibacterial soap, rinse and pat dry prior to dressing wounds Primary Dressing: Curad Oil Emulsion Dressing 3x3 (in/in) 1 x Per Week/15 Days Secondary Dressing: ABD  Pad 5x9 (in/in) 1 x Per Week/15 Days Discharge Instructions: Cover with ABD pad Compression Wrap: 3-LAYER WRAP - Profore Lite LF 3 Multilayer Compression Bandaging System 1 x Per Week/15 Days Discharge Instructions: Apply 3 multi-layer wrap as prescribed. Electronic Signature(s) Signed: 08/13/2021 4:18:40 PM By: Levora Dredge Signed: 08/13/2021 5:04:29 PM By: Worthy Keeler PA-C Entered By: Levora Dredge on 08/13/2021 11:01:00 Shane Bean ([id number]) -------------------------------------------------------------------------------- Problem List Details Patient Name: Shane Bean Date of Service: 08/13/2021 10:45 AM Medical Record Number: [id number] Patient Account Number: [id number] Date of  Birth/Sex: 09-16-1955 (66 y.o. M) Treating RN: Levora Dredge Primary Care Provider: Reeves Dam Other Clinician: Referring Provider: Reeves Dam Treating Provider/Extender: Skipper Cliche in Treatment: 18 Active Problems ICD-10 Encounter Code Description Active Date MDM Diagnosis E11.622 Type 2 diabetes mellitus with other skin ulcer 04/08/2021 No Yes I87.2 Venous insufficiency (chronic) (peripheral) 04/08/2021 No Yes 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 layer 04/08/2021 No Yes exposed L97.812 Non-pressure chronic ulcer of other part of right lower leg with fat layer 04/08/2021 No Yes exposed [place] (primary) hypertension 04/08/2021 No Yes Inactive Problems Resolved Problems Electronic Signature(s) Signed: 08/13/2021 10:56:19 AM By: Worthy Keeler PA-C Entered By: Worthy Keeler on 08/13/2021 10:56:19 Shane Bean ([id number]) -------------------------------------------------------------------------------- Progress Note Details Patient Name: Shane Bean Date of Service: 08/13/2021 10:45 AM Medical Record Number: [id number] Patient Account Number: [id number] Date of Birth/Sex: 1955-04-08 (65 y.o. M) Treating RN:  Levora Dredge Primary Care Provider: Reeves Dam Other Clinician: Referring Provider: Reeves Dam Treating Provider/Extender: Skipper Cliche in Treatment: 18 Subjective Chief Complaint Information obtained from Patient Right LE Ulcer History of Present Illness (HPI) 05/18/2020 upon evaluation today patient appears for initial evaluation here in clinic concerning issues that he has been having with the wound on his left lateral leg. Fortunately there does not appear to be any signs of obvious an active infection at this time which is great news. With that being said the patient unfortunately is continued to have issues with pain although he tells me it is gotten a lot better. He was on Keflex as well as Bactrim DS which seems to have done a good job there. His most recent hemoglobin A1c was 9.8 that was on 04/14/2020. Subsequently I do feel like that the patient is making progress here. He does have a history of chronic venous insufficiency as well as hypertension. I do not see any need for antibiotics at this point. 5/25; follow-up of the wound on the left posterior calf. This is completely necrotic on the surface. It does not look infected but it is painful. He is a diabetic but I think there is some suggestion here of chronic venous disease as well. We used Iodoflex under compression last week 6/1; again a completely necrotic surface on this with the wound. We have been using Iodoflex under compression he complains of gnawing pain. He has had wounds previously a lot of this looks like chronic venous disease. 6/8; about two thirds of the surface of this wound is still covered in a black necrotic eschar. I changed him from Iodoflex to [place] last week because of complaints of pain. He actually was seen by her neurologist this weeko Peripheral neuropathy as a cause of the pain and they changed him from Neurontin to Lyrica but he still has not had any relief. He says that most of the pain  however is in his heel not in his wound per se. He is a diabetic 6/15. This is a patient with what looks to be a venous wound on the left lateral lower leg. He is also a diabetic. Currently being worked up for diabetic neuropathy. He complains of pain out of proportion to the size of the wound although to be fair the entire area here was eschared. We have been working to get a viable surface. We are using Sorbact 6/22; fairly painful wound on the left posterior calf. I am assuming this is been venous he is also a diabetic. His ABI  in our clinic was noncompressible however he has easily palpable pulses on his feet. He complains of a lot of pain in the wound also of the left heel and the upper left calf. Some of this may be neuropathy. It is led me to discontinue Iodoflex reduce his compression but he still complains of pain in fact he says he could not take a debridement today. When I first saw this it was completely necrotic surface we have got it down to something that looks a reasonably healthy I have been wondering about biopsying this area however he is on Coumadin and with the pain I have put this off. The major question would be an inflammatory ulcer such as pyoderma. The patient is not aware of how this started. He does however have a wound history on both legs. 6/29; patient presents for 1 week follow-up. He has been using Hydrofera Blue under Kerlix/Coban. He has tolerated this well. He currently denies signs of infection. He also declines debridement today. He he states it is painful and does not want to have this done. 7/6; Patient presents for 1 week follow-up. He has been using Hydrofera Blue under Kerlix/Coban. He denies signs of infection. He declines debridement today. 7/15; patient presents for 1 week follow-up. He has been using Hydrofera Blue under Kerlix/Coban. He denies signs of infection today. He is agreeable to having debridement done today. 7/20; patient presents for 1 week  follow-up. He is in good spirits today. He has been using Hydrofera Blue under Kerlix/Coban. He denies signs of infection today. 7/27; patient presents for 1 week follow-up. He is in good spirits today. He has no issues or complaints today. He denies signs of infection. 08/12/2020 upon evaluation today patient appears to be doing better in regard to his wound. He has been tolerating the dressing changes without complication. With that being said he did have arterial studies and does show that he has a TBI on the right of 0.90 and a TBI on the left of 0.76 which is excellent. There is no evidence of arterial compromise at this point. 8/17; patient presents for 1 week follow-up. He has no issues or complaints today. He denies signs of infection. 8/24; patient presents for 1 week follow-up. He has no issues or complaints today. He denies signs of infection. He continues to tolerate the compression wrap well. 8/31; patient presents for 1 week follow-up. He has no issues or complaints today. He denies signs of infection. He is in good spirits today. 9/7; patient presents for 1 week follow-up. He is tolerated the compression wrap well. He has no issues or complaints today. He denies signs of infection. 9/14; patient presents for 1 week follow-up. He has no issues or complaints today. He denies signs of infection. He is in excellent spirits today. 9/21; patient presents for 1 week follow-up. He has no issues or complaints today. Shane Bean, Shane Bean ([id number]) 9/28; patient presents for 1 week follow-up. He has tolerated the compression wrap well. He has no issues or complaints today. 10/5; patient presents for 1 week follow-up. He has no issues or complaints today. 10/12; patient presents for 1 week follow-up. He has no issues or complaints today. He denies signs of infection. Readmission: 04-08-2021 upon evaluation today patient appears to be doing poorly in regard to his right lower extremity. He is  being seen for reevaluation here in the clinic he was last discharged healed in October 2022. Subsequently at that time it was noted in the chart  that he was told to be wearing compression socks unfortunately the patient tells me he never remembers being told that. I did discuss this with him today as well as Hosie Poisson discussing it with him today again. Obviously he does know at this point once he is healed he should be wearing compression socks all the time from the time he gets up in the morning to when he goes to sleep at night day in and day out. I think this is the only way that you can to keep things under control. Otherwise his medical history has not changed his arterial study in the past was excellent and did not appear to show any signs of issue at this point which is great news as well. 04-15-2021 upon evaluation today patient's wound is actually looking much drier than what it was last week. Again I do believe the infection is showing signs of improvement although he is having pain for sore I think this has to do more with the dryness of the wound at this time as opposed to the infection that we were noted in the last week. Fortunately I do not see any evidence of active infection locally or systemically at this time which is great news. 04-22-2021 upon evaluation today patient unfortunately continues to have issues with his leg. This is getting significantly worse despite being on Augmentin. I am actually extremely concerned about worsening infection and especially considering the way the wound looks today and the odor present. 05-04-2021 upon evaluation today patient appears to be doing better with regard to the infection in his leg. He has been tolerating the dressing changes without complication and overall I am extremely pleased with where we stand. There does not appear to be any evidence of active infection locally or systemically which is great news. 05-13-2021 upon evaluation today  patient appears to be doing better in regard to his leg ulcer. He has been tolerating the dressing changes without complication. He does have still significant pain he does have a lot of necrotic tissue which is working its way off and there is no signs of significant infection at this point which is great news. With that being said I do believe that the patient does need to likely continue with the wound care measures at this point with regard to the dressing that we are utilizing. 05-20-2021 upon evaluation today patient's wound is actually showing signs of fairly good granulation and epithelization in a lot of areas he still has some slough noted at this point though a lot of the eschar is softening up I do believe the Xeroform has been helpful in this regard. Fortunately I do not see any evidence of active infection locally or systemically which is great news. No fevers, chills, nausea, vomiting, or diarrhea. I do believe the 3 layer compression wrap has been beneficial. 05-27-2021 upon evaluation today patient's wound is actually showing signs of significant improvement with a lot of new epithelial growth at this point. Fortunately I do not see any signs of active infection locally or systemically which is great news and overall I think we are headed in the right direction. 06-03-2021 upon evaluation today patient appears to be doing well with regard to his wounds although he still has a lot of slough and biofilm buildup noted. We are going to need to try to do some sharp debridement today to clear this away and he is in agreement with allowing me to try what we can. I am not certain  how much working to be able to remove or not. 06-10-2021 upon evaluation today patient appears to be doing well with regard to his wound from the overall appearance and size standpoint this is good. From a drainage standpoint and the odor as well this seems to still be infected in my opinion. I do not think the Augmentin  was sufficient. With that being said I discussed this with the patient I do believe that he may benefit from topical [place] antibiotics in the meantime we will get a use gentamicin to bridge the gap until we get those. 06-17-2021 upon evaluation today patient appears to be doing better in regard to his leg. I have not been able to get him the [facility] as it was too expensive for the topical antibiotics. For that reason would been using gentamicin which I do feel like is helping and I do think that is probably can to be our best bet at this point all things considered. He is in agreement with giving this a trial and seeing how things go. Fortunately I see no evidence of active infection systemically locally some of the odor seems indicate there may be some bacteria present but I am not sure what is best to do past what we are already doing. 06-24-2021 upon evaluation today patient appears to be doing well with regard to his healing in fact he is got more epithelial growth than what we have seen previous. Fortunately I think that each week I see him this is looking better and better. Fortunately I see no signs of active infection at this time locally or systemically which is great news. 07-01-2021 upon evaluation today patient appears to be doing excellent in regard to his leg ulcers. He has been tolerating the dressing changes without complication. Fortunately I see no evidence of active infection locally or systemically at this time which is great news. Overall I am extremely happy with where we stand today. 7/5; patient presents for follow-up. We have been using gentamicin and Xeroform under compression therapy. He has no issues or complaints today. 07-15-2021 upon evaluation today patient appears to be doing well with regard to his leg is almost completely healed. Fortunately I do not see any evidence of active infection locally or systemically which is great news. No fevers, chills, nausea,  vomiting, or diarrhea. 07-22-2021 upon evaluation patient appears to be almost completely healed. I thought we were going to be there today but we are not quite so. Fortunately I do not see any evidence of active infection locally or systemically which is great news and overall I am extremely pleased with where we stand currently. 7/27; the patient's wounds on the right lower leg which are secondary to chronic venous insufficiency are healed. His edema control is excellent. He has 1 pair of 20/30 below-knee stockings from elastic therapy. Unfortunately he did not bring those in today however he is going straight home Shane Bean, Shane V. ([id number]) 08-05-2021 upon evaluation today patient appears to be doing well currently in regard to his wound. He has been tolerating the dressing changes without complication on the lateral aspect but unfortunately because he was not wearing his compression sock he actually has become swollen and has a blister that opened up on the medial aspect. We are going to need to train him a little bit better as far as how to use the compression stocking. I do believe this is the right strength in the right size I do not think it is too  tight for him but it is can be hard for him to get if he keeps trying to do it the same way he was. 08-13-2021 upon evaluation today patient appears to be doing well currently in regard to his wound in fact he appears to be almost completely healed though he tells me is still a little bit sore. For that reason we will get a monitor this for 1 more week to make sure everything does well before we completely discharge him. He does have compression socks for when ready however. Objective Constitutional Well-nourished and well-hydrated in no acute distress. Vitals Time Taken: 10:40 AM, Height: 70 in, Weight: 277 lbs, BMI: 39.7, Temperature: 98.2 F, Pulse: 67 bpm, Respiratory Rate: 18 breaths/min, Blood Pressure: 124/61 mmHg. Respiratory normal  breathing without difficulty. Psychiatric this patient is able to make decisions and demonstrates good insight into disease process. Alert and Oriented x 3. pleasant and cooperative. General Notes: Upon inspection patient's wound bed actually showed signs of good granulation and epithelization at this point. Fortunately I do not see any evidence of active infection locally nor systemically at this time which is great news. No fevers, chills, nausea, vomiting, or diarrhea. Integumentary (Hair, Skin) Wound #4 status is Open. Original cause of wound was Blister. The date acquired was: 07/30/2021. The wound has been in treatment 1 weeks. The wound is located on the Right,Medial Lower Leg. The wound measures 0.1cm length x 0.1cm width x 0.1cm depth; 0.008cm^2 area and 0.001cm^3 volume. There is no tunneling or undermining noted. There is a none present amount of drainage noted. There is no granulation within the wound bed. There is no necrotic tissue within the wound bed. Assessment Active Problems ICD-10 Type 2 diabetes mellitus with other skin ulcer Venous insufficiency (chronic) (peripheral) Non-pressure chronic ulcer of other part of left lower leg with fat layer exposed Non-pressure chronic ulcer of other part of right lower leg with fat layer exposed Essential (primary) hypertension Procedures Wound #4 Pre-procedure diagnosis of Wound #4 is a Diabetic Wound/Ulcer of the Lower Extremity located on the Right,Medial Lower Leg . There was a Three Layer Compression Therapy Procedure by Levora Dredge, RN. Post procedure Diagnosis Wound #4: Same as Pre-Procedure Notes: pt tolerated wrap without issue. Shane Bean, Shane Bean ([id number]) Plan Follow-up Appointments: Return Appointment in 1 week. Bathing/ Shower/ Hygiene: May shower with wound dressing protected with water repellent cover or cast protector. No tub bath. Edema Control - Lymphedema / Segmental Compressive Device / Other: Optional:  One layer of unna paste to top of compression wrap (to act as an anchor). Elevate, Exercise Daily and Avoid Standing for Long Periods of Time. Elevate leg(s) parallel to the floor when sitting. DO YOUR BEST to sleep in the bed at night. DO NOT sleep in your recliner. Long hours of sitting in a recliner leads to swelling of the legs and/or potential wounds on your backside. WOUND #4: - Lower Leg Wound Laterality: Right, Medial Cleanser: Soap and Water 1 x Per Week/15 Days Discharge Instructions: Gently cleanse wound with antibacterial soap, rinse and pat dry prior to dressing wounds Primary Dressing: Curad Oil Emulsion Dressing 3x3 (in/in) 1 x Per Week/15 Days Secondary Dressing: ABD Pad 5x9 (in/in) 1 x Per Week/15 Days Discharge Instructions: Cover with ABD pad Compression Wrap: 3-LAYER WRAP - Profore Lite LF 3 Multilayer Compression Bandaging System 1 x Per Week/15 Days Discharge Instructions: Apply 3 multi-layer wrap as prescribed. 1. I am going to suggest based on what I am seeing currently that  we go ahead and continue with the wound care measures as before and the patient is in agreement with plan. This includes the use of the 3 layer compression wrap. 2. We do not need the silver cell but I am get a use a little bit of the oil emulsion gauze to make sure nothing sticks to the area where this wound is healed. 3. I would also recommend he continue to elevate his legs much as possible. We will see patient back for reevaluation in 1 week here in the clinic. If anything worsens or changes patient will contact our office for additional recommendations. Electronic Signature(s) Signed: 08/13/2021 11:36:32 AM By: Worthy Keeler PA-C Entered By: Worthy Keeler on 08/13/2021 11:36:32 Shane Bean ([id number]) -------------------------------------------------------------------------------- SuperBill Details Patient Name: Shane Bean Date of Service: 08/13/2021 Medical Record  Number: [id number] Patient Account Number: [id number] Date of Birth/Sex: November 10, 1955 (65 y.o. M) Treating RN: Levora Dredge Primary Care Provider: Reeves Dam Other Clinician: Referring Provider: Reeves Dam Treating Provider/Extender: Jeri Cos Weeks in Treatment: 18 Diagnosis Coding ICD-10 Codes Code Description E11.622 Type 2 diabetes mellitus with other skin ulcer I87.2 Venous insufficiency (chronic) (peripheral) 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 layer exposed L97.812 Non-pressure chronic ulcer of other part of right lower leg with fat layer exposed [place] (primary) hypertension Facility Procedures CPT4 Code: 78295621 Description: (Facility Use Only) [phone] - APPLY MULTLAY COMPRS LWR RT LEG Modifier: Quantity: 1 Physician Procedures CPT4 Code: 4696295 Description: 28413 - WC PHYS LEVEL 3 - EST PT Modifier: Quantity: 1 CPT4 Code: Description: ICD-10 Diagnosis Description E11.622 Type 2 diabetes mellitus with other skin ulcer I87.2 Venous insufficiency (chronic) (peripheral) 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 lay L97.812 Non-pressure  chronic ulcer of other part of right lower leg with fat la Modifier: er exposed yer exposed Quantity: Electronic Signature(s) Signed: 08/13/2021 11:40:38 AM By: Worthy Keeler PA-C Entered By: Worthy Keeler on 08/13/2021 11:40:37

## 2021-08-19 ENCOUNTER — Encounter: Payer: Medicare (Managed Care) | Admitting: Physician Assistant

## 2021-08-19 DIAGNOSIS — E11622 Type 2 diabetes mellitus with other skin ulcer: Secondary | ICD-10-CM | POA: Diagnosis not present

## 2021-08-21 NOTE — Progress Notes (Addendum)
DAYLON, LAFAVOR ([id number]) Visit Report for 08/19/2021 Chief Complaint Document Details Patient Name: Shane Bean, Shane Bean Date of Service: 08/19/2021 10:45 AM Medical Record Number: [id number] Patient Account Number: [id number] Date of Birth/Sex: 22-Aug-1955 (66 y.o. M) Treating RN: Cornell Barman Primary Care Provider: Reeves Dam Other Clinician: Referring Provider: Reeves Dam Treating Provider/Extender: Skipper Cliche in Treatment: 19 Information Obtained from: Patient Chief Complaint Right LE Ulcer Electronic Signature(s) Signed: 08/19/2021 11:27:41 AM By: Worthy Keeler PA-C Entered By: Worthy Keeler on 08/19/2021 11:27:40 Shane Bean ([id number]) -------------------------------------------------------------------------------- HPI Details Patient Name: Shane Bean Date of Service: 08/19/2021 10:45 AM Medical Record Number: [id number] Patient Account Number: [id number] Date of Birth/Sex: 03/12/1955 (65 y.o. M) Treating RN: Cornell Barman Primary Care Provider: Reeves Dam Other Clinician: Referring Provider: Reeves Dam Treating Provider/Extender: Skipper Cliche in Treatment: 19 History of Present Illness HPI Description: 05/18/2020 upon evaluation today patient appears for initial evaluation here in clinic concerning issues that he has been having with the wound on his left lateral leg. Fortunately there does not appear to be any signs of obvious an active infection at this time which is great news. With that being said the patient unfortunately is continued to have issues with pain although he tells me it is gotten a lot better. He was on Keflex as well as Bactrim DS which seems to have done a good job there. His most recent hemoglobin A1c was 9.8 that was on 04/14/2020. Subsequently I do feel like that the patient is making progress here. He does have a history of chronic venous insufficiency as well as hypertension. I do not see any need for antibiotics  at this point. 5/25; follow-up of the wound on the left posterior calf. This is completely necrotic on the surface. It does not look infected but it is painful. He is a diabetic but I think there is some suggestion here of chronic venous disease as well. We used Iodoflex under compression last week 6/1; again a completely necrotic surface on this with the wound. We have been using Iodoflex under compression he complains of gnawing pain. He has had wounds previously a lot of this looks like chronic venous disease. 6/8; about two thirds of the surface of this wound is still covered in a black necrotic eschar. I changed him from Iodoflex to [place] last week because of complaints of pain. He actually was seen by her neurologist this weeko Peripheral neuropathy as a cause of the pain and they changed him from Neurontin to Lyrica but he still has not had any relief. He says that most of the pain however is in his heel not in his wound per se. He is a diabetic 6/15. This is a patient with what looks to be a venous wound on the left lateral lower leg. He is also a diabetic. Currently being worked up for diabetic neuropathy. He complains of pain out of proportion to the size of the wound although to be fair the entire area here was eschared. We have been working to get a viable surface. We are using Sorbact 6/22; fairly painful wound on the left posterior calf. I am assuming this is been venous he is also a diabetic. His ABI in our clinic was noncompressible however he has easily palpable pulses on his feet. He complains of a lot of pain in the wound also of the left heel and the upper left calf. Some of this may be neuropathy. It is led me to discontinue  Iodoflex reduce his compression but he still complains of pain in fact he says he could not take a debridement today. When I first saw this it was completely necrotic surface we have got it down to something that looks a reasonably healthy I have been  wondering about biopsying this area however he is on Coumadin and with the pain I have put this off. The major question would be an inflammatory ulcer such as pyoderma. The patient is not aware of how this started. He does however have a wound history on both legs. 6/29; patient presents for 1 week follow-up. He has been using Hydrofera Blue under Kerlix/Coban. He has tolerated this well. He currently denies signs of infection. He also declines debridement today. He he states it is painful and does not want to have this done. 7/6; Patient presents for 1 week follow-up. He has been using Hydrofera Blue under Kerlix/Coban. He denies signs of infection. He declines debridement today. 7/15; patient presents for 1 week follow-up. He has been using Hydrofera Blue under Kerlix/Coban. He denies signs of infection today. He is agreeable to having debridement done today. 7/20; patient presents for 1 week follow-up. He is in good spirits today. He has been using Hydrofera Blue under Kerlix/Coban. He denies signs of infection today. 7/27; patient presents for 1 week follow-up. He is in good spirits today. He has no issues or complaints today. He denies signs of infection. 08/12/2020 upon evaluation today patient appears to be doing better in regard to his wound. He has been tolerating the dressing changes without complication. With that being said he did have arterial studies and does show that he has a TBI on the right of 0.90 and a TBI on the left of 0.76 which is excellent. There is no evidence of arterial compromise at this point. 8/17; patient presents for 1 week follow-up. He has no issues or complaints today. He denies signs of infection. 8/24; patient presents for 1 week follow-up. He has no issues or complaints today. He denies signs of infection. He continues to tolerate the compression wrap well. 8/31; patient presents for 1 week follow-up. He has no issues or complaints today. He denies signs of  infection. He is in good spirits today. 9/7; patient presents for 1 week follow-up. He is tolerated the compression wrap well. He has no issues or complaints today. He denies signs of infection. 9/14; patient presents for 1 week follow-up. He has no issues or complaints today. He denies signs of infection. He is in excellent spirits today. 9/21; patient presents for 1 week follow-up. He has no issues or complaints today. 9/28; patient presents for 1 week follow-up. He has tolerated the compression wrap well. He has no issues or complaints today. 10/5; patient presents for 1 week follow-up. He has no issues or complaints today. 10/12; patient presents for 1 week follow-up. He has no issues or complaints today. He denies signs of infection. Shane Bean, Shane Bean ([id number]) Readmission: 04-08-2021 upon evaluation today patient appears to be doing poorly in regard to his right lower extremity. He is being seen for reevaluation here in the clinic he was last discharged healed in October 2022. Subsequently at that time it was noted in the chart that he was told to be wearing compression socks unfortunately the patient tells me he never remembers being told that. I did discuss this with him today as well as Hosie Poisson discussing it with him today again. Obviously he does know at this point once he  is healed he should be wearing compression socks all the time from the time he gets up in the morning to when he goes to sleep at night day in and day out. I think this is the only way that you can to keep things under control. Otherwise his medical history has not changed his arterial study in the past was excellent and did not appear to show any signs of issue at this point which is great news as well. 04-15-2021 upon evaluation today patient's wound is actually looking much drier than what it was last week. Again I do believe the infection is showing signs of improvement although he is having pain for sore I think  this has to do more with the dryness of the wound at this time as opposed to the infection that we were noted in the last week. Fortunately I do not see any evidence of active infection locally or systemically at this time which is great news. 04-22-2021 upon evaluation today patient unfortunately continues to have issues with his leg. This is getting significantly worse despite being on Augmentin. I am actually extremely concerned about worsening infection and especially considering the way the wound looks today and the odor present. 05-04-2021 upon evaluation today patient appears to be doing better with regard to the infection in his leg. He has been tolerating the dressing changes without complication and overall I am extremely pleased with where we stand. There does not appear to be any evidence of active infection locally or systemically which is great news. 05-13-2021 upon evaluation today patient appears to be doing better in regard to his leg ulcer. He has been tolerating the dressing changes without complication. He does have still significant pain he does have a lot of necrotic tissue which is working its way off and there is no signs of significant infection at this point which is great news. With that being said I do believe that the patient does need to likely continue with the wound care measures at this point with regard to the dressing that we are utilizing. 05-20-2021 upon evaluation today patient's wound is actually showing signs of fairly good granulation and epithelization in a lot of areas he still has some slough noted at this point though a lot of the eschar is softening up I do believe the Xeroform has been helpful in this regard. Fortunately I do not see any evidence of active infection locally or systemically which is great news. No fevers, chills, nausea, vomiting, or diarrhea. I do believe the 3 layer compression wrap has been beneficial. 05-27-2021 upon evaluation today  patient's wound is actually showing signs of significant improvement with a lot of new epithelial growth at this point. Fortunately I do not see any signs of active infection locally or systemically which is great news and overall I think we are headed in the right direction. 06-03-2021 upon evaluation today patient appears to be doing well with regard to his wounds although he still has a lot of slough and biofilm buildup noted. We are going to need to try to do some sharp debridement today to clear this away and he is in agreement with allowing me to try what we can. I am not certain how much working to be able to remove or not. 06-10-2021 upon evaluation today patient appears to be doing well with regard to his wound from the overall appearance and size standpoint this is good. From a drainage standpoint and the odor as well this  seems to still be infected in my opinion. I do not think the Augmentin was sufficient. With that being said I discussed this with the patient I do believe that he may benefit from topical [place] antibiotics in the meantime we will get a use gentamicin to bridge the gap until we get those. 06-17-2021 upon evaluation today patient appears to be doing better in regard to his leg. I have not been able to get him the [facility] as it was too expensive for the topical antibiotics. For that reason would been using gentamicin which I do feel like is helping and I do think that is probably can to be our best bet at this point all things considered. He is in agreement with giving this a trial and seeing how things go. Fortunately I see no evidence of active infection systemically locally some of the odor seems indicate there may be some bacteria present but I am not sure what is best to do past what we are already doing. 06-24-2021 upon evaluation today patient appears to be doing well with regard to his healing in fact he is got more epithelial growth than what we have seen previous.  Fortunately I think that each week I see him this is looking better and better. Fortunately I see no signs of active infection at this time locally or systemically which is great news. 07-01-2021 upon evaluation today patient appears to be doing excellent in regard to his leg ulcers. He has been tolerating the dressing changes without complication. Fortunately I see no evidence of active infection locally or systemically at this time which is great news. Overall I am extremely happy with where we stand today. 7/5; patient presents for follow-up. We have been using gentamicin and Xeroform under compression therapy. He has no issues or complaints today. 07-15-2021 upon evaluation today patient appears to be doing well with regard to his leg is almost completely healed. Fortunately I do not see any evidence of active infection locally or systemically which is great news. No fevers, chills, nausea, vomiting, or diarrhea. 07-22-2021 upon evaluation patient appears to be almost completely healed. I thought we were going to be there today but we are not quite so. Fortunately I do not see any evidence of active infection locally or systemically which is great news and overall I am extremely pleased with where we stand currently. 7/27; the patient's wounds on the right lower leg which are secondary to chronic venous insufficiency are healed. His edema control is excellent. He has 1 pair of 20/30 below-knee stockings from elastic therapy. Unfortunately he did not bring those in today however he is going straight home 08-05-2021 upon evaluation today patient appears to be doing well currently in regard to his wound. He has been tolerating the dressing changes without complication on the lateral aspect but unfortunately because he was not wearing his compression sock he actually has become swollen and has a blister that opened up on the medial aspect. We are going to need to train him a little bit better as far as  how to use the compression stocking. I do believe this is the right strength in the right size I do not think it is too tight for him but it is can be hard for him to get if he keeps trying to do it the same way he was. Shane Bean, Shane Bean ([id number]) 08-13-2021 upon evaluation today patient appears to be doing well currently in regard to his wound in fact he appears  to be almost completely healed though he tells me is still a little bit sore. For that reason we will get a monitor this for 1 more week to make sure everything does well before we completely discharge him. He does have compression socks for when ready however. 08-19-2021 upon evaluation today patient appears to be doing well currently in regard to his wound. He has been tolerating the dressing changes without complication. Fortunately I do not see any evidence of active infection in fact this appears to be completely healed. Electronic Signature(s) Signed: 08/19/2021 3:23:53 PM By: Worthy Keeler PA-C Entered By: Worthy Keeler on 08/19/2021 15:23:53 Shane Bean ([id number]) -------------------------------------------------------------------------------- Physical Exam Details Patient Name: Shane Bean Date of Service: 08/19/2021 10:45 AM Medical Record Number: [id number] Patient Account Number: [id number] Date of Birth/Sex: 02/22/1955 (65 y.o. M) Treating RN: Cornell Barman Primary Care Provider: Reeves Dam Other Clinician: Referring Provider: Reeves Dam Treating Provider/Extender: Jeri Cos Weeks in Treatment: 18 Constitutional Well-nourished and well-hydrated in no acute distress. Respiratory normal breathing without difficulty. Psychiatric this patient is able to make decisions and demonstrates good insight into disease process. Alert and Oriented x 3. pleasant and cooperative. Notes Upon evaluation today patient's wound bed actually showed signs of good granulation and epithelization at this point.  Fortunately there does not appear to be any signs of infection which is great news and overall I am extremely pleased with where things stand currently. Electronic Signature(s) Signed: 08/19/2021 3:24:07 PM By: Worthy Keeler PA-C Entered By: Worthy Keeler on 08/19/2021 15:24:07 Shane Bean ([id number]) -------------------------------------------------------------------------------- Physician Orders Details Patient Name: Shane Bean Date of Service: 08/19/2021 10:45 AM Medical Record Number: [id number] Patient Account Number: [id number] Date of Birth/Sex: Dec 10, 1955 (65 y.o. M) Treating RN: Cornell Barman Primary Care Provider: Reeves Dam Other Clinician: Referring Provider: Reeves Dam Treating Provider/Extender: Skipper Cliche in Treatment: 19 Verbal / Phone Orders: No Diagnosis Coding ICD-10 Coding Code Description E11.622 Type 2 diabetes mellitus with other skin ulcer I87.2 Venous insufficiency (chronic) (peripheral) 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 layer exposed L97.812 Non-pressure chronic ulcer of other part of right lower leg with fat layer exposed I10 Essential (primary) hypertension Discharge From [facility] o Discharge from [place] [facility] o Wear compression garments daily. Put garments on first thing when you wake up and remove them before bed. o Moisturize legs daily after removing compression garments. o Elevate, Exercise Daily and Avoid Standing for Long Periods of Time. Electronic Signature(s) Signed: 08/19/2021 5:15:24 PM By: Gretta Cool, BSN, RN, CWS, Kim RN, BSN Signed: 08/19/2021 5:21:19 PM By: Worthy Keeler PA-C Entered By: Gretta Cool BSN, RN, CWS, Kim on 08/19/2021 17:15:24 Shane Bean, Shane Bean ([id number]) -------------------------------------------------------------------------------- Problem List Details Patient Name: Shane Bean Date of Service: 08/19/2021 10:45  AM Medical Record Number: [id number] Patient Account Number: [id number] Date of Birth/Sex: 1955/05/12 (65 y.o. M) Treating RN: Cornell Barman Primary Care Provider: Reeves Dam Other Clinician: Referring Provider: Reeves Dam Treating Provider/Extender: Skipper Cliche in Treatment: 19 Active Problems ICD-10 Encounter Code Description Active Date MDM Diagnosis E11.622 Type 2 diabetes mellitus with other skin ulcer 04/08/2021 No Yes I87.2 Venous insufficiency (chronic) (peripheral) 04/08/2021 No Yes 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 layer 04/08/2021 No Yes exposed L97.812 Non-pressure chronic ulcer of other part of right lower leg with fat layer 04/08/2021 No Yes exposed [place] (primary) hypertension 04/08/2021 No Yes Inactive Problems  Resolved Problems Electronic Signature(s) Signed: 08/19/2021 11:27:37 AM By: Worthy Keeler PA-C Entered By: Worthy Keeler on 08/19/2021 11:27:37 Shane Bean ([id number]) -------------------------------------------------------------------------------- Progress Note Details Patient Name: Shane Bean Date of Service: 08/19/2021 10:45 AM Medical Record Number: [id number] Patient Account Number: [id number] Date of Birth/Sex: [birth year] (65 y.o. M) Treating RN: Cornell Barman Primary Care Provider: Reeves Dam Other Clinician: Referring Provider: Reeves Dam Treating Provider/Extender: Skipper Cliche in Treatment: 19 Subjective Chief Complaint Information obtained from Patient Right LE Ulcer History of Present Illness (HPI) 05/18/2020 upon evaluation today patient appears for initial evaluation here in clinic concerning issues that he has been having with the wound on his left lateral leg. Fortunately there does not appear to be any signs of obvious an active infection at this time which is great news. With that being said the patient unfortunately is continued to have issues with pain  although he tells me it is gotten a lot better. He was on Keflex as well as Bactrim DS which seems to have done a good job there. His most recent hemoglobin A1c was 9.8 that was on 04/14/2020. Subsequently I do feel like that the patient is making progress here. He does have a history of chronic venous insufficiency as well as hypertension. I do not see any need for antibiotics at this point. 5/25; follow-up of the wound on the left posterior calf. This is completely necrotic on the surface. It does not look infected but it is painful. He is a diabetic but I think there is some suggestion here of chronic venous disease as well. We used Iodoflex under compression last week 6/1; again a completely necrotic surface on this with the wound. We have been using Iodoflex under compression he complains of gnawing pain. He has had wounds previously a lot of this looks like chronic venous disease. 6/8; about two thirds of the surface of this wound is still covered in a black necrotic eschar. I changed him from Iodoflex to [place] last week because of complaints of pain. He actually was seen by her neurologist this weeko Peripheral neuropathy as a cause of the pain and they changed him from Neurontin to Lyrica but he still has not had any relief. He says that most of the pain however is in his heel not in his wound per se. He is a diabetic 6/15. This is a patient with what looks to be a venous wound on the left lateral lower leg. He is also a diabetic. Currently being worked up for diabetic neuropathy. He complains of pain out of proportion to the size of the wound although to be fair the entire area here was eschared. We have been working to get a viable surface. We are using Sorbact 6/22; fairly painful wound on the left posterior calf. I am assuming this is been venous he is also a diabetic. His ABI in our clinic was noncompressible however he has easily palpable pulses on his feet. He complains of a lot of  pain in the wound also of the left heel and the upper left calf. Some of this may be neuropathy. It is led me to discontinue Iodoflex reduce his compression but he still complains of pain in fact he says he could not take a debridement today. When I first saw this it was completely necrotic surface we have got it down to something that looks a reasonably healthy I have been wondering about biopsying this area however he is on Coumadin and  with the pain I have put this off. The major question would be an inflammatory ulcer such as pyoderma. The patient is not aware of how this started. He does however have a wound history on both legs. 6/29; patient presents for 1 week follow-up. He has been using Hydrofera Blue under Kerlix/Coban. He has tolerated this well. He currently denies signs of infection. He also declines debridement today. He he states it is painful and does not want to have this done. 7/6; Patient presents for 1 week follow-up. He has been using Hydrofera Blue under Kerlix/Coban. He denies signs of infection. He declines debridement today. 7/15; patient presents for 1 week follow-up. He has been using Hydrofera Blue under Kerlix/Coban. He denies signs of infection today. He is agreeable to having debridement done today. 7/20; patient presents for 1 week follow-up. He is in good spirits today. He has been using Hydrofera Blue under Kerlix/Coban. He denies signs of infection today. 7/27; patient presents for 1 week follow-up. He is in good spirits today. He has no issues or complaints today. He denies signs of infection. 08/12/2020 upon evaluation today patient appears to be doing better in regard to his wound. He has been tolerating the dressing changes without complication. With that being said he did have arterial studies and does show that he has a TBI on the right of 0.90 and a TBI on the left of 0.76 which is excellent. There is no evidence of arterial compromise at this point. 8/17;  patient presents for 1 week follow-up. He has no issues or complaints today. He denies signs of infection. 8/24; patient presents for 1 week follow-up. He has no issues or complaints today. He denies signs of infection. He continues to tolerate the compression wrap well. 8/31; patient presents for 1 week follow-up. He has no issues or complaints today. He denies signs of infection. He is in good spirits today. 9/7; patient presents for 1 week follow-up. He is tolerated the compression wrap well. He has no issues or complaints today. He denies signs of infection. 9/14; patient presents for 1 week follow-up. He has no issues or complaints today. He denies signs of infection. He is in excellent spirits today. 9/21; patient presents for 1 week follow-up. He has no issues or complaints today. Shane Bean, Shane Bean ([id number]) 9/28; patient presents for 1 week follow-up. He has tolerated the compression wrap well. He has no issues or complaints today. 10/5; patient presents for 1 week follow-up. He has no issues or complaints today. 10/12; patient presents for 1 week follow-up. He has no issues or complaints today. He denies signs of infection. Readmission: 04-08-2021 upon evaluation today patient appears to be doing poorly in regard to his right lower extremity. He is being seen for reevaluation here in the clinic he was last discharged healed in October 2022. Subsequently at that time it was noted in the chart that he was told to be wearing compression socks unfortunately the patient tells me he never remembers being told that. I did discuss this with him today as well as Hosie Poisson discussing it with him today again. Obviously he does know at this point once he is healed he should be wearing compression socks all the time from the time he gets up in the morning to when he goes to sleep at night day in and day out. I think this is the only way that you can to keep things under control. Otherwise his medical  history has not changed his arterial  study in the past was excellent and did not appear to show any signs of issue at this point which is great news as well. 04-15-2021 upon evaluation today patient's wound is actually looking much drier than what it was last week. Again I do believe the infection is showing signs of improvement although he is having pain for sore I think this has to do more with the dryness of the wound at this time as opposed to the infection that we were noted in the last week. Fortunately I do not see any evidence of active infection locally or systemically at this time which is great news. 04-22-2021 upon evaluation today patient unfortunately continues to have issues with his leg. This is getting significantly worse despite being on Augmentin. I am actually extremely concerned about worsening infection and especially considering the way the wound looks today and the odor present. 05-04-2021 upon evaluation today patient appears to be doing better with regard to the infection in his leg. He has been tolerating the dressing changes without complication and overall I am extremely pleased with where we stand. There does not appear to be any evidence of active infection locally or systemically which is great news. 05-13-2021 upon evaluation today patient appears to be doing better in regard to his leg ulcer. He has been tolerating the dressing changes without complication. He does have still significant pain he does have a lot of necrotic tissue which is working its way off and there is no signs of significant infection at this point which is great news. With that being said I do believe that the patient does need to likely continue with the wound care measures at this point with regard to the dressing that we are utilizing. 05-20-2021 upon evaluation today patient's wound is actually showing signs of fairly good granulation and epithelization in a lot of areas he still has some slough  noted at this point though a lot of the eschar is softening up I do believe the Xeroform has been helpful in this regard. Fortunately I do not see any evidence of active infection locally or systemically which is great news. No fevers, chills, nausea, vomiting, or diarrhea. I do believe the 3 layer compression wrap has been beneficial. 05-27-2021 upon evaluation today patient's wound is actually showing signs of significant improvement with a lot of new epithelial growth at this point. Fortunately I do not see any signs of active infection locally or systemically which is great news and overall I think we are headed in the right direction. 06-03-2021 upon evaluation today patient appears to be doing well with regard to his wounds although he still has a lot of slough and biofilm buildup noted. We are going to need to try to do some sharp debridement today to clear this away and he is in agreement with allowing me to try what we can. I am not certain how much working to be able to remove or not. 06-10-2021 upon evaluation today patient appears to be doing well with regard to his wound from the overall appearance and size standpoint this is good. From a drainage standpoint and the odor as well this seems to still be infected in my opinion. I do not think the Augmentin was sufficient. With that being said I discussed this with the patient I do believe that he may benefit from topical [facility] antibiotics in the meantime we will get a use gentamicin to bridge the gap until we get those. 06-17-2021 upon evaluation today  patient appears to be doing better in regard to his leg. I have not been able to get him the [facility] as it was too expensive for the topical antibiotics. For that reason would been using gentamicin which I do feel like is helping and I do think that is probably can to be our best bet at this point all things considered. He is in agreement with giving this a trial and seeing how things go.  Fortunately I see no evidence of active infection systemically locally some of the odor seems indicate there may be some bacteria present but I am not sure what is best to do past what we are already doing. 06-24-2021 upon evaluation today patient appears to be doing well with regard to his healing in fact he is got more epithelial growth than what we have seen previous. Fortunately I think that each week I see him this is looking better and better. Fortunately I see no signs of active infection at this time locally or systemically which is great news. 07-01-2021 upon evaluation today patient appears to be doing excellent in regard to his leg ulcers. He has been tolerating the dressing changes without complication. Fortunately I see no evidence of active infection locally or systemically at this time which is great news. Overall I am extremely happy with where we stand today. 7/5; patient presents for follow-up. We have been using gentamicin and Xeroform under compression therapy. He has no issues or complaints today. 07-15-2021 upon evaluation today patient appears to be doing well with regard to his leg is almost completely healed. Fortunately I do not see any evidence of active infection locally or systemically which is great news. No fevers, chills, nausea, vomiting, or diarrhea. 07-22-2021 upon evaluation patient appears to be almost completely healed. I thought we were going to be there today but we are not quite so. Fortunately I do not see any evidence of active infection locally or systemically which is great news and overall I am extremely pleased with where we stand currently. 7/27; the patient's wounds on the right lower leg which are secondary to chronic venous insufficiency are healed. His edema control is excellent. He has 1 pair of 20/30 below-knee stockings from elastic therapy. Unfortunately he did not bring those in today however he is going straight home Shane Bean, Shane V.  ([id number]) 08-05-2021 upon evaluation today patient appears to be doing well currently in regard to his wound. He has been tolerating the dressing changes without complication on the lateral aspect but unfortunately because he was not wearing his compression sock he actually has become swollen and has a blister that opened up on the medial aspect. We are going to need to train him a little bit better as far as how to use the compression stocking. I do believe this is the right strength in the right size I do not think it is too tight for him but it is can be hard for him to get if he keeps trying to do it the same way he was. 08-13-2021 upon evaluation today patient appears to be doing well currently in regard to his wound in fact he appears to be almost completely healed though he tells me is still a little bit sore. For that reason we will get a monitor this for 1 more week to make sure everything does well before we completely discharge him. He does have compression socks for when ready however. 08-19-2021 upon evaluation today patient appears to be doing well  currently in regard to his wound. He has been tolerating the dressing changes without complication. Fortunately I do not see any evidence of active infection in fact this appears to be completely healed. Objective Constitutional Well-nourished and well-hydrated in no acute distress. Vitals Time Taken: 11:18 AM, Height: 70 in, Weight: 277 lbs, BMI: 39.7, Temperature: 98.2 F, Pulse: 66 bpm, Respiratory Rate: 18 breaths/min, Blood Pressure: 130/69 mmHg. Respiratory normal breathing without difficulty. Psychiatric this patient is able to make decisions and demonstrates good insight into disease process. Alert and Oriented x 3. pleasant and cooperative. General Notes: Upon evaluation today patient's wound bed actually showed signs of good granulation and epithelization at this point. Fortunately there does not appear to be any signs of  infection which is great news and overall I am extremely pleased with where things stand currently. Integumentary (Hair, Skin) Wound #4 status is Healed - Epithelialized. Original cause of wound was Blister. The date acquired was: 07/30/2021. The wound has been in treatment 2 weeks. The wound is located on the Right,Medial Lower Leg. The wound measures 0cm length x 0cm width x 0cm depth; 0cm^2 area and 0cm^3 volume. There is a none present amount of drainage noted. Assessment Active Problems ICD-10 Type 2 diabetes mellitus with other skin ulcer Venous insufficiency (chronic) (peripheral) Non-pressure chronic ulcer of other part of left lower leg with fat layer exposed Non-pressure chronic ulcer of other part of right lower leg with fat layer exposed Essential (primary) hypertension Plan 1. I would recommend that we go ahead and continue with the wound care measures as before with regard to compression he does not need any actual wound treatment other than just some moisturizing lotion which I think is sufficient at this time. 2. I am also can recommend that we go ahead and have the patient continue to elevate his legs much as possible to help with edema control I think this is still can be a good way to go currently. We will see the patient back for follow-up visit as needed. Shane Bean, Shane Bean ([id number]) Electronic Signature(s) Signed: 08/19/2021 3:24:40 PM By: Worthy Keeler PA-C Entered By: Worthy Keeler on 08/19/2021 15:24:40 Shane Bean ([id number]) -------------------------------------------------------------------------------- SuperBill Details Patient Name: Shane Bean Date of Service: 08/19/2021 Medical Record Number: [id number] Patient Account Number: [id number] Date of Birth/Sex: 04/24/1955 (65 y.o. M) Treating RN: Cornell Barman Primary Care Provider: Reeves Dam Other Clinician: Referring Provider: Reeves Dam Treating Provider/Extender: Jeri Cos Weeks  in Treatment: 19 Diagnosis Coding ICD-10 Codes Code Description E11.622 Type 2 diabetes mellitus with other skin ulcer I87.2 Venous insufficiency (chronic) (peripheral) 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 layer exposed L97.812 Non-pressure chronic ulcer of other part of right lower leg with fat layer exposed [place] (primary) hypertension Physician Procedures CPT4 Code: [id number] Description: 99213 - WC PHYS LEVEL 3 - EST PT Modifier: Quantity: 1 CPT4 Code: Description: ICD-10 Diagnosis Description E11.622 Type 2 diabetes mellitus with other skin ulcer I87.2 Venous insufficiency (chronic) (peripheral) 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 lay L97.812 Non-pressure  chronic ulcer of other part of right lower leg with fat la Modifier: er exposed yer exposed Quantity: Electronic Signature(s) Signed: 08/19/2021 3:25:15 PM By: Worthy Keeler PA-C Entered By: Worthy Keeler on 08/19/2021 15:25:15

## 2021-08-21 NOTE — Progress Notes (Signed)
Shane Bean, Shane Bean ([id number]) Visit Report for 08/19/2021 Arrival Information Details Patient Name: Shane Bean, Shane Bean Date of Service: 08/19/2021 10:45 AM Medical Record Number: [id number] Patient Account Number: [id number] Date of Birth/Sex: October 25, 1955 (66 y.o. M) Treating RN: Alycia Rossetti Primary Care Thy Gullikson: Reeves Dam Other Clinician: Referring Jeidi Gilles: Reeves Dam Treating Robley Matassa/Extender: Skipper Cliche in Treatment: 19 Visit Information History Since Last Visit Added or deleted any medications: No Patient Arrived: Ambulatory Has Compression in Place as Prescribed: Yes Arrival Time: 11:18 Pain Present Now: No Accompanied By: self Transfer Assistance: None Patient Identification Verified: Yes Secondary Verification Process Completed: Yes Patient Requires Transmission-Based No Precautions: Patient Has Alerts: Yes Patient Alerts: Patient on Blood Thinner Electronic Signature(s) Signed: 08/19/2021 3:11:41 PM By: Massie Kluver Entered By: Massie Kluver on 08/19/2021 11:19:08 Shane Bean ([id number]) -------------------------------------------------------------------------------- Clinic Level of Care Assessment Details Patient Name: Shane Bean Date of Service: 08/19/2021 10:45 AM Medical Record Number: [id number] Patient Account Number: [id number] Date of Birth/Sex: 07/09/1955 (65 y.o. M) Treating RN: Cornell Barman Primary Care Jenavee Laguardia: Reeves Dam Other Clinician: Referring Tamarra Geiselman: Reeves Dam Treating Roderick Calo/Extender: Skipper Cliche in Treatment: 19 Clinic Level of Care Assessment Items TOOL 4 Quantity Score '[]'$  - Use when only an EandM is performed on FOLLOW-UP visit 0 ASSESSMENTS - Nursing Assessment / Reassessment X - Reassessment of Co-morbidities (includes updates in patient status) 1 10 X- 1 5 Reassessment of Adherence to Treatment Plan ASSESSMENTS - Wound and Skin Assessment / Reassessment '[]'$  - Simple Wound Assessment /  Reassessment - one wound 0 '[]'$  - 0 Complex Wound Assessment / Reassessment - multiple wounds '[]'$  - 0 Dermatologic / Skin Assessment (not related to wound area) ASSESSMENTS - Focused Assessment '[]'$  - Circumferential Edema Measurements - multi extremities 0 '[]'$  - 0 Nutritional Assessment / Counseling / Intervention '[]'$  - 0 Lower Extremity Assessment (monofilament, tuning fork, pulses) '[]'$  - 0 Peripheral Arterial Disease Assessment (using hand held doppler) ASSESSMENTS - Ostomy and/or Continence Assessment and Care '[]'$  - Incontinence Assessment and Management 0 '[]'$  - 0 Ostomy Care Assessment and Management (repouching, etc.) PROCESS - Coordination of Care X - Simple Patient / Family Education for ongoing care 1 15 '[]'$  - 0 Complex (extensive) Patient / Family Education for ongoing care X- 1 10 Staff obtains Programmer, systems, Records, Test Results / Process Orders '[]'$  - 0 Staff telephones HHA, Nursing Homes / Clarify orders / etc '[]'$  - 0 Routine Transfer to another Facility (non-emergent condition) '[]'$  - 0 Routine Hospital Admission (non-emergent condition) '[]'$  - 0 New Admissions / Biomedical engineer / Ordering NPWT, Apligraf, etc. '[]'$  - 0 Emergency Hospital Admission (emergent condition) X- 1 10 Simple Discharge Coordination '[]'$  - 0 Complex (extensive) Discharge Coordination PROCESS - Special Needs '[]'$  - Pediatric / Minor Patient Management 0 '[]'$  - 0 Isolation Patient Management '[]'$  - 0 Hearing / Language / Visual special needs '[]'$  - 0 Assessment of Community assistance (transportation, D/C planning, etc.) '[]'$  - 0 Additional assistance / Altered mentation '[]'$  - 0 Support Surface(s) Assessment (bed, cushion, seat, etc.) INTERVENTIONS - Wound Cleansing / Measurement Shane Bean, Shane V. ([id number]) '[]'$  - 0 Simple Wound Cleansing - one wound '[]'$  - 0 Complex Wound Cleansing - multiple wounds '[]'$  - 0 Wound Imaging (photographs - any number of wounds) '[]'$  - 0 Wound Tracing (instead of  photographs) '[]'$  - 0 Simple Wound Measurement - one wound '[]'$  - 0 Complex Wound Measurement - multiple wounds INTERVENTIONS - Wound Dressings '[]'$  - Small Wound Dressing one or multiple wounds 0 '[]'$  -  0 Medium Wound Dressing one or multiple wounds '[]'$  - 0 Large Wound Dressing one or multiple wounds '[]'$  - 0 Application of Medications - topical '[]'$  - 0 Application of Medications - injection INTERVENTIONS - Miscellaneous '[]'$  - External ear exam 0 '[]'$  - 0 Specimen Collection (cultures, biopsies, blood, body fluids, etc.) '[]'$  - 0 Specimen(s) / Culture(s) sent or taken to Lab for analysis '[]'$  - 0 Patient Transfer (multiple staff / Harrel Lemon Lift / Similar devices) '[]'$  - 0 Simple Staple / Suture removal (25 or less) '[]'$  - 0 Complex Staple / Suture removal (26 or more) '[]'$  - 0 Hypo / Hyperglycemic Management (close monitor of Blood Glucose) '[]'$  - 0 Ankle / Brachial Index (ABI) - do not check if billed separately X- 1 5 Vital Signs Has the patient been seen at the hospital within the last three years: Yes Total Score: 55 Level Of Care: New/Established - Level 2 Electronic Signature(s) Signed: 08/19/2021 5:20:01 PM By: Gretta Cool, BSN, RN, CWS, Kim RN, BSN Entered By: Gretta Cool, BSN, RN, CWS, Kim on 08/19/2021 17:15:56 Shane Bean ([id number]) -------------------------------------------------------------------------------- Encounter Discharge Information Details Patient Name: Shane Bean Date of Service: 08/19/2021 10:45 AM Medical Record Number: [id number] Patient Account Number: [id number] Date of Birth/Sex: 09/11/1955 (65 y.o. M) Treating RN: Cornell Barman Primary Care Zakhi Dupre: Reeves Dam Other Clinician: Referring Cadyn Fann: Reeves Dam Treating Kenlynn Houde/Extender: Skipper Cliche in Treatment: 19 Encounter Discharge Information Items Discharge Condition: Stable Ambulatory Status: Ambulatory Discharge Destination: Home Transportation: Private Auto Accompanied By: self Schedule  Follow-up Appointment: No Clinical Summary of Care: Electronic Signature(s) Signed: 08/19/2021 5:16:51 PM By: Gretta Cool, BSN, RN, CWS, Kim RN, BSN Entered By: Gretta Cool, BSN, RN, CWS, Kim on 08/19/2021 Shane Bean, Shane Bean. ([id number]) -------------------------------------------------------------------------------- Lower Extremity Assessment Details Patient Name: Shane Bean Date of Service: 08/19/2021 10:45 AM Medical Record Number: [id number] Patient Account Number: [id number] Date of Birth/Sex: April 19, 1955 (65 y.o. M) Treating RN: Cornell Barman Primary Care Layton Naves: Reeves Dam Other Clinician: Referring Lenita Peregrina: Reeves Dam Treating Myrikal Messmer/Extender: Jeri Cos Weeks in Treatment: 19 Edema Assessment Assessed: [Left: No] [Right: No] [Left: Edema] [Right: :] Calf Left: Right: Point of Measurement: 34 cm From Medial Instep 44 cm Ankle Left: Right: Point of Measurement: 11 cm From Medial Instep 25 cm Vascular Assessment Pulses: Dorsalis Pedis Palpable: [Right:Yes] Electronic Signature(s) Signed: 08/19/2021 3:11:41 PM By: Massie Kluver Signed: 08/19/2021 5:20:01 PM By: Gretta Cool, BSN, RN, CWS, Kim RN, BSN Entered By: Massie Kluver on 08/19/2021 11:25:16 Shane Bean ([id number]) -------------------------------------------------------------------------------- Multi Wound Chart Details Patient Name: Shane Bean Date of Service: 08/19/2021 10:45 AM Medical Record Number: [id number] Patient Account Number: [id number] Date of Birth/Sex: 03-15-1955 (65 y.o. M) Treating RN: Cornell Barman Primary Care Keneshia Tena: Reeves Dam Other Clinician: Referring Sunny Aguon: Reeves Dam Treating Adalynne Steffensmeier/Extender: Skipper Cliche in Treatment: 19 Vital Signs Height(in): 70 Pulse(bpm): 66 Weight(lbs): 277 Blood Pressure(mmHg): 130/69 Body Mass Index(BMI): 39.7 Temperature(F): 98.2 Respiratory Rate(breaths/min): 18 Photos: [N/A:N/A] Wound Location: Right, Medial Lower  Leg N/A N/A Wounding Event: Blister N/A N/A Primary Etiology: Diabetic Wound/Ulcer of the Lower N/A N/A Extremity Date Acquired: 07/30/2021 N/A N/A Weeks of Treatment: 2 N/A N/A Wound Status: Healed - Epithelialized N/A N/A Wound Recurrence: No N/A N/A Measurements L x W x D (cm) 0x0x0 N/A N/A Area (cm) : 0 N/A N/A Volume (cm) : 0 N/A N/A % Reduction in Area: 100.00% N/A N/A % Reduction in Volume: 100.00% N/A N/A Classification: Grade 1 N/A N/A Exudate Amount: None Present N/A N/A Treatment Notes Electronic Signature(s)  Signed: 08/19/2021 5:14:59 PM By: Gretta Cool, BSN, RN, CWS, Kim RN, BSN Entered By: Gretta Cool, BSN, RN, CWS, Kim on 08/19/2021 17:14:59 Shane Bean ([id number]) -------------------------------------------------------------------------------- Multi-Disciplinary Care Plan Details Patient Name: Shane Bean Date of Service: 08/19/2021 10:45 AM Medical Record Number: [id number] Patient Account Number: [id number] Date of Birth/Sex: April 08, 1955 (65 y.o. M) Treating RN: Cornell Barman Primary Care Melo Stauber: Reeves Dam Other Clinician: Referring Lailee Hoelzel: Reeves Dam Treating Terryn Rosenkranz/Extender: Skipper Cliche in Treatment: 19 Active Inactive Electronic Signature(s) Signed: 08/19/2021 3:11:41 PM By: Massie Kluver Signed: 08/19/2021 5:20:01 PM By: Gretta Cool BSN, RN, CWS, Kim RN, BSN Entered By: Massie Kluver on 08/19/2021 11:25:22 Shane Bean, Shane Bean ([id number]) -------------------------------------------------------------------------------- Pain Assessment Details Patient Name: Shane Bean Date of Service: 08/19/2021 10:45 AM Medical Record Number: [id number] Patient Account Number: [id number] Date of Birth/Sex: [birth year] (65 y.o. M) Treating RN: Alycia Rossetti Primary Care Rembert Browe: Reeves Dam Other Clinician: Referring Sumi Lye: Reeves Dam Treating Alastor Kneale/Extender: Skipper Cliche in Treatment: 19 Active Problems Location of Pain Severity and  Description of Pain Patient Has Paino No Site Locations Pain Management and Medication Current Pain Management: Electronic Signature(s) Signed: 08/19/2021 2:37:10 PM By: Alycia Rossetti Signed: 08/19/2021 3:11:41 PM By: Massie Kluver Entered By: Massie Kluver on 08/19/2021 11:19:36 Shane Bean ([id number]) -------------------------------------------------------------------------------- Patient/Caregiver Education Details Patient Name: Shane Bean Date of Service: 08/19/2021 10:45 AM Medical Record Number: [id number] Patient Account Number: [id number] Date of Birth/Gender: 16-Sep-1955 (65 y.o. M) Treating RN: Cornell Barman Primary Care Physician: Reeves Dam Other Clinician: Referring Physician: Reeves Dam Treating Physician/Extender: Skipper Cliche in Treatment: 19 Education Assessment Education Provided To: Patient Education Topics Provided Venous: Handouts: Controlling Swelling with Compression Stockings Methods: Demonstration, Explain/Verbal Responses: State content correctly Electronic Signature(s) Signed: 08/19/2021 5:20:01 PM By: Gretta Cool, BSN, RN, CWS, Kim RN, BSN Entered By: Gretta Cool, BSN, RN, CWS, Kim on 08/19/2021 Shane Bean, Admire. ([id number]) -------------------------------------------------------------------------------- Wound Assessment Details Patient Name: Shane Bean Date of Service: 08/19/2021 10:45 AM Medical Record Number: [id number] Patient Account Number: [id number] Date of Birth/Sex: 1955/09/07 (65 y.o. M) Treating RN: Alycia Rossetti Primary Care Antwann Preziosi: Reeves Dam Other Clinician: Referring Aum Caggiano: Reeves Dam Treating Sally-Anne Wamble/Extender: Jeri Cos Weeks in Treatment: 19 Wound Status Wound Number: 4 Primary Etiology: Diabetic Wound/Ulcer of the Lower Extremity Wound Location: Right, Medial Lower Leg Wound Status: Healed - Epithelialized Wounding Event: Blister Date Acquired: 07/30/2021 Weeks Of Treatment:  2 Clustered Wound: No Photos Photo Uploaded By: Massie Kluver on 08/19/2021 11:26:41 Wound Measurements Length: (cm) 0 Width: (cm) 0 Depth: (cm) 0 Area: (cm) 0 Volume: (cm) 0 % Reduction in Area: 100% % Reduction in Volume: 100% Wound Description Classification: Grade 1 Exudate Amount: None Present Treatment Notes Wound #4 (Lower Leg) Wound Laterality: Right, Medial Cleanser Peri-Wound Care Topical Primary Dressing Secondary Dressing Secured With Compression Wrap Compression Stockings Add-Ons Electronic Signature(s) Shane Bean, Shane Bean ([id number]) Signed: 08/19/2021 2:37:10 PM By: Alycia Rossetti Signed: 08/19/2021 3:11:41 PM By: Massie Kluver Entered By: Massie Kluver on 08/19/2021 11:23:57 Shane Bean ([id number]) -------------------------------------------------------------------------------- [place]ilia Details Patient Name: Shane Bean Date of Service: 08/19/2021 10:45 AM Medical Record Number: [id number] Patient Account Number: [id number] Date of Birth/Sex: [birth year] (65 y.o. M) Treating RN: Alycia Rossetti Primary Care Camaya Gannett: Reeves Dam Other Clinician: Referring Heavyn Yearsley: Reeves Dam Treating Radonna Bracher/Extender: Skipper Cliche in Treatment: 19 Vital Signs Time Taken: 11:18 Temperature (F): 98.2 Height (in): 70 Pulse (bpm): 66 Weight (lbs): 277 Respiratory Rate (breaths/min): 18 Body Mass Index (BMI): 39.7 Blood Pressure (mmHg): 130/69 Reference Range: 80 -  120 mg / dl Electronic Signature(s) Signed: 08/19/2021 3:11:41 PM By: Massie Kluver Entered By: Massie Kluver on 08/19/2021 11:19:31

## 2021-08-23 ENCOUNTER — Ambulatory Visit: Payer: Medicare (Managed Care) | Admitting: *Deleted

## 2021-09-02 ENCOUNTER — Encounter: Payer: Self-pay | Admitting: *Deleted

## 2021-10-18 ENCOUNTER — Telehealth: Payer: Self-pay | Admitting: Gastroenterology

## 2021-10-18 ENCOUNTER — Other Ambulatory Visit: Payer: Self-pay | Admitting: *Deleted

## 2021-10-18 ENCOUNTER — Telehealth: Payer: Self-pay | Admitting: *Deleted

## 2021-10-18 DIAGNOSIS — Z86010 Personal history of colonic polyps: Secondary | ICD-10-CM

## 2021-10-18 MED ORDER — NA SULFATE-K SULFATE-MG SULF 17.5-3.13-1.6 GM/177ML PO SOLN: ORAL | 0 refills | Status: AC

## 2021-10-18 NOTE — Telephone Encounter (Signed)
Per pt phone  call would like to know if he can schedule another colonoscopy with Dr. Allen Norris

## 2021-10-18 NOTE — Telephone Encounter (Signed)
Gastroenterology Pre-Procedure Review  Request Date: 11/18/2021 Requesting Physician: Dr. Allen Norris  PATIENT REVIEW QUESTIONS: The patient responded to the following health history questions as indicated:    1. Are you having any GI issues? no 2. Do you have a personal history of Polyps? Yes(last colonoscopy was 05/21/2019) 3. Do you have a family history of Colon Cancer or Polyps?no 4. Diabetes Mellitus? yes (Type 2 DM , taking glipizide 5 mg) 5. Joint replacements in the past 12 months?no 6. Major health problems in the past 3 months?no 7. Any artificial heart valves, MVP, or defibrillator?no    MEDICATIONS & ALLERGIES:    Patient reports the following regarding taking any anticoagulation/antiplatelet therapy:   Plavix, Coumadin, Eliquis, Xarelto, Lovenox, Pradaxa, Brilinta, or Effient? yes (coumadin) Aspirin? no  Patient confirms/reports the following medications:  Current Outpatient Medications  Medication Sig Dispense Refill   Na Sulfate-K Sulfate-Mg Sulf 17.5-3.13-1.6 GM/177ML SOLN At 5:00 pm the evening before the procedure: Pour the contents of one bottle of Suprep into the mixing container provided.  Fill the container with cold water to the fill line and mix. DRINK all the liquid in the container. You must drink two (2) more 16 ounces containers of water over the next 1 hour.On the day of the procedure, 5 hours before procedure: Pour the contents of the second bottle of Suprep into the mixing container provided and follow the same instructions. 354 mL 0   allopurinol (ZYLOPRIM) 100 MG tablet Take 100 mg by mouth daily at 3 pm.     amLODipine (NORVASC) 10 MG tablet Take 10 mg by mouth at bedtime.     aspirin EC 81 MG tablet Take 1 tablet (81 mg total) by mouth daily. 30 tablet 0   doxazosin (CARDURA) 8 MG tablet Take 8 mg by mouth at bedtime.      glipiZIDE (GLUCOTROL) 5 MG tablet Take 2 tablets by mouth 2 (two) times daily.     levocetirizine (XYZAL) 5 MG tablet Take 1 tablet by  mouth daily.     metoprolol tartrate (LOPRESSOR) 100 MG tablet Take 200 mg by mouth daily.     omeprazole (PRILOSEC) 40 MG capsule Take 1 capsule by mouth daily.     pregabalin (LYRICA) 75 MG capsule Take 75 mg by mouth in the morning, at noon, and at bedtime.     simvastatin (ZOCOR) 10 MG tablet Take 10 mg by mouth every evening.     sodium hypochlorite (DAKIN'S 1/4 STRENGTH) 0.125 % SOLN Apply Dakin's moistened gauze to right leg wounds Q day, then cover with ABD pads.  Wrap with kerlex in a spiral fashion, beginning just behind toes, to below knee, then in the same manner with ace wrap 473 mL 0   spironolactone (ALDACTONE) 25 MG tablet Take 25 mg by mouth at bedtime.     warfarin (COUMADIN) 6 MG tablet Take 6 mg by mouth every evening.     No current facility-administered medications for this visit.    Patient confirms/reports the following allergies:  Allergies  Allergen Reactions   Chlorthalidone Swelling    Testicular/Scrotum edema   Hydralazine Other (See Comments)    GI Upset (intolerance)   Lisinopril Swelling    Facial Edema (intolerance)   Metformin And Related Other (See Comments)    "Jittery"    No orders of the defined types were placed in this encounter.   AUTHORIZATION INFORMATION Primary Insurance: 1D#: Group #:  Secondary Insurance: 1D#: Group #:  SCHEDULE INFORMATION: Date: 11/18/2021 Time:  [place]

## 2021-10-18 NOTE — Telephone Encounter (Signed)
Spoken to patient and colonoscopy have been scheduled 11/18/2021

## 2021-10-18 NOTE — Telephone Encounter (Signed)
Inform by Shane Bean. that patient's insurance is out of network. I have called patient and cancel the colonoscopy. Patient verbalized understanding.

## 2021-10-18 NOTE — Telephone Encounter (Signed)
Voicemail message left for patient to return my call.  

## 2021-11-18 ENCOUNTER — Ambulatory Visit: Admit: 2021-11-18 | Payer: Medicare (Managed Care) | Admitting: Gastroenterology

## 2021-11-18 SURGERY — COLONOSCOPY WITH PROPOFOL
Anesthesia: General

## 2022-02-02 ENCOUNTER — Telehealth: Payer: Self-pay

## 2022-02-02 NOTE — Telephone Encounter (Signed)
Call pt to make him aware we can not r/s colonoscopy until after March 11 ,2024 after he have the cardiac ultrasound.

## 2022-02-02 NOTE — Telephone Encounter (Signed)
Blood Thinner Advice and Cardiac Clearance has been sent.  Thanks, [place], [place]

## 2022-02-02 NOTE — Telephone Encounter (Signed)
Patient contacted Lattie Haw at our [facility] to get rescheduled for his colonoscopy.  I informed Lattie Haw that pt will need cardiac clearance as he is scheduled for Cardiac Ultrasound in March.  His colonoscopy will be rescheduled after he has had his cardiac work up with his cardiologist.  Blood Thinner Advice will need to be sent to   Cardiologist Information:   [place], [place] [address]  [phone]  Kizzie Furnish, [facility], [place] [address]  [phone] (Work)  [phone] (Fax)   Coumadin Prescriber per patient is Reeves Dam:  Janyth Pupa, ANP  [place]  [place], [place] [address]  [phone] (Work)  [phone] (Fax)

## 2022-02-03 NOTE — Telephone Encounter (Signed)
Received phone call back from [place] in regards to cardiac clearance request.  I was informed that patient does not have a cardiologist, however his Coumadin is managed by his PCP Reeves Dam.  Based on patient not having a cardiologist I will route a new clearance letter to his PCP to address clearance and coumadin advice.  Thanks,  [place], [place]

## 2022-04-21 ENCOUNTER — Other Ambulatory Visit: Payer: Self-pay

## 2022-04-21 ENCOUNTER — Telehealth: Payer: Self-pay

## 2022-04-21 DIAGNOSIS — Z86010 Personal history of colonic polyps: Secondary | ICD-10-CM

## 2022-04-21 NOTE — Telephone Encounter (Signed)
Blood Thinner Advice and Medical Clearance was received from patients PCP Robin Gibson-Np on 02/21/22.    Per completed request "Patient is cleared to have procedure".  "Patient may stop Coumadin 5 days prior to procedure. Patient is to restart blood thinner 1-2 days after procedure".  Patient was contacted to schedule his colonoscopy with Dr. Servando Snare.  He has been scheduled at [facility] on 05/24/22.  Advised to stop Coumadin on 05/19/22. Resume on 05/20/22.  Stop Glipizide on 05/23/22.   Take only bp medications the morning of procedure.  Colonoscopy instructions reviewed and sent to patient.  His rx was sent previously to his pharmacy in [facility].  I've contacted them to ask them to fill his prescription for Suprep.  They said they will get it prepared.  Thanks,  [place], [place]

## 2022-05-23 ENCOUNTER — Telehealth: Payer: Self-pay

## 2022-05-23 NOTE — Telephone Encounter (Signed)
Received message from Rosann Auerbach stating patient has been feeling sick on the stomach.  Contacted patient to discuss.  He said he has been eating light for the past 2 days preparing for his colonoscopy.  I informed him that yesterday was a low fiber diet, and asked what he ate.  He said he ate very little yesterday-applesauce and water all day.  I advised him to continue today with his clear liquid diet, and get more liquids in.  Recommended he go to the store to purchase lemon or lime jello, some apple juices, and chicken broth.  He plans on mixing  his prep with lemon or orange gatorade to help.  He appreciated the phone call. He did not want to reschedule his procedure.  I asked if he had any other concerns he said no.  Thanks, [place], [place]

## 2022-05-24 ENCOUNTER — Ambulatory Visit
Admission: RE | Admit: 2022-05-24 | Discharge: 2022-05-24 | Disposition: A | Discharge status: Home / Self Care | Payer: Medicare HMO | Attending: Gastroenterology | Admitting: Gastroenterology

## 2022-05-24 ENCOUNTER — Encounter
Admission: RE | Disposition: A | Discharge status: Home / Self Care | Payer: Self-pay | Source: Home / Self Care | Attending: Gastroenterology

## 2022-05-24 ENCOUNTER — Ambulatory Visit: Payer: Medicare HMO | Admitting: Anesthesiology

## 2022-05-24 ENCOUNTER — Other Ambulatory Visit: Payer: Self-pay

## 2022-05-24 ENCOUNTER — Encounter: Payer: Self-pay | Admitting: Gastroenterology

## 2022-05-24 DIAGNOSIS — Z7984 Long term (current) use of oral hypoglycemic drugs: Secondary | ICD-10-CM | POA: Diagnosis not present

## 2022-05-24 DIAGNOSIS — E119 Type 2 diabetes mellitus without complications: Secondary | ICD-10-CM | POA: Diagnosis not present

## 2022-05-24 DIAGNOSIS — Z860100 Personal history of colon polyps, unspecified: Secondary | ICD-10-CM

## 2022-05-24 DIAGNOSIS — D123 Benign neoplasm of transverse colon: Secondary | ICD-10-CM | POA: Diagnosis not present

## 2022-05-24 DIAGNOSIS — Z86718 Personal history of other venous thrombosis and embolism: Secondary | ICD-10-CM | POA: Diagnosis not present

## 2022-05-24 DIAGNOSIS — Z1211 Encounter for screening for malignant neoplasm of colon: Secondary | ICD-10-CM | POA: Diagnosis present

## 2022-05-24 DIAGNOSIS — I10 Essential (primary) hypertension: Secondary | ICD-10-CM | POA: Diagnosis not present

## 2022-05-24 DIAGNOSIS — K219 Gastro-esophageal reflux disease without esophagitis: Secondary | ICD-10-CM | POA: Insufficient documentation

## 2022-05-24 DIAGNOSIS — K641 Second degree hemorrhoids: Secondary | ICD-10-CM | POA: Insufficient documentation

## 2022-05-24 DIAGNOSIS — K5730 Diverticulosis of large intestine without perforation or abscess without bleeding: Secondary | ICD-10-CM | POA: Diagnosis not present

## 2022-05-24 DIAGNOSIS — Z09 Encounter for follow-up examination after completed treatment for conditions other than malignant neoplasm: Secondary | ICD-10-CM | POA: Insufficient documentation

## 2022-05-24 HISTORY — PX: COLONOSCOPY WITH PROPOFOL: SHX5780

## 2022-05-24 LAB — GLUCOSE, CAPILLARY: Glucose-Capillary: 249 mg/dL — ABNORMAL HIGH (ref 70–99)

## 2022-05-24 SURGERY — COLONOSCOPY WITH PROPOFOL
Anesthesia: General

## 2022-05-24 MED ORDER — PROPOFOL 10 MG/ML IV BOLUS
INTRAVENOUS | Status: DC | PRN
  Administered 2022-05-24: 140 mg via INTRAVENOUS

## 2022-05-24 MED ORDER — SODIUM CHLORIDE 0.9 % IV SOLN: INTRAVENOUS | Status: DC

## 2022-05-24 MED ORDER — PROPOFOL 500 MG/50ML IV EMUL
INTRAVENOUS | Status: DC | PRN
  Administered 2022-05-24: 210.139 ug/kg/min via INTRAVENOUS

## 2022-05-24 MED ORDER — DEXMEDETOMIDINE HCL 200 MCG/2ML IV SOLN
INTRAVENOUS | Status: DC | PRN
  Administered 2022-05-24: 10 ug via INTRAVENOUS

## 2022-05-24 MED ORDER — LIDOCAINE HCL (CARDIAC) PF 100 MG/5ML IV SOSY
PREFILLED_SYRINGE | INTRAVENOUS | Status: DC | PRN
  Administered 2022-05-24: 100 mg via INTRAVENOUS

## 2022-05-24 MED ORDER — GLYCOPYRROLATE 0.2 MG/ML IJ SOLN
INTRAMUSCULAR | Status: DC | PRN
  Administered 2022-05-24: .2 mg via INTRAVENOUS

## 2022-05-24 NOTE — Op Note (Signed)
[facility] Patient Name: Shane Bean Procedure Date: 05/24/2022 10:33 AM MRN: [id number] Account #: [id number] Date of Birth: 09-05-1955 Admit Type: Outpatient Age: 67 Room: [facility] ENDO ROOM 4 Gender: Male Note Status: Finalized Instrument Name: Prentice Docker [id number] Procedure:             Colonoscopy Indications:           High risk colon cancer surveillance: Personal history                         of colonic polyps Providers:             Midge Minium MD, MD Medicines:             Propofol per Anesthesia Complications:         No immediate complications. Procedure:             Pre-Anesthesia Assessment:                        - Prior to the procedure, a History and Physical was                         performed, and patient medications and allergies were                         reviewed. The patient's tolerance of previous                         anesthesia was also reviewed. The risks and benefits                         of the procedure and the sedation options and risks                         were discussed with the patient. All questions were                         answered, and informed consent was obtained. Prior                         Anticoagulants: The patient has taken no anticoagulant                         or antiplatelet agents. ASA Grade Assessment: II - A                         patient with mild systemic disease. After reviewing                         the risks and benefits, the patient was deemed in                         satisfactory condition to undergo the procedure.                        After obtaining informed consent, the colonoscope was                         passed under direct vision. Throughout  the procedure,                         the patient's blood pressure, pulse, and oxygen                         saturations were monitored continuously. The                         Colonoscope was introduced through the  anus and                         advanced to the the ileocolonic anastomosis. The                         colonoscopy was performed without difficulty. The                         patient tolerated the procedure well. The quality of                         the bowel preparation was good. Findings:      The perianal and digital rectal examinations were normal.      A 4 mm polyp was found in the transverse colon. The polyp was sessile.       The polyp was removed with a cold snare. Resection and retrieval were       complete.      A 3 mm polyp was found in the descending colon. The polyp was sessile.       The polyp was removed with a cold snare. Resection and retrieval were       complete.      Multiple small-mouthed diverticula were found in the entire colon.      Non-bleeding internal hemorrhoids were found during retroflexion. The       hemorrhoids were Grade II (internal hemorrhoids that prolapse but reduce       spontaneously). Impression:            - One 4 mm polyp in the transverse colon, removed with                         a cold snare. Resected and retrieved.                        - One 3 mm polyp in the descending colon, removed with                         a cold snare. Resected and retrieved.                        - Diverticulosis in the entire examined colon.                        - Non-bleeding internal hemorrhoids. Recommendation:        - Discharge patient to home.                        - Resume previous diet.                        -  Continue present medications.                        - Await pathology results.                        - Repeat colonoscopy in 5 years for surveillance. Procedure Code(s):     --- Professional ---                        [phone], Colonoscopy, flexible; with removal of                         tumor(s), polyp(s), or other lesion(s) by snare                         technique Diagnosis Code(s):     --- Professional ---                         Z86.010, Personal history of colonic polyps                        D12.3, Benign neoplasm of transverse colon (hepatic                         flexure or splenic flexure) CPT copyright 2022 American [employer]. All rights reserved. The codes documented in this report are preliminary and upon coder review may  be revised to meet current compliance requirements. Midge Minium MD, MD 05/24/2022 11:09:04 AM This report has been signed electronically. Number of Addenda: 0 Note Initiated On: 05/24/2022 10:33 AM Scope Withdrawal Time: 0 hours 6 minutes 2 seconds  Total Procedure Duration: 0 hours 11 minutes 25 seconds  Estimated Blood Loss:  Estimated blood loss: none.      [facility]

## 2022-05-24 NOTE — Brief Op Note (Signed)
Pt had previous right colectomy. No cecum

## 2022-05-24 NOTE — Anesthesia Preprocedure Evaluation (Signed)
Anesthesia Evaluation  Patient identified by MRN, date of birth, ID band Patient awake    Reviewed: Allergy & Precautions, NPO status , Patient's Chart, lab work & pertinent test results  History of Anesthesia Complications Negative for: history of anesthetic complications  Airway Mallampati: III  TM Distance: <3 FB Neck ROM: full    Dental  (+) Chipped, Poor Dentition, Missing, Partial Upper   Pulmonary neg pulmonary ROS, neg shortness of breath   Pulmonary exam normal        Cardiovascular Exercise Tolerance: Good hypertension, (-) angina Normal cardiovascular exam     Neuro/Psych negative neurological ROS  negative psych ROS   GI/Hepatic Neg liver ROS,GERD  Controlled,,  Endo/Other  diabetes, Type 2    Renal/GU Renal disease  negative genitourinary   Musculoskeletal   Abdominal   Peds  Hematology negative hematology ROS (+)   Anesthesia Other Findings Past Medical History: No date: Arthritis No date: Ascending aortic dissection (HCC) No date: Diabetes mellitus without complication (HCC) No date: DVT (deep venous thrombosis) (HCC)     Comment:  legs and lungs  No date: Hyperlipemia No date: Hypertension No date: Kidney stones  Past Surgical History: No date: CARDIAC SURGERY 04/04/2017: COLONOSCOPY WITH PROPOFOL; N/A     Comment:  Procedure: COLONOSCOPY WITH PROPOFOL;  Surgeon: Midge Minium, MD;  Location: [facility] ENDOSCOPY;  Service:               Endoscopy;  Laterality: N/A; 05/21/2019: COLONOSCOPY WITH PROPOFOL; N/A     Comment:  Procedure: COLONOSCOPY WITH PROPOFOL;  Surgeon: Midge Minium, MD;  Location: [facility] ENDOSCOPY;  Service:               Endoscopy;  Laterality: N/A; No date: DENTAL SURGERY 04/04/2017: ESOPHAGOGASTRODUODENOSCOPY (EGD) WITH PROPOFOL; N/A     Comment:  Procedure: ESOPHAGOGASTRODUODENOSCOPY (EGD) WITH               PROPOFOL;  Surgeon: Midge Minium, MD;   Location: [facility]               ENDOSCOPY;  Service: Endoscopy;  Laterality: N/A; 08/17/2017: LAPAROSCOPIC RIGHT HEMI COLECTOMY; Right     Comment:  Procedure: LAPAROSCOPIC  RIGHT HEMI COLECTOMY,               COLONOSCOPY, UMBILICAL HERNIA REPAIR;  Surgeon: Andria Meuse, MD;  Location: [facility] ORS;  Service: General;               Laterality: Right; No date: REPAIR THORACIC AORTA  BMI    Body Mass Index: 40.15 kg/m      Reproductive/Obstetrics negative OB ROS                             Anesthesia Physical Anesthesia Plan  ASA: 3  Anesthesia Plan: General   Post-op Pain Management:    Induction: Intravenous  PONV Risk Score and Plan: Propofol infusion and TIVA  Airway Management Planned: Natural Airway and Nasal Cannula  Additional Equipment:   Intra-op Plan:   Post-operative Plan:   Informed Consent: I have reviewed the patients History and Physical, chart, labs and discussed the procedure including the risks, benefits and alternatives for the proposed anesthesia with  the patient or authorized representative who has indicated his/her understanding and acceptance.     Dental Advisory Given  Plan Discussed with: Anesthesiologist, CRNA and Surgeon  Anesthesia Plan Comments: (Patient consented for risks of anesthesia including but not limited to:  - adverse reactions to medications - risk of airway placement if required - damage to eyes, teeth, lips or other oral mucosa - nerve damage due to positioning  - sore throat or hoarseness - Damage to heart, brain, nerves, lungs, other parts of body or loss of life  Patient voiced understanding.)       Anesthesia Quick Evaluation

## 2022-05-24 NOTE — Anesthesia Postprocedure Evaluation (Signed)
Anesthesia Post Note  Patient: Shane Bean  Procedure(s) Performed: COLONOSCOPY WITH PROPOFOL  Patient location during evaluation: Endoscopy Anesthesia Type: General Level of consciousness: awake and alert Pain management: pain level controlled Vital Signs Assessment: post-procedure vital signs reviewed and stable Respiratory status: spontaneous breathing, nonlabored ventilation, respiratory function stable and patient connected to nasal cannula oxygen Cardiovascular status: blood pressure returned to baseline and stable Postop Assessment: no apparent nausea or vomiting Anesthetic complications: no   No notable events documented.   Last Vitals:  Vitals:   05/24/22 1114 05/24/22 1134  BP: 113/81 124/83  Pulse:    Resp:    Temp:    SpO2:      Last Pain:  Vitals:   05/24/22 1134  TempSrc:   PainSc: 0-No pain                 Cleda Mccreedy Kimetha Trulson

## 2022-05-24 NOTE — Transfer of Care (Signed)
Immediate Anesthesia Transfer of Care Note  Patient: Shane Bean  Procedure(s) Performed: COLONOSCOPY WITH PROPOFOL  Patient Location: Endoscopy Unit  Anesthesia Type:General  Level of Consciousness: drowsy  Airway & Oxygen Therapy: Patient Spontanous Breathing  Post-op Assessment: Report given to RN and Post -op Vital signs reviewed and stable  Post vital signs: Reviewed and stable  Last Vitals:  Vitals Value Taken Time  BP 113/81 05/24/22 1114  Temp    Pulse 65 05/24/22 1115  Resp    SpO2 99 % 05/24/22 1115  Vitals shown include unvalidated device data.  Last Pain:  Vitals:   05/24/22 1018  TempSrc: Temporal  PainSc: 0-No pain         Complications: No notable events documented.

## 2022-05-24 NOTE — H&P (Signed)
Midge Minium, MD [facility] [address], [address] [place], [place] [address] Phone:[phone] Fax : [phone]  Primary Care Physician:  Nelma Rothman, NP Primary Gastroenterologist:  Dr. Servando Snare  Pre-Procedure History & Physical: HPI:  Shane Bean is a 67 y.o. male is here for an colonoscopy.   Past Medical History:  Diagnosis Date   Arthritis    Ascending aortic dissection (HCC)    Diabetes mellitus without complication (HCC)    DVT (deep venous thrombosis) (HCC)    legs and lungs    Hyperlipemia    Hypertension    Kidney stones     Past Surgical History:  Procedure Laterality Date   CARDIAC SURGERY     COLONOSCOPY WITH PROPOFOL N/A 04/04/2017   Procedure: COLONOSCOPY WITH PROPOFOL;  Surgeon: Midge Minium, MD;  Location: [facility] ENDOSCOPY;  Service: Endoscopy;  Laterality: N/A;   COLONOSCOPY WITH PROPOFOL N/A 05/21/2019   Procedure: COLONOSCOPY WITH PROPOFOL;  Surgeon: Midge Minium, MD;  Location: [facility] ENDOSCOPY;  Service: Endoscopy;  Laterality: N/A;   DENTAL SURGERY     ESOPHAGOGASTRODUODENOSCOPY (EGD) WITH PROPOFOL N/A 04/04/2017   Procedure: ESOPHAGOGASTRODUODENOSCOPY (EGD) WITH PROPOFOL;  Surgeon: Midge Minium, MD;  Location: [facility] ENDOSCOPY;  Service: Endoscopy;  Laterality: N/A;   LAPAROSCOPIC RIGHT HEMI COLECTOMY Right 08/17/2017   Procedure: LAPAROSCOPIC  RIGHT HEMI COLECTOMY, COLONOSCOPY, UMBILICAL HERNIA REPAIR;  Surgeon: Andria Meuse, MD;  Location: [facility] ORS;  Service: General;  Laterality: Right;   REPAIR THORACIC AORTA      Prior to Admission medications   Medication Sig Start Date End Date Taking? Authorizing Provider  amLODipine (NORVASC) 10 MG tablet Take 10 mg by mouth at bedtime.   Yes [provider]  aspirin EC 81 MG tablet Take 1 tablet (81 mg total) by mouth daily. 04/29/21  Yes Sunnie Nielsen, DO  glipiZIDE (GLUCOTROL) 5 MG tablet Take 2 tablets by mouth 2 (two) times daily. 06/24/21 06/24/22 Yes [provider]   metoprolol tartrate (LOPRESSOR) 100 MG tablet Take 200 mg by mouth daily.   Yes [provider]  omeprazole (PRILOSEC) 40 MG capsule Take 1 capsule by mouth daily. 05/27/21  Yes [provider]  pregabalin (LYRICA) 75 MG capsule Take 75 mg by mouth in the morning, at noon, and at bedtime. 05/05/21  Yes [provider]  simvastatin (ZOCOR) 10 MG tablet Take 10 mg by mouth every evening.   Yes [provider]  spironolactone (ALDACTONE) 25 MG tablet Take 25 mg by mouth at bedtime.   Yes [provider]  allopurinol (ZYLOPRIM) 100 MG tablet Take 100 mg by mouth daily at 3 pm.    [provider]  doxazosin (CARDURA) 8 MG tablet Take 8 mg by mouth at bedtime.     [provider]  levocetirizine (XYZAL) 5 MG tablet Take 1 tablet by mouth daily. 06/25/21   [provider]  Na Sulfate-K Sulfate-Mg Sulf 17.5-3.13-1.6 GM/177ML SOLN At 5:00 pm the evening before the procedure: Pour the contents of one bottle of Suprep into the mixing container provided.  Fill the container with cold water to the fill line and mix. DRINK all the liquid in the container. You must drink two (2) more 16 ounces containers of water over the next 1 hour.On the day of the procedure, 5 hours before procedure: Pour the contents of the second bottle of Suprep into the mixing container provided and follow the same instructions. 10/18/21   Midge Minium, MD  sodium hypochlorite (DAKIN'S 1/4 STRENGTH)  0.125 % SOLN Apply Dakin's moistened gauze to right leg wounds Q day, then cover with ABD pads.  Wrap with kerlex in a spiral fashion, beginning just behind toes, to below knee, then in the same manner with ace wrap 04/29/21   Sunnie Nielsen, DO  warfarin (COUMADIN) 6 MG tablet Take 6 mg by mouth every evening.    [provider]    Allergies as of 04/21/2022 - Review Complete 06/29/2021  Allergen Reaction Noted   Chlorthalidone Swelling 05/11/2011   Hydralazine  Other (See Comments) 06/22/2011   Lisinopril Swelling 05/21/2012   Metformin and related Other (See Comments) 06/29/2021    Family History  Problem Relation Age of Onset   Diabetes Mother    Diabetes Father    Diabetes Brother     Social History   Socioeconomic History   Marital status: Single    Spouse name: Not on file   Number of children: Not on file   Years of education: Not on file   Highest education level: Not on file  Occupational History   Not on file  Tobacco Use   Smoking status: Never   Smokeless tobacco: Never  Vaping Use   Vaping Use: Never used  Substance and Sexual Activity   Alcohol use: Yes    Alcohol/week: 12.0 standard drinks of alcohol    Types: 12 Cans of beer per week   Drug use: No   Sexual activity: Not on file  Other Topics Concern   Not on file  Social History Narrative   Not on file   Social Determinants of Health   Financial Resource Strain: Not on file  Food Insecurity: Not on file  Transportation Needs: Not on file  Physical Activity: Not on file  Stress: Not on file  Social Connections: Not on file  Intimate Partner Violence: Not on file    Review of Systems: See HPI, otherwise negative ROS  Physical Exam: BP 136/73   Pulse 63   Temp (!) 97.3 F (36.3 C) (Temporal)   Resp 18   Ht 5\' 10"  (1.778 m)   Wt 126.9 kg   SpO2 97%   BMI 40.15 kg/m  General:   Alert,  pleasant and cooperative in NAD Head:  Normocephalic and atraumatic. Neck:  Supple; no masses or thyromegaly. Lungs:  Clear throughout to auscultation.    Heart:  Regular rate and rhythm. Abdomen:  Soft, nontender and nondistended. Normal bowel sounds, without guarding, and without rebound.   Neurologic:  Alert and  oriented x4;  grossly normal neurologically.  Impression/Plan: Shane Bean is here for an colonoscopy to be performed for a history of adenomatous polyps on     Risks, benefits, limitations, and alternatives regarding  colonoscopy have  been reviewed with the patient.  Questions have been answered.  All parties agreeable.   Midge Minium, MD  05/24/2022, 10:45 AM

## 2022-05-25 ENCOUNTER — Encounter: Payer: Self-pay | Admitting: Gastroenterology

## 2022-05-27 LAB — SURGICAL PATHOLOGY

## 2022-05-29 ENCOUNTER — Encounter: Payer: Self-pay | Admitting: Gastroenterology
